# Patient Record
Sex: Male | Born: 1940 | Race: White | Hispanic: No | Marital: Married | Smoking: Former smoker
Health system: Southern US, Community
[De-identification: ages and names within clinical notes are randomized; demographics above are authoritative.]

## PROBLEM LIST (undated history)

## (undated) DIAGNOSIS — C3491 Malignant neoplasm of unspecified part of right bronchus or lung: Secondary | ICD-10-CM

## (undated) DIAGNOSIS — C801 Malignant (primary) neoplasm, unspecified: Secondary | ICD-10-CM

## (undated) DIAGNOSIS — I4819 Other persistent atrial fibrillation: Secondary | ICD-10-CM

## (undated) DIAGNOSIS — T85528A Displacement of other gastrointestinal prosthetic devices, implants and grafts, initial encounter: Secondary | ICD-10-CM

## (undated) DIAGNOSIS — M1990 Unspecified osteoarthritis, unspecified site: Secondary | ICD-10-CM

## (undated) DIAGNOSIS — I471 Supraventricular tachycardia, unspecified: Secondary | ICD-10-CM

## (undated) DIAGNOSIS — I10 Essential (primary) hypertension: Secondary | ICD-10-CM

## (undated) DIAGNOSIS — I509 Heart failure, unspecified: Secondary | ICD-10-CM

## (undated) DIAGNOSIS — R269 Unspecified abnormalities of gait and mobility: Secondary | ICD-10-CM

## (undated) DIAGNOSIS — F329 Major depressive disorder, single episode, unspecified: Secondary | ICD-10-CM

## (undated) DIAGNOSIS — F32A Depression, unspecified: Secondary | ICD-10-CM

## (undated) DIAGNOSIS — I6509 Occlusion and stenosis of unspecified vertebral artery: Secondary | ICD-10-CM

## (undated) DIAGNOSIS — I639 Cerebral infarction, unspecified: Secondary | ICD-10-CM

## (undated) DIAGNOSIS — I2510 Atherosclerotic heart disease of native coronary artery without angina pectoris: Secondary | ICD-10-CM

## (undated) DIAGNOSIS — K589 Irritable bowel syndrome without diarrhea: Secondary | ICD-10-CM

## (undated) DIAGNOSIS — I499 Cardiac arrhythmia, unspecified: Secondary | ICD-10-CM

## (undated) HISTORY — DX: Unspecified abnormalities of gait and mobility: R26.9

## (undated) HISTORY — PX: EYE SURGERY: SHX253

## (undated) HISTORY — DX: Supraventricular tachycardia: I47.1

## (undated) HISTORY — DX: Supraventricular tachycardia, unspecified: I47.10

## (undated) HISTORY — DX: Malignant neoplasm of unspecified part of right bronchus or lung: C34.91

## (undated) HISTORY — PX: CARDIAC CATHETERIZATION: SHX172

## (undated) HISTORY — DX: Occlusion and stenosis of unspecified vertebral artery: I65.09

## (undated) HISTORY — PX: UVULOPALATOPHARYNGOPLASTY: SHX827

## (undated) HISTORY — DX: Cerebral infarction, unspecified: I63.9

## (undated) HISTORY — PX: CATARACT EXTRACTION, BILATERAL: SHX1313

## (undated) HISTORY — PX: NASAL SINUS SURGERY: SHX719

---

## 2000-03-12 HISTORY — PX: FOOT NEUROMA SURGERY: SHX646

## 2003-08-20 ENCOUNTER — Emergency Department (HOSPITAL_COMMUNITY): Admission: EM | Admit: 2003-08-20 | Discharge: 2003-08-20 | Payer: Self-pay | Admitting: Emergency Medicine

## 2004-03-21 ENCOUNTER — Encounter: Admission: RE | Admit: 2004-03-21 | Discharge: 2004-03-21 | Payer: Self-pay | Admitting: Emergency Medicine

## 2004-03-24 ENCOUNTER — Encounter: Admission: RE | Admit: 2004-03-24 | Discharge: 2004-03-24 | Payer: Self-pay | Admitting: Emergency Medicine

## 2004-04-27 ENCOUNTER — Ambulatory Visit (HOSPITAL_COMMUNITY): Admission: RE | Admit: 2004-04-27 | Discharge: 2004-04-27 | Payer: Self-pay | Admitting: Orthopedic Surgery

## 2004-04-27 ENCOUNTER — Ambulatory Visit (HOSPITAL_BASED_OUTPATIENT_CLINIC_OR_DEPARTMENT_OTHER): Admission: RE | Admit: 2004-04-27 | Discharge: 2004-04-27 | Payer: Self-pay | Admitting: Orthopedic Surgery

## 2004-10-02 ENCOUNTER — Encounter: Admission: RE | Admit: 2004-10-02 | Discharge: 2004-10-02 | Payer: Self-pay | Admitting: Emergency Medicine

## 2005-03-23 ENCOUNTER — Inpatient Hospital Stay (HOSPITAL_COMMUNITY): Admission: RE | Admit: 2005-03-23 | Discharge: 2005-03-24 | Payer: Self-pay | Admitting: Orthopedic Surgery

## 2005-06-25 ENCOUNTER — Ambulatory Visit: Payer: Self-pay | Admitting: Gastroenterology

## 2005-07-05 ENCOUNTER — Encounter (INDEPENDENT_AMBULATORY_CARE_PROVIDER_SITE_OTHER): Payer: Self-pay | Admitting: *Deleted

## 2005-07-05 ENCOUNTER — Ambulatory Visit: Payer: Self-pay | Admitting: Gastroenterology

## 2007-10-02 ENCOUNTER — Ambulatory Visit (HOSPITAL_BASED_OUTPATIENT_CLINIC_OR_DEPARTMENT_OTHER): Admission: RE | Admit: 2007-10-02 | Discharge: 2007-10-02 | Payer: Self-pay | Admitting: Orthopedic Surgery

## 2007-11-27 ENCOUNTER — Encounter: Admission: RE | Admit: 2007-11-27 | Discharge: 2007-11-27 | Payer: Self-pay | Admitting: Emergency Medicine

## 2008-04-28 ENCOUNTER — Encounter (INDEPENDENT_AMBULATORY_CARE_PROVIDER_SITE_OTHER): Payer: Self-pay | Admitting: *Deleted

## 2008-06-02 ENCOUNTER — Ambulatory Visit: Payer: Self-pay | Admitting: Gastroenterology

## 2008-06-15 ENCOUNTER — Encounter: Payer: Self-pay | Admitting: Gastroenterology

## 2008-06-15 ENCOUNTER — Ambulatory Visit: Payer: Self-pay | Admitting: Gastroenterology

## 2008-06-16 ENCOUNTER — Encounter: Payer: Self-pay | Admitting: Gastroenterology

## 2008-09-23 ENCOUNTER — Encounter: Admission: RE | Admit: 2008-09-23 | Discharge: 2008-09-23 | Payer: Self-pay | Admitting: Emergency Medicine

## 2008-10-04 ENCOUNTER — Encounter: Admission: RE | Admit: 2008-10-04 | Discharge: 2008-10-04 | Payer: Self-pay | Admitting: Emergency Medicine

## 2008-10-12 ENCOUNTER — Ambulatory Visit: Payer: Self-pay

## 2008-10-12 ENCOUNTER — Encounter (INDEPENDENT_AMBULATORY_CARE_PROVIDER_SITE_OTHER): Payer: Self-pay | Admitting: Emergency Medicine

## 2008-10-13 ENCOUNTER — Ambulatory Visit: Payer: Self-pay | Admitting: Cardiovascular Disease

## 2008-10-13 ENCOUNTER — Encounter (INDEPENDENT_AMBULATORY_CARE_PROVIDER_SITE_OTHER): Payer: Self-pay | Admitting: *Deleted

## 2008-10-13 DIAGNOSIS — R9389 Abnormal findings on diagnostic imaging of other specified body structures: Secondary | ICD-10-CM

## 2008-10-13 DIAGNOSIS — R9431 Abnormal electrocardiogram [ECG] [EKG]: Secondary | ICD-10-CM | POA: Insufficient documentation

## 2008-10-13 DIAGNOSIS — F341 Dysthymic disorder: Secondary | ICD-10-CM | POA: Insufficient documentation

## 2008-10-13 LAB — CONVERTED CEMR LAB
Basophils Absolute: 0.1 10*3/uL (ref 0.0–0.1)
CO2: 31 meq/L (ref 19–32)
Calcium: 9.2 mg/dL (ref 8.4–10.5)
Creatinine, Ser: 0.8 mg/dL (ref 0.4–1.5)
Eosinophils Absolute: 1.1 10*3/uL — ABNORMAL HIGH (ref 0.0–0.7)
GFR calc non Af Amer: 102.24 mL/min (ref >60)
Glucose, Bld: 106 mg/dL — ABNORMAL HIGH (ref 70–99)
INR: 1 (ref 0.8–1.0)
Lymphocytes Relative: 23.6 % (ref 12.0–46.0)
MCHC: 33.6 g/dL (ref 30.0–36.0)
Monocytes Relative: 8.2 % (ref 3.0–12.0)
Neutrophils Relative %: 53.8 % (ref 43.0–77.0)
Platelets: 244 10*3/uL (ref 150.0–400.0)
Prothrombin Time: 10.5 s (ref 9.1–11.7)
RDW: 12.7 % (ref 11.5–14.6)
Sodium: 141 meq/L (ref 135–145)
aPTT: 27.5 s (ref 21.7–28.8)

## 2008-10-14 ENCOUNTER — Ambulatory Visit: Payer: Self-pay | Admitting: Cardiovascular Disease

## 2008-10-14 ENCOUNTER — Inpatient Hospital Stay (HOSPITAL_BASED_OUTPATIENT_CLINIC_OR_DEPARTMENT_OTHER): Admission: RE | Admit: 2008-10-14 | Discharge: 2008-10-14 | Payer: Self-pay | Admitting: Cardiovascular Disease

## 2008-10-26 ENCOUNTER — Telehealth (INDEPENDENT_AMBULATORY_CARE_PROVIDER_SITE_OTHER): Payer: Self-pay | Admitting: *Deleted

## 2008-11-03 DIAGNOSIS — F3289 Other specified depressive episodes: Secondary | ICD-10-CM | POA: Insufficient documentation

## 2008-11-03 DIAGNOSIS — F329 Major depressive disorder, single episode, unspecified: Secondary | ICD-10-CM | POA: Insufficient documentation

## 2008-11-04 ENCOUNTER — Ambulatory Visit: Payer: Self-pay | Admitting: Cardiovascular Disease

## 2008-11-04 DIAGNOSIS — I2510 Atherosclerotic heart disease of native coronary artery without angina pectoris: Secondary | ICD-10-CM | POA: Insufficient documentation

## 2009-03-17 ENCOUNTER — Encounter (INDEPENDENT_AMBULATORY_CARE_PROVIDER_SITE_OTHER): Payer: Self-pay | Admitting: *Deleted

## 2010-04-11 NOTE — Letter (Signed)
Summary: Appointment - Reminder 2  [employer], Main Office  [address]   [place], [place] [address]   Phone: [phone]  Fax: [phone]     March 17, 2009 MRN: [id number]   George Vega [address] [place], [place]  [address]   Dear George Vega,  Our records indicate that it is time to schedule a follow-up appointment with Dr. Eden Emms. It is very important that we reach you to schedule this appointment. We look forward to participating in your health care needs. Please contact us at the number listed above at your earliest convenience to schedule your appointment.  If you are unable to make an appointment at this time, give [place] a call so we can update our records.  Sincerely,   Migdalia Dk [facility] Scheduling Team

## 2010-07-25 NOTE — Op Note (Signed)
NAME:  George Vega, George Vega              ACCOUNT NO.:  [id number]   MEDICAL RECORD NO.:  [id number]          PATIENT TYPE:  AMB   LOCATION:  DSC                          FACILITY:  [facility]   PHYSICIAN:  Loreta Ave, M.D. 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   POSTOPERATIVE DIAGNOSES:  Left knee complex tearing medial greater than  lateral meniscus.  Grade 2 and 3 changes tricompartmental most marked  patellofemoral joint with chondral loose bodies.   PROCEDURE:  Left knee exam under anesthesia, arthroscopy, chondroplasty  primarily patellofemoral joint, removal of chondral loose bodies.  Partial medial and lateral meniscectomy.   SURGEON:  Loreta Ave, MD   ASSISTANT:  Genene Churn. Barry Dienes, [place]   ANESTHESIA:  Knee block with sedation.   SPECIMENS:  None.   CULTURES:  None.   COMPLICATIONS:  None.   DRESSINGS:  Soft compressive.   PROCEDURE:  The patient was brought to the operating room, placed on the  operating table in supine position.  After adequate anesthesia had been  obtained, knee examined.  Good motion and good stability, patellofemoral  crepitus, positive medial McMurray, stable ligaments.  Tourniquet leg  holder applied.  Leg prepped and draped in the usual sterile fashion.  Three portals were created, one superolateral, one each medial and  lateral parapatellar.  Inflow catheter induced.  The standard  arthroscope was induced and knee inspected.  Good patellofemoral  tracking, but diffuse grade 3 changes throughout the patella and grade 2  on the trochlea.  Chondroplasty to a stable surface.  Loose bodies  removed.  Cruciate ligaments intact.  The medial compartment grade 2  changes mostly on the condyle.  Debrided.  Complex tearing of posterior  horn of the medial meniscus.  Posterior horn  removed, tapered into  remaining meniscus.  On the lateral side, there was some degenerative  tearing of posterior horn lateral meniscus.  Saucerized out to a stable  rim, tapered in smoothly.  Grade 2 changes on the plateau debrided.  Entire knee  examined.  No other findings appreciated.  Instruments and fluid  removed.  Portals and knee injected with Marcaine.  Portals closed with  4-0 nylon.  Sterile compressive dressing applied.  Anesthesia reversed.  Brought to the recovery room.  He tolerated his surgery well.  No  complications.      Loreta Ave, M.D.  Electronically Signed     DFM/MEDQ  D:  10/02/2007  T:  10/03/2007  Job:  [id number]

## 2010-07-25 NOTE — Cardiovascular Report (Signed)
NAME:  George Vega, George Vega              ACCOUNT NO.:  [id number]   MEDICAL RECORD NO.:  [id number]          PATIENT TYPE:  OIB   LOCATION:  1961                         FACILITY:  [facility]   PHYSICIAN:  Peter C. Eden Emms, MD, FACCDATE OF BIRTH:  17-Jul-1940   DATE OF PROCEDURE:  DATE OF DISCHARGE:                            CARDIAC CATHETERIZATION   A 70 year old patient with positive stress echo suggesting LAD ischemia.   Cine catheterization done with 4-French catheters from right femoral  artery.   The distal left main coronary artery had a 30% discrete stenosis.   The left anterior descending artery was 100% occluded in the midvessel,  it was 100% chronically occluded just after the takeoff of the first  diagonal and first septal perforator.  First diagonal branch had 20%  multiple discrete lesions.   Circumflex coronary artery had a 50% ostial lesion, subsequently there  was a 30% to 40% discrete lesion in the proximal vessel.  The OM  consisted primarily of a large obtuse marginal branch, which was normal.   The right coronary artery was large.  There was 20% multiple lesions  proximally.  Mid distal vessel were normal.  Posterior lateral branch  and PDA were normal.  There was a very well formed collaterals to the  distal, mid, and proximal LAD from the PDA.   RAO ventriculography.  RAO ventriculography was normal.  EF was 55% to  60%.  There was no regional wall motion abnormality.  Blood pressure was  145/80.  LV pressure was 145/12.   IMPRESSION:  The patient has a chronically occluded LAD with normal LV  function and no evidence of angina.  His positive stress echo would  indicate a collateralized LAD, I think this is a stable situation.  I  did not think that the ostial circ was flow-limiting.  I believe medical  therapy is warranted including aspirin and beta-blocker therapy.      Noralyn Pick. Eden Emms, MD, [facility]  Electronically Signed     PCN/MEDQ  D:  10/14/2008  T:   10/14/2008  Job:  [id number]   cc:   Reuben Likes, M.D.

## 2010-07-28 NOTE — Op Note (Signed)
NAMEJIOVANI, George Vega              ACCOUNT NO.:  [id number]   MEDICAL RECORD NO.:  [id number]          PATIENT TYPE:  INP   LOCATION:  5015                         FACILITY:  [facility]   PHYSICIAN:  Loreta Ave, M.D. DATE OF BIRTH:  1940-06-18   DATE OF PROCEDURE:  03/23/2005  DATE OF DISCHARGE:                                 OPERATIVE REPORT   PREOPERATIVE DIAGNOSIS:  End stage degenerative arthritis, left shoulder.   POSTOPERATIVE DIAGNOSIS:  End stage degenerative arthritis, left shoulder.   OPERATIVE PROCEDURE:  Total shoulder replacement, left shoulder, utilizing  Stryker Osteonix prosthesis, cemented pegged number 7 glenoid component,  press fit number 16 HA coated stem with a 50 by 18 mm 4 mm offset humeral  head.   SURGEON:  Loreta Ave, M.D.   ASSISTANT:   ANESTHESIA:  General.   ESTIMATED BLOOD LOSS:  Minimal.   SPECIMENS:  None.   CULTURES:  None.   COMPLICATIONS:  None.   DRESSINGS:  Soft compressive with shoulder immobilizer.   PROCEDURE:  The patient was brought to the operating room and after adequate  anesthesia had been obtained, placed in a beach chair position on the  shoulder positioner, prepped and draped in the usual sterile fashion.  Very  good motion, good stability.  Incision along the deltopectoral interval from  the coracoid distally.  Skin and subcutaneous tissue divided.  Blunt  dissection used to develop the deltopectoral interval preserving the  cephalic vein.  The front of the shoulder exposed.  The subscap tendon was  taken down, tagged with Ethibond, exposing the shoulder.  Grade 4 changes  throughout.  Humeral head was cut at the anatomic neck at 30 degrees of  retroversion protecting the rotator cuff, biceps tendon, capsule and  ligamentous structures.  The shoulder exposed.  Some periarticular spurs  removed.  Glenoid inspected, also with some focal grade 4 changes.  Glenoid  prepared with sizing for a number 7 component.   The reamer was then used to  ream down to good, bleeding bone restoring normal position of the glenoid  which had eroded slightly posteriorly.  After sizing for the number 7  component, the jigs were put in place, the drill holes made for the PEG  component.  Copious irrigation.  Cement prepared and placed on the peg  component which was firmly cemented with excellent positioning and coverage  of the glenoid with a number 7 component.  After the cement hardened,  attention was turned to the humerus.  Hand held reamer was used to open up  the canal and sized for a number 16 mm component restoring 30 degrees of  retroversion.  After appropriate trials, a 50 mm by 18 mm head was chosen.  Utilizing the 4 mm offset, I could center the head perfectly over the top of  the humerus.  The trials were removed.  Definitive component was then seated  down in the humerus restoring good retroversion and seating.  The head was  then attached with the 4 mm offset placed so I had an excellent coverage of  the head.  This was inspected with good coverage of the head throughout,  good positioning of the humeral head, and good restoration of the anatomy.  The shoulder reduced.  Full passive motion and excellent stability.  Biceps  tendon and rotator cuff intact.  Copious irrigation.  Subscap was repaired  anatomically with Ethibond.  The deltopectoral interval closed with 0  Vicryl.  The skin and subcutaneous tissue with Vicryl and then staples.  Sterile compressive dressing applied.  Shoulder immobilizer applied.  Anesthesia reversed.  Brought to the recovery room.  Tolerated the surgery  well without complications.      Loreta Ave, M.D.  Electronically Signed     DFM/MEDQ  D:  03/23/2005  T:  03/24/2005  Job:  [id number]

## 2010-07-28 NOTE — Op Note (Signed)
NAME:  George Vega, George Vega              ACCOUNT NO.:  [id number]   MEDICAL RECORD NO.:  [id number]          PATIENT TYPE:  AMB   LOCATION:  DSC                          FACILITY:  [facility]   PHYSICIAN:  Loreta Ave, M.D. 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   POSTOPERATIVE DIAGNOSES:  Persistent impingement, left shoulder with marked  distal clavicle osteolysis and partial tearing of rotator cuff after  previous arthroscopy in 2004 with focal grade 4 chondral lesion central  portion of the humeral head with chondral loose bodies.   PROCEDURE:  1.  Left shoulder exam under anesthesia, arthroscopy with chondroplasty of      humeral head and removal of chondral loose bodies.  2.  Subacromial decompression with debridement of the cuff, resection of      bursa and adhesions.  3.  Revision acromioplasty acromion and release of coracoacromial ligament.  4.  Resection of periarticular spurs distal clavicle.   SURGEON:  Loreta Ave, M.D.   ASSISTANT:  Genene Churn. Denton Meek.   ANESTHESIA:  General.   BLOOD LOSS:  Minimal.   SPECIMENS:  None.   CULTURES:  None.   COMPLICATIONS:  None.   DRESSINGS:  Soft compression dressing.   DESCRIPTION OF PROCEDURE:  The patient brought to the operating room and  after adequate anesthesia had been obtained, the left shoulder was examined.  Full motion good stability, no real gradient or crepitus.  Placed in beach-  chair position on McConnell positioner and prepped and draped in usual  sterile fashion.  Three standard portals, anterior, posterior and lateral.  Shoulder was entered with blunt obturator __________ and inspected.  Noted  to have significant chondral loose bodies  throughout, all debrided.  These  were coming from a focal grade 4 lesion right in the center of the humeral  head.  That was debrided to a stable surface.  This was very focal, about  1.5 cm in diameter, but remaining articular cartilage looked excellent.  Biceps tendon, biceps anchor, labrum intact.  All recesses examined, all  loose fragments removed.  Undersurface of rotator cuff looked good.  Cannula  redirected subacromially.  Persistent impingement with fibrillated abrasive  tearing supraspinatus, especially anterior half.  Treated with debridement  to a stable surface.  Although a lot of attrition and abrasion, no full-  thickness tears.  Persistent spurring in the front of the acromion treated  with revision acromioplasty with release of CA ligament with cautery and use  of high-speed bur to get a nice upsloping on top of the acromion.  Distal  clavicle grade 4 changes with marked osteophytes and some periarticular  spurs.  All spurs removed.  Resection of the lateral 1 cm of the clavicle.  At completion, adequacy of decompression and clavicle excision confirmed  viewing from all portals.  Thorough assessment of the cuff  throughout.  Instruments and fluid removed.  Portals in shoulder injected with Marcaine.  Portals closed with 4-0 nylon.  Sterile compression dressing applied.  Anesthesia reversed.  Brought to the recovery room.  Tolerated surgery well,  no complications.      DFM/MEDQ  D:  04/27/2004  T:  04/27/2004  Job:  [id number]

## 2011-02-09 ENCOUNTER — Other Ambulatory Visit: Payer: Self-pay | Admitting: Orthopedic Surgery

## 2011-02-09 DIAGNOSIS — M25511 Pain in right shoulder: Secondary | ICD-10-CM

## 2011-02-13 ENCOUNTER — Ambulatory Visit
Admission: RE | Admit: 2011-02-13 | Discharge: 2011-02-13 | Disposition: A | Discharge status: Home / Self Care | Payer: Medicare Other | Source: Ambulatory Visit | Attending: Orthopedic Surgery | Admitting: Orthopedic Surgery

## 2011-02-13 DIAGNOSIS — M25511 Pain in right shoulder: Secondary | ICD-10-CM

## 2011-02-26 NOTE — H&P (Signed)
George Vega/[facility] [address]   [address] [place], [place] [address] [phone] A Division of [facility]  George Vega, M.D.     George Vega, M.D.     George Vega, M.D. George Vega, M.D.    George Vega, M.D. George Vega, M.D. George Vega, D.O.          George Vega. George Dienes, PA-C            George A. Shepperson, PA-C George Vega, OPA-C   RE: George Vega, George Vega   [id number]      DOB: 06/30/1940 PROGRESS NOTE: 02-09-11 George Vega comes in for his right shoulder. Old patient of mine. Right shoulder symptoms for 2 years. Getting steadily worse. Insidious onset. Progressed to a point that he can no longer do anything overhead. Rest pain and night pain marked functional impact. He's given this time, rest, alteration of activity and antiinflammatories without improvement. He comes in for evaluation and treatment recommendation. No instability. Past medical history: reviewed included in the chart. Significant for marked issues in his left shoulder initially impingement treated with arthroscopic decompression and attendant end stage degenerative arthritis treated with total shoulder replacement  by me 03/23/05. Excellent results with full function full motion resolution of symptoms and doing well. He's been seen by me for arthroscopic debridement meniscus tears left knee 10/02/07. Resolution of symptoms there doing well. He's followed medically by Dr. Leslee Vega. He's on aspirin and Plavix for cardiac issues. Cortisone injection to his left shoulder and left knee never gave him long-term relief. General exam is outlined included in the chart.   EXAMINATION: Healthy appearing 70 year old in no acute distress. Normal gait and stance. Right shoulder has just about full motion passively a little less actively. Positive impingement positive palms down abduction. AC soreness. Pain with crossed adduction. Biceps intact. No  apprehension or instability. No significant rotator cuff atrophy. Left shoulder has full motion no pain no tenderness no impingement.  X-RAYS: 3 views right shoulder shows type II to III acromion degenerative changes AC joint. The glenohumeral joint and subacromial space look good. He does not have near the arthritis in the shoulder that he had on the other side.  DISPOSITION: Progressive impingement right shoulder. Present for 2 years. Could have progression to an attritional rotator cuff tear. Talked about options. Given that he's had symptoms for 2 years we'll obtain an MRI to outline pathology. We talked about subacromial decompression versus combination of that with rotator cuff repair depending on what the scan shows. He understands and agrees.  I spent more than 25 minutes with him covering all these issues with him. All paperwork complete and questions answered. If we proceed with surgery we'll need clearance from Dr. Lorenz Vega and he'll need to be off Plavix.  George Vega, M.D.  Electronically verified by George Vega, M.D. DFM:kh cc:  George Home, MD fax [phone]  D 02-09-11 T 02-12-11  George Vega/[facility] [address]   [address] [place], [place] [address] [phone] A Division of [facility]  George Vega, M.D.     George Vega, M.D.     George Vega, M.D. George Vega, M.D.    George Vega, M.D. George Vega, M.D. George Vega, D.O.          George Vega. George Dienes, PA-C            George A. Shepperson,  PA-C George Vega, OPA-C   RE: George Vega, George Vega   [id number]      DOB: 02-12-1941 PROGRESS NOTE: 02-16-11 I spoke with George Vega about his right shoulder MRI performed 12/4. This showed intact rotator cuff hypertrophy AC joint which could predispose to impingement and moderate osteoarthritic changes in the glenoid with debris in the joint fluid. He's scheduled for right shoulder arthroscopy and  we'll proceed as scheduled.  George Vega, M.D.  Electronically verified by George Vega, M.D. DFM(JMO):kh D 02-19-11 T 02-19-11

## 2011-02-28 ENCOUNTER — Encounter (HOSPITAL_BASED_OUTPATIENT_CLINIC_OR_DEPARTMENT_OTHER): Payer: Self-pay | Admitting: *Deleted

## 2011-02-28 NOTE — Progress Notes (Signed)
Pt has chronic sinus problems-started on antibiotic preop to prevent any problems for surg No cardiac meds -denies any resp problems

## 2011-03-01 ENCOUNTER — Encounter (HOSPITAL_BASED_OUTPATIENT_CLINIC_OR_DEPARTMENT_OTHER): Payer: Self-pay | Admitting: *Deleted

## 2011-03-01 ENCOUNTER — Encounter (HOSPITAL_BASED_OUTPATIENT_CLINIC_OR_DEPARTMENT_OTHER)
Admission: RE | Disposition: A | Discharge status: Home / Self Care | Payer: Self-pay | Source: Ambulatory Visit | Attending: Orthopedic Surgery

## 2011-03-01 ENCOUNTER — Ambulatory Visit (HOSPITAL_BASED_OUTPATIENT_CLINIC_OR_DEPARTMENT_OTHER): Payer: Medicare Other | Admitting: Anesthesiology

## 2011-03-01 ENCOUNTER — Encounter (HOSPITAL_BASED_OUTPATIENT_CLINIC_OR_DEPARTMENT_OTHER): Payer: Self-pay | Admitting: Anesthesiology

## 2011-03-01 ENCOUNTER — Ambulatory Visit (HOSPITAL_BASED_OUTPATIENT_CLINIC_OR_DEPARTMENT_OTHER)
Admission: RE | Admit: 2011-03-01 | Discharge: 2011-03-01 | Disposition: A | Discharge status: Home / Self Care | Payer: Medicare Other | Source: Ambulatory Visit | Attending: Orthopedic Surgery | Admitting: Orthopedic Surgery

## 2011-03-01 DIAGNOSIS — M25819 Other specified joint disorders, unspecified shoulder: Secondary | ICD-10-CM | POA: Insufficient documentation

## 2011-03-01 DIAGNOSIS — M19019 Primary osteoarthritis, unspecified shoulder: Secondary | ICD-10-CM | POA: Insufficient documentation

## 2011-03-01 DIAGNOSIS — Z01812 Encounter for preprocedural laboratory examination: Secondary | ICD-10-CM | POA: Insufficient documentation

## 2011-03-01 DIAGNOSIS — I2510 Atherosclerotic heart disease of native coronary artery without angina pectoris: Secondary | ICD-10-CM | POA: Insufficient documentation

## 2011-03-01 DIAGNOSIS — Z4789 Encounter for other orthopedic aftercare: Secondary | ICD-10-CM

## 2011-03-01 HISTORY — DX: Irritable bowel syndrome, unspecified: K58.9

## 2011-03-01 HISTORY — PX: SHOULDER ARTHROSCOPY: SHX128

## 2011-03-01 HISTORY — DX: Unspecified osteoarthritis, unspecified site: M19.90

## 2011-03-01 SURGERY — ARTHROSCOPY, SHOULDER
Anesthesia: General | Site: Shoulder | Laterality: Right | Wound class: Clean

## 2011-03-01 MED ORDER — LACTATED RINGERS IV SOLN
INTRAVENOUS | Status: DC
  Administered 2011-03-01 (×3): via INTRAVENOUS

## 2011-03-01 MED ORDER — LIDOCAINE HCL (CARDIAC) 20 MG/ML IV SOLN
INTRAVENOUS | Status: DC | PRN
  Administered 2011-03-01: 100 mg via INTRAVENOUS

## 2011-03-01 MED ORDER — FENTANYL CITRATE 0.05 MG/ML IJ SOLN
50.0000 ug | INTRAMUSCULAR | Status: DC | PRN
  Administered 2011-03-01: 100 ug via INTRAVENOUS

## 2011-03-01 MED ORDER — SUCCINYLCHOLINE CHLORIDE 20 MG/ML IJ SOLN
INTRAMUSCULAR | Status: DC | PRN
  Administered 2011-03-01: 100 mg via INTRAVENOUS

## 2011-03-01 MED ORDER — HYDROMORPHONE HCL PF 1 MG/ML IJ SOLN
0.2500 mg | INTRAMUSCULAR | Status: DC | PRN
  Administered 2011-03-01: 0.5 mg via INTRAVENOUS

## 2011-03-01 MED ORDER — CEFAZOLIN SODIUM-DEXTROSE 2-3 GM-% IV SOLR
2.0000 g | INTRAVENOUS | Status: AC
  Administered 2011-03-01: 2 g via INTRAVENOUS

## 2011-03-01 MED ORDER — MEPERIDINE HCL 25 MG/ML IJ SOLN: 6.2500 mg | INTRAMUSCULAR | Status: DC | PRN

## 2011-03-01 MED ORDER — FENTANYL CITRATE 0.05 MG/ML IJ SOLN
INTRAMUSCULAR | Status: DC | PRN
  Administered 2011-03-01 (×2): 50 ug via INTRAVENOUS

## 2011-03-01 MED ORDER — SODIUM CHLORIDE 0.9 % IR SOLN
Status: DC | PRN
  Administered 2011-03-01: 3000 mL

## 2011-03-01 MED ORDER — DEXAMETHASONE SODIUM PHOSPHATE 4 MG/ML IJ SOLN
INTRAMUSCULAR | Status: DC | PRN
  Administered 2011-03-01: 10 mg via INTRAVENOUS

## 2011-03-01 MED ORDER — BUPIVACAINE-EPINEPHRINE PF 0.25-1:200000 % IJ SOLN
INTRAMUSCULAR | Status: DC | PRN
  Administered 2011-03-01: 25 mL

## 2011-03-01 MED ORDER — PROPOFOL 10 MG/ML IV EMUL
INTRAVENOUS | Status: DC | PRN
  Administered 2011-03-01: 200 mg via INTRAVENOUS

## 2011-03-01 MED ORDER — LABETALOL HCL 5 MG/ML IV SOLN
10.0000 mg | INTRAVENOUS | Status: DC | PRN
  Administered 2011-03-01: 10 mg via INTRAVENOUS

## 2011-03-01 MED ORDER — MIDAZOLAM HCL 2 MG/2ML IJ SOLN
1.0000 mg | INTRAMUSCULAR | Status: DC | PRN
  Administered 2011-03-01: 2 mg via INTRAVENOUS

## 2011-03-01 MED ORDER — ONDANSETRON HCL 4 MG/2ML IJ SOLN
INTRAMUSCULAR | Status: DC | PRN
  Administered 2011-03-01: 4 mg via INTRAVENOUS

## 2011-03-01 MED ORDER — PROMETHAZINE HCL 25 MG/ML IJ SOLN: 6.2500 mg | INTRAMUSCULAR | Status: DC | PRN

## 2011-03-01 MED ORDER — CEFAZOLIN SODIUM 1-5 GM-% IV SOLN: 1.0000 g | INTRAVENOUS | Status: DC

## 2011-03-01 MED ORDER — EPHEDRINE SULFATE 50 MG/ML IJ SOLN
INTRAMUSCULAR | Status: DC | PRN
  Administered 2011-03-01: 10 mg via INTRAVENOUS
  Administered 2011-03-01: 5 mg via INTRAVENOUS
  Administered 2011-03-01: 10 mg via INTRAVENOUS

## 2011-03-01 MED ORDER — CHLORHEXIDINE GLUCONATE 4 % EX LIQD: 60.0000 mL | Freq: Once | CUTANEOUS | Status: DC

## 2011-03-01 SURGICAL SUPPLY — 74 items
APL SKNCLS STERI-STRIP NONHPOA (GAUZE/BANDAGES/DRESSINGS)
BENZOIN TINCTURE PRP APPL 2/3 (GAUZE/BANDAGES/DRESSINGS) IMPLANT
BLADE CUTTER GATOR 3.5 (BLADE) ×3 IMPLANT
BLADE CUTTER MENIS 5.5 (BLADE) IMPLANT
BLADE GREAT WHITE 4.2 (BLADE) ×3 IMPLANT
BLADE SURG 15 STRL LF DISP TIS (BLADE) IMPLANT
BLADE SURG 15 STRL SS (BLADE)
BUR OVAL 6.0 (BURR) ×3 IMPLANT
CANISTER OMNI JUG 16 LITER (MISCELLANEOUS) ×3 IMPLANT
CANISTER SUCTION 2500CC (MISCELLANEOUS) IMPLANT
CANNULA TWIST IN 8.25X7CM (CANNULA) IMPLANT
CLOTH BEACON ORANGE TIMEOUT ST (SAFETY) ×3 IMPLANT
DECANTER SPIKE VIAL GLASS SM (MISCELLANEOUS) IMPLANT
DRAPE OEC MINIVIEW 54X84 (DRAPES) IMPLANT
DRAPE STERI 35X30 U-POUCH (DRAPES) ×3 IMPLANT
DRAPE U-SHAPE 47X51 STRL (DRAPES) ×3 IMPLANT
DRAPE U-SHAPE 76X120 STRL (DRAPES) ×6 IMPLANT
DRSG PAD ABDOMINAL 8X10 ST (GAUZE/BANDAGES/DRESSINGS) ×3 IMPLANT
DURAPREP 26ML APPLICATOR (WOUND CARE) ×3 IMPLANT
ELECT MENISCUS 165MM 90D (ELECTRODE) ×3 IMPLANT
ELECT NDL TIP 2.8 STRL (NEEDLE) IMPLANT
ELECT NEEDLE TIP 2.8 STRL (NEEDLE) IMPLANT
ELECT REM PT RETURN 9FT ADLT (ELECTROSURGICAL) ×3
ELECTRODE REM PT RTRN 9FT ADLT (ELECTROSURGICAL) ×2 IMPLANT
GAUZE XEROFORM 1X8 LF (GAUZE/BANDAGES/DRESSINGS) ×3 IMPLANT
GLOVE BIO SURGEON STRL SZ 6.5 (GLOVE) ×2 IMPLANT
GLOVE BIOGEL PI IND STRL 7.0 (GLOVE) ×1 IMPLANT
GLOVE BIOGEL PI IND STRL 8 (GLOVE) ×2 IMPLANT
GLOVE BIOGEL PI INDICATOR 7.0 (GLOVE) ×1
GLOVE BIOGEL PI INDICATOR 8 (GLOVE) ×1
GLOVE ORTHO TXT STRL SZ7.5 (GLOVE) ×6 IMPLANT
GOWN BRE IMP PREV XXLGXLNG (GOWN DISPOSABLE) ×3 IMPLANT
GOWN PREVENTION PLUS XLARGE (GOWN DISPOSABLE) ×6 IMPLANT
KIT BIO-TENODESIS 3X8 DISP (MISCELLANEOUS)
KIT INSRT BABSR STRL DISP BTN (MISCELLANEOUS) IMPLANT
NDL SCORPION MULTI FIRE (NEEDLE) IMPLANT
NDL SUT 6 .5 CRC .975X.05 MAYO (NEEDLE) IMPLANT
NEEDLE MAYO TAPER (NEEDLE)
NEEDLE SCORPION MULTI FIRE (NEEDLE) IMPLANT
NS IRRIG 1000ML POUR BTL (IV SOLUTION) IMPLANT
PACK ARTHROSCOPY DSU (CUSTOM PROCEDURE TRAY) ×3 IMPLANT
PACK BASIN DAY SURGERY FS (CUSTOM PROCEDURE TRAY) ×3 IMPLANT
PASSER SUT SWANSON 36MM LOOP (INSTRUMENTS) IMPLANT
PENCIL BUTTON HOLSTER BLD 10FT (ELECTRODE) ×3 IMPLANT
SET ARTHROSCOPY TUBING (MISCELLANEOUS) ×3
SET ARTHROSCOPY TUBING LN (MISCELLANEOUS) ×2 IMPLANT
SLEEVE SCD COMPRESS KNEE MED (MISCELLANEOUS) IMPLANT
SLING ARM FOAM STRAP LRG (SOFTGOODS) IMPLANT
SLING ARM FOAM STRAP MED (SOFTGOODS) IMPLANT
SLING ARM FOAM STRAP XLG (SOFTGOODS) ×2 IMPLANT
SLING ARM IMMOBILIZER LRG (SOFTGOODS) IMPLANT
SLING ARM IMMOBILIZER MED (SOFTGOODS) IMPLANT
SPONGE GAUZE 4X4 12PLY (GAUZE/BANDAGES/DRESSINGS) ×4 IMPLANT
SPONGE LAP 4X18 X RAY DECT (DISPOSABLE) IMPLANT
STRIP CLOSURE SKIN 1/2X4 (GAUZE/BANDAGES/DRESSINGS) IMPLANT
SUCTION FRAZIER TIP 10 FR DISP (SUCTIONS) IMPLANT
SUT ETHIBOND 2 OS 4 DA (SUTURE) IMPLANT
SUT ETHILON 2 0 FS 18 (SUTURE) IMPLANT
SUT ETHILON 3 0 PS 1 (SUTURE) ×2 IMPLANT
SUT FIBERWIRE #2 38 T-5 BLUE (SUTURE)
SUT RETRIEVER MED (INSTRUMENTS) IMPLANT
SUT STEEL 4 (SUTURE) IMPLANT
SUT STEEL 5 (SUTURE) IMPLANT
SUT TIGER TAPE 7 IN WHITE (SUTURE) IMPLANT
SUT VIC AB 0 CT1 27 (SUTURE)
SUT VIC AB 0 CT1 27XBRD ANBCTR (SUTURE) IMPLANT
SUT VIC AB 2-0 SH 27 (SUTURE)
SUT VIC AB 2-0 SH 27XBRD (SUTURE) IMPLANT
SUT VIC AB 3-0 FS2 27 (SUTURE) IMPLANT
SUTURE FIBERWR #2 38 T-5 BLUE (SUTURE) IMPLANT
TAPE FIBER 2MM 7IN #2 BLUE (SUTURE) IMPLANT
TOWEL OR 17X24 6PK STRL BLUE (TOWEL DISPOSABLE) ×3 IMPLANT
WATER STERILE IRR 1000ML POUR (IV SOLUTION) ×3 IMPLANT
YANKAUER SUCT BULB TIP NO VENT (SUCTIONS) IMPLANT

## 2011-03-01 NOTE — Transfer of Care (Signed)
Immediate Anesthesia Transfer of Care Note  Patient: George Vega  Procedure(s) Performed:  ARTHROSCOPY SHOULDER - arthroscopy shoulder decompression subacromial partial acromioplasty with coracoacromial release, distal claviculectomy, debridement of labrium  Patient Location: PACU  Anesthesia Type: General  Level of Consciousness: sedated  Airway & Oxygen Therapy: Patient Spontanous Breathing and Patient connected to face mask oxygen  Post-op Assessment: Report given to PACU RN and Post -op Vital signs reviewed and stable  Post vital signs: Reviewed and stable  Complications: No apparent anesthesia complications

## 2011-03-01 NOTE — Anesthesia Postprocedure Evaluation (Signed)
Anesthesia Post Note  Patient: George Vega  Procedure(s) Performed:  ARTHROSCOPY SHOULDER - arthroscopy shoulder decompression subacromial partial acromioplasty with coracoacromial release, distal claviculectomy, debridement of labrium  Anesthesia type: General  Patient location: PACU  Post pain: Pain level controlled and Adequate analgesia  Post assessment: Post-op Vital signs reviewed, Patient's Cardiovascular Status Stable, Respiratory Function Stable, Patent Airway and Pain level controlled  Last Vitals:  Filed Vitals:   03/01/11 1415  BP: 185/111  Pulse: 108  Temp:   Resp: 18    Post vital signs: Reviewed and stable  Level of consciousness: awake, alert  and oriented  Complications: No apparent anesthesia complications

## 2011-03-01 NOTE — Anesthesia Procedure Notes (Addendum)
Anesthesia Regional Block:  Interscalene brachial plexus block  Pre-Anesthetic Checklist: ,, timeout performed, Correct Patient, Correct Site, Correct Laterality, Correct Procedure, Correct Position, site marked, Risks and benefits discussed, at surgeon's request and post-op pain management  Laterality: Upper and Right  Prep: chloraprep and alcohol swabs       Needles:  Injection technique: Single-shot  Needle Type: Stimulator Needle - 40      Needle Gauge: 22 and 22 G  Needle insertion depth: 2 cm   Additional Needles:  Procedures: nerve stimulator Interscalene brachial plexus block  Nerve Stimulator or Paresthesia:  Response: Twitch elicited, 0.5 mA, 0.3 ms,   Additional Responses:   Narrative:  Start time: 03/01/2011 11:51 AM End time: 03/01/2011 12:01 PM Injection made incrementally with aspirations every 5 mL.  Performed by: Personally  Anesthesiologist: Alma Friendly, MD  Additional Notes: Block assessed prior to start of surgery. Tolerated well. VSS  Interscalene brachial plexus block Procedure Name: Intubation Date/Time: 03/01/2011 12:51 PM Performed by: Gladys Damme Pre-anesthesia Checklist: Patient identified, Timeout performed, Emergency Drugs available, Suction available and Patient being monitored Patient Re-evaluated:Patient Re-evaluated prior to inductionOxygen Delivery Method: Circle System Utilized Preoxygenation: Pre-oxygenation with 100% oxygen Intubation Type: IV induction Ventilation: Mask ventilation without difficulty Laryngoscope Size: Miller and 2 Grade View: Grade I Tube type: Oral Number of attempts: 2 Placement Confirmation: ETT inserted through vocal cords under direct vision,  breath sounds checked- equal and bilateral and positive ETCO2 Secured at: 23 cm Tube secured with: Tape Dental Injury: Teeth and Oropharynx as per pre-operative assessment  Difficulty Due To: Difficulty was anticipated Future Recommendations: Recommend-  induction with short-acting agent, and alternative techniques readily available Comments: Pt chart states difficult intubation. Pt states unknown to him. Glidescope in room on induction. Able to see vocal cords, but small opening and short TMD.

## 2011-03-01 NOTE — Anesthesia Preprocedure Evaluation (Signed)
Anesthesia Evaluation  Patient identified by MRN, date of birth, ID band  Reviewed: Allergy & Precautions, H&P , NPO status , Patient's Chart, lab work & pertinent test results  History of Anesthesia Complications (+) DIFFICULT AIRWAY  Airway Mallampati: II TM Distance: <3 FB Neck ROM: Full    Dental  (+) Teeth Intact, Caps and Dental Advisory Given   Pulmonary neg pulmonary ROS,  clear to auscultation        Cardiovascular + CAD Regular Normal    Neuro/Psych    GI/Hepatic negative GI ROS, Neg liver ROS,   Endo/Other  Negative Endocrine ROS  Renal/GU negative Renal ROS     Musculoskeletal  (+) Arthritis -,   Abdominal   Peds  Hematology negative hematology ROS (+)   Anesthesia Other Findings   Reproductive/Obstetrics                           Anesthesia Physical Anesthesia Plan  ASA: III  Anesthesia Plan: General   Post-op Pain Management:    Induction: Intravenous  Airway Management Planned: Oral ETT  Additional Equipment:   Intra-op Plan:   Post-operative Plan: Extubation in OR  Informed Consent: I have reviewed the patients History and Physical, chart, labs and discussed the procedure including the risks, benefits and alternatives for the proposed anesthesia with the patient or authorized representative who has indicated his/her understanding and acceptance.   Dental advisory given  Plan Discussed with: CRNA and Surgeon  Anesthesia Plan Comments:         Anesthesia Quick Evaluation

## 2011-03-01 NOTE — Interval H&P Note (Signed)
History and Physical Interval Note:  03/01/2011 7:34 AM  George Vega  has presented today for surgery, with the diagnosis of impingement, degenerative arthritis,rc rupture  The various methods of treatment have been discussed with the patient and family. After consideration of risks, benefits and other options for treatment, the patient has consented to  Procedure(s): ROTATOR CUFF REPAIR SHOULDER, ARTHROSCOPY SHOULDER as a surgical intervention .  The patients' history has been reviewed, patient examined, no change in status, stable for surgery.  I have reviewed the patients' chart and labs.  Questions were answered to the patient's satisfaction.     [name] F

## 2011-03-01 NOTE — Progress Notes (Signed)
Assisted Dr. Massagee with right, interscalene  block. Side rails up, monitors on throughout procedure. See vital signs in flow sheet. Tolerated Procedure well. 

## 2011-03-01 NOTE — Brief Op Note (Signed)
03/01/2011  1:42 PM  PATIENT:  Valentina Shaggy  70 y.o. male  PRE-OPERATIVE DIAGNOSIS:  Right shoulder impingement, degenerative arthritis,rotator cuff  rupture  POST-OPERATIVE DIAGNOSIS:  Right shoulder impingement, degenerative arthritis  PROCEDURE:  Procedure(s): Right ARTHROSCOPY SHOULDER, chondroplasty, sad, dce, debridement  SURGEON:  Surgeon(s): Loreta Ave, MD  PHYSICIAN ASSISTANT: Zonia Kief M   ANESTHESIA:   general  EBL:  Total I/O In: 1000 [I.V.:1000] Out: -     SPECIMEN:  No Specimen  DISPOSITION OF SPECIMEN:  N/A  COUNTS:  YES  TOURNIQUET:  * No tourniquets in log *    PATIENT DISPOSITION:  PACU - hemodynamically stable.

## 2011-03-02 ENCOUNTER — Encounter (HOSPITAL_BASED_OUTPATIENT_CLINIC_OR_DEPARTMENT_OTHER): Payer: Self-pay | Admitting: Orthopedic Surgery

## 2011-03-03 NOTE — Op Note (Signed)
NAME:  George Vega, George Vega                   ACCOUNT NO.:  MEDICAL RECORD NO.:  [id number]  LOCATION:                                 FACILITY:  PHYSICIAN:  Loreta Ave, M.D.      DATE OF BIRTH:  DATE OF PROCEDURE:  03/01/2011 DATE OF DISCHARGE:                              OPERATIVE REPORT   PREOPERATIVE DIAGNOSES:  Right shoulder glenohumeral degenerative arthritis, chondral loose bodies.  Subacromial impingement, reactive bursitis.  Marked degenerative joint disease,  acromioclavicular joint.  POSTOPERATIVE DIAGNOSES:  Right shoulder glenohumeral degenerative arthritis, chondral loose bodies. Subacromial impingement, reactive bursitis. Marked degenerative joint disease,  acromioclavicular joint with grade 4 changes over more than a third of the joint with a lot of chondral debris as well as circumferential degenerative tearing of the labrum.  Partial tearing of the cuff, but nothing full-thickness.  PROCEDURE:  Right shoulder exam under anesthesia, arthroscopy. Chondroplasty glenohumeral joint with debridement of the humeral head, glenoid and labrum.  Bursectomy, acromioplasty, coracoacromial ligament release.  Excision distal clavicle.  SURGEON:  Loreta Ave, M.D.  ASSISTANT:  Genene Churn. Denton Meek., present throughout the entire case necessary for timely completion of procedure.  ANESTHESIA:  General.  BLOOD LOSS:  Minimal.  SPECIMENS:  None.  CULTURES:  None.  COMPLICATION:  None.  DRESSINGS:  Soft compressive with sling.  PROCEDURE IN DETAIL:  The patient was brought to the operating room, placed on the operating table in supine position.  After adequate anesthesia had been obtained, shoulder examined.  Fairly good motion and stability.  Placed in beach-chair position on the shoulder positioner, prepped and draped in the usual sterile fashion.  Three portals; anterior, posterior, lateral.  Arthroscope introduced, shoulder exsanguinated and inspected.   Extensive grade 4 changes, exposed bone, chondral flaps, loose body.  Grade 4 over more than third of the joint mostly on the humerus.  Chondroplasty to a stable surface.  Marked circumferential tearing of the labrum debrided.  Biceps tendon, biceps anchor, capsule ligamentous structures, rotator cuff intact.  A little fraying on the undersurface, no significant tears.  Cannula redirected subacromially.  Type 3 acromion.  Marked reactive bursitis with abrasive changes on the top of the cuff.  No full-thickness tears.  Bursa resected, cuff debrided.  Acromioplasty to a type 1 acromion with shaver and bur.  CA ligament released with cautery.  Distal clavicle grade 4 changes.  Periarticular spurs and a lateral centimeter of clavicle resected.  Adequacy of decompression, clavicle excision confirmed viewing from all portals.  Instruments were fully removed.  Portals closed with nylon.  Sterile compressive dressing applied.  Sling applied.  Anesthesia reversed.  Brought to the recovery room.  Tolerated surgery well.  No complications.     Loreta Ave, M.D.     DFM/MEDQ  D:  03/02/2011  T:  03/03/2011  Job:  [id number]

## 2011-04-30 ENCOUNTER — Encounter: Payer: Self-pay | Admitting: Gastroenterology

## 2011-05-03 DIAGNOSIS — M1990 Unspecified osteoarthritis, unspecified site: Secondary | ICD-10-CM | POA: Insufficient documentation

## 2011-05-03 DIAGNOSIS — K589 Irritable bowel syndrome without diarrhea: Secondary | ICD-10-CM | POA: Insufficient documentation

## 2011-05-24 ENCOUNTER — Telehealth: Payer: Self-pay | Admitting: Cardiovascular Disease

## 2011-05-24 NOTE — Telephone Encounter (Signed)
All Cardiac,Hospital faxed to [facility]  @ [phone] 05/24/11/KM

## 2011-06-07 ENCOUNTER — Encounter (HOSPITAL_COMMUNITY): Payer: Self-pay | Admitting: Pharmacy Technician

## 2011-06-07 ENCOUNTER — Encounter: Payer: Medicare Other | Admitting: Cardiovascular Disease

## 2011-06-13 NOTE — H&P (Addendum)
  Please see paper chart. No change in HPI 

## 2011-06-14 ENCOUNTER — Encounter (HOSPITAL_COMMUNITY)
Admission: RE | Disposition: A | Discharge status: Home / Self Care | Payer: Self-pay | Source: Ambulatory Visit | Attending: Cardiology

## 2011-06-14 ENCOUNTER — Ambulatory Visit (HOSPITAL_COMMUNITY)
Admission: RE | Admit: 2011-06-14 | Discharge: 2011-06-14 | Disposition: A | Discharge status: Home / Self Care | Payer: Medicare Other | Source: Ambulatory Visit | Attending: Cardiology | Admitting: Cardiology

## 2011-06-14 DIAGNOSIS — I2510 Atherosclerotic heart disease of native coronary artery without angina pectoris: Secondary | ICD-10-CM | POA: Insufficient documentation

## 2011-06-14 DIAGNOSIS — I2582 Chronic total occlusion of coronary artery: Secondary | ICD-10-CM | POA: Insufficient documentation

## 2011-06-14 DIAGNOSIS — I10 Essential (primary) hypertension: Secondary | ICD-10-CM | POA: Insufficient documentation

## 2011-06-14 HISTORY — PX: LEFT HEART CATHETERIZATION WITH CORONARY ANGIOGRAM: SHX5451

## 2011-06-14 SURGERY — LEFT HEART CATHETERIZATION WITH CORONARY ANGIOGRAM
Anesthesia: LOCAL | Laterality: Right

## 2011-06-14 MED ORDER — SODIUM CHLORIDE 0.9 % IJ SOLN: 3.0000 mL | Freq: Two times a day (BID) | INTRAMUSCULAR | Status: DC

## 2011-06-14 MED ORDER — MIDAZOLAM HCL 2 MG/2ML IJ SOLN
INTRAMUSCULAR | Status: AC
  Filled 2011-06-14: qty 2

## 2011-06-14 MED ORDER — ONDANSETRON HCL 4 MG/2ML IJ SOLN: 4.0000 mg | Freq: Four times a day (QID) | INTRAMUSCULAR | Status: DC | PRN

## 2011-06-14 MED ORDER — SODIUM CHLORIDE 0.9 % IV SOLN: 250.0000 mL | INTRAVENOUS | Status: DC | PRN

## 2011-06-14 MED ORDER — NITROGLYCERIN 0.2 MG/ML ON CALL CATH LAB
INTRAVENOUS | Status: AC
  Filled 2011-06-14: qty 1

## 2011-06-14 MED ORDER — SODIUM CHLORIDE 0.9 % IV SOLN
INTRAVENOUS | Status: DC
  Administered 2011-06-14: 07:00:00 via INTRAVENOUS

## 2011-06-14 MED ORDER — HEPARIN (PORCINE) IN NACL 2-0.9 UNIT/ML-% IJ SOLN
INTRAMUSCULAR | Status: AC
  Filled 2011-06-14: qty 2000

## 2011-06-14 MED ORDER — HEPARIN SODIUM (PORCINE) 1000 UNIT/ML IJ SOLN
INTRAMUSCULAR | Status: AC
  Filled 2011-06-14: qty 1

## 2011-06-14 MED ORDER — LIDOCAINE HCL (PF) 1 % IJ SOLN
INTRAMUSCULAR | Status: AC
  Filled 2011-06-14: qty 30

## 2011-06-14 MED ORDER — ASPIRIN 81 MG PO CHEW
324.0000 mg | CHEWABLE_TABLET | ORAL | Status: AC
  Administered 2011-06-14: 324 mg via ORAL
  Filled 2011-06-14: qty 4

## 2011-06-14 MED ORDER — SODIUM CHLORIDE 0.9 % IV SOLN: 1.0000 mL/kg/h | INTRAVENOUS | Status: DC

## 2011-06-14 MED ORDER — HYDROMORPHONE HCL PF 2 MG/ML IJ SOLN
INTRAMUSCULAR | Status: AC
  Filled 2011-06-14: qty 1

## 2011-06-14 MED ORDER — SODIUM CHLORIDE 0.9 % IJ SOLN: 3.0000 mL | INTRAMUSCULAR | Status: DC | PRN

## 2011-06-14 MED ORDER — ACETAMINOPHEN 325 MG PO TABS: 650.0000 mg | ORAL_TABLET | ORAL | Status: DC | PRN

## 2011-06-14 NOTE — Discharge Instructions (Signed)
Radial Site Care Refer to this sheet in the next few weeks. These instructions provide you with information on caring for yourself after your procedure. Your caregiver may also give you more specific instructions. Your treatment has been planned according to current medical practices, but problems sometimes occur. Call your caregiver if you have any problems or questions after your procedure. HOME CARE INSTRUCTIONS  You may shower the day after the procedure.Remove the bandage (dressing) and gently wash the site with plain soap and water.Gently pat the site dry.   Do not apply powder or lotion to the site.   Do not submerge the affected site in water for 3 to 5 days.   Inspect the site at least twice daily.   Do not flex or bend the affected arm for 24 hours.   No lifting over 5 pounds (2.3 kg) for 5 days after your procedure.   Do not drive home if you are discharged the same day of the procedure. Have someone else drive you.   You may drive 24 hours after the procedure unless otherwise instructed by your caregiver.   Do not operate machinery or power tools for 24 hours.   A responsible adult should be with you for the first 24 hours after you arrive home.  What to expect:  Any bruising will usually fade within 1 to 2 weeks.   Blood that collects in the tissue (hematoma) may be painful to the touch. It should usually decrease in size and tenderness within 1 to 2 weeks.  SEEK IMMEDIATE MEDICAL CARE IF:  You have unusual pain at the radial site.   You have redness, warmth, swelling, or pain at the radial site.   You have drainage (other than a small amount of blood on the dressing).   You have chills.   You have a fever or persistent symptoms for more than 72 hours.   You have a fever and your symptoms suddenly get worse.   Your arm becomes pale, cool, tingly, or numb.   You have heavy bleeding from the site. Hold pressure on the site.  Document Released: 03/31/2010  Document Revised: 02/15/2011 Document Reviewed: 03/31/2010 Exit[employer] Patient Information 2012 [employer]. 

## 2011-06-14 NOTE — CV Procedure (Signed)
[facility]     CARDIAC [facility]HETERIZATION REPORT  NAME: George Vega   MRN: [id number] DOB: 11-Feb-1941   ADMIT DATE:  06/14/2011  Performing Cardiologist: Pamella Pert  Primary Physician: No primary provider on file. Primary Cardiologist:  Jacinto Halim  Procedures Performed:  Left Heart Catheterization via 6 Right radial access  Left Ventriculography, (RAO/LAO) 10 ml/sec for 10 ml total contrast  Native Coronary Angiography  Indication(s): CAD. Positive Stress EKG at low work load  History: 71 y.o. male   Consent: The procedure with Risks/Benefits/Alternatives and Indications was reviewed with the patient  and family.  All questions were answered.    Risks / Complications include, but not limited to: Death, MI, CVA/TIA, VF/VT (with defibrillation), Bradycardia (need for temporary pacer placement), contrast induced nephropathy, bleeding / bruising / hematoma / pseudoaneurysm, vascular or coronary injury (with possible emergent CT or Vascular Surgery), adverse medication reactions, infection. Consent for signed by MD and patient with RN witness -- placed on chart.  Procedure: The patient was brought to the 2nd Floor [facility] in the fasting state and prepped and draped in the usual sterile fashion for (Right radial) access.  Sterile technique was used. A 6 [place] TIG for catheter was advanced into the ascending aorta and selective left and right coronary arteriography was performed the catheter was then exchanged out to a 5 [place] PIG tail catheter over a versacore wire and for ductography was performed the RAO projection catheter was then poorer body over the same wire. The patient was transported to the holding area in stable condition. No immediate complications noted Medications:    Sedation:  3 mg IV Versed, 0.50 mg Dilaudid  Contrast:  110 Omnipaque    Hemodynamics:  Central Aortic Pressure / Mean Aortic Pressure:  93/59/74  LV Pressure / LV End diastolic Pressure:  95/0/9  Left Ventriculography:  EF:  60%  Wall Motion: Normal  Coronary Angiographic Data:  Left Main:  Distal 20%   Left Anterior Descending (LAD):  Chronic total occlusion at mid LAD. Type II collaterals for RCA. No change from 2010.  1st diagonal (D1):  Large and comes off before CTO   2nd diagonal (D2):  small  3rd diagonal (D3):  NA  Circumflex (LCx):  Ostial 50% no change from 2010.  1st obtuse marginal:  Normal                                                          Ramus Intermedius:  Large. Mid 20%. No change from 2010    Right Coronary Artery: Dominant. Gives collaterals to occluded mid LAD. 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 Circumflex coronary artery has a ostial 50% stenoses. Coronary anatomy is unchanged from 2010.   Plan:  Patient can have left shoulder replacement surgery with acceptable cardiovascular risk. I will discuss further the angiographic data with my other interventional colleagues and see if bringing him back on an elective fashion for revascularization of CTO he is to be contemplated.   The case and results was discussed with the patient's  family.  Pamella Pert, M.D. 06/14/2011 8:13 AM

## 2011-06-15 MED FILL — Nicardipine HCl IV Soln 2.5 MG/ML: INTRAVENOUS | Qty: 1 | Status: AC

## 2011-06-15 MED FILL — Nicardipine HCl IV Soln 2.5 MG/ML: INTRAVENOUS | Qty: 10 | Status: AC

## 2011-06-22 ENCOUNTER — Encounter (HOSPITAL_BASED_OUTPATIENT_CLINIC_OR_DEPARTMENT_OTHER)
Admission: RE | Admit: 2011-06-22 | Discharge: 2011-06-22 | Disposition: A | Discharge status: Home / Self Care | Payer: Medicare Other | Source: Ambulatory Visit | Attending: Orthopedic Surgery | Admitting: Orthopedic Surgery

## 2011-06-22 ENCOUNTER — Encounter (HOSPITAL_BASED_OUTPATIENT_CLINIC_OR_DEPARTMENT_OTHER): Payer: Self-pay | Admitting: *Deleted

## 2011-06-22 LAB — BASIC METABOLIC PANEL
Chloride: 99 mEq/L (ref 96–112)
GFR calc Af Amer: >90 mL/min (ref >90)
Potassium: 4.5 mEq/L (ref 3.5–5.1)
Sodium: 136 mEq/L (ref 135–145)

## 2011-06-22 NOTE — Progress Notes (Signed)
Pt had shoulder scope-rcr here 12/12-did well-has had several surg here-had total lt shoulder at hosp several yr ago- Having rt toal shoulder here-dr gangi did cardiac cath last week to clear him for surg-this was good. Dr Gelene Mink did want to know if dr Nadara Eaton needed him to have telemetry post op. Call in to dr Nadara Eaton. Pt here for labs

## 2011-06-22 NOTE — Progress Notes (Signed)
Dr Nadara Eaton called back-he does not need telemetry post op-may have surgery here.

## 2011-06-26 NOTE — H&P (Signed)
[facility] [address]   [address] [place], [place] [address] [phone] A Division of [facility]  Loreta Ave, M.D.     Robert A. Thurston Hole, M.D.     Lunette Stands, M.D. Eulas Post, M.D.    Buford Dresser, M.D. Estell Harpin, M.D. Ralene Cork, D.O.          Genene Churn. Barry Dienes, PA-C            Kirstin A. Shepperson, PA-C Sandy, OPA-C   RE: George Vega, George Vega                                [id number]      DOB: 1940-09-18 PROGRESS NOTE: 05-22-11 Linford returns for follow up.  He has reached a point where he wants to proceed with total shoulder replacement on the right.  Well documented Grade IV changes throughout his shoulder.  Most recent treatment with debriding arthroscopy by me in December of last year.  Documented Grade IV changes throughout.  He has already had a left total shoulder replacement.  Well aware of what is involved with the procedure and anticipated outcome.  This is something we have already discussed at length about when we proceed with shoulder replacement on the right.  He has progressed to rest pain, night pain and marked functional impact.  He is maintaining relatively good motion, strength and function.  The only other new entity he has had has been some fullness and swelling in the supraclavicular region that I think is just coming from abnormal use of his shoulder.  No distal neurovascular symptoms.  Coming in to discuss definitive timing and treatment of his right shoulder.  Previous history, workup and treatment to date reviewed with him.  I have looked at his x-rays, as well as recent findings at the time of his arthroscopy back in December.  He remains very pleased with the outcome of his left shoulder.         EXAMINATION: On his exam today there is definitely a little supraclavicular fullness on the right compared to the left.  This is not warm.  Non-tender.  Nothing to suggest  infectious etiology.  Certainly no masses palpable in that area.  I can get him through just about full motion of the right shoulder, but he is painful in all planes.  On the left he has absolutely full motion and stable shoulder after shoulder replacement there.      DISPOSITION:  Thorough review of workup and treatment to date.  We are going to go ahead and proceed with scheduling right total shoulder.  Paperwork complete.  All questions answered.  At this point in time I don't think further workup is indicated or necessary for the fullness in his supraclavicular area and I think this is more reactive than anything else.  If he gets symptoms there or if it gets worse he will let me know and I will get a scan, but I really don't think that is indicated now.  We will see him either prior to or at the time of intervention with right total shoulder replacement.  He understands and agrees.    Loreta Ave, M.D.   Electronically verified by Loreta Ave, M.D. DFM:jjh D 05-23-11

## 2011-06-28 ENCOUNTER — Encounter (HOSPITAL_BASED_OUTPATIENT_CLINIC_OR_DEPARTMENT_OTHER): Payer: Self-pay | Admitting: Anesthesiology

## 2011-06-28 ENCOUNTER — Ambulatory Visit (HOSPITAL_BASED_OUTPATIENT_CLINIC_OR_DEPARTMENT_OTHER): Payer: Medicare Other | Admitting: Anesthesiology

## 2011-06-28 ENCOUNTER — Encounter (HOSPITAL_BASED_OUTPATIENT_CLINIC_OR_DEPARTMENT_OTHER)
Admission: RE | Disposition: A | Discharge status: Home / Self Care | Payer: Self-pay | Source: Ambulatory Visit | Attending: Orthopedic Surgery

## 2011-06-28 ENCOUNTER — Ambulatory Visit (HOSPITAL_BASED_OUTPATIENT_CLINIC_OR_DEPARTMENT_OTHER)
Admission: RE | Admit: 2011-06-28 | Discharge: 2011-06-29 | Disposition: A | Discharge status: Home / Self Care | Payer: Medicare Other | Source: Ambulatory Visit | Attending: Orthopedic Surgery | Admitting: Orthopedic Surgery

## 2011-06-28 ENCOUNTER — Encounter (HOSPITAL_BASED_OUTPATIENT_CLINIC_OR_DEPARTMENT_OTHER): Payer: Self-pay | Admitting: *Deleted

## 2011-06-28 DIAGNOSIS — I10 Essential (primary) hypertension: Secondary | ICD-10-CM | POA: Insufficient documentation

## 2011-06-28 DIAGNOSIS — I2510 Atherosclerotic heart disease of native coronary artery without angina pectoris: Secondary | ICD-10-CM | POA: Insufficient documentation

## 2011-06-28 DIAGNOSIS — M19019 Primary osteoarthritis, unspecified shoulder: Secondary | ICD-10-CM | POA: Insufficient documentation

## 2011-06-28 DIAGNOSIS — Z471 Aftercare following joint replacement surgery: Secondary | ICD-10-CM

## 2011-06-28 DIAGNOSIS — Z01812 Encounter for preprocedural laboratory examination: Secondary | ICD-10-CM | POA: Insufficient documentation

## 2011-06-28 HISTORY — DX: Depression, unspecified: F32.A

## 2011-06-28 HISTORY — DX: Essential (primary) hypertension: I10

## 2011-06-28 HISTORY — DX: Major depressive disorder, single episode, unspecified: F32.9

## 2011-06-28 HISTORY — PX: TOTAL SHOULDER ARTHROPLASTY: SHX126

## 2011-06-28 HISTORY — DX: Atherosclerotic heart disease of native coronary artery without angina pectoris: I25.10

## 2011-06-28 LAB — POCT HEMOGLOBIN-HEMACUE: Hemoglobin: 16.1 g/dL (ref 13.0–17.0)

## 2011-06-28 SURGERY — ARTHROPLASTY, SHOULDER, TOTAL
Anesthesia: General | Site: Shoulder | Laterality: Right | Wound class: Clean

## 2011-06-28 MED ORDER — HYDROMORPHONE HCL PF 1 MG/ML IJ SOLN
0.5000 mg | INTRAMUSCULAR | Status: DC | PRN
  Administered 2011-06-28 – 2011-06-29 (×3): 1 mg via INTRAVENOUS

## 2011-06-28 MED ORDER — PHENYLEPHRINE HCL 10 MG/ML IJ SOLN
10.0000 mg | INTRAVENOUS | Status: DC | PRN
  Administered 2011-06-28: 40 ug via INTRAVENOUS

## 2011-06-28 MED ORDER — SUCCINYLCHOLINE CHLORIDE 20 MG/ML IJ SOLN
INTRAMUSCULAR | Status: DC | PRN
  Administered 2011-06-28: 100 mg via INTRAVENOUS

## 2011-06-28 MED ORDER — METHOCARBAMOL 500 MG PO TABS
500.0000 mg | ORAL_TABLET | Freq: Four times a day (QID) | ORAL | Status: DC | PRN
  Administered 2011-06-28 – 2011-06-29 (×2): 500 mg via ORAL

## 2011-06-28 MED ORDER — FENTANYL CITRATE 0.05 MG/ML IJ SOLN
100.0000 ug | INTRAMUSCULAR | Status: DC | PRN
  Administered 2011-06-28: 100 ug via INTRAVENOUS

## 2011-06-28 MED ORDER — CEFAZOLIN SODIUM-DEXTROSE 2-3 GM-% IV SOLR
2.0000 g | INTRAVENOUS | Status: AC
  Administered 2011-06-28: 2 g via INTRAVENOUS

## 2011-06-28 MED ORDER — CEFAZOLIN SODIUM 1-5 GM-% IV SOLN
1.0000 g | Freq: Three times a day (TID) | INTRAVENOUS | Status: AC
  Administered 2011-06-28 – 2011-06-29 (×2): 1 g via INTRAVENOUS

## 2011-06-28 MED ORDER — OXYCODONE-ACETAMINOPHEN 5-325 MG PO TABS
1.0000 | ORAL_TABLET | ORAL | Status: DC | PRN
  Administered 2011-06-28 (×2): 2 via ORAL
  Administered 2011-06-29: 1 via ORAL
  Administered 2011-06-29: 2 via ORAL

## 2011-06-28 MED ORDER — PROPOFOL 10 MG/ML IV EMUL
INTRAVENOUS | Status: DC | PRN
  Administered 2011-06-28: 150 mg via INTRAVENOUS

## 2011-06-28 MED ORDER — ACETAMINOPHEN 10 MG/ML IV SOLN
1000.0000 mg | Freq: Once | INTRAVENOUS | Status: AC
  Administered 2011-06-28: 1000 mg via INTRAVENOUS

## 2011-06-28 MED ORDER — BUPIVACAINE-EPINEPHRINE PF 0.5-1:200000 % IJ SOLN
INTRAMUSCULAR | Status: DC | PRN
  Administered 2011-06-28: 30 mL

## 2011-06-28 MED ORDER — MIDAZOLAM HCL 2 MG/2ML IJ SOLN
2.0000 mg | INTRAMUSCULAR | Status: DC | PRN
  Administered 2011-06-28: 2 mg via INTRAVENOUS

## 2011-06-28 MED ORDER — EPHEDRINE SULFATE 50 MG/ML IJ SOLN
INTRAMUSCULAR | Status: DC | PRN
  Administered 2011-06-28: 15 mg via INTRAVENOUS
  Administered 2011-06-28: 10 mg via INTRAVENOUS

## 2011-06-28 MED ORDER — METOCLOPRAMIDE HCL 5 MG/ML IJ SOLN: 5.0000 mg | Freq: Three times a day (TID) | INTRAMUSCULAR | Status: DC | PRN

## 2011-06-28 MED ORDER — DOCUSATE SODIUM 100 MG PO CAPS
100.0000 mg | ORAL_CAPSULE | Freq: Two times a day (BID) | ORAL | Status: DC
  Administered 2011-06-28: 100 mg via ORAL

## 2011-06-28 MED ORDER — HYDROMORPHONE HCL PF 1 MG/ML IJ SOLN
0.2500 mg | INTRAMUSCULAR | Status: DC | PRN
Start: 2011-06-28 — End: 2011-06-29
  Administered 2011-06-28: 0.25 mg via INTRAVENOUS

## 2011-06-28 MED ORDER — METHOCARBAMOL 100 MG/ML IJ SOLN: 500.0000 mg | Freq: Four times a day (QID) | INTRAVENOUS | Status: DC | PRN

## 2011-06-28 MED ORDER — METOCLOPRAMIDE HCL 5 MG PO TABS: 5.0000 mg | ORAL_TABLET | Freq: Three times a day (TID) | ORAL | Status: DC | PRN

## 2011-06-28 MED ORDER — LIDOCAINE HCL (CARDIAC) 20 MG/ML IV SOLN
INTRAVENOUS | Status: DC | PRN
  Administered 2011-06-28: 50 mg via INTRAVENOUS

## 2011-06-28 MED ORDER — LACTATED RINGERS IV SOLN
INTRAVENOUS | Status: DC
  Administered 2011-06-28: 08:00:00 via INTRAVENOUS

## 2011-06-28 MED ORDER — ONDANSETRON HCL 4 MG/2ML IJ SOLN: 4.0000 mg | Freq: Four times a day (QID) | INTRAMUSCULAR | Status: DC | PRN

## 2011-06-28 MED ORDER — ONDANSETRON HCL 4 MG PO TABS: 4.0000 mg | ORAL_TABLET | Freq: Four times a day (QID) | ORAL | Status: DC | PRN

## 2011-06-28 MED ORDER — DEXAMETHASONE SODIUM PHOSPHATE 4 MG/ML IJ SOLN
INTRAMUSCULAR | Status: DC | PRN
  Administered 2011-06-28: 10 mg via INTRAVENOUS

## 2011-06-28 SURGICAL SUPPLY — 59 items
APL SKNCLS STERI-STRIP NONHPOA (GAUZE/BANDAGES/DRESSINGS) ×1
BANDAGE GAUZE ELAST BULKY 4 IN (GAUZE/BANDAGES/DRESSINGS) ×2 IMPLANT
BENZOIN TINCTURE PRP APPL 2/3 (GAUZE/BANDAGES/DRESSINGS) ×2 IMPLANT
BLADE SAW SGTL 83.5X18.5 (BLADE) ×3 IMPLANT
BLADE SURG 15 STRL LF DISP TIS (BLADE) ×1 IMPLANT
BLADE SURG 15 STRL SS (BLADE) ×4
BOWL SMART MIX CTS (DISPOSABLE) ×2 IMPLANT
CANISTER OMNI JUG 16 LITER (MISCELLANEOUS) ×1 IMPLANT
CANISTER SUCTION 2500CC (MISCELLANEOUS) IMPLANT
CEMENT BONE SIMPLEX SPEEDSET (Cement) ×2 IMPLANT
CLOTH BEACON ORANGE TIMEOUT ST (SAFETY) ×2 IMPLANT
DECANTER SPIKE VIAL GLASS SM (MISCELLANEOUS) IMPLANT
DRAPE U-SHAPE 47X51 STRL (DRAPES) ×2 IMPLANT
DRAPE U-SHAPE 76X120 STRL (DRAPES) ×4 IMPLANT
DRAPE UTILITY W/TAPE 26X15 (DRAPES) ×2 IMPLANT
DURAPREP 26ML APPLICATOR (WOUND CARE) ×2 IMPLANT
ELECT REM PT RETURN 9FT ADLT (ELECTROSURGICAL) ×2
ELECTRODE REM PT RTRN 9FT ADLT (ELECTROSURGICAL) ×1 IMPLANT
GAUZE XEROFORM 1X8 LF (GAUZE/BANDAGES/DRESSINGS) IMPLANT
GLENOID COMP (Orthopedic Implant) ×1 IMPLANT
GLOVE BIO SURGEON STRL SZ 6.5 (GLOVE) ×2 IMPLANT
GLOVE BIOGEL PI IND STRL 7.0 (GLOVE) ×1 IMPLANT
GLOVE BIOGEL PI IND STRL 8 (GLOVE) ×1 IMPLANT
GLOVE BIOGEL PI INDICATOR 7.0 (GLOVE) ×1
GLOVE BIOGEL PI INDICATOR 8 (GLOVE) ×1
GLOVE ORTHO TXT STRL SZ7.5 (GLOVE) ×4 IMPLANT
GOWN PREVENTION PLUS XLARGE (GOWN DISPOSABLE) ×4 IMPLANT
GOWN STRL REIN 2XL XLG LVL4 (GOWN DISPOSABLE) ×1 IMPLANT
HUMERAL HEAD OSTEL (Orthopedic Implant) ×1 IMPLANT
NDL 1/2 CIR CATGUT .05X1.09 (NEEDLE) IMPLANT
NEEDLE 1/2 CIR CATGUT .05X1.09 (NEEDLE) IMPLANT
NS IRRIG 1000ML POUR BTL (IV SOLUTION) ×2 IMPLANT
PACK ARTHROSCOPY DSU (CUSTOM PROCEDURE TRAY) ×2 IMPLANT
PACK BASIN DAY SURGERY FS (CUSTOM PROCEDURE TRAY) ×2 IMPLANT
PENCIL BUTTON HOLSTER BLD 10FT (ELECTRODE) ×2 IMPLANT
SLEEVE SCD COMPRESS KNEE MED (MISCELLANEOUS) ×2 IMPLANT
SLING ARM IMMOBILIZER LRG (SOFTGOODS) ×1 IMPLANT
SLING ARM IMMOBILIZER MED (SOFTGOODS) IMPLANT
SPONGE GAUZE 4X4 12PLY (GAUZE/BANDAGES/DRESSINGS) ×2 IMPLANT
SPONGE LAP 18X18 X RAY DECT (DISPOSABLE) ×5 IMPLANT
STAPLER VISISTAT (STAPLE) IMPLANT
STEM HUMERAL SHLDR 16MMX140MM (Orthopedic Implant) ×1 IMPLANT
STRIP CLOSURE SKIN 1/2X4 (GAUZE/BANDAGES/DRESSINGS) ×2 IMPLANT
SUCTION FRAZIER TIP 10 FR DISP (SUCTIONS) ×2 IMPLANT
SUT ETHIBOND 2 OS 4 DA (SUTURE) IMPLANT
SUT FIBERWIRE #2 38 T-5 BLUE (SUTURE) ×6
SUT VIC AB 2-0 SH 27 (SUTURE) ×4
SUT VIC AB 2-0 SH 27XBRD (SUTURE) ×2 IMPLANT
SUT VIC AB 3-0 SH 27 (SUTURE) ×2
SUT VIC AB 3-0 SH 27X BRD (SUTURE) IMPLANT
SUTURE FIBERWR #2 38 T-5 BLUE (SUTURE) ×5 IMPLANT
SYR 50ML LL SCALE MARK (SYRINGE) IMPLANT
SYR BULB 3OZ (MISCELLANEOUS) ×1 IMPLANT
SYR BULB IRRIGATION 50ML (SYRINGE) ×2 IMPLANT
TAPE PAPER 3X10 WHT MICROPORE (GAUZE/BANDAGES/DRESSINGS) ×2 IMPLANT
TOWEL OR 17X24 6PK STRL BLUE (TOWEL DISPOSABLE) ×4 IMPLANT
TUBE CONNECTING 20X1/4 (TUBING) ×3 IMPLANT
WATER STERILE IRR 1000ML POUR (IV SOLUTION) ×2 IMPLANT
YANKAUER SUCT BULB TIP NO VENT (SUCTIONS) ×2 IMPLANT

## 2011-06-28 NOTE — Progress Notes (Signed)
Assisted Dr. Fitzgerald with right, ultrasound guided, interscalene  block. Side rails up, monitors on throughout procedure. See vital signs in flow sheet. Tolerated Procedure well. 

## 2011-06-28 NOTE — Transfer of Care (Signed)
Immediate Anesthesia Transfer of Care Note  Patient: George Vega  Procedure(s) Performed: Procedure(s) (LRB): TOTAL SHOULDER ARTHROPLASTY (Right)  Patient Location: PACU  Anesthesia Type: General  Level of Consciousness: awake  Airway & Oxygen Therapy: Patient Spontanous Breathing and Patient connected to face mask oxygen  Post-op Assessment: Report given to PACU RN and Post -op Vital signs reviewed and stable  Post vital signs: Reviewed and stable  Complications: No apparent anesthesia complications

## 2011-06-28 NOTE — Anesthesia Postprocedure Evaluation (Signed)
  Anesthesia Post-op Note  Patient: George Vega  Procedure(s) Performed: Procedure(s) (LRB): TOTAL SHOULDER ARTHROPLASTY (Right)  Patient Location: PACU  Anesthesia Type: GA combined with regional for post-op pain  Level of Consciousness: awake  Airway and Oxygen Therapy: Patient Spontanous Breathing  Post-op Pain: none  Post-op Assessment: Post-op Vital signs reviewed, Patient's Cardiovascular Status Stable, Respiratory Function Stable, Patent Airway and No signs of Nausea or vomiting  Post-op Vital Signs: Reviewed and stable  Complications: No apparent anesthesia complications

## 2011-06-28 NOTE — Anesthesia Procedure Notes (Addendum)
Anesthesia Regional Block:  Interscalene brachial plexus block  Pre-Anesthetic Checklist: ,, timeout performed, Correct Patient, Correct Site, Correct Laterality, Correct Procedure, Correct Position, site marked, Risks and benefits discussed, pre-op evaluation,  At surgeon's request and post-op pain management  Laterality: Right  Prep: Maximum Sterile Barrier Precautions used and chloraprep       Needles:  Injection technique: Single-shot  Needle Type: Echogenic Stimulator Needle      Needle Gauge: 22 and 22 G    Additional Needles:  Procedures: ultrasound guided and nerve stimulator Interscalene brachial plexus block  Nerve Stimulator or Paresthesia:  Response: Biceps response, 0.4 mA,   Additional Responses:   Narrative:  Start time: 06/28/2011 8:09 AM End time: 06/28/2011 8:21 AM Injection made incrementally with aspirations every 5 mL. Anesthesiologist: Sampson Goon, MD  Additional Notes: 2% Lidocaine skin wheel.   Interscalene brachial plexus block Procedure Name: Intubation Performed by: York Grice Pre-anesthesia Checklist: Patient identified, Timeout performed, Emergency Drugs available, Suction available and Patient being monitored Patient Re-evaluated:Patient Re-evaluated prior to inductionOxygen Delivery Method: Circle system utilized Preoxygenation: Pre-oxygenation with 100% oxygen Intubation Type: IV induction Ventilation: Mask ventilation without difficulty Grade View: Grade I Tube type: Oral Tube size: 7.0 mm Number of attempts: 1 Airway Equipment and Method: Video-laryngoscopy Placement Confirmation: ETT inserted through vocal cords under direct vision,  breath sounds checked- equal and bilateral and positive ETCO2 Secured at: 22 cm Tube secured with: Tape Dental Injury: Teeth and Oropharynx as per pre-operative assessment

## 2011-06-28 NOTE — Anesthesia Preprocedure Evaluation (Addendum)
Anesthesia Evaluation  Patient identified by MRN, date of birth, ID band Patient awake    Reviewed: Allergy & Precautions, H&P , NPO status , Patient's Chart, lab work & pertinent test results, reviewed documented beta blocker date and time   History of Anesthesia Complications (+) DIFFICULT AIRWAY  Airway Mallampati: II TM Distance: >3 FB Neck ROM: Full    Dental No notable dental hx. (+) Teeth Intact   Pulmonary neg pulmonary ROS,  breath sounds clear to auscultation  Pulmonary exam normal       Cardiovascular hypertension, On Medications and On Home Beta Blockers + CAD Rhythm:Regular Rate:Normal     Neuro/Psych negative neurological ROS  negative psych ROS   GI/Hepatic negative GI ROS, Neg liver ROS,   Endo/Other  negative endocrine ROS  Renal/GU negative Renal ROS  negative genitourinary   Musculoskeletal   Abdominal   Peds  Hematology negative hematology ROS (+)   Anesthesia Other Findings   Reproductive/Obstetrics negative OB ROS                          Anesthesia Physical Anesthesia Plan  ASA: III  Anesthesia Plan: General   Post-op Pain Management:    Induction: Intravenous  Airway Management Planned: Oral ETT and Video Laryngoscope Planned  Additional Equipment:   Intra-op Plan:   Post-operative Plan: Extubation in OR  Informed Consent: I have reviewed the patients History and Physical, chart, labs and discussed the procedure including the risks, benefits and alternatives for the proposed anesthesia with the patient or authorized representative who has indicated his/her understanding and acceptance.     Plan Discussed with: CRNA  Anesthesia Plan Comments:         Anesthesia Quick Evaluation

## 2011-06-28 NOTE — Interval H&P Note (Signed)
History and Physical Interval Note:  06/28/2011 7:38 AM  George Vega  has presented today for surgery, with the diagnosis of right shoulder degenerative arthritis  The various methods of treatment have been discussed with the patient and family. After consideration of risks, benefits and other options for treatment, the patient has consented to  Procedure(s) (LRB): TOTAL SHOULDER ARTHROPLASTY (Right) as a surgical intervention .  The patients' history has been reviewed, patient examined, no change in status, stable for surgery.  I have reviewed the patients' chart and labs.  Questions were answered to the patient's satisfaction.     [name]

## 2011-06-28 NOTE — Brief Op Note (Signed)
06/28/2011  12:36 PM  PATIENT:  George Vega  71 y.o. male  PRE-OPERATIVE DIAGNOSIS:  right shoulder degenerative arthritis  POST-OPERATIVE DIAGNOSIS:  right shoulder degenerative arthritis  PROCEDURE:  Procedure(s) (LRB): TOTAL SHOULDER ARTHROPLASTY (Right)  SURGEON:  Surgeon(s) and Role:    * Loreta Ave, MD - Primary  PHYSICIAN ASSISTANT: Zonia Kief M   ANESTHESIA:   regional and general  EBL:  Total I/O In: 1700 [I.V.:1700] Out: -   SPECIMEN:  No Specimen  DISPOSITION OF SPECIMEN:  N/A  COUNTS:  YES  TOURNIQUET:  * No tourniquets in log *   PATIENT DISPOSITION:  PACU - hemodynamically stable.

## 2011-06-29 NOTE — Op Note (Signed)
NAME:  George Vega, George Vega NO.:  MEDICAL RECORD NO.:  [id number]  LOCATION:                                 FACILITY:  PHYSICIAN:  Loreta Ave, M.D. DATE OF BIRTH:  12-03-1940  DATE OF PROCEDURE:  06/28/2011 DATE OF DISCHARGE:                              OPERATIVE REPORT   PREOPERATIVE DIAGNOSIS:  End-stage degenerative arthritis, right shoulder.  POSTOPERATIVE DIAGNOSIS:  End-stage degenerative arthritis, right shoulder.  PROCEDURE:  Right total shoulder replacement utilizing a Stryker prosthesis.  Cemented pegged #7 glenoid component.  A press-fit 16 mm humeral component with a 45 mm +18 mm humeral head.  SURGEON:  Loreta Ave, M.D.  ASSISTANT:  Genene Churn. Barry Dienes, [place], present throughout the entire case and necessary for timely completion of the procedure.  ANESTHESIA:  General.  BLOOD LOSS:  Less than 100 mL.  BLOOD GIVEN:  None.  SPECIMENS:  None.  CULTURES:  None.  COMPLICATION:  None.  DRESSINGS:  Soft compressive with shoulder immobilizer.  PROCEDURE:  The patient was brought to the operating room, placed on the operating table in supine position.  After adequate general anesthesia had been obtained, placed in beach-chair position on the shoulder positioner, prepped and draped in usual sterile fashion.  Incision along the deltopectoral interval from the coracoid approximately to distal. Skin and subcutaneous tissues divided.  Deltopectoral interval developed, open retractor put in place.  Conjoined tendon also placed under the retractor to expose front of the shoulder.  Rotator cuff intact.  Previous adequate decompression.  The subscap was taken down and tagged with FiberWire, retracted medially.  Shoulder exposed.  Grade 4 change throughout.  Humeral head was cut appropriate angle for the prosthesis at 30 degrees of retroversion.  Glenoid exposed.  Grade 4 change throughout.  I debrided throughout.  The entire shoulder  cleared out removing all spurs and all debris.  I then brought the glenoid up to good bleeding bone.  Size, drilled, and fitted for #7 component. Proximal humerus exposed.  Handheld reamers and broaches bring it up to good fitting and sizing for a 16-mm stem.  All trials removed.  Copious irrigation.  Cement prepared and the #7 pegged compartment was firmly cemented in the glenoid.  All debris cleared out.  Once that was hardened, attention turned back to the humerus.  The #16 mm stem was seated with appropriate 30 degrees of retroversion.  After appropriate trials, a 45 mm x 18 mm head was attached.  With this construct, I had nice congruent sizing and matching of the humerus.  Full motion, excellent stability.  Wound irrigated.  Subscap, which had been taken down, was repaired with FiberWire throughout.  Retractors removed. Wound closed with subcutaneous subcuticular Vicryl.  Sterile compressive dressing applied.  Anesthesia reversed.  Brought to the recovery room. Tolerated surgery well.  No complications.     Loreta Ave, M.D.     DFM/MEDQ  D:  06/28/2011  T:  06/28/2011  Job:  [id number]

## 2011-07-04 ENCOUNTER — Encounter (HOSPITAL_BASED_OUTPATIENT_CLINIC_OR_DEPARTMENT_OTHER): Payer: Self-pay | Admitting: Orthopedic Surgery

## 2011-12-12 ENCOUNTER — Ambulatory Visit
Admission: RE | Admit: 2011-12-12 | Discharge: 2011-12-12 | Disposition: A | Discharge status: Home / Self Care | Payer: Medicare Other | Source: Ambulatory Visit | Attending: Orthopedic Surgery | Admitting: Orthopedic Surgery

## 2011-12-12 ENCOUNTER — Other Ambulatory Visit: Payer: Self-pay | Admitting: Orthopedic Surgery

## 2011-12-12 DIAGNOSIS — R222 Localized swelling, mass and lump, trunk: Secondary | ICD-10-CM

## 2012-07-25 DIAGNOSIS — M1990 Unspecified osteoarthritis, unspecified site: Secondary | ICD-10-CM | POA: Insufficient documentation

## 2012-07-25 DIAGNOSIS — M659 Synovitis and tenosynovitis, unspecified: Secondary | ICD-10-CM | POA: Insufficient documentation

## 2012-09-02 DIAGNOSIS — M2550 Pain in unspecified joint: Secondary | ICD-10-CM | POA: Insufficient documentation

## 2012-11-03 ENCOUNTER — Other Ambulatory Visit (HOSPITAL_COMMUNITY): Payer: Self-pay | Admitting: Physician Assistant

## 2012-11-03 ENCOUNTER — Telehealth (HOSPITAL_COMMUNITY): Payer: Self-pay | Admitting: Interventional Radiology

## 2012-11-03 DIAGNOSIS — I771 Stricture of artery: Secondary | ICD-10-CM

## 2012-11-03 NOTE — Telephone Encounter (Signed)
Called pt to schedule consult - left VM for pt to call back and schedule appt JMichaux

## 2012-11-04 ENCOUNTER — Other Ambulatory Visit (HOSPITAL_COMMUNITY): Payer: Self-pay | Admitting: Physician Assistant

## 2012-11-04 ENCOUNTER — Telehealth (HOSPITAL_COMMUNITY): Payer: Self-pay | Admitting: Interventional Radiology

## 2012-11-04 ENCOUNTER — Ambulatory Visit (HOSPITAL_COMMUNITY): Admission: RE | Admit: 2012-11-04 | Payer: Medicare Other | Source: Ambulatory Visit

## 2012-11-04 DIAGNOSIS — I771 Stricture of artery: Secondary | ICD-10-CM

## 2012-11-04 NOTE — Telephone Encounter (Signed)
Called pt left VM for him to call back if he wants to reschedule his consult JMichaux

## 2012-11-06 DIAGNOSIS — R9089 Other abnormal findings on diagnostic imaging of central nervous system: Secondary | ICD-10-CM | POA: Insufficient documentation

## 2012-11-11 ENCOUNTER — Ambulatory Visit (HOSPITAL_COMMUNITY)
Admission: RE | Admit: 2012-11-11 | Discharge: 2012-11-11 | Disposition: A | Discharge status: Home / Self Care | Payer: Medicare Other | Source: Ambulatory Visit | Attending: Physician Assistant | Admitting: Physician Assistant

## 2012-11-11 DIAGNOSIS — I771 Stricture of artery: Secondary | ICD-10-CM

## 2012-11-13 ENCOUNTER — Other Ambulatory Visit (HOSPITAL_COMMUNITY): Payer: Self-pay | Admitting: Interventional Radiology

## 2012-11-13 ENCOUNTER — Other Ambulatory Visit: Payer: Self-pay | Admitting: Radiology

## 2012-11-14 ENCOUNTER — Encounter (HOSPITAL_COMMUNITY): Payer: Self-pay | Admitting: Pharmacy Technician

## 2012-11-18 ENCOUNTER — Ambulatory Visit (HOSPITAL_COMMUNITY)
Admission: RE | Admit: 2012-11-18 | Discharge: 2012-11-18 | Disposition: A | Discharge status: Home / Self Care | Payer: Medicare Other | Source: Ambulatory Visit | Attending: Interventional Radiology | Admitting: Interventional Radiology

## 2012-11-18 ENCOUNTER — Encounter (HOSPITAL_COMMUNITY): Payer: Self-pay

## 2012-11-18 ENCOUNTER — Other Ambulatory Visit (HOSPITAL_COMMUNITY): Payer: Self-pay | Admitting: Interventional Radiology

## 2012-11-18 DIAGNOSIS — Z7982 Long term (current) use of aspirin: Secondary | ICD-10-CM | POA: Insufficient documentation

## 2012-11-18 DIAGNOSIS — Z887 Allergy status to serum and vaccine status: Secondary | ICD-10-CM | POA: Insufficient documentation

## 2012-11-18 DIAGNOSIS — I2510 Atherosclerotic heart disease of native coronary artery without angina pectoris: Secondary | ICD-10-CM | POA: Insufficient documentation

## 2012-11-18 DIAGNOSIS — Z888 Allergy status to other drugs, medicaments and biological substances status: Secondary | ICD-10-CM | POA: Insufficient documentation

## 2012-11-18 DIAGNOSIS — J329 Chronic sinusitis, unspecified: Secondary | ICD-10-CM | POA: Insufficient documentation

## 2012-11-18 DIAGNOSIS — K589 Irritable bowel syndrome without diarrhea: Secondary | ICD-10-CM | POA: Insufficient documentation

## 2012-11-18 DIAGNOSIS — Z79899 Other long term (current) drug therapy: Secondary | ICD-10-CM | POA: Insufficient documentation

## 2012-11-18 DIAGNOSIS — Z87891 Personal history of nicotine dependence: Secondary | ICD-10-CM | POA: Insufficient documentation

## 2012-11-18 DIAGNOSIS — F3289 Other specified depressive episodes: Secondary | ICD-10-CM | POA: Insufficient documentation

## 2012-11-18 DIAGNOSIS — Z7902 Long term (current) use of antithrombotics/antiplatelets: Secondary | ICD-10-CM | POA: Insufficient documentation

## 2012-11-18 DIAGNOSIS — I6509 Occlusion and stenosis of unspecified vertebral artery: Secondary | ICD-10-CM | POA: Insufficient documentation

## 2012-11-18 DIAGNOSIS — I10 Essential (primary) hypertension: Secondary | ICD-10-CM | POA: Insufficient documentation

## 2012-11-18 DIAGNOSIS — F329 Major depressive disorder, single episode, unspecified: Secondary | ICD-10-CM | POA: Insufficient documentation

## 2012-11-18 DIAGNOSIS — M129 Arthropathy, unspecified: Secondary | ICD-10-CM | POA: Insufficient documentation

## 2012-11-18 LAB — CBC WITH DIFFERENTIAL/PLATELET
Eosinophils Absolute: 0.5 10*3/uL (ref 0.0–0.7)
Lymphocytes Relative: 32 % (ref 12–46)
Lymphs Abs: 2.3 10*3/uL (ref 0.7–4.0)
Neutro Abs: 3.8 10*3/uL (ref 1.7–7.7)
Neutrophils Relative %: 51 % (ref 43–77)
Platelets: 193 10*3/uL (ref 150–400)
RBC: 4.42 MIL/uL (ref 4.22–5.81)
WBC: 7.4 10*3/uL (ref 4.0–10.5)

## 2012-11-18 LAB — PROTIME-INR
INR: 1.07 (ref 0.00–1.49)
Prothrombin Time: 13.7 seconds (ref 11.6–15.2)

## 2012-11-18 LAB — BASIC METABOLIC PANEL
Calcium: 9.1 mg/dL (ref 8.4–10.5)
GFR calc Af Amer: >90 mL/min (ref >90)
GFR calc non Af Amer: 82 mL/min — ABNORMAL LOW (ref >90)
Potassium: 4.2 mEq/L (ref 3.5–5.1)
Sodium: 135 mEq/L (ref 135–145)

## 2012-11-18 LAB — APTT: aPTT: 32 seconds (ref 24–37)

## 2012-11-18 MED ORDER — SODIUM CHLORIDE 0.9 % IV SOLN: INTRAVENOUS | Status: DC

## 2012-11-18 MED ORDER — FENTANYL CITRATE 0.05 MG/ML IJ SOLN
INTRAMUSCULAR | Status: AC | PRN
  Administered 2012-11-18 (×3): 25 ug via INTRAVENOUS
  Administered 2012-11-18: 12.5 ug via INTRAVENOUS

## 2012-11-18 MED ORDER — MIDAZOLAM HCL 2 MG/2ML IJ SOLN
INTRAMUSCULAR | Status: AC
  Filled 2012-11-18: qty 4

## 2012-11-18 MED ORDER — HEPARIN SOD (PORK) LOCK FLUSH 100 UNIT/ML IV SOLN
INTRAVENOUS | Status: AC | PRN
  Administered 2012-11-18 (×2): 500 [IU] via INTRAVENOUS

## 2012-11-18 MED ORDER — IOHEXOL 300 MG/ML  SOLN
150.0000 mL | Freq: Once | INTRAMUSCULAR | Status: AC | PRN
  Administered 2012-11-18: 100 mL via INTRA_ARTERIAL

## 2012-11-18 MED ORDER — SODIUM CHLORIDE 0.9 % IV SOLN: INTRAVENOUS | Status: AC

## 2012-11-18 MED ORDER — FENTANYL CITRATE 0.05 MG/ML IJ SOLN
INTRAMUSCULAR | Status: AC
  Filled 2012-11-18: qty 4

## 2012-11-18 MED ORDER — MIDAZOLAM HCL 2 MG/2ML IJ SOLN
INTRAMUSCULAR | Status: AC | PRN
  Administered 2012-11-18: 0.5 mg via INTRAVENOUS
  Administered 2012-11-18: 1 mg via INTRAVENOUS
  Administered 2012-11-18 (×2): 0.5 mg via INTRAVENOUS

## 2012-11-18 MED ORDER — HYDRALAZINE HCL 20 MG/ML IJ SOLN
INTRAMUSCULAR | Status: AC | PRN
  Administered 2012-11-18: 5 mg via INTRAVENOUS

## 2012-11-18 MED ORDER — HYDRALAZINE HCL 20 MG/ML IJ SOLN
INTRAMUSCULAR | Status: AC
  Filled 2012-11-18: qty 1

## 2012-11-18 NOTE — Procedures (Signed)
S/P 4 vessel cerebral arteriogram . RT CFA approach. Findings. 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 2.Mild Rt VA origin stenosis

## 2012-11-18 NOTE — H&P (Signed)
Chief Complaint: "I'm here for an angiogram" Referring Physician:Ganji HPI: George Vega is an 72 y.o. male with findings of a left vertebral stenosis/occlusion after the pt suffered an event earlier this summer. He is on Plavix and ASA and was seen by Dr. Corliss Skains, see full PACS consult. He is now scheduled for a cerebral angiogram. He otherwise feels well, with no recent fevers, chills, illnesses. PMHx and meds reviewed.  Past Medical History:  Past Medical History  Diagnosis Date  . Arthritis   . Spastic colon   . Chronic sinusitis   . Coronary artery disease   . Hypertension   . Depression   . Difficult intubation     was told with shoulder done 2006-alittle narrow    Past Surgical History:  Past Surgical History  Procedure Laterality Date  . Uvulopalatopharyngoplasty    . 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 Nasal sinus surgery    . Shoulder arthroscopy  03/01/2011    Procedure: ARTHROSCOPY SHOULDER;  Surgeon: Loreta Ave, MD;  Location: [facility];  Service: Orthopedics;  Laterality: Right;  arthroscopy shoulder decompression subacromial partial acromioplasty with coracoacromial release, distal claviculectomy, debridement of labrium  . Foot neuroma surgery  2002  . Shoulder arthroscopy  03/01/2011    Procedure: ARTHROSCOPY SHOULDER;  Surgeon: Loreta Ave, MD;  Location: [facility];  Service: Orthopedics;  Laterality: Right;  arthroscopy shoulder decompression subacromial partial acromioplasty with coracoacromial release, distal claviculectomy, debridement of labrium  . Cardiac catheterization  10/2008    normal  . Cardiac catheterization  4/13    no chg  . Total shoulder arthroplasty  06/28/2011    Procedure: TOTAL SHOULDER ARTHROPLASTY;  Surgeon: Loreta Ave, MD;  Location: [facility];  Service: Orthopedics;  Laterality: Right;    Family History: No family history on file.  Social History:  reports  that he quit smoking about 14 years ago. He does not have any smokeless tobacco history on file. He reports that he does not use illicit drugs. His alcohol history is not on file.  Allergies:  Allergies  Allergen Reactions  . Tetanus Toxoid Anaphylaxis and Hives    REACTION: hives/throat swells shut  . Fish Oil     cramping  . Glucosamine     cramping    Medications:   Medication List    ASK your doctor about these medications       acetaminophen 325 MG tablet  Commonly known as:  TYLENOL  Take 650 mg by mouth every 6 (six) hours as needed for pain.     aspirin 81 MG tablet  Take 81 mg by mouth daily.     carvedilol 6.25 MG tablet  Commonly known as:  COREG  Take 6.25 mg by mouth 2 (two) times daily with a meal.     clopidogrel 75 MG tablet  Commonly known as:  PLAVIX  Take 75 mg by mouth daily.     fluticasone 50 MCG/ACT nasal spray  Commonly known as:  FLONASE  Place 2 sprays into the nose daily as needed for rhinitis or allergies.     lisinopril 10 MG tablet  Commonly known as:  PRINIVIL,ZESTRIL  Take 10 mg by mouth daily.     PARoxetine 20 MG tablet  Commonly known as:  PAXIL  Take 20 mg by mouth every evening.     polycarbophil 625 MG tablet  Commonly known as:  FIBERCON  Take 625  mg by mouth daily.     rosuvastatin 5 MG tablet  Commonly known as:  CRESTOR  Take 5 mg by mouth daily.        Please HPI for pertinent positives, otherwise complete 10 system ROS negative.  Physical Exam: BP 163/104  Pulse 72  Temp(Src) 97.2 F (36.2 C) (Oral)  Resp 18  Ht 5\' 9"  (1.753 m)  Wt 210 lb (95.255 kg)  BMI 31 kg/m2  SpO2 96% Body mass index is 31 kg/(m^2).   General Appearance:  Alert, cooperative, no distress, appears stated age  Head:  Normocephalic, without obvious abnormality, atraumatic  ENT: Unremarkable  Neck: Supple, symmetrical, trachea midline  Lungs:   Clear to auscultation bilaterally, no w/r/r.  Chest Wall:  No tenderness or deformity   Heart:  Regular rate and rhythm, S1, S2 normal, no murmur, rub or gallop.  Abdomen:   Soft, non-tender, non distended.  Extremities: Extremities normal, atraumatic, no cyanosis or edema  Pulses: 2+ and symmetric  Neurologic: Normal affect, no gross deficits.   Results for orders placed during the hospital encounter of 11/18/12 (from the past 48 hour(s))  CBC WITH DIFFERENTIAL     Status: Abnormal   Collection Time    11/18/12  7:41 AM      Result Value Range   WBC 7.4  4.0 - 10.5 K/uL   RBC 4.42  4.22 - 5.81 MIL/uL   Hemoglobin 14.5  13.0 - 17.0 g/dL   HCT 16.1  09.6 - 04.5 %   MCV 92.1  78.0 - 100.0 fL   MCH 32.8  26.0 - 34.0 pg   MCHC 35.6  30.0 - 36.0 g/dL   RDW 40.9  81.1 - 91.4 %   Platelets 193  150 - 400 K/uL   Neutrophils Relative % 51  43 - 77 %   Neutro Abs 3.8  1.7 - 7.7 K/uL   Lymphocytes Relative 32  12 - 46 %   Lymphs Abs 2.3  0.7 - 4.0 K/uL   Monocytes Relative 9  3 - 12 %   Monocytes Absolute 0.6  0.1 - 1.0 K/uL   Eosinophils Relative 7 (*) 0 - 5 %   Eosinophils Absolute 0.5  0.0 - 0.7 K/uL   Basophils Relative 2 (*) 0 - 1 %   Basophils Absolute 0.1  0.0 - 0.1 K/uL   No results found.  Assessment/Plan Vertebral stenosis/occlusion. For cerebral angiogram today. Discussed procedure, risks, complications, use of sedation. Labs pending. Consent signed in chart  Brayton El PA-C 11/18/2012, 8:01 AM

## 2012-11-18 NOTE — Progress Notes (Signed)
UP AND WALKED AND TOL WELL AND RIGHT GROIN STABLE; NO BLEEDING OR HEMATOMA 

## 2012-11-18 NOTE — ED Notes (Signed)
Family updated as to patient's status.

## 2012-11-18 NOTE — ED Notes (Signed)
C/o R shoulder pain, fentanyl per order

## 2013-04-02 ENCOUNTER — Encounter (HOSPITAL_COMMUNITY): Payer: Self-pay | Admitting: Radiology

## 2013-04-02 ENCOUNTER — Observation Stay (HOSPITAL_COMMUNITY)
Admission: EM | Admit: 2013-04-02 | Discharge: 2013-04-03 | Disposition: A | Discharge status: Home / Self Care | Payer: Medicare Other | Attending: Family Medicine | Admitting: Family Medicine

## 2013-04-02 ENCOUNTER — Emergency Department (HOSPITAL_COMMUNITY): Payer: Medicare Other

## 2013-04-02 DIAGNOSIS — IMO0002 Reserved for concepts with insufficient information to code with codable children: Secondary | ICD-10-CM | POA: Insufficient documentation

## 2013-04-02 DIAGNOSIS — M129 Arthropathy, unspecified: Secondary | ICD-10-CM | POA: Insufficient documentation

## 2013-04-02 DIAGNOSIS — R270 Ataxia, unspecified: Secondary | ICD-10-CM | POA: Diagnosis present

## 2013-04-02 DIAGNOSIS — F3289 Other specified depressive episodes: Secondary | ICD-10-CM

## 2013-04-02 DIAGNOSIS — R279 Unspecified lack of coordination: Principal | ICD-10-CM | POA: Insufficient documentation

## 2013-04-02 DIAGNOSIS — R21 Rash and other nonspecific skin eruption: Secondary | ICD-10-CM | POA: Insufficient documentation

## 2013-04-02 DIAGNOSIS — Z9889 Other specified postprocedural states: Secondary | ICD-10-CM | POA: Insufficient documentation

## 2013-04-02 DIAGNOSIS — R262 Difficulty in walking, not elsewhere classified: Secondary | ICD-10-CM | POA: Insufficient documentation

## 2013-04-02 DIAGNOSIS — F341 Dysthymic disorder: Secondary | ICD-10-CM

## 2013-04-02 DIAGNOSIS — Z79899 Other long term (current) drug therapy: Secondary | ICD-10-CM | POA: Insufficient documentation

## 2013-04-02 DIAGNOSIS — F329 Major depressive disorder, single episode, unspecified: Secondary | ICD-10-CM | POA: Insufficient documentation

## 2013-04-02 DIAGNOSIS — R9431 Abnormal electrocardiogram [ECG] [EKG]: Secondary | ICD-10-CM

## 2013-04-02 DIAGNOSIS — T887XXA Unspecified adverse effect of drug or medicament, initial encounter: Secondary | ICD-10-CM

## 2013-04-02 DIAGNOSIS — R9389 Abnormal findings on diagnostic imaging of other specified body structures: Secondary | ICD-10-CM

## 2013-04-02 DIAGNOSIS — Z87891 Personal history of nicotine dependence: Secondary | ICD-10-CM | POA: Insufficient documentation

## 2013-04-02 DIAGNOSIS — K589 Irritable bowel syndrome without diarrhea: Secondary | ICD-10-CM | POA: Insufficient documentation

## 2013-04-02 DIAGNOSIS — Z7982 Long term (current) use of aspirin: Secondary | ICD-10-CM | POA: Insufficient documentation

## 2013-04-02 DIAGNOSIS — R5383 Other fatigue: Secondary | ICD-10-CM

## 2013-04-02 DIAGNOSIS — I2510 Atherosclerotic heart disease of native coronary artery without angina pectoris: Secondary | ICD-10-CM

## 2013-04-02 DIAGNOSIS — R29810 Facial weakness: Secondary | ICD-10-CM

## 2013-04-02 DIAGNOSIS — R5381 Other malaise: Secondary | ICD-10-CM | POA: Insufficient documentation

## 2013-04-02 DIAGNOSIS — E785 Hyperlipidemia, unspecified: Secondary | ICD-10-CM

## 2013-04-02 DIAGNOSIS — Z7902 Long term (current) use of antithrombotics/antiplatelets: Secondary | ICD-10-CM | POA: Insufficient documentation

## 2013-04-02 DIAGNOSIS — I10 Essential (primary) hypertension: Secondary | ICD-10-CM

## 2013-04-02 DIAGNOSIS — T428X5A Adverse effect of antiparkinsonism drugs and other central muscle-tone depressants, initial encounter: Secondary | ICD-10-CM | POA: Insufficient documentation

## 2013-04-02 DIAGNOSIS — D72829 Elevated white blood cell count, unspecified: Secondary | ICD-10-CM | POA: Diagnosis present

## 2013-04-02 LAB — GLUCOSE, CAPILLARY: GLUCOSE-CAPILLARY: 104 mg/dL — AB (ref 70–99)

## 2013-04-02 LAB — URINALYSIS, ROUTINE W REFLEX MICROSCOPIC
Bilirubin Urine: NEGATIVE
Glucose, UA: NEGATIVE mg/dL
Ketones, ur: 15 mg/dL — AB
Leukocytes, UA: NEGATIVE
NITRITE: NEGATIVE
Protein, ur: NEGATIVE mg/dL
Specific Gravity, Urine: 1.019 (ref 1.005–1.030)
UROBILINOGEN UA: 1 mg/dL (ref 0.0–1.0)
pH: 6.5 (ref 5.0–8.0)

## 2013-04-02 LAB — COMPREHENSIVE METABOLIC PANEL
ALBUMIN: 3.6 g/dL (ref 3.5–5.2)
ALT: 33 U/L (ref 0–53)
AST: 38 U/L — ABNORMAL HIGH (ref 0–37)
Alkaline Phosphatase: 87 U/L (ref 39–117)
BUN: 12 mg/dL (ref 6–23)
CO2: 23 mEq/L (ref 19–32)
CREATININE: 0.9 mg/dL (ref 0.50–1.35)
Calcium: 8.9 mg/dL (ref 8.4–10.5)
Chloride: 97 mEq/L (ref 96–112)
GFR calc Af Amer: >90 mL/min (ref >90)
GFR calc non Af Amer: 83 mL/min — ABNORMAL LOW (ref >90)
Glucose, Bld: 113 mg/dL — ABNORMAL HIGH (ref 70–99)
POTASSIUM: 4.3 meq/L (ref 3.7–5.3)
Sodium: 135 mEq/L — ABNORMAL LOW (ref 137–147)
TOTAL PROTEIN: 6.8 g/dL (ref 6.0–8.3)
Total Bilirubin: 1.1 mg/dL (ref 0.3–1.2)

## 2013-04-02 LAB — TROPONIN I

## 2013-04-02 LAB — CBC
HCT: 42.2 % (ref 39.0–52.0)
HEMOGLOBIN: 14.7 g/dL (ref 13.0–17.0)
MCH: 33 pg (ref 26.0–34.0)
MCHC: 34.8 g/dL (ref 30.0–36.0)
MCV: 94.8 fL (ref 78.0–100.0)
Platelets: 195 10*3/uL (ref 150–400)
RBC: 4.45 MIL/uL (ref 4.22–5.81)
RDW: 12.7 % (ref 11.5–15.5)
WBC: 18.1 10*3/uL — ABNORMAL HIGH (ref 4.0–10.5)

## 2013-04-02 LAB — URINE MICROSCOPIC-ADD ON

## 2013-04-02 LAB — PROTIME-INR
INR: 1.03 (ref 0.00–1.49)
PROTHROMBIN TIME: 13.3 s (ref 11.6–15.2)

## 2013-04-02 LAB — DIFFERENTIAL
BASOS PCT: 0 % (ref 0–1)
Basophils Absolute: 0 10*3/uL (ref 0.0–0.1)
Eosinophils Absolute: 0.3 10*3/uL (ref 0.0–0.7)
Eosinophils Relative: 2 % (ref 0–5)
Lymphocytes Relative: 6 % — ABNORMAL LOW (ref 12–46)
Lymphs Abs: 1.1 10*3/uL (ref 0.7–4.0)
MONOS PCT: 3 % (ref 3–12)
Monocytes Absolute: 0.5 10*3/uL (ref 0.1–1.0)
NEUTROS PCT: 89 % — AB (ref 43–77)
Neutro Abs: 16.2 10*3/uL — ABNORMAL HIGH (ref 1.7–7.7)

## 2013-04-02 LAB — APTT: aPTT: 23 seconds — ABNORMAL LOW (ref 24–37)

## 2013-04-02 MED ORDER — CLOPIDOGREL BISULFATE 75 MG PO TABS
75.0000 mg | ORAL_TABLET | Freq: Once | ORAL | Status: AC
  Administered 2013-04-02: 75 mg via ORAL
  Filled 2013-04-02: qty 1

## 2013-04-02 MED ORDER — MORPHINE SULFATE 4 MG/ML IJ SOLN: 4.0000 mg | Freq: Once | INTRAMUSCULAR | Status: DC

## 2013-04-02 MED ORDER — OXYCODONE HCL 5 MG PO TABS
5.0000 mg | ORAL_TABLET | Freq: Once | ORAL | Status: AC
Start: 2013-04-02 — End: 2013-04-02
  Administered 2013-04-02: 5 mg via ORAL
  Filled 2013-04-02: qty 1

## 2013-04-02 NOTE — ED Notes (Signed)
Patient is back from CT at this time.

## 2013-04-02 NOTE — ED Notes (Signed)
Pt in MRI at this time 

## 2013-04-02 NOTE — ED Notes (Signed)
Patient blood sugar was 104 notified RN of blood sugar

## 2013-04-02 NOTE — Consult Note (Signed)
Neurology Consultation Reason for Consult: Left facial droop Referring Physician: Christy Gentles, D.  CC: Left facial droop  History is obtained from: Patient, EMS  HPI: George Vega is a 73 y.o. male with a history of previous stroke causing left-sided facial droop and went to bed in his normal state around 3 PM. He recently had surgery, and has been taking medications for pain and took Robaxin at 2:45 PM.   He subsequently was noted around 4 to have worse facial droop and normal as well as be confused. He had some difficulty walking and therefore his wife called 911 concerned that he might be having a stroke.   LKW: 3 PM tpa given?: no, mild deficits, recent surgery    ROS: A 14 point ROS was performed and is negative except as noted in the HPI.  Past Medical History  Diagnosis Date  . Arthritis   . Spastic colon   . Chronic sinusitis   . Coronary artery disease   . Hypertension   . Depression   . Difficult intubation     was told with shoulder done 2006-alittle narrow    Family History: No history of similar  Social History: Tob: Former smoker  Exam: Current vital signs: BP 144/80  Pulse 97  Temp(Src) 98.9 F (37.2 C) (Oral)  Resp 22  SpO2 94% Vital signs in last 24 hours: Temp:  [98.9 F (37.2 C)] 98.9 F (37.2 C) (01/22 1751) Pulse Rate:  [95-108] 97 (01/22 1843) Resp:  [19-23] 22 (01/22 1843) BP: (135-170)/(80-113) 144/80 mmHg (01/22 1843) SpO2:  [93 %-97 %] 94 % (01/22 1843)  General: In bed, NAD CV: Regular rate and rhythm Mental Status: Patient is awake, alert, oriented to person, place, month, year, and situation. Immediate and remote memory are intact. Patient is able to give a clear and coherent history. No signs of aphasia or neglect Mild latency of response with speech, but not clearly aphasic Cranial Nerves: II: Visual Fields are full. Pupils are equal, round, and reactive to light.  Discs are difficult to visualize. III,IV, VI: EOMI  without ptosis or diploplia.  V: Facial sensation is symmetric to temperature VII: Facial movement is very mildly diminished in the low VIII: hearing is intact to voice X: Uvula elevates symmetrically XI: Shoulder shrug is symmetric. XII: tongue is midline without atrophy or fasciculations.  Motor: Tone is normal. Bulk is normal. 5/5 strength was present in all four extremities.  Sensory: Sensation is symmetric to light touch and temperature in the arms and legs. Deep Tendon Reflexes: 2+ and symmetric in the biceps and patellae.  Plantars: Toes are downgoing bilaterally.  Cerebellar: FNF with mild difficulty on finger-nose-finger on the right. Intact on left Gait: Patient has a slightly wide-based unsteady gait  I have reviewed labs in epic and the results pertinent to this consultation are: Leukocytosis  I have reviewed the images obtained: CT head-negative acute  Impression: 73 year old male with vertebral artery stenosis who presents with some mild difficulty walking as well as possible worsening of an underlying facial droop. Possibilities include new stroke, unmasking of deficits in the setting of an physiological stressor (patient does have rash), medication effect from the roboxin.  Recommendations: 1) MRI brain 2) if positive, will need full stroke workup, if negative, I do not have enough based on history alone to consider this TIA. 3) I would favor restarting antiplatelets if okay from surgical prospective. 4) defer workup of leukocytosis and rash to ER  Roland Rack, MD [facility]  [phone]  If 7pm- 7am, please page neurology on call at [phone].

## 2013-04-02 NOTE — ED Notes (Signed)
Per EMS: pt wife said patient took a nap around 15:00 which is his LSN. Pt daughter and wife noticed at 16:15 that he was having a left facial droop, and pt was weak walking and not answer questions appropriately. Pt took his pain medication for surgery he had in his left arm around 11:45 and he took his robaxin around 14:45. Pt states that robaxin usually makes him tired. Pt hypertensive with EMS 179/107

## 2013-04-02 NOTE — H&P (Signed)
Patient Demographics  George Vega, is a 73 y.o. male  MRN: [id number]   DOB - 08-Sep-1940  Admit Date - 04/02/2013  Outpatient Primary MD for the patient is Pcp Not In System   With History of -  Past Medical History  Diagnosis Date  . Arthritis   . Spastic colon   . Chronic sinusitis   . Coronary artery disease   . Hypertension   . Depression   . Difficult intubation     was told with shoulder done 2006-alittle narrow      Past Surgical History  Procedure Laterality Date  . Uvulopalatopharyngoplasty    . 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 Nasal sinus surgery    . Shoulder arthroscopy  03/01/2011    Procedure: ARTHROSCOPY SHOULDER;  Surgeon: Ninetta Lights, MD;  Location: [place] [facility];  Service: Orthopedics;  Laterality: Right;  arthroscopy shoulder decompression subacromial partial acromioplasty with coracoacromial release, distal claviculectomy, debridement of labrium  . Foot neuroma surgery  2002  . Shoulder arthroscopy  03/01/2011    Procedure: ARTHROSCOPY SHOULDER;  Surgeon: Ninetta Lights, MD;  Location: [place];  Service: Orthopedics;  Laterality: Right;  arthroscopy shoulder decompression subacromial partial acromioplasty with coracoacromial release, distal claviculectomy, debridement of labrium  . Cardiac catheterization  10/2008    normal  . Cardiac catheterization  4/13    no chg  . Total shoulder arthroplasty  06/28/2011    Procedure: TOTAL SHOULDER ARTHROPLASTY;  Surgeon: Ninetta Lights, MD;  Location: [place];  Service: Orthopedics;  Laterality: Right;    in for   Chief Complaint  Patient presents with  . Code Stroke     HPI  George Vega  is a 73 y.o. male, with past medical history CVA with left-sided facial droop,  patient presents to ED today as he was found to have left-sided facial droop by his family when he woke up from a nap around 3 PM, wife at bedside and she gives a history, report she noticed some droop around his left eye, but denies any droop around the mouth on the cheek, as well denies any slurred speech, patient denies any dysphagia, patient was seen by in urology in ED, and had MRI done which did not show any acute findings, as well patient reports some ataxia, after he took his Robaxin refer having a nap, as well patient developed some rash in the lower extremities, he reports today he started to take vitamin B supplements and vitamin C and Robaxin, patient was found to have leukocytosis of 18,000, he denies any polyuria dysuria cough productive sputum fever or chills at home, patient reports he had recent surgery by [facility] orthopedics, 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   Review of Systems  In addition to the HPI above, No Fever-chills, No Headache, No changes with Vision or hearing, No problems swallowing food or Liquids, No Chest pain, Cough or Shortness of Breath, No Abdominal pain, No Nausea or Vommitting, Bowel movements are regular, No Blood in stool or Urine, No dysuria, No new skin rashes or bruises, No new joints pains-aches,  No new weakness, tingling, numbness in any extremity,has left facial droop. No recent weight gain or loss, No polyuria, polydypsia or polyphagia, No significant Mental Stressors.  A full 10 point Review of Systems was done, except as stated above, all other Review of Systems were negative.   Social History History  Substance Use Topics  . Smoking status: Former Smoker    Quit date: 02/27/1998  . Smokeless tobacco: Not on file  . Alcohol Use:      Family History History reviewed. No pertinent family  history.  Prior to Admission medications   Medication Sig Start Date End Date Taking? Authorizing Provider  aspirin 81 MG tablet Take 81 mg by mouth daily.     Yes Historical Provider, MD  carvedilol (COREG) 6.25 MG tablet Take 6.25 mg by mouth 2 (two) times daily with a meal.   Yes Historical Provider, MD  clopidogrel (PLAVIX) 75 MG tablet Take 75 mg by mouth at bedtime.    Yes Historical Provider, MD  fluticasone (FLONASE) 50 MCG/ACT nasal spray Place 2 sprays into the nose daily as needed for rhinitis or allergies.   Yes Historical Provider, MD  lisinopril (PRINIVIL,ZESTRIL) 10 MG tablet Take 10 mg by mouth daily.   Yes Historical Provider, MD  methocarbamol (ROBAXIN) 500 MG tablet Take 500 mg by mouth as needed. 03/31/13  Yes Historical Provider, MD  ondansetron (ZOFRAN-ODT) 8 MG disintegrating tablet Take 8 mg by mouth as needed. Nausea 04/01/13  Yes Historical Provider, MD  oxyCODONE (OXY IR/ROXICODONE) 5 MG immediate release tablet Take 5 mg by mouth once as needed. 03/31/13  Yes Historical Provider, MD  PARoxetine (PAXIL) 20 MG tablet Take 20 mg by mouth every evening.     Yes Historical Provider, MD  polycarbophil (FIBERCON) 625 MG tablet Take 625 mg by mouth daily.     Yes Historical Provider, MD  rosuvastatin (CRESTOR) 5 MG tablet Take 5 mg by mouth daily.   Yes Historical Provider, MD    Allergies  Allergen Reactions  . Tetanus Toxoid Anaphylaxis and Hives    REACTION: hives/throat swells shut  . Fish Oil     cramping  . Glucosamine     cramping    Physical Exam  Vitals  Blood pressure 141/78, pulse 92, temperature 98.6 F (37 C), temperature source Oral, resp. rate 12, SpO2 98.00%.   1. General well-nourished male is comfortable in bed ,lying in bed in NAD,   2. Normal affect and insight, Not Suicidal or Homicidal, Awake Alert, Oriented X 3.  3. No F.N deficits, ALL C.Nerves Intact, Strength 5/5 all 4 extremities, Sensation intact all 4 extremities, Plantars down  going.  4. Ears and Eyes appear Normal, Conjunctivae clear, PERRLA. Moist Oral Mucosa.  5. Supple Neck, No JVD, No cervical lymphadenopathy appriciated, No Carotid Bruits.  6. Symmetrical Chest wall movement, Good air movement bilaterally, CTAB.  7. RRR, No Gallops, Rubs or Murmurs, No Parasternal Heave.  8. Positive Bowel Sounds, Abdomen Soft, Non tender, No organomegaly appriciated,No rebound -guarding or rigidity.  9.  No Cyanosis, Normal Skin Turgor, No Skin Rash or Bruise. Has a cast and the left arm, move  his fingers without any deficits, has normal sensation, has no cyanosis, good capillary refill, no swelling  10. Good muscle tone,  joints appear normal , no effusions, Normal ROM.  11. No Palpable Lymph Nodes in Neck or Axillae    Data Review  CBC  Recent Labs Lab 04/02/13 1722  WBC 18.1*  HGB 14.7  HCT 42.2  PLT 195  MCV 94.8  MCH 33.0  MCHC 34.8  RDW 12.7  LYMPHSABS 1.1  MONOABS 0.5  EOSABS 0.3  BASOSABS 0.0   ------------------------------------------------------------------------------------------------------------------  Chemistries   Recent Labs Lab 04/02/13 1722  NA 135*  K 4.3  CL 97  CO2 23  GLUCOSE 113*  BUN 12  CREATININE 0.90  CALCIUM 8.9  AST 38*  ALT 33  ALKPHOS 87  BILITOT 1.1   ------------------------------------------------------------------------------------------------------------------ CrCl is unknown because both a height and weight (above a minimum accepted value) are required for this calculation. ------------------------------------------------------------------------------------------------------------------ No results found for this basename: TSH, T4TOTAL, FREET3, T3FREE, THYROIDAB,  in the last 72 hours   Coagulation profile  Recent Labs Lab 04/02/13 1722  INR 1.03   ------------------------------------------------------------------------------------------------------------------- No results found for this  basename: DDIMER,  in the last 72 hours -------------------------------------------------------------------------------------------------------------------  Cardiac Enzymes  Recent Labs Lab 04/02/13 1722  TROPONINI <0.30   ------------------------------------------------------------------------------------------------------------------ No components found with this basename: POCBNP,    ---------------------------------------------------------------------------------------------------------------  Urinalysis No results found for this basename: colorurine, appearanceur, labspec, phurine, glucoseu, hgbur, bilirubinur, ketonesur, proteinur, urobilinogen, nitrite, leukocytesur    ----------------------------------------------------------------------------------------------------------------  Imaging results:   Ct Head (brain) Wo Contrast  04/02/2013   CLINICAL DATA:  Left-sided facial droop.  EXAM: CT HEAD WITHOUT CONTRAST  TECHNIQUE: Contiguous axial images were obtained from the base of the skull through the vertex without intravenous contrast.  COMPARISON:  10/02/2004.  FINDINGS: No intracranial hemorrhage.  Remote infarct right caudate head.  Small vessel disease type changes.  Pontine infarct cannot be excluded. This appearance may be related to streak artifact.  Global atrophy. This has progressed since the prior exam. The ventricular prominence probably is related to atrophy rather than hydrocephalus.  No intracranial mass lesion noted on this unenhanced exam.  Vascular calcifications.  Mild mucosal thickening/ partial opacification ethmoid sinus air cells and right maxillary sinus.  IMPRESSION: No intracranial hemorrhage.  Remote infarct right caudate head.  Small vessel disease type changes.  Pontine infarct cannot be excluded. This appearance may be related to streak artifact.  Global atrophy  These results were called by telephone at the time of interpretation on 04/02/2013 at 5:51 PM  to Dr. Leonel Ramsay who verbally acknowledged these results.   Electronically Signed   By: Chauncey Cruel M.D.   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   Electronically Signed   By: Logan Bores   On: 04/02/2013 20:41   04/02/2013   CLINICAL DATA:  History of previous stroke with left-sided facial droop. Recently had hand surgery and was noted to have worsening of facial droop and difficulty walking.  EXAM: MRI HEAD WITHOUT CONTRAST  TECHNIQUE: Multiplanar, multiecho pulse sequences of the brain and surrounding structures were obtained without intravenous contrast.  COMPARISON:  Head CT 04/02/2013  FINDINGS: The patient experienced heating in his wrist during the examination and the examination was terminated at that point. Axial T1 and coronal T2 weighted images were not  obtained.  There is no evidence of acute infarct or intracranial hemorrhage. Periventricular T2 hyperintensities are nonspecific but compatible with mild chronic small vessel ischemic disease. There is moderate cerebral atrophy. 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 There is ex vacuo dilatation of the right frontal horn due to the right caudate infarct. There is no evidence of mass, midline shift, or extra-axial fluid collection. Orbits are unremarkable. Mild bilateral ethmoid air cell mucosal thickening is present. Mastoid air cells are clear. Visualized distal left vertebral artery flow void is abnormal.  IMPRESSION: 1. Incomplete examination as above. 2. No evidence of acute infarct or intracranial hemorrhage. 3. Mild chronic small vessel ischemic disease and moderate cerebral atrophy, including bilateral basal ganglia infarcts. 4. Abnormal distal left vertebral artery flow void may reflect high-grade stenosis or occlusion. Dense calcification is  present on CT.  Electronically Signed: By: Logan Bores On: 04/02/2013 20:21        Assessment & Plan  Active Problems:   Facial droop   Ataxia   Leukocytosis   CAD (coronary artery disease)   Hyperlipemia   HTN (hypertension)   Rash    1. left facial droop and ataxia, it was not significant upon presentation, and it involved mainly the high, no other symptoms, the patient in neurologic workup is negative, ataxia most likely related to Robaxin. 2. Leukocytosis. The patient is afebrile, etiology is unclear, will obtain blood cultures Will check x-ray and will send urine analysis, will defer on starting any antibiotic pending the workup, as well given the fact patient had recent hand surgery, and currently is covered with a cast, would have morning team consult in [facility] orthopedic to evaluate the patient in the a.m. to see if they can remote a cast and inspect the surgical site to rule out any source of infection.  3. Coronary artery disease. Patient denies chest pain or shortness of breath, continue with aspirin, Plavix, statin, beta blockers, 4. Hypertension. Blood pressure is acceptable, continue with home medication 5. Rash. Most likely related to the new medication he started including Robaxin, vitamin C, and vitamin B, will monitor DVT Prophylaxis Heparin AM Labs Ordered, also please review Full Orders  Family Communication: Admission, patients condition and plan of care including tests being ordered have been discussed with the patient and wife who indicate understanding and agree with the plan and Code Status.  Code Status full Likely DC to  home Condition GUARDED Time spent in minutes : 55 minutes    ELGERGAWY, DAWOOD M.D on 04/02/2013 at 10:13 PM  Between 7am to 7pm -  After 7pm go to [email or web address] - password [email or web address]  And look for the night coverage person covering me after hours  [facility] Office  [phone]

## 2013-04-02 NOTE — ED Provider Notes (Signed)
CSN: [id number]     Arrival date & time 04/02/13  1718 History   First MD Initiated Contact with Patient 04/02/13 1720     Chief Complaint  Patient presents with  . Code Stroke    Patient is a 73 y.o. male presenting with weakness. The history is provided by the patient.  Weakness This is a new problem. Episode onset: just prior to arrival. The problem occurs constantly. The problem has been gradually improving. Pertinent negatives include no chest pain, no abdominal pain and no shortness of breath. Nothing aggravates the symptoms. Nothing relieves the symptoms. He has tried rest for the symptoms.  pt presents for concern for acute onset of left facial droop and difficulty walking np cp/sob.  No HA   Last known well at 1500 Pt did take a robaxin tablet just prior to this episode He also reports rash to his abdomen as well   Past Medical History  Diagnosis Date  . Arthritis   . Spastic colon   . Chronic sinusitis   . Coronary artery disease   . Hypertension   . Depression   . Difficult intubation     was told with shoulder done 2006-alittle narrow   Past Surgical History  Procedure Laterality Date  . Uvulopalatopharyngoplasty    . 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 Nasal sinus surgery    . Shoulder arthroscopy  03/01/2011    Procedure: ARTHROSCOPY SHOULDER;  Surgeon: Ninetta Lights, MD;  Location: [place];  Service: Orthopedics;  Laterality: Right;  arthroscopy shoulder decompression subacromial partial acromioplasty with coracoacromial release, distal claviculectomy, debridement of labrium  . Foot neuroma surgery  2002  . Shoulder arthroscopy  03/01/2011    Procedure: ARTHROSCOPY SHOULDER;  Surgeon: Ninetta Lights, MD;  Location: [place];  Service: Orthopedics;  Laterality: Right;  arthroscopy shoulder decompression subacromial partial acromioplasty with coracoacromial release, distal claviculectomy, debridement of labrium  .  Cardiac catheterization  10/2008    normal  . Cardiac catheterization  4/13    no chg  . Total shoulder arthroplasty  06/28/2011    Procedure: TOTAL SHOULDER ARTHROPLASTY;  Surgeon: Ninetta Lights, MD;  Location: [place];  Service: Orthopedics;  Laterality: Right;   History reviewed. No pertinent family history. History  Substance Use Topics  . Smoking status: Former Smoker    Quit date: 02/27/1998  . Smokeless tobacco: Not on file  . Alcohol Use:     Review of Systems  Respiratory: Negative for shortness of breath.   Cardiovascular: Negative for chest pain.  Gastrointestinal: Negative for vomiting, abdominal pain and diarrhea.  Neurological: Positive for weakness.  All other systems reviewed and are negative.    Allergies  Tetanus toxoid; Fish oil; and Glucosamine  Home Medications   Current Outpatient Rx  Name  Route  Sig  Dispense  Refill  . acetaminophen (TYLENOL) 325 MG tablet   Oral   Take 650 mg by mouth every 6 (six) hours as needed for pain.         Marland Kitchen aspirin 81 MG tablet   Oral   Take 81 mg by mouth daily.           . carvedilol (COREG) 6.25 MG tablet   Oral   Take 6.25 mg by mouth 2 (two) times daily with a meal.         . clopidogrel (PLAVIX) 75 MG tablet   Oral   Take  75 mg by mouth daily.         . fluticasone (FLONASE) 50 MCG/ACT nasal spray   Nasal   Place 2 sprays into the nose daily as needed for rhinitis or allergies.         Marland Kitchen lisinopril (PRINIVIL,ZESTRIL) 10 MG tablet   Oral   Take 10 mg by mouth daily.         Marland Kitchen PARoxetine (PAXIL) 20 MG tablet   Oral   Take 20 mg by mouth every evening.           . polycarbophil (FIBERCON) 625 MG tablet   Oral   Take 625 mg by mouth daily.           . rosuvastatin (CRESTOR) 5 MG tablet   Oral   Take 5 mg by mouth daily.          BP 170/113  Pulse 108  Temp(Src) 98.9 F (37.2 C) (Oral)  Resp 19  SpO2 96% Physical Exam CONSTITUTIONAL: Well developed/well  nourished HEAD: Normocephalic/atraumatic EYES: EOMI/PERRL ENMT: Mucous membranes moist NECK: supple no meningeal signs CV: S1/S2 noted, no murmurs/rubs/gallops noted LUNGS: Lungs are clear to auscultation bilaterally, no apparent distress ABDOMEN: soft, nontender, no rebound or guarding GU:no cva tenderness NEURO: Pt is awake/alert, moves all extremitiesx4. ?left facial droop.  No significant arm/leg drift is noted EXTREMITIES: pulses normal, full ROM, left UE in splint SKIN: warm, color normal, erythematous rash to abdomen.   PSYCH: no abnormalities of mood noted  ED Course  Procedures (including critical care time)  6:24 PM Pt seen in conjunction with dr Leonel Ramsay with neuro Pt had left facial droop and difficulty walking onset at approximately 1500 It is also noted he had recent left UE surgery (has splint in place) and took robaxin and this may be cause of his symptoms His symptoms are very mild  Per dr Leonel Ramsay, pt is not a TPA candidate He has ordered MRI.  He feels if this is negative he can be discharged  10:11 PM Pt still ataxic with walking.  He has diffuse rash to body.  He has been on new meds (vitamin B6, vitamin C and robaxin) which could be contributing to his symptoms.  This is more likely medication reaction rather than acute CVA.  Will admit for observation as he is a fall risk  Labs Review Labs Reviewed  CBC - Abnormal; Notable for the following:    WBC 18.1 (*)    All other components within normal limits  DIFFERENTIAL - Abnormal; Notable for the following:    Neutrophils Relative % 89 (*)    Neutro Abs 16.2 (*)    Lymphocytes Relative 6 (*)    All other components within normal limits  PROTIME-INR  APTT  COMPREHENSIVE METABOLIC PANEL  TROPONIN I   Imaging Review Ct Head (brain) Wo Contrast  04/02/2013   CLINICAL DATA:  Left-sided facial droop.  EXAM: CT HEAD WITHOUT CONTRAST  TECHNIQUE: Contiguous axial images were obtained from the base of the  skull through the vertex without intravenous contrast.  COMPARISON:  10/02/2004.  FINDINGS: No intracranial hemorrhage.  Remote infarct right caudate head.  Small vessel disease type changes.  Pontine infarct cannot be excluded. This appearance may be related to streak artifact.  Global atrophy. This has progressed since the prior exam. The ventricular prominence probably is related to atrophy rather than hydrocephalus.  No intracranial mass lesion noted on this unenhanced exam.  Vascular calcifications.  Mild mucosal thickening/ partial  opacification ethmoid sinus air cells and right maxillary sinus.  IMPRESSION: No intracranial hemorrhage.  Remote infarct right caudate head.  Small vessel disease type changes.  Pontine infarct cannot be excluded. This appearance may be related to streak artifact.  Global atrophy  These results were called by telephone at the time of interpretation on 04/02/2013 at 5:51 PM to Dr. Leonel Ramsay who verbally acknowledged these results.   Electronically Signed   By: Chauncey Cruel M.D.   On: 04/02/2013 17:58    EKG Interpretation    Date/Time:  Thursday April 02 2013 17:56:08 EST Ventricular Rate:  92 PR Interval:  175 QRS Duration: 87 QT Interval:  348 QTC Calculation: 430 R Axis:   21 Text Interpretation:  Sinus rhythm Low voltage, precordial leads Baseline wander in lead(s) V1 V2 Confirmed by Christy Gentles  MD, Luisdaniel Kenton [phone]) on 04/02/2013 6:02:25 PM            MDM  No diagnosis found. Nursing notes including past medical history and social history reviewed and considered in documentation Labs/vital reviewed and considered     Sharyon Cable, MD 04/02/13 2212

## 2013-04-02 NOTE — ED Notes (Signed)
Pt and family updated on plan of care. Denies any request at this time.

## 2013-04-03 ENCOUNTER — Observation Stay (HOSPITAL_COMMUNITY): Payer: Medicare Other

## 2013-04-03 LAB — URINALYSIS, ROUTINE W REFLEX MICROSCOPIC
BILIRUBIN URINE: NEGATIVE
Glucose, UA: NEGATIVE mg/dL
HGB URINE DIPSTICK: NEGATIVE
KETONES UR: NEGATIVE mg/dL
Nitrite: NEGATIVE
PROTEIN: NEGATIVE mg/dL
Specific Gravity, Urine: 1.017 (ref 1.005–1.030)
UROBILINOGEN UA: 1 mg/dL (ref 0.0–1.0)
pH: 6 (ref 5.0–8.0)

## 2013-04-03 LAB — CBC
HCT: 39.5 % (ref 39.0–52.0)
Hemoglobin: 13.5 g/dL (ref 13.0–17.0)
MCH: 32.4 pg (ref 26.0–34.0)
MCHC: 34.2 g/dL (ref 30.0–36.0)
MCV: 94.7 fL (ref 78.0–100.0)
PLATELETS: 200 10*3/uL (ref 150–400)
RBC: 4.17 MIL/uL — ABNORMAL LOW (ref 4.22–5.81)
RDW: 12.6 % (ref 11.5–15.5)
WBC: 17.3 10*3/uL — ABNORMAL HIGH (ref 4.0–10.5)

## 2013-04-03 LAB — BASIC METABOLIC PANEL
BUN: 12 mg/dL (ref 6–23)
CALCIUM: 9.1 mg/dL (ref 8.4–10.5)
CO2: 25 mEq/L (ref 19–32)
CREATININE: 0.93 mg/dL (ref 0.50–1.35)
Chloride: 99 mEq/L (ref 96–112)
GFR calc Af Amer: >90 mL/min (ref >90)
GFR calc non Af Amer: 82 mL/min — ABNORMAL LOW (ref >90)
Glucose, Bld: 122 mg/dL — ABNORMAL HIGH (ref 70–99)
Potassium: 4.2 mEq/L (ref 3.7–5.3)
Sodium: 137 mEq/L (ref 137–147)

## 2013-04-03 LAB — URINE MICROSCOPIC-ADD ON

## 2013-04-03 MED ORDER — CALCIUM POLYCARBOPHIL 625 MG PO TABS
625.0000 mg | ORAL_TABLET | Freq: Every day | ORAL | Status: DC
  Administered 2013-04-03: 625 mg via ORAL
  Filled 2013-04-03: qty 1

## 2013-04-03 MED ORDER — LISINOPRIL 10 MG PO TABS
10.0000 mg | ORAL_TABLET | Freq: Every day | ORAL | Status: DC
  Administered 2013-04-03: 10 mg via ORAL
  Filled 2013-04-03: qty 1

## 2013-04-03 MED ORDER — DOCUSATE SODIUM 100 MG PO CAPS
100.0000 mg | ORAL_CAPSULE | Freq: Two times a day (BID) | ORAL | Status: DC
  Filled 2013-04-03 (×2): qty 1

## 2013-04-03 MED ORDER — SENNA 8.6 MG PO TABS
1.0000 | ORAL_TABLET | Freq: Two times a day (BID) | ORAL | Status: DC
  Filled 2013-04-03 (×2): qty 1

## 2013-04-03 MED ORDER — HEPARIN SODIUM (PORCINE) 5000 UNIT/ML IJ SOLN
5000.0000 [IU] | Freq: Three times a day (TID) | INTRAMUSCULAR | Status: DC
  Administered 2013-04-03: 5000 [IU] via SUBCUTANEOUS
  Filled 2013-04-03 (×4): qty 1

## 2013-04-03 MED ORDER — CEPHALEXIN 500 MG PO CAPS: 500.0000 mg | ORAL_CAPSULE | Freq: Four times a day (QID) | ORAL | Status: DC

## 2013-04-03 MED ORDER — OXYCODONE HCL 5 MG PO TABS
5.0000 mg | ORAL_TABLET | ORAL | Status: DC | PRN
  Administered 2013-04-03 (×2): 5 mg via ORAL
  Filled 2013-04-03 (×2): qty 1

## 2013-04-03 MED ORDER — CLOPIDOGREL BISULFATE 75 MG PO TABS
75.0000 mg | ORAL_TABLET | Freq: Every day | ORAL | Status: DC
  Administered 2013-04-03: 75 mg via ORAL
  Filled 2013-04-03: qty 1

## 2013-04-03 MED ORDER — CARVEDILOL 6.25 MG PO TABS
6.2500 mg | ORAL_TABLET | Freq: Two times a day (BID) | ORAL | Status: DC
  Administered 2013-04-03: 6.25 mg via ORAL
  Filled 2013-04-03 (×3): qty 1

## 2013-04-03 MED ORDER — FLUTICASONE PROPIONATE 50 MCG/ACT NA SUSP: 2.0000 | Freq: Every day | NASAL | Status: DC | PRN

## 2013-04-03 MED ORDER — BIOTENE DRY MOUTH MT LIQD
15.0000 mL | Freq: Two times a day (BID) | OROMUCOSAL | Status: DC
  Administered 2013-04-03: 15 mL via OROMUCOSAL

## 2013-04-03 MED ORDER — PANTOPRAZOLE SODIUM 40 MG IV SOLR
40.0000 mg | Freq: Once | INTRAVENOUS | Status: AC
  Administered 2013-04-03: 40 mg via INTRAVENOUS
  Filled 2013-04-03: qty 40

## 2013-04-03 MED ORDER — ASPIRIN EC 81 MG PO TBEC
81.0000 mg | DELAYED_RELEASE_TABLET | Freq: Every day | ORAL | Status: DC
  Administered 2013-04-03: 81 mg via ORAL
  Filled 2013-04-03: qty 1

## 2013-04-03 MED ORDER — ACETAMINOPHEN 325 MG PO TABS: 650.0000 mg | ORAL_TABLET | Freq: Four times a day (QID) | ORAL | Status: DC | PRN

## 2013-04-03 MED ORDER — PAROXETINE HCL 20 MG PO TABS
20.0000 mg | ORAL_TABLET | Freq: Every evening | ORAL | Status: DC
  Filled 2013-04-03: qty 1

## 2013-04-03 MED ORDER — ATORVASTATIN CALCIUM 10 MG PO TABS
10.0000 mg | ORAL_TABLET | Freq: Every day | ORAL | Status: DC
Start: 2013-04-03 — End: 2013-04-03
  Filled 2013-04-03: qty 1

## 2013-04-03 MED ORDER — SODIUM CHLORIDE 0.9 % IV SOLN
INTRAVENOUS | Status: DC
  Administered 2013-04-03: 01:00:00 via INTRAVENOUS

## 2013-04-03 MED ORDER — ACETAMINOPHEN 650 MG RE SUPP: 650.0000 mg | Freq: Four times a day (QID) | RECTAL | Status: DC | PRN

## 2013-04-03 MED ORDER — ALBUTEROL SULFATE (2.5 MG/3ML) 0.083% IN NEBU: 2.5000 mg | INHALATION_SOLUTION | RESPIRATORY_TRACT | Status: DC | PRN

## 2013-04-03 NOTE — Progress Notes (Signed)
Orthopedic Tech Progress Note Patient Details:  George Vega [birth year] [id number] Supplies delivered for use for Dr. Veronia Beets to apply custom casting. Pre-fab fiberglass and ace wraps.  Patient ID: George Vega, male   DOB: Apr 02, 1940, 73 y.o.   MRN: [id number]   Fenton Foy 04/03/2013, 12:04 PM

## 2013-04-03 NOTE — Progress Notes (Signed)
Pt admitted from the ED to 3w07, pt is alert x4, vital signs stable, pt has a cast on lt. Hand from a prior surgery, has a slight lt facial drop which pt states was from a previous stroke, admission hx and assessment has been completed on pt. Care plan has been initiated. Will continue to monitor pt.----Brylen Wagar, rn

## 2013-04-03 NOTE — Progress Notes (Signed)
[facility] PROGRESS NOTE  CAIDENCE HIGASHI KGM:[id number] DOB: Feb 17, 1941 DOA: 04/02/2013 PCP: Pcp Not In System  Assessment/Plan: 1. left facial droop and ataxia, - completely resolved now. it was not significant upon presentation, and it involved mainly the eye, no other symptoms, the patient in neurologic workup is negative, ataxia most likely related to Robaxin. Pt has restarted plavix daily.    2. Leukocytosis. The patient is afebrile, etiology is unclear, will obtain blood cultures.   Check chest x-ray and urine negative so far , will defer on starting any antibiotic pending the workup, as well given the fact patient had recent hand surgery, and currently is covered with a cast, will ask [facility] orthopedic to evaluate the wound to see if they can  inspect the surgical site to rule out any source of infection.   3. Coronary artery disease. Patient denies chest pain or shortness of breath, continue with aspirin, Plavix, statin, beta blockers,   4. Hypertension. Blood pressure is acceptable, continue with home medication   5. Rash. Most likely related to the new medication he started including Robaxin, vitamin C, and vitamin B, will monitor  DVT Prophylaxis Heparin   Family Communication: Admission, patients condition and plan of care including tests being ordered have been discussed with the patient and wife who indicate understanding and agree with the plan and Code Status.   Code Status full   Condition GUARDED  HPI/Subjective: Pt without complaints.  No fever or chills, No nausea, emesis, weakness or visual changes.   Objective: Filed Vitals:   04/03/13 0200  BP: 139/79  Pulse: 98  Temp:   Resp: 20    Intake/Output Summary (Last 24 hours) at 04/03/13 0803 Last data filed at 04/03/13 0600  Gross per 24 hour  Intake    480 ml  Output   1050 ml  Net   -570 ml   Filed Weights   04/03/13 0006  Weight: 214 lb 6.4 oz (97.251 kg)    Exam:   General:  Awake,  alert, no distress  Cardiovascular: normal s1, s2 sounds   Respiratory: BBS clear to auscultation   Abdomen: soft, nondistended, nontender  Neuro: nonfocal   Musculoskeletal: no CCE   Data Reviewed: Basic Metabolic Panel:  Recent Labs Lab 04/02/13 1722 04/03/13 0115  NA 135* 137  K 4.3 4.2  CL 97 99  CO2 23 25  GLUCOSE 113* 122*  BUN 12 12  CREATININE 0.90 0.93  CALCIUM 8.9 9.1   Liver Function Tests:  Recent Labs Lab 04/02/13 1722  AST 38*  ALT 33  ALKPHOS 87  BILITOT 1.1  PROT 6.8  ALBUMIN 3.6   No results found for this basename: LIPASE, AMYLASE,  in the last 168 hours No results found for this basename: AMMONIA,  in the last 168 hours CBC:  Recent Labs Lab 04/02/13 1722 04/03/13 0115  WBC 18.1* 17.3*  NEUTROABS 16.2*  --   HGB 14.7 13.5  HCT 42.2 39.5  MCV 94.8 94.7  PLT 195 200   Cardiac Enzymes:  Recent Labs Lab 04/02/13 1722  TROPONINI <0.30   BNP (last 3 results) No results found for this basename: PROBNP,  in the last 8760 hours CBG:  Recent Labs Lab 04/02/13 1839  GLUCAP 104*    No results found for this or any previous visit (from the past 240 hour(s)).   Studies: Ct Head (brain) Wo Contrast  04/02/2013   CLINICAL DATA:  Left-sided facial droop.  EXAM: CT HEAD WITHOUT  CONTRAST  TECHNIQUE: Contiguous axial images were obtained from the base of the skull through the vertex without intravenous contrast.  COMPARISON:  10/02/2004.  FINDINGS: No intracranial hemorrhage.  Remote infarct right caudate head.  Small vessel disease type changes.  Pontine infarct cannot be excluded. This appearance may be related to streak artifact.  Global atrophy. This has progressed since the prior exam. The ventricular prominence probably is related to atrophy rather than hydrocephalus.  No intracranial mass lesion noted on this unenhanced exam.  Vascular calcifications.  Mild mucosal thickening/ partial opacification ethmoid sinus air cells and right  maxillary sinus.  IMPRESSION: No intracranial hemorrhage.  Remote infarct right caudate head.  Small vessel disease type changes.  Pontine infarct cannot be excluded. This appearance may be related to streak artifact.  Global atrophy  These results were called by telephone at the time of interpretation on 04/02/2013 at 5:51 PM to Dr. Leonel Ramsay who verbally acknowledged these results.   Electronically Signed   By: Chauncey Cruel M.D.   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   Electronically Signed   By: Logan Bores   On: 04/02/2013 20:41   04/02/2013   CLINICAL DATA:  History of previous stroke with left-sided facial droop. Recently had hand surgery and was noted to have worsening of facial droop and difficulty walking.  EXAM: MRI HEAD WITHOUT CONTRAST  TECHNIQUE: Multiplanar, multiecho pulse sequences of the brain and surrounding structures were obtained without intravenous contrast.  COMPARISON:  Head CT 04/02/2013  FINDINGS: The patient experienced heating in his wrist during the examination and the examination was terminated at that point. Axial T1 and coronal T2 weighted images were not obtained.  There is no evidence of acute infarct or intracranial hemorrhage. Periventricular T2 hyperintensities are nonspecific but compatible with mild chronic small vessel ischemic disease. There is moderate cerebral atrophy. 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 There is ex vacuo dilatation of the right frontal horn due to the right caudate infarct. There is no evidence of mass, midline shift, or extra-axial fluid collection. Orbits are unremarkable. Mild bilateral ethmoid air cell mucosal thickening is present. Mastoid air cells are clear. Visualized distal left vertebral artery flow void is abnormal.  IMPRESSION: 1.  Incomplete examination as above. 2. No evidence of acute infarct or intracranial hemorrhage. 3. Mild chronic small vessel ischemic disease and moderate cerebral atrophy, including bilateral basal ganglia infarcts. 4. Abnormal distal left vertebral artery flow void may reflect high-grade stenosis or occlusion. Dense calcification is present on CT.  Electronically Signed: By: Logan Bores On: 04/02/2013 20:21    Scheduled Meds: . antiseptic oral rinse  15 mL Mouth Rinse BID  . aspirin EC  81 mg Oral Daily  . atorvastatin  10 mg Oral q1800  . carvedilol  6.25 mg Oral BID WC  . clopidogrel  75 mg Oral QHS  . docusate sodium  100 mg Oral BID  . heparin  5,000 Units Subcutaneous Q8H  . lisinopril  10 mg Oral Daily  . PARoxetine  20 mg Oral QPM  . polycarbophil  625 mg Oral Daily  . senna  1 tablet Oral BID   Continuous Infusions: . sodium chloride 50 mL/hr at 04/03/13 0112    Active Problems:   Facial droop   Ataxia   Leukocytosis   CAD (  coronary artery disease)   Hyperlipemia   HTN (hypertension)   Rash   Rini Moffit [employer] Pager [phone]. If 7PM-7AM, please contact night-coverage at [email or web address], password [facility] 04/03/2013, 8:03 AM  LOS: 1 day

## 2013-04-03 NOTE — Progress Notes (Signed)
Subjective: Completely resolved  Exam: Filed Vitals:   04/03/13 1023  BP: 127/58  Pulse:   Temp:   Resp:    Gen: In bed, NAD MS: awake, alert, intertactive and appropriate.  HW:KGSU mild lef facial weakness(old since previous stroke) Motor: 5/5 throughout Sensory:intact to lt  Impression: 73 yo M with transient difficulty walking and worsening of old deficits. I suspect that a lot of this was medication effect from his robaxin, but a "peeling the onion" effect from whatever reaction is causing his rash is also possible. I do not think TIA is likely.   Recommendations: 1)No further neurodiagnostic studies needed at this time, please call if there are any further questions.   Roland Rack, MD [facility] [phone]  If 7pm- 7am, please page neurology on call at [phone].

## 2013-04-03 NOTE — Progress Notes (Signed)
Patient ID: George Vega, male   DOB: 04-29-1940, 73 y.o.   MRN: [id number] Patient seen and evaluated. Patient is status post thumb joint replacement left hand 3 days ago.  He was admitted for the above noted reasons.  Given his white blood cell count there is a question of whether he could have some early infection.  Given these issues I performed a dressing change at bedside. His entire dressing was removed. His surgical incision is clean dry and without discharge your edema or signs of infection. He has excellent range of motion to the fingers. He has excellent sensation. Overall he looks quite well 3 days postop. I then very carefully placed Xeroform over the wound followed by sterile dressing and  a short arm splint.  I discussed his care with his physician. I would recommend Keflex 500 mg 1 by mouth 4 times a day. This is for prophylaxis only given his white count.  I will plan to see him Wednesday at 11 AM.  His wife has my cell phone number-[phone]  Overall I think he is stable from my standpoint but we will keep a very close eye on him due to the events.  Thank you for promptly alerting me to his inpatient admission  Aiman Sonn M.D.

## 2013-04-03 NOTE — Discharge Instructions (Signed)
STROKE/TIA DISCHARGE INSTRUCTIONS SMOKING Cigarette smoking nearly doubles your risk of having a stroke & is the single most alterable risk factor  If you smoke or have smoked in the last 12 months, you are advised to quit smoking for your health.  Most of the excess cardiovascular risk related to smoking disappears within a year of stopping.  Ask you doctor about anti-smoking medications  [place] Quit Line: 1-800-QUIT NOW  Free Smoking Cessation Classes [phone]  CHOLESTEROL Know your levels; limit fat & cholesterol in your diet  Lipid Panel  No results found for this basename: chol, trig, hdl, cholhdl, vldl, ldlcalc      Many patients benefit from treatment even if their cholesterol is at goal.  Goal: Total Cholesterol (CHOL) less than 160  Goal:  Triglycerides (TRIG) less than 150  Goal:  HDL greater than 40  Goal:  LDL (LDLCALC) less than 100   BLOOD PRESSURE American Stroke Association blood pressure target is less that 120/80 mm/Hg  Your discharge blood pressure is:  BP: 127/58 mmHg  Monitor your blood pressure  Limit your salt and alcohol intake  Many individuals will require more than one medication for high blood pressure  DIABETES (A1c is a blood sugar average for last 3 months) Goal HGBA1c is under 7% (HBGA1c is blood sugar average for last 3 months)  Diabetes: Diagnosis of diabetes:  Your A1c:  %    No results found for this basename: HGBA1C     Your HGBA1c can be lowered with medications, healthy diet, and exercise.  Check your blood sugar as directed by your physician  Call your physician if you experience unexplained or low blood sugars.  PHYSICAL ACTIVITY/REHABILITATION Goal is 30 minutes at least 4 days per week  Activity: Increase activity slowly, Therapies:  Return to work:   Activity decreases your risk of heart attack and stroke and makes your heart stronger.  It helps control your weight and blood pressure; helps you relax and can improve your  mood.  Participate in a regular exercise program.  Talk with your doctor about the best form of exercise for you (dancing, walking, swimming, cycling).  DIET/WEIGHT Goal is to maintain a healthy weight  Your discharge diet is: Cardiac thin liquids Your height is:  Height: 5\' 8"  (172.7 cm) Your current weight is: Weight: 97.251 kg (214 lb 6.4 oz) Your Body Mass Index (BMI) is:  BMI (Calculated): 32.7  Following the type of diet specifically designed for you will help prevent another stroke.  Your goal weight range is: 125 - 158  Your goal Body Mass Index (BMI) is 19-24.  Healthy food habits can help reduce 3 risk factors for stroke:  High cholesterol, hypertension, and excess weight.  RESOURCES Stroke/Support Group:  Call [phone]   STROKE EDUCATION PROVIDED/REVIEWED AND GIVEN TO PATIENT Stroke warning signs and symptoms How to activate emergency medical system (call 911). Medications prescribed at discharge. Need for follow-up after discharge. Personal risk factors for stroke. Pneumonia vaccine given: No Flu vaccine given: No My questions have been answered, the writing is legible, and I understand these instructions.  I will adhere to these goals & educational materials that have been provided to me after my discharge from the hospital.      Leukocytosis Leukocytosis means you have more white blood cells than normal. White blood cells are made in your bone marrow. The main job of white blood cells is to fight infection. Having too many white blood cells is a common  condition. It can develop as a result of many types of medical problems. CAUSES  In some cases, your bone marrow may be normal, but it is still making too many white blood cells. This could be the result of:  Infection.  Injury.  Physical stress.  Emotional stress.  Surgery.  Allergic reactions.  Tumors that do not start in the blood or bone marrow.  An inherited disease.  Certain  medicines.  Pregnancy and labor. In other cases, you may have a bone marrow disorder that is causing your body to make too many white blood cells. Bone marrow disorders include:  Leukemia. This is a type of blood cancer.  Myeloproliferative disorders. These disorders cause blood cells to grow abnormally. SYMPTOMS  Some people have no symptoms. Others have symptoms due to the medical problem that is causing their leukocytosis. These symptoms may include:  Bleeding.  Bruising.  Fever.  Night sweats.  Repeated infections.  Weakness.  Weight loss. DIAGNOSIS  Leukocytosis is often found during blood tests that are done as part of a normal physical exam. Your caregiver will probably order other tests to help determine why you have too many white blood cells. These tests may include:  A complete blood count (CBC). This test measures all the types of blood cells in your body.  Chest X-rays, urine tests (urinalysis), or other tests to look for signs of infection.  Bone marrow aspiration. For this test, a needle is put into your bone. Cells from the bone marrow are removed through the needle. The cells are then examined under a microscope. TREATMENT  Treatment is usually not needed for leukocytosis. However, if a disorder is causing your leukocytosis, it will need to be treated. Treatment may include:  Antibiotic medicines if you have a bacterial infection.  Bone marrow transplant. Your diseased bone marrow is replaced with healthy cells that will grow new bone marrow.  Chemotherapy. This is the use of drugs to kill cancer cells. HOME CARE INSTRUCTIONS  Only take over-the-counter or prescription medicines as directed by your caregiver.  Maintain a healthy weight. Ask your caregiver what weight is best for you.  Eat foods that are low in saturated fats and high in fiber. Eat plenty of fruits and vegetables.  Drink enough fluids to keep your urine clear or pale yellow.  Get 30  minutes of exercise at least 5 times a week. Check with your caregiver before starting a new exercise routine.  Limit caffeine and alcohol.  Do not smoke.  Keep all follow-up appointments as directed by your caregiver. SEEK MEDICAL CARE IF:  You feel weak or more tired than usual.  You develop chills, a cough, or nasal congestion.  You lose weight without trying.  You have night sweats.  You bruise easily. SEEK IMMEDIATE MEDICAL CARE IF:  You bleed more than normal.  You have chest pain.  You have trouble breathing.  You have a fever.  You have uncontrolled nausea or vomiting.  You feel dizzy or lightheaded. MAKE SURE YOU:  Understand these instructions.  Will watch your condition.  Will get help right away if you are not doing well or get worse. Document Released: 02/15/2011 Document Revised: 05/21/2011 Document Reviewed: 02/15/2011 [facility] Patient Information 2014 [place], [place].  STROKE/TIA DISCHARGE INSTRUCTIONS SMOKING Cigarette smoking nearly doubles your risk of having a stroke & is the single most alterable risk factor  If you smoke or have smoked in the last 12 months, you are advised to quit smoking for  your health.  Most of the excess cardiovascular risk related to smoking disappears within a year of stopping.  Ask you doctor about anti-smoking medications  [place] Quit Line: 1-800-QUIT NOW  Free Smoking Cessation Classes [phone]  CHOLESTEROL Know your levels; limit fat & cholesterol in your diet  Lipid Panel  No results found for this basename: chol, trig, hdl, cholhdl, vldl, ldlcalc      Many patients benefit from treatment even if their cholesterol is at goal.  Goal: Total Cholesterol (CHOL) less than 160  Goal:  Triglycerides (TRIG) less than 150  Goal:  HDL greater than 40  Goal:  LDL (LDLCALC) less than 100   BLOOD PRESSURE American Stroke Association blood pressure target is less that 120/80 mm/Hg  Your discharge blood pressure  is:  BP: 127/58 mmHg  Monitor your blood pressure  Limit your salt and alcohol intake  Many individuals will require more than one medication for high blood pressure  DIABETES (A1c is a blood sugar average for last 3 months) Goal HGBA1c is under 7% (HBGA1c is blood sugar average for last 3 months)  Diabetes: No known diagnosis of diabetes    No results found for this basename: HGBA1C     Your HGBA1c can be lowered with medications, healthy diet, and exercise.  Check your blood sugar as directed by your physician  Call your physician if you experience unexplained or low blood sugars.  PHYSICAL ACTIVITY/REHABILITATION Goal is 30 minutes at least 4 days per week  DIET/WEIGHT Goal is to maintain a healthy weight  Your discharge diet is: Cardiac thin  liquids Your height is:  Height: 5\' 8"  (172.7 cm) Your current weight is: Weight: 97.251 kg (214 lb 6.4 oz) Your Body Mass Index (BMI) is:  BMI (Calculated): 32.7  Following the type of diet specifically designed for you will help prevent another stroke.  Your goal weight range is:  122 - 153    Your goal Body Mass Index (BMI) is 19-24.  Healthy food habits can help reduce 3 risk factors for stroke:  High cholesterol, hypertension, and excess weight.  RESOURCES Stroke/Support Group:  Call [phone]   STROKE EDUCATION PROVIDED/REVIEWED AND GIVEN TO PATIENT Stroke warning signs and symptoms How to activate emergency medical system (call 911). Medications prescribed at discharge. Need for follow-up after discharge. Personal risk factors for stroke. Pneumonia vaccine given: No Flu vaccine given: No My questions have been answered, the writing is legible, and I understand these instructions.  I will adhere to these goals & educational materials that have been provided to me after my discharge from the hospital.

## 2013-04-03 NOTE — Discharge Summary (Signed)
Physician Discharge Summary  MITHRAN STRIKE RXV:[id number] DOB: [birth year] DOA: 04/02/2013  PCP: Pcp Not In System Orthopedist: Dr. Amedeo Plenty  Admit date: 04/02/2013 Discharge date: 04/03/2013  Recommendations for Outpatient Follow-up:  1. Please check CBC to be sure it is back to normal  Discharge Diagnoses:    Facial droop - resolved   Ataxia   Leukocytosis   CAD (coronary artery disease)   Hyperlipemia   HTN (hypertension)   Rash  Discharge Condition: stable   Diet recommendation: heart healthy, carb modified  Filed Weights   04/03/13 0006  Weight: 214 lb 6.4 oz (97.251 kg)    History of present illness:  George Vega is a 73 y.o. male, with past medical history CVA with left-sided facial droop, patient presents to ED today as he was found to have left-sided facial droop by his family when he woke up from a nap around 3 PM, wife at bedside and she gives a history, report she noticed some droop around his left eye, but denies any droop around the mouth on the cheek, as well denies any slurred speech, patient denies any dysphagia, patient was seen by in urology in ED, and had MRI done which did not show any acute findings, as well patient reports some ataxia, after he took his Robaxin refer having a nap, as well patient developed some rash in the lower extremities, he reports today he started to take vitamin B supplements and vitamin C and Robaxin, patient was found to have leukocytosis of 18,000, he denies any polyuria dysuria cough productive sputum fever or chills at home, patient reports he had recent surgery by [facility] orthopedics, 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  Hospital Course:  1. left facial droop and ataxia, - completely resolved now. it was not significant upon presentation, and it involved mainly the eye, no other symptoms, the patient in  neurologic workup is negative, ataxia most likely related to Robaxin. Pt has restarted plavix daily. MRI negative for acute CVA.  Per neuro, no stroke work up needed if MRI negative.    2. Leukocytosis. The patient is afebrile, etiology is unclear, blood cultures negative. Check chest x-ray and urine negative for infection, will defer on starting any antibiotic pending the workup, as well given the fact patient had recent hand surgery, and currently is covered with a cast, Dr. Sherren Kerns orthopedic to evaluate the wound and found no signs of infection.  He recommended pt take a course of keflex 500 mg po QID and will see patient in the office in a few short days.  Also pt has his phone number.    3. Coronary artery disease. Patient denies chest pain or shortness of breath, continue with aspirin, Plavix, statin, beta blockers,   4. Hypertension. Blood pressure is stable, continue with home medication   5. Rash. Most likely related to the new medication he started including Robaxin, vitamin C, and vitamin B, resolved after discontinued robaxin   DVT Prophylaxis Heparin   Family Communication: Admission, patients condition and plan of care including tests being ordered have been discussed with the patient and wife who indicate understanding and agree with the plan and Code Status.   Code Status full  Condition GUARDED  Procedures:  MRI  Consultations:  Orthopedics Amedeo Plenty)  Neurology Leonel Ramsay)  Discharge Exam: Filed Vitals:   04/03/13 1023  BP: 127/58  Pulse:   Temp:   Resp:    Discharge Instructions  Discharge Orders   Future Orders Complete By Expires   Call MD for:  persistant nausea and vomiting  As directed    Call MD for:  severe uncontrolled pain  As directed    Call MD for:  temperature >100.4  As directed    Diet - low sodium heart healthy  As directed    Discharge instructions  As directed    Comments:     Return if symptoms recur, worsen or new problems  develop.  See your orthopedist on Wednesday as scheduled   Discontinue IV  As directed    Increase activity slowly  As directed        Medication List    STOP taking these medications       methocarbamol 500 MG tablet  Commonly known as:  ROBAXIN      TAKE these medications       aspirin 81 MG tablet  Take 81 mg by mouth daily.     carvedilol 6.25 MG tablet  Commonly known as:  COREG  Take 6.25 mg by mouth 2 (two) times daily with a meal.     cephALEXin 500 MG capsule  Commonly known as:  KEFLEX  Take 1 capsule (500 mg total) by mouth 4 (four) times daily.     clopidogrel 75 MG tablet  Commonly known as:  PLAVIX  Take 75 mg by mouth at bedtime.     fluticasone 50 MCG/ACT nasal spray  Commonly known as:  FLONASE  Place 2 sprays into the nose daily as needed for rhinitis or allergies.     lisinopril 10 MG tablet  Commonly known as:  PRINIVIL,ZESTRIL  Take 10 mg by mouth daily.     ondansetron 8 MG disintegrating tablet  Commonly known as:  ZOFRAN-ODT  Take 8 mg by mouth as needed. Nausea     oxyCODONE 5 MG immediate release tablet  Commonly known as:  Oxy IR/ROXICODONE  Take 5 mg by mouth once as needed.     PARoxetine 20 MG tablet  Commonly known as:  PAXIL  Take 20 mg by mouth every evening.     polycarbophil 625 MG tablet  Commonly known as:  FIBERCON  Take 625 mg by mouth daily.     rosuvastatin 5 MG tablet  Commonly known as:  CRESTOR  Take 5 mg by mouth daily.       Allergies  Allergen Reactions  . Tetanus Toxoid Anaphylaxis and Hives    REACTION: hives/throat swells shut  . Fish Oil     cramping  . Glucosamine     cramping       Follow-up Information   Follow up with Paulene Floor, MD. Schedule an appointment as soon as possible for a visit in 5 days. (as scheduled for followup )    Specialty:  Orthopedic Surgery   Contact information:   [address] [place] [address] [place] [address] [phone]      The results of  significant diagnostics from this hospitalization (including imaging, microbiology, ancillary and laboratory) are listed below for reference.    Significant Diagnostic Studies: X-ray Chest Pa And Lateral   04/03/2013   CLINICAL DATA:  Elevated white blood cell count  EXAM: CHEST  2 VIEW  COMPARISON:  PA and lateral chest x-ray of October 13, 2008.  FINDINGS: The lungs are mildly hyperinflated. There is no focal infiltrate. The interstitial markings are  mildly prominent though stable. The cardiopericardial silhouette is normal in size. The pulmonary vascularity is not engorged. There is no pleural effusion or pneumothorax. The observed portions of the bony thorax appear normal. 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  IMPRESSION: There is no evidence of pneumonia nor pleural effusion nor other acute cardiopulmonary abnormality. There is mild hyperinflation and mild stable prominence of the interstitial markings bilaterally consistent with COPD.   Electronically Signed   By: [name]   On: 04/03/2013 08:58   Ct Head (brain) Wo Contrast  04/02/2013   CLINICAL DATA:  Left-sided facial droop.  EXAM: CT HEAD WITHOUT CONTRAST  TECHNIQUE: Contiguous axial images were obtained from the base of the skull through the vertex without intravenous contrast.  COMPARISON:  10/02/2004.  FINDINGS: No intracranial hemorrhage.  Remote infarct right caudate head.  Small vessel disease type changes.  Pontine infarct cannot be excluded. This appearance may be related to streak artifact.  Global atrophy. This has progressed since the prior exam. The ventricular prominence probably is related to atrophy rather than hydrocephalus.  No intracranial mass lesion noted on this unenhanced exam.  Vascular calcifications.  Mild mucosal thickening/ partial opacification ethmoid sinus air cells and right maxillary sinus.  IMPRESSION: No intracranial hemorrhage.  Remote infarct right caudate head.  Small vessel disease type changes.   Pontine infarct cannot be excluded. This appearance may be related to streak artifact.  Global atrophy  These results were called by telephone at the time of interpretation on 04/02/2013 at 5:51 PM to Dr. Leonel Ramsay who verbally acknowledged these results.   Electronically Signed   By: Chauncey Cruel M.D.   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   Electronically Signed   By: Logan Bores   On: 04/02/2013 20:41   04/02/2013   CLINICAL DATA:  History of previous stroke with left-sided facial droop. Recently had hand surgery and was noted to have worsening of facial droop and difficulty walking.  EXAM: MRI HEAD WITHOUT CONTRAST  TECHNIQUE: Multiplanar, multiecho pulse sequences of the brain and surrounding structures were obtained without intravenous contrast.  COMPARISON:  Head CT 04/02/2013  FINDINGS: The patient experienced heating in his wrist during the examination and the examination was terminated at that point. Axial T1 and coronal T2 weighted images were not obtained.  There is no evidence of acute infarct or intracranial hemorrhage. Periventricular T2 hyperintensities are nonspecific but compatible with mild chronic small vessel ischemic disease. There is moderate cerebral atrophy. 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 There is ex vacuo dilatation of the right frontal horn due to the right caudate infarct. There is no evidence of mass, midline shift, or extra-axial fluid collection. Orbits are unremarkable. Mild bilateral ethmoid air cell mucosal thickening is present. Mastoid air cells are clear. Visualized distal left vertebral artery flow void is abnormal.  IMPRESSION: 1. Incomplete examination as above. 2. No evidence of acute infarct or intracranial hemorrhage. 3. Mild chronic small vessel ischemic  disease and moderate cerebral atrophy, including bilateral basal ganglia infarcts. 4. Abnormal distal left vertebral artery flow void may reflect high-grade stenosis or occlusion. Dense calcification is present on CT.  Electronically Signed: By: Logan Bores On: 04/02/2013 20:21   Microbiology: No results found for this or any previous visit (from the past 240 hour(s)).   Labs:  Basic Metabolic Panel:  Recent Labs Lab 04/02/13 1722 04/03/13 0115  NA 135* 137  K 4.3 4.2  CL 97 99  CO2 23 25  GLUCOSE 113* 122*  BUN 12 12  CREATININE 0.90 0.93  CALCIUM 8.9 9.1   Liver Function Tests:  Recent Labs Lab 04/02/13 1722  AST 38*  ALT 33  ALKPHOS 87  BILITOT 1.1  PROT 6.8  ALBUMIN 3.6   No results found for this basename: LIPASE, AMYLASE,  in the last 168 hours No results found for this basename: AMMONIA,  in the last 168 hours CBC:  Recent Labs Lab 04/02/13 1722 04/03/13 0115  WBC 18.1* 17.3*  NEUTROABS 16.2*  --   HGB 14.7 13.5  HCT 42.2 39.5  MCV 94.8 94.7  PLT 195 200   Cardiac Enzymes:  Recent Labs Lab 04/02/13 1722  TROPONINI <0.30   BNP: BNP (last 3 results) No results found for this basename: PROBNP,  in the last 8760 hours CBG:  Recent Labs Lab 04/02/13 1839  GLUCAP 104*   Signed:  [facility] 04/03/2013, 1:49 PM

## 2013-04-03 NOTE — Progress Notes (Signed)
UR completed 

## 2013-04-09 LAB — CULTURE, BLOOD (ROUTINE X 2)
Culture: NO GROWTH
Culture: NO GROWTH

## 2013-04-23 ENCOUNTER — Encounter: Payer: Self-pay | Admitting: Gastroenterology

## 2013-08-26 ENCOUNTER — Emergency Department (HOSPITAL_COMMUNITY): Payer: Medicare Other

## 2013-08-26 ENCOUNTER — Encounter (HOSPITAL_COMMUNITY): Payer: Self-pay | Admitting: Radiology

## 2013-08-26 ENCOUNTER — Emergency Department (HOSPITAL_COMMUNITY)
Admission: EM | Admit: 2013-08-26 | Discharge: 2013-08-27 | Disposition: A | Discharge status: Home / Self Care | Payer: Medicare Other | Attending: Emergency Medicine | Admitting: Emergency Medicine

## 2013-08-26 DIAGNOSIS — Z8709 Personal history of other diseases of the respiratory system: Secondary | ICD-10-CM | POA: Insufficient documentation

## 2013-08-26 DIAGNOSIS — W1809XA Striking against other object with subsequent fall, initial encounter: Secondary | ICD-10-CM | POA: Insufficient documentation

## 2013-08-26 DIAGNOSIS — Y9389 Activity, other specified: Secondary | ICD-10-CM | POA: Insufficient documentation

## 2013-08-26 DIAGNOSIS — IMO0002 Reserved for concepts with insufficient information to code with codable children: Secondary | ICD-10-CM | POA: Insufficient documentation

## 2013-08-26 DIAGNOSIS — Z7982 Long term (current) use of aspirin: Secondary | ICD-10-CM | POA: Insufficient documentation

## 2013-08-26 DIAGNOSIS — S1093XA Contusion of unspecified part of neck, initial encounter: Secondary | ICD-10-CM

## 2013-08-26 DIAGNOSIS — Z79899 Other long term (current) drug therapy: Secondary | ICD-10-CM | POA: Insufficient documentation

## 2013-08-26 DIAGNOSIS — Z87891 Personal history of nicotine dependence: Secondary | ICD-10-CM | POA: Insufficient documentation

## 2013-08-26 DIAGNOSIS — Z7902 Long term (current) use of antithrombotics/antiplatelets: Secondary | ICD-10-CM | POA: Insufficient documentation

## 2013-08-26 DIAGNOSIS — M129 Arthropathy, unspecified: Secondary | ICD-10-CM | POA: Insufficient documentation

## 2013-08-26 DIAGNOSIS — S0101XA Laceration without foreign body of scalp, initial encounter: Secondary | ICD-10-CM

## 2013-08-26 DIAGNOSIS — R42 Dizziness and giddiness: Secondary | ICD-10-CM | POA: Insufficient documentation

## 2013-08-26 DIAGNOSIS — F329 Major depressive disorder, single episode, unspecified: Secondary | ICD-10-CM | POA: Insufficient documentation

## 2013-08-26 DIAGNOSIS — S0003XA Contusion of scalp, initial encounter: Secondary | ICD-10-CM | POA: Insufficient documentation

## 2013-08-26 DIAGNOSIS — Z9889 Other specified postprocedural states: Secondary | ICD-10-CM | POA: Insufficient documentation

## 2013-08-26 DIAGNOSIS — Z8719 Personal history of other diseases of the digestive system: Secondary | ICD-10-CM | POA: Insufficient documentation

## 2013-08-26 DIAGNOSIS — F3289 Other specified depressive episodes: Secondary | ICD-10-CM | POA: Insufficient documentation

## 2013-08-26 DIAGNOSIS — S0083XA Contusion of other part of head, initial encounter: Secondary | ICD-10-CM | POA: Insufficient documentation

## 2013-08-26 DIAGNOSIS — S0990XA Unspecified injury of head, initial encounter: Secondary | ICD-10-CM

## 2013-08-26 DIAGNOSIS — I2510 Atherosclerotic heart disease of native coronary artery without angina pectoris: Secondary | ICD-10-CM | POA: Insufficient documentation

## 2013-08-26 DIAGNOSIS — I10 Essential (primary) hypertension: Secondary | ICD-10-CM | POA: Insufficient documentation

## 2013-08-26 DIAGNOSIS — S0100XA Unspecified open wound of scalp, initial encounter: Secondary | ICD-10-CM | POA: Insufficient documentation

## 2013-08-26 DIAGNOSIS — Y929 Unspecified place or not applicable: Secondary | ICD-10-CM | POA: Insufficient documentation

## 2013-08-26 MED ORDER — OXYCODONE-ACETAMINOPHEN 5-325 MG PO TABS
2.0000 | ORAL_TABLET | Freq: Once | ORAL | Status: AC
  Administered 2013-08-26: 2 via ORAL
  Filled 2013-08-26: qty 2

## 2013-08-26 NOTE — Discharge Instructions (Signed)
Dizziness °Dizziness is a common problem. It is a feeling of unsteadiness or light-headedness. You may feel like you are about to faint. Dizziness can lead to injury if you stumble or fall. A person of any age group can suffer from dizziness, but dizziness is more common in older adults. °CAUSES  °Dizziness can be caused by many different things, including: °· Middle ear problems. °· Standing for too long. °· Infections. °· An allergic reaction. °· Aging. °· An emotional response to something, such as the sight of blood. °· Side effects of medicines. °· Tiredness. °· Problems with circulation or blood pressure. °· Excessive use of alcohol or medicines, or illegal drug use. °· Breathing too fast (hyperventilation). °· An irregular heart rhythm (arrhythmia). °· A low red blood cell count (anemia). °· Pregnancy. °· Vomiting, diarrhea, fever, or other illnesses that cause body fluid loss (dehydration). °· Diseases or conditions such as Parkinson's disease, high blood pressure (hypertension), diabetes, and thyroid problems. °· Exposure to extreme heat. °DIAGNOSIS  °Your health care provider will ask about your symptoms, perform a physical exam, and perform an electrocardiogram (ECG) to record the electrical activity of your heart. Your health care provider may also perform other heart or blood tests to determine the cause of your dizziness. These may include: °· Transthoracic echocardiogram (TTE). During echocardiography, sound waves are used to evaluate how blood flows through your heart. °· Transesophageal echocardiogram (TEE). °· Cardiac monitoring. This allows your health care provider to monitor your heart rate and rhythm in real time. °· Holter monitor. This is a portable device that records your heartbeat and can help diagnose heart arrhythmias. It allows your health care provider to track your heart activity for several days if needed. °· Stress tests by exercise or by giving medicine that makes the heart beat  faster. °TREATMENT  °Treatment of dizziness depends on the cause of your symptoms and can vary greatly. °HOME CARE INSTRUCTIONS  °· Drink enough fluids to keep your urine clear or pale yellow. This is especially important in very hot weather. In older adults, it is also important in cold weather. °· Take your medicine exactly as directed if your dizziness is caused by medicines. When taking blood pressure medicines, it is especially important to get up slowly. °¨ Rise slowly from chairs and steady yourself until you feel okay. °¨ In the morning, first sit up on the side of the bed. When you feel okay, stand slowly while holding onto something until you know your balance is fine. °· Move your legs often if you need to stand in one place for a long time. Tighten and relax your muscles in your legs while standing. °· Have someone stay with you for 1-2 days if dizziness continues to be a problem. Do this until you feel you are well enough to stay alone. Have the person call your health care provider if he or she notices changes in you that are concerning. °· Do not drive or use heavy machinery if you feel dizzy. °· Do not drink alcohol. °SEEK IMMEDIATE MEDICAL CARE IF:  °· Your dizziness or light-headedness gets worse. °· You feel nauseous or vomit. °· You have problems talking, walking, or using your arms, hands, or legs. °· You feel weak. °· You are not thinking clearly or you have trouble forming sentences. It may take a friend or family member to notice this. °· You have chest pain, abdominal pain, shortness of breath, or sweating. °· Your vision changes. °· You notice   any bleeding.  You have side effects from medicine that seems to be getting worse rather than better. MAKE SURE YOU:   Understand these instructions.  Will watch your condition.  Will get help right away if you are not doing well or get worse. Document Released: 08/22/2000 Document Revised: 03/03/2013 Document Reviewed: 09/15/2010 [facility]  Patient Information 2015 [place], [place]. This information is not intended to replace advice given to you by your health care provider. Make sure you discuss any questions you have with your health care provider.  Head Injury You have received a head injury. It does not appear serious at this time. Headaches and vomiting are common following head injury. It should be easy to awaken from sleeping. Sometimes it is necessary for you to stay in the emergency department for a while for observation. Sometimes admission to the hospital may be needed. After injuries such as yours, most problems occur within the first 24 hours, but side effects may occur up to 7-10 days after the injury. It is important for you to carefully monitor your condition and contact your health care provider or seek immediate medical care if there is a change in your condition. WHAT ARE THE TYPES OF HEAD INJURIES? Head injuries can be as minor as a bump. Some head injuries can be more severe. More severe head injuries include:  A jarring injury to the brain (concussion).  A bruise of the brain (contusion). This mean there is bleeding in the brain that can cause swelling.  A cracked skull (skull fracture).  Bleeding in the brain that collects, clots, and forms a bump (hematoma). WHAT CAUSES A HEAD INJURY? A serious head injury is most likely to happen to someone who is in a car wreck and is not wearing a seat belt. Other causes of major head injuries include bicycle or motorcycle accidents, sports injuries, and falls. HOW ARE HEAD INJURIES DIAGNOSED? A complete history of the event leading to the injury and your current symptoms will be helpful in diagnosing head injuries. Many times, pictures of the brain, such as CT or MRI are needed to see the extent of the injury. Often, an overnight hospital stay is necessary for observation.  WHEN SHOULD I SEEK IMMEDIATE MEDICAL CARE?  You should get help right away if:  You have confusion  or drowsiness.  You feel sick to your stomach (nauseous) or have continued, forceful vomiting.  You have dizziness or unsteadiness that is getting worse.  You have severe, continued headaches not relieved by medicine. Only take over-the-counter or prescription medicines for pain, fever, or discomfort as directed by your health care provider.  You do not have normal function of the arms or legs or are unable to walk.  You notice changes in the black spots in the center of the colored part of your eye (pupil).  You have a clear or bloody fluid coming from your nose or ears.  You have a loss of vision. During the next 24 hours after the injury, you must stay with someone who can watch you for the warning signs. This person should contact local emergency services (911 in the [place] if you have seizures, you become unconscious, or you are unable to wake up. HOW CAN I PREVENT A HEAD INJURY IN THE FUTURE? The most important factor for preventing major head injuries is avoiding motor vehicle accidents. To minimize the potential for damage to your head, it is crucial to wear seat belts while riding in motor vehicles. Wearing helmets  while bike riding and playing collision sports (like football) is also helpful. Also, avoiding dangerous activities around the house will further help reduce your risk of head injury.  WHEN CAN I RETURN TO NORMAL ACTIVITIES AND ATHLETICS? You should be reevaluated by your health care provider before returning to these activities. If you have any of the following symptoms, you should not return to activities or contact sports until 1 week after the symptoms have stopped:  Persistent headache.  Dizziness or vertigo.  Poor attention and concentration.  Confusion.  Memory problems.  Nausea or vomiting.  Fatigue or tire easily.  Irritability.  Intolerant of bright lights or loud noises.  Anxiety or depression.  Disturbed sleep. MAKE SURE YOU:   Understand  these instructions.  Will watch your condition.  Will get help right away if you are not doing well or get worse. Document Released: 02/26/2005 Document Revised: 03/03/2013 Document Reviewed: 11/03/2012 [facility] Patient Information 2015 [place], [place]. This information is not intended to replace advice given to you by your health care provider. Make sure you discuss any questions you have with your health care provider.  Laceration Care, Adult A laceration is a cut or lesion that goes through all layers of the skin and into the tissue just beneath the skin. TREATMENT  Some lacerations may not require closure. Some lacerations may not be able to be closed due to an increased risk of infection. It is important to see your caregiver as soon as possible after an injury to minimize the risk of infection and maximize the opportunity for successful closure. If closure is appropriate, pain medicines may be given, if needed. The wound will be cleaned to help prevent infection. Your caregiver will use stitches (sutures), staples, wound glue (adhesive), or skin adhesive strips to repair the laceration. These tools bring the skin edges together to allow for faster healing and a better cosmetic outcome. However, all wounds will heal with a scar. Once the wound has healed, scarring can be minimized by covering the wound with sunscreen during the day for 1 full year. HOME CARE INSTRUCTIONS  For sutures or staples:  Keep the wound clean and dry.  If you were given a bandage (dressing), you should change it at least once a day. Also, change the dressing if it becomes wet or dirty, or as directed by your caregiver.  Wash the wound with soap and water 2 times a day. Rinse the wound off with water to remove all soap. Pat the wound dry with a clean towel.  After cleaning, apply a thin layer of the antibiotic ointment as recommended by your caregiver. This will help prevent infection and keep the dressing from  sticking.  You may shower as usual after the first 24 hours. Do not soak the wound in water until the sutures are removed.  Only take over-the-counter or prescription medicines for pain, discomfort, or fever as directed by your caregiver.  Get your sutures or staples removed as directed by your caregiver. For skin adhesive strips:  Keep the wound clean and dry.  Do not get the skin adhesive strips wet. You may bathe carefully, using caution to keep the wound dry.  If the wound gets wet, pat it dry with a clean towel.  Skin adhesive strips will fall off on their own. You may trim the strips as the wound heals. Do not remove skin adhesive strips that are still stuck to the wound. They will fall off in time. For wound adhesive:  You may briefly wet your wound in the shower or bath. Do not soak or scrub the wound. Do not swim. Avoid periods of heavy perspiration until the skin adhesive has fallen off on its own. After showering or bathing, gently pat the wound dry with a clean towel.  Do not apply liquid medicine, cream medicine, or ointment medicine to your wound while the skin adhesive is in place. This may loosen the film before your wound is healed.  If a dressing is placed over the wound, be careful not to apply tape directly over the skin adhesive. This may cause the adhesive to be pulled off before the wound is healed.  Avoid prolonged exposure to sunlight or tanning lamps while the skin adhesive is in place. Exposure to ultraviolet light in the first year will darken the scar.  The skin adhesive will usually remain in place for 5 to 10 days, then naturally fall off the skin. Do not pick at the adhesive film. You may need a tetanus shot if:  You cannot remember when you had your last tetanus shot.  You have never had a tetanus shot. If you get a tetanus shot, your arm may swell, get red, and feel warm to the touch. This is common and not a problem. If you need a tetanus shot and  you choose not to have one, there is a rare chance of getting tetanus. Sickness from tetanus can be serious. SEEK MEDICAL CARE IF:   You have redness, swelling, or increasing pain in the wound.  You see a red line that goes away from the wound.  You have yellowish-white fluid (pus) coming from the wound.  You have a fever.  You notice a bad smell coming from the wound or dressing.  Your wound breaks open before or after sutures have been removed.  You notice something coming out of the wound such as wood or glass.  Your wound is on your hand or foot and you cannot move a finger or toe. SEEK IMMEDIATE MEDICAL CARE IF:   Your pain is not controlled with prescribed medicine.  You have severe swelling around the wound causing pain and numbness or a change in color in your arm, hand, leg, or foot.  Your wound splits open and starts bleeding.  You have worsening numbness, weakness, or loss of function of any joint around or beyond the wound.  You develop painful lumps near the wound or on the skin anywhere on your body. MAKE SURE YOU:   Understand these instructions.  Will watch your condition.  Will get help right away if you are not doing well or get worse. Document Released: 02/26/2005 Document Revised: 05/21/2011 Document Reviewed: 08/22/2010 [facility] Patient Information 2015 [place], [place]. This information is not intended to replace advice given to you by your health care provider. Make sure you discuss any questions you have with your health care provider.

## 2013-08-26 NOTE — ED Notes (Signed)
The patient is complaining of dizziness; while lyging in the bed. The tech has reported to the RN in charge.

## 2013-08-26 NOTE — ED Notes (Addendum)
Patient arrived via [facility] from home post fall. Patient became nauseated and went to stand up and became dizzy. Patient fell and struck his head on a dresser. He has a laceration and hematoma to his right side of his head and a small laceration to the frontal area of his head. Bleeding is controlled. Patient is very hypertensive. 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 No LOC. EMS administered Zofran 4mg  IV. EKG NSR. A/O.

## 2013-08-26 NOTE — ED Notes (Signed)
Dr. Eulis Foster made aware of pt BP and HA. Pt axo x4.

## 2013-08-26 NOTE — ED Provider Notes (Signed)
CSN: [id number]     Arrival date & time 08/26/13  1832 History   First MD Initiated Contact with Patient 08/26/13 1902     Chief Complaint  Patient presents with  . Fall     (Consider location/radiation/quality/duration/timing/severity/associated sxs/prior Treatment) Patient is a 73 y.o. male presenting with fall.  Fall   The patient, stood up from his bed, fell, dizzy and fell, striking his head on a cabinet. He did not lose consciousness. He has been dizzy all day, preceding the fall and after the fall. He has intermittent episodes of dizziness, which caused him to fall. He has had increased episodes of dizziness recently. His last fall, secondary to dizziness, was one week ago, when he did not injure himself. 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 He did not injure his neck or back, arms or legs ithe fall today. He has not taken any medications for this pain, and there are no other known modifying factors.  Past Medical History  Diagnosis Date  . Arthritis   . Spastic colon   . Chronic sinusitis   . Coronary artery disease   . Hypertension   . Depression   . Difficult intubation     was told with shoulder done 2006-alittle narrow   Past Surgical History  Procedure Laterality Date  . Uvulopalatopharyngoplasty    . 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 Nasal sinus surgery    . Shoulder arthroscopy  03/01/2011    Procedure: ARTHROSCOPY SHOULDER;  Surgeon: Ninetta Lights, MD;  Location: [place];  Service: Orthopedics;  Laterality: Right;  arthroscopy shoulder decompression subacromial partial acromioplasty with coracoacromial release, distal claviculectomy, debridement of labrium  . Foot neuroma surgery  2002  . Shoulder arthroscopy  03/01/2011    Procedure: ARTHROSCOPY SHOULDER;  Surgeon: Ninetta Lights, MD;  Location: [place];  Service: Orthopedics;  Laterality: Right;  arthroscopy shoulder decompression subacromial partial acromioplasty with coracoacromial release, distal claviculectomy, debridement of labrium  . Cardiac catheterization  10/2008    normal  . Cardiac catheterization  4/13    no chg  . Total shoulder arthroplasty  06/28/2011    Procedure: TOTAL SHOULDER ARTHROPLASTY;  Surgeon: Ninetta Lights, MD;  Location: [place];  Service: Orthopedics;  Laterality: Right;   No family history on file. History  Substance Use Topics  . Smoking status: Former Smoker    Quit date: 02/27/1998  . Smokeless tobacco: Not on file  . Alcohol Use:     Review of Systems  All other systems reviewed and are negative.     Allergies  Tetanus toxoid; Fish oil; Glucosamine; and Ibuprofen  Home Medications   Prior to Admission medications   Medication Sig Start Date End Date Taking? Authorizing Provider  acetaminophen (TYLENOL) 325 MG tablet Take 650 mg by mouth every 6 (six) hours as needed for moderate pain.   Yes Historical Provider, MD  aspirin 81 MG tablet Take 81 mg by mouth daily.     Yes Historical Provider, MD  carvedilol (COREG) 6.25 MG tablet Take 6.25 mg by mouth 2 (two) times daily with a meal.   Yes Historical Provider, MD  clopidogrel (PLAVIX) 75 MG tablet Take 75 mg by mouth at bedtime.    Yes Historical Provider, MD  lisinopril (PRINIVIL,ZESTRIL) 10 MG tablet Take 10 mg by mouth daily.   Yes Historical Provider, MD  PARoxetine (PAXIL) 20 MG tablet Take 20 mg by mouth every evening.     Yes Historical Provider, MD  polycarbophil (FIBERCON) 625 MG tablet Take 625 mg by mouth daily.     Yes Historical Provider, MD  rosuvastatin (CRESTOR) 5 MG tablet Take 5 mg by mouth every morning.    Yes Historical Provider, MD   BP 168/99  Pulse 73  Temp(Src) 97.5 F (36.4 C) (Oral)  Resp 18  Ht 5\' 10"  (1.778 m)  Wt 209 lb (94.802 kg)  BMI  29.99 kg/m2  SpO2 94% Physical Exam  Nursing note and vitals reviewed. Constitutional: He is oriented to person, place, and time. He appears well-developed and well-nourished.  HENT:  Head: Normocephalic.  Right Ear: External ear normal.  Left Ear: External ear normal.  5 contusion with ecchymosis, right forehead, associated with a small abrasion. Flap laceration, central frontal scalp, superficial, but gaping somewhat. There is no associated crepitation with either injury to the head.  Eyes: Conjunctivae and EOM are normal. Pupils are equal, round, and reactive to light.  Neck: Normal range of motion and phonation normal. Neck supple.  Cardiovascular: Normal rate, regular rhythm, normal heart sounds and intact distal pulses.   Pulmonary/Chest: Effort normal and breath sounds normal. He exhibits no bony tenderness.  Abdominal: Soft. There is no tenderness.  Musculoskeletal: Normal range of motion.  Neck is nontender to palpation.  Neurological: He is alert and oriented to person, place, and time. No cranial nerve deficit or sensory deficit. He exhibits normal muscle tone. Coordination normal.  No dysarthria, aphasia or nystagmus  Skin: Skin is warm, dry and intact.  Psychiatric: He has a normal mood and affect. His behavior is normal. Judgment and thought content normal.    ED Course  Procedures (including critical care time)  Medications  oxyCODONE-acetaminophen (PERCOCET/ROXICET) 5-325 MG per tablet 2 tablet (2 tablets Oral Given 08/26/13 2019)    Patient Vitals for the past 24 hrs:  BP Temp Temp src Pulse Resp SpO2 Height Weight  08/26/13 2345 168/99 mmHg - - 73 18 94 % - -  08/26/13 2317 149/101 mmHg - - 73 22 96 % - -  08/26/13 2300 147/98 mmHg - - 71 16 94 % - -  08/26/13 2115 172/108 mmHg - - 77 21 95 % - -  08/26/13 2100 158/106 mmHg - - 74 21 95 % - -  08/26/13 2045 159/111 mmHg - - 73 19 96 % - -  08/26/13 2030 181/106 mmHg - - 51 24 97 % - -  08/26/13 2015 173/111  mmHg - - 71 17 94 % - -  08/26/13 2000 143/105 mmHg - - 75 20 95 % - -  08/26/13 1953 155/111 mmHg - - - - 95 % - -  08/26/13 1945 155/111 mmHg - - 78 25 94 % - -  08/26/13 1939 164/100 mmHg - - - - - - -  08/26/13 1930 164/100 mmHg - - 74 20 96 % - -  08/26/13 1915 163/114 mmHg - - 70 19 96 % - -  08/26/13 1900 186/128 mmHg - - 71 15 97 % - -  08/26/13 1842 - 97.5 F (36.4 C) Oral - 18 95 % 5\' 10"  (1.778 m) 209 lb (94.802 kg)  08/26/13 1835 - - - - - 97 % - -   LACERATION REPAIR Performed by: Richarda Blade  Consent: Verbal consent obtained. Risks and benefits: risks, benefits and alternatives were discussed Patient identity confirmed: provided demographic data Time out performed prior to procedure Prepped and Draped in normal sterile fashion Wound explored Laceration Location: frontal scalp Laceration Length: 4.5 cm No Foreign Bodies seen or palpated Anesthesia: local infiltration Local anesthetic: lidocaine 2% with epinephrine Anesthetic total: 4 ml Irrigation method: syringe Amount of cleaning: standard Skin closure: 4-0 prolene Number of sutures or staples: 7 Technique: simple Patient tolerance: Patient tolerated the procedure well with no immediate complications.  11:55 PM Reevaluation with update and discussion. After initial assessment and treatment, an updated evaluation reveals he remains comfortable. Findings discussed all questions answered.. [place] Review Labs Reviewed - No data to display  Imaging Review Ct Head Wo Contrast  08/26/2013   CLINICAL DATA:  FALL  EXAM: CT HEAD WITHOUT CONTRAST  TECHNIQUE: Contiguous axial images were obtained from the base of the skull through the vertex without intravenous contrast.  COMPARISON:  Head CT dated 04/02/2013.  FINDINGS: Scalp hematoma right frontal region. Diffuse age-appropriate global atrophy. Diffuse areas of low attenuation within the subcortical, deep, and periventricular white matter regions  consistent with small vessel ischemia. No acute intracranial abnormality. Specifically, no hemorrhage, hydrocephalus, mass lesion, acute infarction, or significant intracranial injury. No acute calvarial abnormality. Stable areas of mucosal thickening and opacification in the ethmoid air cells. Paranasal sinuses otherwise patent.  IMPRESSION: Scalp hematoma right frontal region. No acute intracranial abnormality. Chronic and involutional changes.   Electronically Signed   By: Margaree Mackintosh M.D.   On: 08/26/2013 22:22     EKG Interpretation None      MDM   Final diagnoses:  Head injury  Laceration of scalp  Dizziness    Recurrent dizziness, and falling with mild head injury, today. Laceration requiring repair. Doubt serious head injury, new CNS abnormality, metabolic instability, or serious bacterial infection.  Nursing Notes Reviewed/ Care Coordinated Applicable Imaging Reviewed Interpretation of Laboratory Data incorporated into ED treatment  The patient appears reasonably screened and/or stabilized for discharge and I doubt any other medical condition or other [facility] requiring further screening, evaluation, or treatment in the ED at this time prior to discharge.  Plan: Home Medications- usual; Home Treatments- rest; return here if the recommended treatment, does not improve the symptoms; Recommended follow up- sutures out 5-6 days     Richarda Blade, MD 08/27/13 [phone]

## 2013-08-26 NOTE — ED Notes (Signed)
Dr Eulis Foster at bedside for suturing.

## 2013-09-08 ENCOUNTER — Other Ambulatory Visit: Payer: Self-pay | Admitting: Physician Assistant

## 2013-09-08 NOTE — H&P (Signed)
TOTAL KNEE ADMISSION H&P  Patient is being admitted for right total knee arthroplasty.  Subjective:  Chief Complaint:right knee pain.  HPI: George Vega, 73 y.o. male, has a history of pain and functional disability in the right knee due to arthritis and has failed non-surgical conservative treatments for greater than 12 weeks to includeNSAID's and/or analgesics, corticosteriod injections and activity modification.  Onset of symptoms was gradual, starting >10 years ago with gradually worsening course since that time. The patient noted prior procedures on the knee to include  arthroscopy and menisectomy on the right knee(s).  Patient currently rates pain in the right knee(s) at 5 out of 10 with activity. Patient has night pain, worsening of pain with activity and weight bearing, pain that interferes with activities of daily living, pain with passive range of motion and joint swelling.  Patient has evidence of subchondral cysts, subchondral sclerosis and joint space narrowing by imaging studies. There is no active infection.  Patient Active Problem List   Diagnosis Date Noted  . Facial droop 04/02/2013  . Ataxia 04/02/2013  . Leukocytosis 04/02/2013  . CAD (coronary artery disease) 04/02/2013  . Hyperlipemia 04/02/2013  . HTN (hypertension) 04/02/2013  . Rash 04/02/2013  . CAD 11/04/2008  . DEPRESSION 11/03/2008  . DYSTHYMIC DISORDER 10/13/2008  . ECHOCARDIOGRAM, ABNORMAL 10/13/2008  . STRESS ELECTROCARDIOGRAM, ABNORMAL 10/13/2008   Past Medical History  Diagnosis Date  . Arthritis   . Spastic colon   . Chronic sinusitis   . Coronary artery disease   . Hypertension   . Depression   . Difficult intubation     was told with shoulder done 2006-alittle narrow    Past Surgical History  Procedure Laterality Date  . Uvulopalatopharyngoplasty    . 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 Nasal sinus surgery    . Shoulder arthroscopy  03/01/2011    Procedure: ARTHROSCOPY  SHOULDER;  Surgeon: Ninetta Lights, MD;  Location: [place];  Service: Orthopedics;  Laterality: Right;  arthroscopy shoulder decompression subacromial partial acromioplasty with coracoacromial release, distal claviculectomy, debridement of labrium  . Foot neuroma surgery  2002  . Shoulder arthroscopy  03/01/2011    Procedure: ARTHROSCOPY SHOULDER;  Surgeon: Ninetta Lights, MD;  Location: [place];  Service: Orthopedics;  Laterality: Right;  arthroscopy shoulder decompression subacromial partial acromioplasty with coracoacromial release, distal claviculectomy, debridement of labrium  . Cardiac catheterization  10/2008    normal  . Cardiac catheterization  4/13    no chg  . Total shoulder arthroplasty  06/28/2011    Procedure: TOTAL SHOULDER ARTHROPLASTY;  Surgeon: Ninetta Lights, MD;  Location: [place];  Service: Orthopedics;  Laterality: Right;     (Not in a hospital admission) Allergies  Allergen Reactions  . Tetanus Toxoid Anaphylaxis and Hives    REACTION: hives/throat swells shut  . Fish Oil Other (See Comments)    cramping  . Glucosamine Other (See Comments)    cramping  . Ibuprofen Nausea Only and Other (See Comments)    Stomach pains    History  Substance Use Topics  . Smoking status: Former Smoker    Quit date: 02/27/1998  . Smokeless tobacco: Not on file  . Alcohol Use:     No family history on file.   Review of Systems  Constitutional: Negative.   HENT: Negative.   Eyes: Negative.   Respiratory: Negative.   Cardiovascular: Negative.   Gastrointestinal: Negative.   Genitourinary:  Negative.   Musculoskeletal: Positive for back pain and joint pain.  Skin: Negative.   Neurological: Negative.   Endo/Heme/Allergies: Bruises/bleeds easily.  Psychiatric/Behavioral: Negative.     Objective:  Physical Exam  Constitutional: He is oriented to person, place, and time. He appears well-developed and well-nourished.   HENT:  Head: Normocephalic and atraumatic.  Eyes: EOM are normal. Pupils are equal, round, and reactive to light.  Neck: Normal range of motion.  Cardiovascular: Normal rate and regular rhythm.  Exam reveals no gallop and no friction rub.   No murmur heard. Respiratory: Effort normal and breath sounds normal. No respiratory distress. He has no wheezes. He has no rales.  GI: Soft. Bowel sounds are normal. He exhibits no distension.  Musculoskeletal:  Range of motion 0-130 degrees.  Varus thrust on the right.  Tenderness to palpation medial joint line.  No patellofemoral crepitus noted.  Neurovascularly intact distally.  Neurological: He is alert and oriented to person, place, and time.  Skin: Skin is warm and dry.  Psychiatric: He has a normal mood and affect. His behavior is normal. Judgment and thought content normal.    Vital signs in last 24 hours: @VSRANGES @  Labs:   Estimated body mass index is 29.99 kg/(m^2) as calculated from the following:   Height as of 08/26/13: 5\' 10"  (1.778 m).   Weight as of 08/26/13: 94.802 kg (209 lb).   Imaging Review Plain radiographs demonstrate severe degenerative joint disease of the right knee(s). The overall alignment ismild varus. The bone quality appears to be fair for age and reported activity level.  Assessment/Plan:  End stage arthritis, right knee   The patient history, physical examination, clinical judgment of the provider and imaging studies are consistent with end stage degenerative joint disease of the right knee(s) and total knee arthroplasty is deemed medically necessary. The treatment options including medical management, injection therapy arthroscopy and arthroplasty were discussed at length. The risks and benefits of total knee arthroplasty were presented and reviewed. The risks due to aseptic loosening, infection, stiffness, patella tracking problems, thromboembolic complications and other imponderables were discussed. The  patient acknowledged the explanation, agreed to proceed with the plan and consent was signed. Patient is being admitted for inpatient treatment for surgery, pain control, PT, OT, prophylactic antibiotics, VTE prophylaxis, progressive ambulation and ADL's and discharge planning. The patient is planning to be discharged home with home health services

## 2013-09-10 ENCOUNTER — Encounter (HOSPITAL_COMMUNITY): Payer: Self-pay

## 2013-09-14 NOTE — Pre-Procedure Instructions (Signed)
George Vega  09/14/2013   Your procedure is scheduled on:  09/23/13  Report to [facility] Admitting at 945 AM.  Call this number if you have problems the morning of surgery: [phone]   Remember:   Do not eat food or drink liquids after midnight.   Take these medicines the morning of surgery with A SIP OF WATER: carvedilol,paxil   Do not wear jewelry, make-up or nail polish.  Do not wear lotions, powders, or perfumes. You may wear deodorant.  Do not shave 48 hours prior to surgery. Men may shave face and neck.  Do not bring valuables to the hospital.  [facility] is not responsible                  for any belongings or valuables.               Contacts, dentures or bridgework may not be worn into surgery.  Leave suitcase in the car. After surgery it may be brought to your room.  For patients admitted to the hospital, discharge time is determined by your                treatment team.               Patients discharged the day of surgery will not be allowed to drive  home.  Name and phone number of your driver: family  Special Instructions: Shower using CHG 2 nights before surgery and the night before surgery.  If you shower the day of surgery use CHG.  Use special wash - you have one bottle of CHG for all showers.  You should use approximately 1/3 of the bottle for each shower.   Please read over the following fact sheets that you were given: Pain Booklet, Coughing and Deep Breathing, Blood Transfusion Information, MRSA Information and Surgical Site Infection Prevention

## 2013-09-15 ENCOUNTER — Encounter (HOSPITAL_COMMUNITY)
Admission: RE | Admit: 2013-09-15 | Discharge: 2013-09-15 | Disposition: A | Discharge status: Home / Self Care | Payer: Medicare Other | Source: Ambulatory Visit | Attending: Orthopedic Surgery | Admitting: Orthopedic Surgery

## 2013-09-15 ENCOUNTER — Encounter (HOSPITAL_COMMUNITY): Payer: Self-pay

## 2013-09-15 DIAGNOSIS — Z01812 Encounter for preprocedural laboratory examination: Secondary | ICD-10-CM | POA: Insufficient documentation

## 2013-09-15 HISTORY — DX: Displacement of other gastrointestinal prosthetic devices, implants and grafts, initial encounter: T85.528A

## 2013-09-15 LAB — CBC WITH DIFFERENTIAL/PLATELET
Basophils Absolute: 0.1 10*3/uL (ref 0.0–0.1)
Basophils Relative: 1 % (ref 0–1)
Eosinophils Absolute: 0.5 10*3/uL (ref 0.0–0.7)
Eosinophils Relative: 6 % — ABNORMAL HIGH (ref 0–5)
HCT: 44.4 % (ref 39.0–52.0)
HEMOGLOBIN: 15.4 g/dL (ref 13.0–17.0)
LYMPHS PCT: 21 % (ref 12–46)
Lymphs Abs: 1.8 10*3/uL (ref 0.7–4.0)
MCH: 32.4 pg (ref 26.0–34.0)
MCHC: 34.7 g/dL (ref 30.0–36.0)
MCV: 93.3 fL (ref 78.0–100.0)
Monocytes Absolute: 0.7 10*3/uL (ref 0.1–1.0)
Monocytes Relative: 8 % (ref 3–12)
NEUTROS ABS: 5.5 10*3/uL (ref 1.7–7.7)
NEUTROS PCT: 64 % (ref 43–77)
Platelets: 240 10*3/uL (ref 150–400)
RBC: 4.76 MIL/uL (ref 4.22–5.81)
RDW: 13.1 % (ref 11.5–15.5)
WBC: 8.6 10*3/uL (ref 4.0–10.5)

## 2013-09-15 LAB — URINALYSIS, ROUTINE W REFLEX MICROSCOPIC
BILIRUBIN URINE: NEGATIVE
Glucose, UA: NEGATIVE mg/dL
Hgb urine dipstick: NEGATIVE
Ketones, ur: NEGATIVE mg/dL
NITRITE: NEGATIVE
Protein, ur: NEGATIVE mg/dL
SPECIFIC GRAVITY, URINE: 1.017 (ref 1.005–1.030)
Urobilinogen, UA: 1 mg/dL (ref 0.0–1.0)
pH: 5 (ref 5.0–8.0)

## 2013-09-15 LAB — COMPREHENSIVE METABOLIC PANEL
ALBUMIN: 3.7 g/dL (ref 3.5–5.2)
ALK PHOS: 93 U/L (ref 39–117)
ALT: 74 U/L — AB (ref 0–53)
AST: 53 U/L — ABNORMAL HIGH (ref 0–37)
Anion gap: 16 — ABNORMAL HIGH (ref 5–15)
BUN: 14 mg/dL (ref 6–23)
CO2: 23 meq/L (ref 19–32)
Calcium: 9.6 mg/dL (ref 8.4–10.5)
Chloride: 98 mEq/L (ref 96–112)
Creatinine, Ser: 0.9 mg/dL (ref 0.50–1.35)
GFR calc Af Amer: >90 mL/min (ref >90)
GFR calc non Af Amer: 83 mL/min — ABNORMAL LOW (ref >90)
Glucose, Bld: 106 mg/dL — ABNORMAL HIGH (ref 70–99)
POTASSIUM: 4.6 meq/L (ref 3.7–5.3)
Sodium: 137 mEq/L (ref 137–147)
Total Bilirubin: 0.9 mg/dL (ref 0.3–1.2)
Total Protein: 7 g/dL (ref 6.0–8.3)

## 2013-09-15 LAB — URINE MICROSCOPIC-ADD ON

## 2013-09-15 LAB — SURGICAL PCR SCREEN
MRSA, PCR: NEGATIVE
Staphylococcus aureus: NEGATIVE

## 2013-09-15 LAB — TYPE AND SCREEN
ABO/RH(D): O NEG
ANTIBODY SCREEN: NEGATIVE

## 2013-09-15 LAB — PROTIME-INR
INR: 0.96 (ref 0.00–1.49)
Prothrombin Time: 12.8 seconds (ref 11.6–15.2)

## 2013-09-15 LAB — ABO/RH: ABO/RH(D): O NEG

## 2013-09-15 LAB — APTT: APTT: 31 s (ref 24–37)

## 2013-09-16 ENCOUNTER — Encounter (HOSPITAL_COMMUNITY): Payer: Self-pay

## 2013-09-16 LAB — URINE CULTURE

## 2013-09-16 NOTE — Progress Notes (Addendum)
Anesthesia chart review: Patient is a 73 year old male scheduled for right TKR on 09/23/13 by Dr. Kathryne Hitch.  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 Estanislado Pandy), depression, arthritis, nasal sinus surgery and UPPP, right total shoulder '13. Daily ETOH use. He was evaluated in the ED on 08/26/13 after falling and hitting his head on a cabinet without LOC. CT showed scalp right frontal hematoma, no acute intracranial abnormality. Cardiologist is Dr. Einar Gip, records are still pending.  Cardiac cath on 06/14/11 showed: EF 60%, normal LV wall motion, 20% LM. Chronic total occlusion of mid LAD.  Type II collaterals for RCA, no change from 2010. Large D1, small D2. 50% ostial LCX, no change from 2010. NL OM1. Large RI with mid 20%. RCA gives collaterals to occluded mid LAD, mid 10% stenosis. NL RV branch, PDA, PLA. Anatomy felt unchanged since 2010.  EKG on 04/02/13 showed: NSR, low voltage in precordial leads.   Bilateral CCA and VA angiogram on 11/18/12 showed: 1. Minimal stenoses of the dominant right vertebral artery at its origin. 2. Retrograde opacification of the distal left vertebrobasilar junction from the right vertebral artery injection. 3. 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 4. 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  Dr. Estanislado Pandy reviewed the findings with the patient and the  patient's family. In view of the above neuroimaging findings and the patient's clinical stability, he has been instructed to continue taking his aspirin and Plavix as he has been in addition to his  antihypertensives and statins. A follow-up  cerebral catheter angiogram or an MRI/MRA of the brain will be undertaken in about a year's time.  CXR on 04/03/13 showed: There is no evidence of pneumonia nor pleural effusion nor other acute cardiopulmonary abnormality. There is mild hyperinflation and mild stable prominence of the interstitial markings bilaterally consistent with COPD.  Preoperative labs noted. AST/ALT are elevated, but < 2X above normal. Urine culture showed insignificant growth.  I'll follow-up once I have additional cardiology records.  George Hugh [facility] Short Stay Center/Anesthesiology Phone [phone] 09/16/2013 3:52 PM  Addendum: 09/21/2013 11:52 AM On 09/18/13 I did receive a signed clearance note from Dr. Einar Gip with permission to hold Plavix five days prior to surgery. Additional records received today stating: Echo on 06/05/11 showed: LV internal dimension is decreased. Moderate concentric LVH. Diastolic filling with impaired relaxation pattern and normal to low pressure. Normal global wall motion. Normal systolic global function. Calculated EF 59%. Doppler evidence of grade I/IV diastolic dysfunction. Trace MR, TR. EKG on 09/01/13 showed: SR, low voltage QRS, cannot exclude inferior infarct (age undetermined), occasional PAC, PVC. A 09/21/13 signed note of clearance from Dr. Irven Shelling partner, Dr. Woody Seller, was also faxed with records received.

## 2013-09-22 MED ORDER — CEFAZOLIN SODIUM-DEXTROSE 2-3 GM-% IV SOLR
2.0000 g | INTRAVENOUS | Status: AC
  Administered 2013-09-23: 2 g via INTRAVENOUS
  Filled 2013-09-22: qty 50

## 2013-09-22 MED ORDER — CHLORHEXIDINE GLUCONATE 4 % EX LIQD
60.0000 mL | Freq: Once | CUTANEOUS | Status: DC
  Filled 2013-09-22: qty 60

## 2013-09-22 MED ORDER — LACTATED RINGERS IV SOLN
INTRAVENOUS | Status: DC
  Administered 2013-09-23: 10:00:00 via INTRAVENOUS

## 2013-09-23 ENCOUNTER — Encounter (HOSPITAL_COMMUNITY): Payer: Self-pay | Admitting: *Deleted

## 2013-09-23 ENCOUNTER — Encounter (HOSPITAL_COMMUNITY)
Admission: RE | Disposition: A | Discharge status: Skilled Nursing Facility | Payer: Self-pay | Source: Ambulatory Visit | Attending: Orthopedic Surgery

## 2013-09-23 ENCOUNTER — Inpatient Hospital Stay (HOSPITAL_COMMUNITY): Payer: Medicare Other

## 2013-09-23 ENCOUNTER — Encounter (HOSPITAL_COMMUNITY): Payer: Medicare Other | Admitting: Vascular Surgery

## 2013-09-23 ENCOUNTER — Inpatient Hospital Stay (HOSPITAL_COMMUNITY)
Admission: RE | Admit: 2013-09-23 | Discharge: 2013-09-27 | DRG: 470 | Disposition: A | Discharge status: Skilled Nursing Facility | Payer: Medicare Other | Source: Ambulatory Visit | Attending: Orthopedic Surgery | Admitting: Orthopedic Surgery

## 2013-09-23 ENCOUNTER — Inpatient Hospital Stay (HOSPITAL_COMMUNITY): Payer: Medicare Other | Admitting: Anesthesiology

## 2013-09-23 DIAGNOSIS — M1710 Unilateral primary osteoarthritis, unspecified knee: Secondary | ICD-10-CM | POA: Diagnosis present

## 2013-09-23 DIAGNOSIS — D62 Acute posthemorrhagic anemia: Secondary | ICD-10-CM | POA: Diagnosis not present

## 2013-09-23 DIAGNOSIS — M1711 Unilateral primary osteoarthritis, right knee: Secondary | ICD-10-CM

## 2013-09-23 DIAGNOSIS — F10231 Alcohol dependence with withdrawal delirium: Secondary | ICD-10-CM | POA: Diagnosis present

## 2013-09-23 DIAGNOSIS — F1020 Alcohol dependence, uncomplicated: Secondary | ICD-10-CM | POA: Diagnosis present

## 2013-09-23 DIAGNOSIS — Z887 Allergy status to serum and vaccine status: Secondary | ICD-10-CM | POA: Diagnosis not present

## 2013-09-23 DIAGNOSIS — Z7902 Long term (current) use of antithrombotics/antiplatelets: Secondary | ICD-10-CM | POA: Diagnosis not present

## 2013-09-23 DIAGNOSIS — Z789 Other specified health status: Secondary | ICD-10-CM

## 2013-09-23 DIAGNOSIS — Z96619 Presence of unspecified artificial shoulder joint: Secondary | ICD-10-CM

## 2013-09-23 DIAGNOSIS — Z7982 Long term (current) use of aspirin: Secondary | ICD-10-CM | POA: Diagnosis not present

## 2013-09-23 DIAGNOSIS — F10931 Alcohol use, unspecified with withdrawal delirium: Secondary | ICD-10-CM | POA: Diagnosis present

## 2013-09-23 DIAGNOSIS — I10 Essential (primary) hypertension: Secondary | ICD-10-CM | POA: Diagnosis present

## 2013-09-23 DIAGNOSIS — M179 Osteoarthritis of knee, unspecified: Secondary | ICD-10-CM | POA: Diagnosis present

## 2013-09-23 DIAGNOSIS — Z87891 Personal history of nicotine dependence: Secondary | ICD-10-CM

## 2013-09-23 DIAGNOSIS — F3289 Other specified depressive episodes: Secondary | ICD-10-CM | POA: Diagnosis present

## 2013-09-23 DIAGNOSIS — D72829 Elevated white blood cell count, unspecified: Secondary | ICD-10-CM | POA: Diagnosis present

## 2013-09-23 DIAGNOSIS — Z8673 Personal history of transient ischemic attack (TIA), and cerebral infarction without residual deficits: Secondary | ICD-10-CM

## 2013-09-23 DIAGNOSIS — F329 Major depressive disorder, single episode, unspecified: Secondary | ICD-10-CM | POA: Diagnosis present

## 2013-09-23 DIAGNOSIS — I2510 Atherosclerotic heart disease of native coronary artery without angina pectoris: Secondary | ICD-10-CM | POA: Diagnosis present

## 2013-09-23 DIAGNOSIS — E785 Hyperlipidemia, unspecified: Secondary | ICD-10-CM | POA: Diagnosis present

## 2013-09-23 DIAGNOSIS — K589 Irritable bowel syndrome without diarrhea: Secondary | ICD-10-CM | POA: Diagnosis present

## 2013-09-23 DIAGNOSIS — Z7289 Other problems related to lifestyle: Secondary | ICD-10-CM

## 2013-09-23 DIAGNOSIS — IMO0002 Reserved for concepts with insufficient information to code with codable children: Secondary | ICD-10-CM | POA: Diagnosis present

## 2013-09-23 HISTORY — PX: TOTAL KNEE ARTHROPLASTY: SHX125

## 2013-09-23 SURGERY — ARTHROPLASTY, KNEE, TOTAL
Anesthesia: Spinal | Site: Knee | Laterality: Right

## 2013-09-23 MED ORDER — PAROXETINE HCL 20 MG PO TABS
20.0000 mg | ORAL_TABLET | Freq: Every evening | ORAL | Status: DC
  Administered 2013-09-23 – 2013-09-27 (×5): 20 mg via ORAL
  Filled 2013-09-23 (×5): qty 1

## 2013-09-23 MED ORDER — BUPIVACAINE LIPOSOME 1.3 % IJ SUSP
20.0000 mL | INTRAMUSCULAR | Status: DC
  Filled 2013-09-23: qty 20

## 2013-09-23 MED ORDER — OXYCODONE-ACETAMINOPHEN 5-325 MG PO TABS: 1.0000 | ORAL_TABLET | ORAL | Status: DC | PRN

## 2013-09-23 MED ORDER — CEFAZOLIN SODIUM-DEXTROSE 2-3 GM-% IV SOLR
2.0000 g | Freq: Four times a day (QID) | INTRAVENOUS | Status: AC
  Administered 2013-09-23 – 2013-09-24 (×2): 2 g via INTRAVENOUS
  Filled 2013-09-23 (×2): qty 50

## 2013-09-23 MED ORDER — BISACODYL 5 MG PO TBEC: 5.0000 mg | DELAYED_RELEASE_TABLET | Freq: Every day | ORAL | Status: DC | PRN

## 2013-09-23 MED ORDER — ONDANSETRON HCL 4 MG/2ML IJ SOLN
INTRAMUSCULAR | Status: AC
  Filled 2013-09-23: qty 2

## 2013-09-23 MED ORDER — ONDANSETRON HCL 4 MG PO TABS: 4.0000 mg | ORAL_TABLET | Freq: Three times a day (TID) | ORAL | Status: DC | PRN

## 2013-09-23 MED ORDER — ACETAMINOPHEN 650 MG RE SUPP: 650.0000 mg | Freq: Four times a day (QID) | RECTAL | Status: DC | PRN

## 2013-09-23 MED ORDER — ONDANSETRON HCL 4 MG/2ML IJ SOLN
INTRAMUSCULAR | Status: DC | PRN
  Administered 2013-09-23: 4 mg via INTRAVENOUS

## 2013-09-23 MED ORDER — ONDANSETRON HCL 4 MG/2ML IJ SOLN: 4.0000 mg | Freq: Once | INTRAMUSCULAR | Status: DC | PRN

## 2013-09-23 MED ORDER — PROPOFOL 10 MG/ML IV BOLUS
INTRAVENOUS | Status: AC
  Filled 2013-09-23: qty 20

## 2013-09-23 MED ORDER — ASPIRIN EC 325 MG PO TBEC: 325.0000 mg | DELAYED_RELEASE_TABLET | Freq: Every day | ORAL | Status: DC

## 2013-09-23 MED ORDER — ALUM & MAG HYDROXIDE-SIMETH 200-200-20 MG/5ML PO SUSP
30.0000 mL | ORAL | Status: DC | PRN
  Administered 2013-09-26: 30 mL via ORAL
  Filled 2013-09-23: qty 30

## 2013-09-23 MED ORDER — OXYCODONE HCL 5 MG PO TABS: 5.0000 mg | ORAL_TABLET | Freq: Once | ORAL | Status: DC | PRN

## 2013-09-23 MED ORDER — METHOCARBAMOL 500 MG PO TABS
500.0000 mg | ORAL_TABLET | Freq: Four times a day (QID) | ORAL | Status: DC | PRN
  Administered 2013-09-23 – 2013-09-25 (×6): 500 mg via ORAL
  Filled 2013-09-23 (×7): qty 1

## 2013-09-23 MED ORDER — METOCLOPRAMIDE HCL 5 MG/ML IJ SOLN
5.0000 mg | Freq: Three times a day (TID) | INTRAMUSCULAR | Status: DC | PRN
Start: 2013-09-23 — End: 2013-09-26

## 2013-09-23 MED ORDER — MENTHOL 3 MG MT LOZG: 1.0000 | LOZENGE | OROMUCOSAL | Status: DC | PRN

## 2013-09-23 MED ORDER — METHOCARBAMOL 1000 MG/10ML IJ SOLN
500.0000 mg | Freq: Four times a day (QID) | INTRAVENOUS | Status: DC | PRN
  Filled 2013-09-23: qty 5

## 2013-09-23 MED ORDER — BUPIVACAINE HCL (PF) 0.25 % IJ SOLN
INTRAMUSCULAR | Status: AC
  Filled 2013-09-23: qty 30

## 2013-09-23 MED ORDER — FENTANYL CITRATE 0.05 MG/ML IJ SOLN
INTRAMUSCULAR | Status: DC | PRN
  Administered 2013-09-23 (×2): 50 ug via INTRAVENOUS

## 2013-09-23 MED ORDER — ONDANSETRON HCL 4 MG/2ML IJ SOLN: 4.0000 mg | Freq: Four times a day (QID) | INTRAMUSCULAR | Status: DC | PRN

## 2013-09-23 MED ORDER — BUPIVACAINE HCL (PF) 0.25 % IJ SOLN
INTRAMUSCULAR | Status: DC | PRN
  Administered 2013-09-23: 10 mL

## 2013-09-23 MED ORDER — DIPHENHYDRAMINE HCL 12.5 MG/5ML PO ELIX: 12.5000 mg | ORAL_SOLUTION | ORAL | Status: DC | PRN

## 2013-09-23 MED ORDER — PHENYLEPHRINE HCL 10 MG/ML IJ SOLN
INTRAMUSCULAR | Status: DC | PRN
  Administered 2013-09-23 (×2): 80 ug via INTRAVENOUS

## 2013-09-23 MED ORDER — CARVEDILOL 6.25 MG PO TABS
6.2500 mg | ORAL_TABLET | Freq: Two times a day (BID) | ORAL | Status: DC
  Administered 2013-09-23 – 2013-09-27 (×9): 6.25 mg via ORAL
  Filled 2013-09-23 (×10): qty 1

## 2013-09-23 MED ORDER — ATORVASTATIN CALCIUM 10 MG PO TABS
10.0000 mg | ORAL_TABLET | Freq: Every day | ORAL | Status: DC
  Administered 2013-09-23 – 2013-09-27 (×5): 10 mg via ORAL
  Filled 2013-09-23 (×5): qty 1

## 2013-09-23 MED ORDER — ACETAMINOPHEN 325 MG PO TABS
650.0000 mg | ORAL_TABLET | Freq: Four times a day (QID) | ORAL | Status: DC | PRN
  Administered 2013-09-26 – 2013-09-27 (×4): 650 mg via ORAL
  Filled 2013-09-23 (×4): qty 2

## 2013-09-23 MED ORDER — METHOCARBAMOL 500 MG PO TABS: 500.0000 mg | ORAL_TABLET | Freq: Four times a day (QID) | ORAL | Status: DC

## 2013-09-23 MED ORDER — ASPIRIN EC 325 MG PO TBEC
325.0000 mg | DELAYED_RELEASE_TABLET | Freq: Every day | ORAL | Status: DC
  Administered 2013-09-24 – 2013-09-27 (×4): 325 mg via ORAL
  Filled 2013-09-23 (×5): qty 1

## 2013-09-23 MED ORDER — BISACODYL 5 MG PO TBEC
5.0000 mg | DELAYED_RELEASE_TABLET | Freq: Every day | ORAL | Status: DC | PRN
  Administered 2013-09-26: 5 mg via ORAL
  Filled 2013-09-23: qty 1

## 2013-09-23 MED ORDER — CALCIUM POLYCARBOPHIL 625 MG PO TABS
625.0000 mg | ORAL_TABLET | Freq: Every day | ORAL | Status: DC
  Administered 2013-09-23 – 2013-09-27 (×4): 625 mg via ORAL
  Filled 2013-09-23 (×5): qty 1

## 2013-09-23 MED ORDER — DEXAMETHASONE 6 MG PO TABS
10.0000 mg | ORAL_TABLET | Freq: Three times a day (TID) | ORAL | Status: AC
  Administered 2013-09-23 – 2013-09-24 (×3): 10 mg via ORAL
  Filled 2013-09-23 (×3): qty 1

## 2013-09-23 MED ORDER — PROPOFOL INFUSION 10 MG/ML OPTIME
INTRAVENOUS | Status: DC | PRN
  Administered 2013-09-23: 75 ug/kg/min via INTRAVENOUS

## 2013-09-23 MED ORDER — SODIUM CHLORIDE 0.9 % IR SOLN
Status: DC | PRN
  Administered 2013-09-23 (×2): 1000 mL

## 2013-09-23 MED ORDER — LISINOPRIL 10 MG PO TABS
10.0000 mg | ORAL_TABLET | Freq: Every day | ORAL | Status: DC
  Administered 2013-09-24 – 2013-09-25 (×2): 10 mg via ORAL
  Filled 2013-09-23 (×2): qty 1

## 2013-09-23 MED ORDER — LIDOCAINE HCL (CARDIAC) 20 MG/ML IV SOLN
INTRAVENOUS | Status: AC
Start: 2013-09-23 — End: 2013-09-23
  Filled 2013-09-23: qty 5

## 2013-09-23 MED ORDER — MIDAZOLAM HCL 2 MG/2ML IJ SOLN
INTRAMUSCULAR | Status: AC
  Filled 2013-09-23: qty 2

## 2013-09-23 MED ORDER — PHENOL 1.4 % MT LIQD
1.0000 | OROMUCOSAL | Status: DC | PRN
  Filled 2013-09-23: qty 177

## 2013-09-23 MED ORDER — FENTANYL CITRATE 0.05 MG/ML IJ SOLN
INTRAMUSCULAR | Status: AC
  Filled 2013-09-23: qty 5

## 2013-09-23 MED ORDER — DOCUSATE SODIUM 100 MG PO CAPS
100.0000 mg | ORAL_CAPSULE | Freq: Two times a day (BID) | ORAL | Status: DC
Start: 2013-09-23 — End: 2013-09-27
  Administered 2013-09-23 – 2013-09-27 (×9): 100 mg via ORAL
  Filled 2013-09-23 (×8): qty 1

## 2013-09-23 MED ORDER — METOCLOPRAMIDE HCL 10 MG PO TABS: 5.0000 mg | ORAL_TABLET | Freq: Three times a day (TID) | ORAL | Status: DC | PRN

## 2013-09-23 MED ORDER — SODIUM CHLORIDE 0.9 % IJ SOLN
INTRAMUSCULAR | Status: AC
  Filled 2013-09-23: qty 9

## 2013-09-23 MED ORDER — HYDROMORPHONE HCL PF 1 MG/ML IJ SOLN
0.5000 mg | INTRAMUSCULAR | Status: DC | PRN
  Administered 2013-09-23 – 2013-09-24 (×3): 1 mg via INTRAVENOUS
  Filled 2013-09-23 (×3): qty 1

## 2013-09-23 MED ORDER — OXYCODONE HCL 5 MG PO TABS
5.0000 mg | ORAL_TABLET | ORAL | Status: DC | PRN
  Administered 2013-09-23 – 2013-09-25 (×13): 10 mg via ORAL
  Administered 2013-09-26: 5 mg via ORAL
  Administered 2013-09-26: 10 mg via ORAL
  Filled 2013-09-23 (×4): qty 2
  Filled 2013-09-23: qty 1
  Filled 2013-09-23 (×7): qty 2
  Filled 2013-09-23: qty 1
  Filled 2013-09-23 (×4): qty 2

## 2013-09-23 MED ORDER — 0.9 % SODIUM CHLORIDE (POUR BTL) OPTIME
TOPICAL | Status: DC | PRN
  Administered 2013-09-23: 1000 mL

## 2013-09-23 MED ORDER — LIDOCAINE HCL (CARDIAC) 20 MG/ML IV SOLN
INTRAVENOUS | Status: DC | PRN
  Administered 2013-09-23: 20 mg via INTRAVENOUS

## 2013-09-23 MED ORDER — ONDANSETRON HCL 4 MG PO TABS: 4.0000 mg | ORAL_TABLET | Freq: Four times a day (QID) | ORAL | Status: DC | PRN

## 2013-09-23 MED ORDER — LACTATED RINGERS IV SOLN
INTRAVENOUS | Status: DC | PRN
  Administered 2013-09-23 (×2): via INTRAVENOUS

## 2013-09-23 MED ORDER — SODIUM CHLORIDE 0.9 % IJ SOLN
INTRAMUSCULAR | Status: DC | PRN
  Administered 2013-09-23: 40 mL

## 2013-09-23 MED ORDER — BUPIVACAINE LIPOSOME 1.3 % IJ SUSP
INTRAMUSCULAR | Status: DC | PRN
  Administered 2013-09-23: 20 mL

## 2013-09-23 MED ORDER — DEXAMETHASONE SODIUM PHOSPHATE 10 MG/ML IJ SOLN
10.0000 mg | Freq: Three times a day (TID) | INTRAMUSCULAR | Status: AC
  Filled 2013-09-23 (×3): qty 1

## 2013-09-23 MED ORDER — HYDROMORPHONE HCL PF 1 MG/ML IJ SOLN
INTRAMUSCULAR | Status: AC
  Filled 2013-09-23: qty 1

## 2013-09-23 MED ORDER — OXYCODONE HCL 5 MG/5ML PO SOLN: 5.0000 mg | Freq: Once | ORAL | Status: DC | PRN

## 2013-09-23 MED ORDER — CLOPIDOGREL BISULFATE 75 MG PO TABS
75.0000 mg | ORAL_TABLET | Freq: Every day | ORAL | Status: DC
  Administered 2013-09-23 – 2013-09-26 (×4): 75 mg via ORAL
  Filled 2013-09-23 (×5): qty 1

## 2013-09-23 MED ORDER — PROPOFOL 10 MG/ML IV BOLUS
INTRAVENOUS | Status: DC | PRN
  Administered 2013-09-23 (×2): 20 mg via INTRAVENOUS

## 2013-09-23 MED ORDER — POTASSIUM CHLORIDE IN NACL 20-0.9 MEQ/L-% IV SOLN
INTRAVENOUS | Status: DC
  Administered 2013-09-23: 100 mL/h via INTRAVENOUS
  Administered 2013-09-24: 04:00:00 via INTRAVENOUS
  Filled 2013-09-23 (×3): qty 1000

## 2013-09-23 MED ORDER — HYDROMORPHONE HCL PF 1 MG/ML IJ SOLN
0.2500 mg | INTRAMUSCULAR | Status: DC | PRN
  Administered 2013-09-23 (×2): 0.5 mg via INTRAVENOUS

## 2013-09-23 SURGICAL SUPPLY — 70 items
APL SKNCLS STERI-STRIP NONHPOA (GAUZE/BANDAGES/DRESSINGS) ×1
BANDAGE ELASTIC 4 VELCRO ST LF (GAUZE/BANDAGES/DRESSINGS) ×3 IMPLANT
BANDAGE ELASTIC 6 VELCRO ST LF (GAUZE/BANDAGES/DRESSINGS) ×3 IMPLANT
BANDAGE ESMARK 6X9 LF (GAUZE/BANDAGES/DRESSINGS) ×1 IMPLANT
BENZOIN TINCTURE PRP APPL 2/3 (GAUZE/BANDAGES/DRESSINGS) ×3 IMPLANT
BLADE SAG 18X100X1.27 (BLADE) ×6 IMPLANT
BLADE SURG 10 STRL SS (BLADE) ×2 IMPLANT
BNDG CMPR 9X6 STRL LF SNTH (GAUZE/BANDAGES/DRESSINGS) ×1
BNDG ESMARK 6X9 LF (GAUZE/BANDAGES/DRESSINGS) ×3
BOWL SMART MIX CTS (DISPOSABLE) ×3 IMPLANT
CEMENT BONE SIMPLEX SPEEDSET (Cement) ×6 IMPLANT
CLOSURE WOUND 1/2 X4 (GAUZE/BANDAGES/DRESSINGS) ×2
COVER SURGICAL LIGHT HANDLE (MISCELLANEOUS) ×3 IMPLANT
CUFF TOURNIQUET SINGLE 34IN LL (TOURNIQUET CUFF) ×3 IMPLANT
DRAPE EXTREMITY T 121X128X90 (DRAPE) ×3 IMPLANT
DRAPE PROXIMA HALF (DRAPES) ×3 IMPLANT
DRAPE U-SHAPE 47X51 STRL (DRAPES) ×3 IMPLANT
DRSG PAD ABDOMINAL 8X10 ST (GAUZE/BANDAGES/DRESSINGS) ×3 IMPLANT
DURAPREP 26ML APPLICATOR (WOUND CARE) ×6 IMPLANT
ELECT CAUTERY BLADE 6.4 (BLADE) ×3 IMPLANT
ELECT REM PT RETURN 9FT ADLT (ELECTROSURGICAL) ×3
ELECTRODE REM PT RTRN 9FT ADLT (ELECTROSURGICAL) ×1 IMPLANT
EVACUATOR 1/8 PVC DRAIN (DRAIN) ×3 IMPLANT
FACESHIELD WRAPAROUND (MASK) ×6 IMPLANT
FACESHIELD WRAPAROUND OR TEAM (MASK) ×2 IMPLANT
GLOVE BIOGEL PI IND STRL 7.0 (GLOVE) ×2 IMPLANT
GLOVE BIOGEL PI INDICATOR 7.0 (GLOVE) ×4
GLOVE ECLIPSE 6.5 STRL STRAW (GLOVE) ×6 IMPLANT
GLOVE ORTHO TXT STRL SZ7.5 (GLOVE) ×3 IMPLANT
GLOVE SURG SS PI 8.0 STRL IVOR (GLOVE) ×2 IMPLANT
GOWN STRL REUS W/ TWL LRG LVL3 (GOWN DISPOSABLE) ×1 IMPLANT
GOWN STRL REUS W/ TWL XL LVL3 (GOWN DISPOSABLE) ×1 IMPLANT
GOWN STRL REUS W/TWL LRG LVL3 (GOWN DISPOSABLE) ×6
GOWN STRL REUS W/TWL XL LVL3 (GOWN DISPOSABLE) ×6
HANDPIECE INTERPULSE COAX TIP (DISPOSABLE) ×3
IMMOBILIZER KNEE 22 UNIV (SOFTGOODS) ×3 IMPLANT
IMMOBILIZER KNEE 24 THIGH 36 (MISCELLANEOUS) IMPLANT
IMMOBILIZER KNEE 24 UNIV (MISCELLANEOUS)
KIT BASIN OR (CUSTOM PROCEDURE TRAY) ×3 IMPLANT
KIT ROOM TURNOVER OR (KITS) ×3 IMPLANT
KNEE/VIT E POLY LINER LEVEL 1B ×2 IMPLANT
MANIFOLD NEPTUNE II (INSTRUMENTS) ×3 IMPLANT
NDL 18GX1X1/2 (RX/OR ONLY) (NEEDLE) ×1 IMPLANT
NDL 25GX 5/8IN NON SAFETY (NEEDLE) ×1 IMPLANT
NEEDLE 18GX1X1/2 (RX/OR ONLY) (NEEDLE) ×3 IMPLANT
NEEDLE 25GX 5/8IN NON SAFETY (NEEDLE) ×3 IMPLANT
NS IRRIG 1000ML POUR BTL (IV SOLUTION) ×3 IMPLANT
PACK TOTAL JOINT (CUSTOM PROCEDURE TRAY) ×3 IMPLANT
PAD ARMBOARD 7.5X6 YLW CONV (MISCELLANEOUS) ×6 IMPLANT
PAD CAST 4YDX4 CTTN HI CHSV (CAST SUPPLIES) ×1 IMPLANT
PADDING CAST COTTON 4X4 STRL (CAST SUPPLIES) ×3
PADDING CAST COTTON 6X4 STRL (CAST SUPPLIES) ×3 IMPLANT
SET HNDPC FAN SPRY TIP SCT (DISPOSABLE) ×1 IMPLANT
SPONGE GAUZE 4X4 12PLY (GAUZE/BANDAGES/DRESSINGS) ×3 IMPLANT
STRIP CLOSURE SKIN 1/2X4 (GAUZE/BANDAGES/DRESSINGS) ×4 IMPLANT
SUCTION FRAZIER TIP 10 FR DISP (SUCTIONS) ×3 IMPLANT
SUT MNCRL AB 4-0 PS2 18 (SUTURE) ×3 IMPLANT
SUT VIC AB 0 CT1 27 (SUTURE) ×6
SUT VIC AB 0 CT1 27XBRD ANBCTR (SUTURE) IMPLANT
SUT VIC AB 1 CT1 27 (SUTURE) ×9
SUT VIC AB 1 CT1 27XBRD ANBCTR (SUTURE) ×2 IMPLANT
SUT VIC AB 2-0 CT1 27 (SUTURE) ×6
SUT VIC AB 2-0 CT1 TAPERPNT 27 (SUTURE) ×2 IMPLANT
SYR 50ML LL SCALE MARK (SYRINGE) ×3 IMPLANT
SYR CONTROL 10ML LL (SYRINGE) ×3 IMPLANT
TAPE HY-TAPE 1X5Y PINK NS LF (GAUZE/BANDAGES/DRESSINGS) ×2 IMPLANT
TOWEL OR 17X24 6PK STRL BLUE (TOWEL DISPOSABLE) ×3 IMPLANT
TOWEL OR 17X26 10 PK STRL BLUE (TOWEL DISPOSABLE) ×3 IMPLANT
TRAY CATH 16FR W/PLASTIC CATH (SET/KITS/TRAYS/PACK) ×2 IMPLANT
WATER STERILE IRR 1000ML POUR (IV SOLUTION) ×4 IMPLANT

## 2013-09-23 NOTE — Progress Notes (Signed)
Utilization review completed.  

## 2013-09-23 NOTE — Evaluation (Signed)
Physical Therapy Evaluation Patient Details Name: George Vega MRN: [id number] DOB: 09-15-1940 Today's Date: 09/23/2013   History of Present Illness  s/p Rt TKA   Clinical Impression  Pt is s/p Rt TKA POD#0 resulting in the deficits listed below (see PT Problem List).  Pt will benefit from skilled PT to increase their independence and safety with mobility to allow discharge to the venue listed below. Pt highly motivated to increase independence and return home with wife. Pt on 2L of O2 secondary to difficulty breathing post-op.     Follow Up Recommendations Home health PT;Supervision/Assistance - 24 hour    Equipment Recommendations  Rolling walker with 5" wheels;3in1 (PT)    Recommendations for Other Services OT consult     Precautions / Restrictions Precautions Precautions: Fall;Knee Precaution Comments: pt given TKA HEP handout; educated on no pillow under knee Required Braces or Orthoses: Other Brace/Splint Other Brace/Splint: footsie roll Restrictions Weight Bearing Restrictions: Yes RLE Weight Bearing: Weight bearing as tolerated      Mobility  Bed Mobility Overal bed mobility: Needs Assistance Bed Mobility: Supine to Sit     Supine to sit: Supervision;HOB elevated     General bed mobility comments: cues for hand placement and sequencing; use of handrails and HOB elevated   Transfers Overall transfer level: Needs assistance Equipment used: Rolling walker (2 wheeled) Transfers: Sit to/from [facility] Sit to Stand: Min assist Stand pivot transfers: Min assist       General transfer comment: (A) to achieve upright standing position; cues for hand placement and sequencing; pt c/o buckling with Rt LE; limited to SPT; min (A) to manage RW with SPT   Ambulation/Gait             General Gait Details: pivotal steps   Stairs            Wheelchair Mobility    Modified Rankin (Stroke Patients Only)       Balance Overall  balance assessment: Needs assistance Sitting-balance support: Feet supported;No upper extremity supported Sitting balance-Leahy Scale: Good     Standing balance support: During functional activity;Bilateral upper extremity supported Standing balance-Leahy Scale: Poor Standing balance comment: bil UE support from RW                             Pertinent Vitals/Pain 4/10; RN made aware. patient repositioned for comfort     Home Living Family/patient expects to be discharged to:: Private residence Living Arrangements: Spouse/significant other Available Help at Discharge: Family;Available 24 hours/day Type of Home: House Home Access: Stairs to enter Entrance Stairs-Rails: Right Entrance Stairs-Number of Steps: 2 Home Layout: Able to live on main level with bedroom/bathroom Home Equipment: Cane - single point Additional Comments: pt has walk-in shower     Prior Function Level of Independence: Independent with assistive device(s)         Comments: pt ambulates with cane for past month due to pain      Hand Dominance        Extremity/Trunk Assessment   Upper Extremity Assessment: Defer to OT evaluation           Lower Extremity Assessment: RLE deficits/detail RLE Deficits / Details: 2+/5 in quad; able to perform SLR in bed    Cervical / Trunk Assessment: Normal  Communication   Communication: No difficulties  Cognition Arousal/Alertness: Awake/alert Behavior During Therapy: WFL for tasks assessed/performed Overall Cognitive Status: Within Functional Limits for tasks assessed  General Comments      Exercises Total Joint Exercises Ankle Circles/Pumps: AROM;Both;10 reps;Supine;Strengthening Quad Sets: AROM;Strengthening;Right;10 reps      Assessment/Plan    PT Assessment Patient needs continued PT services  PT Diagnosis Difficulty walking;Generalized weakness;Acute pain   PT Problem List Decreased  strength;Decreased range of motion;Decreased activity tolerance;Decreased balance;Decreased mobility;Decreased knowledge of use of DME;Decreased safety awareness;Decreased knowledge of precautions;Decreased skin integrity  PT Treatment Interventions DME instruction;Gait training;Stair training;Functional mobility training;Therapeutic activities;Therapeutic exercise;Balance training;Neuromuscular re-education;Patient/family education   PT Goals (Current goals can be found in the Care Plan section) Acute Rehab PT Goals Patient Stated Goal: to go home when i can PT Goal Formulation: With patient Time For Goal Achievement: 09/27/13 Potential to Achieve Goals: Good    Frequency 7X/week   Barriers to discharge        Co-evaluation               End of Session Equipment Utilized During Treatment: Gait belt;Right knee immobilizer Activity Tolerance: Patient tolerated treatment well Patient left: in chair;with call bell/phone within reach;with family/visitor present Nurse Communication: Mobility status         Time: 3818-2993 PT Time Calculation (min): 19 min   Charges:   PT Evaluation $Initial PT Evaluation Tier I: 1 Procedure PT Treatments $Therapeutic Activity: 8-22 mins   PT G CodesGustavus Bryant, [place]  [phone] 09/23/2013, 4:34 PM

## 2013-09-23 NOTE — Interval H&P Note (Signed)
History and Physical Interval Note:  09/23/2013 8:34 AM  George Vega  has presented today for surgery, with the diagnosis of OA RIGHT KNEE  The various methods of treatment have been discussed with the patient and family. After consideration of risks, benefits and other options for treatment, the patient has consented to  Procedure(s): [place] RESURFACING (Right) as a surgical intervention .  The patient's history has been reviewed, patient examined, no change in status, stable for surgery.  I have reviewed the patient's chart and labs.  Questions were answered to the patient's satisfaction.     MURPHY,DANIEL F

## 2013-09-23 NOTE — Discharge Instructions (Signed)
Total Knee Replacement Care After Refer to this sheet in the next few weeks. These instructions provide you with information on caring for yourself after your procedure. Your caregiver also may give you specific instructions. Your treatment has been planned according to the most current medical practices, but problems sometimes occur. Call your caregiver if you have any problems or questions after your procedure. HOME CARE INSTRUCTIONS   Weight bearing as tolerated.  Take Aspirin 1 tab a day for the next 30 days to prevent blood clots.  Change dressing daily starting on Saturday.  May shower on Monday, but do not soak incision.  May apply ice for up to 20 minutes at a time for pain and swelling.  Follow up appointment in our office in two weeks.    See a physical therapist as directed by your caregiver.  Take over-the-counter or prescription medicines for pain, discomfort, or fever only as directed by your caregiver.  Avoid lifting or driving until you are instructed otherwise.  If you have been sent home with a continuous passive motion machine, use it as directed by your caregiver. SEEK MEDICAL CARE IF:  You have difficulty breathing.  Your wound is red, swollen, or has become increasingly painful.  You have pus draining from your wound.  You have a bad smell coming from your wound.  You have persistent bleeding from your wound.  Your wound breaks open after sutures (stitches) or staples have been removed. SEEK IMMEDIATE MEDICAL CARE IF:   You have a fever.  You have a rash.  You have pain or swelling in your calf or thigh.  You have shortness of breath or chest pain.  Your range of motion in your knee is decreasing rather than increasing. MAKE SURE YOU:   Understand these instructions.  Will watch your condition.  Will get help right away if you are not doing well or get worse. Document Released: 09/15/2004 Document Revised: 08/28/2011 Document Reviewed:  04/17/2011 [facility] Patient Information 2015 [place], [place]. This information is not intended to replace advice given to you by your health care provider. Make sure you discuss any questions you have with your health care provider.

## 2013-09-23 NOTE — Discharge Summary (Addendum)
Patient ID: JOHNTA COUTS MRN: [id number] DOB/AGE: Apr 06, 1940 73 y.o.  Admit date: 09/23/2013 Discharge date: 09/26/2013  Admission Diagnoses:  Active Problems:   DJD (degenerative joint disease) of knee   Discharge Diagnoses:  Same ABLA  Past Medical History  Diagnosis Date  . Arthritis   . Spastic colon   . Chronic sinusitis   . Coronary artery disease   . Hypertension   . Depression   . Difficult intubation     was told with shoulder done 2006-alittle narrow  . Jejunostomy tube fell out   . Fall against sharp object     stiches in head    Surgeries: Procedure(s): TOTAL KNEE ARTHROPLASTY on 09/23/2013   Consultants:  Hospitalist  Discharged Condition: Improved  Hospital Course: RUSHTON EARLY is an 73 y.o. male who was admitted 09/23/2013 for operative treatment of right knee osteoarthritis. Patient has severe unremitting pain that affects sleep, daily activities, and work/hobbies. After pre-op clearance the patient was taken to the operating room on 09/23/2013 and underwent  Procedure(s): TOTAL KNEE ARTHROPLASTY.  Pre-op hgb was 15.4.  Post-op day 1, [place] developed acute blood loss anemia and hgb dropped to 12.0.  Patient is currently asymptomatic but we will continue to follow. Patient became somewhat confused post-op day 1 and remains alert and oriented to person only.  Changing d/c plans to SNF.  Will continue to monitor patient.  Patient was given perioperative antibiotics:     Anti-infectives   Start     Dose/Rate Route Frequency Ordered Stop   09/23/13 1800  ceFAZolin (ANCEF) IVPB 2 g/50 mL premix     2 g 100 mL/hr over 30 Minutes Intravenous Every 6 hours 09/23/13 1535 09/24/13 0102   09/23/13 0600  ceFAZolin (ANCEF) IVPB 2 g/50 mL premix     2 g 100 mL/hr over 30 Minutes Intravenous On call to O.R. 09/22/13 1459 09/23/13 1152       Patient was given sequential compression devices, early ambulation, and chemoprophylaxis to prevent DVT.  Patient  benefited maximally from hospital stay and there were no complications.    Recent vital signs:  Patient Vitals for the past 24 hrs:  BP Temp Temp src Pulse Resp SpO2  09/25/13 1138 - - - - 18 -  09/25/13 0750 - - - - 18 -  09/25/13 0545 125/63 mmHg 97.4 F (36.3 C) Oral 96 18 94 %  09/25/13 0400 - - - - 17 -  09/25/13 0000 - - - - 16 -  09/24/13 2017 95/70 mmHg 97.4 F (36.3 C) Oral 87 18 94 %  09/24/13 2000 - - - - 16 94 %  09/24/13 1422 94/76 mmHg 97.8 F (36.6 C) Oral 83 14 93 %     Recent laboratory studies:   Recent Labs  09/24/13 0603 09/25/13 0713  WBC 12.9* 19.8*  HGB 12.0* 10.9*  HCT 36.3* 33.0*  PLT 191 191  NA 137 134*  K 5.5* 5.0  CL 100 99  CO2 21 24  BUN 16 35*  CREATININE 1.10 1.30  GLUCOSE 163* 152*  CALCIUM 8.7 8.9     Discharge Medications:     Medication List    STOP taking these medications       aspirin 81 MG tablet  Replaced by:  aspirin EC 325 MG tablet      TAKE these medications       acetaminophen 325 MG tablet  Commonly known as:  TYLENOL  Take 650 mg by mouth  every 6 (six) hours as needed for moderate pain.     aspirin EC 325 MG tablet  Take 1 tablet (325 mg total) by mouth daily.     bisacodyl 5 MG EC tablet  Commonly known as:  DULCOLAX  Take 1 tablet (5 mg total) by mouth daily as needed for moderate constipation.     carvedilol 6.25 MG tablet  Commonly known as:  COREG  Take 6.25 mg by mouth 2 (two) times daily with a meal.     clopidogrel 75 MG tablet  Commonly known as:  PLAVIX  Take 75 mg by mouth at bedtime.     lisinopril 10 MG tablet  Commonly known as:  PRINIVIL,ZESTRIL  Take 10 mg by mouth daily.     methocarbamol 500 MG tablet  Commonly known as:  ROBAXIN  Take 1 tablet (500 mg total) by mouth 4 (four) times daily.     ondansetron 4 MG tablet  Commonly known as:  ZOFRAN  Take 1 tablet (4 mg total) by mouth every 8 (eight) hours as needed for nausea or vomiting.     oxyCODONE-acetaminophen 5-325  MG per tablet  Commonly known as:  ROXICET  Take 1-2 tablets by mouth every 4 (four) hours as needed.     PARoxetine 20 MG tablet  Commonly known as:  PAXIL  Take 20 mg by mouth every evening.     polycarbophil 625 MG tablet  Commonly known as:  FIBERCON  Take 625 mg by mouth daily.     rosuvastatin 5 MG tablet  Commonly known as:  CRESTOR  Take 5 mg by mouth every morning.        Diagnostic Studies: Ct Head Wo Contrast  08/26/2013   CLINICAL DATA:  FALL  EXAM: CT HEAD WITHOUT CONTRAST  TECHNIQUE: Contiguous axial images were obtained from the base of the skull through the vertex without intravenous contrast.  COMPARISON:  Head CT dated 04/02/2013.  FINDINGS: Scalp hematoma right frontal region. Diffuse age-appropriate global atrophy. Diffuse areas of low attenuation within the subcortical, deep, and periventricular white matter regions consistent with small vessel ischemia. No acute intracranial abnormality. Specifically, no hemorrhage, hydrocephalus, mass lesion, acute infarction, or significant intracranial injury. No acute calvarial abnormality. Stable areas of mucosal thickening and opacification in the ethmoid air cells. Paranasal sinuses otherwise patent.  IMPRESSION: Scalp hematoma right frontal region. No acute intracranial abnormality. Chronic and involutional changes.   Electronically Signed   By: Margaree Mackintosh M.D.   On: 08/26/2013 22:22    CT chest pod# 1:  Hx of stroke.  Negative for PE or pneumonia  Disposition: 01-Home or Self Care  Discharge Instructions   CPM    Complete by:  As directed   Continuous passive motion machine (CPM):      Use the CPM from 0- to 60 for 6 hours per day.      You may increase by 10 per day.  You may break it up into 2 or 3 sessions per day.      Use CPM for 2-3 weeks or until you are told to stop.     Call MD / Call 911    Complete by:  As directed   If you experience chest pain or shortness of breath, CALL 911 and be transported to the  hospital emergency room.  If you develope a fever above 101 F, pus (white drainage) or increased drainage or redness at the wound, or calf pain, call your surgeon's office.  Change dressing    Complete by:  As directed   Change dressing on Saturday, then change the dressing daily with sterile 4 x 4 inch gauze dressing and apply TED hose.  You may clean the incision with alcohol prior to redressing.     Constipation Prevention    Complete by:  As directed   Drink plenty of fluids.  Prune juice may be helpful.  You may use a stool softener, such as Colace (over the counter) 100 mg twice a day.  Use MiraLax (over the counter) for constipation as needed.     Diet - low sodium heart healthy    Complete by:  As directed      Discharge instructions    Complete by:  As directed   Weight bearing as tolerated.  Take Aspirin 1 tab a day for the next 30 days to prevent blood clots.  Change dressing daily starting on Saturday.  May shower on Monday but do not soak incision.  May apply ice for up to 20 minutes at a time for pain and swelling.  Follow up appointment in two weeks.     Do not put a pillow under the knee. Place it under the heel.    Complete by:  As directed   Place gray foam under operative heel when in bed or in a chair to work on extension     Increase activity slowly as tolerated    Complete by:  As directed      TED hose    Complete by:  As directed   Use stockings (TED hose) for 2 weeks on both leg(s).  You may remove them at night for sleeping.           Follow-up Information   Follow up with [facility], MD. Schedule an appointment as soon as possible for a visit in 2 weeks.   Specialty:  Orthopedic Surgery   Contact information:   [place] [address] [phone]        Signed: Larae Grooms 09/25/2013, 1:00 PM

## 2013-09-23 NOTE — Progress Notes (Signed)
Orthopedic Tech Progress Note Patient Details:  George Vega [birth year] [id number]  CPM Right Knee Additional Comments: CPM and footsie left for patient use to morning per orders   Ashok Cordia 09/23/2013, 8:10 PM

## 2013-09-23 NOTE — H&P (View-Only) (Signed)
TOTAL KNEE ADMISSION H&P  Patient is being admitted for right total knee arthroplasty.  Subjective:  Chief Complaint:right knee pain.  HPI: George Vega, 73 y.o. male, has a history of pain and functional disability in the right knee due to arthritis and has failed non-surgical conservative treatments for greater than 12 weeks to includeNSAID's and/or analgesics, corticosteriod injections and activity modification.  Onset of symptoms was gradual, starting >10 years ago with gradually worsening course since that time. The patient noted prior procedures on the knee to include  arthroscopy and menisectomy on the right knee(s).  Patient currently rates pain in the right knee(s) at 5 out of 10 with activity. Patient has night pain, worsening of pain with activity and weight bearing, pain that interferes with activities of daily living, pain with passive range of motion and joint swelling.  Patient has evidence of subchondral cysts, subchondral sclerosis and joint space narrowing by imaging studies. There is no active infection.  Patient Active Problem List   Diagnosis Date Noted  . Facial droop 04/02/2013  . Ataxia 04/02/2013  . Leukocytosis 04/02/2013  . CAD (coronary artery disease) 04/02/2013  . Hyperlipemia 04/02/2013  . HTN (hypertension) 04/02/2013  . Rash 04/02/2013  . CAD 11/04/2008  . DEPRESSION 11/03/2008  . DYSTHYMIC DISORDER 10/13/2008  . ECHOCARDIOGRAM, ABNORMAL 10/13/2008  . STRESS ELECTROCARDIOGRAM, ABNORMAL 10/13/2008   Past Medical History  Diagnosis Date  . Arthritis   . Spastic colon   . Chronic sinusitis   . Coronary artery disease   . Hypertension   . Depression   . Difficult intubation     was told with shoulder done 2006-alittle narrow    Past Surgical History  Procedure Laterality Date  . Uvulopalatopharyngoplasty    . 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 Nasal sinus surgery    . Shoulder arthroscopy  03/01/2011    Procedure: ARTHROSCOPY  SHOULDER;  Surgeon: Ninetta Lights, MD;  Location: [place];  Service: Orthopedics;  Laterality: Right;  arthroscopy shoulder decompression subacromial partial acromioplasty with coracoacromial release, distal claviculectomy, debridement of labrium  . Foot neuroma surgery  2002  . Shoulder arthroscopy  03/01/2011    Procedure: ARTHROSCOPY SHOULDER;  Surgeon: Ninetta Lights, MD;  Location: [place];  Service: Orthopedics;  Laterality: Right;  arthroscopy shoulder decompression subacromial partial acromioplasty with coracoacromial release, distal claviculectomy, debridement of labrium  . Cardiac catheterization  10/2008    normal  . Cardiac catheterization  4/13    no chg  . Total shoulder arthroplasty  06/28/2011    Procedure: TOTAL SHOULDER ARTHROPLASTY;  Surgeon: Ninetta Lights, MD;  Location: [place];  Service: Orthopedics;  Laterality: Right;     (Not in a hospital admission) Allergies  Allergen Reactions  . Tetanus Toxoid Anaphylaxis and Hives    REACTION: hives/throat swells shut  . Fish Oil Other (See Comments)    cramping  . Glucosamine Other (See Comments)    cramping  . Ibuprofen Nausea Only and Other (See Comments)    Stomach pains    History  Substance Use Topics  . Smoking status: Former Smoker    Quit date: 02/27/1998  . Smokeless tobacco: Not on file  . Alcohol Use:     No family history on file.   Review of Systems  Constitutional: Negative.   HENT: Negative.   Eyes: Negative.   Respiratory: Negative.   Cardiovascular: Negative.   Gastrointestinal: Negative.   Genitourinary:  Negative.   Musculoskeletal: Positive for back pain and joint pain.  Skin: Negative.   Neurological: Negative.   Endo/Heme/Allergies: Bruises/bleeds easily.  Psychiatric/Behavioral: Negative.     Objective:  Physical Exam  Constitutional: He is oriented to person, place, and time. He appears well-developed and well-nourished.   HENT:  Head: Normocephalic and atraumatic.  Eyes: EOM are normal. Pupils are equal, round, and reactive to light.  Neck: Normal range of motion.  Cardiovascular: Normal rate and regular rhythm.  Exam reveals no gallop and no friction rub.   No murmur heard. Respiratory: Effort normal and breath sounds normal. No respiratory distress. He has no wheezes. He has no rales.  GI: Soft. Bowel sounds are normal. He exhibits no distension.  Musculoskeletal:  Range of motion 0-130 degrees.  Varus thrust on the right.  Tenderness to palpation medial joint line.  No patellofemoral crepitus noted.  Neurovascularly intact distally.  Neurological: He is alert and oriented to person, place, and time.  Skin: Skin is warm and dry.  Psychiatric: He has a normal mood and affect. His behavior is normal. Judgment and thought content normal.    Vital signs in last 24 hours: @VSRANGES @  Labs:   Estimated body mass index is 29.99 kg/(m^2) as calculated from the following:   Height as of 08/26/13: 5\' 10"  (1.778 m).   Weight as of 08/26/13: 94.802 kg (209 lb).   Imaging Review Plain radiographs demonstrate severe degenerative joint disease of the right knee(s). The overall alignment ismild varus. The bone quality appears to be fair for age and reported activity level.  Assessment/Plan:  End stage arthritis, right knee   The patient history, physical examination, clinical judgment of the provider and imaging studies are consistent with end stage degenerative joint disease of the right knee(s) and total knee arthroplasty is deemed medically necessary. The treatment options including medical management, injection therapy arthroscopy and arthroplasty were discussed at length. The risks and benefits of total knee arthroplasty were presented and reviewed. The risks due to aseptic loosening, infection, stiffness, patella tracking problems, thromboembolic complications and other imponderables were discussed. The  patient acknowledged the explanation, agreed to proceed with the plan and consent was signed. Patient is being admitted for inpatient treatment for surgery, pain control, PT, OT, prophylactic antibiotics, VTE prophylaxis, progressive ambulation and ADL's and discharge planning. The patient is planning to be discharged home with home health services

## 2013-09-23 NOTE — Anesthesia Procedure Notes (Addendum)
Procedure Name: MAC Performed by: Scheryl Darter Pre-anesthesia Checklist: Patient identified, Emergency Drugs available, Suction available, Patient being monitored and Timeout performed Ventilation: Nasal airway inserted- appropriate to patient size Placement Confirmation: positive ETCO2 and breath sounds checked- equal and bilateral Dental Injury: Teeth and Oropharynx as per pre-operative assessment    Spinal  Patient location during procedure: OR Start time: 09/23/2013 11:30 AM End time: 09/23/2013 11:35 AM Staffing Performed by: anesthesiologist  Preanesthetic Checklist Completed: patient identified, site marked, surgical consent, pre-op evaluation, timeout performed, IV checked, risks and benefits discussed and monitors and equipment checked Spinal Block Patient position: right lateral decubitus Prep: ChloraPrep Patient monitoring: heart rate, cardiac monitor, continuous pulse ox and blood pressure Approach: right paramedian Location: L3-4 Injection technique: single-shot Needle Needle type: Tuohy  Needle gauge: 22 G Needle length: 9 cm Additional Notes 8.0 mg 0.75% marcaine injected easily, clear CSF

## 2013-09-23 NOTE — Anesthesia Preprocedure Evaluation (Addendum)
Anesthesia Evaluation  Patient identified by MRN, date of birth, ID band Patient awake    Reviewed: Allergy & Precautions, H&P , NPO status , Patient's Chart, lab work & pertinent test results  History of Anesthesia Complications (+) DIFFICULT AIRWAY  Airway Mallampati: II TM Distance: >3 FB Neck ROM: Full    Dental  (+) Teeth Intact, Dental Advisory Given   Pulmonary former smoker,  breath sounds clear to auscultation        Cardiovascular hypertension, + CAD Rhythm:Regular Rate:Normal     Neuro/Psych    GI/Hepatic   Endo/Other    Renal/GU      Musculoskeletal   Abdominal   Peds  Hematology   Anesthesia Other Findings   Reproductive/Obstetrics                          Anesthesia Physical Anesthesia Plan  ASA: III  Anesthesia Plan: Spinal   Post-op Pain Management:    Induction: Intravenous  Airway Management Planned: Natural Airway and Simple Face Mask  Additional Equipment:   Intra-op Plan:   Post-operative Plan:   Informed Consent: I have reviewed the patients History and Physical, chart, labs and discussed the procedure including the risks, benefits and alternatives for the proposed anesthesia with the patient or authorized representative who has indicated his/her understanding and acceptance.     Plan Discussed with: CRNA and Anesthesiologist  Anesthesia Plan Comments: (Plan SAB off plavix > 3 weeks)       Anesthesia Quick Evaluation

## 2013-09-23 NOTE — Transfer of Care (Signed)
Immediate Anesthesia Transfer of Care Note  Patient: George Vega  Procedure(s) Performed: Procedure(s): TOTAL KNEE ARTHROPLASTY (Right)  Patient Location: PACU  Anesthesia Type:MAC and Spinal  Level of Consciousness: awake, alert , oriented and sedated  Airway & Oxygen Therapy: Patient Spontanous Breathing and Patient connected to nasal cannula oxygen  Post-op Assessment: Report given to PACU RN, Post -op Vital signs reviewed and stable and Patient moving all extremities  Post vital signs: Reviewed and stable  Complications: No apparent anesthesia complications

## 2013-09-23 NOTE — Anesthesia Postprocedure Evaluation (Signed)
  Anesthesia Post-op Note  Patient: George Vega  Procedure(s) Performed: Procedure(s): TOTAL KNEE ARTHROPLASTY (Right)  Patient Location: PACU  Anesthesia Type:Spinal  Level of Consciousness: awake, alert  and oriented  Airway and Oxygen Therapy: Patient Spontanous Breathing  Post-op Pain: mild  Post-op Assessment: Post-op Vital signs reviewed, Patient's Cardiovascular Status Stable, Respiratory Function Stable, Patent Airway and Pain level controlled  Post-op Vital Signs: stable  Last Vitals:  Filed Vitals:   09/23/13 1530  BP: 110/60  Pulse: 88  Temp: 36.7 C  Resp: 18    Complications: No apparent anesthesia complications

## 2013-09-24 LAB — CBC
HCT: 36.3 % — ABNORMAL LOW (ref 39.0–52.0)
Hemoglobin: 12 g/dL — ABNORMAL LOW (ref 13.0–17.0)
MCH: 31.7 pg (ref 26.0–34.0)
MCHC: 33.1 g/dL (ref 30.0–36.0)
MCV: 96 fL (ref 78.0–100.0)
PLATELETS: 191 10*3/uL (ref 150–400)
RBC: 3.78 MIL/uL — AB (ref 4.22–5.81)
RDW: 13.4 % (ref 11.5–15.5)
WBC: 12.9 10*3/uL — ABNORMAL HIGH (ref 4.0–10.5)

## 2013-09-24 LAB — BASIC METABOLIC PANEL
ANION GAP: 16 — AB (ref 5–15)
BUN: 16 mg/dL (ref 6–23)
CHLORIDE: 100 meq/L (ref 96–112)
CO2: 21 mEq/L (ref 19–32)
Calcium: 8.7 mg/dL (ref 8.4–10.5)
Creatinine, Ser: 1.1 mg/dL (ref 0.50–1.35)
GFR calc non Af Amer: 65 mL/min — ABNORMAL LOW (ref >90)
GFR, EST AFRICAN AMERICAN: 75 mL/min — AB (ref >90)
Glucose, Bld: 163 mg/dL — ABNORMAL HIGH (ref 70–99)
Potassium: 5.5 mEq/L — ABNORMAL HIGH (ref 3.7–5.3)
Sodium: 137 mEq/L (ref 137–147)

## 2013-09-24 NOTE — Progress Notes (Signed)
Subjective: 1 Day Post-Op Procedure(s) (LRB): TOTAL KNEE ARTHROPLASTY (Right) Patient reports pain as 2 on 0-10 scale.  No nausea/vomiting, lightheadedness/dizziness.  No flatus and no bm as of yet.    Objective: Vital signs in last 24 hours: Temp:  [97.5 F (36.4 C)-98.8 F (37.1 C)] 98.8 F (37.1 C) (07/16 0551) Pulse Rate:  [64-95] 91 (07/16 0551) Resp:  [10-19] 18 (07/16 0551) BP: (110-161)/(60-110) 151/92 mmHg (07/16 0551) SpO2:  [92 %-100 %] 97 % (07/16 0551) Weight:  [91.173 kg (201 lb)] 91.173 kg (201 lb) (07/15 1011)  Intake/Output from previous day: 07/15 0701 - 07/16 0700 In: 2403 [P.O.:1040; I.V.:1350] Out: 1210 [Urine:525; Drains:585; Blood:100] Intake/Output this shift:    No results found for this basename: HGB,  in the last 72 hours No results found for this basename: WBC, RBC, HCT, PLT,  in the last 72 hours No results found for this basename: NA, K, CL, CO2, BUN, CREATININE, GLUCOSE, CALCIUM,  in the last 72 hours No results found for this basename: LABPT, INR,  in the last 72 hours  Neurologically intact Neurovascular intact Sensation intact distally Intact pulses distally Dorsiflexion/Plantar flexion intact Compartment soft hemovac drain pulled by me today No drainage through dressing  Assessment/Plan: 1 Day Post-Op Procedure(s) (LRB): TOTAL KNEE ARTHROPLASTY (Right) Advance diet Up with therapy D/C IV fluids Discharge home with home health WBAT RLE Please apply CPM machine  ANTON, M. LINDSEY 09/24/2013, 7:01 AM

## 2013-09-24 NOTE — Evaluation (Signed)
Occupational Therapy Evaluation Patient Details Name: George Vega MRN: [id number] DOB: 26-Mar-1940 Today's Date: 09/24/2013    History of Present Illness s/p Rt TKA    Clinical Impression   This 73 yo male admitted and underwent above presents to acute OT with decreased memory, increased pain, decreased mobility, decreased safety all affecting pt's ability to care for himself and for his wife to help him. He will benefit from acute OT with follow up OT at SNF.    Follow Up Recommendations  SNF    Equipment Recommendations  3 in 1 bedside comode       Precautions / Restrictions Precautions Precautions: Fall;Knee Other Brace/Splint: footsie roll Restrictions Weight Bearing Restrictions: No RLE Weight Bearing: Weight bearing as tolerated      Mobility Bed Mobility Overal bed mobility: Needs Assistance Bed Mobility: Supine to Sit     Supine to sit: Min guard (HOB flat and no rail)        Transfers Overall transfer level: Needs assistance Equipment used: Rolling walker (2 wheeled) Transfers: Sit to/from Stand Sit to Stand: Min assist Stand pivot transfers: Min assist       General transfer comment: VCs for safe hand placement    Balance Overall balance assessment: Needs assistance Sitting-balance support: Feet supported;No upper extremity supported Sitting balance-Leahy Scale: Fair   Postural control: Right lateral lean (in sitting) Standing balance support: Single extremity supported Standing balance-Leahy Scale: Poor                              ADL Overall ADL's : Needs assistance/impaired Eating/Feeding: Independent;Sitting   Grooming: Set up;Supervision/safety;Sitting   Upper Body Bathing: Set up;Supervision/ safety;Sitting   Lower Body Bathing: Moderate assistance;Sit to/from stand   Upper Body Dressing : Set up;Supervision/safety;Sitting   Lower Body Dressing: Maximal assistance;Sit to/from stand   Toilet Transfer: Minimal  assistance;Ambulation;RW;Comfort height toilet;Grab bars   Toileting- Clothing Manipulation and Hygiene: Minimal assistance;Sit to/from stand                         Pertinent Vitals/Pain 8/10 RLE; repositioned, ice applied, and RN made aware     Hand Dominance Right   Extremity/Trunk Assessment Upper Extremity Assessment Upper Extremity Assessment: Overall WFL for tasks assessed           Communication Communication Communication: No difficulties   Cognition Arousal/Alertness: Awake/alert Behavior During Therapy: WFL for tasks assessed/performed Overall Cognitive Status: Impaired/Different from baseline Area of Impairment: Orientation Orientation Level: Time (July 25th, 2016 (it is September 24, 2013))                            Home Living Family/patient expects to be discharged to:: Private residence Living Arrangements: Spouse/significant other Available Help at Discharge: Family;Available 24 hours/day Type of Home: House Home Access: Stairs to enter [employer] of Steps: 2 Entrance Stairs-Rails: Right Home Layout: Able to live on main level with bedroom/bathroom     Bathroom Shower/Tub: Occupational psychologist: Standard     Home Equipment: Cane - single point   Additional Comments: pt has walk-in shower       Prior Functioning/Environment Level of Independence: Independent with assistive device(s)        Comments: pt ambulates with cane for past month due to pain  OT Goals(Current goals can be found in the care plan section) Acute Rehab OT Goals Patient Stated Goal: home tomorrow maybe OT Goal Formulation: With patient Time For Goal Achievement: 10/01/13 Potential to Achieve Goals: Good  OT Frequency:                End of Session Equipment Utilized During Treatment: Gait belt;Rolling walker CPM Right Knee Additional Comments: CPM and footsie left on patient  Activity Tolerance: Patient  limited by pain Patient left: in chair;with call bell/phone within reach;with chair alarm set   Time: 2703-5009 OT Time Calculation (min): 35 min Charges:  OT General Charges $OT Visit: 1 Procedure OT Evaluation $Initial OT Evaluation Tier I: 1 Procedure OT Treatments $Self Care/Home Management : 23-37 mins  Almon Register [phone] 09/24/2013, 11:33 AM

## 2013-09-24 NOTE — Op Note (Signed)
NAMEMarland Kitchen  George Vega, George Vega NO.:  [id number]  MEDICAL RECORD NO.:  [id number]  LOCATION:  5N01C                        FACILITY:  [facility]  PHYSICIAN:  Ninetta Lights, M.D. DATE OF BIRTH:  05-Feb-1941  DATE OF PROCEDURE: DATE OF DISCHARGE:                              OPERATIVE REPORT   PREOPERATIVE DIAGNOSIS:  End-stage tricompartmental degenerative arthritis, right knee.  POSTOPERATIVE DIAGNOSIS:  End-stage tricompartmental degenerative arthritis, right knee.  PROCEDURE:  Modified minimally invasive right total knee replacement Stryker triathlon prosthesis.  Cemented pegged posterior stabilized #6 femoral component.  Cemented #6 tibial component, 9 mm polyethylene insert.  Cemented resurfacing 38-mm patellar component.  Soft tissue balancing.  SURGEON:  Ninetta Lights, MD  ASSISTANT:  Doran Stabler, PA, present throughout the entire case and necessary for timely completion of procedure.  ANESTHESIA:  Spinal.  BLOOD LOSS:  Minimal.  SPECIMENS:  None.  CULTURES:  None.  COMPLICATIONS:  None.  DRESSINGS:  Soft compressive knee immobilizer.  DRAINS:  Hemovac x1.  TOURNIQUET TIME:  1 hour.  DESCRIPTION OF PROCEDURE:  Patient was brought to the operating room and after adequate anesthesia had been obtained, tourniquet applied, prepped and draped in usual sterile fashion.  Exsanguinated with elevation of Esmarch.  Tourniquet inflated to 350 mmHg.  Straight incision above the patella down the tibial tubercle.  Skin and subcutaneous tissue divided. Medial arthrotomy, vastus splitting, preserving quad tendon.  Medial capsular release.  Intramedullary guide, distal femur.  An 8 mm resection, 5 degrees of valgus.  Using epicondylar axis, the femur was sized, cut, and fitted for a pegged #6 femoral component.  Proximal tibial resection extramedullary guide.  Size #6 component.  Patella exposed, posterior 10 mm removed, drilled, sized, and fitted for  38-mm component.  Trial was put in place.  9 mm insert.  Good motion, good stability.  Good tracking.  Tibia was marked for rotation and hand reamed.  All trials removed.  Copious irrigation with a pulse irrigating device.  Cement prepared, and placement of all components, firmly seated.  Polyethylene attached to tibia, knee reduced.  Patella held with a clamp.  Once cement hardened, the knee was irrigated once again. Soft tissue was injected with Exparel.  Arthrotomy closed with #1 Vicryl, skin and subcutaneous tissue with a subcutaneous subcuticular closure.  Margins were then injected with Marcaine.  Sterile compressive dressing applied.  Prior to closure, Hemovac had been placed and brought through a separate stab wound.  After the dressing was applied. Tourniquet was deflated and removed.  Knee immobilizer applied. Anesthesia reversed, brought to the recovery room.  Tolerated the surgery well.  No complications.     Ninetta Lights, M.D.     DFM/MEDQ  D:  09/23/2013  T:  09/24/2013  Job:  [id number]

## 2013-09-24 NOTE — Progress Notes (Signed)
Seen and agreed 09/24/2013 Jacqualyn Posey PTA [phone] pager [phone] office

## 2013-09-24 NOTE — Progress Notes (Signed)
Agree with change in D/C disposition due to lack of progress. [place], [place], [place]

## 2013-09-24 NOTE — Progress Notes (Signed)
Physical Therapy Treatment Patient Details Name: George Vega MRN: [id number] DOB: 08/31/1940 Today's Date: 09/24/2013    History of Present Illness s/p Rt TKA     PT Comments    Pt still confused but much more alert this session. Was able to amb more distance increasing gait velocity and progressing to a step-through pattern. Pt was much more aware during exercises.  Continue to recommend SNF for ongoing Physical Therapy due to state of confusion and disorientation and wife not sure if she can assist him in the home.  Follow Up Recommendations  Home health PT;Supervision/Assistance - 24 hour     Equipment Recommendations  Rolling walker with 5" wheels;3in1 (PT)    Recommendations for Other Services       Precautions / Restrictions Precautions Precautions: Knee Other Brace/Splint: footsie roll Restrictions Weight Bearing Restrictions: No RLE Weight Bearing: Weight bearing as tolerated    Mobility  Bed Mobility Overal bed mobility: Needs Assistance Bed Mobility: Supine to Sit     Supine to sit: Supervision     General bed mobility comments: verbal cues on how use UE to sit up from supine. verbal cues for hand placement when coming to sitting.   Transfers Overall transfer level: Needs assistance Equipment used: Rolling walker (2 wheeled) Transfers: Sit to/from Stand Sit to Stand: Min guard Stand pivot transfers: Min assist       General transfer comment: min guard for safety. verbal cues for hand placement on RW coming from sit to stand. verbal cues for hand placement and body positioning when going into sitting.  Ambulation/Gait Ambulation/Gait assistance: Min guard Ambulation Distance (Feet): 50 Feet Assistive device: Rolling walker (2 wheeled) Gait Pattern/deviations: Step-to pattern Gait velocity: decreased Gait velocity interpretation: Below normal speed for age/gender General Gait Details: pts stance was much more narrow and pt is starting to amb  with a step-through pattern. Gait velocity is increasing but still slow. verbal cues for rolling walker vs picking up and moving each time.    Stairs            Wheelchair Mobility    Modified Rankin (Stroke Patients Only)       Balance Overall balance assessment: Needs assistance Sitting-balance support: Feet supported;No upper extremity supported Sitting balance-Leahy Scale: Fair   Postural control: Right lateral lean (in sitting) Standing balance support: Single extremity supported Standing balance-Leahy Scale: Poor                      Cognition Arousal/Alertness: Awake/alert Behavior During Therapy: WFL for tasks assessed/performed Overall Cognitive Status: Impaired/Different from baseline Area of Impairment: Orientation Orientation Level: Disoriented to;Time             General Comments: Pt was still confused this session,but much better than first session.    Exercises Total Joint Exercises Ankle Circles/Pumps: AROM;Both;20 reps;Seated Quad Sets: AROM;10 reps;Seated;Right Heel Slides: AAROM;Right;Seated;5 reps Hip ABduction/ADduction: AAROM;Seated;Right;10 reps Straight Leg Raises: AAROM;Seated;Right;5 reps Long Arc Quad: AAROM;Right;10 reps;Seated    General Comments        Pertinent Vitals/Pain no apparent distress. Pt repositioned in recliner for comfort with footsie roll.      Home Living Family/patient expects to be discharged to:: Private residence Living Arrangements: Spouse/significant other Available Help at Discharge: Family;Available 24 hours/day Type of Home: House Home Access: Stairs to enter Entrance Stairs-Rails: Right Home Layout: Able to live on main level with bedroom/bathroom Home Equipment: Cane - single point Additional Comments: pt has walk-in shower  Prior Function Level of Independence: Independent with assistive device(s)      Comments: pt ambulates with cane for past month due to pain    PT Goals  (current goals can now be found in the care plan section) Acute Rehab PT Goals Patient Stated Goal: home tomorrow maybe Progress towards PT goals: Progressing toward goals    Frequency  7X/week    PT Plan Discharge plan needs to be updated    Co-evaluation             End of Session Equipment Utilized During Treatment: Gait belt Activity Tolerance: Patient tolerated treatment well Patient left: in chair;with call bell/phone within reach     Time: 1421-1449 PT Time Calculation (min): 28 min  Charges:  $Gait Training: 8-22 mins $Therapeutic Exercise: 8-22 mins                    G Codes:      BRASFIELD,Carsen Machi, SPTA 09/24/2013, 3:12 PM

## 2013-09-24 NOTE — Care Management Note (Signed)
CARE MANAGEMENT NOTE 09/24/2013  Patient:  George Vega, George Vega   Account Number:  [id number]  Date Initiated:  09/24/2013  Documentation initiated by:  Ricki Miller  Subjective/Objective Assessment:   73 yr old male s/p right total knee arthroplasty.     Action/Plan:   Patient preoperatively setup with [facility]. Will reneed shortterm rehab at [facility]. Social worker notified.   Anticipated DC Date:  09/26/2013   Anticipated DC Plan:  SKILLED NURSING FACILITY  In-house referral  Clinical Social Worker      DC Planning Services  CM consult      [facility] Choice  NA   Choice offered to / List presented to:     DME arranged  NA        [place] arranged  NA      Status of service:  In process, will continue to follow Medicare Important Message given?   (If response is "NO", the following Medicare IM given date fields will be blank) Date Medicare IM given:   Medicare IM given by:   Date Additional Medicare IM given:   Additional Medicare IM given by:    Discharge Disposition:  [place]  Per UR Regulation:  Reviewed for med. necessity/level of care/duration of stay  If discussed at [place] of Stay Meetings, dates discussed:    Comments:

## 2013-09-24 NOTE — Progress Notes (Signed)
Physical Therapy Treatment Patient Details Name: George Vega MRN: [id number] DOB: 07-Jul-1940 Today's Date: 09/24/2013    History of Present Illness s/p Rt TKA     PT Comments    Pt was very confused and disoriented this session. Pt had difficulty understanding instructions and applying corrections and feedback. Pt was able to perform exercises well with limited pain. Confusion and disorientation known to RN and MD.   Follow Up Recommendations  Home health PT;Supervision/Assistance - 24 hour     Equipment Recommendations  Rolling walker with 5" wheels;3in1 (PT)    Recommendations for Other Services       Precautions / Restrictions Precautions Precautions: Knee;Fall Other Brace/Splint: footsie roll Restrictions Weight Bearing Restrictions: No RLE Weight Bearing: Weight bearing as tolerated    Mobility  Bed Mobility Overal bed mobility: Needs Assistance Bed Mobility: Supine to Sit     Supine to sit: Min guard (HOB flat and no rail)     General bed mobility comments: pt in recliner before and after session  Transfers Overall transfer level: Needs assistance Equipment used: Rolling walker (2 wheeled) Transfers: Sit to/from Stand Sit to Stand: Min guard Stand pivot transfers: Min assist       General transfer comment: verbal cues for hand placement and for scooting to edge of chair before coming to standing..   Ambulation/Gait Ambulation/Gait assistance: Min assist Ambulation Distance (Feet): 40 Feet Assistive device: Rolling walker (2 wheeled) Gait Pattern/deviations: Step-to pattern;Wide base of support;Steppage;Decreased stride length Gait velocity: decreased Gait velocity interpretation: Below normal speed for age/gender General Gait Details: Pts stance is very wide. Pt tends to lean forward shortly after given a cue to stand straight and look forward. Pt was not able to understand or apply instructions from gait sequencing.    Stairs             Wheelchair Mobility    Modified Rankin (Stroke Patients Only)       Balance Overall balance assessment: Needs assistance Sitting-balance support: Feet supported;No upper extremity supported Sitting balance-Leahy Scale: Fair   Postural control: Right lateral lean (in sitting) Standing balance support: Single extremity supported Standing balance-Leahy Scale: Poor                      Cognition Arousal/Alertness: Awake/alert Behavior During Therapy: WFL for tasks assessed/performed Overall Cognitive Status: Impaired/Different from baseline Area of Impairment: Orientation Orientation Level: Disoriented to;Time             General Comments: Pt was very disoriented and confused during session. Pt was not able to follow instructions well.     Exercises Total Joint Exercises Ankle Circles/Pumps: AROM;Both;20 reps;Seated Quad Sets: AROM;Right;Seated;10 reps Heel Slides: AAROM;Right;Seated;5 reps Hip ABduction/ADduction: AAROM;Right;Seated;10 reps Straight Leg Raises: AAROM;Seated;Right;5 reps Long Arc Quad: AAROM;Seated;Right;5 reps    General Comments        Pertinent Vitals/Pain no apparent distress. Pt repositioned in recliner for comfort with pulse ox and oxygen.      Home Living Family/patient expects to be discharged to:: Private residence Living Arrangements: Spouse/significant other Available Help at Discharge: Family;Available 24 hours/day Type of Home: House Home Access: Stairs to enter Entrance Stairs-Rails: Right Home Layout: Able to live on main level with bedroom/bathroom Home Equipment: Cane - single point Additional Comments: pt has walk-in shower     Prior Function Level of Independence: Independent with assistive device(s)      Comments: pt ambulates with cane for past month due to pain  PT Goals (current goals can now be found in the care plan section) Acute Rehab PT Goals Patient Stated Goal: home tomorrow maybe Progress  towards PT goals: Progressing toward goals    Frequency  7X/week    PT Plan Current plan remains appropriate    Co-evaluation             End of Session Equipment Utilized During Treatment: Gait belt Activity Tolerance: Patient tolerated treatment well Patient left: in chair;with family/visitor present;with call bell/phone within reach     Time: 1042-1120 PT Time Calculation (min): 38 min  Charges:                       G Codes:      BRASFIELD,Khaylee Mcevoy,SPTA 09/24/2013, 12:55 PM

## 2013-09-24 NOTE — Plan of Care (Signed)
Problem: Consults Goal: Diagnosis- Total Joint Replacement Primary Total Knee Right     

## 2013-09-25 ENCOUNTER — Encounter (HOSPITAL_COMMUNITY): Payer: Self-pay | Admitting: Orthopedic Surgery

## 2013-09-25 ENCOUNTER — Inpatient Hospital Stay (HOSPITAL_COMMUNITY): Payer: Medicare Other

## 2013-09-25 DIAGNOSIS — D72829 Elevated white blood cell count, unspecified: Secondary | ICD-10-CM

## 2013-09-25 DIAGNOSIS — Z789 Other specified health status: Secondary | ICD-10-CM

## 2013-09-25 LAB — BASIC METABOLIC PANEL
ANION GAP: 11 (ref 5–15)
BUN: 35 mg/dL — ABNORMAL HIGH (ref 6–23)
CHLORIDE: 99 meq/L (ref 96–112)
CO2: 24 mEq/L (ref 19–32)
CREATININE: 1.3 mg/dL (ref 0.50–1.35)
Calcium: 8.9 mg/dL (ref 8.4–10.5)
GFR calc Af Amer: 62 mL/min — ABNORMAL LOW (ref >90)
GFR calc non Af Amer: 53 mL/min — ABNORMAL LOW (ref >90)
Glucose, Bld: 152 mg/dL — ABNORMAL HIGH (ref 70–99)
Potassium: 5 mEq/L (ref 3.7–5.3)
Sodium: 134 mEq/L — ABNORMAL LOW (ref 137–147)

## 2013-09-25 LAB — CBC
HCT: 33 % — ABNORMAL LOW (ref 39.0–52.0)
Hemoglobin: 10.9 g/dL — ABNORMAL LOW (ref 13.0–17.0)
MCH: 31.9 pg (ref 26.0–34.0)
MCHC: 33 g/dL (ref 30.0–36.0)
MCV: 96.5 fL (ref 78.0–100.0)
Platelets: 191 10*3/uL (ref 150–400)
RBC: 3.42 MIL/uL — ABNORMAL LOW (ref 4.22–5.81)
RDW: 13.7 % (ref 11.5–15.5)
WBC: 19.8 10*3/uL — ABNORMAL HIGH (ref 4.0–10.5)

## 2013-09-25 LAB — URINALYSIS, ROUTINE W REFLEX MICROSCOPIC
Bilirubin Urine: NEGATIVE
Glucose, UA: NEGATIVE mg/dL
Hgb urine dipstick: NEGATIVE
Ketones, ur: NEGATIVE mg/dL
Leukocytes, UA: NEGATIVE
Nitrite: NEGATIVE
PH: 5 (ref 5.0–8.0)
Protein, ur: NEGATIVE mg/dL
SPECIFIC GRAVITY, URINE: 1.022 (ref 1.005–1.030)
Urobilinogen, UA: 0.2 mg/dL (ref 0.0–1.0)

## 2013-09-25 MED ORDER — LORAZEPAM 1 MG PO TABS
1.0000 mg | ORAL_TABLET | Freq: Four times a day (QID) | ORAL | Status: DC | PRN
  Administered 2013-09-25 – 2013-09-26 (×3): 1 mg via ORAL
  Filled 2013-09-25 (×4): qty 1

## 2013-09-25 MED ORDER — THIAMINE HCL 100 MG/ML IJ SOLN
100.0000 mg | Freq: Every day | INTRAMUSCULAR | Status: DC
  Filled 2013-09-25: qty 1

## 2013-09-25 MED ORDER — LORAZEPAM 2 MG/ML IJ SOLN: 1.0000 mg | Freq: Four times a day (QID) | INTRAMUSCULAR | Status: DC | PRN

## 2013-09-25 MED ORDER — VITAMIN B-1 100 MG PO TABS
100.0000 mg | ORAL_TABLET | Freq: Every day | ORAL | Status: DC
  Administered 2013-09-25 – 2013-09-27 (×3): 100 mg via ORAL
  Filled 2013-09-25 (×3): qty 1

## 2013-09-25 MED ORDER — SODIUM CHLORIDE 0.9 % IV SOLN
INTRAVENOUS | Status: AC
  Administered 2013-09-25: 12:00:00 via INTRAVENOUS

## 2013-09-25 MED ORDER — FOLIC ACID 1 MG PO TABS
1.0000 mg | ORAL_TABLET | Freq: Every day | ORAL | Status: DC
  Administered 2013-09-25 – 2013-09-27 (×3): 1 mg via ORAL
  Filled 2013-09-25 (×3): qty 1

## 2013-09-25 MED ORDER — ADULT MULTIVITAMIN W/MINERALS CH
1.0000 | ORAL_TABLET | Freq: Every day | ORAL | Status: DC
  Administered 2013-09-25 – 2013-09-27 (×3): 1 via ORAL
  Filled 2013-09-25 (×3): qty 1

## 2013-09-25 NOTE — Progress Notes (Addendum)
Clinical Social Work Department BRIEF PSYCHOSOCIAL ASSESSMENT 09/25/2013  Patient:  George Vega, LIKINS     Account Number:  [id number]     Admit date:  09/23/2013  Clinical Social Worker:  Adair Laundry  Date/Time:  09/25/2013 11:30 AM  Referred by:  Physician  Date Referred:  09/25/2013 Referred for  SNF Placement   Other Referral:   Interview type:  Patient Other interview type:   Spoke with pt and pt wife at bedside    PSYCHOSOCIAL DATA Living Status:  WIFE Admitted from facility:   Level of care:   Primary support name:  [place]ley [place] Primary support relationship to patient:  SPOUSE Degree of support available:   Pt has good support    CURRENT CONCERNS  Other Concerns:    SOCIAL WORK ASSESSMENT / PLAN CSW aware of PT recommendation. CSW visited pt room and spoke with pt and pt wife. Pt present and eating lunch during assessment however pt wife contributed to most of the assessment. CSW spoke with pt and pt wife about recommendation and they were already aware. Pt and pt wife are agreeable to ST rehab and report that they have no preference at this time. CSW explained SNF referral process and both pt and pt wife are agreeable to referral being sent to all of [facility]. Pt wife did inform CSW she has heard of a few facilities and knows people that have been to facilities and she would want to look around. CSW to provide bed offers as soon as possible.   Assessment/plan status:  Psychosocial Support/Ongoing Assessment of Needs Other assessment/ plan:   Information/referral to community resources:   SNF list to be given with bed offers    PATIENT'S/FAMILY'S RESPONSE TO PLAN OF CARE: Pt and pt wife pleasant and agreeable to ST rehab at dc.       [place], [place]

## 2013-09-25 NOTE — Care Management Note (Signed)
CARE MANAGEMENT NOTE 09/25/2013  Patient:  George Vega, George Vega   Account Number:  [id number]  Date Initiated:  09/24/2013  Documentation initiated by:  Ricki Miller  Subjective/Objective Assessment:   73 yr old male s/p right total knee arthroplasty.     Action/Plan:   Patient preoperatively setup with [facility]. Will reneed shortterm rehab at [facility]. Social worker notified.   Anticipated DC Date:  09/28/2013   Anticipated DC Plan:  SKILLED NURSING FACILITY  In-house referral  Clinical Social Worker      DC Planning Services  CM consult      [facility] Choice  NA   Choice offered to / List presented to:     DME arranged  NA        [place] arranged  NA      Status of service:  In process, will continue to follow Medicare Important Message given?   (If response is "NO", the following Medicare IM given date fields will be blank) Date Medicare IM given:   Medicare IM given by:   Date Additional Medicare IM given:  09/25/2013 Additional Medicare IM given by:  Ricki Miller  Discharge Disposition:  [place]  Per UR Regulation:  Reviewed for med. necessity/level of care/duration of stay

## 2013-09-25 NOTE — Progress Notes (Signed)
Agree with change in D/C disposition due to incr confusion and lack of progress with mobility. [place], [place], [place]

## 2013-09-25 NOTE — Progress Notes (Signed)
CSW (Clinical Education officer, museum) provided pt wife with bed offers. Pt wife wanting to review bed offers and potentially visit facilities tomorrow before making a decision.   [place], [place]

## 2013-09-25 NOTE — Consult Note (Signed)
[facility] Medical Consultation  George Vega WUJ:[id number] DOB: 12/03/1940 DOA: 09/23/2013 PCP: [name], MD   Requesting physician: Percell Miller Date of consultation: 09/25/13 Reason for consultation: AMS  Impression/Recommendations Active Problems:   DJD (degenerative joint disease) of knee    AMS- appears to be alcohol withdrawal- 2- 6 oz of bourbon/night; placed on CIWA and was A+Ox3 during my exam.  Patient is a febrile but has an elevated WBC count- ?reactive but will get CBC with diff in AM and U/A and chest x ray Would limit IV pain meds - encourage incentive spirometry  Renal insuff- IVF, Cr trending up  [facility] will followup again tomorrow. Please contact me if I can be of assistance in the meanwhile. Thank you for this consultation.  Chief Complaint: AMS  HPI:  Patient was admitted on 7/15 for right total knee replacement.  He had surgery on the 16th.  Was doing well until 7/16 evening.  PT was working with him, when he was confused and disoriented this was around 1PM.  He was evaluated by PT again 3PM and was more alert.  This AM when patient was seen by Ortho PA, he was orietned to person only.  PA ordered U/A and CIWA.  Patient received 1 dose of ativan before I arrived.   Patient drinks 2- 6oz of bourbon/day- last drink was night before admission.   Pain in knee No pain with urination, no CP, no SOB, no fevers, no chills -asked about having his PSA drawn as he is trending the numbers and had not had one in last 2 years Said " I know it's a controversial test"   Currently patient is oriented and not having memory issues.  Wife and reports occasional memory issues at home   Review of Systems:  All systems reviewed, negative unless stated above  Past Medical History  Diagnosis Date  . Arthritis   . Spastic colon   . Chronic sinusitis   . Coronary artery disease   . Hypertension   . Depression   . Difficult intubation     was told  with shoulder done 2006-alittle narrow  . Jejunostomy tube fell out   . Fall against sharp object     stiches in head   Past Surgical History  Procedure Laterality Date  . Uvulopalatopharyngoplasty    . 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 Nasal sinus surgery    . Shoulder arthroscopy  03/01/2011    Procedure: ARTHROSCOPY SHOULDER;  Surgeon: Ninetta Lights, MD;  Location: [facility];  Service: Orthopedics;  Laterality: Right;  arthroscopy shoulder decompression subacromial partial acromioplasty with coracoacromial release, distal claviculectomy, debridement of labrium  . Foot neuroma surgery  2002  . Shoulder arthroscopy  03/01/2011    Procedure: ARTHROSCOPY SHOULDER;  Surgeon: Ninetta Lights, MD;  Location: [facility];  Service: Orthopedics;  Laterality: Right;  arthroscopy shoulder decompression subacromial partial acromioplasty with coracoacromial release, distal claviculectomy, debridement of labrium  . Total shoulder arthroplasty  06/28/2011    Procedure: TOTAL SHOULDER ARTHROPLASTY;  Surgeon: Ninetta Lights, MD;  Location: [place];  Service: Orthopedics;  Laterality: Right;  . Cardiac catheterization  10/2008  . Cardiac catheterization  4/13    20% LM, chronic occluded mid LAD, 50% ostial LCX, 20% mid RI, RCA with collaterals to mid LAD, mid 10% stenosis, EF 60% 06/14/11  . Total knee arthroplasty Right 09/23/2013    Procedure: TOTAL KNEE ARTHROPLASTY;  Surgeon: Ninetta Lights, MD;  Location: [place];  Service: Orthopedics;  Laterality: Right;   Social History:  reports that he quit smoking about 15 years ago. He does not have any smokeless tobacco history on file. He reports that he drinks alcohol. He reports that he does not use illicit drugs.  Allergies  Allergen Reactions  . Tetanus Toxoid Anaphylaxis and Hives    REACTION: hives/throat swells shut  . Fish Oil Other (See Comments)    cramping  . Glucosamine Other (See  Comments)    cramping  . Ibuprofen Nausea Only and Other (See Comments)    Stomach pains   History reviewed. No pertinent family history.  Prior to Admission medications   Medication Sig Start Date End Date Taking? Authorizing Provider  acetaminophen (TYLENOL) 325 MG tablet Take 650 mg by mouth every 6 (six) hours as needed for moderate pain.   Yes Historical Provider, MD  aspirin 81 MG tablet Take 81 mg by mouth daily.     Yes Historical Provider, MD  carvedilol (COREG) 6.25 MG tablet Take 6.25 mg by mouth 2 (two) times daily with a meal.   Yes Historical Provider, MD  clopidogrel (PLAVIX) 75 MG tablet Take 75 mg by mouth at bedtime.    Yes Historical Provider, MD  lisinopril (PRINIVIL,ZESTRIL) 10 MG tablet Take 10 mg by mouth daily.   Yes Historical Provider, MD  PARoxetine (PAXIL) 20 MG tablet Take 20 mg by mouth every evening.     Yes Historical Provider, MD  polycarbophil (FIBERCON) 625 MG tablet Take 625 mg by mouth daily.     Yes Historical Provider, MD  rosuvastatin (CRESTOR) 5 MG tablet Take 5 mg by mouth every morning.    Yes Historical Provider, MD  aspirin EC 325 MG tablet Take 1 tablet (325 mg total) by mouth daily. 09/23/13   M. Doran Stabler, PA-C  bisacodyl (DULCOLAX) 5 MG EC tablet Take 1 tablet (5 mg total) by mouth daily as needed for moderate constipation. 09/23/13   M. Doran Stabler, PA-C  methocarbamol (ROBAXIN) 500 MG tablet Take 1 tablet (500 mg total) by mouth 4 (four) times daily. 09/23/13   M. Doran Stabler, PA-C  ondansetron (ZOFRAN) 4 MG tablet Take 1 tablet (4 mg total) by mouth every 8 (eight) hours as needed for nausea or vomiting. 09/23/13   M. Doran Stabler, PA-C  oxyCODONE-acetaminophen (ROXICET) 5-325 MG per tablet Take 1-2 tablets by mouth every 4 (four) hours as needed. 09/23/13   M. Doran Stabler, PA-C   Physical Exam: Blood pressure 125/63, pulse 96, temperature 97.4 F (36.3 C), temperature source Oral, resp. rate 18, height 5\' 9"  (1.753 m), weight 91.173  kg (201 lb), SpO2 94.00%. Filed Vitals:   09/25/13 0750  BP:   Pulse:   Temp:   Resp: 18     General:  A+OX,3, no tremors  Eyes: wnl  ENT: wnl  Neck: supple  Cardiovascular: rrr  Respiratory: clear, no wheezing  Abdomen: +BS, soft  Skin: no rashes or lesions  Musculoskeletal: moves all 4 ext, left knee wrapped  Psychiatric: slow to respond to some answers  Neurologic: no focal deficit  Labs on Admission:  Basic Metabolic Panel:  Recent Labs Lab 09/24/13 0603 09/25/13 0713  NA 137 134*  K 5.5* 5.0  CL 100 99  CO2 21 24  GLUCOSE 163* 152*  BUN 16 35*  CREATININE 1.10 1.30  CALCIUM 8.7 8.9   Liver Function Tests: No results found for this basename:  AST, ALT, ALKPHOS, BILITOT, PROT, ALBUMIN,  in the last 168 hours No results found for this basename: LIPASE, AMYLASE,  in the last 168 hours No results found for this basename: AMMONIA,  in the last 168 hours CBC:  Recent Labs Lab 09/24/13 0603 09/25/13 0713  WBC 12.9* 19.8*  HGB 12.0* 10.9*  HCT 36.3* 33.0*  MCV 96.0 96.5  PLT 191 191   Cardiac Enzymes: No results found for this basename: CKTOTAL, CKMB, CKMBINDEX, TROPONINI,  in the last 168 hours BNP: No components found with this basename: POCBNP,  CBG: No results found for this basename: GLUCAP,  in the last 168 hours  Radiological Exams on Admission: Dg Chest Port 1 View  09/23/2013   CLINICAL DATA:  Breathing difficulty is; postoperative from right knee replacement  EXAM: PORTABLE CHEST - 1 VIEW  COMPARISON:  PA and lateral chest of April 03, 2013  FINDINGS: The lungs are well-expanded. The interstitial markings are slightly more conspicuous than on the earlier study. The cardiac silhouette is top-normal in size. The pulmonary vascularity is not engorged. There is no pleural effusion or pneumothorax.  IMPRESSION: Mildly increased prominence of the pulmonary interstitium may reflect subsegmental atelectasis or mild edema. There is no focal  pneumonia nor pulmonary vascular congestion.   Electronically Signed   By: [name]   On: 09/23/2013 15:00   Dg Knee Right Port  09/23/2013   CLINICAL DATA:  Right knee replacement.  EXAM: PORTABLE RIGHT KNEE - 1-2 VIEW  COMPARISON:  MRI right knee 06/30/2013.  FINDINGS: The patient has a new right total knee arthroplasty. The device is located and no fracture is identified. Surgical drain is noted. There is some gas in the joint from surgery.  IMPRESSION: Right total knee replacement without complication.   Electronically Signed   By: [name] M.D.   On: 09/23/2013 15:01    EKG: Independently reviewed. Sinus with PVC  Time spent: 75 min  Evani Shrider [facility] Pager [phone]  If 7PM-7AM, please contact night-coverage [email or web address] Password [email or web address] 09/25/2013, 10:59 AM

## 2013-09-25 NOTE — Progress Notes (Addendum)
Subjective: 2 Days Post-Op Procedure(s) (LRB): TOTAL KNEE ARTHROPLASTY (Right) Patient reports pain as 1 on 0-10 scale.  Patient in minimal pain.  He is alert and oriented to person only.  No lightheadedness/dizziness, nausea/vomiting.    Objective: Vital signs in last 24 hours: Temp:  [97.4 F (36.3 C)-97.8 F (36.6 C)] 97.4 F (36.3 C) (07/17 0545) Pulse Rate:  [83-96] 96 (07/17 0545) Resp:  [14-18] 18 (07/17 0750) BP: (94-125)/(63-76) 125/63 mmHg (07/17 0545) SpO2:  [89 %-94 %] 94 % (07/17 0545)  Intake/Output from previous day: 07/16 0701 - 07/17 0700 In: 480 [P.O.:480] Out: -  Intake/Output this shift: Total I/O In: 120 [P.O.:120] Out: -    Recent Labs  09/24/13 0603  HGB 12.0*    Recent Labs  09/24/13 0603  WBC 12.9*  RBC 3.78*  HCT 36.3*  PLT 191    Recent Labs  09/24/13 0603  NA 137  K 5.5*  CL 100  CO2 21  BUN 16  CREATININE 1.10  GLUCOSE 163*  CALCIUM 8.7   No results found for this basename: LABPT, INR,  in the last 72 hours  Neurologically intact Neurovascular intact Sensation intact distally Intact pulses distally Dorsiflexion/Plantar flexion intact Incision: scant drainage No cellulitis present Compartment soft Dressing changed by me today  Assessment/Plan: 2 Days Post-Op Procedure(s) (LRB): TOTAL KNEE ARTHROPLASTY (Right) Advance diet Up with therapy Discharge to SNF most likely on Saturday but this is pending hospitalist evaluation/recommendation WBAT RLE Ordered CIWA and urinalysis Patient's disorientation is likely due to anesthesia complications but due to the fact that Rylie remains somewhat disoriented and has a hx of stroke, I am now going to consult hospitalist for further evaluation and recommendation  ANTON, M. LINDSEY 09/25/2013, 8:01 AM

## 2013-09-25 NOTE — Progress Notes (Addendum)
Clinical Social Work Department CLINICAL SOCIAL WORK PLACEMENT NOTE 09/25/2013  Patient:  George Vega, George Vega  Account Number:  [id number] Admit date:  09/23/2013  Clinical Social Worker:  Berton Mount, Latanya Presser  Date/time:  09/25/2013 12:00 N  Clinical Social Work is seeking post-discharge placement for this patient at the following level of care:   [place]   (*CSW will update this form in Epic as items are completed)   09/25/2013  Patient/family provided with [facility] Department of Clinical Social Work's list of facilities offering this level of care within the geographic area requested by the patient (or if unable, by the patient's family).  09/25/2013  Patient/family informed of their freedom to choose among providers that offer the needed level of care, that participate in Medicare, Medicaid or managed care program needed by the patient, have an available bed and are willing to accept the patient.  09/25/2013  Patient/family informed of [facility]' ownership interest in [facility], as well as of the fact that they are under no obligation to receive care at this facility.  PASARR submitted to EDS on 09/25/2013 PASARR number received on 09/25/2013  FL2 transmitted to all facilities in geographic area requested by pt/family on  09/25/2013 FL2 transmitted to all facilities within larger geographic area on   Patient informed that his/her managed care company has contracts with or will negotiate with  certain facilities, including the following:     Patient/family informed of bed offers received:  09/25/2013 Patient chooses bed at [facility] Physician recommends and patient chooses bed at    Patient to be transferred to [facility] on 09/27/13- Blima Rich, [place]   Patient to be transferred to facility by Alex, [place]  Patient and family notified of transfer on 09/27/13- Blima Rich, [place]  Name of family member notified: Wife Pat at bedside-  Blima Rich, [facility]    The following physician request were entered in Epic: Physician Request  Please sign FL2.    Additional CommentsBerton Mount, [place]

## 2013-09-25 NOTE — Progress Notes (Signed)
Physical Therapy Treatment Patient Details Name: George Vega MRN: [id number] DOB: 12/31/1940 Today's Date: 09/25/2013    History of Present Illness s/p Rt TKA     PT Comments    Pt was still very confused and disoriented today. Pt presented with increased coughing. RN made aware. Pt was having a difficult time listening and following instructions. Pt was having trouble amb safely so he was not able to amb as far today. Pt was monitored with pulse ox during exercises and placed by on O2 at end of session. Continue to recommend SNF for ongoing Physical Therapy.     Follow Up Recommendations  SNF     Equipment Recommendations  Rolling walker with 5" wheels;3in1 (PT)    Recommendations for Other Services       Precautions / Restrictions Precautions Precautions: Knee Other Brace/Splint: footsie roll Restrictions Weight Bearing Restrictions: Yes RLE Weight Bearing: Weight bearing as tolerated    Mobility  Bed Mobility Overal bed mobility: Needs Assistance Bed Mobility: Supine to Sit     Supine to sit: Supervision     General bed mobility comments: pt did much better getting to edge of bed on his own. needs supervision for safety and cues to take his time and not rush.   Transfers Overall transfer level: Needs assistance Equipment used: Rolling walker (2 wheeled) Transfers: Sit to/from Stand Sit to Stand: Min guard         General transfer comment: min guard for safety and to help pt with balance when first coming to standing. verbal cues for hand placements.   Ambulation/Gait Ambulation/Gait assistance: Min assist Ambulation Distance (Feet): 40 Feet Assistive device: Rolling walker (2 wheeled) Gait Pattern/deviations: Step-to pattern;Decreased stride length;Narrow base of support;Trunk flexed;Drifts right/left Gait velocity: decreased Gait velocity interpretation: Below normal speed for age/gender General Gait Details: Pt was drifting to left while amb and  leaning to the right. Pt was getting too ahead of himself with walker due to lack of attention to ambulating. pt was not remembering verbal cues for RW.    Stairs            Wheelchair Mobility    Modified Rankin (Stroke Patients Only)       Balance                                    Cognition Arousal/Alertness: Awake/alert Behavior During Therapy: WFL for tasks assessed/performed Overall Cognitive Status: Within Functional Limits for tasks assessed Area of Impairment: Orientation;Attention;Memory;Following commands;Safety/judgement Orientation Level: Disoriented to;Situation   Memory: Decreased short-term memory;Decreased recall of precautions         General Comments: Pt needs consistent cues when ambulating and when exercising. Pt kept forgetting activities he was performing.    Exercises Total Joint Exercises Ankle Circles/Pumps: AROM;20 reps;Seated Quad Sets: AROM;Right;Seated;10 reps Heel Slides: AAROM;Seated;Right;10 reps Hip ABduction/ADduction: AAROM;Seated;Right;10 reps Straight Leg Raises: AAROM;Right;Seated;10 reps Long Arc Quad: AAROM;Right;Seated;10 reps    General Comments        Pertinent Vitals/Pain no apparent distress. Pt repositioned in recliner for comfort with footsie roll.      Home Living                      Prior Function            PT Goals (current goals can now be found in the care plan section) Progress towards PT goals: Progressing toward  goals    Frequency  7X/week    PT Plan Discharge plan needs to be updated    Co-evaluation             End of Session Equipment Utilized During Treatment: Gait belt Activity Tolerance: Patient tolerated treatment well Patient left: in chair;with call bell/phone within reach     Time: 0748-0828 PT Time Calculation (min): 40 min  Charges:                       G Codes:      BRASFIELD,Tierre Gerard,SPTA 09/25/2013, 10:04 AM

## 2013-09-26 DIAGNOSIS — F10931 Alcohol use, unspecified with withdrawal delirium: Secondary | ICD-10-CM

## 2013-09-26 DIAGNOSIS — M1710 Unilateral primary osteoarthritis, unspecified knee: Secondary | ICD-10-CM

## 2013-09-26 DIAGNOSIS — F10231 Alcohol dependence with withdrawal delirium: Secondary | ICD-10-CM

## 2013-09-26 LAB — BASIC METABOLIC PANEL
ANION GAP: 11 (ref 5–15)
BUN: 28 mg/dL — ABNORMAL HIGH (ref 6–23)
CHLORIDE: 102 meq/L (ref 96–112)
CO2: 25 mEq/L (ref 19–32)
Calcium: 8.2 mg/dL — ABNORMAL LOW (ref 8.4–10.5)
Creatinine, Ser: 0.98 mg/dL (ref 0.50–1.35)
GFR calc Af Amer: >90 mL/min (ref >90)
GFR calc non Af Amer: 80 mL/min — ABNORMAL LOW (ref >90)
Glucose, Bld: 111 mg/dL — ABNORMAL HIGH (ref 70–99)
Potassium: 4.2 mEq/L (ref 3.7–5.3)
SODIUM: 138 meq/L (ref 137–147)

## 2013-09-26 LAB — CBC WITH DIFFERENTIAL/PLATELET
BASOS PCT: 0 % (ref 0–1)
Basophils Absolute: 0 10*3/uL (ref 0.0–0.1)
Eosinophils Absolute: 0.1 10*3/uL (ref 0.0–0.7)
Eosinophils Relative: 1 % (ref 0–5)
HCT: 29.5 % — ABNORMAL LOW (ref 39.0–52.0)
HEMOGLOBIN: 9.9 g/dL — AB (ref 13.0–17.0)
LYMPHS ABS: 1.6 10*3/uL (ref 0.7–4.0)
Lymphocytes Relative: 12 % (ref 12–46)
MCH: 32.1 pg (ref 26.0–34.0)
MCHC: 33.6 g/dL (ref 30.0–36.0)
MCV: 95.8 fL (ref 78.0–100.0)
MONOS PCT: 8 % (ref 3–12)
Monocytes Absolute: 1 10*3/uL (ref 0.1–1.0)
NEUTROS ABS: 10.5 10*3/uL — AB (ref 1.7–7.7)
Neutrophils Relative %: 79 % — ABNORMAL HIGH (ref 43–77)
PLATELETS: 170 10*3/uL (ref 150–400)
RBC: 3.08 MIL/uL — ABNORMAL LOW (ref 4.22–5.81)
RDW: 13.8 % (ref 11.5–15.5)
WBC: 13.2 10*3/uL — ABNORMAL HIGH (ref 4.0–10.5)

## 2013-09-26 MED ORDER — CHLORDIAZEPOXIDE HCL 5 MG PO CAPS
5.0000 mg | ORAL_CAPSULE | Freq: Three times a day (TID) | ORAL | Status: DC
  Administered 2013-09-26 (×3): 5 mg via ORAL
  Filled 2013-09-26 (×3): qty 1

## 2013-09-26 NOTE — Progress Notes (Signed)
Subjective: 3 Days Post-Op Procedure(s) (LRB): TOTAL KNEE ARTHROPLASTY (Right) Patient reports pain as 1 on 0-10 scale.  Patient in minimal pain.  He is alert and oriented to person only.  No lightheadedness/dizziness, nausea/vomiting.    Objective: Vital signs in last 24 hours: Temp:  [97.6 F (36.4 C)-98.1 F (36.7 C)] 98.1 F (36.7 C) (07/18 0508) Pulse Rate:  [82-98] 82 (07/18 0820) Resp:  [16-19] 16 (07/18 0820) BP: (122-152)/(69-98) 140/84 mmHg (07/18 0820) SpO2:  [94 %-97 %] 97 % (07/18 0820)  Intake/Output from previous day: 07/17 0701 - 07/18 0700 In: 2256.3 [P.O.:1300; I.V.:956.3] Out: 800 [Urine:800] Intake/Output this shift: Total I/O In: -  Out: 200 [Urine:200]   Recent Labs  09/24/13 0603 09/25/13 0713 09/26/13 0459  HGB 12.0* 10.9* 9.9*    Recent Labs  09/25/13 0713 09/26/13 0459  WBC 19.8* 13.2*  RBC 3.42* 3.08*  HCT 33.0* 29.5*  PLT 191 170    Recent Labs  09/25/13 0713 09/26/13 0459  NA 134* 138  K 5.0 4.2  CL 99 102  CO2 24 25  BUN 35* 28*  CREATININE 1.30 0.98  GLUCOSE 152* 111*  CALCIUM 8.9 8.2*   No results found for this basename: LABPT, INR,  in the last 72 hours  Neurologically intact Neurovascular intact Sensation intact distally Intact pulses distally Dorsiflexion/Plantar flexion intact Incision: scant drainage No cellulitis present Compartment soft Dressing changed by me today  Assessment/Plan: 3 Days Post-Op Procedure(s) (LRB): TOTAL KNEE ARTHROPLASTY (Right) D/c pending AMS work up will likely keep him for observation until Monday  WBAT RLE, cont PT On CIWA IM team involved, we appreciate the help of Dr. Ilda Foil, Nura Cahoon, D 09/26/2013, 10:55 AM

## 2013-09-26 NOTE — Progress Notes (Signed)
Physical Therapy Treatment Patient Details Name: George Vega MRN: [id number] DOB: 10-04-1940 Today's Date: 09/26/2013    History of Present Illness s/p Rt TKA     PT Comments    Patient continues to be limited by decreased safety awareness and decreased cognition. Continue to recommend SNF for ongoing Physical Therapy.     Follow Up Recommendations  SNF     Equipment Recommendations  Rolling walker with 5" wheels;3in1 (PT)    Recommendations for Other Services       Precautions / Restrictions Precautions Precautions: Knee Restrictions Weight Bearing Restrictions: Yes RLE Weight Bearing: Weight bearing as tolerated    Mobility  Bed Mobility Overal bed mobility: Needs Assistance Bed Mobility: Supine to Sit     Supine to sit: Supervision     General bed mobility comments: Supervision for safety  Transfers Overall transfer level: Needs assistance Equipment used: Rolling walker (2 wheeled)   Sit to Stand: Min guard         General transfer comment: min guard for safety and to help pt with balance when first coming to standing. verbal cues for hand placements.   Ambulation/Gait Ambulation/Gait assistance: Min assist Ambulation Distance (Feet): 60 Feet Assistive device: Rolling walker (2 wheeled) Gait Pattern/deviations: Step-through pattern;Decreased stride length;Trunk flexed Gait velocity: decreased   General Gait Details: Patient needed constant cues to increase step length and to stay within RW. Min A to position RW and to ensure safety with positioning. Max cues required for safety   Stairs            Wheelchair Mobility    Modified Rankin (Stroke Patients Only)       Balance                                    Cognition Arousal/Alertness: Awake/alert Behavior During Therapy: WFL for tasks assessed/performed Overall Cognitive Status: Impaired/Different from baseline Area of Impairment: Orientation;Memory;Following  commands;Safety/judgement;Awareness Orientation Level: Disoriented to;Time;Situation;Place   Memory: Decreased short-term memory;Decreased recall of precautions              Exercises Total Joint Exercises Quad Sets: AROM;Right;Seated;10 reps Heel Slides: AAROM;Seated;Right;10 reps Hip ABduction/ADduction: AAROM;Seated;Right;10 reps Straight Leg Raises: AAROM;Right;Seated;10 reps Long Arc Quad: AAROM;Right;Seated;10 reps    General Comments        Pertinent Vitals/Pain no apparent distress     Home Living                      Prior Function            PT Goals (current goals can now be found in the care plan section) Progress towards PT goals: Progressing toward goals    Frequency  7X/week    PT Plan Current plan remains appropriate    Co-evaluation             End of Session Equipment Utilized During Treatment: Gait belt Activity Tolerance: Patient tolerated treatment well Patient left: in chair;with call bell/phone within reach;with chair alarm set     Time: 0732-0758 PT Time Calculation (min): 26 min  Charges:  $Gait Training: 8-22 mins $Therapeutic Exercise: 8-22 mins                    G Codes:      Jacqualyn Posey 09/26/2013, 11:12 AM 09/26/2013 Jacqualyn Posey PTA [phone] pager [phone] office

## 2013-09-26 NOTE — Progress Notes (Addendum)
Consult note                                            Patient Demographics  George Vega, is a 73 y.o. male, DOB - 10-21-1940, NUU:[id number]  Admit date - 09/23/2013   Admitting Physician Ninetta Lights, MD  Outpatient Primary MD for the patient is [name], MD  LOS - 3   No chief complaint on file.          Subjective:   [name] today has, No headache, No chest pain, No abdominal pain - No Nausea, No new weakness tingling or numbness, No Cough - SOB.    Assessment & Plan    1. 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 Will place him on scheduled Librium, continue folic acid thymine along with IV Ativan per CIWA protocol. Fall and aspiration precautions. Last drink about 4-5 days ago.   2. CAD. On comminution of aspirin, Plavix, Coreg and statin for secondary prevention. No acute issues.   3. Dyslipidemia. Continue statin at home dose.   4. History of depression. Not suicidal homicidal on Paxil continue unchanged.   5.HTN - stable control on beta blocker.   6. Mild reactive leukocytosis (postop). Afebrile, UA chest x-ray stable. Monitor.   7. Right total knee replacement. We'll defer to primary team which is orthopedics.      Code Status: Full  Family Communication: Daughter bedside    Medications  Scheduled Meds: . aspirin EC  325 mg Oral Q breakfast  . atorvastatin  10 mg Oral q1800  . carvedilol  6.25 mg Oral BID WC  . chlordiazePOXIDE  5 mg Oral TID  . clopidogrel  75 mg Oral QHS  . docusate sodium  100 mg Oral BID  . folic acid  1 mg Oral Daily  . multivitamin with minerals  1 tablet Oral Daily  . PARoxetine  20 mg Oral QPM  . polycarbophil  625 mg Oral Daily  . thiamine  100 mg Oral Daily   Continuous Infusions:  PRN  Meds:.acetaminophen, alum & mag hydroxide-simeth, bisacodyl, LORazepam, LORazepam, ondansetron (ZOFRAN) IV, oxyCODONE  DVT Prophylaxis  aspirin per primary team which is orthopedics  Lab Results  Component Value Date   PLT 170 09/26/2013    Antibiotics    Anti-infectives   Start     Dose/Rate Route Frequency Ordered Stop   09/23/13 1800  ceFAZolin (ANCEF) IVPB 2 g/50 mL premix     2 g 100 mL/hr over 30 Minutes Intravenous Every 6 hours 09/23/13 1535 09/24/13 0102   09/23/13 0600  ceFAZolin (ANCEF) IVPB 2 g/50 mL premix     2 g 100 mL/hr over 30 Minutes Intravenous On call to O.R. 09/22/13 1459 09/23/13 1152          Objective:   Filed Vitals:   09/26/13 0000 09/26/13 0400 09/26/13 0508 09/26/13 0820  BP:   152/90 140/84  Pulse:   98 82  Temp:   98.1 F (36.7 C)   TempSrc:   Oral   Resp: 19 16 18 16   Height:      Weight:      SpO2:   94% 97%    Wt Readings from Last 3 Encounters:  09/23/13 91.173 kg (201 lb)  09/23/13 91.173 kg (201 lb)  09/15/13 91.4 kg (  201 lb 8 oz)     Intake/Output Summary (Last 24 hours) at 09/26/13 0947 Last data filed at 09/26/13 8099  Gross per 24 hour  Intake 2016.25 ml  Output   1000 ml  Net 1016.25 ml     Physical Exam  Awake,Oriented X 2, No new F.N deficits, Normal affect [place].AT,PERRAL Supple Neck,No JVD, No cervical lymphadenopathy appriciated.  Symmetrical Chest wall movement, Good air movement bilaterally, CTAB RRR,No Gallops,Rubs or new Murmurs, No Parasternal Heave +ve B.Sounds, Abd Soft, No tenderness, No organomegaly appriciated, No rebound - guarding or rigidity. No Cyanosis, Clubbing or edema, No new Rash or bruise     Data Review   Micro Results No results found for this or any previous visit (from the past 240 hour(s)).  Radiology Reports Dg Chest Port 1 View  09/25/2013   CLINICAL DATA:  73 year old male postoperative Cough and congestion. Initial encounter.  EXAM: PORTABLE CHEST - 1 VIEW  COMPARISON:   09/23/2013 and earlier.  FINDINGS: Portable AP upright view at 1110 hrs. Stable lung volumes. Stable cardiac size and mediastinal contours. Visualized tracheal air column is within normal limits. No pneumothorax or pleural effusion. Chronic increased interstitial markings which appear to be chronic in light of the 04/03/2013 comparison. Arthroplasty hardware about both shoulders.  IMPRESSION: Stable increased interstitial markings, favor chronic. No acute cardiopulmonary abnormality.   Electronically Signed   By: [name] M.D.   On: 09/25/2013 11:25   Dg Chest Port 1 View  09/23/2013   CLINICAL DATA:  Breathing difficulty is; postoperative from right knee replacement  EXAM: PORTABLE CHEST - 1 VIEW  COMPARISON:  PA and lateral chest of April 03, 2013  FINDINGS: The lungs are well-expanded. The interstitial markings are slightly more conspicuous than on the earlier study. The cardiac silhouette is top-normal in size. The pulmonary vascularity is not engorged. There is no pleural effusion or pneumothorax.  IMPRESSION: Mildly increased prominence of the pulmonary interstitium may reflect subsegmental atelectasis or mild edema. There is no focal pneumonia nor pulmonary vascular congestion.   Electronically Signed   By: [name]   On: 09/23/2013 15:00   Dg Knee Right Port  09/23/2013   CLINICAL DATA:  Right knee replacement.  EXAM: PORTABLE RIGHT KNEE - 1-2 VIEW  COMPARISON:  MRI right knee 06/30/2013.  FINDINGS: The patient has a new right total knee arthroplasty. The device is located and no fracture is identified. Surgical drain is noted. There is some gas in the joint from surgery.  IMPRESSION: Right total knee replacement without complication.   Electronically Signed   By: [name] M.D.   On: 09/23/2013 15:01    CBC  Recent Labs Lab 09/24/13 0603 09/25/13 0713 09/26/13 0459  WBC 12.9* 19.8* 13.2*  HGB 12.0* 10.9* 9.9*  HCT 36.3* 33.0* 29.5*  PLT 191 191 170  MCV 96.0 96.5 95.8    MCH 31.7 31.9 32.1  MCHC 33.1 33.0 33.6  RDW 13.4 13.7 13.8  LYMPHSABS  --   --  1.6  MONOABS  --   --  1.0  EOSABS  --   --  0.1  BASOSABS  --   --  0.0    Chemistries   Recent Labs Lab 09/24/13 0603 09/25/13 0713 09/26/13 0459  NA 137 134* 138  K 5.5* 5.0 4.2  CL 100 99 102  CO2 21 24 25   GLUCOSE 163* 152* 111*  BUN 16 35* 28*  CREATININE 1.10 1.30 0.98  CALCIUM 8.7 8.9  8.2*   ------------------------------------------------------------------------------------------------------------------ estimated creatinine clearance is 76 ml/min (by C-G formula based on Cr of 0.98). ------------------------------------------------------------------------------------------------------------------ No results found for this basename: HGBA1C,  in the last 72 hours ------------------------------------------------------------------------------------------------------------------ No results found for this basename: CHOL, HDL, LDLCALC, TRIG, CHOLHDL, LDLDIRECT,  in the last 72 hours ------------------------------------------------------------------------------------------------------------------ No results found for this basename: TSH, T4TOTAL, FREET3, T3FREE, THYROIDAB,  in the last 72 hours ------------------------------------------------------------------------------------------------------------------ No results found for this basename: VITAMINB12, FOLATE, FERRITIN, TIBC, IRON, RETICCTPCT,  in the last 72 hours  Coagulation profile No results found for this basename: INR, PROTIME,  in the last 168 hours  No results found for this basename: DDIMER,  in the last 72 hours  Cardiac Enzymes No results found for this basename: CK, CKMB, TROPONINI, MYOGLOBIN,  in the last 168 hours ------------------------------------------------------------------------------------------------------------------ No components found with this basename: POCBNP,      Time Spent in minutes   35   SINGH,PRASHANT K M.D on 09/26/2013 at 9:47 AM  Between 7am to 7pm - Pager - [phone]  After 7pm go to [email or web address] - password [email or web address]  And look for the night coverage person covering for me after hours  [facility] Office  [phone]   **Disclaimer: This note may have been dictated with voice recognition software. Similar sounding words can inadvertently be transcribed and this note may contain transcription errors which may not have been corrected upon publication of note.**

## 2013-09-26 NOTE — Progress Notes (Signed)
Occupational Therapy Treatment Patient Details Name: George Vega MRN: [id number] DOB: [birth year] Today's Date: 09/26/2013    History of present illness s/p Rt TKA    OT comments  Agreeable to participation in skilled OT.   Requiring max instructional and tactile cues for walker safety.  Wife present and states SNF is still the plan.    Follow Up Recommendations  SNF    Equipment Recommendations  3 in 1 bedside comode          Precautions / Restrictions Precautions Precautions: Knee Restrictions Weight Bearing Restrictions: Yes RLE Weight Bearing: Weight bearing as tolerated       Mobility Bed Mobility Overal bed mobility: Needs Assistance Bed Mobility: Supine to Sit     Supine to sit: Min guard     General bed mobility comments: cues for safety when bringing b les out of bed  Transfers Overall transfer level: Needs assistance Equipment used: Rolling walker (2 wheeled) Transfers: Sit to/from Stand Sit to Stand: Min assist;Mod assist Stand pivot transfers: Mod assist;Min assist       General transfer comment: min/mod a for safety to bring pt. into standing to maintain balance.  cues for walker management during ambulation, no evidence of carry over throughout session.                                                                  Toilet Transfer: Minimal assistance;Moderate assistance;Ambulation Toilet Transfer Details (indicate cue type and reason): simulated amb. from around bed to [place] and Hygiene: Minimal assistance;Sit to/from stand;Moderate assistance Toileting - Clothing Manipulation Details (indicate cue type and reason): simulated during session     Functional mobility during ADLs: Minimal assistance;Moderate assistance General ADL Comments: pt. still presenting with confusion and delayed response to questions, compensating with joking.  max tactie and instructional cues during  ambulation                                      Cognition   Behavior During Therapy: WFL for tasks assessed/performed Overall Cognitive Status: Impaired/Different from baseline Area of Impairment: Orientation;Memory;Following commands;Safety/judgement;Awareness Orientation Level: Disoriented to;Time;Situation;Place   Memory: Decreased short-term memory;Decreased recall of precautions                              Exercises Total Joint Exercises Quad Sets: AROM;Right;Seated;10 reps Heel Slides: AAROM;Seated;Right;10 reps Hip ABduction/ADduction: AAROM;Seated;Right;10 reps Straight Leg Raises: AAROM;Right;Seated;10 reps Long Arc Quad: AAROM;Right;Seated;10 reps                Pertinent Vitals/ Pain      No c/o pain                                                             Progress Toward Goals  OT Goals(current goals can now be found in the care plan section)  Progress towards OT goals: Progressing toward goals     Plan  Discharge plan remains appropriate                     End of Session Equipment Utilized During Treatment: Rolling walker   Activity Tolerance Patient tolerated treatment well   Patient Left in chair;with call bell/phone within reach;with chair alarm set             Time: 1030-1110 OT Time Calculation (min): 40 min  Charges: OT General Charges $OT Visit: 1 Procedure OT Treatments $Self Care/Home Management : 38-52 mins  Janice Coffin, COTA/L 09/26/2013, 12:01 PM

## 2013-09-27 MED ORDER — DEXTROSE-NACL 5-0.45 % IV SOLN
INTRAVENOUS | Status: DC
Start: 2013-09-27 — End: 2013-09-27

## 2013-09-27 MED ORDER — CHLORDIAZEPOXIDE HCL 5 MG PO CAPS
5.0000 mg | ORAL_CAPSULE | Freq: Two times a day (BID) | ORAL | Status: DC
  Administered 2013-09-27: 5 mg via ORAL
  Filled 2013-09-27: qty 1

## 2013-09-27 MED ORDER — DEXTROSE 5 % AND 0.45 % NACL IV BOLUS
1000.0000 mL | Freq: Once | INTRAVENOUS | Status: AC
  Administered 2013-09-27: 1000 mL via INTRAVENOUS

## 2013-09-27 NOTE — Progress Notes (Signed)
Called [employer] service at this for transport to [facility].

## 2013-09-27 NOTE — Progress Notes (Signed)
Dr. Percell Miller aware of pt's wife deciding for pt to be discharged today after the bolus of IV fluids.  No new orders at this time.

## 2013-09-27 NOTE — Progress Notes (Signed)
Dr. Percell Miller aware of pt's wife not feeling comfortable with pt being discharged to home today.  Pt's wife feels as if pt needs to progress with therapy a little more.  Pt to be discharged to home or [facility] tomorrow, Monday 7/20.  Also, pt's wife requesting pt to have IV and fluids started d/t feeling as if pt is dehydrated.  Dr. Percell Miller aware.  Orders received and carried out.

## 2013-09-27 NOTE — Progress Notes (Signed)
PT Cancellation Note  Patient Details Name: George Vega MRN: [id number] DOB: [birth year]   Cancelled Treatment:     Spoke with Mel Almond, social worker, who reports pt will be d/c'd to [facility] today.      Sarajane Marek, [place] [phone] 09/27/2013

## 2013-09-27 NOTE — Discharge Summary (Signed)
Physician Discharge Summary  Patient ID: George Vega MRN: [id number] DOB/AGE: Mar 20, 1940 73 y.o.  Admit date: 09/23/2013 Discharge date: 09/27/2013  Admission Diagnoses:  <principal problem not specified>  Discharge Diagnoses:  Active Problems:   DEPRESSION   CAD   HTN (hypertension)   DJD (degenerative joint disease) of knee   DTs (delirium tremens)   Past Medical History  Diagnosis Date  . Arthritis   . Spastic colon   . Chronic sinusitis   . Coronary artery disease   . Hypertension   . Depression   . Difficult intubation     was told with shoulder done 2006-alittle narrow  . Jejunostomy tube fell out   . Fall against sharp object     stiches in head    Surgeries: Procedure(s): TOTAL KNEE ARTHROPLASTY on 09/23/2013   Consultants (if any):    Discharged Condition: Improved  Hospital Course: George Vega is an 73 y.o. male who was admitted 09/23/2013 with a diagnosis of <principal problem not specified> and went to the operating room on 09/23/2013 and underwent the above named procedures.    He suffered some AMS postoperatively but this seems to have resolved, He has been A&o x3 on Saturday and Sunday morning, no reported AMS by overnight nurse.   He was given perioperative antibiotics:  Anti-infectives   Start     Dose/Rate Route Frequency Ordered Stop   09/23/13 1800  ceFAZolin (ANCEF) IVPB 2 g/50 mL premix     2 g 100 mL/hr over 30 Minutes Intravenous Every 6 hours 09/23/13 1535 09/24/13 0102   09/23/13 0600  ceFAZolin (ANCEF) IVPB 2 g/50 mL premix     2 g 100 mL/hr over 30 Minutes Intravenous On call to O.R. 09/22/13 1459 09/23/13 1152    .  He was given sequential compression devices, early ambulation, and ASA 325 for DVT prophylaxis.  He benefited maximally from the hospital stay and there were no complications.    Recent vital signs:  Filed Vitals:   09/27/13 0538  BP: 152/81  Pulse: 98  Temp: 97.8 F (36.6 C)  Resp: 16    Recent  laboratory studies:  Lab Results  Component Value Date   HGB 9.9* 09/26/2013   HGB 10.9* 09/25/2013   HGB 12.0* 09/24/2013   Lab Results  Component Value Date   WBC 13.2* 09/26/2013   PLT 170 09/26/2013   Lab Results  Component Value Date   INR 0.96 09/15/2013   Lab Results  Component Value Date   NA 138 09/26/2013   K 4.2 09/26/2013   CL 102 09/26/2013   CO2 25 09/26/2013   BUN 28* 09/26/2013   CREATININE 0.98 09/26/2013   GLUCOSE 111* 09/26/2013    Discharge Medications:     Medication List    STOP taking these medications       aspirin 81 MG tablet  Replaced by:  aspirin EC 325 MG tablet      TAKE these medications       acetaminophen 325 MG tablet  Commonly known as:  TYLENOL  Take 650 mg by mouth every 6 (six) hours as needed for moderate pain.     aspirin EC 325 MG tablet  Take 1 tablet (325 mg total) by mouth daily.     bisacodyl 5 MG EC tablet  Commonly known as:  DULCOLAX  Take 1 tablet (5 mg total) by mouth daily as needed for moderate constipation.     carvedilol 6.25 MG tablet  Commonly  known as:  COREG  Take 6.25 mg by mouth 2 (two) times daily with a meal.     clopidogrel 75 MG tablet  Commonly known as:  PLAVIX  Take 75 mg by mouth at bedtime.     lisinopril 10 MG tablet  Commonly known as:  PRINIVIL,ZESTRIL  Take 10 mg by mouth daily.     methocarbamol 500 MG tablet  Commonly known as:  ROBAXIN  Take 1 tablet (500 mg total) by mouth 4 (four) times daily.     ondansetron 4 MG tablet  Commonly known as:  ZOFRAN  Take 1 tablet (4 mg total) by mouth every 8 (eight) hours as needed for nausea or vomiting.     oxyCODONE-acetaminophen 5-325 MG per tablet  Commonly known as:  ROXICET  Take 1-2 tablets by mouth every 4 (four) hours as needed.     PARoxetine 20 MG tablet  Commonly known as:  PAXIL  Take 20 mg by mouth every evening.     polycarbophil 625 MG tablet  Commonly known as:  FIBERCON  Take 625 mg by mouth daily.     rosuvastatin 5 MG  tablet  Commonly known as:  CRESTOR  Take 5 mg by mouth every morning.        Diagnostic Studies: Dg Chest Port 1 View  09/25/2013   CLINICAL DATA:  73 year old male postoperative Cough and congestion. Initial encounter.  EXAM: PORTABLE CHEST - 1 VIEW  COMPARISON:  09/23/2013 and earlier.  FINDINGS: Portable AP upright view at 1110 hrs. Stable lung volumes. Stable cardiac size and mediastinal contours. Visualized tracheal air column is within normal limits. No pneumothorax or pleural effusion. Chronic increased interstitial markings which appear to be chronic in light of the 04/03/2013 comparison. Arthroplasty hardware about both shoulders.  IMPRESSION: Stable increased interstitial markings, favor chronic. No acute cardiopulmonary abnormality.   Electronically Signed   By: [name] M.D.   On: 09/25/2013 11:25   Dg Chest Port 1 View  09/23/2013   CLINICAL DATA:  Breathing difficulty is; postoperative from right knee replacement  EXAM: PORTABLE CHEST - 1 VIEW  COMPARISON:  PA and lateral chest of April 03, 2013  FINDINGS: The lungs are well-expanded. The interstitial markings are slightly more conspicuous than on the earlier study. The cardiac silhouette is top-normal in size. The pulmonary vascularity is not engorged. There is no pleural effusion or pneumothorax.  IMPRESSION: Mildly increased prominence of the pulmonary interstitium may reflect subsegmental atelectasis or mild edema. There is no focal pneumonia nor pulmonary vascular congestion.   Electronically Signed   By: [name]   On: 09/23/2013 15:00   Dg Knee Right Port  09/23/2013   CLINICAL DATA:  Right knee replacement.  EXAM: PORTABLE RIGHT KNEE - 1-2 VIEW  COMPARISON:  MRI right knee 06/30/2013.  FINDINGS: The patient has a new right total knee arthroplasty. The device is located and no fracture is identified. Surgical drain is noted. There is some gas in the joint from surgery.  IMPRESSION: Right total knee replacement without  complication.   Electronically Signed   By: [name] M.D.   On: 09/23/2013 15:01    Disposition: 01-Home or Self Care      Discharge Instructions   CPM    Complete by:  As directed   Continuous passive motion machine (CPM):      Use the CPM from 0- to 60 for 6 hours per day.      You may increase by  10 per day.  You may break it up into 2 or 3 sessions per day.      Use CPM for 2-3 weeks or until you are told to stop.     Call MD / Call 911    Complete by:  As directed   If you experience chest pain or shortness of breath, CALL 911 and be transported to the hospital emergency room.  If you develope a fever above 101 F, pus (white drainage) or increased drainage or redness at the wound, or calf pain, call your surgeon's office.     Change dressing    Complete by:  As directed   Change dressing on Saturday, then change the dressing daily with sterile 4 x 4 inch gauze dressing and apply TED hose.  You may clean the incision with alcohol prior to redressing.     Constipation Prevention    Complete by:  As directed   Drink plenty of fluids.  Prune juice may be helpful.  You may use a stool softener, such as Colace (over the counter) 100 mg twice a day.  Use MiraLax (over the counter) for constipation as needed.     Diet - low sodium heart healthy    Complete by:  As directed      Discharge instructions    Complete by:  As directed   Weight bearing as tolerated.  Take Aspirin 1 tab a day for the next 30 days to prevent blood clots.  Change dressing daily starting on Saturday.  May shower on Monday but do not soak incision.  May apply ice for up to 20 minutes at a time for pain and swelling.  Follow up appointment in two weeks.     Do not put a pillow under the knee. Place it under the heel.    Complete by:  As directed   Place gray foam under operative heel when in bed or in a chair to work on extension     Increase activity slowly as tolerated    Complete by:  As directed      TED  hose    Complete by:  As directed   Use stockings (TED hose) for 2 weeks on both leg(s).  You may remove them at night for sleeping.           Follow-up Information   Follow up with [facility], MD. Schedule an appointment as soon as possible for a visit in 2 weeks.   Specialty:  Orthopedic Surgery   Contact information:   [place] [id number] [phone]        Signed: Renette Butters 09/27/2013, 7:39 AM

## 2013-09-27 NOTE — Progress Notes (Signed)
Called report to [place] at [facility] at this time.

## 2013-09-27 NOTE — Progress Notes (Signed)
Consult note                                            Patient Demographics  George Vega, is a 73 y.o. male, DOB - Apr 01, 1940, ZOX:[id number]  Admit date - 09/23/2013   Admitting Physician Ninetta Lights, MD  Outpatient Primary MD for the patient is [name], MD  LOS - 4   No chief complaint on file.        Hospitalist team will sign off kindly call with any questions.     Subjective:   George Vega today has, No headache, No chest pain, No abdominal pain - No Nausea, No new weakness tingling or numbness, No Cough - SOB.    Assessment & Plan    1. 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 IS on scheduled Librium, and much improved, have written to give him 2 more doses of Librium and then stop tomorrow morning, continue folic acid thymine along with IV Ativan per CIWA protocol. Continue Fall and aspiration precautions. Last drink about 4-5 days ago.  If he's stable in the morning we can stop Librium, place him on folic acid 1 mg daily and thiamine 100 mg daily with SNF discharge.  Hospitalist team will sign off kindly call with any questions.    2. CAD. On comminution of aspirin, Plavix, Coreg and statin for secondary prevention. No acute issues.   3. Dyslipidemia. Continue statin at home dose.   4. History of depression. Not suicidal homicidal on Paxil continue unchanged.   5.HTN - stable control on beta blocker.   6. Mild reactive leukocytosis (postop). Afebrile, UA chest x-ray stable. Monitor.   7. Right total knee replacement. We'll defer to primary team which is orthopedics.      Code Status: Full  Family Communication: Daughter bedside    Medications  Scheduled Meds: . aspirin EC  325 mg Oral Q breakfast  . atorvastatin  10 mg  Oral q1800  . carvedilol  6.25 mg Oral BID WC  . chlordiazePOXIDE  5 mg Oral BID  . clopidogrel  75 mg Oral QHS  . docusate sodium  100 mg Oral BID  . folic acid  1 mg Oral Daily  . multivitamin with minerals  1 tablet Oral Daily  . PARoxetine  20 mg Oral QPM  . polycarbophil  625 mg Oral Daily  . thiamine  100 mg Oral Daily   Continuous Infusions:  PRN Meds:.acetaminophen, alum & mag hydroxide-simeth, bisacodyl, LORazepam, LORazepam, ondansetron (ZOFRAN) IV, oxyCODONE  DVT Prophylaxis  aspirin per primary team which is orthopedics  Lab Results  Component Value Date   PLT 170 09/26/2013    Antibiotics    Anti-infectives   Start     Dose/Rate Route Frequency Ordered Stop   09/23/13 1800  ceFAZolin (ANCEF) IVPB 2 g/50 mL premix     2 g 100 mL/hr over 30 Minutes Intravenous Every 6 hours 09/23/13 1535 09/24/13 0102   09/23/13 0600  ceFAZolin (ANCEF) IVPB 2 g/50 mL premix     2 g 100 mL/hr over 30 Minutes Intravenous On call to O.R. 09/22/13 1459 09/23/13 1152          Objective:   Filed Vitals:   09/26/13 1643 09/26/13 2215 09/27/13 0538 09/27/13 0818  BP:  141/81 152/81 151/76  Pulse:  96 98 89  Temp:  97.9 F (36.6 C) 97.8 F (36.6 C)   TempSrc:  Oral Oral   Resp: 16 16 16 16   Height:      Weight:      SpO2: 100% 100% 96% 96%    Wt Readings from Last 3 Encounters:  09/23/13 91.173 kg (201 lb)  09/23/13 91.173 kg (201 lb)  09/15/13 91.4 kg (201 lb 8 oz)     Intake/Output Summary (Last 24 hours) at 09/27/13 0938 Last data filed at 09/27/13 0541  Gross per 24 hour  Intake    385 ml  Output    730 ml  Net   -345 ml     Physical Exam  Awake,Oriented X 3, No new F.N deficits, Normal affect [place].AT,PERRAL Supple Neck,No JVD, No cervical lymphadenopathy appriciated.  Symmetrical Chest wall movement, Good air movement bilaterally, CTAB RRR,No Gallops,Rubs or new Murmurs, No Parasternal Heave +ve B.Sounds, Abd Soft, No tenderness, No organomegaly  appriciated, No rebound - guarding or rigidity. No Cyanosis, Clubbing or edema, No new Rash or bruise     Data Review   Micro Results No results found for this or any previous visit (from the past 240 hour(s)).  Radiology Reports Dg Chest Port 1 View  09/25/2013   CLINICAL DATA:  73 year old male postoperative Cough and congestion. Initial encounter.  EXAM: PORTABLE CHEST - 1 VIEW  COMPARISON:  09/23/2013 and earlier.  FINDINGS: Portable AP upright view at 1110 hrs. Stable lung volumes. Stable cardiac size and mediastinal contours. Visualized tracheal air column is within normal limits. No pneumothorax or pleural effusion. Chronic increased interstitial markings which appear to be chronic in light of the 04/03/2013 comparison. Arthroplasty hardware about both shoulders.  IMPRESSION: Stable increased interstitial markings, favor chronic. No acute cardiopulmonary abnormality.   Electronically Signed   By: [name] M.D.   On: 09/25/2013 11:25   Dg Chest Port 1 View  09/23/2013   CLINICAL DATA:  Breathing difficulty is; postoperative from right knee replacement  EXAM: PORTABLE CHEST - 1 VIEW  COMPARISON:  PA and lateral chest of April 03, 2013  FINDINGS: The lungs are well-expanded. The interstitial markings are slightly more conspicuous than on the earlier study. The cardiac silhouette is top-normal in size. The pulmonary vascularity is not engorged. There is no pleural effusion or pneumothorax.  IMPRESSION: Mildly increased prominence of the pulmonary interstitium may reflect subsegmental atelectasis or mild edema. There is no focal pneumonia nor pulmonary vascular congestion.   Electronically Signed   By: [name]   On: 09/23/2013 15:00   Dg Knee Right Port  09/23/2013   CLINICAL DATA:  Right knee replacement.  EXAM: PORTABLE RIGHT KNEE - 1-2 VIEW  COMPARISON:  MRI right knee 06/30/2013.  FINDINGS: The patient has a new right total knee arthroplasty. The device is located and no fracture is  identified. Surgical drain is noted. There is some gas in the joint from surgery.  IMPRESSION: Right total knee replacement without complication.   Electronically Signed   By: [name] M.D.   On: 09/23/2013 15:01    CBC  Recent Labs Lab 09/24/13 0603 09/25/13 0713 09/26/13 0459  WBC 12.9* 19.8* 13.2*  HGB 12.0* 10.9* 9.9*  HCT 36.3* 33.0* 29.5*  PLT 191 191 170  MCV 96.0 96.5 95.8  MCH 31.7 31.9 32.1  MCHC 33.1 33.0 33.6  RDW 13.4 13.7 13.8  LYMPHSABS  --   --  1.6  MONOABS  --   --  1.0  EOSABS  --   --  0.1  BASOSABS  --   --  0.0    Chemistries   Recent Labs Lab 09/24/13 0603 09/25/13 0713 09/26/13 0459  NA 137 134* 138  K 5.5* 5.0 4.2  CL 100 99 102  CO2 21 24 25   GLUCOSE 163* 152* 111*  BUN 16 35* 28*  CREATININE 1.10 1.30 0.98  CALCIUM 8.7 8.9 8.2*   ------------------------------------------------------------------------------------------------------------------ estimated creatinine clearance is 76 ml/min (by C-G formula based on Cr of 0.98). ------------------------------------------------------------------------------------------------------------------ No results found for this basename: HGBA1C,  in the last 72 hours ------------------------------------------------------------------------------------------------------------------ No results found for this basename: CHOL, HDL, LDLCALC, TRIG, CHOLHDL, LDLDIRECT,  in the last 72 hours ------------------------------------------------------------------------------------------------------------------ No results found for this basename: TSH, T4TOTAL, FREET3, T3FREE, THYROIDAB,  in the last 72 hours ------------------------------------------------------------------------------------------------------------------ No results found for this basename: VITAMINB12, FOLATE, FERRITIN, TIBC, IRON, RETICCTPCT,  in the last 72 hours  Coagulation profile No results found for this basename: INR, PROTIME,  in the last  168 hours  No results found for this basename: DDIMER,  in the last 72 hours  Cardiac Enzymes No results found for this basename: CK, CKMB, TROPONINI, MYOGLOBIN,  in the last 168 hours ------------------------------------------------------------------------------------------------------------------ No components found with this basename: POCBNP,      Time Spent in minutes  35   Lala Lund K M.D on 09/27/2013 at 9:38 AM  Between 7am to 7pm - Pager - [phone]  After 7pm go to [email or web address] - password [email or web address]  And look for the night coverage person covering for me after hours  [facility] Office  [phone]   **Disclaimer: This note may have been dictated with voice recognition software. Similar sounding words can inadvertently be transcribed and this note may contain transcription errors which may not have been corrected upon publication of note.**

## 2013-09-27 NOTE — Progress Notes (Signed)
Patient is medically stable for D/C to [facility] today. Per [facility] admissions coordinator at [facility] patient can come today and is going to room 1202 on [address]. Patient's wife Fraser Din met with [employer] employee and completed paper work today. Clinical Education officer, museum (CSW) prepared D/C packet and faxed D/C summary, FL2, and H&P to weekend supervisor at [facility]. RN will call PTAR when patient's IV is completed. Patient's wife Fraser Din was at bedside and aware of above. Please reconsult if future social work needs arise. CSW signing off.   Blima Rich, [place] Weekend CSW [phone]

## 2013-09-27 NOTE — Progress Notes (Signed)
Physical Therapy Treatment Patient Details Name: George Vega MRN: [id number] DOB: 09-08-1940 Today's Date: 09/27/2013    History of Present Illness s/p Rt TKA     PT Comments    Pt currently at min guard level for mobility but cont's to require mod/max cueing throughout session for safety.  Pt's daughter present entire session.  At end of session when asked pt if he felt comfortable/safe to d/c home with wife he reports "no" but is refusing SNF.   Discussed with daughter & pt Re: d/c & how this clinician is hesitant about him d/cing home due to safety concerns & cont to recommend SNF but will f/u this afternoon  when wife is present to ensure she is able to assist pt as needed.     Follow Up Recommendations  SNF (family now interested in Home with [facility])     Equipment Recommendations  Rolling walker with 5" wheels;3in1 (PT)    Recommendations for Other Services OT consult     Precautions / Restrictions Precautions Precautions: Knee Restrictions Weight Bearing Restrictions: Yes RLE Weight Bearing: Weight bearing as tolerated    Mobility  Bed Mobility Overal bed mobility: Needs Assistance Bed Mobility: Supine to Sit     Supine to sit: Min guard     General bed mobility comments: Cues for technique & use of UE's to push himself to sitting upright.    Transfers Overall transfer level: Needs assistance Equipment used: Rolling walker (2 wheeled) Transfers: Sit to/from Stand Sit to Stand: Min guard         General transfer comment: Cues for hand placement & to extend RLE before sitting  Ambulation/Gait Ambulation/Gait assistance: Min guard Ambulation Distance (Feet): 80 Feet Assistive device: Rolling walker (2 wheeled) Gait Pattern/deviations: Decreased step length - right;Decreased step length - left;Step-through pattern;Trunk flexed Gait velocity: decreased   General Gait Details: Constant cues for sequencing- pt consistently stepping with LLE first.  Cues for  increased step length, stay closer to RW, & safety with RW.     Stairs Stairs: Yes Stairs assistance: Min guard Stair Management: One rail Right;Step to pattern;Sideways Number of Stairs: 3 (2x's) General stair comments: cues for sequencing & technique.  Pt's daughter present for stair training.    Wheelchair Mobility    Modified Rankin (Stroke Patients Only)       Balance                                    Cognition Arousal/Alertness: Awake/alert Behavior During Therapy: WFL for tasks assessed/performed Overall Cognitive Status: Impaired/Different from baseline Area of Impairment: Memory;Following commands;Safety/judgement     Memory: Decreased short-term memory;Decreased recall of precautions Following Commands: Follows multi-step commands inconsistently Safety/Judgement: Decreased awareness of safety;Decreased awareness of deficits          Exercises      General Comments             Home Living                      Prior Function            PT Goals (current goals can now be found in the care plan section) Acute Rehab PT Goals PT Goal Formulation: With patient Time For Goal Achievement: 09/27/13 Potential to Achieve Goals: Good Progress towards PT goals: Progressing toward goals    Frequency  7X/week    PT Plan  Current plan remains appropriate    Co-evaluation             End of Session Equipment Utilized During Treatment: Right knee immobilizer Activity Tolerance: Patient tolerated treatment well Patient left: in chair;with call bell/phone within reach;with chair alarm set;with family/visitor present     Time: 2518-9842 PT Time Calculation (min): 26 min  Charges:  $Gait Training: 8-22 mins $Therapeutic Activity: 8-22 mins                      Sarajane Marek, [place] [phone] 09/27/2013

## 2013-09-27 NOTE — Progress Notes (Signed)
Clinical Social Worker (CSW) met with patient and his wife Fraser Din at bedside. Wife reported depending on patient's progression she will take him home or [facility]. CSW sent [facility] a message via carefinder making them aware of accepted bed offer. CSW will continue to follow and assist as needed.   Blima Rich, [place] Weekend CSW [phone]

## 2013-09-28 ENCOUNTER — Other Ambulatory Visit: Payer: Self-pay | Admitting: *Deleted

## 2013-09-28 MED ORDER — OXYCODONE-ACETAMINOPHEN 5-325 MG PO TABS: ORAL_TABLET | ORAL | Status: DC

## 2013-09-28 NOTE — Telephone Encounter (Signed)
[facility] 

## 2013-09-29 ENCOUNTER — Non-Acute Institutional Stay (SKILLED_NURSING_FACILITY): Payer: Medicare Other | Admitting: Internal Medicine

## 2013-09-29 DIAGNOSIS — M1711 Unilateral primary osteoarthritis, right knee: Secondary | ICD-10-CM

## 2013-09-29 DIAGNOSIS — I10 Essential (primary) hypertension: Secondary | ICD-10-CM

## 2013-09-29 DIAGNOSIS — M1710 Unilateral primary osteoarthritis, unspecified knee: Secondary | ICD-10-CM

## 2013-09-29 DIAGNOSIS — D62 Acute posthemorrhagic anemia: Secondary | ICD-10-CM

## 2013-09-29 DIAGNOSIS — I2510 Atherosclerotic heart disease of native coronary artery without angina pectoris: Secondary | ICD-10-CM

## 2013-09-30 ENCOUNTER — Non-Acute Institutional Stay (SKILLED_NURSING_FACILITY): Payer: Medicare Other | Admitting: Adult Health

## 2013-09-30 ENCOUNTER — Encounter: Payer: Self-pay | Admitting: Adult Health

## 2013-09-30 DIAGNOSIS — M1710 Unilateral primary osteoarthritis, unspecified knee: Secondary | ICD-10-CM

## 2013-09-30 DIAGNOSIS — D62 Acute posthemorrhagic anemia: Secondary | ICD-10-CM | POA: Insufficient documentation

## 2013-09-30 DIAGNOSIS — I10 Essential (primary) hypertension: Secondary | ICD-10-CM

## 2013-09-30 DIAGNOSIS — F329 Major depressive disorder, single episode, unspecified: Secondary | ICD-10-CM

## 2013-09-30 DIAGNOSIS — M1711 Unilateral primary osteoarthritis, right knee: Secondary | ICD-10-CM

## 2013-09-30 DIAGNOSIS — E785 Hyperlipidemia, unspecified: Secondary | ICD-10-CM

## 2013-09-30 DIAGNOSIS — I2510 Atherosclerotic heart disease of native coronary artery without angina pectoris: Secondary | ICD-10-CM

## 2013-09-30 DIAGNOSIS — F3289 Other specified depressive episodes: Secondary | ICD-10-CM

## 2013-09-30 NOTE — Progress Notes (Signed)
HISTORY & PHYSICAL  DATE: 09/29/2013   FACILITY: [facility]  LEVEL OF CARE: SNF (31)  ALLERGIES:  Allergies  Allergen Reactions  . Tetanus Toxoid Anaphylaxis and Hives    REACTION: hives/throat swells shut  . Fish Oil Other (See Comments)    cramping  . Glucosamine Other (See Comments)    cramping  . Ibuprofen Nausea Only and Other (See Comments)    Stomach pains    CHIEF COMPLAINT:  Manage right knee osteoarthritis, acute blood loss anemia and CAD  HISTORY OF PRESENT ILLNESS: Patient is a 73 year old Caucasian male.  KNEE OSTEOARTHRITIS: Patient had a history of pain and functional disability in the knee due to end-stage osteoarthritis and has failed nonsurgical conservative treatments. Patient had worsening of pain with activity and weight bearing, pain that interfered with activities of daily living & pain with passive range of motion. Therefore patient underwent total knee arthroplasty and tolerated the procedure well. Patient is admitted to this facility for sort short-term rehabilitation. Patient denies knee pain.  ANEMIA: The anemia has been stable. The patient denies fatigue, melena or hematochezia. No complications from the medications currently being used. Postoperative the patient suffered an acute blood loss. Last hemoglobin 9.9, 10.9 and 12.  CAD: The angina has been stable. The patient denies dyspnea on exertion, orthopnea, pedal edema, palpitations and paroxysmal nocturnal dyspnea. No complications noted from the medication presently being used.  PAST MEDICAL HISTORY :  Past Medical History  Diagnosis Date  . Arthritis   . Spastic colon   . Chronic sinusitis   . Coronary artery disease   . Hypertension   . Depression   . Difficult intubation     was told with shoulder done 2006-alittle narrow  . Jejunostomy tube fell out   . Fall against sharp object     stiches in head    PAST SURGICAL HISTORY: Past Surgical History    Procedure Laterality Date  . Uvulopalatopharyngoplasty    . 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 Nasal sinus surgery    . Shoulder arthroscopy  03/01/2011    Procedure: ARTHROSCOPY SHOULDER;  Surgeon: Ninetta Lights, MD;  Location: [facility];  Service: Orthopedics;  Laterality: Right;  arthroscopy shoulder decompression subacromial partial acromioplasty with coracoacromial release, distal claviculectomy, debridement of labrium  . Foot neuroma surgery  2002  . Shoulder arthroscopy  03/01/2011    Procedure: ARTHROSCOPY SHOULDER;  Surgeon: Ninetta Lights, MD;  Location: [facility];  Service: Orthopedics;  Laterality: Right;  arthroscopy shoulder decompression subacromial partial acromioplasty with coracoacromial release, distal claviculectomy, debridement of labrium  . Total shoulder arthroplasty  06/28/2011    Procedure: TOTAL SHOULDER ARTHROPLASTY;  Surgeon: Ninetta Lights, MD;  Location: [facility];  Service: Orthopedics;  Laterality: Right;  . Cardiac catheterization  10/2008  . Cardiac catheterization  4/13    20% LM, chronic occluded mid LAD, 50% ostial LCX, 20% mid RI, RCA with collaterals to mid LAD, mid 10% stenosis, EF 60% 06/14/11  . Total knee arthroplasty Right 09/23/2013    Procedure: TOTAL KNEE ARTHROPLASTY;  Surgeon: Ninetta Lights, MD;  Location: [facility];  Service: Orthopedics;  Laterality: Right;    SOCIAL HISTORY:  reports that he quit smoking about 15 years ago. He does not have any smokeless tobacco history on file. He reports that he drinks alcohol. He reports that he does not use illicit  drugs.  FAMILY HISTORY: None  CURRENT MEDICATIONS: Reviewed per MAR/see medication list  REVIEW OF SYSTEMS:  See HPI otherwise 14 point ROS is negative.  PHYSICAL EXAMINATION  VS:  See VS section  GENERAL: no acute distress, normal body habitus EYES: conjunctivae normal, sclerae normal, normal eye lids MOUTH/THROAT: lips  without lesions,no lesions in the mouth,tongue is without lesions,uvula elevates in midline NECK: supple, trachea midline, no neck masses, no thyroid tenderness, no thyromegaly LYMPHATICS: no LAN in the neck, no supraclavicular LAN RESPIRATORY: breathing is even & unlabored, BS CTAB CARDIAC: RRR, no murmur,no extra heart sounds,  +1 edema of right lower extremity GI:  ABDOMEN: abdomen soft, normal BS, no masses, no tenderness  LIVER/SPLEEN: no hepatomegaly, no splenomegaly MUSCULOSKELETAL: HEAD: normal to inspection  EXTREMITIES: LEFT UPPER EXTREMITY: full range of motion, normal strength & tone RIGHT UPPER EXTREMITY:  full range of motion, normal strength & tone LEFT LOWER EXTREMITY:  full range of motion, normal strength & tone RIGHT LOWER EXTREMITY:   range of motion not tested due to surgery, normal strength & tone PSYCHIATRIC: the patient is alert & oriented to person, affect & behavior appropriate  LABS/RADIOLOGY:  Labs reviewed: Basic Metabolic Panel:  Recent Labs  09/24/13 0603 09/25/13 0713 09/26/13 0459  NA 137 134* 138  K 5.5* 5.0 4.2  CL 100 99 102  CO2 21 24 25   GLUCOSE 163* 152* 111*  BUN 16 35* 28*  CREATININE 1.10 1.30 0.98  CALCIUM 8.7 8.9 8.2*   Liver Function Tests:  Recent Labs  04/02/13 1722 09/15/13 1149  AST 38* 53*  ALT 33 74*  ALKPHOS 87 93  BILITOT 1.1 0.9  PROT 6.8 7.0  ALBUMIN 3.6 3.7   CBC:  Recent Labs  04/02/13 1722  09/15/13 1149 09/24/13 0603 09/25/13 0713 09/26/13 0459  WBC 18.1*  < > 8.6 12.9* 19.8* 13.2*  NEUTROABS 16.2*  --  5.5  --   --  10.5*  HGB 14.7  < > 15.4 12.0* 10.9* 9.9*  HCT 42.2  < > 44.4 36.3* 33.0* 29.5*  MCV 94.8  < > 93.3 96.0 96.5 95.8  PLT 195  < > 240 191 191 170  < > = values in this interval not displayed.  Cardiac Enzymes:  Recent Labs  04/02/13 1722  TROPONINI <0.30   CBG:  Recent Labs  04/02/13 1839  GLUCAP 104*    PORTABLE RIGHT KNEE - 1-2 VIEW   COMPARISON:  MRI right knee  06/30/2013.   FINDINGS: The patient has a new right total knee arthroplasty. The device is located and no fracture is identified. Surgical drain is noted. There is some gas in the joint from surgery.   IMPRESSION: Right total knee replacement without complication.     PORTABLE CHEST - 1 VIEW   COMPARISON:  09/23/2013 and earlier.   FINDINGS: Portable AP upright view at 1110 hrs. Stable lung volumes. Stable cardiac size and mediastinal contours. Visualized tracheal air column is within normal limits. No pneumothorax or pleural effusion. Chronic increased interstitial markings which appear to be chronic in light of the 04/03/2013 comparison. Arthroplasty hardware about both shoulders.   IMPRESSION: Stable increased interstitial markings, favor chronic. No acute cardiopulmonary abnormality.   ASSESSMENT/PLAN:  Right knee osteoarthritis-status post total knee arthroplasty. Continue rehabilitation. Acute blood loss anemia-check hemoglobin CAD-stable Hypertension-blood pressure borderline. We'll monitor. depression- continue Paxil Check CBC  I have reviewed patient's medical records received at admission/from hospitalization.  CPT CODE: 76546  Merlene Laughter, MD  [place] [phone]

## 2013-09-30 NOTE — Progress Notes (Signed)
Patient ID: George Vega, male   DOB: [birth year], 73 y.o.   MRN: [id number]              PROGRESS NOTE  DATE: 09/30/2013   FACILITY: [facility]  LEVEL OF CARE: SNF (31)  Acute Visit  CHIEF COMPLAINT:  Discharge Notes  HISTORY OF PRESENT ILLNESS: This is a 73 year old male who is for discharge home to outpatient rehabilitation. DME: Rolling walker and bedside commode. 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 Patient was admitted to this facility for short-term rehabilitation after the patient's recent hospitalization.  Patient has completed SNF rehabilitation and therapy has cleared the patient for discharge.  Reassessment of ongoing problem(s):  HTN: Pt 's HTN remains stable.  Denies CP, sob, DOE, pedal edema, headaches, dizziness or visual disturbances.  No complications from the medications currently being used.  Last BP : 122/80  CAD: The angina has been stable. The patient denies dyspnea on exertion, orthopnea, pedal edema, palpitations and paroxysmal nocturnal dyspnea. No complications noted from the medication presently being used.  DEPRESSION: The depression remains stable. Patient denies ongoing feelings of sadness, insomnia, anedhonia or lack of appetite. No complications reported from the medications currently being used. Staff do not report behavioral problems.    PAST MEDICAL HISTORY : Reviewed.  No changes/see problem list  CURRENT MEDICATIONS: Reviewed per MAR/see medication list  REVIEW OF SYSTEMS:  GENERAL: no change in appetite, no fatigue, no weight changes, no fever, chills or weakness RESPIRATORY: no cough, SOB, DOE, wheezing, hemoptysis CARDIAC: no chest pain, edema or palpitations GI: no abdominal pain, diarrhea, constipation, heart burn, nausea or vomiting  PHYSICAL EXAMINATION  GENERAL: no acute distress, normal body habitus EYES: conjunctivae  normal, sclerae normal, normal eye lids NECK: supple, trachea midline, no neck masses, no thyroid tenderness, no thyromegaly LYMPHATICS: no LAN in the neck, no supraclavicular LAN RESPIRATORY: breathing is even & unlabored, BS CTAB CARDIAC: RRR, no murmur,no extra heart sounds, no edema GI: abdomen soft, normal BS, no masses, no tenderness, no hepatomegaly, no splenomegaly EXTREMITIES: able to move all 4 extremities PSYCHIATRIC: the patient is alert & oriented to person, affect & behavior appropriate  LABS/RADIOLOGY: Labs reviewed: Basic Metabolic Panel:  Recent Labs  09/24/13 0603 09/25/13 0713 09/26/13 0459  NA 137 134* 138  K 5.5* 5.0 4.2  CL 100 99 102  CO2 21 24 25   GLUCOSE 163* 152* 111*  BUN 16 35* 28*  CREATININE 1.10 1.30 0.98  CALCIUM 8.7 8.9 8.2*   Liver Function Tests:  Recent Labs  04/02/13 1722 09/15/13 1149  AST 38* 53*  ALT 33 74*  ALKPHOS 87 93  BILITOT 1.1 0.9  PROT 6.8 7.0  ALBUMIN 3.6 3.7   CBC:  Recent Labs  04/02/13 1722  09/15/13 1149 09/24/13 0603 09/25/13 0713 09/26/13 0459  WBC 18.1*  < > 8.6 12.9* 19.8* 13.2*  NEUTROABS 16.2*  --  5.5  --   --  10.5*  HGB 14.7  < > 15.4 12.0* 10.9* 9.9*  HCT 42.2  < > 44.4 36.3* 33.0* 29.5*  MCV 94.8  < > 93.3 96.0 96.5 95.8  PLT 195  < > 240 191 191 170  < > = values in this interval not displayed.  Cardiac Enzymes:  Recent Labs  04/02/13 1722  TROPONINI <0.30   CBG:  Recent Labs  04/02/13 1839  GLUCAP 104*  EXAM: PORTABLE RIGHT KNEE - 1-2 VIEW   COMPARISON:  MRI right knee 06/30/2013.   FINDINGS: The patient has a new right total knee arthroplasty. The device is located and no fracture is identified. Surgical drain is noted. There is some gas in the joint from surgery.   IMPRESSION: Right total knee replacement without complication.   EXAM: PORTABLE CHEST - 1 VIEW   COMPARISON:  09/23/2013 and earlier.   FINDINGS: Portable AP upright view at 1110 hrs. Stable lung  volumes. Stable cardiac size and mediastinal contours. Visualized tracheal air column is within normal limits. No pneumothorax or pleural effusion. Chronic increased interstitial markings which appear to be chronic in light of the 04/03/2013 comparison. Arthroplasty hardware about both shoulders.   IMPRESSION: Stable increased interstitial markings, favor chronic. No acute cardiopulmonary abnormality.     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  Patient will have outpatient rehabilitation.  DME provided: Rolling walker and bedside commode  Total discharge time: Greater than 30 minutes  Discharge time involved coordination of the discharge process with Education officer, museum, nursing staff and therapy department. Medical justification for DME verified.  CPT CODE: 43735  Seth Bake - NP [facility] [phone]

## 2013-10-28 ENCOUNTER — Other Ambulatory Visit (HOSPITAL_COMMUNITY): Payer: Self-pay | Admitting: Interventional Radiology

## 2013-10-28 DIAGNOSIS — R42 Dizziness and giddiness: Secondary | ICD-10-CM

## 2013-10-28 DIAGNOSIS — I6529 Occlusion and stenosis of unspecified carotid artery: Secondary | ICD-10-CM

## 2013-11-02 ENCOUNTER — Ambulatory Visit (HOSPITAL_COMMUNITY)
Admission: RE | Admit: 2013-11-02 | Discharge: 2013-11-02 | Disposition: A | Discharge status: Home / Self Care | Payer: Medicare Other | Source: Ambulatory Visit | Attending: Interventional Radiology | Admitting: Interventional Radiology

## 2013-11-02 DIAGNOSIS — I6529 Occlusion and stenosis of unspecified carotid artery: Secondary | ICD-10-CM

## 2013-11-02 DIAGNOSIS — I6509 Occlusion and stenosis of unspecified vertebral artery: Secondary | ICD-10-CM | POA: Insufficient documentation

## 2013-11-02 DIAGNOSIS — R42 Dizziness and giddiness: Secondary | ICD-10-CM | POA: Diagnosis present

## 2013-11-02 LAB — CREATININE, SERUM
Creatinine, Ser: 0.89 mg/dL (ref 0.50–1.35)
GFR calc Af Amer: >90 mL/min (ref >90)
GFR, EST NON AFRICAN AMERICAN: 83 mL/min — AB (ref >90)

## 2013-11-02 MED ORDER — GADOBENATE DIMEGLUMINE 529 MG/ML IV SOLN
20.0000 mL | Freq: Once | INTRAVENOUS | Status: AC
  Administered 2013-11-02: 20 mL via INTRAVENOUS

## 2013-11-03 ENCOUNTER — Telehealth (HOSPITAL_COMMUNITY): Payer: Self-pay | Admitting: Interventional Radiology

## 2013-11-03 NOTE — Telephone Encounter (Signed)
Called pt, told him that his MRI/MRA was good and he would need another in 1 year per Deveshwar. Also told him that per Estanislado Pandy he should see a neurologist or his PCP for his current sx. He states understanding and is in agreement with this plan of care. JM

## 2013-12-01 ENCOUNTER — Encounter: Payer: Self-pay | Admitting: Gastroenterology

## 2013-12-29 ENCOUNTER — Telehealth (HOSPITAL_COMMUNITY): Payer: Self-pay | Admitting: Interventional Radiology

## 2013-12-29 NOTE — Telephone Encounter (Signed)
Called pt, spoke to him concerning his continued headaches and dizziness. We are referring him to Dr. Floyde Parkins, MD for a neurology consultation. I have faxed a referral to Dr. Jannifer Franklin today 12/29/13 JM

## 2014-01-05 ENCOUNTER — Telehealth (HOSPITAL_COMMUNITY): Payer: Self-pay | Admitting: Interventional Radiology

## 2014-01-05 NOTE — Telephone Encounter (Signed)
Called and spoke to pt on 12/29/13 concerning a referral to a neurologist. I told him that I would fax over the referral and notes to Dr. Floyde Parkins, MD. I did in fact fax over that referral that day (12/29/13). I received a call today 01/05/14 from the pt's PCP stating that the pt's wife had called Dr. Jannifer Franklin' office and they told her they had not received a referral on this patient. I re-faxed the referral again this morning 01/05/14. JM

## 2014-01-12 ENCOUNTER — Encounter: Payer: Self-pay | Admitting: Neurology

## 2014-01-12 ENCOUNTER — Ambulatory Visit (INDEPENDENT_AMBULATORY_CARE_PROVIDER_SITE_OTHER): Payer: Medicare Other | Admitting: Neurology

## 2014-01-12 VITALS — BP 156/101 | HR 75 | Ht 70.0 in | Wt 209.6 lb

## 2014-01-12 DIAGNOSIS — R519 Headache, unspecified: Secondary | ICD-10-CM | POA: Insufficient documentation

## 2014-01-12 DIAGNOSIS — E538 Deficiency of other specified B group vitamins: Secondary | ICD-10-CM

## 2014-01-12 DIAGNOSIS — I2510 Atherosclerotic heart disease of native coronary artery without angina pectoris: Secondary | ICD-10-CM

## 2014-01-12 DIAGNOSIS — R51 Headache: Secondary | ICD-10-CM

## 2014-01-12 DIAGNOSIS — G441 Vascular headache, not elsewhere classified: Secondary | ICD-10-CM

## 2014-01-12 DIAGNOSIS — H8110 Benign paroxysmal vertigo, unspecified ear: Secondary | ICD-10-CM

## 2014-01-12 MED ORDER — TOPIRAMATE 25 MG PO TABS: ORAL_TABLET | ORAL | Status: DC

## 2014-01-12 NOTE — Progress Notes (Signed)
Reason for visit: episodic dizziness  George Vega is a 73 y.o. 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 The patient has been seen by Dr. Estanislado Pandy for follow-up, and a MRA of the head was done in August 2015 which confirmed the left vertebral artery occlusion, but the right vertebral artery remains widely patent, and the basilar artery appears to be unremarkable. The patient indicates that since August 2014, he has had episodic vertigo that may last only a few seconds, and only occurs while he is up and walking. The episodes may occur 3 or 4 times every 3 months. The patient has sustained a fall in April 2015. The patient also reports intermittent headaches that began in August 2014, and the headaches are very mild, and are almost daily in nature. Occasionally, he may get a more severe headache associated with some nausea, but no vomiting. He denies that the vertigo is associated with the headache. He also has almost daily events of word finding problems, not true confusion. The patient denies any palpitations of the heart or chest pain. He has been taking meclizine off and on. He denies any episodes of slurred speech, problems with swallowing, double vision, numbness or weakness of the extremities associated with the episodes of headache or dizziness. He returns to this office for an evaluation.   Past Medical History  Diagnosis Date  . Arthritis   . Spastic colon   . Chronic sinusitis   . Coronary artery disease   . Hypertension   . Depression   . Difficult intubation     was told with shoulder done 2006-alittle narrow  . Jejunostomy tube fell out   . Fall against sharp object     stiches in head  . Headache 01/12/2014    Past Surgical History  Procedure Laterality  Date  . Uvulopalatopharyngoplasty    . 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 Nasal sinus surgery    . Shoulder arthroscopy  03/01/2011    Procedure: ARTHROSCOPY SHOULDER;  Surgeon: Ninetta Lights, MD;  Location: [place];  Service: Orthopedics;  Laterality: Right;  arthroscopy shoulder decompression subacromial partial acromioplasty with coracoacromial release, distal claviculectomy, debridement of labrium  . Foot neuroma surgery  2002  . Shoulder arthroscopy  03/01/2011    Procedure: ARTHROSCOPY SHOULDER;  Surgeon: Ninetta Lights, MD;  Location: [place];  Service: Orthopedics;  Laterality: Right;  arthroscopy shoulder decompression subacromial partial acromioplasty with coracoacromial release, distal claviculectomy, debridement of labrium  . Total shoulder arthroplasty  06/28/2011    Procedure: TOTAL SHOULDER ARTHROPLASTY;  Surgeon: Ninetta Lights, MD;  Location: [place];  Service: Orthopedics;  Laterality: Right;  . Cardiac catheterization  10/2008  . Cardiac catheterization  4/13    20% LM, chronic occluded mid LAD, 50% ostial LCX, 20% mid RI, RCA with collaterals to mid LAD, mid 10% stenosis, EF 60% 06/14/11  . Total knee arthroplasty Right 09/23/2013    Procedure: TOTAL KNEE ARTHROPLASTY;  Surgeon: Ninetta Lights, MD;  Location: [place];  Service: Orthopedics;  Laterality: Right;    Family History  Problem Relation Age of Onset  . Cancer Mother   . Heart attack Father   . Migraines Brother  Social history:  reports that he quit smoking about 15 years ago. He has never used smokeless tobacco. He reports that he drinks alcohol. He reports that he does not use illicit drugs.  Medications:  Current Outpatient Prescriptions on File Prior to Visit  Medication Sig Dispense Refill  . acetaminophen (TYLENOL) 325 MG tablet Take 650 mg by mouth every 6 (six) hours as needed for moderate pain.    Marland Kitchen oxyCODONE-acetaminophen (ROXICET)  5-325 MG per tablet Take one tablet by mouth every 4 hours as needed for moderate pain; Take two tablets by mouth every 4 hours as needed for severe pain 360 tablet 0  . PARoxetine (PAXIL) 20 MG tablet Take 20 mg by mouth every evening.      . polycarbophil (FIBERCON) 625 MG tablet Take 625 mg by mouth daily.      . rosuvastatin (CRESTOR) 5 MG tablet Take 5 mg by mouth every morning.      No current facility-administered medications on file prior to visit.      Allergies  Allergen Reactions  . Tetanus Toxoid Anaphylaxis and Hives    REACTION: hives/throat swells shut  . Fish Oil Other (See Comments)    cramping  . Glucosamine Other (See Comments)    cramping  . Ibuprofen Nausea Only and Other (See Comments)    Stomach pains  . Naproxen Diarrhea    ROS:  Out of a complete 14 system review of symptoms, the patient complains only of the following symptoms, and all other reviewed systems are negative.  Hearing loss, ringing in the ears, dizziness Snoring Joint pain Memory loss, confusion, headache, numbness, dizziness Decreased energy   Blood pressure 156/101, pulse 75, height 5\' 10"  (1.778 m), weight 209 lb 9.6 oz (95.074 kg).  Physical Exam  General: The patient is alert and cooperative at the time of the examination.  Eyes: Pupils are equal, round, and reactive to light. Discs are flat bilaterally.  Ears: Tympanic membranes are clear bilaterally.  Neck: The neck is supple, no carotid bruits are noted.  Respiratory: The respiratory examination is clear.  Cardiovascular: The cardiovascular examination reveals a regular rate and rhythm, no obvious murmurs or rubs are noted.  Skin: Extremities are without significant edema.  Neurologic Exam  Mental status: The patient is alert and oriented x 3 at the time of the examination. The patient has apparent normal recent and remote memory, with an apparently normal attention span and concentration ability.  Cranial nerves:  Facial symmetry is not present. Mild ptosis of the left eye is seen. There is good sensation of the face to pinprick and soft touch bilaterally. The strength of the facial muscles and the muscles to head turning and shoulder shrug are normal bilaterally. Speech is well enunciated, no aphasia or dysarthria is noted. Extraocular movements are full. Visual fields are full. The tongue is midline, and the patient has symmetric elevation of the soft palate. No obvious hearing deficits are noted.  Motor: The motor testing reveals 5 over 5 strength of all 4 extremities. Good symmetric motor tone is noted throughout.  Sensory: Sensory testing is intact to pinprick, soft touch, vibration sensation, and position sense on all 4 extremities. No evidence of extinction is noted.  Coordination: Cerebellar testing reveals good finger-nose-finger and heel-to-shin bilaterally.  Gait and station: Gait is normal. Tandem gait is unsteady. Romberg is negative. No drift is seen.  Reflexes: Deep tendon reflexes are symmetric and normal bilaterally. Toes are downgoing bilaterally.   MRI brain/MRA head  11/02/13:  IMPRESSION: No acute infarction. 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 Chronic small vessel changes throughout the cerebral hemispheric white matter.  Left vertebral artery occlusion. No other major vessel occlusion. Distal vessel atherosclerotic narrowing and irregularity diffusely.    Assessment/Plan:  1. Episodic vertigo  2. Headache  3. 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 The patient likely is not suffering from TIA-type events. He is reporting headaches, intermittent brief episodes of vertigo, and some word finding problems that began following the stroke event in August 2014. The patient will be set up for some blood work today, and he will be  placed on Topamax for the headaches, hopefully to help the vertigo as well. The patient will follow-up in about 3 months. The patient is on aspirin therapy currently.  Jill Alexanders MD 01/12/2014 6:57 PM  [facility] [address] [place],  [address]  Phone [phone] Fax [phone]

## 2014-01-12 NOTE — Patient Instructions (Signed)

## 2014-01-13 ENCOUNTER — Other Ambulatory Visit: Payer: Self-pay | Admitting: Neurology

## 2014-01-13 ENCOUNTER — Telehealth: Payer: Self-pay | Admitting: Neurology

## 2014-01-13 DIAGNOSIS — E538 Deficiency of other specified B group vitamins: Secondary | ICD-10-CM

## 2014-01-13 MED ORDER — TOPIRAMATE 25 MG PO TABS: ORAL_TABLET | ORAL | Status: DC

## 2014-01-13 NOTE — Addendum Note (Signed)
Addended by: Lolita Cram T on: 01/13/2014 08:24 AM   Modules accepted: Orders

## 2014-01-13 NOTE — Telephone Encounter (Signed)
Rx has been resent.  I called the patient back at home, got no answer, no voicemail.  I called cell, got no answer.  Left message.

## 2014-01-13 NOTE — Telephone Encounter (Signed)
Patient calling to state that he went to go pick up his Topamax script from his [facility] on [address] but it was not there, please return call and advise.

## 2014-01-14 ENCOUNTER — Other Ambulatory Visit: Payer: Self-pay | Admitting: Neurology

## 2014-01-14 ENCOUNTER — Other Ambulatory Visit: Payer: Self-pay | Admitting: *Deleted

## 2014-01-14 ENCOUNTER — Other Ambulatory Visit (INDEPENDENT_AMBULATORY_CARE_PROVIDER_SITE_OTHER): Payer: Self-pay

## 2014-01-14 DIAGNOSIS — E538 Deficiency of other specified B group vitamins: Secondary | ICD-10-CM

## 2014-01-14 DIAGNOSIS — Z0289 Encounter for other administrative examinations: Secondary | ICD-10-CM

## 2014-01-15 LAB — SEDIMENTATION RATE: Sed Rate: 2 mm/hr (ref 0–30)

## 2014-01-15 LAB — VITAMIN B12: VITAMIN B 12: 349 pg/mL (ref 211–946)

## 2014-01-15 NOTE — Progress Notes (Signed)
Quick Note:  Spoke to patient and relayed unremarkable blood work results, per Dr. Jannifer Franklin. Patient verbalized understanding. ______

## 2014-01-27 ENCOUNTER — Encounter: Payer: Self-pay | Admitting: Neurology

## 2014-02-01 DIAGNOSIS — I639 Cerebral infarction, unspecified: Secondary | ICD-10-CM | POA: Insufficient documentation

## 2014-02-02 ENCOUNTER — Encounter: Payer: Self-pay | Admitting: Neurology

## 2014-02-05 ENCOUNTER — Emergency Department (HOSPITAL_COMMUNITY)
Admission: EM | Admit: 2014-02-05 | Discharge: 2014-02-05 | Disposition: A | Discharge status: Home / Self Care | Payer: Medicare Other | Attending: Emergency Medicine | Admitting: Emergency Medicine

## 2014-02-05 ENCOUNTER — Emergency Department (HOSPITAL_COMMUNITY): Payer: Medicare Other

## 2014-02-05 ENCOUNTER — Encounter (HOSPITAL_COMMUNITY): Payer: Self-pay | Admitting: Emergency Medicine

## 2014-02-05 DIAGNOSIS — Z79899 Other long term (current) drug therapy: Secondary | ICD-10-CM | POA: Insufficient documentation

## 2014-02-05 DIAGNOSIS — Y9289 Other specified places as the place of occurrence of the external cause: Secondary | ICD-10-CM | POA: Insufficient documentation

## 2014-02-05 DIAGNOSIS — I2510 Atherosclerotic heart disease of native coronary artery without angina pectoris: Secondary | ICD-10-CM | POA: Diagnosis not present

## 2014-02-05 DIAGNOSIS — Y9389 Activity, other specified: Secondary | ICD-10-CM | POA: Diagnosis not present

## 2014-02-05 DIAGNOSIS — Y998 Other external cause status: Secondary | ICD-10-CM | POA: Insufficient documentation

## 2014-02-05 DIAGNOSIS — S301XXA Contusion of abdominal wall, initial encounter: Secondary | ICD-10-CM | POA: Diagnosis not present

## 2014-02-05 DIAGNOSIS — S2231XA Fracture of one rib, right side, initial encounter for closed fracture: Secondary | ICD-10-CM | POA: Insufficient documentation

## 2014-02-05 DIAGNOSIS — W19XXXA Unspecified fall, initial encounter: Secondary | ICD-10-CM

## 2014-02-05 DIAGNOSIS — W132XXA Fall from, out of or through roof, initial encounter: Secondary | ICD-10-CM | POA: Diagnosis not present

## 2014-02-05 DIAGNOSIS — Z8709 Personal history of other diseases of the respiratory system: Secondary | ICD-10-CM | POA: Insufficient documentation

## 2014-02-05 DIAGNOSIS — Z8719 Personal history of other diseases of the digestive system: Secondary | ICD-10-CM | POA: Diagnosis not present

## 2014-02-05 DIAGNOSIS — S299XXA Unspecified injury of thorax, initial encounter: Secondary | ICD-10-CM | POA: Diagnosis present

## 2014-02-05 DIAGNOSIS — I10 Essential (primary) hypertension: Secondary | ICD-10-CM | POA: Diagnosis not present

## 2014-02-05 DIAGNOSIS — M1990 Unspecified osteoarthritis, unspecified site: Secondary | ICD-10-CM | POA: Diagnosis not present

## 2014-02-05 DIAGNOSIS — Z7982 Long term (current) use of aspirin: Secondary | ICD-10-CM | POA: Insufficient documentation

## 2014-02-05 DIAGNOSIS — T148XXA Other injury of unspecified body region, initial encounter: Secondary | ICD-10-CM

## 2014-02-05 LAB — COMPREHENSIVE METABOLIC PANEL
ALK PHOS: 101 U/L (ref 39–117)
ALT: 42 U/L (ref 0–53)
ANION GAP: 14 (ref 5–15)
AST: 36 U/L (ref 0–37)
Albumin: 3.5 g/dL (ref 3.5–5.2)
BILIRUBIN TOTAL: 0.7 mg/dL (ref 0.3–1.2)
BUN: 12 mg/dL (ref 6–23)
CHLORIDE: 99 meq/L (ref 96–112)
CO2: 25 mEq/L (ref 19–32)
Calcium: 9.2 mg/dL (ref 8.4–10.5)
Creatinine, Ser: 0.96 mg/dL (ref 0.50–1.35)
GFR calc Af Amer: >90 mL/min (ref >90)
GFR calc non Af Amer: 80 mL/min — ABNORMAL LOW (ref >90)
GLUCOSE: 108 mg/dL — AB (ref 70–99)
POTASSIUM: 4.3 meq/L (ref 3.7–5.3)
Sodium: 138 mEq/L (ref 137–147)
Total Protein: 7 g/dL (ref 6.0–8.3)

## 2014-02-05 LAB — CBC WITH DIFFERENTIAL/PLATELET
Basophils Absolute: 0.1 10*3/uL (ref 0.0–0.1)
Basophils Relative: 1 % (ref 0–1)
Eosinophils Absolute: 0.4 10*3/uL (ref 0.0–0.7)
Eosinophils Relative: 5 % (ref 0–5)
HCT: 45.2 % (ref 39.0–52.0)
HEMOGLOBIN: 15.3 g/dL (ref 13.0–17.0)
LYMPHS ABS: 1.7 10*3/uL (ref 0.7–4.0)
LYMPHS PCT: 20 % (ref 12–46)
MCH: 31.3 pg (ref 26.0–34.0)
MCHC: 33.8 g/dL (ref 30.0–36.0)
MCV: 92.4 fL (ref 78.0–100.0)
MONOS PCT: 9 % (ref 3–12)
Monocytes Absolute: 0.8 10*3/uL (ref 0.1–1.0)
NEUTROS ABS: 5.5 10*3/uL (ref 1.7–7.7)
NEUTROS PCT: 65 % (ref 43–77)
Platelets: 222 10*3/uL (ref 150–400)
RBC: 4.89 MIL/uL (ref 4.22–5.81)
RDW: 14.4 % (ref 11.5–15.5)
WBC: 8.5 10*3/uL (ref 4.0–10.5)

## 2014-02-05 LAB — URINALYSIS, ROUTINE W REFLEX MICROSCOPIC
BILIRUBIN URINE: NEGATIVE
Glucose, UA: NEGATIVE mg/dL
HGB URINE DIPSTICK: NEGATIVE
Ketones, ur: NEGATIVE mg/dL
Leukocytes, UA: NEGATIVE
Nitrite: NEGATIVE
Protein, ur: NEGATIVE mg/dL
SPECIFIC GRAVITY, URINE: 1.021 (ref 1.005–1.030)
Urobilinogen, UA: 0.2 mg/dL (ref 0.0–1.0)
pH: 6 (ref 5.0–8.0)

## 2014-02-05 LAB — LIPASE, BLOOD: Lipase: 37 U/L (ref 11–59)

## 2014-02-05 MED ORDER — HYDROCODONE-ACETAMINOPHEN 5-325 MG PO TABS
1.0000 | ORAL_TABLET | Freq: Once | ORAL | Status: AC
  Administered 2014-02-05: 1 via ORAL
  Filled 2014-02-05: qty 1

## 2014-02-05 MED ORDER — HYDROCODONE-ACETAMINOPHEN 5-325 MG PO TABS: 1.0000 | ORAL_TABLET | Freq: Four times a day (QID) | ORAL | Status: DC | PRN

## 2014-02-05 MED ORDER — METHOCARBAMOL 500 MG PO TABS: 500.0000 mg | ORAL_TABLET | Freq: Two times a day (BID) | ORAL | Status: DC

## 2014-02-05 NOTE — ED Notes (Signed)
The patient said he fell through the roof last night while he was hanging Christmas decorations.  He advised me that he thought he had just gotten the wind knocked out of him.  He says he thought he would be okay but today he presents with right, rib and abdominal pain.  The patient looks pale and is diaphoretic.  He rates his pain 3/10 if he is sitting but if he is moving he is 10/10.

## 2014-02-05 NOTE — Discharge Instructions (Signed)
Please take the pain meds and muscle relaxants as requested. Use the Incentive spirometer as advised.   Rib Fracture A rib fracture is a break or crack in one of the bones of the ribs. The ribs are a group of long, curved bones that wrap around your chest and attach to your spine. They protect your lungs and other organs in the chest cavity. A broken or cracked rib is often painful, but most do not cause other problems. Most rib fractures heal on their own over time. However, rib fractures can be more serious if multiple ribs are broken or if broken ribs move out of place and push against other structures. CAUSES   A direct blow to the chest. For example, this could happen during contact sports, a car accident, or a fall against a hard object.  Repetitive movements with high force, such as pitching a baseball or having severe coughing spells. SYMPTOMS   Pain when you breathe in or cough.  Pain when someone presses on the injured area. DIAGNOSIS  Your caregiver will perform a physical exam. Various imaging tests may be ordered to confirm the diagnosis and to look for related injuries. These tests may include a chest X-ray, computed tomography (CT), magnetic resonance imaging (MRI), or a bone scan. TREATMENT  Rib fractures usually heal on their own in 1-3 months. The longer healing period is often associated with a continued cough or other aggravating activities. During the healing period, pain control is very important. Medication is usually given to control pain. Hospitalization or surgery may be needed for more severe injuries, such as those in which multiple ribs are broken or the ribs have moved out of place.  HOME CARE INSTRUCTIONS   Avoid strenuous activity and any activities or movements that cause pain. Be careful during activities and avoid bumping the injured rib.  Gradually increase activity as directed by your caregiver.  Only take over-the-counter or prescription medications as  directed by your caregiver. Do not take other medications without asking your caregiver first.  Apply ice to the injured area for the first 1-2 days after you have been treated or as directed by your caregiver. Applying ice helps to reduce inflammation and pain.  Put ice in a plastic bag.  Place a towel between your skin and the bag.   Leave the ice on for 15-20 minutes at a time, every 2 hours while you are awake.  Perform deep breathing as directed by your caregiver. This will help prevent pneumonia, which is a common complication of a broken rib. Your caregiver may instruct you to:  Take deep breaths several times a day.  Try to cough several times a day, holding a pillow against the injured area.  Use a device called an incentive spirometer to practice deep breathing several times a day.  Drink enough fluids to keep your urine clear or pale yellow. This will help you avoid constipation.   Do not wear a rib belt or binder. These restrict breathing, which can lead to pneumonia.  SEEK IMMEDIATE MEDICAL CARE IF:   You have a fever.   You have difficulty breathing or shortness of breath.   You develop a continual cough, or you cough up thick or bloody sputum.  You feel sick to your stomach (nausea), throw up (vomit), or have abdominal pain.   You have worsening pain not controlled with medications.  MAKE SURE YOU:  Understand these instructions.  Will watch your condition.  Will get help right  away if you are not doing well or get worse. Document Released: 02/26/2005 Document Revised: 10/29/2012 Document Reviewed: 04/30/2012 [facility] Patient Information 2015 [place], [place]. This information is not intended to replace advice given to you by your health care provider. Make sure you discuss any questions you have with your health care provider. Hematoma A hematoma is a collection of blood under the skin, in an organ, in a body space, in a joint space, or in other tissue.  The blood can clot to form a lump that you can see and feel. The lump is often firm and may sometimes become sore and tender. Most hematomas get better in a few days to weeks. However, some hematomas may be serious and require medical care. Hematomas can range in size from very small to very large. CAUSES  A hematoma can be caused by a blunt or penetrating injury. It can also be caused by spontaneous leakage from a blood vessel under the skin. Spontaneous leakage from a blood vessel is more likely to occur in older people, especially those taking blood thinners. Sometimes, a hematoma can develop after certain medical procedures. SIGNS AND SYMPTOMS   A firm lump on the body.  Possible pain and tenderness in the area.  Bruising.Blue, dark blue, purple-red, or yellowish skin may appear at the site of the hematoma if the hematoma is close to the surface of the skin. For hematomas in deeper tissues or body spaces, the signs and symptoms may be subtle. For example, an intra-abdominal hematoma may cause abdominal pain, weakness, fainting, and shortness of breath. An intracranial hematoma may cause a headache or symptoms such as weakness, trouble speaking, or a change in consciousness. DIAGNOSIS  A hematoma can usually be diagnosed based on your medical history and a physical exam. Imaging tests may be needed if your health care provider suspects a hematoma in deeper tissues or body spaces, such as the abdomen, head, or chest. These tests may include ultrasonography or a CT scan.  TREATMENT  Hematomas usually go away on their own over time. Rarely does the blood need to be drained out of the body. Large hematomas or those that may affect vital organs will sometimes need surgical drainage or monitoring. HOME CARE INSTRUCTIONS   Apply ice to the injured area:   Put ice in a plastic bag.   Place a towel between your skin and the bag.   Leave the ice on for 20 minutes, 2-3 times a day for the first 1  to 2 days.   After the first 2 days, switch to using warm compresses on the hematoma.   Elevate the injured area to help decrease pain and swelling. Wrapping the area with an elastic bandage may also be helpful. Compression helps to reduce swelling and promotes shrinking of the hematoma. Make sure the bandage is not wrapped too tight.   If your hematoma is on a lower extremity and is painful, crutches may be helpful for a couple days.   Only take over-the-counter or prescription medicines as directed by your health care provider. SEEK IMMEDIATE MEDICAL CARE IF:   You have increasing pain, or your pain is not controlled with medicine.   You have a fever.   You have worsening swelling or discoloration.   Your skin over the hematoma breaks or starts bleeding.   Your hematoma is in your chest or abdomen and you have weakness, shortness of breath, or a change in consciousness.  Your hematoma is on your scalp (caused  by a fall or injury) and you have a worsening headache or a change in alertness or consciousness. MAKE SURE YOU:   Understand these instructions.  Will watch your condition.  Will get help right away if you are not doing well or get worse. Document Released: 10/11/2003 Document Revised: 10/29/2012 Document Reviewed: 08/06/2012 [facility] Patient Information 2015 [place], [place]. This information is not intended to replace advice given to you by your health care provider. Make sure you discuss any questions you have with your health care provider.

## 2014-02-05 NOTE — ED Notes (Signed)
Pt states that he is ready to go home.

## 2014-02-05 NOTE — ED Notes (Signed)
Pt reports falling through attic flooring on Wednesday. Pt reports he did not fall through the entire flooring, but got stopped.  Pt c/o right abdominal pain radiating into right back with significant bruising noted.  Pt also reports hitting a 2x8 board and had swelling to the right upper side of head. Pt currently does not have any swelling, but does have a small abrasion with no bleeding.  Pt started antibiotics on Tuesday for respiratory infection .

## 2014-02-14 NOTE — ED Provider Notes (Signed)
CSN: [id number]     Arrival date & time 02/05/14  1450 History   First MD Initiated Contact with Patient 02/05/14 1513     Chief Complaint  Patient presents with  . Fall    The patient said he fell through the roof last night while he was hanging Christmas decorations.  He advised me that he thought he had just gotten the wind knocked out of him.     (Consider location/radiation/quality/duration/timing/severity/associated sxs/prior Treatment) HPI Comments: Pt comes in post fall. Pt has hx of CAD, not on any anticoagulants. Pt had a fall on Wednesday, 2 days ago. Pt was walking on his ATTIC, when the dry wall gave out, and pt ended up essentially hanging from the attic. Pt started having right sided pain post fall, pain is worse with inspiration. Pt also has noted some bruising around the R flank region. There is no dizziness, shortness of breath, near syncope. Pt has no headache and denies nausea, vomiting, visual complains, seizures, altered mental status, loss of consciousness, new weakness, or numbness, no gait instability.   Patient is a 73 y.o. male presenting with fall. The history is provided by the patient.  Fall Associated symptoms include chest pain and abdominal pain. Pertinent negatives include no shortness of breath.    Past Medical History  Diagnosis Date  . Arthritis   . Spastic colon   . Chronic sinusitis   . Coronary artery disease   . Hypertension   . Depression   . Difficult intubation     was told with shoulder done 2006-alittle narrow  . Jejunostomy tube fell out   . Fall against sharp object     stiches in head  . Headache 01/12/2014   Past Surgical History  Procedure Laterality Date  . Uvulopalatopharyngoplasty    . 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 Nasal sinus surgery    . Shoulder arthroscopy  03/01/2011    Procedure: ARTHROSCOPY SHOULDER;  Surgeon: Ninetta Lights, MD;  Location: [place];  Service: Orthopedics;   Laterality: Right;  arthroscopy shoulder decompression subacromial partial acromioplasty with coracoacromial release, distal claviculectomy, debridement of labrium  . Foot neuroma surgery  2002  . Shoulder arthroscopy  03/01/2011    Procedure: ARTHROSCOPY SHOULDER;  Surgeon: Ninetta Lights, MD;  Location: [place];  Service: Orthopedics;  Laterality: Right;  arthroscopy shoulder decompression subacromial partial acromioplasty with coracoacromial release, distal claviculectomy, debridement of labrium  . Total shoulder arthroplasty  06/28/2011    Procedure: TOTAL SHOULDER ARTHROPLASTY;  Surgeon: Ninetta Lights, MD;  Location: [place];  Service: Orthopedics;  Laterality: Right;  . Cardiac catheterization  10/2008  . Cardiac catheterization  4/13    20% LM, chronic occluded mid LAD, 50% ostial LCX, 20% mid RI, RCA with collaterals to mid LAD, mid 10% stenosis, EF 60% 06/14/11  . Total knee arthroplasty Right 09/23/2013    Procedure: TOTAL KNEE ARTHROPLASTY;  Surgeon: Ninetta Lights, MD;  Location: [place];  Service: Orthopedics;  Laterality: Right;   Family History  Problem Relation Age of Onset  . Cancer Mother   . Heart attack Father   . Migraines Brother    History  Substance Use Topics  . Smoking status: Former Smoker    Quit date: 02/27/1998  . Smokeless tobacco: Never Used  . Alcohol Use: Yes     Comment: daily    Review of Systems  Constitutional: Negative for activity change  and appetite change.  Respiratory: Negative for cough and shortness of breath.   Cardiovascular: Positive for chest pain.  Gastrointestinal: Positive for abdominal pain.  Genitourinary: Negative for dysuria.  Musculoskeletal: Negative for neck pain and neck stiffness.  Skin: Positive for rash.  Allergic/Immunologic: Negative for immunocompromised state.  Neurological: Negative for dizziness.  Hematological: Does not bruise/bleed easily.      Allergies  Tetanus toxoid;  Fish oil; Glucosamine; Ibuprofen; and Naproxen  Home Medications   Prior to Admission medications   Medication Sig Start Date End Date Taking? Authorizing Provider  acetaminophen (TYLENOL) 325 MG tablet Take 650 mg by mouth every 6 (six) hours as needed for moderate pain.   Yes Historical Provider, MD  aspirin EC 81 MG tablet Take 81 mg by mouth daily.   Yes Historical Provider, MD  carvedilol (COREG) 3.125 MG tablet Take 3.125 mg by mouth 2 (two) times daily with a meal.   Yes Historical Provider, MD  chlorpheniramine-HYDROcodone (TUSSIONEX PENNKINETIC ER) 10-8 MG/5ML LQCR Take 5 mLs by mouth at bedtime as needed. 02/01/14 03/03/14 Yes Historical Provider, MD  meclizine (ANTIVERT) 25 MG tablet Take 25 mg by mouth as needed. 12/28/13 12/28/14 Yes Historical Provider, MD  oxyCODONE-acetaminophen (ROXICET) 5-325 MG per tablet Take one tablet by mouth every 4 hours as needed for moderate pain; Take two tablets by mouth every 4 hours as needed for severe pain 09/28/13  Yes Tiffany L Reed, DO  PARoxetine (PAXIL) 20 MG tablet Take 20 mg by mouth every evening.     Yes Historical Provider, MD  polycarbophil (FIBERCON) 625 MG tablet Take 625 mg by mouth daily.     Yes Historical Provider, MD  rosuvastatin (CRESTOR) 5 MG tablet Take 5 mg by mouth every morning.    Yes Historical Provider, MD  topiramate (TOPAMAX) 25 MG tablet One tablet at night for 2 weeks, then take 2 tablets at night 01/13/14  Yes Kathrynn Ducking, MD  HYDROcodone-acetaminophen (NORCO/VICODIN) 5-325 MG per tablet Take 1 tablet by mouth every 6 (six) hours as needed. 02/05/14   Varney Biles, MD  methocarbamol (ROBAXIN) 500 MG tablet Take 1 tablet (500 mg total) by mouth 2 (two) times daily. 02/05/14   Arvle Grabe, MD   BP 148/87 mmHg  Pulse 76  Temp(Src) 97.9 F (36.6 C) (Oral)  Resp 20  Ht 5\' 10"  (1.778 m)  Wt 202 lb (91.627 kg)  BMI 28.98 kg/m2  SpO2 96% Physical Exam  Constitutional: He is oriented to person, place, and  time. He appears well-developed.  HENT:  Head: Normocephalic and atraumatic.  Eyes: Conjunctivae and EOM are normal. Pupils are equal, round, and reactive to light.  Neck: Normal range of motion. Neck supple.  Cardiovascular: Normal rate and regular rhythm.   Pulmonary/Chest: Effort normal and breath sounds normal. He exhibits tenderness.  Reproducible lower right chest wall tenderness with palpation  Abdominal: Soft. Bowel sounds are normal. He exhibits no distension. There is tenderness. There is no rebound and no guarding.  Right flank ecchymoses. No rebound or guarding.  Neurological: He is alert and oriented to person, place, and time.  Skin: Skin is warm.  Nursing note and vitals reviewed.   ED Course  Procedures (including critical care time) Labs Review Labs Reviewed  COMPREHENSIVE METABOLIC PANEL - Abnormal; Notable for the following:    Glucose, Bld 108 (*)    GFR calc non Af Amer 80 (*)    All other components within normal limits  URINALYSIS, ROUTINE W REFLEX  MICROSCOPIC - Abnormal; Notable for the following:    Color, Urine AMBER (*)    All other components within normal limits  CBC WITH DIFFERENTIAL  LIPASE, BLOOD    Imaging Review No results found.   EKG Interpretation   Date/Time:  Friday February 05 2014 15:15:43 EST Ventricular Rate:  82 PR Interval:  175 QRS Duration: 79 QT Interval:  368 QTC Calculation: 430 R Axis:   3 Text Interpretation:  Sinus rhythm Probable left atrial enlargement Low  voltage, precordial leads since last tracing no significant change  Confirmed by BELFI  MD, MELANIE ([id number]) on 02/05/2014 3:20:51 PM      MDM   Final diagnoses:  Fall  Right rib fracture, closed, initial encounter  Hematoma  Contusion    Pt comes in with cc of fall. PT has chest pain and CXR confirms a right sided rib fx. Pain control started, IS  Provided.  Pt also has ecchymoses to the Rt flank region. There are no peritoneal findings and no  clinical sx of internal bleeding. Pt's Hb is stable, UA is clear - no indication for CT abd - as pt is now post 2 days from the initial insult, and the findings are highly unlikely to be clinically significant. Return precautions discussed, and pt will comeback if he has increased pain, bloody urine, near syncope, dizziness.  Varney Biles, MD 02/14/14 2006

## 2014-02-18 ENCOUNTER — Encounter (HOSPITAL_COMMUNITY): Payer: Self-pay | Admitting: Cardiology

## 2014-05-18 ENCOUNTER — Ambulatory Visit: Payer: Medicare Other | Admitting: Neurology

## 2014-05-19 DIAGNOSIS — N400 Enlarged prostate without lower urinary tract symptoms: Secondary | ICD-10-CM | POA: Insufficient documentation

## 2014-05-28 ENCOUNTER — Ambulatory Visit (INDEPENDENT_AMBULATORY_CARE_PROVIDER_SITE_OTHER): Payer: Medicare Other | Admitting: Neurology

## 2014-05-28 ENCOUNTER — Encounter: Payer: Self-pay | Admitting: Neurology

## 2014-05-28 VITALS — BP 153/96 | HR 96 | Ht 70.0 in | Wt 216.6 lb

## 2014-05-28 DIAGNOSIS — I6502 Occlusion and stenosis of left vertebral artery: Secondary | ICD-10-CM

## 2014-05-28 DIAGNOSIS — G441 Vascular headache, not elsewhere classified: Secondary | ICD-10-CM

## 2014-05-28 DIAGNOSIS — H8110 Benign paroxysmal vertigo, unspecified ear: Secondary | ICD-10-CM | POA: Diagnosis not present

## 2014-05-28 DIAGNOSIS — I6509 Occlusion and stenosis of unspecified vertebral artery: Secondary | ICD-10-CM | POA: Insufficient documentation

## 2014-05-28 DIAGNOSIS — R269 Unspecified abnormalities of gait and mobility: Secondary | ICD-10-CM

## 2014-05-28 HISTORY — DX: Occlusion and stenosis of unspecified vertebral artery: I65.09

## 2014-05-28 HISTORY — DX: Unspecified abnormalities of gait and mobility: R26.9

## 2014-05-28 NOTE — Progress Notes (Signed)
Reason for visit: Dizziness  George Vega is an 74 y.o. 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 The patient has had some issues with intermittent dizziness/vertigo that may occur with associated headache. The patient will have intermittent episodes of headache and dizziness that may occur once every 3 months or so. The patient has done quite well since last seen, he has not had any recurrence of the vertigo. He was placed on Topamax, but he only took the medication for 2 days. He has not required any medical therapy whatsoever for the vertigo. The patient denies any new symptoms of numbness, weakness, slurred speech, double vision, headache, or gait instability. The patient has some discomfort and stiffness of the right knee following a total knee replacement. He does have some mild gait instability at baseline.  Past Medical History  Diagnosis Date  . Arthritis   . Spastic colon   . Chronic sinusitis   . Coronary artery disease   . Hypertension   . Depression   . Difficult intubation     was told with shoulder done 2006-alittle narrow  . Jejunostomy tube fell out   . Fall against sharp object     stiches in head  . Headache 01/12/2014  . 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 Gait disorder 05/28/2014    Past Surgical History  Procedure Laterality Date  . Uvulopalatopharyngoplasty    . 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 Nasal sinus surgery    . Shoulder arthroscopy  03/01/2011    Procedure: ARTHROSCOPY SHOULDER;  Surgeon: Ninetta Lights, MD;  Location: [place];  Service: Orthopedics;  Laterality: Right;  arthroscopy shoulder decompression subacromial partial acromioplasty with coracoacromial release, distal claviculectomy, debridement of labrium  . Foot neuroma surgery  2002  . Shoulder  arthroscopy  03/01/2011    Procedure: ARTHROSCOPY SHOULDER;  Surgeon: Ninetta Lights, MD;  Location: [place];  Service: Orthopedics;  Laterality: Right;  arthroscopy shoulder decompression subacromial partial acromioplasty with coracoacromial release, distal claviculectomy, debridement of labrium  . Total shoulder arthroplasty  06/28/2011    Procedure: TOTAL SHOULDER ARTHROPLASTY;  Surgeon: Ninetta Lights, MD;  Location: [place];  Service: Orthopedics;  Laterality: Right;  . Cardiac catheterization  10/2008  . Cardiac catheterization  4/13    20% LM, chronic occluded mid LAD, 50% ostial LCX, 20% mid RI, RCA with collaterals to mid LAD, mid 10% stenosis, EF 60% 06/14/11  . Total knee arthroplasty Right 09/23/2013    Procedure: TOTAL KNEE ARTHROPLASTY;  Surgeon: Ninetta Lights, MD;  Location: [place];  Service: Orthopedics;  Laterality: Right;  . Left heart catheterization with coronary angiogram Right 06/14/2011    Procedure: LEFT HEART CATHETERIZATION WITH CORONARY ANGIOGRAM;  Surgeon: Laverda Page, MD;  Location: [facility];  Service: Cardiovascular;  Laterality: Right;    Family History  Problem Relation Age of Onset  . Cancer Mother   . Heart attack Father   . Migraines Brother     Social history:  reports that he quit smoking about 16 years ago. He has never used smokeless tobacco. He reports that he drinks alcohol. He reports that he does not use illicit drugs.    Allergies  Allergen Reactions  . Tetanus Toxoid Anaphylaxis and Hives  REACTION: hives/throat swells shut  . Fish Oil Other (See Comments)    cramping  . Glucosamine Other (See Comments)    cramping  . Ibuprofen Nausea Only and Other (See Comments)    Stomach pains  . Naproxen Diarrhea    Medications:  Prior to Admission medications   Medication Sig Start Date End Date Taking? Authorizing Provider  acetaminophen (TYLENOL) 325 MG tablet Take 650 mg by mouth every 6 (six)  hours as needed for moderate pain.   Yes Historical Provider, MD  aspirin EC 81 MG tablet Take 81 mg by mouth daily.   Yes Historical Provider, MD  carvedilol (COREG) 3.125 MG tablet Take 3.125 mg by mouth 2 (two) times daily with a meal.   Yes Historical Provider, MD  meclizine (ANTIVERT) 25 MG tablet Take 25 mg by mouth as needed. 12/28/13 12/28/14 Yes Historical Provider, MD  PARoxetine (PAXIL) 20 MG tablet Take 20 mg by mouth every evening.     Yes Historical Provider, MD  polycarbophil (FIBERCON) 625 MG tablet Take 625 mg by mouth daily.     Yes Historical Provider, MD  rosuvastatin (CRESTOR) 5 MG tablet Take 5 mg by mouth every morning.    Yes Historical Provider, MD  tamsulosin (FLOMAX) 0.4 MG CAPS capsule Take 0.4 mg by mouth daily. 05/18/14  Yes Historical Provider, MD    ROS:  Out of a complete 14 system review of symptoms, the patient complains only of the following symptoms, and all other reviewed systems are negative.  Hearing loss Frequency of urination Snoring  Blood pressure 153/96, pulse 96, height 5\' 10"  (1.778 m), weight 216 lb 9.6 oz (98.249 kg).  Physical Exam  General: The patient is alert and cooperative at the time of the examination.  Skin: No significant peripheral edema is noted.   Neurologic Exam  Mental status: The patient is alert and oriented x 3 at the time of the examination. The patient has apparent normal recent and remote memory, with an apparently normal attention span and concentration ability.   Cranial nerves: Facial symmetry is present. Speech is normal, no aphasia or dysarthria is noted. Extraocular movements are full. Visual fields are full.  Motor: The patient has good strength in all 4 extremities.  Sensory examination: Soft touch sensation is symmetric on the face, arms, and legs.  Coordination: The patient has good finger-nose-finger and heel-to-shin bilaterally.  Gait and station: The patient has a slightly wide-based gait. Tandem  gait is unsteady. Romberg is negative. No drift is seen.  Reflexes: Deep tendon reflexes are symmetric, but are depressed.   Assessment/Plan:  1. Episodic vertigo, headache  2. Gait instability  3. Cerebrovascular disease, left vertebral artery occlusion  The patient has had good improvement in the vertigo and headache. He has a mild chronic gait disorder that appears to be relatively stable at this point. This may be related to the cerebrovascular disease. The patient does not require medical therapy at this point, he will follow-up through this office if needed. If the vertigo and headache return, he may be given another trial on Topamax.  Jill Alexanders MD 05/28/2014 3:47 PM  [facility] [address] [place], [place] [address]  Phone [phone] Fax [phone]

## 2014-05-28 NOTE — Patient Instructions (Signed)

## 2014-10-07 ENCOUNTER — Other Ambulatory Visit (HOSPITAL_COMMUNITY): Payer: Self-pay | Admitting: Interventional Radiology

## 2014-10-07 DIAGNOSIS — I6529 Occlusion and stenosis of unspecified carotid artery: Secondary | ICD-10-CM

## 2014-10-07 DIAGNOSIS — I771 Stricture of artery: Secondary | ICD-10-CM

## 2014-10-20 ENCOUNTER — Ambulatory Visit (HOSPITAL_COMMUNITY): Admission: RE | Admit: 2014-10-20 | Payer: Medicare Other | Source: Ambulatory Visit

## 2014-10-20 ENCOUNTER — Ambulatory Visit (HOSPITAL_COMMUNITY)
Admission: RE | Admit: 2014-10-20 | Discharge: 2014-10-20 | Disposition: A | Discharge status: Home / Self Care | Payer: Medicare Other | Source: Ambulatory Visit | Attending: Interventional Radiology | Admitting: Interventional Radiology

## 2014-10-20 DIAGNOSIS — I6502 Occlusion and stenosis of left vertebral artery: Secondary | ICD-10-CM | POA: Diagnosis present

## 2014-10-20 DIAGNOSIS — Z8673 Personal history of transient ischemic attack (TIA), and cerebral infarction without residual deficits: Secondary | ICD-10-CM | POA: Insufficient documentation

## 2014-10-20 DIAGNOSIS — I771 Stricture of artery: Secondary | ICD-10-CM

## 2014-10-20 DIAGNOSIS — I6529 Occlusion and stenosis of unspecified carotid artery: Secondary | ICD-10-CM | POA: Insufficient documentation

## 2014-10-20 LAB — CREATININE, SERUM
Creatinine, Ser: 1.09 mg/dL (ref 0.61–1.24)
GFR calc Af Amer: >60 mL/min (ref >60)

## 2014-10-20 MED ORDER — GADOBENATE DIMEGLUMINE 529 MG/ML IV SOLN
20.0000 mL | Freq: Once | INTRAVENOUS | Status: AC | PRN
  Administered 2014-10-20: 20 mL via INTRAVENOUS

## 2014-10-26 ENCOUNTER — Telehealth (HOSPITAL_COMMUNITY): Payer: Self-pay

## 2014-10-26 NOTE — Telephone Encounter (Signed)
Called pt to let him know that Dr. Estanislado Pandy wants him to f/u with a MRI/MRA in one year. Pt was in agreement with this plan. AW

## 2015-03-13 HISTORY — PX: ESOPHAGEAL DILATION: SHX303

## 2015-03-25 ENCOUNTER — Encounter: Payer: Self-pay | Admitting: Gastroenterology

## 2015-09-08 ENCOUNTER — Observation Stay (HOSPITAL_COMMUNITY)
Admission: EM | Admit: 2015-09-08 | Discharge: 2015-09-09 | Disposition: A | Discharge status: Home / Self Care | Payer: Medicare Other | Attending: Cardiology | Admitting: Cardiology

## 2015-09-08 ENCOUNTER — Encounter (HOSPITAL_COMMUNITY): Payer: Self-pay

## 2015-09-08 DIAGNOSIS — I483 Typical atrial flutter: Secondary | ICD-10-CM

## 2015-09-08 DIAGNOSIS — I484 Atypical atrial flutter: Secondary | ICD-10-CM | POA: Diagnosis not present

## 2015-09-08 DIAGNOSIS — R000 Tachycardia, unspecified: Secondary | ICD-10-CM | POA: Diagnosis present

## 2015-09-08 DIAGNOSIS — Z96651 Presence of right artificial knee joint: Secondary | ICD-10-CM | POA: Diagnosis not present

## 2015-09-08 DIAGNOSIS — Z87891 Personal history of nicotine dependence: Secondary | ICD-10-CM | POA: Insufficient documentation

## 2015-09-08 DIAGNOSIS — Z96612 Presence of left artificial shoulder joint: Secondary | ICD-10-CM | POA: Diagnosis not present

## 2015-09-08 DIAGNOSIS — I10 Essential (primary) hypertension: Secondary | ICD-10-CM | POA: Insufficient documentation

## 2015-09-08 DIAGNOSIS — Z7982 Long term (current) use of aspirin: Secondary | ICD-10-CM | POA: Insufficient documentation

## 2015-09-08 DIAGNOSIS — I4719 Other supraventricular tachycardia: Secondary | ICD-10-CM | POA: Diagnosis present

## 2015-09-08 DIAGNOSIS — I2510 Atherosclerotic heart disease of native coronary artery without angina pectoris: Secondary | ICD-10-CM | POA: Insufficient documentation

## 2015-09-08 DIAGNOSIS — Z96611 Presence of right artificial shoulder joint: Secondary | ICD-10-CM | POA: Insufficient documentation

## 2015-09-08 DIAGNOSIS — I471 Supraventricular tachycardia: Secondary | ICD-10-CM | POA: Diagnosis present

## 2015-09-08 LAB — COMPREHENSIVE METABOLIC PANEL
ALK PHOS: 90 U/L (ref 38–126)
ALT: 48 U/L (ref 17–63)
AST: 61 U/L — ABNORMAL HIGH (ref 15–41)
Albumin: 3.4 g/dL — ABNORMAL LOW (ref 3.5–5.0)
Anion gap: 5 (ref 5–15)
BILIRUBIN TOTAL: 1.3 mg/dL — AB (ref 0.3–1.2)
BUN: 11 mg/dL (ref 6–20)
CALCIUM: 8.8 mg/dL — AB (ref 8.9–10.3)
CO2: 26 mmol/L (ref 22–32)
CREATININE: 0.92 mg/dL (ref 0.61–1.24)
Chloride: 104 mmol/L (ref 101–111)
Glucose, Bld: 105 mg/dL — ABNORMAL HIGH (ref 65–99)
Potassium: 4.6 mmol/L (ref 3.5–5.1)
Sodium: 135 mmol/L (ref 135–145)
TOTAL PROTEIN: 6.1 g/dL — AB (ref 6.5–8.1)

## 2015-09-08 LAB — CBC WITH DIFFERENTIAL/PLATELET
Basophils Absolute: 0.1 10*3/uL (ref 0.0–0.1)
Basophils Relative: 1 %
EOS ABS: 0.4 10*3/uL (ref 0.0–0.7)
Eosinophils Relative: 6 %
HEMATOCRIT: 47 % (ref 39.0–52.0)
HEMOGLOBIN: 15.6 g/dL (ref 13.0–17.0)
LYMPHS ABS: 1.9 10*3/uL (ref 0.7–4.0)
Lymphocytes Relative: 25 %
MCH: 31.8 pg (ref 26.0–34.0)
MCHC: 33.2 g/dL (ref 30.0–36.0)
MCV: 95.7 fL (ref 78.0–100.0)
MONOS PCT: 10 %
Monocytes Absolute: 0.7 10*3/uL (ref 0.1–1.0)
Neutro Abs: 4.4 10*3/uL (ref 1.7–7.7)
Neutrophils Relative %: 58 %
Platelets: 219 10*3/uL (ref 150–400)
RBC: 4.91 MIL/uL (ref 4.22–5.81)
RDW: 13 % (ref 11.5–15.5)
WBC: 7.6 10*3/uL (ref 4.0–10.5)

## 2015-09-08 LAB — TSH: TSH: 1.638 u[IU]/mL (ref 0.350–4.500)

## 2015-09-08 MED ORDER — ADENOSINE 6 MG/2ML IV SOLN
6.0000 mg | Freq: Once | INTRAVENOUS | Status: AC
  Administered 2015-09-08: 6 mg via INTRAVENOUS
  Filled 2015-09-08: qty 2

## 2015-09-08 MED ORDER — ADENOSINE 6 MG/2ML IV SOLN
12.0000 mg | Freq: Once | INTRAVENOUS | Status: AC
  Administered 2015-09-08: 12 mg via INTRAVENOUS
  Filled 2015-09-08: qty 4

## 2015-09-08 MED ORDER — SOTALOL HCL 120 MG PO TABS
120.0000 mg | ORAL_TABLET | Freq: Two times a day (BID) | ORAL | Status: DC
  Administered 2015-09-08 – 2015-09-09 (×2): 120 mg via ORAL
  Filled 2015-09-08 (×3): qty 1

## 2015-09-08 MED ORDER — DILTIAZEM LOAD VIA INFUSION
20.0000 mg | Freq: Once | INTRAVENOUS | Status: AC
  Administered 2015-09-08: 20 mg via INTRAVENOUS
  Filled 2015-09-08: qty 20

## 2015-09-08 MED ORDER — ROSUVASTATIN CALCIUM 5 MG PO TABS
5.0000 mg | ORAL_TABLET | Freq: Every day | ORAL | Status: DC
  Administered 2015-09-08: 5 mg via ORAL
  Filled 2015-09-08 (×2): qty 1

## 2015-09-08 MED ORDER — DIAZEPAM 5 MG PO TABS
5.0000 mg | ORAL_TABLET | Freq: Once | ORAL | Status: AC
  Administered 2015-09-08: 5 mg via ORAL
  Filled 2015-09-08: qty 1

## 2015-09-08 MED ORDER — DILTIAZEM HCL 25 MG/5ML IV SOLN
25.0000 mg | Freq: Once | INTRAVENOUS | Status: AC
  Administered 2015-09-08: 25 mg via INTRAVENOUS

## 2015-09-08 MED ORDER — RIVAROXABAN 20 MG PO TABS
20.0000 mg | ORAL_TABLET | Freq: Every day | ORAL | Status: DC
  Administered 2015-09-08: 20 mg via ORAL
  Filled 2015-09-08 (×2): qty 1

## 2015-09-08 MED ORDER — METOPROLOL SUCCINATE ER 25 MG PO TB24: 50.0000 mg | ORAL_TABLET | Freq: Every day | ORAL | Status: DC

## 2015-09-08 MED ORDER — PAROXETINE HCL 20 MG PO TABS
20.0000 mg | ORAL_TABLET | Freq: Every day | ORAL | Status: DC
  Administered 2015-09-08: 20 mg via ORAL
  Filled 2015-09-08 (×2): qty 1

## 2015-09-08 MED ORDER — ADENOSINE 6 MG/2ML IV SOLN
INTRAVENOUS | Status: AC | PRN
  Administered 2015-09-08: 6 mg via INTRAVENOUS

## 2015-09-08 MED ORDER — DILTIAZEM HCL 100 MG IV SOLR
5.0000 mg/h | INTRAVENOUS | Status: DC
  Administered 2015-09-08: 5 mg/h via INTRAVENOUS
  Administered 2015-09-08: 10 mg/h via INTRAVENOUS
  Administered 2015-09-08: 15 mg/h via INTRAVENOUS
  Administered 2015-09-09: 10 mg/h via INTRAVENOUS
  Filled 2015-09-08 (×3): qty 100

## 2015-09-08 MED ORDER — SODIUM CHLORIDE 0.9% FLUSH
3.0000 mL | Freq: Two times a day (BID) | INTRAVENOUS | Status: DC
  Administered 2015-09-08: 3 mL via INTRAVENOUS

## 2015-09-08 NOTE — ED Notes (Signed)
Per Pt, Pt is coming from [facility]. Pt was to have an endoscopy today when they noted SVT on the EKG. Pt denies any chest pain, nausea, or vomiting. Reports slight dizziness. Pt Alert and Oriented x4 upon arrival.

## 2015-09-08 NOTE — Progress Notes (Signed)
ANTICOAGULATION CONSULT NOTE - Initial Consult  Pharmacy Consult for Xarelto Indication: atrial fibrillation  Allergies  Allergen Reactions  . Tetanus Toxoid Anaphylaxis and Hives    REACTION: hives/throat swells shut  . Fish Oil Other (See Comments)    cramping  . Glucosamine Other (See Comments)    cramping  . Ibuprofen Nausea Only and Other (See Comments)    Stomach pains  . Naproxen Diarrhea    Patient Measurements: Height: '5\' 10"'$  (177.8 cm) Weight: 213 lb 11.2 oz (96.934 kg) IBW/kg (Calculated) : 73  Vital Signs: Temp: 97.7 F (36.5 C) (06/29 1245) Temp Source: Oral (06/29 1245) BP: 122/93 mmHg (06/29 1430) Pulse Rate: 152 (06/29 1430)  Labs:  Recent Labs  09/08/15 1325  HGB 15.6  HCT 47.0  PLT 219  CREATININE 0.92    Estimated Creatinine Clearance: 82.3 mL/min (by C-G formula based on Cr of 0.92).   Medical History: Past Medical History  Diagnosis Date  . Arthritis   . Spastic colon   . Chronic sinusitis   . Coronary artery disease   . Hypertension   . Depression   . Difficult intubation     was told with shoulder done 2006-alittle narrow  . Jejunostomy tube fell out ([place])   . Fall against sharp object     stiches in head  . Headache 01/12/2014  . 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 metoprolol succinate  50 mg Oral Daily  . PARoxetine  20 mg Oral Daily  . rosuvastatin  5 mg Oral q1800  . sodium chloride flush  3 mL Intravenous Q12H   Infusions:  . diltiazem (CARDIZEM) infusion 10 mg/hr (09/08/15 1514)    Assessment: 75yo male with history of CAD, HTN and depression presents with tachycardia. Pharmacy is consulted to dose xarelto for atrial fibrillation. CBC wnl, sCr 0.92.  Goal of Therapy:  Monitor platelets by anticoagulation protocol: Yes   Plan:  Xarelto '20mg'$  PO daily Monitor s/sx of bleeding Educate pt on  xarelto  Teneka Malmberg M. Diona Foley, PharmD, [place] Clinical Pharmacist Pager [phone] 09/08/2015,5:10 PM

## 2015-09-08 NOTE — ED Provider Notes (Addendum)
CSN: [id number]     Arrival date & time 09/08/15  1241 History   First MD Initiated Contact with Patient 09/08/15 1253     Chief Complaint  Patient presents with  . Tachycardia     (Consider location/radiation/quality/duration/timing/severity/associated sxs/prior Treatment) Patient is a 75 y.o. male presenting with palpitations.  Palpitations Palpitations quality:  Regular Onset quality:  Gradual Timing:  Constant Chronicity:  New Context: not anxiety, not caffeine and not dehydration   Relieved by:  None tried Worsened by:  Nothing Ineffective treatments:  None tried Associated symptoms: no back pain, no leg pain, no lower extremity edema, no malaise/fatigue and no nausea   Risk factors: no diabetes mellitus     Past Medical History  Diagnosis Date  . Arthritis   . Spastic colon   . Chronic sinusitis   . Coronary artery disease   . Hypertension   . Depression   . Difficult intubation     was told with shoulder done 2006-alittle narrow  . Jejunostomy tube fell out ([place])   . Fall against sharp object     stiches in head  . Headache 01/12/2014  . 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 Gait disorder 05/28/2014   Past Surgical History  Procedure Laterality Date  . Uvulopalatopharyngoplasty    . 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 Nasal sinus surgery    . Shoulder arthroscopy  03/01/2011    Procedure: ARTHROSCOPY SHOULDER;  Surgeon: Ninetta Lights, MD;  Location: [place];  Service: Orthopedics;  Laterality: Right;  arthroscopy shoulder decompression subacromial partial acromioplasty with coracoacromial release, distal claviculectomy, debridement of labrium  . Foot neuroma surgery  2002  . Shoulder arthroscopy  03/01/2011    Procedure: ARTHROSCOPY SHOULDER;  Surgeon: Ninetta Lights, MD;  Location: [place];  Service: Orthopedics;  Laterality: Right;  arthroscopy shoulder decompression subacromial partial  acromioplasty with coracoacromial release, distal claviculectomy, debridement of labrium  . Total shoulder arthroplasty  06/28/2011    Procedure: TOTAL SHOULDER ARTHROPLASTY;  Surgeon: Ninetta Lights, MD;  Location: [place];  Service: Orthopedics;  Laterality: Right;  . Cardiac catheterization  10/2008  . Cardiac catheterization  4/13    20% LM, chronic occluded mid LAD, 50% ostial LCX, 20% mid RI, RCA with collaterals to mid LAD, mid 10% stenosis, EF 60% 06/14/11  . Total knee arthroplasty Right 09/23/2013    Procedure: TOTAL KNEE ARTHROPLASTY;  Surgeon: Ninetta Lights, MD;  Location: [place];  Service: Orthopedics;  Laterality: Right;  . Left heart catheterization with coronary angiogram Right 06/14/2011    Procedure: LEFT HEART CATHETERIZATION WITH CORONARY ANGIOGRAM;  Surgeon: Laverda Page, MD;  Location: [facility] CATH LAB;  Service: Cardiovascular;  Laterality: Right;   Family History  Problem Relation Age of Onset  . Cancer Mother   . Heart attack Father   . Migraines Brother    Social History  Substance Use Topics  . Smoking status: Former Smoker    Quit date: 02/27/1998  . Smokeless tobacco: Never Used  . Alcohol Use: 8.4 oz/week    14 Standard drinks or equivalent per week     Comment: daily    Review of Systems  Constitutional: Positive for fatigue. Negative for malaise/fatigue.  Cardiovascular: Positive for palpitations.  Gastrointestinal: Negative for nausea.  Musculoskeletal: Negative for back pain.  All other systems reviewed and are negative.     Allergies  Tetanus toxoid; Fish oil; Glucosamine; Ibuprofen; and Naproxen  Home Medications   Prior to Admission medications   Medication Sig Start Date End Date Taking? Authorizing Provider  acetaminophen (TYLENOL) 325 MG tablet Take 650 mg by mouth every 6 (six) hours as needed for moderate pain.   Yes Historical Provider, MD  aspirin EC 81 MG tablet Take 81 mg by mouth daily.   Yes Historical  Provider, MD  carvedilol (COREG) 3.125 MG tablet Take 3.125 mg by mouth 2 (two) times daily with a meal.   Yes Historical Provider, MD  Cholecalciferol (VITAMIN D3) 5000 units TABS Take 5,000 Units by mouth daily.   Yes Historical Provider, MD  Cyanocobalamin (VITAMIN B-12) 500 MCG SUBL Place 1 tablet under the tongue 3 (three) times a week.   Yes Historical Provider, MD  Melatonin 10 MG TABS Take 10 mg by mouth daily.   Yes Historical Provider, MD  PARoxetine (PAXIL) 20 MG tablet Take 20 mg by mouth every evening.     Yes Historical Provider, MD  polycarbophil (FIBERCON) 625 MG tablet Take 625 mg by mouth daily.     Yes Historical Provider, MD  rosuvastatin (CRESTOR) 5 MG tablet Take 5 mg by mouth every morning.    Yes Historical Provider, MD   BP 122/93 mmHg  Pulse 152  Temp(Src) 97.7 F (36.5 C) (Oral)  Resp 20  Ht '5\' 10"'$  (1.778 m)  Wt 213 lb 11.2 oz (96.934 kg)  BMI 30.66 kg/m2  SpO2 96% Physical Exam  Constitutional: He is oriented to person, place, and time. He appears well-developed and well-nourished.  HENT:  Head: Normocephalic and atraumatic.  Neck: Normal range of motion.  Cardiovascular: Tachycardia present.   Pulmonary/Chest: Effort normal. No respiratory distress. He has no wheezes.  Abdominal: Soft. He exhibits no distension. There is no tenderness.  Musculoskeletal: Normal range of motion. He exhibits no edema.  Neurological: He is alert and oriented to person, place, and time.  Skin: Skin is warm and dry.  Nursing note and vitals reviewed.   ED Course  Procedures (including critical care time)  CRITICAL CARE Performed by: Merrily Pew Total critical care time: 45 minutes Critical care time was exclusive of separately billable procedures and treating other patients. Critical care was necessary to treat or prevent imminent or life-threatening deterioration. Critical care was time spent personally by me on the following activities: development of treatment plan  with patient and/or surrogate as well as nursing, discussions with consultants, evaluation of patient's response to treatment, examination of patient, obtaining history from patient or surrogate, ordering and performing treatments and interventions, ordering and review of laboratory studies, ordering and review of radiographic studies, pulse oximetry and re-evaluation of patient's condition.   Labs Review Labs Reviewed  COMPREHENSIVE METABOLIC PANEL - Abnormal; Notable for the following:    Glucose, Bld 105 (*)    Calcium 8.8 (*)    Total Protein 6.1 (*)    Albumin 3.4 (*)    AST 61 (*)    Total Bilirubin 1.3 (*)    All other components within normal limits  CBC WITH DIFFERENTIAL/PLATELET    Imaging Review No results found. I have personally reviewed and evaluated these images and lab results as part of my medical decision-making.   EKG Interpretation   Date/Time:  Thursday September 08 2015 12:44:08 EDT Ventricular Rate:  150 PR Interval:    QRS Duration: 84 QT Interval:  320 QTC Calculation: 505 R Axis:   -20 Text Interpretation:  Supraventricular  tachycardia Cannot rule out  Anterior infarct , age undetermined Abnormal ECG Confirmed by [facility] MD,  Corene Cornea [phone]) on 09/08/2015 12:54:25 PM Also confirmed by [facility] MD, Corene Cornea  [phone]), editor Rolla Plate, Joelene Millin [phone])  on 09/08/2015 2:00:36 PM               MDM   Final diagnoses:  Tachycardia  Typical atrial flutter ([place])    Initially appeared to beSupraventricular tachycardia however after multiple doses of adenosine the rhythm looked to be more consistent with atrial flutter. Patient started on a diltiazem drip after a bolus and had to be titrated up to get heart rates rate controlled close to 100. Some soft blood pressures without otherwise no other evidence of instability. Patient saw Dr. Einar Gip in the office and was sent here for further evaluation. Unknown how longThe patient has been in the rhythm so we'll not attempt  electro cardioversion this time. His rate is now controlled but irregular with multiple nonconducted p-waves. I will discuss with cardiology for admission.  Spoke with cardiology who agrees with admission, requests repeat ECG.     Merrily Pew, MD 09/08/15 1641  Merrily Pew, MD 09/08/15 [phone]

## 2015-09-08 NOTE — H&P (Signed)
George Vega is an 75 y.o. male.   Chief Complaint: Tachycardia HPI: George Vega  is a 75 y.o. male with a known coronary artery disease and by cardiac catheterization in 2010 he had occluded LAD.  He has preserved left ventricular systolic function.  He underwent repeat cardiac catheterization in April 2013 and at that time was found to have similar anatomy and hence the lesion was left alone as he does not have any angina or congestive heart failure. He has had dizziness and has had postresuscitation stroke, occluded left vertebral artery bi-cerebral angiography in September 2014.  He initially presented for an EGD this morning and was noted to have tachycardia with rates in the 160s.  He was sent to our office for further evaluation and management. He was asymptomatic. He appeared to be in SVT and was given 2.5 mg IV metoprolol followed by 6 mg adenosine.  He had a brief pause and then immediately went back into SVT.  He was given another 2.5 mg IV metoprolol and 500 mL NS.  After rate did not improve below 160, he was sent to the emergency department.  He was again given multiple doses of adenosine without conversion to sinus rhythm.  He was given a Cardizem bolus and placed on a drip and rate has now improved and he is and what appears to be atrial tachycardia.  Past Medical History  Diagnosis Date  . Arthritis   . Spastic colon   . Chronic sinusitis   . Coronary artery disease   . Hypertension   . Depression   . Difficult intubation     was told with shoulder done 2006-alittle narrow  . Jejunostomy tube fell out ([place])   . Fall against sharp object     stiches in head  . Headache 01/12/2014  . 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 Gait disorder 05/28/2014    Past Surgical History  Procedure Laterality Date  . Uvulopalatopharyngoplasty    . 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 Nasal sinus surgery    . Shoulder arthroscopy  03/01/2011   Procedure: ARTHROSCOPY SHOULDER;  Surgeon: Ninetta Lights, MD;  Location: [place];  Service: Orthopedics;  Laterality: Right;  arthroscopy shoulder decompression subacromial partial acromioplasty with coracoacromial release, distal claviculectomy, debridement of labrium  . Foot neuroma surgery  2002  . Shoulder arthroscopy  03/01/2011    Procedure: ARTHROSCOPY SHOULDER;  Surgeon: Ninetta Lights, MD;  Location: [place];  Service: Orthopedics;  Laterality: Right;  arthroscopy shoulder decompression subacromial partial acromioplasty with coracoacromial release, distal claviculectomy, debridement of labrium  . Total shoulder arthroplasty  06/28/2011    Procedure: TOTAL SHOULDER ARTHROPLASTY;  Surgeon: Ninetta Lights, MD;  Location: [place];  Service: Orthopedics;  Laterality: Right;  . Cardiac catheterization  10/2008  . Cardiac catheterization  4/13    20% LM, chronic occluded mid LAD, 50% ostial LCX, 20% mid RI, RCA with collaterals to mid LAD, mid 10% stenosis, EF 60% 06/14/11  . Total knee arthroplasty Right 09/23/2013    Procedure: TOTAL KNEE ARTHROPLASTY;  Surgeon: Ninetta Lights, MD;  Location: [place];  Service: Orthopedics;  Laterality: Right;  . Left heart catheterization with coronary angiogram Right 06/14/2011    Procedure: LEFT HEART CATHETERIZATION WITH CORONARY ANGIOGRAM;  Surgeon: Laverda Page, MD;  Location: [facility] CATH LAB;  Service: Cardiovascular;  Laterality: Right;  Family History  Problem Relation Age of Onset  . Cancer Mother   . Heart attack Father   . Migraines Brother    Social History:  reports that he quit smoking about 17 years ago. He has never used smokeless tobacco. He reports that he drinks about 8.4 oz of alcohol per week. He reports that he does not use illicit drugs.  Allergies:  Allergies  Allergen Reactions  . Tetanus Toxoid Anaphylaxis and Hives    REACTION: hives/throat swells shut  . Fish Oil Other  (See Comments)    cramping  . Glucosamine Other (See Comments)    cramping  . Ibuprofen Nausea Only and Other (See Comments)    Stomach pains  . Naproxen Diarrhea    Review of Systems - History obtained from the patient General ROS: negative for - chills or fatigue Respiratory ROS: no cough, shortness of breath, or wheezing Cardiovascular ROS: no chest pain or dyspnea on exertion Neurological ROS: no TIA or stroke symptoms  Extremities: Degenerative joint disease and pain  Blood pressure 122/93, pulse 152, temperature 97.7 F (36.5 C), temperature source Oral, resp. rate 20, height _0  (1.778 m), weight 96.934 kg (213 lb 11.2 oz), SpO2 96 %. General appearance: alert, cooperative, appears stated age, no distress and mildly obese Eyes: negative Neck: no adenopathy, no carotid bruit, no JVD, supple, symmetrical, trachea midline and thyroid not enlarged, symmetric, no tenderness/mass/nodules Resp: clear to auscultation bilaterally Chest wall: no tenderness Cardio: Tachycardia present,S1 and S2 is normal.  No gallop or murmur appreciated. GI: soft, non-tender; bowel sounds normal; no masses,  no organomegaly Extremities: extremities normal, atraumatic, no cyanosis or edema Pulses: 2+ and symmetric Skin: Skin color, texture, turgor normal. No rashes or lesions Neurologic: Grossly normal  Results for orders placed or performed during the hospital encounter of 09/08/15 (from the past 48 hour(s))  CBC with Differential     Status: None   Collection Time: 09/08/15  1:25 PM  Result Value Ref Range   WBC 7.6 4.0 - 10.5 K/uL   RBC 4.91 4.22 - 5.81 MIL/uL   Hemoglobin 15.6 13.0 - 17.0 g/dL   HCT 47.0 39.0 - 52.0 %   MCV 95.7 78.0 - 100.0 fL   MCH 31.8 26.0 - 34.0 pg   MCHC 33.2 30.0 - 36.0 g/dL   RDW 13.0 11.5 - 15.5 %   Platelets 219 150 - 400 K/uL   Neutrophils Relative % 58 %   Neutro Abs 4.4 1.7 - 7.7 K/uL   Lymphocytes Relative 25 %   Lymphs Abs 1.9 0.7 - 4.0 K/uL    Monocytes Relative 10 %   Monocytes Absolute 0.7 0.1 - 1.0 K/uL   Eosinophils Relative 6 %   Eosinophils Absolute 0.4 0.0 - 0.7 K/uL   Basophils Relative 1 %   Basophils Absolute 0.1 0.0 - 0.1 K/uL  Comprehensive metabolic panel     Status: Abnormal   Collection Time: 09/08/15  1:25 PM  Result Value Ref Range   Sodium 135 135 - 145 mmol/L   Potassium 4.6 3.5 - 5.1 mmol/L    Comment: HEMOLYSIS AT THIS LEVEL MAY AFFECT RESULT   Chloride 104 101 - 111 mmol/L   CO2 26 22 - 32 mmol/L   Glucose, Bld 105 (H) 65 - 99 mg/dL   BUN 11 6 - 20 mg/dL   Creatinine, Ser 0.92 0.61 - 1.24 mg/dL   Calcium 8.8 (L) 8.9 - 10.3 mg/dL   Total Protein 6.1 (L) 6.5 -  8.1 g/dL   Albumin 3.4 (L) 3.5 - 5.0 g/dL   AST 61 (H) 15 - 41 U/L   ALT 48 17 - 63 U/L   Alkaline Phosphatase 90 38 - 126 U/L   Total Bilirubin 1.3 (H) 0.3 - 1.2 mg/dL   GFR calc non Af Amer >60 >60 mL/min   GFR calc Af Amer >60 >60 mL/min    Comment: (NOTE) The eGFR has been calculated using the CKD EPI equation. This calculation has not been validated in all clinical situations. eGFR's persistently <60 mL/min signify possible Chronic Kidney Disease.    Anion gap 5 5 - 15   No results found.  Labs:   Lab Results  Component Value Date   WBC 7.6 09/08/2015   HGB 15.6 09/08/2015   HCT 47.0 09/08/2015   MCV 95.7 09/08/2015   PLT 219 09/08/2015    Recent Labs Lab 09/08/15 1325  NA 135  K 4.6  CL 104  CO2 26  BUN 11  CREATININE 0.92  CALCIUM 8.8*  PROT 6.1*  BILITOT 1.3*  ALKPHOS 90  ALT 48  AST 61*  GLUCOSE 105*   Cardiac studies:  EKG: 12:15 PM SVT at the rate of 150 bpm the at no evidence of ischemia. 1600 EKG atrial tachycardia atrial rate 150 bpm with variable ventricular response. Normal QT interval. No evidence of ischemia.  Stress EKG 06/07/11: Positive for ischemia at low workload, exercise duration 5 min 7.3 METs. Patient had significant 2.5-3 mm ST segment depression at 4 min. into exercise and persisted  for greater than 4 min. into recovery.  Heart cath 06/14/11 : Mid LAD occluded collaterals from RCA, LM 20%, Circ 50%. RI normal. EF 60% H/O known occluded LAD by cardiac cath 2010.  Echo- 01/19/14: Left ventricle cavity is normal in size. Moderate concentric hypertrophy of the left ventricle. Normal global wall motion. Visual EF is 55-60%. Doppler evidence of grade I (impaired) diastolic dysfunction. Calculated EF 55%. Trace mitral regurgitation. Trace tricuspid regurgitation. No diagnostic change c.f. echo. of 06/05/2011    Current facility-administered medications:  .  [COMPLETED] diltiazem (CARDIZEM) 1 mg/mL load via infusion 20 mg, 20 mg, Intravenous, Once, 20 mg at 09/08/15 1442 **AND** diltiazem (CARDIZEM) 100 mg in dextrose 5 % 100 mL (1 mg/mL) infusion, 5-15 mg/hr, Intravenous, Continuous, Merrily Pew, MD, Last Rate: 10 mL/hr at 09/08/15 1514, 10 mg/hr at 09/08/15 1514 .  metoprolol succinate (TOPROL-XL) 24 hr tablet 50 mg, 50 mg, Oral, Daily, Neldon Labella, NP .  PARoxetine (PAXIL) tablet 20 mg, 20 mg, Oral, Daily, Neldon Labella, NP .  rosuvastatin (CRESTOR) tablet 5 mg, 5 mg, Oral, q1800, Neldon Labella, NP .  sodium chloride flush (NS) 0.9 % injection 3 mL, 3 mL, Intravenous, Q12H, Neldon Labella, NP  Current outpatient prescriptions:  .  acetaminophen (TYLENOL) 325 MG tablet, Take 650 mg by mouth every 6 (six) hours as needed for moderate pain., Disp: , Rfl:  .  aspirin EC 81 MG tablet, Take 81 mg by mouth daily., Disp: , Rfl:  .  carvedilol (COREG) 3.125 MG tablet, Take 3.125 mg by mouth 2 (two) times daily with a meal., Disp: , Rfl:  .  Cholecalciferol (VITAMIN D3) 5000 units TABS, Take 5,000 Units by mouth daily., Disp: , Rfl:  .  Cyanocobalamin (VITAMIN B-12) 500 MCG SUBL, Place 1 tablet under the tongue 3 (three) times a week., Disp: , Rfl:  .  Melatonin 10 MG TABS, Take 10 mg by mouth daily., Disp: ,  Rfl:  .  PARoxetine (PAXIL) 20 MG tablet, Take 20 mg by mouth  every evening.  , Disp: , Rfl:  .  polycarbophil (FIBERCON) 625 MG tablet, Take 625 mg by mouth daily.  , Disp: , Rfl:  .  rosuvastatin (CRESTOR) 5 MG tablet, Take 5 mg by mouth every morning. , Disp: , Rfl:    Assessment/Plan 1.  Atrial tachycardia with variable ventricular  Response,unable to convert to sinus rhythm with intravenous adenosine. 2. 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  Has known occluded LAD which is very well collateralized with normal LV systolic function by coronary angiography in April 2013. 3. 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  Noticed dual defect. 4. Hyperlipidemia   Recommendation: Will continue IV diltiazem, due to underlying coronary artery disease and structural heart disease will start the patient on sotalol at 120 mg by mouth twice a day.  Continue to monitor him overnight, will have Dr. Crissie Sickles who I discussed the case with evaluate the patient in the morning.  Patient was given Xarelto today, but probably will not need anticoagulation due to organized atrial tachycardia with atrial rate less than 150 bpm.  I do not think myocardial ischemia is an issue, even at  Very high heart rate, went to the rate of 150 bpm, there was no evidence of ischemia by EKG. Patient also asymptomatic. I'll obtain TSH. He will need echocardiogram in the morning if he continues to persistent atrial tachycardia otherwise can be discharged home with outpatient follow-up and management   Adrian Prows, MD 09/08/2015, 5:13 PM [facility] Pager: [phone] Office: [phone]

## 2015-09-09 DIAGNOSIS — I2510 Atherosclerotic heart disease of native coronary artery without angina pectoris: Secondary | ICD-10-CM | POA: Diagnosis not present

## 2015-09-09 DIAGNOSIS — I10 Essential (primary) hypertension: Secondary | ICD-10-CM | POA: Diagnosis not present

## 2015-09-09 DIAGNOSIS — I484 Atypical atrial flutter: Secondary | ICD-10-CM | POA: Diagnosis not present

## 2015-09-09 DIAGNOSIS — Z7982 Long term (current) use of aspirin: Secondary | ICD-10-CM | POA: Diagnosis not present

## 2015-09-09 MED ORDER — SOTALOL HCL 120 MG PO TABS: 120.0000 mg | ORAL_TABLET | Freq: Two times a day (BID) | ORAL | Status: DC

## 2015-09-09 NOTE — Discharge Summary (Signed)
Physician Discharge Summary  Patient ID: George Vega MRN: [id number] DOB/AGE: 07/27/1940 75 y.o.  Admit date: 09/08/2015 Discharge date: 09/09/2015  Discharge Diagnosis 1. Atrial tachycardia with rapid ventricular response atrial rate 150 bpm. 2. 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 Has known occluded LAD which is very well collateralized with normal LV systolic function by coronary angiography in April 2013. 3. 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 Noticed dual defect. 4. Hyperlipidemia   Hospital Course: [name]  is a 75 y.o. male with a known coronary artery disease and by cardiac catheterization in 2010 he had occluded LAD.  He has preserved left ventricular systolic function.  He underwent repeat cardiac catheterization in April 2013 and at that time was found to have similar anatomy and hence the lesion was left alone as he does not have any angina or congestive heart failure. He has had dizziness and has had postresuscitation stroke, occluded left vertebral artery bi-cerebral angiography in September 2014.  He initially presented for an EGD this morning and was noted to have tachycardia with rates in the 160s.  He was sent to our office for further evaluation and management. He was asymptomatic. He appeared to be in SVT and was given 2.5 mg IV metoprolol followed by 6 mg adenosine.  He had a brief pause and then immediately went back into SVT.  He was given another 2.5 mg IV metoprolol and 500 mL NS.  After rate did not improve below 160, he was sent to the emergency department.  He was again given multiple doses of adenosine without conversion to sinus rhythm.  He was given a Cardizem bolus and placed on a drip and admitted to the hospital for further management.  After discussions with Dr. Crissie Sickles, it was felt anticoagulation was not necessary as it was clearly atrial  tachycardia and we started the patient on sotalol.  Patient converted to sinus rhythm on the day of discharge at 6 AM.  No change in the EKG, no QT prolongation, patient asymptomatic and hence felt stable for discharge with outpatient follow-up.  I will see him back in the office in couple weeks with repeat EKG and have made appointment with Dr. Crissie Sickles in the outpatient basis.  Recommendations on discharge: he'll need outpatient echo to reevaluate left ventricular systolic function.   There was no evidence of ischemia in spite of rapid ventricular response by EKG, this in itself acting like a stress test. He will continue sotalol 120 mg by mouth twice a day, carvedilol discontinued.  Discharge Exam: Blood pressure 125/84, pulse 130, temperature 98.6 F (37 C), temperature source Oral, resp. rate 19, height 5.1" (0.13 m), weight 105.597 kg (232 lb 12.8 oz), SpO2 95 %.   Eyes: negative Neck: no adenopathy, no carotid bruit, no JVD, supple, symmetrical, trachea midline and thyroid not enlarged, symmetric, no tenderness/mass/nodules Resp: clear to auscultation bilaterally Chest wall: no tenderness Cardio: S1 and S2 is normal. No gallop or murmur appreciated. GI: soft, non-tender; bowel sounds normal; no masses, no organomegaly Extremities: extremities normal, atraumatic, no cyanosis or edema Pulses: 2+ and symmetric Skin: Skin color, texture, turgor normal. No rashes or lesions Neurologic: Grossly normal  Labs:   Lab Results  Component Value Date   WBC 7.6 09/08/2015   HGB 15.6 09/08/2015   HCT 47.0 09/08/2015   MCV 95.7 09/08/2015   PLT 219 09/08/2015    Recent Labs  Lab 09/08/15 1325  NA 135  K 4.6  CL 104  CO2 26  BUN 11  CREATININE 0.92  CALCIUM 8.8*  PROT 6.1*  BILITOT 1.3*  ALKPHOS 90  ALT 48  AST 61*  GLUCOSE 105*    Recent Labs  09/08/15 1923  TSH 1.638    EKG 09/09/2015: Normal sinus rhythm, occasional PACs.  Normal QT interval.  EKG: 12:15 PM SVT at  the rate of 150 bpm the at no evidence of ischemia. 1600 EKG atrial tachycardia atrial rate 150 bpm with variable ventricular response. Normal QT interval. No evidence of ischemia.  Stress EKG 06/07/11: Positive for ischemia at low workload, exercise duration 5 min 7.3 METs. Patient had significant 2.5-3 mm ST segment depression at 4 min. into exercise and persisted for greater than 4 min. into recovery.  Heart cath 06/14/11 : Mid LAD occluded collaterals from RCA, LM 20%, Circ 50%. RI normal. EF 60% H/O known occluded LAD by cardiac cath 2010.  Echo- 01/19/14: Left ventricle cavity is normal in size. Moderate concentric hypertrophy of the left ventricle. Normal global wall motion. Visual EF is 55-60%. Doppler evidence of grade I (impaired) diastolic dysfunction. Calculated EF 55%. Trace mitral regurgitation. Trace tricuspid regurgitation. No diagnostic change c.f. echo. of 06/05/2011  FOLLOW UP PLANS AND APPOINTMENTS    Medication List    STOP taking these medications        carvedilol 3.125 MG tablet  Commonly known as:  COREG      TAKE these medications        acetaminophen 325 MG tablet  Commonly known as:  TYLENOL  Take 650 mg by mouth every 6 (six) hours as needed for moderate pain.     aspirin EC 81 MG tablet  Take 81 mg by mouth daily.     Melatonin 10 MG Tabs  Take 10 mg by mouth daily.     PARoxetine 20 MG tablet  Commonly known as:  PAXIL  Take 20 mg by mouth every evening.     polycarbophil 625 MG tablet  Commonly known as:  FIBERCON  Take 625 mg by mouth daily.     rosuvastatin 5 MG tablet  Commonly known as:  CRESTOR  Take 5 mg by mouth every morning.     sotalol 120 MG tablet  Commonly known as:  BETAPACE  Take 1 tablet (120 mg total) by mouth 2 (two) times daily.     Vitamin B-12 500 MCG Subl  Place 1 tablet under the tongue 3 (three) times a week.     Vitamin D3 5000 units Tabs  Take 5,000 Units by mouth daily.           Follow-up Information     Follow up with Cristopher Peru, MD On 10/25/2015.   Specialty:  Cardiology   Why:  2:30PM   Contact information:   [address] [place] [address] [phone]       Call Adrian Prows, MD.   Specialty:  Cardiology   Why:  Call to be seen in 2 weeks   Contact information:   [place] [place] [address] [phone]      Adrian Prows, MD 09/09/2015, 12:53 PM  Pager: [phone] Office: [phone] If no answer: [phone]

## 2015-09-09 NOTE — Discharge Instructions (Signed)
Information on my medicine - XARELTO (Rivaroxaban)  This medication education was reviewed with me or my healthcare representative as part of my discharge preparation. Why was Xarelto prescribed for you? Xarelto was prescribed for you to reduce the risk of a blood clot forming that can cause a stroke if you have a medical condition called atrial fibrillation (a type of irregular heartbeat).  What do you need to know about xarelto ? Take your Xarelto ONCE DAILY at the same time every day with your evening meal. If you have difficulty swallowing the tablet whole, you may crush it and mix in applesauce just prior to taking your dose.  Take Xarelto exactly as prescribed by your doctor and DO NOT stop taking Xarelto without talking to the doctor who prescribed the medication.  Stopping without other stroke prevention medication to take the place of Xarelto may increase your risk of developing a clot that causes a stroke.  Refill your prescription before you run out.  After discharge, you should have regular check-up appointments with your healthcare provider that is prescribing your Xarelto.  In the future your dose may need to be changed if your kidney function or weight changes by a significant amount.  What do you do if you miss a dose? If you are taking Xarelto ONCE DAILY and you miss a dose, take it as soon as you remember on the same day then continue your regularly scheduled once daily regimen the next day. Do not take two doses of Xarelto at the same time or on the same day.   Important Safety Information A possible side effect of Xarelto is bleeding. You should call your healthcare provider right away if you experience any of the following: ? Bleeding from an injury or your nose that does not stop. ? Unusual colored urine (red or dark brown) or unusual colored stools (red or black). ? Unusual bruising for unknown reasons. ? A serious fall or if you hit your head (even if there  is no bleeding).  Some medicines may interact with Xarelto and might increase your risk of bleeding while on Xarelto. To help avoid this, consult your healthcare provider or pharmacist prior to using any new prescription or non-prescription medications, including herbals, vitamins, non-steroidal anti-inflammatory drugs (NSAIDs) and supplements.  This website has more information on Xarelto: [email or web address]  Electrical Cardioversion Electrical cardioversion is the delivery of a jolt of electricity to change the rhythm of the heart. Sticky patches or metal paddles are placed on the chest to deliver the electricity from a device. This is done to restore a normal rhythm. A rhythm that is too fast or not regular keeps the heart from pumping well. Electrical cardioversion is done in an emergency if:   There is low or no blood pressure as a result of the heart rhythm.   Normal rhythm must be restored as fast as possible to protect the brain and heart from further damage.   It may save a life. Cardioversion may be done for heart rhythms that are not immediately life threatening, such as atrial fibrillation or flutter, in which:   The heart is beating too fast or is not regular.   Medicine to change the rhythm has not worked.   It is safe to wait in order to allow time for preparation.  Symptoms of the abnormal rhythm are bothersome.  The risk of stroke and other serious problems can be reduced. LET [facility] CARE PROVIDER KNOW ABOUT:   Any allergies you  have.  All medicines you are taking, including vitamins, herbs, eye drops, creams, and over-the-counter medicines.  Previous problems you or members of your family have had with the use of anesthetics.   Any blood disorders you have.   Previous surgeries you have had.   Medical conditions you have. RISKS AND COMPLICATIONS  Generally, this is a safe procedure. However, problems can occur and include:   Breathing problems  related to the anesthetic used.  A blood clot that breaks free and travels to other parts of your body. This could cause a stroke or other problems. The risk of this is lowered by use of blood-thinning medicine (anticoagulant) prior to the procedure.  Cardiac arrest (rare). BEFORE THE PROCEDURE   You may have tests to detect blood clots in your heart and to evaluate heart function.  You may start taking anticoagulants so your blood does not clot as easily.   Medicines may be given to help stabilize your heart rate and rhythm. PROCEDURE  You will be given medicine through an IV tube to reduce discomfort and make you sleepy (sedative).   An electrical shock will be delivered. AFTER THE PROCEDURE Your heart rhythm will be watched to make sure it does not change.    This information is not intended to replace advice given to you by your health care provider. Make sure you discuss any questions you have with your health care provider.   Document Released: 02/16/2002 Document Revised: 03/19/2014 Document Reviewed: 09/10/2012 Elsevier Interactive Patient Education [employer].

## 2015-10-25 ENCOUNTER — Institutional Professional Consult (permissible substitution): Payer: Medicare Other | Admitting: Internal Medicine

## 2015-10-28 ENCOUNTER — Encounter (INDEPENDENT_AMBULATORY_CARE_PROVIDER_SITE_OTHER): Payer: Self-pay

## 2015-10-28 ENCOUNTER — Ambulatory Visit (INDEPENDENT_AMBULATORY_CARE_PROVIDER_SITE_OTHER): Payer: Medicare Other | Admitting: Internal Medicine

## 2015-10-28 VITALS — BP 138/86 | HR 76 | Ht 69.0 in | Wt 219.6 lb

## 2015-10-28 DIAGNOSIS — I471 Supraventricular tachycardia: Secondary | ICD-10-CM

## 2015-10-28 NOTE — Patient Instructions (Signed)
Medication Instructions:  Your physician recommends that you continue on your current medications as directed. Please refer to the Current Medication list given to you today.   Labwork: None ordered   Testing/Procedures: None ordered   Follow-Up: Your physician recommends that you schedule a follow-up appointment as needed with Dr Lovena Le  Reduce alcohol consumption   Any Other Special Instructions Will Be Listed Below (If Applicable).     If you need a refill on your cardiac medications before your next appointment, please call your pharmacy.

## 2015-10-31 NOTE — Progress Notes (Signed)
HPI [name] is referred for evaluation of SVT. He is a pleasant 75 yo man with a h/o CAD, HTN and prior stroke. He presented to the hospital just over 6 weeks ago with SVT after presenting for an EGD and found to be in SVT. He was given IV adenosine and metoprolol without relief. He was ultimately given sotalol.. He had variable levels of AV block confirming a diagnosis of atrial tachycardia. Review of his ECG's demonstrated probably a right atrial tachycardia. He has tolerated sotalol very nicely. He remains asymptomatic. No syncope. Allergies  Allergen Reactions  . Tetanus Toxoid Anaphylaxis and Hives    REACTION: hives/throat swells shut  . Fish Oil Other (See Comments)    Stomach cramps, can't eat   . Glucosamine Other (See Comments)    cramping  . Ibuprofen Nausea Only and Other (See Comments)    Stomach pains  . Naproxen Diarrhea  . Glucosamine-Chondroitin     Stomach cramps, can't eat     Current Outpatient Prescriptions  Medication Sig Dispense Refill  . acetaminophen (TYLENOL) 325 MG tablet Take 650 mg by mouth every 6 (six) hours as needed for moderate pain.    Marland Kitchen aspirin EC 81 MG tablet Take 81 mg by mouth daily.    . Cholecalciferol (VITAMIN D3) 5000 units TABS Take 5,000 Units by mouth daily.    . Cyanocobalamin (VITAMIN B-12) 500 MCG SUBL Place 1 tablet under the tongue 3 (three) times a week.    . Melatonin 10 MG TABS Take 10 mg by mouth at bedtime.     Marland Kitchen omeprazole (PRILOSEC) 40 MG capsule Take 1 capsule by mouth every morning.    Marland Kitchen PARoxetine (PAXIL) 20 MG tablet Take 20 mg by mouth every evening.      . polycarbophil (FIBERCON) 625 MG tablet Take 625 mg by mouth daily.      . rosuvastatin (CRESTOR) 5 MG tablet Take 5 mg by mouth every morning.     . sotalol (BETAPACE) 120 MG tablet Take 1 tablet (120 mg total) by mouth 2 (two) times daily. 60 tablet 1   No current facility-administered medications for this visit.      Past Medical History:  Diagnosis  Date  . Arthritis   . Chronic sinusitis   . Coronary artery disease   . Depression   . Difficult intubation    was told with shoulder done 2006-alittle narrow  . Fall against sharp object    stiches in head  . 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 Headache 01/12/2014  . Hypertension   . Jejunostomy tube fell out ([place])   . 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 Spastic colon     ROS:   All systems reviewed and negative except as noted in the HPI.   Past Surgical History:  Procedure Laterality Date  . CARDIAC CATHETERIZATION  10/2008  . CARDIAC CATHETERIZATION  4/13   20% LM, chronic occluded mid LAD, 50% ostial LCX, 20% mid RI, RCA with collaterals to mid LAD, mid 10% stenosis, EF 60% 06/14/11  . FOOT NEUROMA SURGERY  2002  . LEFT HEART CATHETERIZATION WITH CORONARY ANGIOGRAM Right 06/14/2011   Procedure: LEFT HEART CATHETERIZATION WITH CORONARY ANGIOGRAM;  Surgeon: Laverda Page, MD;  [facility] C[facility] LAB;  Service: Cardiovascular;  Laterality: Right;  . NASAL SINUS SURGERY    . SHOULDER ARTHROSCOPY  03/01/2011   Procedure: ARTHROSCOPY SHOULDER;  Surgeon: Ninetta Lights, MD;  Location: [place];  Service: Orthopedics;  Laterality: Right;  arthroscopy shoulder decompression subacromial partial acromioplasty with coracoacromial release, distal claviculectomy, debridement of labrium  . SHOULDER ARTHROSCOPY  03/01/2011   Procedure: ARTHROSCOPY SHOULDER;  Surgeon: Ninetta Lights, MD;  Location: [place];  Service: Orthopedics;  Laterality: Right;  arthroscopy shoulder decompression subacromial partial acromioplasty with coracoacromial release, distal claviculectomy, debridement of labrium  . TOTAL KNEE ARTHROPLASTY Right 09/23/2013   Procedure: TOTAL KNEE ARTHROPLASTY;  Surgeon: Ninetta Lights, MD;  Location: [place];  Service: Orthopedics;  Laterality: Right;  . TOTAL SHOULDER ARTHROPLASTY  06/28/2011   Procedure: TOTAL SHOULDER  ARTHROPLASTY;  Surgeon: Ninetta Lights, MD;  Location: [place];  Service: Orthopedics;  Laterality: Right;  . TOTAL SHOULDER REPLACEMENT  2007   left  . UVULOPALATOPHARYNGOPLASTY       Family History  Problem Relation Age of Onset  . Cancer Mother     colon  . Heart attack Father   . Migraines Brother      Social History   Social History  . Marital status: Married    Spouse name: N/A  . Number of children: 3  . Years of education: N/A   Occupational History  . retired    Social History Main Topics  . Smoking status: Former Smoker    Quit date: 02/27/1998  . Smokeless tobacco: Never Used  . Alcohol use 8.4 oz/week    14 Standard drinks or equivalent per week     Comment: daily  . Drug use: No  . Sexual activity: Not on file   Other Topics Concern  . Not on file   Social History Narrative   Patient is left handed.   Patient drinks one cup caffeine daily.     BP 138/86   Pulse 76   Ht '5\' 9"'$  (1.753 m)   Wt 219 lb 9.6 oz (99.6 kg)   BMI 32.43 kg/m   Physical Exam:  Well appearing NAD HEENT: Unremarkable Neck:  6 cm JVD, no thyromegally Lymphatics:  No adenopathy Back:  No CVA tenderness Lungs:  Clear with no wheezes HEART:  Regular rate rhythm, no murmurs, no rubs, no clicks Abd:  soft, positive bowel sounds, no organomegally, no rebound, no guarding Ext:  2 plus pulses, no edema, no cyanosis, no clubbing Skin:  No rashes no nodules Neuro:  CN II through XII intact, motor grossly intact  EKG - NSR   Assess/Plan: 1. Atrial tachycardia - he is maintaining NSR. I discussed the treatment options with the patient. As he is tolerating his sotalol well, and because he is asymptomatic when he goes into SVT, would recommend he continue watchful waiting. If sotalol become ineffective then would consider ablation. 2. CAD - he denies anginal symptoms. 3. Stroke - he is asymptomatic. No prior evaluation.

## 2015-11-01 DIAGNOSIS — I471 Supraventricular tachycardia: Secondary | ICD-10-CM | POA: Insufficient documentation

## 2015-11-08 ENCOUNTER — Institutional Professional Consult (permissible substitution): Payer: Medicare Other | Admitting: Internal Medicine

## 2015-11-09 ENCOUNTER — Encounter: Payer: Self-pay | Admitting: Internal Medicine

## 2015-11-17 ENCOUNTER — Encounter: Payer: Self-pay | Admitting: Internal Medicine

## 2015-11-17 ENCOUNTER — Ambulatory Visit (INDEPENDENT_AMBULATORY_CARE_PROVIDER_SITE_OTHER): Payer: Medicare Other | Admitting: Internal Medicine

## 2015-11-17 VITALS — BP 140/78 | HR 96 | Ht 69.0 in | Wt 211.3 lb

## 2015-11-17 DIAGNOSIS — I471 Supraventricular tachycardia: Secondary | ICD-10-CM

## 2015-11-17 MED ORDER — METOPROLOL TARTRATE 25 MG PO TABS: 25.0000 mg | ORAL_TABLET | Freq: Two times a day (BID) | ORAL | 3 refills | Status: DC

## 2015-11-17 NOTE — Progress Notes (Signed)
HPI Mr. Goines returns for ongoing evaluation of SVT and now PAF. He is a pleasant 75 yo man with a h/o CAD, HTN and prior cryptogenic stroke. He presented to the hospital just over 6 weeks ago with SVT after presenting for an EGD and found to be in SVT. He was given IV adenosine and metoprolol without relief. He was ultimately given sotalol.. He had variable levels of AV block confirming a diagnosis of atrial tachycardia. He was in Dr. Irven Shelling office a couple of days ago and was in atrial tachy and was minimally symptomatic (sob). He was given a heart monitor which demonstrated atrial tachy but also atrial fibrillation. His only symptoms are episodic sob. He monitors his HR and notes that it is variable and irregular. Pressures have been normal and elevated.  Allergies  Allergen Reactions  . Tetanus Toxoid Anaphylaxis and Hives    REACTION: hives/throat swells shut  . Fish Oil Other (See Comments)    Stomach cramps, can't eat   . Glucosamine Other (See Comments)    cramping  . Ibuprofen Nausea Only and Other (See Comments)    Stomach pains  . Naproxen Diarrhea  . Glucosamine-Chondroitin     Stomach cramps, can't eat     Current Outpatient Prescriptions  Medication Sig Dispense Refill  . acetaminophen (TYLENOL) 325 MG tablet Take 650 mg by mouth every 6 (six) hours as needed for moderate pain.    Marland Kitchen amLODipine (NORVASC) 5 MG tablet Take 1 tablet by mouth daily.    . Cholecalciferol (VITAMIN D3) 5000 units TABS Take 5,000 Units by mouth daily.    . Cyanocobalamin (VITAMIN B-12) 500 MCG SUBL Place 1 tablet under the tongue 3 (three) times a week.    . Melatonin 10 MG TABS Take 10 mg by mouth at bedtime.     Marland Kitchen omeprazole (PRILOSEC) 40 MG capsule Take 1 capsule by mouth every morning.    Marland Kitchen PARoxetine (PAXIL) 20 MG tablet Take 20 mg by mouth every evening.      . polycarbophil (FIBERCON) 625 MG tablet Take 625 mg by mouth daily.      . rosuvastatin (CRESTOR) 5 MG tablet Take 5 mg by  mouth every morning.     . sotalol (BETAPACE) 160 MG tablet Take 1 tablet by mouth 2 (two) times daily.    . valsartan (DIOVAN) 160 MG tablet Take 1 tablet by mouth daily.    Alveda Reasons 20 MG TABS tablet Take 1 tablet by mouth daily after supper.     No current facility-administered medications for this visit.      Past Medical History:  Diagnosis Date  . Arthritis   . Chronic sinusitis   . Coronary artery disease   . Depression   . Difficult intubation    was told with shoulder done 2006-alittle narrow  . Fall against sharp object    stiches in head  . 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 Headache 01/12/2014  . Hypertension   . Jejunostomy tube fell out ([place])   . 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 Spastic colon     ROS:   All systems reviewed and negative except as noted in the HPI.   Past Surgical History:  Procedure Laterality Date  . CARDIAC CATHETERIZATION  10/2008  . CARDIAC CATHETERIZATION  4/13   20% LM, chronic occluded mid LAD, 50% ostial LCX, 20% mid RI, RCA with collaterals to mid LAD, mid 10% stenosis,  EF 60% 06/14/11  . FOOT NEUROMA SURGERY  2002  . LEFT HEART CATHETERIZATION WITH CORONARY ANGIOGRAM Right 06/14/2011   Procedure: LEFT HEART CATHETERIZATION WITH CORONARY ANGIOGRAM;  Surgeon: Laverda Page, MD;  Location: [facility] CATH LAB;  Service: Cardiovascular;  Laterality: Right;  . NASAL SINUS SURGERY    . SHOULDER ARTHROSCOPY  03/01/2011   Procedure: ARTHROSCOPY SHOULDER;  Surgeon: Ninetta Lights, MD;  Location: [place];  Service: Orthopedics;  Laterality: Right;  arthroscopy shoulder decompression subacromial partial acromioplasty with coracoacromial release, distal claviculectomy, debridement of labrium  . SHOULDER ARTHROSCOPY  03/01/2011   Procedure: ARTHROSCOPY SHOULDER;  Surgeon: Ninetta Lights, MD;  Location: [place];  Service: Orthopedics;  Laterality: Right;  arthroscopy shoulder decompression  subacromial partial acromioplasty with coracoacromial release, distal claviculectomy, debridement of labrium  . TOTAL KNEE ARTHROPLASTY Right 09/23/2013   Procedure: TOTAL KNEE ARTHROPLASTY;  Surgeon: Ninetta Lights, MD;  Location: [place];  Service: Orthopedics;  Laterality: Right;  . TOTAL SHOULDER ARTHROPLASTY  06/28/2011   Procedure: TOTAL SHOULDER ARTHROPLASTY;  Surgeon: Ninetta Lights, MD;  Location: [place];  Service: Orthopedics;  Laterality: Right;  . TOTAL SHOULDER REPLACEMENT  2007   left  . UVULOPALATOPHARYNGOPLASTY       Family History  Problem Relation Age of Onset  . Cancer Mother     colon  . Heart attack Father   . Migraines Brother      Social History   Social History  . Marital status: Married    Spouse name: N/A  . Number of children: 3  . Years of education: N/A   Occupational History  . retired    Social History Main Topics  . Smoking status: Former Smoker    Quit date: 02/27/1998  . Smokeless tobacco: Never Used  . Alcohol use 8.4 oz/week    14 Standard drinks or equivalent per week     Comment: daily  . Drug use: No  . Sexual activity: Not on file   Other Topics Concern  . Not on file   Social History Narrative   Patient is left handed.   Patient drinks one cup caffeine daily.     BP 140/78   Pulse 96   Ht '5\' 9"'$  (1.753 m)   Wt 211 lb 4.8 oz (95.8 kg)   BMI 31.20 kg/m   Physical Exam:  Well appearing 75 yo man, NAD HEENT: Unremarkable Neck:  6 cm JVD, no thyromegally Lymphatics:  No adenopathy Back:  No CVA tenderness Lungs:  Clear with no wheezes HEART:  Regular rate rhythm, no murmurs, no rubs, no clicks Abd:  soft, positive bowel sounds, no organomegally, no rebound, no guarding Ext:  2 plus pulses, no edema, no cyanosis, no clubbing Skin:  No rashes no nodules Neuro:  CN II through XII intact, motor grossly intact  EKG - reviewed - atrial tachy  Assess/Plan: 1. Atrial tachycardia - he is no longer in  rhythm and also shows evidence of atrial fib. We discussed Tikosyn and amiodarone as well as rate control. After an exhaustive discussion, he would like to try rate control for now but will consider rhythm control if rate control does not work. 2. CAD - he denies anginal symptoms. 3. Stroke - he is asymptomatic. At this point it is highly likely that he is having atrial fib as the cause and he will be on lifelong anti-coagulation. 4. Atrial fib - as above. He is asymptomatic or  minimally symptomatic.   Mikle Bosworth.D.

## 2015-11-17 NOTE — Patient Instructions (Addendum)
Medication Instructions:  Your physician has recommended you make the following change in your medication:   1) STOP Sotalol 2) START Metoprolol 25 mg twice a day  Labwork: None Ordered   Testing/Procedures: None Ordered   Follow-Up: Your physician recommends that you schedule a follow-up appointment in: 3-4 weeks with Dr. Lovena Le.    Any Other Special Instructions Will Be Listed Below (If Applicable).     If you need a refill on your cardiac medications before your next appointment, please call your pharmacy.

## 2015-11-18 ENCOUNTER — Encounter (INDEPENDENT_AMBULATORY_CARE_PROVIDER_SITE_OTHER): Payer: Medicare Other | Admitting: Ophthalmology

## 2015-11-18 DIAGNOSIS — D3131 Benign neoplasm of right choroid: Secondary | ICD-10-CM

## 2015-11-18 DIAGNOSIS — H353132 Nonexudative age-related macular degeneration, bilateral, intermediate dry stage: Secondary | ICD-10-CM

## 2015-11-18 DIAGNOSIS — I10 Essential (primary) hypertension: Secondary | ICD-10-CM

## 2015-11-18 DIAGNOSIS — H43813 Vitreous degeneration, bilateral: Secondary | ICD-10-CM | POA: Diagnosis not present

## 2015-11-18 DIAGNOSIS — H35033 Hypertensive retinopathy, bilateral: Secondary | ICD-10-CM

## 2015-11-18 DIAGNOSIS — H34831 Tributary (branch) retinal vein occlusion, right eye, with macular edema: Secondary | ICD-10-CM | POA: Diagnosis not present

## 2015-11-18 DIAGNOSIS — H2513 Age-related nuclear cataract, bilateral: Secondary | ICD-10-CM

## 2015-11-30 ENCOUNTER — Encounter: Payer: Self-pay | Admitting: Internal Medicine

## 2015-12-01 ENCOUNTER — Other Ambulatory Visit (HOSPITAL_COMMUNITY): Payer: Self-pay | Admitting: Interventional Radiology

## 2015-12-01 DIAGNOSIS — I771 Stricture of artery: Secondary | ICD-10-CM

## 2015-12-12 ENCOUNTER — Ambulatory Visit (HOSPITAL_COMMUNITY)
Admission: RE | Admit: 2015-12-12 | Discharge: 2015-12-12 | Disposition: A | Discharge status: Home / Self Care | Payer: Medicare Other | Source: Ambulatory Visit | Attending: Interventional Radiology | Admitting: Interventional Radiology

## 2015-12-12 DIAGNOSIS — I6502 Occlusion and stenosis of left vertebral artery: Secondary | ICD-10-CM | POA: Diagnosis not present

## 2015-12-12 DIAGNOSIS — J328 Other chronic sinusitis: Secondary | ICD-10-CM | POA: Diagnosis not present

## 2015-12-12 DIAGNOSIS — I771 Stricture of artery: Secondary | ICD-10-CM

## 2015-12-12 DIAGNOSIS — G319 Degenerative disease of nervous system, unspecified: Secondary | ICD-10-CM | POA: Diagnosis not present

## 2015-12-12 DIAGNOSIS — I638 Other cerebral infarction: Secondary | ICD-10-CM | POA: Insufficient documentation

## 2015-12-12 DIAGNOSIS — I672 Cerebral atherosclerosis: Secondary | ICD-10-CM | POA: Insufficient documentation

## 2015-12-12 LAB — CREATININE, SERUM: Creatinine, Ser: 0.81 mg/dL (ref 0.61–1.24)

## 2015-12-12 MED ORDER — GADOBENATE DIMEGLUMINE 529 MG/ML IV SOLN
20.0000 mL | Freq: Once | INTRAVENOUS | Status: AC
  Administered 2015-12-12: 20 mL via INTRAVENOUS

## 2015-12-13 ENCOUNTER — Telehealth (HOSPITAL_COMMUNITY): Payer: Self-pay | Admitting: Radiology

## 2015-12-13 NOTE — Telephone Encounter (Signed)
Called pt, no answer and no VM JM

## 2015-12-14 ENCOUNTER — Encounter: Payer: Self-pay | Admitting: Internal Medicine

## 2015-12-14 ENCOUNTER — Ambulatory Visit (INDEPENDENT_AMBULATORY_CARE_PROVIDER_SITE_OTHER): Payer: Medicare Other | Admitting: Internal Medicine

## 2015-12-14 VITALS — BP 142/96 | HR 64 | Ht 70.0 in | Wt 212.0 lb

## 2015-12-14 DIAGNOSIS — R0602 Shortness of breath: Secondary | ICD-10-CM | POA: Diagnosis not present

## 2015-12-14 DIAGNOSIS — I471 Supraventricular tachycardia: Secondary | ICD-10-CM | POA: Diagnosis not present

## 2015-12-14 NOTE — Progress Notes (Signed)
HPI George Vega returns for ongoing evaluation of SVT and now PAF. He is a pleasant 75 yo man with a h/o CAD, HTN and prior cryptogenic stroke. He presented to the hospital just over 6 weeks ago with SVT after presenting for an EGD and found to be in SVT. He was given IV adenosine and metoprolol without relief. He was ultimately given sotalol.. He had variable levels of AV block confirming a diagnosis of atrial tachycardia. He was in Dr. Irven Shelling office a couple of days ago and was in atrial tachy and was minimally symptomatic (sob). He was given a heart monitor which demonstrated atrial tachy but also atrial fibrillation. His only symptoms are episodic sob. He monitors his HR and notes that it is variable and irregular. Pressures have been normal and elevated.  Allergies  Allergen Reactions  . Tetanus Toxoid Anaphylaxis and Hives    REACTION: hives/throat swells shut  . Fish Oil Other (See Comments)    Stomach cramps, can't eat   . Glucosamine Other (See Comments)    cramping  . Ibuprofen Nausea Only and Other (See Comments)    Stomach pains  . Naproxen Diarrhea  . Glucosamine-Chondroitin     Stomach cramps, can't eat     Current Outpatient Prescriptions  Medication Sig Dispense Refill  . acetaminophen (TYLENOL) 325 MG tablet Take 650 mg by mouth every 6 (six) hours as needed for moderate pain.    Marland Kitchen amLODipine (NORVASC) 5 MG tablet Take 1 tablet by mouth daily.    . Cholecalciferol (VITAMIN D3) 5000 units TABS Take 5,000 Units by mouth daily.    . Cyanocobalamin (VITAMIN B-12) 500 MCG SUBL Place 1 tablet under the tongue 3 (three) times a week.    . Melatonin 10 MG TABS Take 10 mg by mouth at bedtime.     . metoprolol tartrate (LOPRESSOR) 25 MG tablet Take 1 tablet (25 mg total) by mouth 2 (two) times daily. 180 tablet 3  . omeprazole (PRILOSEC) 40 MG capsule Take 1 capsule by mouth every morning.    Marland Kitchen PARoxetine (PAXIL) 20 MG tablet Take 20 mg by mouth every evening.      .  polycarbophil (FIBERCON) 625 MG tablet Take 625 mg by mouth daily.      . rosuvastatin (CRESTOR) 5 MG tablet Take 5 mg by mouth every morning.     . valsartan (DIOVAN) 160 MG tablet Take 1 tablet by mouth daily.    George Vega 20 MG TABS tablet Take 1 tablet by mouth daily after supper.     No current facility-administered medications for this visit.      Past Medical History:  Diagnosis Date  . Arthritis   . Chronic sinusitis   . Coronary artery disease   . Depression   . Difficult intubation    was told with shoulder done 2006-alittle narrow  . Fall against sharp object    stiches in head  . 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 Headache 01/12/2014  . Hypertension   . Jejunostomy tube fell out   . 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 Spastic colon     ROS:   All systems reviewed and negative except as noted in the HPI.   Past Surgical History:  Procedure Laterality Date  . CARDIAC CATHETERIZATION  10/2008  . CARDIAC CATHETERIZATION  4/13   20% LM, chronic occluded mid LAD, 50% ostial LCX, 20% mid RI, RCA with collaterals to mid  LAD, mid 10% stenosis, EF 60% 06/14/11  . FOOT NEUROMA SURGERY  2002  . LEFT HEART CATHETERIZATION WITH CORONARY ANGIOGRAM Right 06/14/2011   Procedure: LEFT HEART CATHETERIZATION WITH CORONARY ANGIOGRAM;  Surgeon: Laverda Page, MD;  Location: [facility] CATH LAB;  Service: Cardiovascular;  Laterality: Right;  . NASAL SINUS SURGERY    . SHOULDER ARTHROSCOPY  03/01/2011   Procedure: ARTHROSCOPY SHOULDER;  Surgeon: Ninetta Lights, MD;  Location: [place];  Service: Orthopedics;  Laterality: Right;  arthroscopy shoulder decompression subacromial partial acromioplasty with coracoacromial release, distal claviculectomy, debridement of labrium  . SHOULDER ARTHROSCOPY  03/01/2011   Procedure: ARTHROSCOPY SHOULDER;  Surgeon: Ninetta Lights, MD;  Location: [place];  Service: Orthopedics;  Laterality: Right;   arthroscopy shoulder decompression subacromial partial acromioplasty with coracoacromial release, distal claviculectomy, debridement of labrium  . TOTAL KNEE ARTHROPLASTY Right 09/23/2013   Procedure: TOTAL KNEE ARTHROPLASTY;  Surgeon: Ninetta Lights, MD;  Location: [place];  Service: Orthopedics;  Laterality: Right;  . TOTAL SHOULDER ARTHROPLASTY  06/28/2011   Procedure: TOTAL SHOULDER ARTHROPLASTY;  Surgeon: Ninetta Lights, MD;  Location: [place];  Service: Orthopedics;  Laterality: Right;  . TOTAL SHOULDER REPLACEMENT  2007   left  . UVULOPALATOPHARYNGOPLASTY       Family History  Problem Relation Age of Onset  . Cancer Mother     colon  . Heart attack Father   . Migraines Brother      Social History   Social History  . Marital status: Married    Spouse name: N/A  . Number of children: 3  . Years of education: N/A   Occupational History  . retired    Social History Main Topics  . Smoking status: Former Smoker    Quit date: 02/27/1998  . Smokeless tobacco: Never Used  . Alcohol use 8.4 oz/week    14 Standard drinks or equivalent per week     Comment: daily  . Drug use: No  . Sexual activity: Not on file   Other Topics Concern  . Not on file   Social History Narrative   Patient is left handed.   Patient drinks one cup caffeine daily.     BP (!) 142/96   Pulse 64   Ht '5\' 10"'$  (1.778 m)   Wt 212 lb (96.2 kg)   BMI 30.42 kg/m   Physical Exam:  Well appearing 75 yo man, NAD HEENT: Unremarkable Neck:  6 cm JVD, no thyromegally Lymphatics:  No adenopathy Back:  No CVA tenderness Lungs:  Clear with no wheezes HEART:  Regular rate rhythm, no murmurs, no rubs, no clicks Abd:  soft, positive bowel sounds, no organomegally, no rebound, no guarding Ext:  2 plus pulses, no edema, no cyanosis, no clubbing Skin:  No rashes no nodules Neuro:  CN II through XII intact, motor grossly intact   Assess/Plan: 1. Atrial tachycardia - he Has paroxysmal  atrial tachycardia as well as other atrial arrhythmias. He will continue a strategy of rate control for now, as his rhythm control options are limited, and he is not interested in considering catheter ablation. 2. CAD - he denies anginal symptoms. He has a known occluded LAD with collaterals. 3. Stroke - he is asymptomatic. At this point it is highly likely that he is having atrial fib as the cause and he will be on lifelong anti-coagulation. 4. Atrial fib - as above. He is asymptomatic or minimally symptomatic.   Carleene Overlie  Tobey Schmelzle,M.D.

## 2015-12-14 NOTE — Patient Instructions (Addendum)
Medication Instructions:  Your physician recommends that you continue on your current medications as directed. Please refer to the Current Medication list given to you today.   Labwork: None Ordered   Testing/Procedures: Pulmonary Function Test -- schedule in the next 2 weeks   Follow-Up: Follow-up to be determined after results of pulmonary function test   Any Other Special Instructions Will Be Listed Below (If Applicable).     If you need a refill on your cardiac medications before your next appointment, please call your pharmacy.

## 2015-12-15 ENCOUNTER — Telehealth (HOSPITAL_COMMUNITY): Payer: Self-pay

## 2015-12-15 ENCOUNTER — Encounter (INDEPENDENT_AMBULATORY_CARE_PROVIDER_SITE_OTHER): Payer: Medicare Other | Admitting: Ophthalmology

## 2015-12-15 DIAGNOSIS — H34831 Tributary (branch) retinal vein occlusion, right eye, with macular edema: Secondary | ICD-10-CM

## 2015-12-15 DIAGNOSIS — H35033 Hypertensive retinopathy, bilateral: Secondary | ICD-10-CM | POA: Diagnosis not present

## 2015-12-15 DIAGNOSIS — D3131 Benign neoplasm of right choroid: Secondary | ICD-10-CM

## 2015-12-15 DIAGNOSIS — H353121 Nonexudative age-related macular degeneration, left eye, early dry stage: Secondary | ICD-10-CM | POA: Diagnosis not present

## 2015-12-15 DIAGNOSIS — H43813 Vitreous degeneration, bilateral: Secondary | ICD-10-CM | POA: Diagnosis not present

## 2015-12-15 DIAGNOSIS — R0602 Shortness of breath: Secondary | ICD-10-CM

## 2015-12-15 DIAGNOSIS — I10 Essential (primary) hypertension: Secondary | ICD-10-CM

## 2015-12-15 NOTE — Telephone Encounter (Signed)
Pt's wife agreed to have pt f/u in 6 months with MRI. AW

## 2015-12-16 ENCOUNTER — Encounter (INDEPENDENT_AMBULATORY_CARE_PROVIDER_SITE_OTHER): Payer: Medicare Other | Admitting: Internal Medicine

## 2015-12-16 DIAGNOSIS — R0602 Shortness of breath: Secondary | ICD-10-CM | POA: Diagnosis not present

## 2015-12-16 LAB — PULMONARY FUNCTION TEST
DL/VA % PRED: 77 %
DL/VA: 3.5 ml/min/mmHg/L
DLCO COR % PRED: 58 %
DLCO cor: 18.26 ml/min/mmHg
DLCO unc % pred: 59 %
DLCO unc: 18.52 ml/min/mmHg
FEF 25-75 POST: 2.44 L/s
FEF 25-75 Pre: 2 L/sec
FEF2575-%Change-Post: 21 %
FEF2575-%PRED-POST: 114 %
FEF2575-%Pred-Pre: 94 %
FEV1-%CHANGE-POST: 5 %
FEV1-%Pred-Post: 91 %
FEV1-%Pred-Pre: 86 %
FEV1-Post: 2.7 L
FEV1-Pre: 2.54 L
FEV1FVC-%Change-Post: 1 %
FEV1FVC-%PRED-PRE: 104 %
FEV6-%Change-Post: 4 %
FEV6-%PRED-POST: 90 %
FEV6-%PRED-PRE: 87 %
FEV6-POST: 3.46 L
FEV6-Pre: 3.32 L
FEV6FVC-%Pred-Post: 106 %
FEV6FVC-%Pred-Pre: 106 %
FVC-%CHANGE-POST: 4 %
FVC-%PRED-POST: 85 %
FVC-%PRED-PRE: 81 %
FVC-POST: 3.46 L
FVC-PRE: 3.32 L
PRE FEV1/FVC RATIO: 77 %
Post FEV1/FVC ratio: 78 %
Post FEV6/FVC ratio: 100 %
Pre FEV6/FVC Ratio: 100 %
RV % pred: 93 %
RV: 2.35 L
TLC % pred: 82 %
TLC: 5.67 L

## 2015-12-20 ENCOUNTER — Other Ambulatory Visit: Payer: Self-pay | Admitting: Internal Medicine

## 2015-12-20 MED ORDER — XARELTO 20 MG PO TABS: 20.0000 mg | ORAL_TABLET | Freq: Every day | ORAL | 3 refills | Status: DC

## 2015-12-20 MED ORDER — XARELTO 20 MG PO TABS: 20.0000 mg | ORAL_TABLET | Freq: Every day | ORAL | 11 refills | Status: DC

## 2016-01-12 ENCOUNTER — Encounter (INDEPENDENT_AMBULATORY_CARE_PROVIDER_SITE_OTHER): Payer: Medicare Other | Admitting: Ophthalmology

## 2016-01-12 DIAGNOSIS — H2513 Age-related nuclear cataract, bilateral: Secondary | ICD-10-CM

## 2016-01-12 DIAGNOSIS — H353132 Nonexudative age-related macular degeneration, bilateral, intermediate dry stage: Secondary | ICD-10-CM | POA: Diagnosis not present

## 2016-01-12 DIAGNOSIS — H34831 Tributary (branch) retinal vein occlusion, right eye, with macular edema: Secondary | ICD-10-CM

## 2016-01-12 DIAGNOSIS — H35033 Hypertensive retinopathy, bilateral: Secondary | ICD-10-CM

## 2016-01-12 DIAGNOSIS — H43813 Vitreous degeneration, bilateral: Secondary | ICD-10-CM

## 2016-01-12 DIAGNOSIS — D3131 Benign neoplasm of right choroid: Secondary | ICD-10-CM | POA: Diagnosis not present

## 2016-01-12 DIAGNOSIS — I10 Essential (primary) hypertension: Secondary | ICD-10-CM

## 2016-01-20 ENCOUNTER — Encounter: Payer: Self-pay | Admitting: Internal Medicine

## 2016-01-20 ENCOUNTER — Ambulatory Visit (INDEPENDENT_AMBULATORY_CARE_PROVIDER_SITE_OTHER)
Admission: RE | Admit: 2016-01-20 | Discharge: 2016-01-20 | Disposition: A | Discharge status: Home / Self Care | Payer: Medicare Other | Source: Ambulatory Visit | Attending: Internal Medicine | Admitting: Internal Medicine

## 2016-01-20 ENCOUNTER — Other Ambulatory Visit (INDEPENDENT_AMBULATORY_CARE_PROVIDER_SITE_OTHER): Payer: Medicare Other

## 2016-01-20 ENCOUNTER — Ambulatory Visit (INDEPENDENT_AMBULATORY_CARE_PROVIDER_SITE_OTHER): Payer: Medicare Other | Admitting: Internal Medicine

## 2016-01-20 VITALS — BP 136/86 | HR 130 | Ht 70.0 in | Wt 218.6 lb

## 2016-01-20 DIAGNOSIS — R058 Other specified cough: Secondary | ICD-10-CM

## 2016-01-20 DIAGNOSIS — I471 Supraventricular tachycardia: Secondary | ICD-10-CM | POA: Diagnosis not present

## 2016-01-20 DIAGNOSIS — R05 Cough: Secondary | ICD-10-CM | POA: Diagnosis not present

## 2016-01-20 DIAGNOSIS — R0609 Other forms of dyspnea: Secondary | ICD-10-CM

## 2016-01-20 DIAGNOSIS — R0600 Dyspnea, unspecified: Secondary | ICD-10-CM | POA: Insufficient documentation

## 2016-01-20 LAB — CBC WITH DIFFERENTIAL/PLATELET
BASOS ABS: 0.1 10*3/uL (ref 0.0–0.1)
Basophils Relative: 0.8 % (ref 0.0–3.0)
EOS ABS: 0.6 10*3/uL (ref 0.0–0.7)
Eosinophils Relative: 6.6 % — ABNORMAL HIGH (ref 0.0–5.0)
HEMATOCRIT: 42 % (ref 39.0–52.0)
HEMOGLOBIN: 14 g/dL (ref 13.0–17.0)
LYMPHS PCT: 20.8 % (ref 12.0–46.0)
Lymphs Abs: 1.9 10*3/uL (ref 0.7–4.0)
MCHC: 33.3 g/dL (ref 30.0–36.0)
MCV: 94.6 fl (ref 78.0–100.0)
MONO ABS: 0.8 10*3/uL (ref 0.1–1.0)
Monocytes Relative: 8.9 % (ref 3.0–12.0)
Neutro Abs: 5.8 10*3/uL (ref 1.4–7.7)
Neutrophils Relative %: 62.9 % (ref 43.0–77.0)
Platelets: 219 10*3/uL (ref 150.0–400.0)
RBC: 4.44 Mil/uL (ref 4.22–5.81)
RDW: 14.9 % (ref 11.5–15.5)
WBC: 9.2 10*3/uL (ref 4.0–10.5)

## 2016-01-20 LAB — SEDIMENTATION RATE: SED RATE: 10 mm/h (ref 0–20)

## 2016-01-20 NOTE — Patient Instructions (Addendum)
Omeprazole 40   Take  30-60 min before first meal of the day and Pepcid (famotidine)  20 mg one @  bedtime until return to office - this is the best way to tell whether stomach acid is contributing to your problem.    GERD (REFLUX)  is an extremely common cause of respiratory symptoms just like yours , many times with no obvious heartburn at all.    It can be treated with medication, but also with lifestyle changes including elevation of the head of your bed (ideally with 6 inch  bed blocks),  Smoking cessation, avoidance of late meals, excessive alcohol, and avoid fatty foods, chocolate, peppermint, colas, red wine, and acidic juices such as orange juice.  NO MINT OR MENTHOL PRODUCTS SO NO COUGH DROPS   USE SUGARLESS CANDY INSTEAD (Jolley ranchers or Stover's or Life Savers) or even ice chips will also do - the key is to swallow to prevent all throat clearing. NO OIL BASED VITAMINS - use powdered substitutes.   Please remember to go to the lab and x-ray department downstairs for your tests - we will call you with the results when they are available.  Pulmonary follow up is as needed - keep up the walking as much as you can in the meantime

## 2016-01-20 NOTE — Progress Notes (Addendum)
Subjective:    Patient ID: George Vega, male    DOB: 06/20/1940,    MRN: [id number]  HPI  16 yowm quit smoking 1999 slowed down by arthritis but new waking from sleeping with sob x around 11/2015 referred to pulmonary clinic 01/20/2016 by Dr  Lovena Le with abn pfts 12/16/15    01/20/2016 1st [facility] office visit/ George Vega   Chief Complaint  Patient presents with  . Pulmonary Consult    Referred by Dr. Lovena Le. Pt c/o SOB off and on for the past 2 months. He states it mainly bothers him when he lies down. He occ gets SOB after he exerts himself.    wakes up sob x 2 months once a week x 20 sec, couple dep breaths and feels better.  Also wife hears noisy breathing at rest during same time period/ pt has sense of pnds and freq clearing his throat daytime only   He's more concerned with fatigue limiting activity tol than sob  Note has h/o acei cough remotely, resolved on arb  No obvious day to day or daytime variability or assoc excess/ purulent sputum or mucus plugs or hemoptysis or cp or chest tightness, subjective wheeze or other overt sinus or hb symptoms. No unusual exp hx or h/o childhood pna/ asthma or knowledge of premature birth.  Sleeping ok most nights flat without early am exacerbation  of respiratory  c/o's or need for noct saba. Also denies any obvious fluctuation of symptoms with weather or environmental changes or other aggravating or alleviating factors except as outlined above   Current Medications, Allergies, Complete Past Medical History, Past Surgical History, Family History, and Social History were reviewed in [employer] record.        Review of Systems  Constitutional: Negative for activity change, appetite change, chills, fever and unexpected weight change.  HENT: Positive for postnasal drip. Negative for congestion, dental problem, rhinorrhea, sneezing, sore throat, trouble swallowing and voice change.   Eyes: Negative for visual  disturbance.  Respiratory: Positive for shortness of breath. Negative for cough and choking.   Cardiovascular: Negative for chest pain and leg swelling.  Gastrointestinal: Negative for abdominal pain, nausea and vomiting.  Genitourinary: Negative for difficulty urinating.  Musculoskeletal: Positive for arthralgias.  Skin: Negative for rash.  Psychiatric/Behavioral: Negative for behavioral problems and confusion.       Objective:   Physical Exam  amb stoic wm nad    Wt Readings from Last 3 Encounters:  01/20/16 218 lb 9.6 oz (99.2 kg)  12/14/15 212 lb (96.2 kg)  11/17/15 211 lb 4.8 oz (95.8 kg)    Vital signs reviewed - - Note on arrival 02 sats  97% on RA - note pulse 130 irreg     HEENT: nl dentition, turbinates, and oropharynx. Nl external ear canals without cough reflex   NECK :  without JVD/Nodes/TM/ nl carotid upstrokes bilaterally   LUNGS: no acc muscle use,  Nl contour chest which is clear to A and P bilaterally without cough on insp or exp maneuvers   CV:  IRIR  no s3 or murmur or increase in P2, no edema   ABD:  soft and nontender with nl inspiratory excursion in the supine position. No bruits or organomegaly, bowel sounds nl  MS:  Nl gait/ ext warm without deformities, calf tenderness, cyanosis or clubbing No obvious joint restrictions   SKIN: warm and dry without lesions    NEURO:  alert, approp, nl sensorium with  no motor deficits       CXR PA and Lateral:   01/20/2016 :    I personally reviewed images and agree with radiology impression as follows:   The heart size and mediastinal contours are within normal limits. Both lungs are clear. The visualized skeletal structures are Unremarkable.   Labs ordered/ reviewed:      Chemistry      Component Value Date/Time   NA 135 09/08/2015 1325   K 4.6 09/08/2015 1325   CL 104 09/08/2015 1325   CO2 26 09/08/2015 1325   BUN 11 09/08/2015 1325   CREATININE 0.81 12/12/2015 0930      Component Value  Date/Time   CALCIUM 8.8 (L) 09/08/2015 1325   ALKPHOS 90 09/08/2015 1325   AST 61 (H) 09/08/2015 1325   ALT 48 09/08/2015 1325   BILITOT 1.3 (H) 09/08/2015 1325        Lab Results  Component Value Date   WBC 9.2 01/20/2016   HGB 14.0 01/20/2016   HCT 42.0 01/20/2016   MCV 94.6 01/20/2016   PLT 219.0 01/20/2016       EOS                       0.6                 01/20/2016      Lab Results  Component Value Date   TSH 1.638 09/08/2015        Lab Results  Component Value Date   ESRSEDRATE 10 01/20/2016   ESRSEDRATE 2 01/14/2014              Assessment & Plan:

## 2016-01-21 DIAGNOSIS — R058 Other specified cough: Secondary | ICD-10-CM | POA: Insufficient documentation

## 2016-01-21 DIAGNOSIS — R05 Cough: Secondary | ICD-10-CM | POA: Insufficient documentation

## 2016-01-21 NOTE — Assessment & Plan Note (Signed)
Does not appear to know when he's in or out of this rhythm and note rate did not change with ex test today or really appear to limit his ex tol > f/u with Dr Lovena Le planned

## 2016-01-21 NOTE — Assessment & Plan Note (Addendum)
PFTs  12/16/15  FVC  3.32 (81%) no obst and DLCO 59/58c and 77% with correction for alv vol - Spirometry 01/20/2016  FVC  3.12 (74%) and exp truncation  - 01/20/2016  Walked RA x 3 laps @ 185 ft each stopped due to  End of study, nl pace, mild sob with cough at end   The most impressive part of the hx and exam is not his doe but his upper airway cough (see separate a/p) and his resting heart rate of 130 which are obviously not related.  The issue per Dr Tanna Furry notes is the low dlco (which is completely non-specific and of no clinical significance and I suspect he needs a trial of amiodarone with ? Risk worsening his lung function.  No evidence of amiodarone toxicity at this point.  Patients typically have been on amiodarone for 6-12 months before this complication manifests.  Of note, serial clinical evaluation for symptoms such as cough dyspnea or fevers is  the preferred method of monitoring for pulmonary toxicity because a decrease in DLCO or lung volumes is a nonspecific for toxicity. Pathologically amiodarone pulmonary toxicity may appear as interstitial pneumonitis, eosinophilic pneumonia, organizing pneumonia, pulmonary fibrosis or less commonly as diffuse alveolar hemorrhage, pulmonary nodules or pleural effusions.  Risk factors for pulmonary toxicity include age greater than 69, daily dose greater than equal to 400 mg, a high cumulative dose, or pre-existing lung disease  - so he only has one risk factor at this point with poorly controlled afib and I would proceed with trial per Dr Lovena Le if indicated here.

## 2016-01-21 NOTE — Assessment & Plan Note (Addendum)
Allergy profile 01/20/2016 >  Eos 0.6 /  IgE     The throat clearing, cough with ex and "noisy breathing" per wife along with the noct symptoms are all typical of Upper airway cough syndrome (previously labeled PNDS) , is  so named because it's frequently impossible to sort out how much is  CR/sinusitis with freq throat clearing (which can be related to primary GERD)   vs  causing  secondary (" extra esophageal")  GERD from wide swings in gastric pressure that occur with throat clearing, often  promoting self use of mint and menthol lozenges that reduce the lower esophageal sphincter tone and exacerbate the problem further in a cyclical fashion.   These are the same pts (now being labeled as having "irritable larynx syndrome" by some cough centers) who not infrequently have a history of having failed to tolerate ace inhibitors(as is the case here) ,  dry powder inhalers or biphosphonates or report having atypical/extraesophageal reflux symptoms that don't respond to standard doses of PPI  and are easily confused as having aecopd or asthma flares by even experienced allergists/ pulmonologists (myself included).   rec max rx for gerd then return here if not improved  Total time devoted to counseling  = 35/37mreview case with pt/wife  discussion of options/alternatives/ personally creating written instructions  in presence of pt  then going over those specific  Instructions directly with the pt including how to use all of the meds but in particular covering each new medication in detail and the difference between the maintenance/automatic meds and the prns using an action plan format for the latter.

## 2016-01-23 LAB — RESPIRATORY ALLERGY PROFILE REGION II ~~LOC~~
Allergen, A. alternata, m6: <0.1 kU/L
Allergen, C. Herbarum, M2: <0.1 kU/L
Allergen, D pternoyssinus,d7: <0.1 kU/L
Allergen, Mulberry, t76: <0.1 kU/L
Allergen, P. notatum, m1: 0.47 kU/L — ABNORMAL HIGH
Aspergillus fumigatus, m3: 8.18 kU/L — ABNORMAL HIGH
Cat Dander: <0.1 kU/L
Cockroach: <0.1 kU/L
Common Ragweed: <0.1 kU/L
D. farinae: <0.1 kU/L
DOG DANDER: 0.23 kU/L — AB
IGE (IMMUNOGLOBULIN E), SERUM: 319 kU/L — AB (ref <115)
Johnson Grass: <0.1 kU/L
Pecan/Hickory Tree IgE: <0.1 kU/L
Rough Pigweed  IgE: <0.1 kU/L
Timothy Grass: <0.1 kU/L

## 2016-01-23 NOTE — Progress Notes (Signed)
Spoke with pt and notified of results per Dr. Wert. Pt verbalized understanding and denied any questions. 

## 2016-01-23 NOTE — Progress Notes (Signed)
Spoke with the pt's spouse and notified of results

## 2016-02-08 ENCOUNTER — Encounter (INDEPENDENT_AMBULATORY_CARE_PROVIDER_SITE_OTHER): Payer: Medicare Other | Admitting: Ophthalmology

## 2016-02-08 DIAGNOSIS — H34831 Tributary (branch) retinal vein occlusion, right eye, with macular edema: Secondary | ICD-10-CM | POA: Diagnosis not present

## 2016-02-08 DIAGNOSIS — H353132 Nonexudative age-related macular degeneration, bilateral, intermediate dry stage: Secondary | ICD-10-CM | POA: Diagnosis not present

## 2016-02-08 DIAGNOSIS — H43813 Vitreous degeneration, bilateral: Secondary | ICD-10-CM | POA: Diagnosis not present

## 2016-02-08 DIAGNOSIS — D3131 Benign neoplasm of right choroid: Secondary | ICD-10-CM | POA: Diagnosis not present

## 2016-02-08 DIAGNOSIS — H35033 Hypertensive retinopathy, bilateral: Secondary | ICD-10-CM

## 2016-02-08 DIAGNOSIS — I10 Essential (primary) hypertension: Secondary | ICD-10-CM

## 2016-02-29 ENCOUNTER — Encounter (INDEPENDENT_AMBULATORY_CARE_PROVIDER_SITE_OTHER): Payer: Medicare Other | Admitting: Ophthalmology

## 2016-02-29 DIAGNOSIS — H35033 Hypertensive retinopathy, bilateral: Secondary | ICD-10-CM

## 2016-02-29 DIAGNOSIS — D3131 Benign neoplasm of right choroid: Secondary | ICD-10-CM | POA: Diagnosis not present

## 2016-02-29 DIAGNOSIS — H34831 Tributary (branch) retinal vein occlusion, right eye, with macular edema: Secondary | ICD-10-CM | POA: Diagnosis not present

## 2016-02-29 DIAGNOSIS — H43813 Vitreous degeneration, bilateral: Secondary | ICD-10-CM

## 2016-02-29 DIAGNOSIS — H353132 Nonexudative age-related macular degeneration, bilateral, intermediate dry stage: Secondary | ICD-10-CM

## 2016-02-29 DIAGNOSIS — I10 Essential (primary) hypertension: Secondary | ICD-10-CM

## 2016-03-14 NOTE — Progress Notes (Signed)
Electrophysiology Office Note Date: 03/16/2016  ID:  George Vega, DOB 1940-04-06, MRN [id number]  PCP: [name], MD Primary Cardiologist: Einar Gip Electrophysiologist: Lovena Le  CC: SVT follow up  George Vega is a 76 y.o. male seen today for Dr Lovena Le.  He presents today for routine electrophysiology followup.  Since last being seen in our clinic, the patient reports increased shortness of breath and exercise intolerance. He is largely unaware of palpitations.  He denies chest pain, palpitations, PND, orthopnea, nausea, vomiting, dizziness, syncope, edema, weight gain, or early satiety.  Past Medical History:  Diagnosis Date  . Arthritis   . Chronic sinusitis   . Coronary artery disease   . Depression   . Difficult intubation    was told with shoulder done 2006-alittle narrow  . 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 Hypertension   . Jejunostomy tube fell out   . 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 Spastic colon   . Stroke (cerebrum) ([place])   . SVT (supraventricular tachycardia) ([facility])    Past Surgical History:  Procedure Laterality Date  . FOOT NEUROMA SURGERY  2002  . LEFT HEART CATHETERIZATION WITH CORONARY ANGIOGRAM Right 06/14/2011   20% LM, chronic occluded mid LAD, 50% ostial LCX, 20% mid RI, RCA with collaterals to mid LAD, mid 10% stenosis, EF 60% 06/14/11  . NASAL SINUS SURGERY    . SHOULDER ARTHROSCOPY  03/01/2011   Procedure: ARTHROSCOPY SHOULDER;  Surgeon: Ninetta Lights, MD;  Location: [place];  Service: Orthopedics;  Laterality: Right;  arthroscopy shoulder decompression subacromial partial acromioplasty with coracoacromial release, distal claviculectomy, debridement of labrium  . SHOULDER ARTHROSCOPY  03/01/2011   Procedure: ARTHROSCOPY SHOULDER;  Surgeon: Ninetta Lights, MD;  Location: [place];  Service: Orthopedics;  Laterality: Right;  arthroscopy shoulder decompression subacromial partial  acromioplasty with coracoacromial release, distal claviculectomy, debridement of labrium  . TOTAL KNEE ARTHROPLASTY Right 09/23/2013   Procedure: TOTAL KNEE ARTHROPLASTY;  Surgeon: Ninetta Lights, MD;  Location: [facility];  Service: Orthopedics;  Laterality: Right;  . TOTAL SHOULDER ARTHROPLASTY  06/28/2011   Procedure: TOTAL SHOULDER ARTHROPLASTY;  Surgeon: Ninetta Lights, MD;  Location: [place] [facility];  Service: Orthopedics;  Laterality: Right;  . UVULOPALATOPHARYNGOPLASTY      Current Outpatient Prescriptions  Medication Sig Dispense Refill  . acetaminophen (TYLENOL) 325 MG tablet Take 650 mg by mouth every 6 (six) hours as needed for moderate pain.    Marland Kitchen amLODipine (NORVASC) 5 MG tablet Take 1 tablet by mouth daily.    . Cholecalciferol (VITAMIN D3) 5000 units TABS Take 5,000 Units by mouth daily.    . Cyanocobalamin (VITAMIN B-12) 500 MCG SUBL Place 1 tablet under the tongue 3 (three) times a week.    . Melatonin 10 MG TABS Take 10 mg by mouth at bedtime.     . metoprolol tartrate (LOPRESSOR) 25 MG tablet Take 25 mg by mouth 2 (two) times daily.    Marland Kitchen omeprazole (PRILOSEC) 40 MG capsule Take 1 capsule by mouth every morning.    Marland Kitchen PARoxetine (PAXIL) 20 MG tablet Take 20 mg by mouth every evening.      . polycarbophil (FIBERCON) 625 MG tablet Take 625 mg by mouth daily.      . rosuvastatin (CRESTOR) 5 MG tablet Take 5 mg by mouth every morning.     . valsartan (DIOVAN) 160 MG tablet Take 1 tablet by mouth daily.    Marland Kitchen  XARELTO 20 MG TABS tablet Take 1 tablet (20 mg total) by mouth daily after supper. 90 tablet 3   No current facility-administered medications for this visit.     Allergies:   Tetanus toxoid; Fish oil; Ibuprofen; Naproxen; and Glucosamine-chondroitin   Social History: Social History   Social History  . Marital status: Married    Spouse name: N/A  . Number of children: 3  . Years of education: N/A   Occupational History  . retired    Social History Main  Topics  . Smoking status: Former Smoker    Packs/day: 2.00    Years: 30.00    Types: Cigarettes    Quit date: 02/27/1998  . Smokeless tobacco: Never Used  . Alcohol use 8.4 oz/week    14 Standard drinks or equivalent per week     Comment: daily  . Drug use: No  . Sexual activity: Not on file   Other Topics Concern  . Not on file   Social History Narrative   Patient is left handed.   Patient drinks one cup caffeine daily.    Family History: Family History  Problem Relation Age of Onset  . Cancer Mother     colon  . Heart attack Father   . Migraines Brother     Review of Systems: All other systems reviewed and are otherwise negative except as noted above.   Physical Exam: VS:  BP 118/86   Pulse (!) 127   Ht '5\' 10"'$  (1.778 m)   Wt 214 lb 12.8 oz (97.4 kg)   BMI 30.82 kg/m  , BMI Body mass index is 30.82 kg/m. Wt Readings from Last 3 Encounters:  03/15/16 214 lb 12.8 oz (97.4 kg)  01/20/16 218 lb 9.6 oz (99.2 kg)  12/14/15 212 lb (96.2 kg)    GEN- The patient is well appearing, alert and oriented x 3 today.   HEENT: normocephalic, atraumatic; sclera clear, conjunctiva pink; hearing intact; oropharynx clear; neck supple Lungs- Clear to ausculation bilaterally, normal work of breathing.  No wheezes, rales, rhonchi Heart- Tachycardic regular rate and rhythm GI- soft, non-tender, non-distended, bowel sounds present Extremities- no clubbing, cyanosis, or edema; DP/PT/radial pulses 2+ bilaterally MS- no significant deformity or atrophy Skin- warm and dry, no rash or lesion  Psych- euthymic mood, full affect Neuro- strength and sensation are intact   EKG:  EKG is ordered today. EKG today shows 2:1 atrial flutter, QTc 475mec (440-4637mc in SR)  Recent Labs: 09/08/2015: ALT 48; TSH 1.638 01/20/2016: Hemoglobin 14.0 03/15/2016: BUN 10; Creatinine, Ser 0.90; Magnesium 2.0; NT-Pro BNP 195; Platelets 215; Potassium 4.2; Sodium 138    Other studies  Reviewed: Additional studies/ records that were reviewed today include: Dr TaTanna Furryffice notes  Assessment and Plan: 1.  Persistent atrial fibrillation/atrial tachycardia/atrial flutter He has developed shortness of breath and exercise intolerance with persistent atrial arrhythmias. He has previously failed Sotalol and there are some comments in notes about concerns for bradycardia. I think he would best be served with a rhythm control strategy at this point. We have discussed AAD options. I think that Tikosyn is reasonable at this point. Unfortunately, he has missed some doses of Xarelto in the last 3 weeks and will require TEE prior to attempts at restoring SR. Will plan TEE on Monday followed by admission for Tikosyn.  Will obtain labs today and ask pharmacy to review medication list/pharmacy coverage. Risks, benefits to TEE and Tikosyn reviewed with patient and wife today.  With several different atrial  arrhythmias, I think success rates with ablation are reduced.  Continue Xarelto for CHADS2VASC of 5  2.  CAD No recent ischemic symptoms Continue medical therapy  3.  HTN Stable No change required today    Current medicines are reviewed at length with the patient today.   The patient does not have concerns regarding his medicines.  The following changes were made today:  none  Labs/ tests ordered today include: none Orders Placed This Encounter  Procedures  . Magnesium  . Basic metabolic panel  . CBC w/Diff  . Pro b natriuretic peptide  . EKG 12-Lead     Disposition:   Follow up with EP following Tikosyn admission    Signed, Chanetta Marshall, NP 03/16/2016 6:55 AM   [place] [address] [phone] (office) [phone] (fax)

## 2016-03-15 ENCOUNTER — Encounter: Payer: Self-pay | Admitting: Nurse Practitioner

## 2016-03-15 ENCOUNTER — Ambulatory Visit (INDEPENDENT_AMBULATORY_CARE_PROVIDER_SITE_OTHER): Payer: Medicare Other | Admitting: Nurse Practitioner

## 2016-03-15 VITALS — BP 118/86 | HR 127 | Ht 70.0 in | Wt 214.8 lb

## 2016-03-15 DIAGNOSIS — I481 Persistent atrial fibrillation: Secondary | ICD-10-CM

## 2016-03-15 DIAGNOSIS — I10 Essential (primary) hypertension: Secondary | ICD-10-CM

## 2016-03-15 DIAGNOSIS — I483 Typical atrial flutter: Secondary | ICD-10-CM

## 2016-03-15 DIAGNOSIS — I4819 Other persistent atrial fibrillation: Secondary | ICD-10-CM

## 2016-03-15 NOTE — Patient Instructions (Addendum)
Medication Instructions:   Your physician recommends that you continue on your current medications as directed. Please refer to the Current Medication list given to you today.   Labwork: Your physician recommends that you return for lab work today: Mag/BNP/CBC/BMP   Testing/Procedures: Your physician has requested that you have a TEE. During a TEE, sound waves are used to create images of your heart. It provides your doctor with information about the size and shape of your heart and how well your heart's chambers and valves are working. In this test, a transducer is attached to the end of a flexible tube that's guided down your throat and into your esophagus (the tube leading from you mouth to your stomach) to get a more detailed image of your heart. You are not awake for the procedure. Please see the instruction sheet given to you today. For further information please visit [email or web address]  Please arrive at [facility] at 7:00 am Do not eat or drink after midnight the night prior to procedure Okay to take your medications with a small sip of water   Admit after TEE for 3 day Tikosyn Load     Follow-Up: Your physician recommends that you schedule a follow-up appointment will be scheduled at discharge   Any Other Special Instructions Will Be Listed Below (If Applicable).     If you need a refill on your cardiac medications before your next appointment, please call your pharmacy.

## 2016-03-16 LAB — CBC WITH DIFFERENTIAL/PLATELET
BASOS: 1 %
Basophils Absolute: 0.1 10*3/uL (ref 0.0–0.2)
EOS (ABSOLUTE): 0.8 10*3/uL — ABNORMAL HIGH (ref 0.0–0.4)
EOS: 8 %
HEMATOCRIT: 41.3 % (ref 37.5–51.0)
HEMOGLOBIN: 14.1 g/dL (ref 13.0–17.7)
IMMATURE GRANULOCYTES: 0 %
Immature Grans (Abs): 0 10*3/uL (ref 0.0–0.1)
Lymphocytes Absolute: 1.7 10*3/uL (ref 0.7–3.1)
Lymphs: 17 %
MCH: 31.8 pg (ref 26.6–33.0)
MCHC: 34.1 g/dL (ref 31.5–35.7)
MCV: 93 fL (ref 79–97)
MONOCYTES: 13 %
MONOS ABS: 1.3 10*3/uL — AB (ref 0.1–0.9)
NEUTROS PCT: 61 %
Neutrophils Absolute: 6 10*3/uL (ref 1.4–7.0)
Platelets: 215 10*3/uL (ref 150–379)
RBC: 4.43 x10E6/uL (ref 4.14–5.80)
RDW: 13.7 % (ref 12.3–15.4)
WBC: 9.9 10*3/uL (ref 3.4–10.8)

## 2016-03-16 LAB — BASIC METABOLIC PANEL
BUN / CREAT RATIO: 11 (ref 10–24)
BUN: 10 mg/dL (ref 8–27)
CO2: 23 mmol/L (ref 18–29)
Calcium: 9.2 mg/dL (ref 8.6–10.2)
Chloride: 97 mmol/L (ref 96–106)
Creatinine, Ser: 0.9 mg/dL (ref 0.76–1.27)
GFR calc Af Amer: 96 mL/min/{1.73_m2} (ref >59)
GFR, EST NON AFRICAN AMERICAN: 83 mL/min/{1.73_m2} (ref >59)
Glucose: 107 mg/dL — ABNORMAL HIGH (ref 65–99)
Potassium: 4.2 mmol/L (ref 3.5–5.2)
SODIUM: 138 mmol/L (ref 134–144)

## 2016-03-16 LAB — PRO B NATRIURETIC PEPTIDE: NT-Pro BNP: 195 pg/mL (ref 0–486)

## 2016-03-16 LAB — MAGNESIUM: Magnesium: 2 mg/dL (ref 1.6–2.3)

## 2016-03-17 ENCOUNTER — Other Ambulatory Visit: Payer: Self-pay | Admitting: Nurse Practitioner

## 2016-03-19 ENCOUNTER — Other Ambulatory Visit: Payer: Self-pay | Admitting: Nurse Practitioner

## 2016-03-19 ENCOUNTER — Inpatient Hospital Stay (HOSPITAL_COMMUNITY): Payer: Medicare Other

## 2016-03-19 ENCOUNTER — Inpatient Hospital Stay (HOSPITAL_COMMUNITY)
Admission: RE | Admit: 2016-03-19 | Discharge: 2016-03-22 | DRG: 310 | Disposition: A | Discharge status: Home / Self Care | Payer: Medicare Other | Source: Ambulatory Visit | Attending: Internal Medicine | Admitting: Internal Medicine

## 2016-03-19 ENCOUNTER — Encounter (HOSPITAL_COMMUNITY)
Admission: RE | Disposition: A | Discharge status: Home / Self Care | Payer: Self-pay | Source: Ambulatory Visit | Attending: Internal Medicine

## 2016-03-19 ENCOUNTER — Encounter (HOSPITAL_COMMUNITY): Payer: Self-pay | Admitting: *Deleted

## 2016-03-19 DIAGNOSIS — Z96651 Presence of right artificial knee joint: Secondary | ICD-10-CM | POA: Diagnosis present

## 2016-03-19 DIAGNOSIS — I340 Nonrheumatic mitral (valve) insufficiency: Secondary | ICD-10-CM | POA: Diagnosis not present

## 2016-03-19 DIAGNOSIS — Z87891 Personal history of nicotine dependence: Secondary | ICD-10-CM

## 2016-03-19 DIAGNOSIS — I481 Persistent atrial fibrillation: Principal | ICD-10-CM | POA: Diagnosis present

## 2016-03-19 DIAGNOSIS — I081 Rheumatic disorders of both mitral and tricuspid valves: Secondary | ICD-10-CM | POA: Diagnosis present

## 2016-03-19 DIAGNOSIS — Z79899 Other long term (current) drug therapy: Secondary | ICD-10-CM

## 2016-03-19 DIAGNOSIS — Z888 Allergy status to other drugs, medicaments and biological substances status: Secondary | ICD-10-CM

## 2016-03-19 DIAGNOSIS — I10 Essential (primary) hypertension: Secondary | ICD-10-CM | POA: Diagnosis present

## 2016-03-19 DIAGNOSIS — I2510 Atherosclerotic heart disease of native coronary artery without angina pectoris: Secondary | ICD-10-CM | POA: Diagnosis present

## 2016-03-19 DIAGNOSIS — Z886 Allergy status to analgesic agent status: Secondary | ICD-10-CM | POA: Diagnosis not present

## 2016-03-19 DIAGNOSIS — I4891 Unspecified atrial fibrillation: Secondary | ICD-10-CM

## 2016-03-19 DIAGNOSIS — Z8673 Personal history of transient ischemic attack (TIA), and cerebral infarction without residual deficits: Secondary | ICD-10-CM | POA: Diagnosis not present

## 2016-03-19 DIAGNOSIS — F329 Major depressive disorder, single episode, unspecified: Secondary | ICD-10-CM | POA: Diagnosis present

## 2016-03-19 DIAGNOSIS — J329 Chronic sinusitis, unspecified: Secondary | ICD-10-CM | POA: Diagnosis present

## 2016-03-19 DIAGNOSIS — Z96611 Presence of right artificial shoulder joint: Secondary | ICD-10-CM | POA: Diagnosis present

## 2016-03-19 DIAGNOSIS — Z7901 Long term (current) use of anticoagulants: Secondary | ICD-10-CM | POA: Diagnosis not present

## 2016-03-19 DIAGNOSIS — I4819 Other persistent atrial fibrillation: Secondary | ICD-10-CM | POA: Diagnosis present

## 2016-03-19 DIAGNOSIS — Z8249 Family history of ischemic heart disease and other diseases of the circulatory system: Secondary | ICD-10-CM

## 2016-03-19 HISTORY — DX: Other persistent atrial fibrillation: I48.19

## 2016-03-19 HISTORY — PX: TEE WITHOUT CARDIOVERSION: SHX5443

## 2016-03-19 LAB — BASIC METABOLIC PANEL
ANION GAP: 7 (ref 5–15)
Anion gap: 8 (ref 5–15)
Anion gap: 8 (ref 5–15)
Anion gap: 6 (ref 5–15)
BUN: 10 mg/dL (ref 6–20)
BUN: 10 mg/dL (ref 6–20)
BUN: 12 mg/dL (ref 6–20)
BUN: 13 mg/dL (ref 6–20)
CHLORIDE: 106 mmol/L (ref 101–111)
CHLORIDE: 105 mmol/L (ref 101–111)
CO2: 27 mmol/L (ref 22–32)
CO2: 24 mmol/L (ref 22–32)
CO2: 25 mmol/L (ref 22–32)
CO2: 28 mmol/L (ref 22–32)
CREATININE: 0.76 mg/dL (ref 0.61–1.24)
Calcium: 9.1 mg/dL (ref 8.9–10.3)
Calcium: 9 mg/dL (ref 8.9–10.3)
Calcium: 8.8 mg/dL — ABNORMAL LOW (ref 8.9–10.3)
Calcium: 9.1 mg/dL (ref 8.9–10.3)
Chloride: 106 mmol/L (ref 101–111)
Chloride: 107 mmol/L (ref 101–111)
Creatinine, Ser: 0.78 mg/dL (ref 0.61–1.24)
Creatinine, Ser: 0.77 mg/dL (ref 0.61–1.24)
Creatinine, Ser: 0.87 mg/dL (ref 0.61–1.24)
GFR calc Af Amer: >60 mL/min (ref >60)
GFR calc Af Amer: >60 mL/min (ref >60)
GFR calc non Af Amer: >60 mL/min (ref >60)
GFR calc non Af Amer: >60 mL/min (ref >60)
GFR calc non Af Amer: >60 mL/min (ref >60)
GFR calc non Af Amer: >60 mL/min (ref >60)
Glucose, Bld: 116 mg/dL — ABNORMAL HIGH (ref 65–99)
Glucose, Bld: 178 mg/dL — ABNORMAL HIGH (ref 65–99)
Glucose, Bld: 135 mg/dL — ABNORMAL HIGH (ref 65–99)
Glucose, Bld: 114 mg/dL — ABNORMAL HIGH (ref 65–99)
POTASSIUM: 4.3 mmol/L (ref 3.5–5.1)
Potassium: 3.9 mmol/L (ref 3.5–5.1)
Potassium: 3.7 mmol/L (ref 3.5–5.1)
Potassium: 3.8 mmol/L (ref 3.5–5.1)
SODIUM: 138 mmol/L (ref 135–145)
SODIUM: 139 mmol/L (ref 135–145)
SODIUM: 139 mmol/L (ref 135–145)
Sodium: 141 mmol/L (ref 135–145)

## 2016-03-19 LAB — MAGNESIUM: Magnesium: 1.8 mg/dL (ref 1.7–2.4)

## 2016-03-19 SURGERY — ECHOCARDIOGRAM, TRANSESOPHAGEAL
Anesthesia: Moderate Sedation

## 2016-03-19 MED ORDER — FENTANYL CITRATE (PF) 100 MCG/2ML IJ SOLN
INTRAMUSCULAR | Status: AC
  Filled 2016-03-19: qty 2

## 2016-03-19 MED ORDER — AMLODIPINE BESYLATE 5 MG PO TABS
5.0000 mg | ORAL_TABLET | Freq: Every day | ORAL | Status: DC
  Administered 2016-03-20 – 2016-03-22 (×3): 5 mg via ORAL
  Filled 2016-03-19 (×3): qty 1

## 2016-03-19 MED ORDER — SODIUM CHLORIDE 0.9 % IV SOLN
INTRAVENOUS | Status: DC
  Administered 2016-03-19: 08:00:00 via INTRAVENOUS

## 2016-03-19 MED ORDER — DOFETILIDE 500 MCG PO CAPS
500.0000 ug | ORAL_CAPSULE | Freq: Two times a day (BID) | ORAL | Status: DC
  Administered 2016-03-19: 500 ug via ORAL
  Filled 2016-03-19 (×2): qty 1

## 2016-03-19 MED ORDER — FENTANYL CITRATE (PF) 100 MCG/2ML IJ SOLN
INTRAMUSCULAR | Status: DC | PRN
  Administered 2016-03-19 (×2): 25 ug via INTRAVENOUS

## 2016-03-19 MED ORDER — SODIUM CHLORIDE 0.9 % IV SOLN: 250.0000 mL | INTRAVENOUS | Status: DC | PRN

## 2016-03-19 MED ORDER — PAROXETINE HCL 20 MG PO TABS
20.0000 mg | ORAL_TABLET | Freq: Every evening | ORAL | Status: DC
  Administered 2016-03-19 – 2016-03-21 (×3): 20 mg via ORAL
  Filled 2016-03-19 (×3): qty 1

## 2016-03-19 MED ORDER — DOFETILIDE 500 MCG PO CAPS
500.0000 ug | ORAL_CAPSULE | Freq: Two times a day (BID) | ORAL | Status: DC
  Administered 2016-03-19 – 2016-03-20 (×2): 500 ug via ORAL
  Filled 2016-03-19 (×2): qty 1

## 2016-03-19 MED ORDER — MIDAZOLAM HCL 5 MG/ML IJ SOLN
INTRAMUSCULAR | Status: AC
  Filled 2016-03-19: qty 2

## 2016-03-19 MED ORDER — MELATONIN 3 MG PO TABS
10.0000 mg | ORAL_TABLET | Freq: Every day | ORAL | Status: DC
  Administered 2016-03-19 – 2016-03-21 (×3): 10.5 mg via ORAL
  Filled 2016-03-19 (×3): qty 3.5

## 2016-03-19 MED ORDER — PANTOPRAZOLE SODIUM 40 MG PO TBEC
40.0000 mg | DELAYED_RELEASE_TABLET | Freq: Every day | ORAL | Status: DC
Start: 2016-03-20 — End: 2016-03-22
  Administered 2016-03-20 – 2016-03-22 (×3): 40 mg via ORAL
  Filled 2016-03-19 (×3): qty 1

## 2016-03-19 MED ORDER — CALCIUM POLYCARBOPHIL 625 MG PO TABS
625.0000 mg | ORAL_TABLET | Freq: Every day | ORAL | Status: DC
  Administered 2016-03-19 – 2016-03-22 (×4): 625 mg via ORAL
  Filled 2016-03-19 (×4): qty 1

## 2016-03-19 MED ORDER — SODIUM CHLORIDE 0.9 % IV SOLN: INTRAVENOUS | Status: DC

## 2016-03-19 MED ORDER — IRBESARTAN 150 MG PO TABS
150.0000 mg | ORAL_TABLET | Freq: Every day | ORAL | Status: DC
  Administered 2016-03-20 – 2016-03-22 (×3): 150 mg via ORAL
  Filled 2016-03-19 (×3): qty 1

## 2016-03-19 MED ORDER — POTASSIUM CHLORIDE CRYS ER 20 MEQ PO TBCR
40.0000 meq | EXTENDED_RELEASE_TABLET | Freq: Once | ORAL | Status: AC
  Administered 2016-03-19: 40 meq via ORAL
  Filled 2016-03-19: qty 2

## 2016-03-19 MED ORDER — RIVAROXABAN 20 MG PO TABS
20.0000 mg | ORAL_TABLET | Freq: Every day | ORAL | Status: DC
  Administered 2016-03-19 – 2016-03-21 (×3): 20 mg via ORAL
  Filled 2016-03-19 (×3): qty 1

## 2016-03-19 MED ORDER — MIDAZOLAM HCL 10 MG/2ML IJ SOLN
INTRAMUSCULAR | Status: DC | PRN
Start: 2016-03-19 — End: 2016-03-19
  Administered 2016-03-19 (×2): 2 mg via INTRAVENOUS

## 2016-03-19 MED ORDER — METOPROLOL TARTRATE 25 MG PO TABS
25.0000 mg | ORAL_TABLET | Freq: Two times a day (BID) | ORAL | Status: DC
  Administered 2016-03-19 – 2016-03-22 (×6): 25 mg via ORAL
  Filled 2016-03-19 (×6): qty 1

## 2016-03-19 MED ORDER — MAGNESIUM SULFATE 2 GM/50ML IV SOLN
2.0000 g | Freq: Once | INTRAVENOUS | Status: AC
  Administered 2016-03-19: 2 g via INTRAVENOUS
  Filled 2016-03-19: qty 50

## 2016-03-19 MED ORDER — DIPHENHYDRAMINE HCL 50 MG/ML IJ SOLN
INTRAMUSCULAR | Status: AC
  Filled 2016-03-19: qty 1

## 2016-03-19 MED ORDER — ROSUVASTATIN CALCIUM 10 MG PO TABS
5.0000 mg | ORAL_TABLET | Freq: Every morning | ORAL | Status: DC
  Administered 2016-03-20 – 2016-03-22 (×3): 5 mg via ORAL
  Filled 2016-03-19 (×3): qty 1

## 2016-03-19 MED ORDER — BUTAMBEN-TETRACAINE-BENZOCAINE 2-2-14 % EX AERO
INHALATION_SPRAY | CUTANEOUS | Status: DC | PRN
  Administered 2016-03-19: 2 via TOPICAL

## 2016-03-19 NOTE — Progress Notes (Signed)
Pharmacy Review for Dofetilide (Tikosyn) Initiation  Admit Complaint: 76 y.o. male admitted 03/19/2016 with atrial fibrillation to be initiated on dofetilide.   Patient Exclusion Criteria: If any screening criteria checked as "Yes", then  patient  should NOT receive dofetilide until criteria item is corrected. If "Yes" please indicate correction plan.  YES  NO Patient  Exclusion Criteria Correction Plan  '[x]'$  '[]'$  Baseline QTc interval is greater than or equal to 440 msec. IF above YES box checked dofetilide contraindicated unless patient has ICD; then may proceed if QTc 500-550 msec or with known ventricular conduction abnormalities may proceed with QTc 550-600 msec. QTc =  450 MD aware  '[]'$  '[x]'$  Magnesium level is less than 1.8 mEq/l : Last magnesium:  Lab Results  Component Value Date   MG 1.8 03/19/2016       2gm ordered  '[x]'$  '[]'$  Potassium level is less than 4 mEq/l : Last potassium:  Lab Results  Component Value Date   K 3.9 03/19/2016       30mq ordered  '[]'$  '[x]'$  Patient is known or suspected to have a digoxin level greater than 2 ng/ml: No results found for: DIGOXIN    '[]'$  '[x]'$  Creatinine clearance less than 20 ml/min (calculated using Cockcroft-Gault, actual body weight and serum creatinine): Estimated Creatinine Clearance: 93.2 mL/min (by C-G formula based on SCr of 0.76 mg/dL).    '[]'$  '[x]'$  Patient has received drugs known to prolong the QT intervals within the last 48 hours (phenothiazines, tricyclics or tetracyclic antidepressants, erythromycin, H-1 antihistamines, cisapride, fluoroquinolones, azithromycin). Drugs not listed above may have an, as yet, undetected potential to prolong the QT interval, updated information on QT prolonging agents is available at this website:QT prolonging agents   '[]'$  '[x]'$  Patient received a dose of hydrochlorothiazide (Oretic) alone or in any combination including triamterene (Dyazide, Maxzide) in the last 48 hours.   '[]'$  '[x]'$  Patient received a medication  known to increase dofetilide plasma concentrations prior to initial dofetilide dose:  . Trimethoprim (Primsol, Proloprim) in the last 36 hours . Verapamil (Calan, Verelan) in the last 36 hours or a sustained release dose in the last 72 hours . Megestrol (Megace) in the last 5 days  . Cimetidine (Tagamet) in the last 6 hours . Ketoconazole (Nizoral) in the last 24 hours . Itraconazole (Sporanox) in the last 48 hours  . Prochlorperazine (Compazine) in the last 36 hours    '[]'$  '[x]'$  Patient is known to have a history of torsades de pointes; congenital or acquired long QT syndromes.   '[]'$  '[x]'$  Patient has received a Class 1 antiarrhythmic with less than 2 half-lives since last dose. (Disopyramide, Quinidine, Procainamide, Lidocaine, Mexiletine, Flecainide, Propafenone)   '[]'$  '[x]'$  Patient has received amiodarone therapy in the past 3 months or amiodarone level is greater than 0.3 ng/ml.    Patient has been appropriately anticoagulated with Xarelto. Has missed doses in last 3 weeks. TEE done prior to tikosyn initiation. TEE negative for thrombus.  Ordering provider was confirmed at [email or web address] they are not listed on the [email or web address] list.  Goal of Therapy: Follow renal function, electrolytes, potential drug interactions, and dose adjustment. Provide education and 1 week supply at discharge.  Plan:  '[x]'$   Physician selected initial dose within range recommended for patients level of renal function - will monitor for response.  '[]'$   Physician selected initial dose outside of range recommended for patients level of renal function - will discuss if the dose should be altered at this  time.   Select One Calculated CrCl  Dose q12h  '[x]'$  > 60 ml/min 500 mcg  '[]'$  40-60 ml/min 250 mcg  '[]'$  20-40 ml/min 125 mcg   2. Follow up QTc after the first 5 doses, renal function, electrolytes (K & Mg) daily x 3     days, dose adjustment, success of initiation and facilitate 1 week discharge supply  as     clinically indicated.  3. Initiate Tikosyn education video (Call [phone] and ask for Tikosyn Video # 116).  4. Place Enrollment Form on the chart for discharge supply of dofetilide.   Deboraha Sprang 10:41 AM 03/19/2016

## 2016-03-19 NOTE — Progress Notes (Signed)
  Echocardiogram Echocardiogram Transesophageal has been performed.  George Vega 03/19/2016, 8:45 AM

## 2016-03-19 NOTE — H&P (View-Only) (Signed)
Electrophysiology Office Note Date: 03/16/2016  ID:  George Vega, DOB 25-Sep-1940, MRN [id number]  PCP: [name], MD Primary Cardiologist: Einar Gip Electrophysiologist: Lovena Le  CC: SVT follow up  George Vega is a 76 y.o. male seen today for Dr Lovena Le.  He presents today for routine electrophysiology followup.  Since last being seen in our clinic, the patient reports increased shortness of breath and exercise intolerance. He is largely unaware of palpitations.  He denies chest pain, palpitations, PND, orthopnea, nausea, vomiting, dizziness, syncope, edema, weight gain, or early satiety.  Past Medical History:  Diagnosis Date  . Arthritis   . Chronic sinusitis   . Coronary artery disease   . Depression   . Difficult intubation    was told with shoulder done 2006-alittle narrow  . 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 Hypertension   . Jejunostomy tube fell out   . 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 Spastic colon   . Stroke (cerebrum) ([place])   . SVT (supraventricular tachycardia) ([facility])    Past Surgical History:  Procedure Laterality Date  . FOOT NEUROMA SURGERY  2002  . LEFT HEART CATHETERIZATION WITH CORONARY ANGIOGRAM Right 06/14/2011   20% LM, chronic occluded mid LAD, 50% ostial LCX, 20% mid RI, RCA with collaterals to mid LAD, mid 10% stenosis, EF 60% 06/14/11  . NASAL SINUS SURGERY    . SHOULDER ARTHROSCOPY  03/01/2011   Procedure: ARTHROSCOPY SHOULDER;  Surgeon: Ninetta Lights, MD;  Location: [facility];  Service: Orthopedics;  Laterality: Right;  arthroscopy shoulder decompression subacromial partial acromioplasty with coracoacromial release, distal claviculectomy, debridement of labrium  . SHOULDER ARTHROSCOPY  03/01/2011   Procedure: ARTHROSCOPY SHOULDER;  Surgeon: Ninetta Lights, MD;  Location: [facility];  Service: Orthopedics;  Laterality: Right;  arthroscopy shoulder decompression subacromial partial  acromioplasty with coracoacromial release, distal claviculectomy, debridement of labrium  . TOTAL KNEE ARTHROPLASTY Right 09/23/2013   Procedure: TOTAL KNEE ARTHROPLASTY;  Surgeon: Ninetta Lights, MD;  Location: [facility];  Service: Orthopedics;  Laterality: Right;  . TOTAL SHOULDER ARTHROPLASTY  06/28/2011   Procedure: TOTAL SHOULDER ARTHROPLASTY;  Surgeon: Ninetta Lights, MD;  Location: [facility];  Service: Orthopedics;  Laterality: Right;  . UVULOPALATOPHARYNGOPLASTY      Current Outpatient Prescriptions  Medication Sig Dispense Refill  . acetaminophen (TYLENOL) 325 MG tablet Take 650 mg by mouth every 6 (six) hours as needed for moderate pain.    Marland Kitchen amLODipine (NORVASC) 5 MG tablet Take 1 tablet by mouth daily.    . Cholecalciferol (VITAMIN D3) 5000 units TABS Take 5,000 Units by mouth daily.    . Cyanocobalamin (VITAMIN B-12) 500 MCG SUBL Place 1 tablet under the tongue 3 (three) times a week.    . Melatonin 10 MG TABS Take 10 mg by mouth at bedtime.     . metoprolol tartrate (LOPRESSOR) 25 MG tablet Take 25 mg by mouth 2 (two) times daily.    Marland Kitchen omeprazole (PRILOSEC) 40 MG capsule Take 1 capsule by mouth every morning.    Marland Kitchen PARoxetine (PAXIL) 20 MG tablet Take 20 mg by mouth every evening.      . polycarbophil (FIBERCON) 625 MG tablet Take 625 mg by mouth daily.      . rosuvastatin (CRESTOR) 5 MG tablet Take 5 mg by mouth every morning.     . valsartan (DIOVAN) 160 MG tablet Take 1 tablet by mouth daily.    Marland Kitchen  XARELTO 20 MG TABS tablet Take 1 tablet (20 mg total) by mouth daily after supper. 90 tablet 3   No current facility-administered medications for this visit.     Allergies:   Tetanus toxoid; Fish oil; Ibuprofen; Naproxen; and Glucosamine-chondroitin   Social History: Social History   Social History  . Marital status: Married    Spouse name: N/A  . Number of children: 3  . Years of education: N/A   Occupational History  . retired    Social History Main  Topics  . Smoking status: Former Smoker    Packs/day: 2.00    Years: 30.00    Types: Cigarettes    Quit date: 02/27/1998  . Smokeless tobacco: Never Used  . Alcohol use 8.4 oz/week    14 Standard drinks or equivalent per week     Comment: daily  . Drug use: No  . Sexual activity: Not on file   Other Topics Concern  . Not on file   Social History Narrative   Patient is left handed.   Patient drinks one cup caffeine daily.    Family History: Family History  Problem Relation Age of Onset  . Cancer Mother     colon  . Heart attack Father   . Migraines Brother     Review of Systems: All other systems reviewed and are otherwise negative except as noted above.   Physical Exam: VS:  BP 118/86   Pulse (!) 127   Ht '5\' 10"'$  (1.778 m)   Wt 214 lb 12.8 oz (97.4 kg)   BMI 30.82 kg/m  , BMI Body mass index is 30.82 kg/m. Wt Readings from Last 3 Encounters:  03/15/16 214 lb 12.8 oz (97.4 kg)  01/20/16 218 lb 9.6 oz (99.2 kg)  12/14/15 212 lb (96.2 kg)    GEN- The patient is well appearing, alert and oriented x 3 today.   HEENT: normocephalic, atraumatic; sclera clear, conjunctiva pink; hearing intact; oropharynx clear; neck supple Lungs- Clear to ausculation bilaterally, normal work of breathing.  No wheezes, rales, rhonchi Heart- Tachycardic regular rate and rhythm GI- soft, non-tender, non-distended, bowel sounds present Extremities- no clubbing, cyanosis, or edema; DP/PT/radial pulses 2+ bilaterally MS- no significant deformity or atrophy Skin- warm and dry, no rash or lesion  Psych- euthymic mood, full affect Neuro- strength and sensation are intact   EKG:  EKG is ordered today. EKG today shows 2:1 atrial flutter, QTc 470mec (440-4682mc in SR)  Recent Labs: 09/08/2015: ALT 48; TSH 1.638 01/20/2016: Hemoglobin 14.0 03/15/2016: BUN 10; Creatinine, Ser 0.90; Magnesium 2.0; NT-Pro BNP 195; Platelets 215; Potassium 4.2; Sodium 138    Other studies  Reviewed: Additional studies/ records that were reviewed today include: Dr TaTanna Furryffice notes  Assessment and Plan: 1.  Persistent atrial fibrillation/atrial tachycardia/atrial flutter He has developed shortness of breath and exercise intolerance with persistent atrial arrhythmias. He has previously failed Sotalol and there are some comments in notes about concerns for bradycardia. I think he would best be served with a rhythm control strategy at this point. We have discussed AAD options. I think that Tikosyn is reasonable at this point. Unfortunately, he has missed some doses of Xarelto in the last 3 weeks and will require TEE prior to attempts at restoring SR. Will plan TEE on Monday followed by admission for Tikosyn.  Will obtain labs today and ask pharmacy to review medication list/pharmacy coverage. Risks, benefits to TEE and Tikosyn reviewed with patient and wife today.  With several different atrial  arrhythmias, I think success rates with ablation are reduced.  Continue Xarelto for CHADS2VASC of 5  2.  CAD No recent ischemic symptoms Continue medical therapy  3.  HTN Stable No change required today    Current medicines are reviewed at length with the patient today.   The patient does not have concerns regarding his medicines.  The following changes were made today:  none  Labs/ tests ordered today include: none Orders Placed This Encounter  Procedures  . Magnesium  . Basic metabolic panel  . CBC w/Diff  . Pro b natriuretic peptide  . EKG 12-Lead     Disposition:   Follow up with EP following Tikosyn admission    Signed, Chanetta Marshall, NP 03/16/2016 6:55 AM   [place] [address] [phone] (office) [phone] (fax)

## 2016-03-19 NOTE — CV Procedure (Signed)
Procedure: TEE  Indication: Atrial fibrillation, pre-Tikosyn admission  Sedation: Versed 4 mg IV, Fentanyl 50 mcg IV  Findings:  Please see echo section for full report.  Normal LV size with mild LV hypertrophy.  EF 55%, no regional wall motion abnormalities.  Normal RV size and systolic function.  Moderate tricuspid regurgitation, peak RV-RA gradient 27 mmHg.  Mild mitral regurgitation.  Trileaflet aortic valve with no stenosis or regurgitation.  Mild to moderate biatrial enlargement.  No LA appendage thrombus.  Normal caliber aorta with grade III plaque descending thoracic aorta.    May be admitted now to start Tikosyn.   Loralie Champagne 03/19/2016 8:40 AM

## 2016-03-19 NOTE — Interval H&P Note (Signed)
History and Physical Interval Note:  03/19/2016 8:22 AM  George Vega  has presented today for surgery, with the diagnosis of AFIB  The various methods of treatment have been discussed with the patient and family. After consideration of risks, benefits and other options for treatment, the patient has consented to  Procedure(s): TRANSESOPHAGEAL ECHOCARDIOGRAM (TEE) (N/A) as a surgical intervention .  The patient's history has been reviewed, patient examined, no change in status, stable for surgery.  I have reviewed the patient's chart and labs.  Questions were answered to the patient's satisfaction.     Milburn Freeney [employer]

## 2016-03-19 NOTE — H&P (Signed)
Electrophysiology Office Note Date: 03/16/2016  ID:  George Vega, DOB [birth year], MRN [id number]  PCP: [name], MD Primary Cardiologist: Einar Gip Electrophysiologist: Lovena Le  CC: here for TEE and Tikosyn loading   George Vega is a 76 y.o. male seen with a past medical history as below. He was seen in the office last week with worsening shortness of breath and exercise intolerance.  Plans were made for TEE/Tikosyn loading as he has missed some doses of Xarelto in last few weeks. He is largely unaware of palpitations.  He denies chest pain, palpitations, PND, orthopnea, nausea, vomiting, dizziness, syncope, edema, weight gain, or early satiety.      Past Medical History:  Diagnosis Date  . Arthritis   . Chronic sinusitis   . Coronary artery disease   . Depression   . Difficult intubation    was told with shoulder done 2006-alittle narrow  . 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 Hypertension   . Jejunostomy tube fell out   . 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 Spastic colon   . Stroke (cerebrum) ([place])   . SVT (supraventricular tachycardia) ([facility])         Past Surgical History:  Procedure Laterality Date  . FOOT NEUROMA SURGERY  2002  . LEFT HEART CATHETERIZATION WITH CORONARY ANGIOGRAM Right 06/14/2011   20% LM, chronic occluded mid LAD, 50% ostial LCX, 20% mid RI, RCA with collaterals to mid LAD, mid 10% stenosis, EF 60% 06/14/11  . NASAL SINUS SURGERY    . SHOULDER ARTHROSCOPY  03/01/2011   Procedure: ARTHROSCOPY SHOULDER;  Surgeon: Ninetta Lights, MD;  Location: [place];  Service: Orthopedics;  Laterality: Right;  arthroscopy shoulder decompression subacromial partial acromioplasty with coracoacromial release, distal claviculectomy, debridement of labrium  . SHOULDER ARTHROSCOPY  03/01/2011   Procedure: ARTHROSCOPY SHOULDER;  Surgeon: Ninetta Lights, MD;  Location: [facility] [place];   Service: Orthopedics;  Laterality: Right;  arthroscopy shoulder decompression subacromial partial acromioplasty with coracoacromial release, distal claviculectomy, debridement of labrium  . TOTAL KNEE ARTHROPLASTY Right 09/23/2013   Procedure: TOTAL KNEE ARTHROPLASTY;  Surgeon: Ninetta Lights, MD;  Location: [place];  Service: Orthopedics;  Laterality: Right;  . TOTAL SHOULDER ARTHROPLASTY  06/28/2011   Procedure: TOTAL SHOULDER ARTHROPLASTY;  Surgeon: Ninetta Lights, MD;  Location: [facility];  Service: Orthopedics;  Laterality: Right;  . UVULOPALATOPHARYNGOPLASTY      Current Outpatient Prescriptions  Medication Sig Dispense Refill  . acetaminophen (TYLENOL) 325 MG tablet Take 650 mg by mouth every 6 (six) hours as needed for moderate pain.    Marland Kitchen amLODipine (NORVASC) 5 MG tablet Take 1 tablet by mouth daily.    . Cholecalciferol (VITAMIN D3) 5000 units TABS Take 5,000 Units by mouth daily.    . Cyanocobalamin (VITAMIN B-12) 500 MCG SUBL Place 1 tablet under the tongue 3 (three) times a week.    . Melatonin 10 MG TABS Take 10 mg by mouth at bedtime.     . metoprolol tartrate (LOPRESSOR) 25 MG tablet Take 25 mg by mouth 2 (two) times daily.    Marland Kitchen omeprazole (PRILOSEC) 40 MG capsule Take 1 capsule by mouth every morning.    Marland Kitchen PARoxetine (PAXIL) 20 MG tablet Take 20 mg by mouth every evening.      . polycarbophil (FIBERCON) 625 MG tablet Take 625 mg by mouth daily.      . rosuvastatin (CRESTOR) 5 MG tablet Take  5 mg by mouth every morning.     . valsartan (DIOVAN) 160 MG tablet Take 1 tablet by mouth daily.    Alveda Reasons 20 MG TABS tablet Take 1 tablet (20 mg total) by mouth daily after supper. 90 tablet 3   No current facility-administered medications for this visit.     Allergies:   Tetanus toxoid; Fish oil; Ibuprofen; Naproxen; and Glucosamine-chondroitin   Social History: Social History        Social History  . Marital status: Married     Spouse name: N/A  . Number of children: 3  . Years of education: N/A       Occupational History  . retired          Social History Main Topics  . Smoking status: Former Smoker    Packs/day: 2.00    Years: 30.00    Types: Cigarettes    Quit date: 02/27/1998  . Smokeless tobacco: Never Used  . Alcohol use 8.4 oz/week    14 Standard drinks or equivalent per week     Comment: daily  . Drug use: No  . Sexual activity: Not on file       Other Topics Concern  . Not on file      Social History Narrative   Patient is left handed.   Patient drinks one cup caffeine daily.    Family History:       Family History  Problem Relation Age of Onset  . Cancer Mother     colon  . Heart attack Father   . Migraines Brother     Review of Systems: All other systems reviewed and are otherwise negative except as noted above.   Physical Exam: BP (!) 139/103   Pulse (!) 127   Temp 98.4 F (36.9 C) (Oral)   Resp 20   SpO2 96%   GEN- The patient is well appearing, alert and oriented x 3 today.   HEENT: normocephalic, atraumatic; sclera clear, conjunctiva pink; hearing intact; oropharynx clear; neck supple Lungs- Clear to ausculation bilaterally, normal work of breathing.  No wheezes, rales, rhonchi Heart- Tachycardic regular rate and rhythm GI- soft, non-tender, non-distended, bowel sounds present Extremities- no clubbing, cyanosis, or edema; DP/PT/radial pulses 2+ bilaterally MS- no significant deformity or atrophy Skin- warm and dry, no rash or lesion  Psych- euthymic mood, full affect Neuro- strength and sensation are intact   EKG:  EKG from 1/4/18shows 2:1 atrial flutter, QTc 462mec (440-4673mc in SR)  Recent Labs: 09/08/2015: ALT 48; TSH 1.638 01/20/2016: Hemoglobin 14.0 03/15/2016: BUN 10; Creatinine, Ser 0.90; Magnesium 2.0; NT-Pro BNP 195; Platelets 215; Potassium 4.2; Sodium 138    Other studies Reviewed: Additional studies/  records that were reviewed today include: Dr TaTanna Furryffice notes  Assessment and Plan: 1.  Persistent atrial fibrillation/atrial tachycardia/atrial flutter He has developed shortness of breath and exercise intolerance with persistent atrial arrhythmias. He has previously failed Sotalol and there are some comments in notes about concerns for bradycardia. I think he would best be served with a rhythm control strategy at this point. We have discussed AAD options. I think that Tikosyn is reasonable at this point. Unfortunately, he has missed some doses of Xarelto in the last 3 weeks and will require TEE prior to attempts at restoring SR. Will plan TEE today followed by admission for Tikosyn. Risks, benefits to TEE and Tikosyn reviewed with patient and wife today.  With several different atrial arrhythmias, I think success rates with ablation are  reduced.  Continue Xarelto for CHADS2VASC of 5 BMET/Mg pending  2.  CAD No recent ischemic symptoms Continue medical therapy  3.  HTN Stable No change required today    Signed, Chanetta Marshall, NP 03/19/2016 7:37 AM  EP attending  Patient seen and examined. I've reviewed the findings as documented above, and concur with the findings documented on the history, exam, assessment, and plan. The patient has developed symptomatic atrial fibrillation and worsening heart failure symptoms. He has undergone transesophageal echo which demonstrated no left atrial appendage thrombus. He will be admitted to the hospital and dofetilide therapy will be initiated. We will follow his electrolytes and QT interval very carefully. If he does not revert back to sinus rhythm, we will plan cardioversion in 2 days.  Cristopher Peru, M.D.

## 2016-03-19 NOTE — Progress Notes (Signed)
Insurance check for [employer] completed S/W CHRIS  @ [place] # [phone]   1. TIKOSYN 500 MCG BID  **  NOT COVER **   2 . DOFETILIDE 500 MCG BID  COVER- YES  CO-PAY - 40 % OF COAST -TOTAL COAST $360.46  PATIENT WILL PAY $144.18  TIER- 4 DRUG  PRIOR APPROVAL- NO   * FOR TIER EXCEPTION # [phone] PATIENT COST WILL BE $37.00 FOR 30 DAY SUPPLY YOU MUST CALL FOR EXCEPTION *   PHARMACY : [facility] - [facility]

## 2016-03-20 LAB — BASIC METABOLIC PANEL
ANION GAP: 6 (ref 5–15)
BUN: 13 mg/dL (ref 6–20)
CALCIUM: 9.1 mg/dL (ref 8.9–10.3)
CO2: 26 mmol/L (ref 22–32)
CREATININE: 0.72 mg/dL (ref 0.61–1.24)
Chloride: 107 mmol/L (ref 101–111)
GFR calc Af Amer: >60 mL/min (ref >60)
GFR calc non Af Amer: >60 mL/min (ref >60)
GLUCOSE: 106 mg/dL — AB (ref 65–99)
Potassium: 4.3 mmol/L (ref 3.5–5.1)
Sodium: 139 mmol/L (ref 135–145)

## 2016-03-20 LAB — MAGNESIUM: Magnesium: 1.9 mg/dL (ref 1.7–2.4)

## 2016-03-20 MED ORDER — DOFETILIDE 250 MCG PO CAPS
250.0000 ug | ORAL_CAPSULE | Freq: Two times a day (BID) | ORAL | Status: DC
  Administered 2016-03-20 – 2016-03-22 (×4): 250 ug via ORAL
  Filled 2016-03-20 (×4): qty 1

## 2016-03-20 NOTE — Progress Notes (Signed)
I was checking the monitor and notice patient had converted to [employer] [facility]. R.N. Aware. called [facility] monitoring they check and he converted around 03:40 a.m. EKG done to confirm . I was not inform of this .

## 2016-03-20 NOTE — Progress Notes (Signed)
QT prolonged on EKG post Tikosyn this morning. Will reduce dose to 266mg twice daily and follow.  AChanetta Marshall NP 03/20/2016 12:25 PM   EP Attending  Agree with above.   GMikle BosworthD.

## 2016-03-20 NOTE — Care Management Note (Signed)
Case Management Note Marvetta Gibbons RN, BSN Unit 2W-Case Manager [phone] Patient Details  Name: George Vega MRN: [id number] Date of Birth: [birth year]  Subjective/Objective:    Pt admitted with afib for Tikosyn load                Action/Plan: PTA pt lived at home with wife- anticipate return home- referral for Tikosyn needs received- per insurance check- S/W CHRIS  @ [place] # [phone]   1. TIKOSYN 500 MCG BID  **  NOT COVER **   2 . DOFETILIDE 500 MCG BID  COVER- YES  CO-PAY - 40 % OF COAST -TOTAL COAST $360.46  PATIENT WILL PAY $144.18  TIER- 4 DRUG  PRIOR APPROVAL- NO   * FOR TIER EXCEPTION # [id number] PATIENT COST WILL BE $37.00 FOR 30 DAY SUPPLY YOU MUST CALL FOR EXCEPTION *    Expected Discharge Date:                  Expected Discharge Plan:  Home/Self Care  In-House Referral:     Discharge planning Services  CM Consult, Medication Assistance  Post Acute Care Choice:    Choice offered to:     DME Arranged:    DME Agency:     HH Arranged:    HH Agency:     Status of Service:  Completed, signed off  If discussed at [employer], dates discussed:    Additional Comments:  03/20/16- 1445- Marvetta Gibbons RN, CM- spoke with pt at bedside- tikosyn coverage shared- pt aware of Tier Exception- that he states PA spoke to him about- per pt he uses [facility] (corner of [place] and [address]) for local fills- and he prefers [facility] order for long term medications- pt will need 7 day supply on discharge from [facility] and script to take to [facility] to have them order- pt would also like script for 3 mo supply to send to [facility] to get his mail order started for long term.   Dawayne Patricia, RN 03/20/2016, 2:45 PM

## 2016-03-20 NOTE — Progress Notes (Signed)
SUBJECTIVE: The patient is doing well today.  At this time, he denies chest pain, shortness of breath, or any new concerns. He is symptomatically improved in SR  CURRENT MEDICATIONS: . amLODipine  5 mg Oral Daily  . dofetilide  500 mcg Oral BID  . irbesartan  150 mg Oral Daily  . Melatonin  10.5 mg Oral QHS  . metoprolol tartrate  25 mg Oral BID  . pantoprazole  40 mg Oral Daily  . PARoxetine  20 mg Oral QPM  . polycarbophil  625 mg Oral Daily  . rivaroxaban  20 mg Oral QPC supper  . rosuvastatin  5 mg Oral q morning - 10a     OBJECTIVE: Physical Exam: Vitals:   03/19/16 0900 03/19/16 0928 03/19/16 2100 03/20/16 0445  BP: (!) 150/102 118/89 (!) 131/99 107/73  Pulse: (!) 125 (!) 124 (!) 110 75  Resp: (!) '28  18 20  '$ Temp:  98.1 F (36.7 C) 97.5 F (36.4 C) 97.7 F (36.5 C)  TempSrc:  Oral Oral Oral  SpO2: 92% 94% 97% 95%  Weight:  214 lb (97.1 kg)    Height:  '5\' 10"'$  (1.778 m)      Intake/Output Summary (Last 24 hours) at 03/20/16 0902 Last data filed at 03/20/16 0500  Gross per 24 hour  Intake              540 ml  Output                0 ml  Net              540 ml    Telemetry reveals sinus rhythm  GEN- The patient is well appearing, alert and oriented x 3 today.   Head- normocephalic, atraumatic Eyes-  Sclera clear, conjunctiva pink Ears- hearing intact Oropharynx- clear Neck- supple  Lungs- Clear to ausculation bilaterally, normal work of breathing Heart- Regular rate and rhythm, no murmurs, rubs or gallops GI- soft, NT, ND, + BS Extremities- no clubbing, cyanosis, or edema Skin- no rash or lesion Psych- euthymic mood, full affect Neuro- strength and sensation are intact  LABS: Basic Metabolic Panel:  Recent Labs  03/19/16 0737  03/19/16 1839 03/20/16 0252  NA 141  < > 139 139  K 3.9  < > 4.3 4.3  CL 106  < > 105 107  CO2 27  < > 28 26  GLUCOSE 116*  < > 114* 106*  BUN 10  < > 13 13  CREATININE 0.76  < > 0.87 0.72  CALCIUM 9.1  < > 9.1  9.1  MG 1.8  --   --  1.9  < > = values in this interval not displayed.   ASSESSMENT AND PLAN:  Active Problems:   Persistent atrial fibrillation ([place])  1. Persistent atrial fibrillation/atrial tachycardia/atrial flutter Converted to SR after 1st dose of Tikosyn QTc 435mec by manual calculation this morning Continue Tikosyn 5050m twice daily - if QTc prolongs, will reduce to 25085mtwice daily  With several different atrial arrhythmias, I think success rates with ablation are reduced.  Continue Xarelto for CHADS2VASC of 5 BMET/Mg stable  2. CAD No recent ischemic symptoms Continue medical therapy  3. HTN Stable No change required today  AmbChanetta MarshallP 03/20/2016 9:04 AM  EP Attending  Patient seen and examined. Agree with the findings as above. He has reverted to NSR. Exam reveals a RRR, lungs are clear and extremities with no edema. ECG shows a borderline  prolongation of QTC. If next ECG still prolonged then will reduce dofetilide to 250 bid.  Mikle Bosworth.D.

## 2016-03-21 ENCOUNTER — Encounter (HOSPITAL_COMMUNITY)
Admission: RE | Disposition: A | Discharge status: Home / Self Care | Payer: Self-pay | Source: Ambulatory Visit | Attending: Internal Medicine

## 2016-03-21 LAB — BASIC METABOLIC PANEL
ANION GAP: 6 (ref 5–15)
BUN: 14 mg/dL (ref 6–20)
CO2: 28 mmol/L (ref 22–32)
Calcium: 9.1 mg/dL (ref 8.9–10.3)
Chloride: 104 mmol/L (ref 101–111)
Creatinine, Ser: 0.77 mg/dL (ref 0.61–1.24)
GFR calc Af Amer: >60 mL/min (ref >60)
Glucose, Bld: 112 mg/dL — ABNORMAL HIGH (ref 65–99)
POTASSIUM: 4.1 mmol/L (ref 3.5–5.1)
SODIUM: 138 mmol/L (ref 135–145)

## 2016-03-21 LAB — MAGNESIUM: MAGNESIUM: 2 mg/dL (ref 1.7–2.4)

## 2016-03-21 SURGERY — CARDIOVERSION
Anesthesia: Monitor Anesthesia Care

## 2016-03-21 NOTE — Progress Notes (Signed)
    SUBJECTIVE: The patient is doing well today.  At this time, he denies chest pain, shortness of breath, or any new concerns. He is symptomatically improved in SR  CURRENT MEDICATIONS: . amLODipine  5 mg Oral Daily  . dofetilide  250 mcg Oral BID  . irbesartan  150 mg Oral Daily  . Melatonin  10.5 mg Oral QHS  . metoprolol tartrate  25 mg Oral BID  . pantoprazole  40 mg Oral Daily  . PARoxetine  20 mg Oral QPM  . polycarbophil  625 mg Oral Daily  . rivaroxaban  20 mg Oral QPC supper  . rosuvastatin  5 mg Oral q morning - 10a     OBJECTIVE: Physical Exam: Vitals:   03/20/16 1014 03/20/16 1300 03/20/16 1952 03/21/16 0352  BP: 96/67 106/72 104/79 113/74  Pulse: 74 73 79 73  Resp:  '20 20 20  '$ Temp:  98 F (36.7 C) 97.8 F (36.6 C) 97.9 F (36.6 C)  TempSrc:  Oral Oral Oral  SpO2:  94% 97% 93%  Weight:      Height:       No intake or output data in the 24 hours ending 03/21/16 0641  Telemetry reveals sinus rhythm  GEN- The patient is well appearing, alert and oriented x 3 today.   Head- normocephalic, atraumatic Eyes-  Sclera clear, conjunctiva pink Ears- hearing intact Oropharynx- clear Neck- supple  Lungs- Clear to ausculation bilaterally, normal work of breathing Heart- Regular rate and rhythm, no murmurs, rubs or gallops GI- soft, NT, ND, + BS Extremities- no clubbing, cyanosis, or edema Skin- no rash or lesion Psych- euthymic mood, full affect Neuro- strength and sensation are intact  LABS: Basic Metabolic Panel:  Recent Labs  03/20/16 0252 03/21/16 0343  NA 139 138  K 4.3 4.1  CL 107 104  CO2 26 28  GLUCOSE 106* 112*  BUN 13 14  CREATININE 0.72 0.77  CALCIUM 9.1 9.1  MG 1.9 2.0     ASSESSMENT AND PLAN:  Active Problems:   Persistent atrial fibrillation ([facility])  1. Persistent atrial fibrillation/atrial tachycardia/atrial flutter Converted to SR after 1st dose of Tikosyn QTc improved on lower dose of Tikosyn  With several different atrial  arrhythmias, I think success rates with ablation are reduced.  Continue Xarelto for CHADS2VASC of 5 BMET/Mg stable  2. CAD No recent ischemic symptoms Continue medical therapy  3. HTN Stable No change required today  Chanetta Marshall, NP 03/21/2016 6:41 AM   EP Attending  Patient seen and examined. A RRR with clear lungs on exam. No edema. Tele reveals NSR and ECG shows QTC of 480 after reduction of the Tikosyn dose. Will observe today and plan to DC home tomorrow after a.m. Dose of dofetilide.  Mikle Bosworth.D.

## 2016-03-22 LAB — BASIC METABOLIC PANEL
ANION GAP: 8 (ref 5–15)
BUN: 12 mg/dL (ref 6–20)
CALCIUM: 8.8 mg/dL — AB (ref 8.9–10.3)
CO2: 25 mmol/L (ref 22–32)
Chloride: 104 mmol/L (ref 101–111)
Creatinine, Ser: 0.8 mg/dL (ref 0.61–1.24)
Glucose, Bld: 101 mg/dL — ABNORMAL HIGH (ref 65–99)
POTASSIUM: 4.4 mmol/L (ref 3.5–5.1)
SODIUM: 137 mmol/L (ref 135–145)

## 2016-03-22 LAB — MAGNESIUM: Magnesium: 1.8 mg/dL (ref 1.7–2.4)

## 2016-03-22 MED ORDER — VALSARTAN 160 MG PO TABS: 160.0000 mg | ORAL_TABLET | Freq: Every day | ORAL | 6 refills | Status: DC

## 2016-03-22 MED ORDER — MAGNESIUM OXIDE 400 (241.3 MG) MG PO TABS
400.0000 mg | ORAL_TABLET | Freq: Every day | ORAL | Status: DC
  Administered 2016-03-22: 400 mg via ORAL
  Filled 2016-03-22: qty 1

## 2016-03-22 MED ORDER — DOFETILIDE 250 MCG PO CAPS: 250.0000 ug | ORAL_CAPSULE | Freq: Two times a day (BID) | ORAL | 3 refills | Status: DC

## 2016-03-22 MED ORDER — MAGNESIUM OXIDE 400 (241.3 MG) MG PO TABS: 400.0000 mg | ORAL_TABLET | Freq: Every day | ORAL | 3 refills | Status: DC

## 2016-03-22 NOTE — Discharge Instructions (Signed)

## 2016-03-22 NOTE — Discharge Summary (Signed)
ELECTROPHYSIOLOGY PROCEDURE DISCHARGE SUMMARY    Patient ID: George Vega,  MRN: [id number], DOB/AGE: [birth year] 76 y.o.  Admit date: 03/19/2016 Discharge date: 03/22/2016  Primary Care Physician: [name], MD Primary Cardiologist: Dr. Einar Gip Electrophysiologist: Dr. Lovena Le  Primary Discharge Diagnosis:  1.  Persistent atrial fibrillation status post Tikosyn loading this admission      CHA2DS2Vasc is 5, on Xarelto  Secondary Discharge Diagnosis:  1. CAD 2. HTN  Allergies  Allergen Reactions  . Tetanus Toxoid Anaphylaxis, Hives and Other (See Comments)    REACTION: hives/throat swells shut  . Fish Oil Other (See Comments)    Stomach cramps, can't eat   . Glucosamine-Chondroitin Other (See Comments)    Stomach cramps, can't eat  . Ibuprofen Nausea Only and Other (See Comments)    Stomach pains  . Naproxen Diarrhea     Procedures This Admission:  1.  Tikosyn loading 2.  03/19/16: TEE.        Findings:  Please see echo section for full report.  Normal LV size with mild LV hypertrophy.  EF 55%, no regional wall motion abnormalities.  Normal RV size and systolic function.  Moderate tricuspid regurgitation, peak RV-RA gradient 27 mmHg.  Mild mitral regurgitation.  Trileaflet aortic valve with no stenosis or regurgitation.  Mild to moderate biatrial enlargement.  No LA appendage thrombus.  Normal caliber aorta with grade III plaque descending thoracic aorta.     Brief HPI: George Vega is a 76 y.o. male with a past medical history as noted above.  They were referred to [facility] by Dr. Einar Gip for TEE r/o and Tikosyn load for treatment options of atrial fibrillation.  Risks, benefits, and alternatives to Tikosyn were reviewed with the patient who wished to proceed.    Hospital Course:  The patient was admitted and underwent TEE that r/o thrombus andTikosyn was initiated.  Renal function and electrolytes were followed during the hospitalization.  Their QTc was prolonged  and his dose reduced to 248mg BID and remained stable at that dose.  He converted to SR after his 1st dose of Tiksoyn  They were monitored until discharge on telemetry which demonstrated SR.  On the day of discharge, they were examined by Dr TLovena Lewho considered thim stable for discharge to home.  Follow-up has been arranged with the AFib clinic in 1 week for visit, EKG and labs, and with Dr TLovena Lein 4 weeks.  Physical Exam: Vitals:   03/21/16 2053 03/21/16 2121 03/22/16 0657 03/22/16 0800  BP:  111/73 121/70 120/89  Pulse: 84 77 75 81  Resp:  19 15   Temp:  97.5 F (36.4 C) 98 F (36.7 C)   TempSrc:  Oral Oral   SpO2:  97% 96%   Weight:      Height:        GEN- The patient is well appearing, alert and oriented x 3 today.   HEENT: normocephalic, atraumatic; sclera clear, conjunctiva pink; hearing intact; oropharynx clear; neck supple, no JVP Lymph- no cervical lymphadenopathy Lungs- Clear to ausculation bilaterally, normal work of breathing.  No wheezes, rales, rhonchi Heart- Regular rate and rhythm, no murmurs, rubs or gallops, PMI not laterally displaced GI- soft, non-tender, non-distended Extremities- no clubbing, cyanosis, or edema MS- no significant deformity or atrophy Skin- warm and dry, no rash or lesion Psych- euthymic mood, full affect Neuro- strength and sensation are intact   Labs:   Lab Results  Component Value Date   WBC 9.9  03/15/2016   HGB 14.0 01/20/2016   HCT 41.3 03/15/2016   MCV 93 03/15/2016   PLT 215 03/15/2016     Recent Labs Lab 03/22/16 0354  NA 137  K 4.4  CL 104  CO2 25  BUN 12  CREATININE 0.80  CALCIUM 8.8*  GLUCOSE 101*     Discharge Medications:  Allergies as of 03/22/2016      Reactions   Tetanus Toxoid Anaphylaxis, Hives, Other (See Comments)   REACTION: hives/throat swells shut   Fish Oil Other (See Comments)   Stomach cramps, can't eat   Glucosamine-chondroitin Other (See Comments)   Stomach cramps, can't eat    Ibuprofen Nausea Only, Other (See Comments)   Stomach pains   Naproxen Diarrhea      Medication List    TAKE these medications   acetaminophen 325 MG tablet Commonly known as:  TYLENOL Take 650 mg by mouth every 6 (six) hours as needed for moderate pain.   amLODipine 5 MG tablet Commonly known as:  NORVASC Take 5 mg by mouth daily.   dofetilide 250 MCG capsule Commonly known as:  TIKOSYN Take 1 capsule (250 mcg total) by mouth 2 (two) times daily.   magnesium oxide 400 (241.3 Mg) MG tablet Commonly known as:  MAG-OX Take 1 tablet (400 mg total) by mouth daily.   Melatonin 10 MG Tabs Take 10 mg by mouth at bedtime.   metoprolol tartrate 25 MG tablet Commonly known as:  LOPRESSOR Take 25 mg by mouth 2 (two) times daily.   omeprazole 40 MG capsule Commonly known as:  PRILOSEC Take 40 mg by mouth every morning.   PARoxetine 20 MG tablet Commonly known as:  PAXIL Take 20 mg by mouth every evening.   polycarbophil 625 MG tablet Commonly known as:  FIBERCON Take 625 mg by mouth daily.   PRESERVISION AREDS 2 PO Take 1 capsule by mouth 2 (two) times daily.   rosuvastatin 5 MG tablet Commonly known as:  CRESTOR Take 5 mg by mouth every morning.   SYSTANE OP Place 2 drops into both eyes 2 (two) times daily.   valsartan 160 MG tablet Commonly known as:  DIOVAN Take 1 tablet (160 mg total) by mouth daily.   Vitamin B-12 500 MCG Subl Place 500 mcg under the tongue 3 (three) times a week.   Vitamin D3 5000 units Tabs Take 5,000 Units by mouth daily.   XARELTO 20 MG Tabs tablet Generic drug:  rivaroxaban Take 1 tablet (20 mg total) by mouth daily after supper.       Disposition:  home  Follow-up Information    [facility] Follow up on 03/29/2016.   Specialty:  Cardiology Why:  11:30AM Contact information: [address] [id number] mc [place]       Cristopher Peru, MD Follow up on  04/25/2016.   Specialty:  Cardiology Why:  9:15AM  Contact information: [address] [place] [address] [phone]           Duration of Discharge Encounter: Greater than 30 minutes including physician time.  Venetia Night, PA-C 03/22/2016 11:37 AM  EP Attending  Patient seen and examined. Agree with above. He is stable for DC. Usual followup.  Mikle Bosworth.D.

## 2016-03-28 ENCOUNTER — Encounter (INDEPENDENT_AMBULATORY_CARE_PROVIDER_SITE_OTHER): Payer: Medicare Other | Admitting: Ophthalmology

## 2016-03-29 ENCOUNTER — Ambulatory Visit (HOSPITAL_COMMUNITY): Payer: Medicare Other | Admitting: Nurse Practitioner

## 2016-04-04 ENCOUNTER — Encounter (INDEPENDENT_AMBULATORY_CARE_PROVIDER_SITE_OTHER): Payer: Medicare Other | Admitting: Ophthalmology

## 2016-04-04 DIAGNOSIS — I10 Essential (primary) hypertension: Secondary | ICD-10-CM

## 2016-04-04 DIAGNOSIS — H34831 Tributary (branch) retinal vein occlusion, right eye, with macular edema: Secondary | ICD-10-CM

## 2016-04-04 DIAGNOSIS — H43813 Vitreous degeneration, bilateral: Secondary | ICD-10-CM

## 2016-04-04 DIAGNOSIS — H353122 Nonexudative age-related macular degeneration, left eye, intermediate dry stage: Secondary | ICD-10-CM | POA: Diagnosis not present

## 2016-04-04 DIAGNOSIS — D3131 Benign neoplasm of right choroid: Secondary | ICD-10-CM

## 2016-04-04 DIAGNOSIS — H35033 Hypertensive retinopathy, bilateral: Secondary | ICD-10-CM | POA: Diagnosis not present

## 2016-04-04 DIAGNOSIS — H2513 Age-related nuclear cataract, bilateral: Secondary | ICD-10-CM | POA: Diagnosis not present

## 2016-04-05 ENCOUNTER — Encounter (HOSPITAL_COMMUNITY): Payer: Self-pay | Admitting: Nurse Practitioner

## 2016-04-05 ENCOUNTER — Ambulatory Visit (HOSPITAL_COMMUNITY)
Admission: RE | Admit: 2016-04-05 | Discharge: 2016-04-05 | Disposition: A | Discharge status: Home / Self Care | Payer: Medicare Other | Source: Ambulatory Visit | Attending: Nurse Practitioner | Admitting: Nurse Practitioner

## 2016-04-05 VITALS — BP 130/64 | HR 83 | Ht 70.0 in | Wt 216.4 lb

## 2016-04-05 DIAGNOSIS — Z91018 Allergy to other foods: Secondary | ICD-10-CM | POA: Insufficient documentation

## 2016-04-05 DIAGNOSIS — Z96619 Presence of unspecified artificial shoulder joint: Secondary | ICD-10-CM | POA: Diagnosis not present

## 2016-04-05 DIAGNOSIS — I480 Paroxysmal atrial fibrillation: Secondary | ICD-10-CM

## 2016-04-05 DIAGNOSIS — I481 Persistent atrial fibrillation: Secondary | ICD-10-CM | POA: Insufficient documentation

## 2016-04-05 DIAGNOSIS — Z87891 Personal history of nicotine dependence: Secondary | ICD-10-CM | POA: Insufficient documentation

## 2016-04-05 DIAGNOSIS — I10 Essential (primary) hypertension: Secondary | ICD-10-CM | POA: Diagnosis not present

## 2016-04-05 DIAGNOSIS — I2510 Atherosclerotic heart disease of native coronary artery without angina pectoris: Secondary | ICD-10-CM | POA: Insufficient documentation

## 2016-04-05 DIAGNOSIS — M1990 Unspecified osteoarthritis, unspecified site: Secondary | ICD-10-CM | POA: Diagnosis not present

## 2016-04-05 DIAGNOSIS — I4891 Unspecified atrial fibrillation: Secondary | ICD-10-CM

## 2016-04-05 DIAGNOSIS — Z800 Family history of malignant neoplasm of digestive organs: Secondary | ICD-10-CM | POA: Diagnosis not present

## 2016-04-05 DIAGNOSIS — I471 Supraventricular tachycardia: Secondary | ICD-10-CM | POA: Insufficient documentation

## 2016-04-05 DIAGNOSIS — Z8673 Personal history of transient ischemic attack (TIA), and cerebral infarction without residual deficits: Secondary | ICD-10-CM | POA: Insufficient documentation

## 2016-04-05 DIAGNOSIS — Z888 Allergy status to other drugs, medicaments and biological substances status: Secondary | ICD-10-CM | POA: Diagnosis not present

## 2016-04-05 DIAGNOSIS — Z8249 Family history of ischemic heart disease and other diseases of the circulatory system: Secondary | ICD-10-CM | POA: Insufficient documentation

## 2016-04-05 DIAGNOSIS — K589 Irritable bowel syndrome without diarrhea: Secondary | ICD-10-CM | POA: Insufficient documentation

## 2016-04-05 DIAGNOSIS — Z955 Presence of coronary angioplasty implant and graft: Secondary | ICD-10-CM | POA: Diagnosis not present

## 2016-04-05 DIAGNOSIS — Z886 Allergy status to analgesic agent status: Secondary | ICD-10-CM | POA: Diagnosis not present

## 2016-04-05 DIAGNOSIS — Z887 Allergy status to serum and vaccine status: Secondary | ICD-10-CM | POA: Insufficient documentation

## 2016-04-05 DIAGNOSIS — Z96651 Presence of right artificial knee joint: Secondary | ICD-10-CM | POA: Insufficient documentation

## 2016-04-05 DIAGNOSIS — Z7901 Long term (current) use of anticoagulants: Secondary | ICD-10-CM | POA: Diagnosis not present

## 2016-04-05 LAB — BASIC METABOLIC PANEL
Anion gap: 8 (ref 5–15)
BUN: 9 mg/dL (ref 6–20)
CHLORIDE: 105 mmol/L (ref 101–111)
CO2: 28 mmol/L (ref 22–32)
CREATININE: 0.71 mg/dL (ref 0.61–1.24)
Calcium: 9 mg/dL (ref 8.9–10.3)
GFR calc non Af Amer: >60 mL/min (ref >60)
Glucose, Bld: 155 mg/dL — ABNORMAL HIGH (ref 65–99)
POTASSIUM: 3.9 mmol/L (ref 3.5–5.1)
SODIUM: 141 mmol/L (ref 135–145)

## 2016-04-05 LAB — MAGNESIUM: Magnesium: 1.8 mg/dL (ref 1.7–2.4)

## 2016-04-06 ENCOUNTER — Other Ambulatory Visit (HOSPITAL_COMMUNITY): Payer: Self-pay | Admitting: *Deleted

## 2016-04-06 DIAGNOSIS — I4819 Other persistent atrial fibrillation: Secondary | ICD-10-CM

## 2016-04-06 MED ORDER — MAGNESIUM OXIDE 400 (241.3 MG) MG PO TABS: 400.0000 mg | ORAL_TABLET | Freq: Two times a day (BID) | ORAL | 3 refills | Status: DC

## 2016-04-06 NOTE — Progress Notes (Signed)
Primary Care Physician: [name], MD Referring Physician:   KOWEN KLUTH is a 76 y.o. male with a h/o afib that was recently hospitalized for Tikosyn initiation at [facility], 1/8-1/11.He is maintaining SR. He understands importance of regular use of tikosyn and precautions in its use. CHA2DS2VASc is 5  and continues on xarelto without missed doses.   Today, he denies symptoms of palpitations, chest pain, shortness of breath, orthopnea, PND, lower extremity edema, dizziness, presyncope, syncope, or neurologic sequela. The patient is tolerating medications without difficulties and is otherwise without complaint today.   Past Medical History:  Diagnosis Date  . Arthritis   . Atrial fibrillation, persistent ([place])   . Chronic sinusitis   . Coronary artery disease   . Depression   . Difficult intubation    was told with shoulder done 2006-alittle narrow  . 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 Hypertension   . Jejunostomy tube fell out   . 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 Spastic colon   . Stroke (cerebrum) ([place])   . SVT (supraventricular tachycardia) ([facility])    Past Surgical History:  Procedure Laterality Date  . ESOPHAGEAL DILATION  2017  . FOOT NEUROMA SURGERY  2002  . LEFT HEART CATHETERIZATION WITH CORONARY ANGIOGRAM Right 06/14/2011   20% LM, chronic occluded mid LAD, 50% ostial LCX, 20% mid RI, RCA with collaterals to mid LAD, mid 10% stenosis, EF 60% 06/14/11  . NASAL SINUS SURGERY    . SHOULDER ARTHROSCOPY  03/01/2011   Procedure: ARTHROSCOPY SHOULDER;  Surgeon: Ninetta Lights, MD;  Location: [place];  Service: Orthopedics;  Laterality: Right;  arthroscopy shoulder decompression subacromial partial acromioplasty with coracoacromial release, distal claviculectomy, debridement of labrium  . SHOULDER ARTHROSCOPY  03/01/2011   Procedure: ARTHROSCOPY SHOULDER;  Surgeon: Ninetta Lights, MD;  Location: [facility];  Service:  Orthopedics;  Laterality: Right;  arthroscopy shoulder decompression subacromial partial acromioplasty with coracoacromial release, distal claviculectomy, debridement of labrium  . TEE WITHOUT CARDIOVERSION N/A 03/19/2016   Procedure: TRANSESOPHAGEAL ECHOCARDIOGRAM (TEE);  Surgeon: Larey Dresser, MD;  Location: [facility];  Service: Cardiovascular;  Laterality: N/A;  . TOTAL KNEE ARTHROPLASTY Right 09/23/2013   Procedure: TOTAL KNEE ARTHROPLASTY;  Surgeon: Ninetta Lights, MD;  Location: [place];  Service: Orthopedics;  Laterality: Right;  . TOTAL SHOULDER ARTHROPLASTY  06/28/2011   Procedure: TOTAL SHOULDER ARTHROPLASTY;  Surgeon: Ninetta Lights, MD;  Location: [facility];  Service: Orthopedics;  Laterality: Right;  . UVULOPALATOPHARYNGOPLASTY      Current Outpatient Prescriptions  Medication Sig Dispense Refill  . acetaminophen (TYLENOL) 325 MG tablet Take 650 mg by mouth every 6 (six) hours as needed for moderate pain.    Marland Kitchen amLODipine (NORVASC) 5 MG tablet Take 5 mg by mouth daily.     . Cholecalciferol (VITAMIN D3) 5000 units TABS Take 5,000 Units by mouth daily.    . Cyanocobalamin (VITAMIN B-12) 500 MCG SUBL Place 500 mcg under the tongue 3 (three) times a week.     . dofetilide (TIKOSYN) 250 MCG capsule Take 1 capsule (250 mcg total) by mouth 2 (two) times daily. 60 capsule 3  . magnesium oxide (MAG-OX) 400 (241.3 Mg) MG tablet Take 1 tablet (400 mg total) by mouth daily. 30 tablet 3  . Melatonin 10 MG TABS Take 10 mg by mouth at bedtime.     . metoprolol tartrate (LOPRESSOR) 25 MG tablet Take 25 mg by mouth  2 (two) times daily.    . Multiple Vitamins-Minerals (PRESERVISION AREDS 2 PO) Take 1 capsule by mouth 2 (two) times daily.    Marland Kitchen omeprazole (PRILOSEC) 40 MG capsule Take 40 mg by mouth every morning.     Marland Kitchen PARoxetine (PAXIL) 20 MG tablet Take 20 mg by mouth every evening.      . polycarbophil (FIBERCON) 625 MG tablet Take 625 mg by mouth daily.      [name]  Glycol-Propyl Glycol (SYSTANE OP) Place 2 drops into both eyes 2 (two) times daily.    . rosuvastatin (CRESTOR) 5 MG tablet Take 5 mg by mouth every morning.     . valsartan (DIOVAN) 160 MG tablet Take 1 tablet (160 mg total) by mouth daily. 30 tablet 6  . XARELTO 20 MG TABS tablet Take 1 tablet (20 mg total) by mouth daily after supper. 90 tablet 3   No current facility-administered medications for this encounter.     Allergies  Allergen Reactions  . Tetanus Toxoid Anaphylaxis, Hives and Other (See Comments)    REACTION: hives/throat swells shut  . Fish Oil Other (See Comments)    Stomach cramps, can't eat   . Glucosamine-Chondroitin Other (See Comments)    Stomach cramps, can't eat  . Ibuprofen Nausea Only and Other (See Comments)    Stomach pains  . Naproxen Diarrhea    Social History   Social History  . Marital status: Married    Spouse name: N/A  . Number of children: 3  . Years of education: N/A   Occupational History  . retired    Social History Main Topics  . Smoking status: Former Smoker    Packs/day: 2.00    Years: 30.00    Types: Cigarettes    Quit date: 02/27/1998  . Smokeless tobacco: Never Used  . Alcohol use 8.4 oz/week    14 Standard drinks or equivalent per week     Comment: daily  . Drug use: No  . Sexual activity: Not on file   Other Topics Concern  . Not on file   Social History Narrative   Patient is left handed.   Patient drinks one cup caffeine daily.    Family History  Problem Relation Age of Onset  . Cancer Mother     colon  . Heart attack Father   . Migraines Brother     ROS- All systems are reviewed and negative except as per the HPI above  Physical Exam: Vitals:   04/05/16 1503  BP: 130/64  Pulse: 83  Weight: 216 lb 6.4 oz (98.2 kg)  Height: '5\' 10"'$  (1.778 m)   Wt Readings from Last 3 Encounters:  04/05/16 216 lb 6.4 oz (98.2 kg)  03/19/16 214 lb (97.1 kg)  03/15/16 214 lb 12.8 oz (97.4 kg)    Labs: Lab Results    Component Value Date   NA 141 04/05/2016   K 3.9 04/05/2016   CL 105 04/05/2016   CO2 28 04/05/2016   GLUCOSE 155 (H) 04/05/2016   BUN 9 04/05/2016   CREATININE 0.71 04/05/2016   CALCIUM 9.0 04/05/2016   MG 1.8 04/05/2016   Lab Results  Component Value Date   INR 0.96 09/15/2013   No results found for: CHOL, HDL, LDLCALC, TRIG   GEN- The patient is well appearing, alert and oriented x 3 today.   Head- normocephalic, atraumatic Eyes-  Sclera clear, conjunctiva pink Ears- hearing intact Oropharynx- clear Neck- supple, no JVP Lymph- no cervical lymphadenopathy  Lungs- Clear to ausculation bilaterally, normal work of breathing Heart- Regular rate and rhythm, no murmurs, rubs or gallops, PMI not laterally displaced GI- soft, NT, ND, + BS Extremities- no clubbing, cyanosis, or edema MS- no significant deformity or atrophy Skin- no rash or lesion Psych- euthymic mood, full affect Neuro- strength and sensation are intact  EKG-NSR at 83 bpm, pr int 138 ms, qrs int 82 ms, qtc 477 ms Epic records reviewed    Assessment and Plan: 1. Afib  Maintaining SR with recent tikosyn loading Continue Tikosyn at 500 mg bid Precautions of use discussed again with pt Continue xarleto without missed doses Bmet/mag  Today  F/u with Dr. Lovena Le as scheduled 04/25/16 afib clinic as needed  Butch Penny C. Lache Dagher, [facility] [address] [place], [place] [address] [phone]

## 2016-04-20 ENCOUNTER — Encounter: Payer: Self-pay | Admitting: Internal Medicine

## 2016-04-25 ENCOUNTER — Encounter: Payer: Self-pay | Admitting: Internal Medicine

## 2016-04-25 ENCOUNTER — Ambulatory Visit: Payer: Medicare Other | Admitting: Internal Medicine

## 2016-04-25 ENCOUNTER — Ambulatory Visit (INDEPENDENT_AMBULATORY_CARE_PROVIDER_SITE_OTHER): Payer: Medicare Other | Admitting: Internal Medicine

## 2016-04-25 VITALS — BP 140/84 | HR 80 | Ht 70.0 in | Wt 213.6 lb

## 2016-04-25 DIAGNOSIS — I481 Persistent atrial fibrillation: Secondary | ICD-10-CM | POA: Diagnosis not present

## 2016-04-25 DIAGNOSIS — I471 Supraventricular tachycardia: Secondary | ICD-10-CM | POA: Diagnosis not present

## 2016-04-25 DIAGNOSIS — I4819 Other persistent atrial fibrillation: Secondary | ICD-10-CM

## 2016-04-25 MED ORDER — DOFETILIDE 250 MCG PO CAPS: 250.0000 ug | ORAL_CAPSULE | Freq: Two times a day (BID) | ORAL | 3 refills | Status: DC

## 2016-04-25 MED ORDER — MAGNESIUM OXIDE 400 (241.3 MG) MG PO TABS: 400.0000 mg | ORAL_TABLET | Freq: Two times a day (BID) | ORAL | 3 refills | Status: DC

## 2016-04-25 NOTE — Progress Notes (Signed)
HPI [name] returns for ongoing evaluation of SVT and now PAF. He is a pleasant 76 yo man with a h/o CAD, HTN and prior cryptogenic stroke. He presented to the hospital just over 6 weeks ago with SVT after presenting for an EGD and found to be in SVT. He was given IV adenosine and metoprolol without relief. He was ultimately given sotalol.. He had variable levels of AV block confirming a diagnosis of atrial tachycardia. He was in Dr. Irven Shelling office a couple of days ago and was in atrial tachy and was minimally symptomatic (sob). He was given a heart monitor which demonstrated atrial tachy but also atrial fibrillation. He was admitted and placed on Dofetilide. He returns today for followup. He notes rare episodes where he will feel sob for a few seconds.  Allergies  Allergen Reactions  . Tetanus Toxoid Anaphylaxis, Hives and Other (See Comments)    REACTION: hives/throat swells shut  . Fish Oil Other (See Comments)    Stomach cramps, can't eat   . Glucosamine-Chondroitin Other (See Comments)    Stomach cramps, can't eat  . Ibuprofen Nausea Only and Other (See Comments)    Stomach pains  . Naproxen Diarrhea     Current Outpatient Prescriptions  Medication Sig Dispense Refill  . acetaminophen (TYLENOL) 325 MG tablet Take 650 mg by mouth every 6 (six) hours as needed for moderate pain.    Marland Kitchen amLODipine (NORVASC) 5 MG tablet Take 5 mg by mouth daily.     . Cholecalciferol (VITAMIN D3) 5000 units TABS Take 5,000 Units by mouth daily.    . Cyanocobalamin (VITAMIN B-12) 500 MCG SUBL Place 500 mcg under the tongue 3 (three) times a week.     . dofetilide (TIKOSYN) 250 MCG capsule Take 1 capsule (250 mcg total) by mouth 2 (two) times daily. 60 capsule 3  . magnesium oxide (MAG-OX) 400 (241.3 Mg) MG tablet Take 1 tablet (400 mg total) by mouth 2 (two) times daily. 30 tablet 3  . Melatonin 10 MG TABS Take 10 mg by mouth at bedtime.     . metoprolol tartrate (LOPRESSOR) 25 MG tablet Take 25  mg by mouth 2 (two) times daily.    . Multiple Vitamins-Minerals (PRESERVISION AREDS 2 PO) Take 1 capsule by mouth 2 (two) times daily.    Marland Kitchen omeprazole (PRILOSEC) 40 MG capsule Take 40 mg by mouth every morning.     Marland Kitchen PARoxetine (PAXIL) 20 MG tablet Take 20 mg by mouth every evening.      . polycarbophil (FIBERCON) 625 MG tablet Take 625 mg by mouth daily.      [name] Glycol-Propyl Glycol (SYSTANE OP) Place 2 drops into both eyes 2 (two) times daily.    . rosuvastatin (CRESTOR) 5 MG tablet Take 5 mg by mouth every morning.     . valsartan (DIOVAN) 160 MG tablet Take 1 tablet (160 mg total) by mouth daily. 30 tablet 6  . XARELTO 20 MG TABS tablet Take 1 tablet (20 mg total) by mouth daily after supper. 90 tablet 3   No current facility-administered medications for this visit.      Past Medical History:  Diagnosis Date  . Arthritis   . Atrial fibrillation, persistent ([place])   . Chronic sinusitis   . Coronary artery disease   . Depression   . Difficult intubation    was told with shoulder done 2006-alittle narrow  . 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 Hypertension   .  Jejunostomy tube fell out   . 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 Spastic colon   . Stroke (cerebrum) ([place])   . SVT (supraventricular tachycardia) ([facility])     ROS:   All systems reviewed and negative except as noted in the HPI.   Past Surgical History:  Procedure Laterality Date  . ESOPHAGEAL DILATION  2017  . FOOT NEUROMA SURGERY  2002  . LEFT HEART CATHETERIZATION WITH CORONARY ANGIOGRAM Right 06/14/2011   20% LM, chronic occluded mid LAD, 50% ostial LCX, 20% mid RI, RCA with collaterals to mid LAD, mid 10% stenosis, EF 60% 06/14/11  . NASAL SINUS SURGERY    . SHOULDER ARTHROSCOPY  03/01/2011   Procedure: ARTHROSCOPY SHOULDER;  Surgeon: Ninetta Lights, MD;  Location: [facility];  Service: Orthopedics;  Laterality: Right;  arthroscopy shoulder decompression subacromial partial  acromioplasty with coracoacromial release, distal claviculectomy, debridement of labrium  . SHOULDER ARTHROSCOPY  03/01/2011   Procedure: ARTHROSCOPY SHOULDER;  Surgeon: Ninetta Lights, MD;  Location: [place] [facility];  Service: Orthopedics;  Laterality: Right;  arthroscopy shoulder decompression subacromial partial acromioplasty with coracoacromial release, distal claviculectomy, debridement of labrium  . TEE WITHOUT CARDIOVERSION N/A 03/19/2016   Procedure: TRANSESOPHAGEAL ECHOCARDIOGRAM (TEE);  Surgeon: Larey Dresser, MD;  Location: [facility];  Service: Cardiovascular;  Laterality: N/A;  . TOTAL KNEE ARTHROPLASTY Right 09/23/2013   Procedure: TOTAL KNEE ARTHROPLASTY;  Surgeon: Ninetta Lights, MD;  Location: [facility];  Service: Orthopedics;  Laterality: Right;  . TOTAL SHOULDER ARTHROPLASTY  06/28/2011   Procedure: TOTAL SHOULDER ARTHROPLASTY;  Surgeon: Ninetta Lights, MD;  Location: [facility];  Service: Orthopedics;  Laterality: Right;  . UVULOPALATOPHARYNGOPLASTY       Family History  Problem Relation Age of Onset  . Cancer Mother     colon  . Heart attack Father   . Migraines Brother      Social History   Social History  . Marital status: Married    Spouse name: N/A  . Number of children: 3  . Years of education: N/A   Occupational History  . retired    Social History Main Topics  . Smoking status: Former Smoker    Packs/day: 2.00    Years: 30.00    Types: Cigarettes    Quit date: 02/27/1998  . Smokeless tobacco: Never Used  . Alcohol use 8.4 oz/week    14 Standard drinks or equivalent per week     Comment: daily  . Drug use: No  . Sexual activity: Not on file   Other Topics Concern  . Not on file   Social History Narrative   Patient is left handed.   Patient drinks one cup caffeine daily.     BP 140/84   Pulse 80   Ht '5\' 10"'$  (1.778 m)   Wt 213 lb 9.6 oz (96.9 kg)   SpO2 99%   BMI 30.65 kg/m   Physical Exam:  Well  appearing 76 yo man, NAD HEENT: Unremarkable Neck:  6 cm JVD, no thyromegally Lymphatics:  No adenopathy Back:  No CVA tenderness Lungs:  Clear with no wheezes HEART:  Regular rate rhythm, no murmurs, no rubs, no clicks Abd:  soft, positive bowel sounds, no organomegally, no rebound, no guarding Ext:  2 plus pulses, no edema, no cyanosis, no clubbing Skin:  No rashes no nodules Neuro:  CN II through XII intact, motor grossly intact  ECG - NSR  with minimally prolonged QTC.  Assess/Plan: 1. Atrial tachycardia - he Has paroxysmal atrial tachycardia as well as other atrial arrhythmias. He will continue a strategy of rate control for now, as his rhythm control options are limited. He has done well so far with dofetilide. 2. CAD - he denies anginal symptoms. He has a known occluded LAD with collaterals. 3. Stroke - he is asymptomatic. At this point it is highly likely that he is having atrial fib as the cause and he will be on lifelong anti-coagulation. He will continue xarelto. 4. Atrial fib - as above. He is asymptomatic or minimally symptomatic. Continue dofetilide.  Mikle Bosworth.D.

## 2016-04-25 NOTE — Patient Instructions (Addendum)
Medication Instructions:  Your physician recommends that you continue on your current medications as directed. Please refer to the Current Medication list given to you today.   Labwork: None Ordered   Testing/Procedures: None Ordered   Follow-Up: Your physician wants you to follow-up in: 6 months with Dr. Taylor. You will receive a reminder letter in the mail two months in advance. If you don't receive a letter, please call our office to schedule the follow-up appointment.    Any Other Special Instructions Will Be Listed Below (If Applicable).     If you need a refill on your cardiac medications before your next appointment, please call your pharmacy.   

## 2016-04-26 ENCOUNTER — Telehealth: Payer: Self-pay

## 2016-04-26 NOTE — Telephone Encounter (Signed)
Sent clearance documents to medical records to be faxed to [facility] at [phone]. Dr. Lovena Le stated "Hold Xarelto 2 days before surgery. Low risk for colonoscopy".

## 2016-04-27 ENCOUNTER — Telehealth: Payer: Self-pay | Admitting: Internal Medicine

## 2016-04-27 NOTE — Telephone Encounter (Signed)
Called, spoke with pt. Pt stated [facility] received clearance yesterday.

## 2016-04-27 NOTE — Telephone Encounter (Signed)
Pt called in regards to his coming off  [place].  DR Medoff office faxed request  2/6   Fax [phone]  pho [phone]

## 2016-04-30 ENCOUNTER — Telehealth: Payer: Self-pay

## 2016-04-30 NOTE — Telephone Encounter (Signed)
Left voicemail that clearance documents were faxed on 04/26/16. Informed to please call our office if not received, we will refax them.

## 2016-05-02 ENCOUNTER — Encounter (INDEPENDENT_AMBULATORY_CARE_PROVIDER_SITE_OTHER): Payer: Medicare Other | Admitting: Ophthalmology

## 2016-05-16 ENCOUNTER — Telehealth: Payer: Self-pay | Admitting: Internal Medicine

## 2016-05-16 ENCOUNTER — Encounter (INDEPENDENT_AMBULATORY_CARE_PROVIDER_SITE_OTHER): Payer: Medicare Other | Admitting: Ophthalmology

## 2016-05-16 DIAGNOSIS — I10 Essential (primary) hypertension: Secondary | ICD-10-CM | POA: Diagnosis not present

## 2016-05-16 DIAGNOSIS — H35033 Hypertensive retinopathy, bilateral: Secondary | ICD-10-CM

## 2016-05-16 DIAGNOSIS — H353211 Exudative age-related macular degeneration, right eye, with active choroidal neovascularization: Secondary | ICD-10-CM | POA: Diagnosis not present

## 2016-05-16 DIAGNOSIS — H353122 Nonexudative age-related macular degeneration, left eye, intermediate dry stage: Secondary | ICD-10-CM | POA: Diagnosis not present

## 2016-05-16 DIAGNOSIS — H43813 Vitreous degeneration, bilateral: Secondary | ICD-10-CM | POA: Diagnosis not present

## 2016-05-16 DIAGNOSIS — D3131 Benign neoplasm of right choroid: Secondary | ICD-10-CM | POA: Diagnosis not present

## 2016-05-16 DIAGNOSIS — H2511 Age-related nuclear cataract, right eye: Secondary | ICD-10-CM | POA: Diagnosis not present

## 2016-05-16 NOTE — Telephone Encounter (Signed)
°*  STAT* If patient is at the pharmacy, call can be transferred to refill team.   1. Which medications need to be refilled? (please list name of each medication and dose if known) Amlodipine '5mg'$   2. Which pharmacy/location (including street and city if local pharmacy) is medication to be sent to?[facility] at [address]    3. Do they need a 30 day or 90 day supply? 90  Please call once it has been sent for refill . Thanks

## 2016-05-16 NOTE — Telephone Encounter (Signed)
I do not see that Dr Lovena Le has ever filled this for the patient. Please advise. Thanks, MI

## 2016-05-17 ENCOUNTER — Other Ambulatory Visit: Payer: Self-pay | Admitting: *Deleted

## 2016-05-17 MED ORDER — AMLODIPINE BESYLATE 5 MG PO TABS: 5.0000 mg | ORAL_TABLET | Freq: Every day | ORAL | 1 refills | Status: DC

## 2016-06-20 ENCOUNTER — Encounter (INDEPENDENT_AMBULATORY_CARE_PROVIDER_SITE_OTHER): Payer: Medicare Other | Admitting: Ophthalmology

## 2016-06-20 DIAGNOSIS — D3131 Benign neoplasm of right choroid: Secondary | ICD-10-CM | POA: Diagnosis not present

## 2016-06-20 DIAGNOSIS — H318 Other specified disorders of choroid: Secondary | ICD-10-CM

## 2016-06-20 DIAGNOSIS — I10 Essential (primary) hypertension: Secondary | ICD-10-CM

## 2016-06-20 DIAGNOSIS — H35033 Hypertensive retinopathy, bilateral: Secondary | ICD-10-CM

## 2016-06-20 DIAGNOSIS — H43813 Vitreous degeneration, bilateral: Secondary | ICD-10-CM

## 2016-06-20 DIAGNOSIS — H353122 Nonexudative age-related macular degeneration, left eye, intermediate dry stage: Secondary | ICD-10-CM | POA: Diagnosis not present

## 2016-06-21 ENCOUNTER — Other Ambulatory Visit (HOSPITAL_COMMUNITY): Payer: Self-pay | Admitting: Interventional Radiology

## 2016-06-21 DIAGNOSIS — I771 Stricture of artery: Secondary | ICD-10-CM

## 2016-07-17 ENCOUNTER — Ambulatory Visit (HOSPITAL_COMMUNITY)
Admission: RE | Admit: 2016-07-17 | Discharge: 2016-07-17 | Disposition: A | Discharge status: Home / Self Care | Payer: Medicare Other | Source: Ambulatory Visit | Attending: Interventional Radiology | Admitting: Interventional Radiology

## 2016-07-17 DIAGNOSIS — I771 Stricture of artery: Secondary | ICD-10-CM | POA: Diagnosis present

## 2016-07-17 DIAGNOSIS — I6521 Occlusion and stenosis of right carotid artery: Secondary | ICD-10-CM | POA: Insufficient documentation

## 2016-07-17 NOTE — Progress Notes (Signed)
*  PRELIMINARY RESULTS* Vascular Ultrasound Carotid Duplex (Doppler) has been completed.  Preliminary findings: Bilateral 1-39% ICA stenosis, mild/moderate echogenic plaque. antegrade vertebral flow.   Everrett Coombe 07/17/2016, 4:19 PM

## 2016-07-18 ENCOUNTER — Other Ambulatory Visit: Payer: Self-pay | Admitting: Physician Assistant

## 2016-07-18 LAB — VAS US CAROTID
LCCADDIAS: 14 cm/s
LEFT ECA DIAS: -18 cm/s
LEFT VERTEBRAL DIAS: 12 cm/s
LICADDIAS: -17 cm/s
LICADSYS: -49 cm/s
LICAPDIAS: 18 cm/s
LICAPSYS: 55 cm/s
Left CCA dist sys: 47 cm/s
Left CCA prox dias: 11 cm/s
Left CCA prox sys: 74 cm/s
RIGHT ECA DIAS: -17 cm/s
RIGHT VERTEBRAL DIAS: 19 cm/s
Right CCA prox dias: -14 cm/s
Right CCA prox sys: -86 cm/s
Right cca dist sys: -69 cm/s

## 2016-07-18 NOTE — Telephone Encounter (Signed)
magnesium oxide (MAG-OX) 400 (241.3 Mg) MG tablet  Medication  Date: 04/25/2016 Department: [address] Office Ordering/Authorizing: Evans Lance, MD  Order Providers   Prescribing Provider Encounter Provider  Evans Lance, MD Evans Lance, MD  Medication Detail    Disp Refills Start End   magnesium oxide (MAG-OX) 400 (241.3 Mg) MG tablet 180 tablet 3 04/25/2016    Sig - Route: Take 1 tablet (400 mg total) by mouth 2 (two) times daily. - Oral   E-Prescribing Status: Receipt confirmed by pharmacy (04/25/2016 9:52 AM EST)   Pharmacy   [place], [place]

## 2016-07-23 ENCOUNTER — Telehealth (HOSPITAL_COMMUNITY): Payer: Self-pay

## 2016-07-23 NOTE — Telephone Encounter (Signed)
Pt agreed to f/u in 6 months with us carotid. AW 

## 2016-07-25 ENCOUNTER — Encounter (INDEPENDENT_AMBULATORY_CARE_PROVIDER_SITE_OTHER): Payer: Medicare Other | Admitting: Ophthalmology

## 2016-07-26 ENCOUNTER — Other Ambulatory Visit: Payer: Self-pay | Admitting: Internal Medicine

## 2016-07-26 MED ORDER — METOPROLOL TARTRATE 25 MG PO TABS
25.0000 mg | ORAL_TABLET | Freq: Two times a day (BID) | ORAL | 2 refills | Status: DC
Start: 2016-07-26 — End: 2017-02-14

## 2016-07-26 MED ORDER — VALSARTAN 160 MG PO TABS: 160.0000 mg | ORAL_TABLET | Freq: Every day | ORAL | 2 refills | Status: DC

## 2016-07-26 MED ORDER — AMLODIPINE BESYLATE 5 MG PO TABS: 5.0000 mg | ORAL_TABLET | Freq: Every day | ORAL | 2 refills | Status: DC

## 2016-07-30 ENCOUNTER — Encounter (INDEPENDENT_AMBULATORY_CARE_PROVIDER_SITE_OTHER): Payer: Medicare Other | Admitting: Ophthalmology

## 2016-07-30 DIAGNOSIS — D3131 Benign neoplasm of right choroid: Secondary | ICD-10-CM

## 2016-07-30 DIAGNOSIS — I10 Essential (primary) hypertension: Secondary | ICD-10-CM | POA: Diagnosis not present

## 2016-07-30 DIAGNOSIS — H43813 Vitreous degeneration, bilateral: Secondary | ICD-10-CM

## 2016-07-30 DIAGNOSIS — H35033 Hypertensive retinopathy, bilateral: Secondary | ICD-10-CM

## 2016-07-30 DIAGNOSIS — H353122 Nonexudative age-related macular degeneration, left eye, intermediate dry stage: Secondary | ICD-10-CM | POA: Diagnosis not present

## 2016-07-30 DIAGNOSIS — H318 Other specified disorders of choroid: Secondary | ICD-10-CM

## 2016-08-27 ENCOUNTER — Encounter (INDEPENDENT_AMBULATORY_CARE_PROVIDER_SITE_OTHER): Payer: Medicare Other | Admitting: Ophthalmology

## 2016-08-27 DIAGNOSIS — H43813 Vitreous degeneration, bilateral: Secondary | ICD-10-CM

## 2016-08-27 DIAGNOSIS — D3131 Benign neoplasm of right choroid: Secondary | ICD-10-CM | POA: Diagnosis not present

## 2016-08-27 DIAGNOSIS — I10 Essential (primary) hypertension: Secondary | ICD-10-CM

## 2016-08-27 DIAGNOSIS — H318 Other specified disorders of choroid: Secondary | ICD-10-CM

## 2016-08-27 DIAGNOSIS — H35033 Hypertensive retinopathy, bilateral: Secondary | ICD-10-CM

## 2016-08-27 DIAGNOSIS — H353122 Nonexudative age-related macular degeneration, left eye, intermediate dry stage: Secondary | ICD-10-CM | POA: Diagnosis not present

## 2016-09-26 ENCOUNTER — Other Ambulatory Visit: Payer: Self-pay | Admitting: Physician Assistant

## 2016-09-26 DIAGNOSIS — F17210 Nicotine dependence, cigarettes, uncomplicated: Secondary | ICD-10-CM

## 2016-09-28 ENCOUNTER — Telehealth: Payer: Self-pay | Admitting: Internal Medicine

## 2016-09-28 MED ORDER — IRBESARTAN 150 MG PO TABS: 150.0000 mg | ORAL_TABLET | Freq: Every day | ORAL | 0 refills | Status: DC

## 2016-09-28 NOTE — Telephone Encounter (Signed)
Pt calling concerning his medication Valsartan. Pt would like for medication to be change because of the recall. Pt would like someone to call him back. Please address

## 2016-09-28 NOTE — Telephone Encounter (Signed)
Spoke with pt about recall. Questions answered. Pt will change to irbesartan 150mg  daily. He states understanding and appreciation and will call with changes in pressures or issues.

## 2016-10-03 ENCOUNTER — Ambulatory Visit
Admission: RE | Admit: 2016-10-03 | Discharge: 2016-10-03 | Disposition: A | Discharge status: Home / Self Care | Payer: Medicare Other | Source: Ambulatory Visit | Attending: Physician Assistant | Admitting: Physician Assistant

## 2016-10-03 DIAGNOSIS — F17210 Nicotine dependence, cigarettes, uncomplicated: Secondary | ICD-10-CM

## 2016-10-05 ENCOUNTER — Telehealth: Payer: Self-pay | Admitting: Internal Medicine

## 2016-10-05 ENCOUNTER — Encounter (INDEPENDENT_AMBULATORY_CARE_PROVIDER_SITE_OTHER): Payer: Medicare Other | Admitting: Ophthalmology

## 2016-10-05 DIAGNOSIS — H353122 Nonexudative age-related macular degeneration, left eye, intermediate dry stage: Secondary | ICD-10-CM | POA: Diagnosis not present

## 2016-10-05 DIAGNOSIS — I10 Essential (primary) hypertension: Secondary | ICD-10-CM

## 2016-10-05 DIAGNOSIS — H43813 Vitreous degeneration, bilateral: Secondary | ICD-10-CM

## 2016-10-05 DIAGNOSIS — H35033 Hypertensive retinopathy, bilateral: Secondary | ICD-10-CM | POA: Diagnosis not present

## 2016-10-05 DIAGNOSIS — D3131 Benign neoplasm of right choroid: Secondary | ICD-10-CM

## 2016-10-05 DIAGNOSIS — H318 Other specified disorders of choroid: Secondary | ICD-10-CM

## 2016-10-05 NOTE — Telephone Encounter (Signed)
Lm with pt's grandson for Mardene Celeste to return our call.

## 2016-10-09 ENCOUNTER — Ambulatory Visit (INDEPENDENT_AMBULATORY_CARE_PROVIDER_SITE_OTHER): Payer: Medicare Other | Admitting: Internal Medicine

## 2016-10-09 ENCOUNTER — Encounter: Payer: Self-pay | Admitting: Internal Medicine

## 2016-10-09 VITALS — BP 130/84 | HR 130 | Ht 70.0 in | Wt 221.6 lb

## 2016-10-09 DIAGNOSIS — I471 Supraventricular tachycardia: Secondary | ICD-10-CM

## 2016-10-09 DIAGNOSIS — R918 Other nonspecific abnormal finding of lung field: Secondary | ICD-10-CM | POA: Diagnosis not present

## 2016-10-09 DIAGNOSIS — R05 Cough: Secondary | ICD-10-CM | POA: Diagnosis not present

## 2016-10-09 DIAGNOSIS — R058 Other specified cough: Secondary | ICD-10-CM

## 2016-10-09 NOTE — Assessment & Plan Note (Signed)
Poor control, advised should always take his BB before leaving home for the day > f/u cards planned

## 2016-10-09 NOTE — Assessment & Plan Note (Signed)
Allergy profile 01/20/2016 >  Eos 0.6 /  IgE  319 with RAST Mold > dog ? Low grade ABPA ? MR Brain 10/217 Mild moderate mucosal thickening right maxillary sinus with air-fluid level suggesting acute sinusitis.   Clearly multiple sources for uacs but not interested in rx options nor referral to ent or allergy at this point > Follow up per Primary Care planned  > no pulmonary f/u needed

## 2016-10-09 NOTE — Assessment & Plan Note (Signed)
He is low risk but not no risk and does not want to have another study for a year but reviewed with him the [employer] guidlelines    Discussed in detail all the  indications, usual  risks and alternatives  relative to the benefits with patient who agrees to proceed with CT at a maximum of 6 months > placed in reminder file  Notified that any of these multiple nodules could grow in meantime and that any symptoms esp pleuritic cp or unexplained cough should be addressed here   I had an extended discussion with the patient/wife  reviewing all relevant studies completed to date and  lasting 84minutes of a 12minute visit  Re consultation for a new problem and review of previous issues  Each maintenance medication was reviewed in detail including most importantly the difference between maintenance and prns and under what circumstances the prns are to be triggered using an action plan format that is not reflected in the computer generated alphabetically organized AVS.    Please see AVS for specific instructions unique to this visit that I personally wrote and verbalized to the the pt in detail and then reviewed with pt  by my nurse highlighting any  changes in therapy recommended at today's visit to their plan of care.

## 2016-10-09 NOTE — Patient Instructions (Addendum)
We will contact you in 6 months for follow up CT chest - call sooner if new chest pain or unexplained cough

## 2016-10-09 NOTE — Progress Notes (Signed)
Subjective:    Patient ID: George Vega, male    DOB: [birth year],    MRN: [id number]  Brief patient profile:   75 yowm quit smoking 1999 slowed down by arthritis but new waking from sleeping with sob x around 11/2015 referred to pulmonary clinic 01/20/2016 by Dr  Lovena Le with abn pfts 12/16/15      History of Present Illness  01/20/2016 1st [facility] office visit/ [name]   Chief Complaint  Patient presents with  . Pulmonary Consult    Referred by Dr. Lovena Le. Pt c/o SOB off and on for the past 2 months. He states it mainly bothers him when he lies down. He occ gets SOB after he exerts himself.    wakes up sob x 2 months once a week x 20 sec, couple dep breaths and feels better.  Also wife hears noisy breathing at rest during same time period/ pt has sense of pnds and freq clearing his throat daytime only  He's more concerned with fatigue limiting activity tol than sob  Note has h/o acei cough remotely, resolved on arb rec Omeprazole 40   Take  30-60 min before first meal of the day and Pepcid (famotidine)  20 mg one @  bedtime until return to office -did not do GERD  Diet ? followed Please remember to go to the lab Allergy profile 01/20/2016 >  Eos 0.6 /  IgE  319 with RAST Mold > dog ? Low grade ABPA ? > rec f/u to discuss options but did not return and not interested in allergy eval or rec to keep dogs out of bedroom   10/09/2016  New problem = ov/[name] re: MPNS Chief Complaint  Patient presents with  . Pulmonary Consult    Referred by Fulton Mole, PA for eval of abnormal low dose CT Chest- done 10/03/16. Pt denies any respiratory co's today.   pt did not actually qualify for low dose screening Ct as > 15 years since quit but the low dose scan showed multiple bilateral nodules so referred to pulmonary clinic 10/09/2016 by Roe Coombs  Not limited by breathing from desired activities   Still having nasal congestion and urge to clear throat and never took gerd trial  "it's just not that bad"   No obvious day to day or daytime variability or assoc excess/ purulent sputum or mucus plugs or hemoptysis or cp or chest tightness, subjective wheeze or overt sinus or hb symptoms. No unusual exp hx or h/o childhood pna/ asthma or knowledge of premature birth.  Sleeping ok without nocturnal  or early am exacerbation  of respiratory  c/o's or need for noct saba. Also denies any obvious fluctuation of symptoms with weather or environmental changes or other aggravating or alleviating factors except as outlined above   Current Medications, Allergies, Complete Past Medical History, Past Surgical History, Family History, and Social History were reviewed in [employer] record.  ROS  The following are not active complaints unless bolded sore throat, dysphagia, dental problems, itching, sneezing,  nasal congestion or excess/ purulent secretions, ear ache,   fever, chills, sweats, unintended wt loss, classically pleuritic or exertional cp,  orthopnea pnd or leg swelling, presyncope, palpitations, abdominal pain, anorexia, nausea, vomiting, diarrhea  or change in bowel or bladder habits, change in stools or urine, dysuria,hematuria,  rash, arthralgias, visual complaints, headache, numbness, weakness or ataxia or problems with walking or coordination,  change in mood/affect or memory.  Objective:   Physical Exam  amb stoic wm nad nasal tone to voice and freq throat clearing      10/09/2016      222   01/20/16 218 lb 9.6 oz (99.2 kg)  12/14/15 212 lb (96.2 kg)  11/17/15 211 lb 4.8 oz (95.8 kg)    Vital signs reviewed - - Note on arrival 02 sats  95% on RA - note pulse 130 irreg on arrival prior to am BB    HEENT: nl dentition, turbinates, and oropharynx. Nl external ear canals without cough reflex   NECK :  without JVD/Nodes/TM/ nl carotid upstrokes bilaterally   LUNGS: no acc muscle use,  Nl contour chest which is  clear to A and P bilaterally without cough on insp or exp maneuvers   CV:  IRIR  no s3 or murmur or increase in P2, no edema   ABD:  soft and nontender with nl inspiratory excursion in the supine position. No bruits or organomegaly, bowel sounds nl  MS:  Nl gait/ ext warm without deformities, calf tenderness, cyanosis or clubbing No obvious joint restrictions   SKIN: warm and dry without lesions    NEURO:  alert, approp, nl sensorium with  no motor deficits       CXR PA and Lateral:   01/20/2016 :    I personally reviewed images and agree with radiology impression as follows:   The heart size and mediastinal contours are within normal limits. Both lungs are clear. The visualized skeletal structures are Unremarkable.     MR Brain 10/217 Mild moderate mucosal thickening right maxillary sinus with air-fluid level suggesting acute sinusitis.     I personally reviewed images and agree with radiology impression as follows:   Chest LDCT   10/03/16 Scattered calcified granulomas. A pleural-based right lower lobe density dependently measures volume derived equivalent diameter 13.4 mm, including on image 157/series 3. Other noncalcified pulmonary nodules, the largest of which is positioned within the lingula and measures volume derived equivalent diameter 5.5 mm on image 183/series 3.              Assessment & Plan:

## 2016-10-09 NOTE — Telephone Encounter (Signed)
Pt was seen at 8:45 this morning.  Will close encounter.

## 2016-10-25 ENCOUNTER — Other Ambulatory Visit: Payer: Self-pay | Admitting: Internal Medicine

## 2016-10-25 NOTE — Telephone Encounter (Signed)
Age 75years Wt 100.5kg  10/09/2016 Saw Dr Lovena Le 04/25/2016 04/05/2016 SrCr 0.71 03/15/2016 Hgb 14.1 HCT 41.3  CrCl 127.78 Refill done for Xarelto 20 mg daily

## 2016-11-02 ENCOUNTER — Encounter (INDEPENDENT_AMBULATORY_CARE_PROVIDER_SITE_OTHER): Payer: Medicare Other | Admitting: Ophthalmology

## 2016-11-02 DIAGNOSIS — I10 Essential (primary) hypertension: Secondary | ICD-10-CM | POA: Diagnosis not present

## 2016-11-02 DIAGNOSIS — H35033 Hypertensive retinopathy, bilateral: Secondary | ICD-10-CM

## 2016-11-02 DIAGNOSIS — H353122 Nonexudative age-related macular degeneration, left eye, intermediate dry stage: Secondary | ICD-10-CM | POA: Diagnosis not present

## 2016-11-02 DIAGNOSIS — H43813 Vitreous degeneration, bilateral: Secondary | ICD-10-CM | POA: Diagnosis not present

## 2016-11-02 DIAGNOSIS — H318 Other specified disorders of choroid: Secondary | ICD-10-CM

## 2016-11-20 ENCOUNTER — Ambulatory Visit (INDEPENDENT_AMBULATORY_CARE_PROVIDER_SITE_OTHER): Payer: Medicare Other | Admitting: Internal Medicine

## 2016-11-20 ENCOUNTER — Encounter: Payer: Self-pay | Admitting: Internal Medicine

## 2016-11-20 VITALS — BP 120/78 | HR 77 | Ht 70.0 in | Wt 220.4 lb

## 2016-11-20 DIAGNOSIS — I471 Supraventricular tachycardia: Secondary | ICD-10-CM

## 2016-11-20 DIAGNOSIS — I481 Persistent atrial fibrillation: Secondary | ICD-10-CM | POA: Diagnosis not present

## 2016-11-20 DIAGNOSIS — I4819 Other persistent atrial fibrillation: Secondary | ICD-10-CM

## 2016-11-20 MED ORDER — ROSUVASTATIN CALCIUM 5 MG PO TABS: 5.0000 mg | ORAL_TABLET | Freq: Every morning | ORAL | 3 refills | Status: DC

## 2016-11-20 NOTE — Patient Instructions (Signed)

## 2016-11-20 NOTE — Progress Notes (Signed)
HPI [name] returns today for ongoing evaluation and management of atrial fibrillation and atrial tachycardia, status post initiation of dofetilide therapy. He is a pleasant 76 year old man who also is a history of pulmonary nodules. He has had known atrial fibrillation and flutter in the past. He was begun on dofetilide back in February. Since then he has done well with no symptomatic atrial fibrillation or tachycardia. He has rare palpitations and feels like his heart skips a beat. He has not had syncope. No peripheral edema. No chest pain or shortness of breath. He states that he drinks one coffee drink and 2 alcoholic drinks daily. Allergies  Allergen Reactions  . Tetanus Toxoid Anaphylaxis, Hives and Other (See Comments)    REACTION: hives/throat swells shut  . Fish Oil Other (See Comments)    Stomach cramps, can't eat   . Glucosamine-Chondroitin Other (See Comments)    Stomach cramps, can't eat  . Ibuprofen Nausea Only and Other (See Comments)    Stomach pains  . Naproxen Diarrhea     Current Outpatient Prescriptions  Medication Sig Dispense Refill  . acetaminophen (TYLENOL) 325 MG tablet Take 650 mg by mouth every 6 (six) hours as needed for moderate pain.    Marland Kitchen amLODipine (NORVASC) 5 MG tablet Take 1 tablet (5 mg total) by mouth daily. 90 tablet 2  . Cholecalciferol (VITAMIN D3) 5000 units TABS Take 5,000 Units by mouth daily.    . Cyanocobalamin (VITAMIN B-12) 500 MCG SUBL Place 500 mcg under the tongue 3 (three) times a week.     . dofetilide (TIKOSYN) 250 MCG capsule Take 1 capsule (250 mcg total) by mouth 2 (two) times daily. 180 capsule 3  . irbesartan (AVAPRO) 150 MG tablet Take 1 tablet (150 mg total) by mouth daily. 90 tablet 0  . magnesium oxide (MAG-OX) 400 MG tablet Take 400 mg by mouth daily.    . Melatonin 10 MG TABS Take 10 mg by mouth at bedtime.     . metoprolol tartrate (LOPRESSOR) 25 MG tablet Take 1 tablet (25 mg total) by mouth 2 (two) times daily.  180 tablet 2  . Multiple Vitamins-Minerals (PRESERVISION AREDS 2 PO) Take 1 capsule by mouth 2 (two) times daily.    Marland Kitchen omeprazole (PRILOSEC) 40 MG capsule Take 40 mg by mouth every morning.     Marland Kitchen PARoxetine (PAXIL) 20 MG tablet Take 20 mg by mouth every evening.      . polycarbophil (FIBERCON) 625 MG tablet Take 625 mg by mouth daily.      [name] Glycol-Propyl Glycol (SYSTANE OP) Place 2 drops into both eyes 2 (two) times daily.    . rosuvastatin (CRESTOR) 5 MG tablet Take 5 mg by mouth every morning.     Alveda Reasons 20 MG TABS tablet TAKE 1 TABLET BY MOUTH  DAILY AFTER SUPPER 90 tablet 1   No current facility-administered medications for this visit.      Past Medical History:  Diagnosis Date  . Arthritis   . Atrial fibrillation, persistent ([place])   . Chronic sinusitis   . Coronary artery disease   . Depression   . Difficult intubation    was told with shoulder done 2006-alittle narrow  . 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 Hypertension   . Jejunostomy tube fell out   . 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 Spastic colon   . Stroke (cerebrum) ([place])   . SVT (supraventricular tachycardia) ([place])  ROS:   All systems reviewed and negative except as noted in the HPI.   Past Surgical History:  Procedure Laterality Date  . ESOPHAGEAL DILATION  2017  . FOOT NEUROMA SURGERY  2002  . LEFT HEART CATHETERIZATION WITH CORONARY ANGIOGRAM Right 06/14/2011   20% LM, chronic occluded mid LAD, 50% ostial LCX, 20% mid RI, RCA with collaterals to mid LAD, mid 10% stenosis, EF 60% 06/14/11  . NASAL SINUS SURGERY    . SHOULDER ARTHROSCOPY  03/01/2011   Procedure: ARTHROSCOPY SHOULDER;  Surgeon: Ninetta Lights, MD;  Location: [facility];  Service: Orthopedics;  Laterality: Right;  arthroscopy shoulder decompression subacromial partial acromioplasty with coracoacromial release, distal claviculectomy, debridement of labrium  . SHOULDER ARTHROSCOPY  03/01/2011    Procedure: ARTHROSCOPY SHOULDER;  Surgeon: Ninetta Lights, MD;  Location: [facility];  Service: Orthopedics;  Laterality: Right;  arthroscopy shoulder decompression subacromial partial acromioplasty with coracoacromial release, distal claviculectomy, debridement of labrium  . TEE WITHOUT CARDIOVERSION N/A 03/19/2016   Procedure: TRANSESOPHAGEAL ECHOCARDIOGRAM (TEE);  Surgeon: Larey Dresser, MD;  Location: [facility];  Service: Cardiovascular;  Laterality: N/A;  . TOTAL KNEE ARTHROPLASTY Right 09/23/2013   Procedure: TOTAL KNEE ARTHROPLASTY;  Surgeon: Ninetta Lights, MD;  Location: [facility];  Service: Orthopedics;  Laterality: Right;  . TOTAL SHOULDER ARTHROPLASTY  06/28/2011   Procedure: TOTAL SHOULDER ARTHROPLASTY;  Surgeon: Ninetta Lights, MD;  Location: [facility];  Service: Orthopedics;  Laterality: Right;  . UVULOPALATOPHARYNGOPLASTY       Family History  Problem Relation Age of Onset  . Cancer Mother        colon  . Heart attack Father   . Migraines Brother      Social History   Social History  . Marital status: Married    Spouse name: N/A  . Number of children: 3  . Years of education: N/A   Occupational History  . retired    Social History Main Topics  . Smoking status: Former Smoker    Packs/day: 2.00    Years: 30.00    Types: Cigarettes    Quit date: 02/27/1998  . Smokeless tobacco: Never Used  . Alcohol use 8.4 oz/week    14 Standard drinks or equivalent per week     Comment: daily  . Drug use: No  . Sexual activity: Not on file   Other Topics Concern  . Not on file   Social History Narrative   Patient is left handed.   Patient drinks one cup caffeine daily.     BP 120/78   Pulse 77   Ht 5\' 10"  (1.778 m)   Wt 220 lb 6.4 oz (100 kg)   SpO2 98%   BMI 31.62 kg/m   Physical Exam:  Well appearing 76 year old man, NAD HEENT: Unremarkable Neck:  6 cm JVD, no thyromegally Lymphatics:  No adenopathy Back:  No CVA  tenderness Lungs:  Clear, with no wheezes, rales, or rhonchi. HEART:  Regular rate rhythm, no murmurs, no rubs, no clicks Abd:  soft, positive bowel sounds, no organomegally, no rebound, no guarding Ext:  2 plus pulses, no edema, no cyanosis, no clubbing Skin:  No rashes no nodules Neuro:  CN II through XII intact, motor grossly intact  EKG - reviewed by me. normal sinus rhythm with QTC of 475  Assess/Plan: 1. Atrial fibrillation - he is maintaining sinus rhythm very nicely on dofetilide. He will continue his systemic anticoagulation. 2. Atrial tachycardia -  he is maintaining sinus rhythm very nicely. He will continue his current medications. 3. Coronary artery disease - he has a known occluded LAD with collateral circulation. He denies anginal symptoms. 4. Carotid vascular disease status post stroke - he has no deficit today. He will continue lifelong systemic anticoagulation.  Cristopher Peru, M.D.

## 2016-11-30 ENCOUNTER — Encounter (INDEPENDENT_AMBULATORY_CARE_PROVIDER_SITE_OTHER): Payer: Medicare Other | Admitting: Ophthalmology

## 2016-11-30 DIAGNOSIS — H35033 Hypertensive retinopathy, bilateral: Secondary | ICD-10-CM

## 2016-11-30 DIAGNOSIS — H318 Other specified disorders of choroid: Secondary | ICD-10-CM | POA: Diagnosis not present

## 2016-11-30 DIAGNOSIS — H353122 Nonexudative age-related macular degeneration, left eye, intermediate dry stage: Secondary | ICD-10-CM | POA: Diagnosis not present

## 2016-11-30 DIAGNOSIS — H43813 Vitreous degeneration, bilateral: Secondary | ICD-10-CM

## 2016-11-30 DIAGNOSIS — I10 Essential (primary) hypertension: Secondary | ICD-10-CM

## 2016-11-30 DIAGNOSIS — D3131 Benign neoplasm of right choroid: Secondary | ICD-10-CM

## 2016-12-17 ENCOUNTER — Other Ambulatory Visit (HOSPITAL_BASED_OUTPATIENT_CLINIC_OR_DEPARTMENT_OTHER): Payer: Self-pay

## 2016-12-17 DIAGNOSIS — G4730 Sleep apnea, unspecified: Secondary | ICD-10-CM

## 2016-12-26 ENCOUNTER — Other Ambulatory Visit: Payer: Self-pay | Admitting: Internal Medicine

## 2016-12-27 ENCOUNTER — Encounter (INDEPENDENT_AMBULATORY_CARE_PROVIDER_SITE_OTHER): Payer: Medicare Other | Admitting: Ophthalmology

## 2016-12-27 ENCOUNTER — Other Ambulatory Visit: Payer: Self-pay | Admitting: Internal Medicine

## 2016-12-27 DIAGNOSIS — H43813 Vitreous degeneration, bilateral: Secondary | ICD-10-CM

## 2016-12-27 DIAGNOSIS — D3131 Benign neoplasm of right choroid: Secondary | ICD-10-CM | POA: Diagnosis not present

## 2016-12-27 DIAGNOSIS — H35033 Hypertensive retinopathy, bilateral: Secondary | ICD-10-CM

## 2016-12-27 DIAGNOSIS — I10 Essential (primary) hypertension: Secondary | ICD-10-CM

## 2016-12-27 DIAGNOSIS — H318 Other specified disorders of choroid: Secondary | ICD-10-CM | POA: Diagnosis not present

## 2016-12-27 DIAGNOSIS — H353122 Nonexudative age-related macular degeneration, left eye, intermediate dry stage: Secondary | ICD-10-CM | POA: Diagnosis not present

## 2016-12-27 NOTE — Telephone Encounter (Signed)
Medication Detail    Disp Refills Start End   irbesartan (AVAPRO) 150 MG tablet 90 tablet 3 12/27/2016    Sig: TAKE 1 TABLET(150 MG) BY MOUTH DAILY   Sent to pharmacy as: irbesartan (AVAPRO) 150 MG tablet   E-Prescribing Status: Receipt confirmed by pharmacy (12/27/2016 3:10 PM EDT)   [facility] [id number] - [place], [place] - [address] AT [place]

## 2016-12-31 ENCOUNTER — Other Ambulatory Visit: Payer: Self-pay

## 2016-12-31 MED ORDER — IRBESARTAN 150 MG PO TABS: ORAL_TABLET | ORAL | 3 refills | Status: DC

## 2017-01-04 ENCOUNTER — Telehealth: Payer: Self-pay | Admitting: Internal Medicine

## 2017-01-04 NOTE — Telephone Encounter (Signed)
New message     Pt c/o Shortness Of Breath: STAT if SOB developed within the last 24 hours or pt is noticeably SOB on the phone  1. Are you currently SOB (can you hear that pt is SOB on the phone)? On phone with daughter   2. How long have you been experiencing SOB?a couple weeks, getting worse the last couple of days  3. Are you SOB when sitting or when up moving around?all the time , worse when he walks , having trouble sleeping    4. Are you currently experiencing any other symptoms? Daughter did not know , patient is out of town and he is on his way back, because he started feeling worse

## 2017-01-04 NOTE — Telephone Encounter (Signed)
Per review of Pt chart, Pt to see PA 01/07/2017.  No further action needed at this time.

## 2017-01-05 NOTE — Progress Notes (Signed)
Cardiology Office Note    Date:  01/07/2017   ID:  Catlin, Aycock 22-Mar-1940, MRN [id number]  PCP:  [name], MD  Cardiologist/Electrophysiologist: Dr.Taylor (Previously Dr. Einar Gip - Hasn't seen since 08/2015 admission).   Chief Complaint: SOB  History of Present Illness:   George Vega is a 76 y.o. male pulmonary nodules, paroxysmal atrial fibrillation, atrial tachycardia, CVA, HTN and CAD presented for SOB.   Hx of CAD with known occluded LAD which is very well collateralized with normal LV systolic function by coronary angiography in April 2013. Last echo 03/2016 showed LVEF of 55%.   His tachycardia improved after initiation of dofetilide therapy. He was maintaining sinus rhythm and doing well on cardiac stand point when last seen by Dr. Lovena Le 11/20/16.   Patient is here for evaluation of shortness of breath x few weeks.  Any change in position and activity exacerbated his symptoms.  He also feels intermittent chest fluttering with shortness of breath. Occurs multiples times/day and last for approximately 10 minutes at a time.  He gets shortness of breath while walking to mailbox.  He cannot lay flat and has orthopnea.  No PND, dizziness, lower extremity edema, melena or blood in his stool or urine.   Past Medical History:  Diagnosis Date  . Arthritis   . Atrial fibrillation, persistent ([place])   . Chronic sinusitis   . Coronary artery disease   . Depression   . Difficult intubation    was told with shoulder done 2006-alittle narrow  . 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 Hypertension   . Jejunostomy tube fell out   . 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 Spastic colon   . Stroke (cerebrum) ([place])   . SVT (supraventricular tachycardia) ([facility])     Past Surgical History:  Procedure Laterality Date  . ESOPHAGEAL DILATION  2017  . FOOT NEUROMA SURGERY  2002  . LEFT HEART CATHETERIZATION WITH CORONARY ANGIOGRAM Right 06/14/2011   20% LM,  chronic occluded mid LAD, 50% ostial LCX, 20% mid RI, RCA with collaterals to mid LAD, mid 10% stenosis, EF 60% 06/14/11  . NASAL SINUS SURGERY    . SHOULDER ARTHROSCOPY  03/01/2011   Procedure: ARTHROSCOPY SHOULDER;  Surgeon: Ninetta Lights, MD;  Location: [facility];  Service: Orthopedics;  Laterality: Right;  arthroscopy shoulder decompression subacromial partial acromioplasty with coracoacromial release, distal claviculectomy, debridement of labrium  . SHOULDER ARTHROSCOPY  03/01/2011   Procedure: ARTHROSCOPY SHOULDER;  Surgeon: Ninetta Lights, MD;  Location: [facility];  Service: Orthopedics;  Laterality: Right;  arthroscopy shoulder decompression subacromial partial acromioplasty with coracoacromial release, distal claviculectomy, debridement of labrium  . TEE WITHOUT CARDIOVERSION N/A 03/19/2016   Procedure: TRANSESOPHAGEAL ECHOCARDIOGRAM (TEE);  Surgeon: Larey Dresser, MD;  Location: [facility];  Service: Cardiovascular;  Laterality: N/A;  . TOTAL KNEE ARTHROPLASTY Right 09/23/2013   Procedure: TOTAL KNEE ARTHROPLASTY;  Surgeon: Ninetta Lights, MD;  Location: [facility];  Service: Orthopedics;  Laterality: Right;  . TOTAL SHOULDER ARTHROPLASTY  06/28/2011   Procedure: TOTAL SHOULDER ARTHROPLASTY;  Surgeon: Ninetta Lights, MD;  Location: [facility];  Service: Orthopedics;  Laterality: Right;  . UVULOPALATOPHARYNGOPLASTY      Current Medications: Prior to Admission medications   Medication Sig Start Date End Date Taking? Authorizing Provider  acetaminophen (TYLENOL) 325 MG tablet Take 650 mg by mouth every 6 (six) hours as needed for moderate pain.  [provider]  amLODipine (NORVASC) 5 MG tablet Take 1 tablet (5 mg total) by mouth daily. 07/26/16   Evans Lance, MD  Cholecalciferol (VITAMIN D3) 5000 units TABS Take 5,000 Units by mouth daily.    [provider]  Cyanocobalamin (VITAMIN B-12) 500 MCG SUBL Place 500 mcg  under the tongue 3 (three) times a week.     [provider]  dofetilide (TIKOSYN) 250 MCG capsule Take 1 capsule (250 mcg total) by mouth 2 (two) times daily. 04/25/16   Evans Lance, MD  irbesartan (AVAPRO) 150 MG tablet TAKE 1 TABLET(150 MG) BY MOUTH DAILY 12/31/16   Evans Lance, MD  magnesium oxide (MAG-OX) 400 MG tablet Take 400 mg by mouth daily.    [provider]  Melatonin 10 MG TABS Take 10 mg by mouth at bedtime.     [provider]  metoprolol tartrate (LOPRESSOR) 25 MG tablet Take 1 tablet (25 mg total) by mouth 2 (two) times daily. 07/26/16   Evans Lance, MD  Multiple Vitamins-Minerals (PRESERVISION AREDS 2 PO) Take 1 capsule by mouth 2 (two) times daily.    [provider]  omeprazole (PRILOSEC) 40 MG capsule Take 40 mg by mouth every morning.  10/17/15   [provider]  PARoxetine (PAXIL) 20 MG tablet Take 20 mg by mouth every evening.      [provider]  polycarbophil (FIBERCON) 625 MG tablet Take 625 mg by mouth daily.      [provider]  Polyethyl Glycol-Propyl Glycol (SYSTANE OP) Place 2 drops into both eyes 2 (two) times daily.    [provider]  rosuvastatin (CRESTOR) 5 MG tablet Take 1 tablet (5 mg total) by mouth every morning. 11/20/16   Evans Lance, MD  XARELTO 20 MG TABS tablet TAKE 1 TABLET BY MOUTH  DAILY AFTER SUPPER 10/25/16   Evans Lance, MD    Allergies:   Tetanus toxoid; Fish oil; Glucosamine-chondroitin; Ibuprofen; and Naproxen   Social History   Social History  . Marital status: Married    Spouse name: N/A  . Number of children: 3  . Years of education: N/A   Occupational History  . retired    Social History Main Topics  . Smoking status: Former Smoker    Packs/day: 2.00    Years: 30.00    Types: Cigarettes    Quit date: 02/27/1998  . Smokeless tobacco: Never Used  . Alcohol use 8.4 oz/week    14 Standard drinks or equivalent per week     Comment: daily    . Drug use: No  . Sexual activity: Not Asked   Other Topics Concern  . None   Social History Narrative   Patient is left handed.   Patient drinks one cup caffeine daily.     Family History:  The patient's family history includes Cancer in his mother; Heart attack in his father; Migraines in his brother.   ROS:   Please see the history of present illness.    ROS All other systems reviewed and are negative.   PHYSICAL EXAM:   VS:  BP 130/78   Pulse 68   Ht 5\' 10"  (1.778 m)   Wt 228 lb 12.8 oz (103.8 kg)   SpO2 97%   BMI 32.83 kg/m    GEN: Well nourished, well developed, in no acute distress  HEENT: normal  Neck: no JVD, carotid bruits, or masses Cardiac:RRR; no murmurs, rubs, or gallops,no edema  Respiratory: Bibasilar Rales R > L GI: soft, nontender, nondistended, + BS MS: no deformity or atrophy  Skin: warm and dry, no rash Neuro:  Alert and Oriented x 3, Strength and sensation are intact Psych: euthymic mood, full affect  Wt Readings from Last 3 Encounters:  01/07/17 228 lb 12.8 oz (103.8 kg)  11/20/16 220 lb 6.4 oz (100 kg)  10/09/16 221 lb 9.6 oz (100.5 kg)      Studies/Labs Reviewed:   EKG:  EKG is ordered today.  The ekg ordered today demonstrates sinus rhythm  Recent Labs: 03/15/2016: Hemoglobin 14.1; NT-Pro BNP 195; Platelets 215 04/05/2016: BUN 9; Creatinine, Ser 0.71; Magnesium 1.8; Potassium 3.9; Sodium 141   Lipid Panel No results found for: CHOL, TRIG, HDL, CHOLHDL, VLDL, LDLCALC, LDLDIRECT  Additional studies/ records that were reviewed today include:   Echocardiogram:03/19/16 Study Conclusions  - Left ventricle: The cavity size was normal. Wall thickness was   increased in a pattern of mild LVH. The estimated ejection   fraction was 55%. Wall motion was normal; there were no regional   wall motion abnormalities. - Aortic valve: There was no stenosis. - Aorta: Normal caliber aorta with grade III plaque descending   thoracic aorta. - Mitral  valve: There was mild regurgitation. - Left atrium: The atrium was mildly to moderately dilated. No   evidence of thrombus in the atrial cavity or appendage. - Right ventricle: The cavity size was normal. Systolic function   was normal. - Right atrium: The atrium was mildly to moderately dilated. - Tricuspid valve: There was moderate regurgitation. Peak RV-RA   gradient (S): 27 mm Hg.   Cardiac Catheterization:  06/14/11 Left Ventriculography:             EF:  60%             Wall Motion: Normal  Coronary Angiographic Data:  Left Main:  Distal 20%             Left Anterior Descending (LAD):  Chronic total occlusion at mid LAD. Type II collaterals for RCA. No change from 2010.  1st diagonal (D1):  Large and comes off before CTO         2nd diagonal (D2):  small  3rd diagonal (D3):  NA  Circumflex (LCx):  Ostial 50% no change from 2010.  1st obtuse marginal:  Normal                                                          Ramus Intermedius:  Large. Mid 20%. No change from 2010    Right Coronary Artery: Dominant. Gives collaterals to occluded mid LAD. 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 Circumflex coronary artery has a ostial 50% stenoses. Coronary anatomy is unchanged from 2010.   Plan:  Patient can have left shoulder replacement surgery with acceptable cardiovascular risk. I will discuss further the angiographic data with my other interventional colleagues and see if bringing him back on an elective fashion for revascularization of CTO he is to be contemplated.    ASSESSMENT & PLAN:    1. PAF -Maintaining  sinus rhythm on EKG.  Chest fluttering with exacerbation of shortness of breath at times.  Will get holter monitor for further  evaluation.  Continue Tikosyn and metoprolol at current dose.  Continue Xarelto for anticoagulation.  No bleeding issue.Will Check electrolytes as well.   2. CAD - Known occluded LAD with good with collaterals from the right coronary.  He has a dyspnea on exertion.  He cannot tell if his symptoms is similar to prior angina or not.  Get [employer].  3. Orthopena/SOB - +fluids in lungs. ? Due to afib. Echo 03/2016 showed normal LVEF. Check BNP. Has gained 9 lb in past 3 months. Start short term lasix.   4. HTN - STable on current medications.   Medication Adjustments/Labs and Tests Ordered: Current medicines are reviewed at length with the patient today.  Concerns regarding medicines are outlined above.  Medication changes, Labs and Tests ordered today are listed in the Patient Instructions below. Patient Instructions  Medication Instructions:  1.START LASIX 40 MG DAILY FOR 3 DAYS THEN ONLY TAKE AS NEEDED FOR WEIGHT GAIN 3 LB'S OR MORE IN 1 DAY OR 5 LB'S IN 1 WEEK; IF YOU TAKE THE LASIX AFTER THE 3 DAYS PLEASE CALL THE OFFICE FOR FURTHER ADVICE.  2. START POTASSIUM 10 MEQ DAILY FOR 3 DAYS; TAKE WITH THE LASIX. ONLY TAKE IF YOU TAKE THE LASIX AFTER THE 3 DAYS  Labwork: 1. TODAY BMET, MAGNESIUM LEVEL, PRO BNP  Testing/Procedures: 1. Your physician has recommended that you wear a 24 HOUR holter monitor. Holter monitors are medical devices that record the heart's electrical activity. Doctors most often use these monitors to diagnose arrhythmias. Arrhythmias are problems with the speed or rhythm of the heartbeat. The monitor is a small, portable device. You can wear one while you do your normal daily activities. This is usually used to diagnose what is causing palpitations/syncope (passing out).  2. Your physician has requested that you have a lexiscan myoview. For further information please visit [email or web address]. Please follow instruction sheet, as given.    Follow-Up: 2-3 WEEKS WITH  DR. Lovena Le  Any Other Special Instructions Will Be Listed Below (If Applicable).     If you need a refill on your cardiac medications before your next appointment, please call your pharmacy.      Jarrett Soho, [place]  01/07/2017 3:20 PM    [facility] [address], [place], [place]  [address] Phone: [phone]; Fax: [phone]

## 2017-01-07 ENCOUNTER — Encounter: Payer: Self-pay | Admitting: Physician Assistant

## 2017-01-07 ENCOUNTER — Ambulatory Visit (INDEPENDENT_AMBULATORY_CARE_PROVIDER_SITE_OTHER): Payer: Medicare Other | Admitting: Physician Assistant

## 2017-01-07 VITALS — BP 130/78 | HR 68 | Ht 70.0 in | Wt 228.8 lb

## 2017-01-07 DIAGNOSIS — R0602 Shortness of breath: Secondary | ICD-10-CM

## 2017-01-07 DIAGNOSIS — I2510 Atherosclerotic heart disease of native coronary artery without angina pectoris: Secondary | ICD-10-CM

## 2017-01-07 DIAGNOSIS — I4819 Other persistent atrial fibrillation: Secondary | ICD-10-CM

## 2017-01-07 DIAGNOSIS — Z79899 Other long term (current) drug therapy: Secondary | ICD-10-CM | POA: Diagnosis not present

## 2017-01-07 DIAGNOSIS — I481 Persistent atrial fibrillation: Secondary | ICD-10-CM

## 2017-01-07 DIAGNOSIS — I10 Essential (primary) hypertension: Secondary | ICD-10-CM | POA: Diagnosis not present

## 2017-01-07 DIAGNOSIS — Z5181 Encounter for therapeutic drug level monitoring: Secondary | ICD-10-CM | POA: Diagnosis not present

## 2017-01-07 MED ORDER — FUROSEMIDE 40 MG PO TABS: 40.0000 mg | ORAL_TABLET | ORAL | 6 refills | Status: DC

## 2017-01-07 MED ORDER — POTASSIUM CHLORIDE ER 10 MEQ PO TBCR: 10.0000 meq | EXTENDED_RELEASE_TABLET | ORAL | 6 refills | Status: DC

## 2017-01-07 NOTE — Patient Instructions (Signed)
Medication Instructions:  1.START LASIX 40 MG DAILY FOR 3 DAYS THEN ONLY TAKE AS NEEDED FOR WEIGHT GAIN 3 LB'S OR MORE IN 1 DAY OR 5 LB'S IN 1 WEEK; IF YOU TAKE THE LASIX AFTER THE 3 DAYS PLEASE CALL THE OFFICE FOR FURTHER ADVICE.  2. START POTASSIUM 10 MEQ DAILY FOR 3 DAYS; TAKE WITH THE LASIX. ONLY TAKE IF YOU TAKE THE LASIX AFTER THE 3 DAYS  Labwork: 1. TODAY BMET, MAGNESIUM LEVEL, PRO BNP  Testing/Procedures: 1. Your physician has recommended that you wear a 24 HOUR holter monitor. Holter monitors are medical devices that record the heart's electrical activity. Doctors most often use these monitors to diagnose arrhythmias. Arrhythmias are problems with the speed or rhythm of the heartbeat. The monitor is a small, portable device. You can wear one while you do your normal daily activities. This is usually used to diagnose what is causing palpitations/syncope (passing out).  2. Your physician has requested that you have a lexiscan myoview. For further information please visit [email or web address]. Please follow instruction sheet, as given.    Follow-Up: 2-3 WEEKS WITH DR. Lovena Le  Any Other Special Instructions Will Be Listed Below (If Applicable).     If you need a refill on your cardiac medications before your next appointment, please call your pharmacy.

## 2017-01-08 ENCOUNTER — Telehealth (HOSPITAL_COMMUNITY): Payer: Self-pay | Admitting: *Deleted

## 2017-01-08 LAB — BASIC METABOLIC PANEL
BUN/Creatinine Ratio: 18 (ref 10–24)
BUN: 15 mg/dL (ref 8–27)
CALCIUM: 8.8 mg/dL (ref 8.6–10.2)
CO2: 23 mmol/L (ref 20–29)
CREATININE: 0.82 mg/dL (ref 0.76–1.27)
Chloride: 103 mmol/L (ref 96–106)
GFR, EST AFRICAN AMERICAN: 99 mL/min/{1.73_m2} (ref >59)
GFR, EST NON AFRICAN AMERICAN: 86 mL/min/{1.73_m2} (ref >59)
Glucose: 120 mg/dL — ABNORMAL HIGH (ref 65–99)
POTASSIUM: 4.7 mmol/L (ref 3.5–5.2)
Sodium: 142 mmol/L (ref 134–144)

## 2017-01-08 LAB — PRO B NATRIURETIC PEPTIDE: NT-Pro BNP: 309 pg/mL (ref 0–486)

## 2017-01-08 LAB — MAGNESIUM: Magnesium: 2.1 mg/dL (ref 1.6–2.3)

## 2017-01-08 NOTE — Telephone Encounter (Signed)
Patient given detailed instructions per Myocardial Perfusion Study Information Sheet for the test on 1103/03/20 at 1000. Patient notified to arrive 15 minutes early and that it is imperative to arrive on time for appointment to keep from having the test rescheduled.  If you need to cancel or reschedule your appointment, please call the office within 24 hours of your appointment. . Patient verbalized understanding.Sherrie Marsan, [place]

## 2017-01-09 ENCOUNTER — Encounter (HOSPITAL_BASED_OUTPATIENT_CLINIC_OR_DEPARTMENT_OTHER): Payer: Medicare Other

## 2017-01-09 ENCOUNTER — Ambulatory Visit (INDEPENDENT_AMBULATORY_CARE_PROVIDER_SITE_OTHER): Payer: Medicare Other

## 2017-01-09 DIAGNOSIS — I481 Persistent atrial fibrillation: Secondary | ICD-10-CM

## 2017-01-09 DIAGNOSIS — I4819 Other persistent atrial fibrillation: Secondary | ICD-10-CM

## 2017-01-10 ENCOUNTER — Telehealth: Payer: Self-pay | Admitting: Internal Medicine

## 2017-01-10 NOTE — Telephone Encounter (Signed)
Walk in pt Form-Pt needs medication for Anxiety. Placed in [place] [facility] Box/Km

## 2017-01-11 ENCOUNTER — Telehealth: Payer: Self-pay | Admitting: Internal Medicine

## 2017-01-11 NOTE — Telephone Encounter (Signed)
Call returned to Pt.  Notified Pt that irbesartan recall at this time was very small, advised Pt to call pharmacy and see if his current medication is part of the recall. Pt recently saw APP d/t increasing sob.  Pt unsure if it is anxiety or heart related.  He does seem to think the lasix has helped.  Pt set up for stress test 11/5 and then follows with Dr. Lovena Le after that.  Notified Pt that after this work up we should be able to tell if the sob is related to his heart vs anxiety.   Pt to call pharmacy, will call this nurse back if his medication is part of recall.  Pt thanked nurse for this call, will continue to monitor.

## 2017-01-11 NOTE — Telephone Encounter (Signed)
New message     Patient wife calling regarding recall report on irbesartan (AVAPRO) 150 MG tablet. Wants to know what is the replacement medication. Also requesting medication for anxiety due to breathing issues. Please call    Pt c/o medication issue:  1. Name of Medication: irbesartan (AVAPRO) 150 MG tablet  2. How are you currently taking this medication (dosage and times per day)? As prescribed  3. Are you having a reaction (difficulty breathing--STAT)? no  4. What is your medication issue? Recall on irbesartan (AVAPRO) 150 MG tablet

## 2017-01-11 NOTE — Telephone Encounter (Signed)
The irbesartan recall has only affected ~1% of irbesartan at this time - pt will need to contact his pharmacy to see if his supply was affected since we do not know which manufacturer he received. Otherwise, he would switch to losartan since valsartan was also previously recalled - an equivalent dose would be losartan 50mg  daily.

## 2017-01-14 ENCOUNTER — Telehealth (HOSPITAL_COMMUNITY): Payer: Self-pay

## 2017-01-14 ENCOUNTER — Other Ambulatory Visit (HOSPITAL_COMMUNITY): Payer: Self-pay | Admitting: Interventional Radiology

## 2017-01-14 ENCOUNTER — Ambulatory Visit (HOSPITAL_COMMUNITY): Payer: Medicare Other | Attending: Cardiology

## 2017-01-14 DIAGNOSIS — R42 Dizziness and giddiness: Secondary | ICD-10-CM | POA: Diagnosis not present

## 2017-01-14 DIAGNOSIS — Z8673 Personal history of transient ischemic attack (TIA), and cerebral infarction without residual deficits: Secondary | ICD-10-CM | POA: Insufficient documentation

## 2017-01-14 DIAGNOSIS — I2510 Atherosclerotic heart disease of native coronary artery without angina pectoris: Secondary | ICD-10-CM | POA: Insufficient documentation

## 2017-01-14 DIAGNOSIS — I481 Persistent atrial fibrillation: Secondary | ICD-10-CM | POA: Diagnosis not present

## 2017-01-14 DIAGNOSIS — I4819 Other persistent atrial fibrillation: Secondary | ICD-10-CM

## 2017-01-14 DIAGNOSIS — I10 Essential (primary) hypertension: Secondary | ICD-10-CM | POA: Diagnosis not present

## 2017-01-14 DIAGNOSIS — R002 Palpitations: Secondary | ICD-10-CM | POA: Diagnosis not present

## 2017-01-14 DIAGNOSIS — I4891 Unspecified atrial fibrillation: Secondary | ICD-10-CM | POA: Diagnosis not present

## 2017-01-14 DIAGNOSIS — R0602 Shortness of breath: Secondary | ICD-10-CM | POA: Insufficient documentation

## 2017-01-14 DIAGNOSIS — R9439 Abnormal result of other cardiovascular function study: Secondary | ICD-10-CM | POA: Diagnosis not present

## 2017-01-14 DIAGNOSIS — I771 Stricture of artery: Secondary | ICD-10-CM

## 2017-01-14 LAB — MYOCARDIAL PERFUSION IMAGING
CSEPPHR: 85 {beats}/min
LVDIAVOL: 126 mL (ref 62–150)
LVSYSVOL: 56 mL
RATE: 0.39
Rest HR: 69 {beats}/min
SDS: 0
SRS: 3
SSS: 3
TID: 1.13

## 2017-01-14 MED ORDER — REGADENOSON 0.4 MG/5ML IV SOLN
0.4000 mg | Freq: Once | INTRAVENOUS | Status: AC
  Administered 2017-01-14: 0.4 mg via INTRAVENOUS

## 2017-01-14 MED ORDER — TECHNETIUM TC 99M TETROFOSMIN IV KIT
8.5000 | PACK | Freq: Once | INTRAVENOUS | Status: AC | PRN
  Administered 2017-01-14: 8.5 via INTRAVENOUS
  Filled 2017-01-14: qty 9

## 2017-01-14 MED ORDER — TECHNETIUM TC 99M TETROFOSMIN IV KIT
30.5000 | PACK | Freq: Once | INTRAVENOUS | Status: AC | PRN
  Administered 2017-01-14: 30.5 via INTRAVENOUS
  Filled 2017-01-14: qty 31

## 2017-01-14 NOTE — Telephone Encounter (Signed)
Called to schedule 6 month f/u US carotid. Left message for pt to return call. AW

## 2017-01-14 NOTE — Telephone Encounter (Signed)
No call back received.  No further action needed at this time.

## 2017-01-15 ENCOUNTER — Telehealth: Payer: Self-pay

## 2017-01-15 NOTE — Telephone Encounter (Signed)
Received walk-in form from 01/09/17 at 3:33 stating "patient needs something for anxiety- is not sleeping. Please call [facility] [address].   January 11, 2017  Damian Leavell, RN    12:12 PM  Note    Call returned to Pt.  Notified Pt that irbesartan recall at this time was very small, advised Pt to call pharmacy and see if his current medication is part of the recall. Pt recently saw APP d/t increasing sob.  Pt unsure if it is anxiety or heart related.  He does seem to think the lasix has helped.  Pt set up for stress test 11/5 and then follows with Dr. Lovena Le after that.  Notified Pt that after this work up we should be able to tell if the sob is related to his heart vs anxiety.   Pt to call pharmacy, will call this nurse back if his medication is part of recall.  Pt thanked nurse for this call, will continue to monitor.

## 2017-01-22 ENCOUNTER — Ambulatory Visit (HOSPITAL_COMMUNITY)
Admission: RE | Admit: 2017-01-22 | Discharge: 2017-01-22 | Disposition: A | Discharge status: Home / Self Care | Payer: Medicare Other | Source: Ambulatory Visit | Attending: Interventional Radiology | Admitting: Interventional Radiology

## 2017-01-22 DIAGNOSIS — I771 Stricture of artery: Secondary | ICD-10-CM

## 2017-01-22 DIAGNOSIS — I6523 Occlusion and stenosis of bilateral carotid arteries: Secondary | ICD-10-CM | POA: Diagnosis not present

## 2017-01-24 ENCOUNTER — Encounter (INDEPENDENT_AMBULATORY_CARE_PROVIDER_SITE_OTHER): Payer: Medicare Other | Admitting: Ophthalmology

## 2017-01-24 DIAGNOSIS — H353122 Nonexudative age-related macular degeneration, left eye, intermediate dry stage: Secondary | ICD-10-CM

## 2017-01-24 DIAGNOSIS — H35033 Hypertensive retinopathy, bilateral: Secondary | ICD-10-CM

## 2017-01-24 DIAGNOSIS — H43813 Vitreous degeneration, bilateral: Secondary | ICD-10-CM

## 2017-01-24 DIAGNOSIS — I10 Essential (primary) hypertension: Secondary | ICD-10-CM

## 2017-01-24 DIAGNOSIS — D3131 Benign neoplasm of right choroid: Secondary | ICD-10-CM

## 2017-01-24 DIAGNOSIS — H318 Other specified disorders of choroid: Secondary | ICD-10-CM

## 2017-01-30 ENCOUNTER — Ambulatory Visit (INDEPENDENT_AMBULATORY_CARE_PROVIDER_SITE_OTHER): Payer: Medicare Other | Admitting: Internal Medicine

## 2017-01-30 ENCOUNTER — Encounter: Payer: Self-pay | Admitting: Internal Medicine

## 2017-01-30 VITALS — BP 124/64 | HR 97 | Ht 70.0 in | Wt 226.0 lb

## 2017-01-30 DIAGNOSIS — H903 Sensorineural hearing loss, bilateral: Secondary | ICD-10-CM | POA: Insufficient documentation

## 2017-01-30 DIAGNOSIS — I2510 Atherosclerotic heart disease of native coronary artery without angina pectoris: Secondary | ICD-10-CM

## 2017-01-30 DIAGNOSIS — H905 Unspecified sensorineural hearing loss: Secondary | ICD-10-CM | POA: Insufficient documentation

## 2017-01-30 DIAGNOSIS — R0602 Shortness of breath: Secondary | ICD-10-CM | POA: Diagnosis not present

## 2017-01-30 DIAGNOSIS — Z5181 Encounter for therapeutic drug level monitoring: Secondary | ICD-10-CM | POA: Diagnosis not present

## 2017-01-30 DIAGNOSIS — M19049 Primary osteoarthritis, unspecified hand: Secondary | ICD-10-CM | POA: Insufficient documentation

## 2017-01-30 DIAGNOSIS — R7303 Prediabetes: Secondary | ICD-10-CM | POA: Insufficient documentation

## 2017-01-30 DIAGNOSIS — Z79899 Other long term (current) drug therapy: Secondary | ICD-10-CM | POA: Diagnosis not present

## 2017-01-30 DIAGNOSIS — E559 Vitamin D deficiency, unspecified: Secondary | ICD-10-CM | POA: Insufficient documentation

## 2017-01-30 DIAGNOSIS — I481 Persistent atrial fibrillation: Secondary | ICD-10-CM | POA: Diagnosis not present

## 2017-01-30 DIAGNOSIS — I4819 Other persistent atrial fibrillation: Secondary | ICD-10-CM

## 2017-01-30 NOTE — Progress Notes (Signed)
HPI [name] returns today for ongoing evaluation and management of paroxysmal atrial arrhythmias, and coronary artery disease. He has been stable after initiation of dofetilide therapy. He denies palpitations. He has had no syncope. He denies anginal symptoms. A recent stress test was low risk. No chest pain or shortness of breath. Allergies  Allergen Reactions  . Tetanus Toxoid Anaphylaxis, Hives and Other (See Comments)    REACTION: hives/throat swells shut  . Fish Oil Other (See Comments)    Stomach cramps, can't eat   . Glucosamine-Chondroitin Other (See Comments)    Stomach cramps, can't eat  . Ibuprofen Nausea Only and Other (See Comments)    Stomach pains  . Naproxen Diarrhea     Current Outpatient Medications  Medication Sig Dispense Refill  . acetaminophen (TYLENOL) 325 MG tablet Take 650 mg by mouth every 6 (six) hours as needed for moderate pain.    Marland Kitchen amLODipine (NORVASC) 5 MG tablet Take 1 tablet (5 mg total) by mouth daily. 90 tablet 2  . Cholecalciferol (VITAMIN D3) 5000 units TABS Take 5,000 Units by mouth daily.    . Cyanocobalamin (VITAMIN B-12) 500 MCG SUBL Place 500 mcg under the tongue 3 (three) times a week.     . dofetilide (TIKOSYN) 250 MCG capsule Take 1 capsule (250 mcg total) by mouth 2 (two) times daily. 180 capsule 3  . furosemide (LASIX) 40 MG tablet Take 1 tablet (40 mg total) by mouth as directed. Take 1 tablet daily for 3 days then take only as needed 30 tablet 6  . irbesartan (AVAPRO) 150 MG tablet TAKE 1 TABLET(150 MG) BY MOUTH DAILY 90 tablet 3  . magnesium oxide (MAG-OX) 400 MG tablet Take 400 mg by mouth daily.    . Melatonin 10 MG TABS Take 10 mg by mouth at bedtime.     . metoprolol tartrate (LOPRESSOR) 25 MG tablet Take 1 tablet (25 mg total) by mouth 2 (two) times daily. 180 tablet 2  . Multiple Vitamins-Minerals (PRESERVISION AREDS 2 PO) Take 1 capsule by mouth 2 (two) times daily.    Marland Kitchen omeprazole (PRILOSEC) 40 MG capsule Take 40 mg  by mouth every morning.     Marland Kitchen PARoxetine (PAXIL) 30 MG tablet Take 30 mg by mouth every morning.    . polycarbophil (FIBERCON) 625 MG tablet Take 625 mg by mouth daily.      [name] Glycol-Propyl Glycol (SYSTANE OP) Place 2 drops into both eyes 2 (two) times daily.    . potassium chloride (K-DUR) 10 MEQ tablet Take 1 tablet (10 mEq total) by mouth as directed. Take 1 tablet daily for 3 days; then only take when you take the lasix as needed 30 tablet 6  . rosuvastatin (CRESTOR) 5 MG tablet Take 1 tablet (5 mg total) by mouth every morning. 90 tablet 3  . tamsulosin (FLOMAX) 0.4 MG CAPS capsule Take 0.4 mg by mouth daily.  11  . XARELTO 20 MG TABS tablet TAKE 1 TABLET BY MOUTH  DAILY AFTER SUPPER 90 tablet 1   No current facility-administered medications for this visit.      Past Medical History:  Diagnosis Date  . Arthritis   . Atrial fibrillation, persistent ([place])   . Chronic sinusitis   . Coronary artery disease   . Depression   . Difficult intubation    was told with shoulder done 2006-alittle narrow  . 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 Hypertension   . Jejunostomy tube fell out   .  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 Spastic colon   . Stroke (cerebrum) ([place])   . SVT (supraventricular tachycardia) ([facility])     ROS:   All systems reviewed and negative except as noted in the HPI.   Past Surgical History:  Procedure Laterality Date  . ESOPHAGEAL DILATION  2017  . FOOT NEUROMA SURGERY  2002  . LEFT HEART CATHETERIZATION WITH CORONARY ANGIOGRAM Right 06/14/2011   20% LM, chronic occluded mid LAD, 50% ostial LCX, 20% mid RI, RCA with collaterals to mid LAD, mid 10% stenosis, EF 60% 06/14/11  . NASAL SINUS SURGERY    . SHOULDER ARTHROSCOPY  03/01/2011   Procedure: ARTHROSCOPY SHOULDER;  Surgeon: Ninetta Lights, MD;  Location: [facility];  Service: Orthopedics;  Laterality: Right;  arthroscopy shoulder decompression subacromial partial  acromioplasty with coracoacromial release, distal claviculectomy, debridement of labrium  . SHOULDER ARTHROSCOPY  03/01/2011   Procedure: ARTHROSCOPY SHOULDER;  Surgeon: Ninetta Lights, MD;  Location: [facility];  Service: Orthopedics;  Laterality: Right;  arthroscopy shoulder decompression subacromial partial acromioplasty with coracoacromial release, distal claviculectomy, debridement of labrium  . TEE WITHOUT CARDIOVERSION N/A 03/19/2016   Procedure: TRANSESOPHAGEAL ECHOCARDIOGRAM (TEE);  Surgeon: Larey Dresser, MD;  Location: [facility];  Service: Cardiovascular;  Laterality: N/A;  . TOTAL KNEE ARTHROPLASTY Right 09/23/2013   Procedure: TOTAL KNEE ARTHROPLASTY;  Surgeon: Ninetta Lights, MD;  Location: [facility];  Service: Orthopedics;  Laterality: Right;  . TOTAL SHOULDER ARTHROPLASTY  06/28/2011   Procedure: TOTAL SHOULDER ARTHROPLASTY;  Surgeon: Ninetta Lights, MD;  Location: [facility];  Service: Orthopedics;  Laterality: Right;  . UVULOPALATOPHARYNGOPLASTY       Family History  Problem Relation Age of Onset  . Cancer Mother        colon  . Heart attack Father   . Migraines Brother      Social History   Socioeconomic History  . Marital status: Married    Spouse name: Not on file  . Number of children: 3  . Years of education: Not on file  . Highest education level: Not on file  Social Needs  . Financial resource strain: Not on file  . Food insecurity - worry: Not on file  . Food insecurity - inability: Not on file  . Transportation needs - medical: Not on file  . Transportation needs - non-medical: Not on file  Occupational History  . Occupation: retired  Tobacco Use  . Smoking status: Former Smoker    Packs/day: 2.00    Years: 30.00    Pack years: 60.00    Types: Cigarettes    Last attempt to quit: 02/27/1998    Years since quitting: 18.9  . Smokeless tobacco: Never Used  Substance and Sexual Activity  . Alcohol use: Yes     Alcohol/week: 8.4 oz    Types: 14 Standard drinks or equivalent per week    Comment: daily  . Drug use: No  . Sexual activity: Not on file  Other Topics Concern  . Not on file  Social History Narrative   Patient is left handed.   Patient drinks one cup caffeine daily.     BP 124/64   Pulse 97   Ht 5\' 10"  (1.778 m)   Wt 226 lb (102.5 kg)   BMI 32.43 kg/m   Physical Exam:  Well appearing 76 year old man, NAD HEENT: Unremarkable Neck:  6 cm JVD, no thyromegally Lymphatics:  No adenopathy Back:  No CVA tenderness Lungs:  Clear, with no wheezes, rales, or rhonchi HEART:  Regular rate rhythm, no murmurs, no rubs, no clicks Abd:  soft, positive bowel sounds, no organomegally, no rebound, no guarding Ext:  2 plus pulses, no edema, no cyanosis, no clubbing Skin:  No rashes no nodules Neuro:  CN II through XII intact, motor grossly intact  EKG - normal sinus rhythm, corrected QT interval of 475  Assess/Plan: 1. Paroxysmal atrial fibrillation - the patient appears to be maintaining sinus rhythm very nicely on dofetilide therapy. He will continue his current medications.  2. Coronary artery disease - he has no anginal symptoms. He will continue his medical therapy. 3. Hypertension - his blood pressure appears to be well-controlled. No change in medications.  Cristopher Peru, M.D.

## 2017-01-30 NOTE — Patient Instructions (Addendum)
Medication Instructions:  Your physician has recommended you make the following change in your medication:  1.  Take an extra furosemide 40 mg tablet by mouth for 3 days as needed for swelling.  Then return to your previous schedule.    Labwork: None ordered.  Testing/Procedures: None ordered.  Follow-Up: Your physician wants you to follow-up in: 6 months with Dr. Lovena Le.   You will receive a reminder letter in the mail two months in advance. If you don't receive a letter, please call our office to schedule the follow-up appointment.   Any Other Special Instructions Will Be Listed Below (If Applicable).   If you need a refill on your cardiac medications before your next appointment, please call your pharmacy.

## 2017-02-14 ENCOUNTER — Other Ambulatory Visit: Payer: Self-pay | Admitting: Internal Medicine

## 2017-02-14 DIAGNOSIS — R911 Solitary pulmonary nodule: Secondary | ICD-10-CM

## 2017-02-14 DIAGNOSIS — C3490 Malignant neoplasm of unspecified part of unspecified bronchus or lung: Secondary | ICD-10-CM

## 2017-02-14 DIAGNOSIS — R918 Other nonspecific abnormal finding of lung field: Secondary | ICD-10-CM

## 2017-02-20 ENCOUNTER — Encounter (INDEPENDENT_AMBULATORY_CARE_PROVIDER_SITE_OTHER): Payer: Medicare Other | Admitting: Ophthalmology

## 2017-02-20 DIAGNOSIS — H35033 Hypertensive retinopathy, bilateral: Secondary | ICD-10-CM | POA: Diagnosis not present

## 2017-02-20 DIAGNOSIS — I10 Essential (primary) hypertension: Secondary | ICD-10-CM

## 2017-02-20 DIAGNOSIS — H353122 Nonexudative age-related macular degeneration, left eye, intermediate dry stage: Secondary | ICD-10-CM

## 2017-02-20 DIAGNOSIS — D3131 Benign neoplasm of right choroid: Secondary | ICD-10-CM | POA: Diagnosis not present

## 2017-02-20 DIAGNOSIS — H318 Other specified disorders of choroid: Secondary | ICD-10-CM

## 2017-02-20 DIAGNOSIS — H43813 Vitreous degeneration, bilateral: Secondary | ICD-10-CM

## 2017-03-20 ENCOUNTER — Encounter (INDEPENDENT_AMBULATORY_CARE_PROVIDER_SITE_OTHER): Payer: Medicare Other | Admitting: Ophthalmology

## 2017-03-20 DIAGNOSIS — H35033 Hypertensive retinopathy, bilateral: Secondary | ICD-10-CM

## 2017-03-20 DIAGNOSIS — I10 Essential (primary) hypertension: Secondary | ICD-10-CM

## 2017-03-20 DIAGNOSIS — H43813 Vitreous degeneration, bilateral: Secondary | ICD-10-CM | POA: Diagnosis not present

## 2017-03-20 DIAGNOSIS — H318 Other specified disorders of choroid: Secondary | ICD-10-CM | POA: Diagnosis not present

## 2017-03-20 DIAGNOSIS — D3131 Benign neoplasm of right choroid: Secondary | ICD-10-CM

## 2017-03-20 DIAGNOSIS — H353122 Nonexudative age-related macular degeneration, left eye, intermediate dry stage: Secondary | ICD-10-CM

## 2017-04-09 ENCOUNTER — Ambulatory Visit (INDEPENDENT_AMBULATORY_CARE_PROVIDER_SITE_OTHER)
Admission: RE | Admit: 2017-04-09 | Discharge: 2017-04-09 | Disposition: A | Discharge status: Home / Self Care | Payer: Medicare Other | Source: Ambulatory Visit | Attending: Internal Medicine | Admitting: Internal Medicine

## 2017-04-09 ENCOUNTER — Ambulatory Visit (HOSPITAL_BASED_OUTPATIENT_CLINIC_OR_DEPARTMENT_OTHER): Payer: Medicare Other | Attending: Urology | Admitting: Internal Medicine

## 2017-04-09 VITALS — Ht 69.0 in | Wt 216.0 lb

## 2017-04-09 DIAGNOSIS — R0902 Hypoxemia: Secondary | ICD-10-CM | POA: Insufficient documentation

## 2017-04-09 DIAGNOSIS — R918 Other nonspecific abnormal finding of lung field: Secondary | ICD-10-CM | POA: Diagnosis not present

## 2017-04-09 DIAGNOSIS — R911 Solitary pulmonary nodule: Secondary | ICD-10-CM

## 2017-04-09 DIAGNOSIS — G4733 Obstructive sleep apnea (adult) (pediatric): Secondary | ICD-10-CM | POA: Insufficient documentation

## 2017-04-09 DIAGNOSIS — C3490 Malignant neoplasm of unspecified part of unspecified bronchus or lung: Secondary | ICD-10-CM

## 2017-04-09 DIAGNOSIS — G4761 Periodic limb movement disorder: Secondary | ICD-10-CM | POA: Insufficient documentation

## 2017-04-10 ENCOUNTER — Other Ambulatory Visit: Payer: Self-pay | Admitting: Internal Medicine

## 2017-04-10 DIAGNOSIS — R911 Solitary pulmonary nodule: Secondary | ICD-10-CM

## 2017-04-14 DIAGNOSIS — G4733 Obstructive sleep apnea (adult) (pediatric): Secondary | ICD-10-CM

## 2017-04-14 NOTE — Procedures (Signed)
Patient Name: George Vega, George Vega Date: 04/09/2017 Gender: Male D.O.B: November 26, 1940 Age (years): 76 Referring Provider: Louis Meckel Height (inches): 77 Interpreting Physician: Baird Lyons MD, ABSM Weight (lbs): 216 RPSGT: Laren Everts BMI: 32 MRN: [id number] Neck Size: 17.50 <br> <br> CLINICAL INFORMATION Sleep Study Type: NPSG Indication for sleep study: Excessive Daytime Sleepiness, Fatigue, Obesity, OSA, Re-Evaluation, Snoring  Epworth Sleepiness Score: 12  SLEEP STUDY TECHNIQUE As per the AASM Manual for the Scoring of Sleep and Associated Events v2.3 (April 2016) with a hypopnea requiring 4% desaturations.  The channels recorded and monitored were frontal, central and occipital EEG, electrooculogram (EOG), submentalis EMG (chin), nasal and oral airflow, thoracic and abdominal wall motion, anterior tibialis EMG, snore microphone, electrocardiogram, and pulse oximetry.  MEDICATIONS Medications self-administered by patient taken the night of the study : TRAZODONE, ACETAMINOPHEN  SLEEP ARCHITECTURE The study was initiated at 9:40:50 PM and ended at 3:53:56 AM.  Sleep onset time was 33.4 minutes and the sleep efficiency was 28.1%. The total sleep time was 105.0 minutes.  Stage REM latency was 318.0 minutes.  The patient spent 52.86% of the night in stage N1 sleep, 46.67% in stage N2 sleep, 0.00% in stage N3 and 0.48% in REM.  Alpha intrusion was absent.  Supine sleep was 1.35%.  RESPIRATORY PARAMETERS The overall apnea/hypopnea index (AHI) was 25.7 per hour. There were 35 total apneas, including 35 obstructive, 0 central and 0 mixed apneas. There were 10 hypopneas and 7 RERAs.  The AHI during Stage REM sleep was 0.0 per hour.  AHI while supine was 0.0 per hour.  The mean oxygen saturation was 90.73%. The minimum SpO2 during sleep was 80.00%.  moderate snoring was noted during this study.  CARDIAC DATA The 2 lead EKG demonstrated sinus rhythm. The mean  heart rate was 76.11 beats per minute. Other EKG findings include: PVCs.  LEG MOVEMENT DATA The total PLMS were 218 with a resulting PLMS index of 124.57. Associated arousal with leg movement index was 20.0 .  IMPRESSIONS - Moderate obstructive sleep apnea occurred during this study (AHI = 25.7/h). - Insufficient sleep to meet protocol requirements for split CPAP titration on this study night. - Patient had significant difficulty initiating and maintaining sleep, very restless, complaining of itiching, pulling off leads and monitors. - No significant central sleep apnea occurred during this study (CAI = 0.0/h). - Moderate oxygen desaturation was noted during this study (Min O2 = 80.00%, Mean 90.7%). - The patient snored with moderate snoring volume. - EKG findings include PVCs. - Severe periodic limb movements of sleep occurred during the study. Associated arousals were significant.  DIAGNOSIS - Obstructive Sleep Apnea (327.23 [G47.33 ICD-10]) - Nocturnal Hypoxemia (327.26 [G47.36 ICD-10]) - Periodic Limb Movement Sleep Disorder  RECOMMENDATIONS - CPAP titration or AutoPAP - Specific therapy for limb movement sleep disorder might include a trial of Requip or Mirapex. Clonazepam might be considered to address Insomnia as well as  limb movement, if appropriate. - Sleep hygiene should be reviewed to assess factors that may improve sleep quality. - Weight management and regular exercise should be initiated or continued if appropriate.  [Electronically signed] 04/14/2017 02:28 PM  Baird Lyons MD, [place], American Board of Sleep Medicine   NPI: [id number]                          [place], [place] [facility]  ELECTRONICALLY SIGNED ON:  04/14/2017, 2:21 PM [place]  PH: [phone]   FX: [phone] [place]

## 2017-04-15 ENCOUNTER — Other Ambulatory Visit (INDEPENDENT_AMBULATORY_CARE_PROVIDER_SITE_OTHER): Payer: Medicare Other

## 2017-04-15 ENCOUNTER — Other Ambulatory Visit: Payer: Self-pay

## 2017-04-15 ENCOUNTER — Encounter: Payer: Self-pay | Admitting: Internal Medicine

## 2017-04-15 ENCOUNTER — Encounter (HOSPITAL_COMMUNITY): Payer: Self-pay

## 2017-04-15 ENCOUNTER — Ambulatory Visit (INDEPENDENT_AMBULATORY_CARE_PROVIDER_SITE_OTHER): Payer: Medicare Other | Admitting: Internal Medicine

## 2017-04-15 ENCOUNTER — Inpatient Hospital Stay (HOSPITAL_COMMUNITY)
Admission: EM | Admit: 2017-04-15 | Discharge: 2017-04-18 | DRG: 378 | Disposition: A | Discharge status: Home / Self Care | Payer: Medicare Other | Attending: Internal Medicine | Admitting: Internal Medicine

## 2017-04-15 VITALS — BP 102/68 | HR 74 | Ht 69.0 in | Wt 222.6 lb

## 2017-04-15 DIAGNOSIS — I4819 Other persistent atrial fibrillation: Secondary | ICD-10-CM | POA: Diagnosis present

## 2017-04-15 DIAGNOSIS — R0609 Other forms of dyspnea: Secondary | ICD-10-CM

## 2017-04-15 DIAGNOSIS — I5032 Chronic diastolic (congestive) heart failure: Secondary | ICD-10-CM | POA: Diagnosis present

## 2017-04-15 DIAGNOSIS — Z7901 Long term (current) use of anticoagulants: Secondary | ICD-10-CM

## 2017-04-15 DIAGNOSIS — K5730 Diverticulosis of large intestine without perforation or abscess without bleeding: Secondary | ICD-10-CM | POA: Diagnosis present

## 2017-04-15 DIAGNOSIS — Z96611 Presence of right artificial shoulder joint: Secondary | ICD-10-CM | POA: Diagnosis present

## 2017-04-15 DIAGNOSIS — I110 Hypertensive heart disease with heart failure: Secondary | ICD-10-CM | POA: Diagnosis present

## 2017-04-15 DIAGNOSIS — Z79899 Other long term (current) drug therapy: Secondary | ICD-10-CM

## 2017-04-15 DIAGNOSIS — Z8673 Personal history of transient ischemic attack (TIA), and cerebral infarction without residual deficits: Secondary | ICD-10-CM

## 2017-04-15 DIAGNOSIS — R0602 Shortness of breath: Secondary | ICD-10-CM | POA: Diagnosis not present

## 2017-04-15 DIAGNOSIS — R918 Other nonspecific abnormal finding of lung field: Secondary | ICD-10-CM | POA: Diagnosis present

## 2017-04-15 DIAGNOSIS — Q2733 Arteriovenous malformation of digestive system vessel: Secondary | ICD-10-CM

## 2017-04-15 DIAGNOSIS — K31819 Angiodysplasia of stomach and duodenum without bleeding: Secondary | ICD-10-CM

## 2017-04-15 DIAGNOSIS — R0600 Dyspnea, unspecified: Secondary | ICD-10-CM | POA: Diagnosis present

## 2017-04-15 DIAGNOSIS — R911 Solitary pulmonary nodule: Secondary | ICD-10-CM | POA: Diagnosis present

## 2017-04-15 DIAGNOSIS — I481 Persistent atrial fibrillation: Secondary | ICD-10-CM | POA: Diagnosis present

## 2017-04-15 DIAGNOSIS — G4733 Obstructive sleep apnea (adult) (pediatric): Secondary | ICD-10-CM | POA: Diagnosis present

## 2017-04-15 DIAGNOSIS — K5520 Angiodysplasia of colon without hemorrhage: Secondary | ICD-10-CM

## 2017-04-15 DIAGNOSIS — K648 Other hemorrhoids: Secondary | ICD-10-CM | POA: Diagnosis present

## 2017-04-15 DIAGNOSIS — I2510 Atherosclerotic heart disease of native coronary artery without angina pectoris: Secondary | ICD-10-CM | POA: Diagnosis present

## 2017-04-15 DIAGNOSIS — D649 Anemia, unspecified: Secondary | ICD-10-CM | POA: Diagnosis present

## 2017-04-15 DIAGNOSIS — K5521 Angiodysplasia of colon with hemorrhage: Secondary | ICD-10-CM | POA: Diagnosis not present

## 2017-04-15 DIAGNOSIS — D500 Iron deficiency anemia secondary to blood loss (chronic): Secondary | ICD-10-CM

## 2017-04-15 DIAGNOSIS — K449 Diaphragmatic hernia without obstruction or gangrene: Secondary | ICD-10-CM | POA: Diagnosis present

## 2017-04-15 DIAGNOSIS — Z85038 Personal history of other malignant neoplasm of large intestine: Secondary | ICD-10-CM

## 2017-04-15 DIAGNOSIS — K31811 Angiodysplasia of stomach and duodenum with bleeding: Secondary | ICD-10-CM | POA: Diagnosis present

## 2017-04-15 DIAGNOSIS — Z87891 Personal history of nicotine dependence: Secondary | ICD-10-CM

## 2017-04-15 DIAGNOSIS — Z96651 Presence of right artificial knee joint: Secondary | ICD-10-CM | POA: Diagnosis present

## 2017-04-15 DIAGNOSIS — F329 Major depressive disorder, single episode, unspecified: Secondary | ICD-10-CM | POA: Diagnosis present

## 2017-04-15 LAB — CBC
HEMATOCRIT: 25.5 % — AB (ref 39.0–52.0)
Hemoglobin: 7 g/dL — ABNORMAL LOW (ref 13.0–17.0)
MCH: 20.3 pg — ABNORMAL LOW (ref 26.0–34.0)
MCHC: 27.5 g/dL — ABNORMAL LOW (ref 30.0–36.0)
MCV: 73.9 fL — ABNORMAL LOW (ref 78.0–100.0)
Platelets: 250 10*3/uL (ref 150–400)
RBC: 3.45 MIL/uL — ABNORMAL LOW (ref 4.22–5.81)
RDW: 18.1 % — AB (ref 11.5–15.5)
WBC: 8.3 10*3/uL (ref 4.0–10.5)

## 2017-04-15 LAB — CBC WITH DIFFERENTIAL/PLATELET
BASOS PCT: 1.3 % (ref 0.0–3.0)
Basophils Absolute: 0.1 10*3/uL (ref 0.0–0.1)
EOS ABS: 0.4 10*3/uL (ref 0.0–0.7)
EOS PCT: 4.4 % (ref 0.0–5.0)
HCT: 23.4 % — CL (ref 39.0–52.0)
Hemoglobin: 6.9 g/dL — CL (ref 13.0–17.0)
Lymphocytes Relative: 20.4 % (ref 12.0–46.0)
Lymphs Abs: 1.7 10*3/uL (ref 0.7–4.0)
MCV: 68.6 fl — ABNORMAL LOW (ref 78.0–100.0)
MONO ABS: 0.9 10*3/uL (ref 0.1–1.0)
Monocytes Relative: 10.6 % (ref 3.0–12.0)
NEUTROS ABS: 5.4 10*3/uL (ref 1.4–7.7)
Neutrophils Relative %: 63.3 % (ref 43.0–77.0)
PLATELETS: 250 10*3/uL (ref 150.0–400.0)
RBC: 3.54 Mil/uL — ABNORMAL LOW (ref 4.22–5.81)
RDW: 17.8 % — AB (ref 11.5–15.5)
WBC: 8.6 10*3/uL (ref 4.0–10.5)

## 2017-04-15 LAB — BASIC METABOLIC PANEL
ANION GAP: 11 (ref 5–15)
BUN: 18 mg/dL (ref 6–23)
BUN: 15 mg/dL (ref 6–20)
CALCIUM: 8.7 mg/dL (ref 8.4–10.5)
CO2: 29 mEq/L (ref 19–32)
CO2: 23 mmol/L (ref 22–32)
CREATININE: 0.89 mg/dL (ref 0.40–1.50)
Calcium: 8.8 mg/dL — ABNORMAL LOW (ref 8.9–10.3)
Chloride: 104 mEq/L (ref 96–112)
Chloride: 105 mmol/L (ref 101–111)
Creatinine, Ser: 0.85 mg/dL (ref 0.61–1.24)
GFR calc Af Amer: >60 mL/min (ref >60)
GFR: 88.26 mL/min (ref >60.00)
GLUCOSE: 140 mg/dL — AB (ref 70–99)
Glucose, Bld: 119 mg/dL — ABNORMAL HIGH (ref 65–99)
POTASSIUM: 3.8 mmol/L (ref 3.5–5.1)
Potassium: 4.1 mEq/L (ref 3.5–5.1)
SODIUM: 139 meq/L (ref 135–145)
Sodium: 139 mmol/L (ref 135–145)

## 2017-04-15 LAB — HEPATIC FUNCTION PANEL
ALK PHOS: 97 U/L (ref 39–117)
ALT: 9 U/L (ref 0–53)
AST: 12 U/L (ref 0–37)
Albumin: 3.8 g/dL (ref 3.5–5.2)
BILIRUBIN DIRECT: 0.1 mg/dL (ref 0.0–0.3)
BILIRUBIN TOTAL: 0.5 mg/dL (ref 0.2–1.2)
Total Protein: 6.7 g/dL (ref 6.0–8.3)

## 2017-04-15 LAB — TSH: TSH: 1.43 u[IU]/mL (ref 0.35–4.50)

## 2017-04-15 LAB — BRAIN NATRIURETIC PEPTIDE: PRO B NATRI PEPTIDE: 117 pg/mL — AB (ref 0.0–100.0)

## 2017-04-15 LAB — POC OCCULT BLOOD, ED: Fecal Occult Bld: NEGATIVE

## 2017-04-15 NOTE — Assessment & Plan Note (Signed)
Although there are clearly abnormalities on CT scan, they should probably be considered "microscopic" since not obvious on plain cxr .     In the setting of obvious "macroscopic" health issues,  I am very reluctatnt to embark on an invasive w/u at this point but will arrange consevative  follow up  In one years and in the meantime see what we can do to address the patient's subjective concerns (see doe)  Discussed in detail all the  indications, usual  risks and alternatives  relative to the benefits with patient who agrees to proceed with conservative f/u as outlined

## 2017-04-15 NOTE — Patient Instructions (Signed)
Pantoprazole should be Take 30-60 min before first meal of the day   Please remember to go to the lab department downstairs in the basement  for your tests - we will call you with the results when they are available.      Please schedule a follow up office visit in 4 weeks, sooner if needed  with all medications /inhalers/ solutions in hand so we can verify exactly what you are taking. This includes all medications from all doctors and over the counters - Repeat pfts

## 2017-04-15 NOTE — ED Notes (Signed)
Patient reported SOB onset after going to use the restroom. Patient vitals re-checked and family updated on current wait time situation. VSS.

## 2017-04-15 NOTE — Assessment & Plan Note (Addendum)
PFTs  12/16/15  FVC  3.32 (81%) no obst and DLCO 59/58c and 77% with correction for alv vol - Spirometry 01/20/2016  FVC  3.12 (74%) and exp truncation  - 01/20/2016  Walked RA x 3 laps @ 185 ft each stopped due to  End of study, nl pace, mild sob with cough at end  - HRCT CT 04/09/2017 1. Previously noted nodules are stable compared to the prior study. These are strongly favored to be benign. The nodular area of architectural distortion in the posterior aspect of the right lower lobe is most likely an area of post infectious or inflammatory scarring. Repeat noncontrast chest CT could be considered in 12 months to ensure continued stability. 2. Mild diffuse bronchial wall thickening with mild to moderate centrilobular and paraseptal emphysema. 3. Aortic atherosclerosis, in addition to left main and 3 vessel coronary artery disease   - 04/15/2017   Walked RA x one lap @ 185 stopped due sob with ok sats but Hgb 6.9 > sent to ER as on xarelto with evidence of possible fluid overload on exam   Discussed in detail all the  indications, usual  risks and alternatives  relative to the benefits with patient who agrees to proceed with w/u as outlined but needs to done as inpt given above concerns in urgent fashion.

## 2017-04-15 NOTE — Progress Notes (Signed)
Subjective:    Patient ID: George Vega, male    DOB: May 21, 1940,    MRN: [id number]  Brief patient profile:   75 yowm quit smoking 1999 slowed down by arthritis but new waking from sleeping with sob x around 11/2015 referred to pulmonary clinic 01/20/2016 by Dr  Lovena Le with abn pfts 12/16/15      History of Present Illness  01/20/2016 1st [facility] office visit/ George Vega   Chief Complaint  Patient presents with  . Pulmonary Consult    Referred by Dr. Lovena Le. Pt c/o SOB off and on for the past 2 months. He states it mainly bothers him when he lies down. He occ gets SOB after he exerts himself.    wakes up sob x 2 months once a week x 20 sec, couple dep breaths and feels better.  Also wife hears noisy breathing at rest during same time period/ pt has sense of pnds and freq clearing his throat daytime only  He's more concerned with fatigue limiting activity tol than sob  Note has h/o acei cough remotely, resolved on arb rec Omeprazole 40   Take  30-60 min before first meal of the day and Pepcid (famotidine)  20 mg one @  bedtime until return to office -did not do GERD  Diet ? followed Please remember to go to the lab Allergy profile 01/20/2016 >  Eos 0.6 /  IgE  319 with RAST Mold > dog ? Low grade ABPA ? 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 Pulmonary Consult    Referred by Fulton Mole, PA for eval of abnormal low dose CT Chest- done 10/03/16. Pt denies any respiratory co's today.   pt did not actually qualify for low dose screening Ct as > 15 years since quit but the low dose scan showed multiple bilateral nodules so referred to pulmonary clinic 10/09/2016 by Roe Coombs Not limited by breathing from desired activities   Still having nasal congestion and urge to clear throat and never took gerd trial  "it's just not that bad"  Rec We will contact you in 6 months for follow up CT chest - call sooner if new chest pain or unexplained cough      04/15/2017  f/u ov/George Vega re:  MPNs / worse doe x one month Chief Complaint  Patient presents with  . Follow-up    Pt states he is having worsening SOB mainly with exertion. Denies any cough or CP.  Dyspnea:  MMRC3 = can't walk 100 yards even at a slow pace at a flat grade s stopping due to sob   Cough: no Sleep: ok L side down / 6 in under hob > once settles down does ok  Has had increased swelling L leg and pallor over last month also / no obvious bleeding on xarelto    No obvious day to day or daytime variability or assoc excess/ purulent sputum or mucus plugs or hemoptysis or cp or chest tightness, subjective wheeze or overt sinus or hb symptoms. No unusual exposure hx or h/o childhood pna/ asthma or knowledge of premature birth.  Sleeping ok as above without nocturnal  or early am exacerbation  of respiratory  c/o's or need for noct saba. Also denies any obvious fluctuation of symptoms with weather  or environmental changes or other aggravating or alleviating factors except as outlined above   Current Allergies, Complete Past Medical History, Past Surgical History, Family History, and Social History were reviewed in [employer] record.  ROS  The following are not active complaints unless bolded Hoarseness, sore throat, dysphagia, dental problems, itching, sneezing,  nasal congestion or discharge of excess mucus or purulent secretions, ear ache,   fever, chills, sweats, unintended wt loss or wt gain, classically pleuritic or exertional cp,  orthopnea pnd or leg swelling, presyncope, palpitations, abdominal pain, anorexia, nausea, vomiting, diarrhea  or change in bowel habits or change in bladder habits, change in stools or change in urine, dysuria, hematuria,  rash, arthralgias, visual complaints, headache, numbness, weakness or  ataxia or problems with walking or coordination,  change in mood/affect or memory.        Current Meds  Medication Sig  . acetaminophen (TYLENOL) 325 MG tablet Take 650 mg by mouth every 6 (six) hours as needed for moderate pain.  Marland Kitchen amLODipine (NORVASC) 5 MG tablet TAKE 1 TABLET BY MOUTH  DAILY  . Cholecalciferol (VITAMIN D3) 5000 units TABS Take 5,000 Units by mouth daily.  . Cyanocobalamin (VITAMIN B-12) 500 MCG SUBL Place 500 mcg under the tongue 3 (three) times a week.   . dofetilide (TIKOSYN) 250 MCG capsule TAKE 1 CAPSULE BY MOUTH TWO TIMES DAILY  . furosemide (LASIX) 40 MG tablet Take 40 mg by mouth 3 (three) times daily as needed for fluid.  Marland Kitchen irbesartan (AVAPRO) 150 MG tablet TAKE 1 TABLET(150 MG) BY MOUTH DAILY  . LORazepam (ATIVAN) 0.5 MG tablet Take 1/2 to 1 tablet daily as needed for acute anxiety. Limit use. Do no mix w/ trazodone.  . magnesium oxide (MAG-OX) 400 MG tablet Take 400 mg by mouth daily.  . Melatonin 10 MG TABS Take 10 mg by mouth at bedtime.   . metoprolol tartrate (LOPRESSOR) 25 MG tablet TAKE 1 TABLET BY MOUTH TWO  TIMES DAILY  . Multiple Vitamins-Minerals (PRESERVISION AREDS 2 PO) Take 1 capsule by mouth 2 (two) times daily.  . pantoprazole (PROTONIX) 40 MG tablet TAKE 1 TABLET(40 MG) BY MOUTH DAILY  . PARoxetine (PAXIL) 30 MG tablet Take 30 mg by mouth every morning.  . polycarbophil (FIBERCON) 625 MG tablet Take 625 mg by mouth daily.    [name] Glycol-Propyl Glycol (SYSTANE OP) Place 2 drops into both eyes 2 (two) times daily.  . potassium chloride (K-DUR) 10 MEQ tablet Take 10 mEq by mouth 3 (three) times daily as needed.  . rosuvastatin (CRESTOR) 5 MG tablet Take 1 tablet (5 mg total) by mouth every morning.  . tamsulosin (FLOMAX) 0.4 MG CAPS capsule Take 0.4 mg by mouth daily.  . traZODone (DESYREL) 50 MG tablet TAKE 1/2 TO 1 TABLET BY MOUTH AS NEEDED FOR SLEEP  . XARELTO 20 MG TABS tablet TAKE 1 TABLET BY MOUTH  DAILY AFTER SUPPER                    Objective:   Physical Exam  amb wm nad/ quite pale and nasal tone to voice     04/15/2017       222  10/09/2016      222   01/20/16 218 lb 9.6 oz (99.2 kg)  12/14/15 212 lb (96.2 kg)  11/17/15 211 lb 4.8 oz (95.8 kg)    Vital signs reviewed - Note on arrival 02 sats  98% on RA  few crackles in R base/ L leg 1+ / nasal tone to voice    HEENT: nl dentition, turbinates bilaterally, and oropharynx. Nl external ear canals without cough reflex   NECK :  without JVD/Nodes/TM/ nl carotid upstrokes bilaterally   LUNGS: no acc muscle use,  Nl contour chest  With a few crackles on inspiration R > L base/ no cough   CV:  RRR  no s3 -  II/VI sSEM - no increase in P2,   -  1+ ptting on L leg/ trace only on R   ABD:  soft and nontender with nl inspiratory excursion in the supine position. No bruits or organomegaly appreciated, bowel sounds nl  MS:  Nl gait/ ext warm without deformities, calf tenderness, cyanosis or clubbing No obvious joint restrictions   SKIN: warm and dry without lesions    NEURO:  alert, approp, nl sensorium with  no motor or cerebellar deficits apparent.         I personally reviewed images and agree with radiology impression as follows:   Chest CT w/o contrast 04/09/17 1. Previously noted nodules are stable compared to the prior study. These are strongly favored to be benign. The nodular area of architectural distortion in the posterior aspect of the right lower lobe is most likely an area of post infectious or inflammatory scarring. Repeat noncontrast chest CT could be considered in 12 months to ensure continued stability. 2. Mild diffuse bronchial wall thickening with mild to moderate centrilobular and paraseptal emphysema. 3. Aortic atherosclerosis, in addition to left main and 3 vessel coronary artery disease. Assessment for potential risk factor modification, dietary therapy or pharmacologic therapy may be warranted, if clinically  indicated. 4. There are calcifications of the aortic valve and mitral annulus. Echocardiographic correlation for evaluation of potential valvular dysfunction may be warranted if clinically indicated.    Labs ordered 04/15/2017   Cbc/ bmet/ lfts/bnp    Critical call back from lab  hgb 6.9      Assessment & Plan:

## 2017-04-15 NOTE — ED Provider Notes (Signed)
Patient placed in Quick Look pathway, seen and evaluated   Chief Complaint: sob, anemia  HPI:   Patient resents to the ED from pulmonologist for concern of shortness of breath for the past month.  Concern for possible anemia causing shortness of breath.  Patient is on Xarelto due to A. fib.  Denies any associated GI bleed symptoms.  Patient also reports increased weakness and fatigue.  ROS: Positive for shortness of breath, palpitations, pallor, weakness, fatigue.  Negative for hematochezia, melena, hematuria, fevers, abdominal pain, chest pain. (one)  Physical Exam:   Gen: No distress  Neuro: Awake and Alert  Skin: Warm    Focused Exam: Pallor noted.  No focal abdominal tenderness to palpation.  Bowel sounds are normal.  No distention noted.  Chaperone present for exam. Pt tolerated without difficulty. No external hemorrhoids or fissures noted. No pain with palpation of the rectal vault. No internal hemorrhoids noted. Soft brown stool noted in the rectal vault. No gross hematochezia or melena.  Heart regular rate and rhythm.  Lungs clear to auscultation bilaterally.   Initiation of care has begun. The patient has been counseled on the process, plan, and necessity for staying for the completion/evaluation, and the remainder of the medical screening examination    Aaron Edelman 04/15/17 1912    Pattricia Boss, MD 04/16/17 [phone]

## 2017-04-15 NOTE — ED Triage Notes (Signed)
Per Pt, Pt is coming from Pulmonalogist with complaints of SOB that has been progressive for the last month. Denies chest pain or bleeding in bowels or urine. Alert and oriented x4. Increased weakness and fatigue.

## 2017-04-16 ENCOUNTER — Telehealth: Payer: Self-pay | Admitting: Internal Medicine

## 2017-04-16 ENCOUNTER — Other Ambulatory Visit: Payer: Self-pay

## 2017-04-16 ENCOUNTER — Emergency Department (HOSPITAL_COMMUNITY): Payer: Medicare Other

## 2017-04-16 ENCOUNTER — Encounter (HOSPITAL_COMMUNITY): Payer: Self-pay | Admitting: Internal Medicine

## 2017-04-16 DIAGNOSIS — K31811 Angiodysplasia of stomach and duodenum with bleeding: Secondary | ICD-10-CM | POA: Diagnosis present

## 2017-04-16 DIAGNOSIS — K5730 Diverticulosis of large intestine without perforation or abscess without bleeding: Secondary | ICD-10-CM | POA: Diagnosis present

## 2017-04-16 DIAGNOSIS — Z87891 Personal history of nicotine dependence: Secondary | ICD-10-CM | POA: Diagnosis not present

## 2017-04-16 DIAGNOSIS — G4733 Obstructive sleep apnea (adult) (pediatric): Secondary | ICD-10-CM | POA: Diagnosis present

## 2017-04-16 DIAGNOSIS — Q2733 Arteriovenous malformation of digestive system vessel: Secondary | ICD-10-CM | POA: Diagnosis not present

## 2017-04-16 DIAGNOSIS — I5032 Chronic diastolic (congestive) heart failure: Secondary | ICD-10-CM | POA: Diagnosis present

## 2017-04-16 DIAGNOSIS — K259 Gastric ulcer, unspecified as acute or chronic, without hemorrhage or perforation: Secondary | ICD-10-CM | POA: Diagnosis not present

## 2017-04-16 DIAGNOSIS — K449 Diaphragmatic hernia without obstruction or gangrene: Secondary | ICD-10-CM | POA: Diagnosis present

## 2017-04-16 DIAGNOSIS — Z85038 Personal history of other malignant neoplasm of large intestine: Secondary | ICD-10-CM | POA: Diagnosis not present

## 2017-04-16 DIAGNOSIS — D500 Iron deficiency anemia secondary to blood loss (chronic): Secondary | ICD-10-CM | POA: Diagnosis present

## 2017-04-16 DIAGNOSIS — D649 Anemia, unspecified: Secondary | ICD-10-CM | POA: Diagnosis not present

## 2017-04-16 DIAGNOSIS — F329 Major depressive disorder, single episode, unspecified: Secondary | ICD-10-CM | POA: Diagnosis present

## 2017-04-16 DIAGNOSIS — Z96651 Presence of right artificial knee joint: Secondary | ICD-10-CM | POA: Diagnosis present

## 2017-04-16 DIAGNOSIS — I10 Essential (primary) hypertension: Secondary | ICD-10-CM | POA: Diagnosis not present

## 2017-04-16 DIAGNOSIS — K648 Other hemorrhoids: Secondary | ICD-10-CM | POA: Diagnosis present

## 2017-04-16 DIAGNOSIS — Z7901 Long term (current) use of anticoagulants: Secondary | ICD-10-CM

## 2017-04-16 DIAGNOSIS — I2510 Atherosclerotic heart disease of native coronary artery without angina pectoris: Secondary | ICD-10-CM | POA: Diagnosis present

## 2017-04-16 DIAGNOSIS — G43909 Migraine, unspecified, not intractable, without status migrainosus: Secondary | ICD-10-CM | POA: Diagnosis not present

## 2017-04-16 DIAGNOSIS — R0602 Shortness of breath: Secondary | ICD-10-CM | POA: Diagnosis present

## 2017-04-16 DIAGNOSIS — K5520 Angiodysplasia of colon without hemorrhage: Secondary | ICD-10-CM | POA: Diagnosis not present

## 2017-04-16 DIAGNOSIS — Z8673 Personal history of transient ischemic attack (TIA), and cerebral infarction without residual deficits: Secondary | ICD-10-CM | POA: Diagnosis not present

## 2017-04-16 DIAGNOSIS — D509 Iron deficiency anemia, unspecified: Secondary | ICD-10-CM | POA: Diagnosis not present

## 2017-04-16 DIAGNOSIS — K31819 Angiodysplasia of stomach and duodenum without bleeding: Secondary | ICD-10-CM | POA: Diagnosis not present

## 2017-04-16 DIAGNOSIS — I481 Persistent atrial fibrillation: Secondary | ICD-10-CM | POA: Diagnosis present

## 2017-04-16 DIAGNOSIS — Z8711 Personal history of peptic ulcer disease: Secondary | ICD-10-CM | POA: Diagnosis not present

## 2017-04-16 DIAGNOSIS — R0609 Other forms of dyspnea: Secondary | ICD-10-CM | POA: Diagnosis not present

## 2017-04-16 DIAGNOSIS — R911 Solitary pulmonary nodule: Secondary | ICD-10-CM | POA: Diagnosis present

## 2017-04-16 DIAGNOSIS — I110 Hypertensive heart disease with heart failure: Secondary | ICD-10-CM | POA: Diagnosis present

## 2017-04-16 DIAGNOSIS — I340 Nonrheumatic mitral (valve) insufficiency: Secondary | ICD-10-CM | POA: Diagnosis not present

## 2017-04-16 DIAGNOSIS — Z9889 Other specified postprocedural states: Secondary | ICD-10-CM | POA: Diagnosis not present

## 2017-04-16 DIAGNOSIS — I482 Chronic atrial fibrillation: Secondary | ICD-10-CM | POA: Diagnosis not present

## 2017-04-16 DIAGNOSIS — Z96611 Presence of right artificial shoulder joint: Secondary | ICD-10-CM | POA: Diagnosis present

## 2017-04-16 DIAGNOSIS — Z79899 Other long term (current) drug therapy: Secondary | ICD-10-CM | POA: Diagnosis not present

## 2017-04-16 DIAGNOSIS — Z862 Personal history of diseases of the blood and blood-forming organs and certain disorders involving the immune mechanism: Secondary | ICD-10-CM | POA: Diagnosis not present

## 2017-04-16 DIAGNOSIS — R918 Other nonspecific abnormal finding of lung field: Secondary | ICD-10-CM | POA: Diagnosis not present

## 2017-04-16 DIAGNOSIS — K5521 Angiodysplasia of colon with hemorrhage: Secondary | ICD-10-CM | POA: Diagnosis present

## 2017-04-16 LAB — PREPARE RBC (CROSSMATCH)

## 2017-04-16 LAB — CBC
HCT: 31.6 % — ABNORMAL LOW (ref 39.0–52.0)
HEMOGLOBIN: 9 g/dL — AB (ref 13.0–17.0)
MCH: 21.7 pg — ABNORMAL LOW (ref 26.0–34.0)
MCHC: 28.5 g/dL — AB (ref 30.0–36.0)
MCV: 76.1 fL — ABNORMAL LOW (ref 78.0–100.0)
PLATELETS: 232 10*3/uL (ref 150–400)
RBC: 4.15 MIL/uL — ABNORMAL LOW (ref 4.22–5.81)
RDW: 19 % — AB (ref 11.5–15.5)
WBC: 9.4 10*3/uL (ref 4.0–10.5)

## 2017-04-16 LAB — URINALYSIS, ROUTINE W REFLEX MICROSCOPIC
Bilirubin Urine: NEGATIVE
Glucose, UA: NEGATIVE mg/dL
HGB URINE DIPSTICK: NEGATIVE
Ketones, ur: NEGATIVE mg/dL
LEUKOCYTES UA: NEGATIVE
NITRITE: NEGATIVE
PROTEIN: NEGATIVE mg/dL
Specific Gravity, Urine: 1.023 (ref 1.005–1.030)
pH: 5 (ref 5.0–8.0)

## 2017-04-16 LAB — PROTIME-INR
INR: 1.35
PROTHROMBIN TIME: 16.5 s — AB (ref 11.4–15.2)

## 2017-04-16 LAB — APTT
APTT: 37 s — AB (ref 24–36)
APTT: 65 s — AB (ref 24–36)

## 2017-04-16 LAB — HEPARIN LEVEL (UNFRACTIONATED): HEPARIN UNFRACTIONATED: 1.46 [IU]/mL — AB (ref 0.30–0.70)

## 2017-04-16 MED ORDER — TRAZODONE HCL 50 MG PO TABS
50.0000 mg | ORAL_TABLET | Freq: Once | ORAL | Status: DC
  Filled 2017-04-16: qty 1

## 2017-04-16 MED ORDER — METOPROLOL TARTRATE 25 MG PO TABS
25.0000 mg | ORAL_TABLET | Freq: Two times a day (BID) | ORAL | Status: DC
  Administered 2017-04-16 – 2017-04-18 (×5): 25 mg via ORAL
  Filled 2017-04-16 (×5): qty 1

## 2017-04-16 MED ORDER — PEG-KCL-NACL-NASULF-NA ASC-C 100 G PO SOLR
0.5000 | Freq: Once | ORAL | Status: AC
  Administered 2017-04-16: 100 g via ORAL
  Filled 2017-04-16: qty 1

## 2017-04-16 MED ORDER — HEPARIN (PORCINE) IN NACL 100-0.45 UNIT/ML-% IJ SOLN
1350.0000 [IU]/h | INTRAMUSCULAR | Status: DC
  Administered 2017-04-16: 1350 [IU]/h via INTRAVENOUS
  Filled 2017-04-16: qty 250

## 2017-04-16 MED ORDER — PEG-KCL-NACL-NASULF-NA ASC-C 100 G PO SOLR
0.5000 | Freq: Once | ORAL | Status: AC
  Administered 2017-04-17: 0.5 via ORAL

## 2017-04-16 MED ORDER — PEG-KCL-NACL-NASULF-NA ASC-C 100 G PO SOLR: 1.0000 | Freq: Once | ORAL | Status: DC

## 2017-04-16 MED ORDER — SODIUM CHLORIDE 0.9 % IV SOLN
Freq: Once | INTRAVENOUS | Status: AC
  Administered 2017-04-16: 07:00:00 via INTRAVENOUS

## 2017-04-16 MED ORDER — VITAMIN D 1000 UNITS PO TABS
5000.0000 [IU] | ORAL_TABLET | Freq: Every day | ORAL | Status: DC
  Administered 2017-04-16 – 2017-04-17 (×2): 5000 [IU] via ORAL
  Filled 2017-04-16 (×2): qty 5

## 2017-04-16 MED ORDER — MELATONIN 3 MG PO TABS
3.0000 mg | ORAL_TABLET | Freq: Every evening | ORAL | Status: DC | PRN
  Filled 2017-04-16: qty 1

## 2017-04-16 MED ORDER — MAGNESIUM OXIDE 400 (241.3 MG) MG PO TABS
400.0000 mg | ORAL_TABLET | Freq: Every day | ORAL | Status: DC
  Administered 2017-04-16 – 2017-04-18 (×3): 400 mg via ORAL
  Filled 2017-04-16 (×3): qty 1

## 2017-04-16 MED ORDER — IOPAMIDOL (ISOVUE-300) INJECTION 61%
INTRAVENOUS | Status: AC
  Administered 2017-04-16: 100 mL
  Filled 2017-04-16: qty 100

## 2017-04-16 MED ORDER — IRBESARTAN 75 MG PO TABS
150.0000 mg | ORAL_TABLET | Freq: Every day | ORAL | Status: DC
  Administered 2017-04-16 – 2017-04-18 (×3): 150 mg via ORAL
  Filled 2017-04-16: qty 2
  Filled 2017-04-16: qty 0.5
  Filled 2017-04-16: qty 2
  Filled 2017-04-16: qty 1

## 2017-04-16 MED ORDER — PAROXETINE HCL 20 MG PO TABS
40.0000 mg | ORAL_TABLET | Freq: Every day | ORAL | Status: DC
  Administered 2017-04-16 – 2017-04-17 (×2): 40 mg via ORAL
  Filled 2017-04-16 (×3): qty 2

## 2017-04-16 MED ORDER — ACETAMINOPHEN 650 MG RE SUPP: 650.0000 mg | Freq: Four times a day (QID) | RECTAL | Status: DC | PRN

## 2017-04-16 MED ORDER — ONDANSETRON HCL 4 MG/2ML IJ SOLN: 4.0000 mg | Freq: Four times a day (QID) | INTRAMUSCULAR | Status: DC | PRN

## 2017-04-16 MED ORDER — DOFETILIDE 250 MCG PO CAPS
250.0000 ug | ORAL_CAPSULE | Freq: Two times a day (BID) | ORAL | Status: DC
  Administered 2017-04-16 – 2017-04-18 (×5): 250 ug via ORAL
  Filled 2017-04-16 (×6): qty 1

## 2017-04-16 MED ORDER — TAMSULOSIN HCL 0.4 MG PO CAPS
0.4000 mg | ORAL_CAPSULE | Freq: Every day | ORAL | Status: DC
  Administered 2017-04-16 – 2017-04-18 (×3): 0.4 mg via ORAL
  Filled 2017-04-16 (×3): qty 1

## 2017-04-16 MED ORDER — TRAZODONE HCL 50 MG PO TABS
50.0000 mg | ORAL_TABLET | Freq: Every evening | ORAL | Status: DC | PRN
  Administered 2017-04-16 – 2017-04-17 (×3): 50 mg via ORAL
  Filled 2017-04-16 (×2): qty 1

## 2017-04-16 MED ORDER — ROSUVASTATIN CALCIUM 10 MG PO TABS
5.0000 mg | ORAL_TABLET | Freq: Every morning | ORAL | Status: DC
  Administered 2017-04-16 – 2017-04-18 (×3): 5 mg via ORAL
  Filled 2017-04-16 (×3): qty 1

## 2017-04-16 MED ORDER — PANTOPRAZOLE SODIUM 40 MG PO TBEC
40.0000 mg | DELAYED_RELEASE_TABLET | Freq: Every day | ORAL | Status: DC
  Administered 2017-04-16 – 2017-04-18 (×3): 40 mg via ORAL
  Filled 2017-04-16 (×3): qty 1

## 2017-04-16 MED ORDER — HEPARIN (PORCINE) IN NACL 100-0.45 UNIT/ML-% IJ SOLN
1300.0000 [IU]/h | INTRAMUSCULAR | Status: DC
  Administered 2017-04-16: 1550 [IU]/h via INTRAVENOUS
  Filled 2017-04-16: qty 250

## 2017-04-16 MED ORDER — AMLODIPINE BESYLATE 5 MG PO TABS
5.0000 mg | ORAL_TABLET | Freq: Every day | ORAL | Status: DC
  Administered 2017-04-16 – 2017-04-18 (×3): 5 mg via ORAL
  Filled 2017-04-16 (×3): qty 1

## 2017-04-16 MED ORDER — CALCIUM POLYCARBOPHIL 625 MG PO TABS
1250.0000 mg | ORAL_TABLET | Freq: Every day | ORAL | Status: DC
Start: 2017-04-16 — End: 2017-04-18
  Administered 2017-04-17: 1250 mg via ORAL
  Filled 2017-04-16 (×3): qty 2

## 2017-04-16 MED ORDER — ONDANSETRON HCL 4 MG PO TABS: 4.0000 mg | ORAL_TABLET | Freq: Four times a day (QID) | ORAL | Status: DC | PRN

## 2017-04-16 MED ORDER — ACETAMINOPHEN 325 MG PO TABS: 650.0000 mg | ORAL_TABLET | Freq: Four times a day (QID) | ORAL | Status: DC | PRN

## 2017-04-16 MED ORDER — LORAZEPAM 0.5 MG PO TABS: 0.2500 mg | ORAL_TABLET | Freq: Every day | ORAL | Status: DC | PRN

## 2017-04-16 MED ORDER — FUROSEMIDE 10 MG/ML IJ SOLN
20.0000 mg | Freq: Once | INTRAMUSCULAR | Status: AC
  Administered 2017-04-16: 20 mg via INTRAVENOUS
  Filled 2017-04-16: qty 2

## 2017-04-16 MED ORDER — POTASSIUM CHLORIDE CRYS ER 20 MEQ PO TBCR
10.0000 meq | EXTENDED_RELEASE_TABLET | Freq: Once | ORAL | Status: AC
  Administered 2017-04-16: 10 meq via ORAL
  Filled 2017-04-16: qty 1

## 2017-04-16 NOTE — ED Notes (Signed)
White stickers used for blood work.

## 2017-04-16 NOTE — ED Notes (Signed)
ED Provider at bedside. 

## 2017-04-16 NOTE — Progress Notes (Signed)
PROGRESS NOTE    Patient: George Vega     PCP: [name], MD                    DOB: 1940-12-07            DOA: 04/15/2017 [id number]:[id number]             DOS: 04/16/2017, 11:33 AM   Date of Service: the patient was seen and examined on 04/16/2017 Subjective:   Patient was seen and examined this morning, stable no acute distress.  In a pleasant mood. Denies any active rectal bleed.  No bowel movement yet. Status post transfusion with 2 units of packed red blood cell.  He tolerated well.  ----------------------------------------------------------------------------------------------------------------------  Brief Narrative:   George Vega is a 77 y.o. male with history of atrial fibrillation, CAD, hypertension, stroke was referred to the ER after patient's hemoglobin was found to be low at around 7.  Patient has been having chronic exertional shortness of breath and has been following with cardiology and pulmonary.  Blood work done showed low hemoglobin and patient had hemoglobin around 14 last year January 2018.  Patient denies noticing any GI bleed or black stools.  Denies using any NSAIDs.  Patient is on Xarelto  hemoglobin was found to be around 7 and 2 units of PRBC transfusion was ordered since patient was symptomatic.  Chest x-ray did not show anything acute.  CT of the abdomen and pelvis was done to rule out any retroperitoneal bleed which did not show anything acute.  Stool for occult blood was negative.  Patient states he gets colonoscopy every 3 years.   Assessment & Plan:     Symptomatic anemia microcytic hypochromic -currently stable, status post transfusion with 2 units of packed red blood cell, pending repeat H&H Gastroenterology was consulted, patient was seen and evaluated thoroughly,  Did refer the patient back to Dr. Earlean Shawl for any necessary GI workup including a repeat EGD and colonoscopy and capsule endoscopy as an outpatient.  If H&H stable,  we will resume  Xarelto discharge the patient home.   May eventually need to get an anemia panel later as outpatient.  Patient may F/up with  Dr. Beryle Beams, hematologist   Dyspnea on exertion anemia likely contributing.  Patient also has shortness of breath on lying flat.  I have ordered 1 dose of Lasix.  Persistent atrial fibrillation on Tikosyn and Xarelto.  Since patient has low hemoglobin and may need procedures, holding Xarelto and On heparin foe now  Hypertension on Cozaar Norvasc and metoprolol.  History of CAD on metoprolol and statins and anticoagulations.  Pulmonary nodule being followed by pulmonologist.   DVT prophylaxis: Heparin. Code Status: Full code. Family Communication: Patient's wife and daughter. Disposition Plan: Home. In AM   Consults called: GI   Procedures:   Per GI  Antimicrobials:  Anti-infectives (From admission, onward)   None       Objective: Vitals:   04/16/17 1030 04/16/17 1045 04/16/17 1100 04/16/17 1115  BP: (!) 123/108 129/74 (!) 144/67 123/71  Pulse: 83 80 84 77  Resp: (!) 24 (!) 22 (!) 25 (!) 23  Temp:      TempSrc:      SpO2: 96% 95% 94% 91%  Weight:      Height:        Intake/Output Summary (Last 24 hours) at 04/16/2017 1133 Last data filed at 04/16/2017 1023 Gross per 24 hour  Intake 670 ml  Output 1050 ml  Net -380 ml   Filed Weights   04/15/17 1853  Weight: 100.7 kg (222 lb)    Examination:  General exam: Appears calm and comfortable  Respiratory system: Clear to auscultation. Respiratory effort normal. Cardiovascular system: S1 & S2 heard, RRR. No JVD, murmurs, rubs, gallops or clicks. No pedal edema. Gastrointestinal system: Abdomen is nondistended, soft and nontender. No organomegaly or masses felt. Normal bowel sounds heard. Central nervous system: Alert and oriented. No focal neurological deficits. Extremities: Symmetric 5 x 5 power. Skin: No rashes, lesions or ulcers Psychiatry: Judgement and insight appear normal.  Mood & affect appropriate.     Data Reviewed: I have personally reviewed following labs and imaging studies  CBC: Recent Labs  Lab 04/15/17 1636 04/15/17 1920  WBC 8.6 8.3  NEUTROABS 5.4  --   HGB 6.9 Repeated and verified X2.* 7.0*  HCT 23.4 Repeated and verified X2.* 25.5*  MCV 68.6 aL* 73.9*  PLT 250.0 588   Basic Metabolic Panel: Recent Labs  Lab 04/15/17 1636 04/15/17 1920  NA 139 139  K 4.1 3.8  CL 104 105  CO2 29 23  GLUCOSE 140* 119*  BUN 18 15  CREATININE 0.89 0.85  CALCIUM 8.7 8.8*   GFR: Estimated Creatinine Clearance: 86.5 mL/min (by C-G formula based on SCr of 0.85 mg/dL). Liver Function Tests: Recent Labs  Lab 04/15/17 1636  AST 12  ALT 9  ALKPHOS 97  BILITOT 0.5  PROT 6.7  ALBUMIN 3.8   No results for input(s): LIPASE, AMYLASE in the last 168 hours. No results for input(s): AMMONIA in the last 168 hours. Coagulation Profile: Recent Labs  Lab 04/16/17 0605  INR 1.35   Cardiac Enzymes: No results for input(s): CKTOTAL, CKMB, CKMBINDEX, TROPONINI in the last 168 hours. BNP (last 3 results) Recent Labs    01/07/17 1517 04/15/17 1636  PROBNP 309 117.0*   HbA1C: No results for input(s): HGBA1C in the last 72 hours. CBG: No results for input(s): GLUCAP in the last 168 hours. Lipid Profile: No results for input(s): CHOL, HDL, LDLCALC, TRIG, CHOLHDL, LDLDIRECT in the last 72 hours. Thyroid Function Tests: Recent Labs    04/15/17 1636  TSH 1.43   Anemia Panel: No results for input(s): VITAMINB12, FOLATE, FERRITIN, TIBC, IRON, RETICCTPCT in the last 72 hours. Sepsis Labs: No results for input(s): PROCALCITON, LATICACIDVEN in the last 168 hours.  No results found for this or any previous visit (from the past 240 hour(s)).     Radiology Studies: Dg Chest 2 View  Result Date: 04/16/2017 CLINICAL DATA:  Chronic progressive shortness of breath. Generalized weakness and fatigue. EXAM: CHEST  2 VIEW COMPARISON:  CT of the chest  performed 04/09/2017 FINDINGS: The lungs are well-aerated. Mild peribronchial thickening is noted. Chronic lung changes are again seen. There is no evidence of focal opacification, pleural effusion or pneumothorax. The heart is borderline enlarged. No acute osseous abnormalities are seen. Bilateral hip arthroplasties are incompletely imaged, but appear grossly unremarkable. IMPRESSION: Mild peribronchial thickening noted. Chronic lung changes again seen. No superimposed focal airspace consolidation identified. Electronically Signed   By: Garald Balding M.D.   On: 04/16/2017 01:47   Ct Abdomen Pelvis W Contrast  Result Date: 04/16/2017 CLINICAL DATA:  Acute onset of shortness of breath. Generalized weakness and fatigue. EXAM: CT ABDOMEN AND PELVIS WITH CONTRAST TECHNIQUE: Multidetector CT imaging of the abdomen and pelvis was performed using the standard protocol following bolus administration of intravenous contrast. CONTRAST:  167mL ISOVUE-300  IOPAMIDOL (ISOVUE-300) INJECTION 61% COMPARISON:  Retroperitoneal ultrasound performed 09/23/2008 FINDINGS: Lower chest: Scattered coronary artery calcifications are seen. Scattered peripheral blebs are noted at the lung bases, with associated scarring. Hepatobiliary: The liver is unremarkable in appearance. A stone is seen within the gallbladder. The gallbladder is otherwise unremarkable. The common bile duct remains normal in caliber. Pancreas: The pancreas is within normal limits. Spleen: The spleen is unremarkable in appearance. Adrenals/Urinary Tract: The adrenal glands are unremarkable in appearance. Mild nonspecific perinephric stranding is noted bilaterally. A nonobstructing 5 mm stone is noted at the upper pole of the left kidney. There no evidence of hydronephrosis. No obstructing ureteral stones are identified. Stomach/Bowel: The stomach is unremarkable in appearance. The small bowel is within normal limits. The appendix is not visualized; there is no evidence  for appendicitis. Scattered diverticulosis is noted along the descending and sigmoid colon, without evidence of diverticulitis. Vascular/Lymphatic: Diffuse calcification is seen along the abdominal aorta and its branches. The abdominal aorta is otherwise grossly unremarkable. The inferior vena cava is grossly unremarkable. No retroperitoneal lymphadenopathy is seen. No pelvic sidewall lymphadenopathy is identified. Reproductive: The bladder is mildly distended and grossly unremarkable. The prostate remains normal in size. Other: No additional soft tissue abnormalities are seen. Musculoskeletal: No acute osseous abnormalities are identified. There is grade 1 anterolisthesis of L4 on L5, reflecting underlying facet disease. Associated vacuum phenomenon is noted. The visualized musculature is unremarkable in appearance. IMPRESSION: 1. No acute abnormality seen to explain the patient's symptoms. 2. Cholelithiasis.  Gallbladder otherwise unremarkable. 3. Nonobstructing 5 mm stone at the upper pole of the left kidney. 4. Scattered diverticulosis along the descending and sigmoid colon, without evidence of diverticulitis. 5. Scattered peripheral blebs at the lung bases, with associated scarring. 6. Scattered coronary artery calcifications seen. Aortic Atherosclerosis (ICD10-I70.0). Electronically Signed   By: Garald Balding M.D.   On: 04/16/2017 02:09    Scheduled Meds: . amLODipine  5 mg Oral Daily  . cholecalciferol  5,000 Units Oral Daily  . dofetilide  250 mcg Oral BID  . irbesartan  150 mg Oral Daily  . magnesium oxide  400 mg Oral Daily  . metoprolol tartrate  25 mg Oral BID  . pantoprazole  40 mg Oral Daily  . PARoxetine  40 mg Oral QHS  . polycarbophil  1,250 mg Oral QHS  . rosuvastatin  5 mg Oral q morning - 10a  . tamsulosin  0.4 mg Oral Daily   Continuous Infusions: . heparin 1,350 Units/hr (04/16/17 1012)     LOS: 0 days    Time spent: >25 minutes   Deatra James, MD [facility] Pager [phone]  If 7PM-7AM, please contact night-coverage [email or web address] Password [facility] 04/16/2017, 11:33 AM

## 2017-04-16 NOTE — Progress Notes (Signed)
ANTICOAGULATION CONSULT NOTE - Initial Consult  Pharmacy Consult for Heparin Indication: atrial fibrillation  Allergies  Allergen Reactions  . Tetanus Toxoid Anaphylaxis, Hives and Other (See Comments)    REACTION: hives/throat swells shut  . Fish Oil Other (See Comments)    Stomach cramps, can't eat   . Glucosamine-Chondroitin Other (See Comments)    Stomach cramps, can't eat  . Ibuprofen Nausea Only and Other (See Comments)    Stomach pains  . Naproxen Diarrhea    Patient Measurements: Height: 5\' 9"  (175.3 cm) Weight: 222 lb (100.7 kg) IBW/kg (Calculated) : 70.7 Heparin Dosing Weight: 90 kg  Vital Signs: Temp: 97.7 F (36.5 C) (02/05 0917) Temp Source: Oral (02/05 0917) BP: 135/82 (02/05 1500) Pulse Rate: 74 (02/05 1500)  Labs: Recent Labs    04/15/17 1636 04/15/17 1920 04/16/17 0605 04/16/17 1636  HGB 6.9 Repeated and verified X2.* 7.0*  --  9.0*  HCT 23.4 Repeated and verified X2.* 25.5*  --  31.6*  PLT 250.0 250  --  232  APTT  --   --  37* 65*  LABPROT  --   --  16.5*  --   INR  --   --  1.35  --   HEPARINUNFRC  --   --  1.46*  --   CREATININE 0.89 0.85  --   --     Estimated Creatinine Clearance: 86.5 mL/min (by C-G formula based on SCr of 0.85 mg/dL).     Medications:  . heparin        Assessment: 77 y.o. male admitted with SOB, anemia, h/o Afib and Xarelto on hold, for heparin.  Last dose of Xarelto PM 2/3.   Initial PTT just slightly below goal at 65.  No overt bleeding per GI notes.  Getting PRBCs.  Goal of Therapy:  APTT 66-102 Heparin level 0.3-0.7 Monitor platelets by anticoagulation protocol: Yes   Plan:  Increase IV heparin to 1550 units/hr. Check aPTT in 8 hours.   Uvaldo Rising, BCPS  Clinical Pharmacist Pager [phone]  04/16/2017 5:24 PM

## 2017-04-16 NOTE — ED Notes (Signed)
2 units ready

## 2017-04-16 NOTE — ED Provider Notes (Signed)
[facility]ERG[facility] DEPARTMENT Provider Note   CSN: [id number] Arrival date & time: 04/15/17  1819     History   Chief Complaint Chief Complaint  Patient presents with  . Shortness of Breath    HPI George Vega is a 77 y.o. male w PMHx afib, CAD, CVA, HTN, HLD, presenting to ED with 1 month of worsening dyspnea, worse on exertion. Pt seen by his Pulmonologist 04/15/17. Pt sent to ED for evaluation of anemia with hgb in clinic of 6.9, as well as concern for possible fluid overload given SOB. Pt reports some increased LE edema, fatigue, lightheadedness upon standing. No hx of anemia in the past. Denies cough, abdominal pain, melena, hematochezia, hematuria, HA, CP, recent falls. Does take xarelto for afib with hx of RVR. Per chart review, cardiac ECHO done 03/2016, without evidence of heart failure.  The history is provided by the patient.    Past Medical History:  Diagnosis Date  . Arthritis   . Atrial fibrillation, persistent ([place])   . Chronic sinusitis   . Coronary artery disease   . Depression   . Difficult intubation    was told with shoulder done 2006-alittle narrow  . 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 Hypertension   . Jejunostomy tube fell out   . 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 Spastic colon   . Stroke (cerebrum) ([place])   . SVT (supraventricular tachycardia) [facility])     Patient Active Problem List   Diagnosis Date Noted  . 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 Prediabetes 01/30/2017  . Vitamin D deficiency 01/30/2017  . Multiple pulmonary nodules determined by computed tomography of lung 10/09/2016  . Pulmonary nodules 10/09/2016  . Persistent atrial fibrillation ([place]) 03/19/2016  . Upper airway cough syndrome 01/21/2016  . Dyspnea on exertion 01/20/2016  . SVT (supraventricular tachycardia) ([place]) 11/01/2015  . Atrial tachycardia ([place]) 09/08/2015  . Occlusion and  stenosis of vertebral artery 05/28/2014  . 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 BPH (benign prostatic hyperplasia) 05/19/2014  . CVA (cerebral vascular accident) ([place]) 02/01/2014  . Benign paroxysmal positional vertigo 01/12/2014  . Headache 01/12/2014  . DTs (delirium tremens) ([place]) 09/26/2013  . 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 Ataxia 04/02/2013  . Leukocytosis 04/02/2013  . CAD (coronary artery disease) 04/02/2013  . Hyperlipemia 04/02/2013  . HTN (hypertension) 04/02/2013  . Abnormal MRA, brain 11/06/2012  . Arthralgia 09/02/2012  . Synovitis of wrist 07/25/2012  . OA (osteoarthritis) 07/25/2012  . Spastic colon 05/03/2011  . Arthritis 05/03/2011  . Coronary atherosclerosis 11/04/2008  . DEPRESSION 11/03/2008  . DYSTHYMIC DISORDER 10/13/2008  . STRESS ELECTROCARDIOGRAM, ABNORMAL 10/13/2008    Past Surgical History:  Procedure Laterality Date  . ESOPHAGEAL DILATION  2017  . FOOT NEUROMA SURGERY  2002  . LEFT HEART CATHETERIZATION WITH CORONARY ANGIOGRAM Right 06/14/2011   20% LM, chronic occluded mid LAD, 50% ostial LCX, 20% mid RI, RCA with collaterals to mid LAD, mid 10% stenosis, EF 60% 06/14/11  . NASAL SINUS SURGERY    . SHOULDER ARTHROSCOPY  03/01/2011   Procedure: ARTHROSCOPY SHOULDER;  Surgeon: Ninetta Lights, MD;  Location: [facility];  Service: Orthopedics;  Laterality: Right;  arthroscopy shoulder decompression subacromial partial acromioplasty with coracoacromial release, distal claviculectomy, debridement of labrium  . SHOULDER ARTHROSCOPY  03/01/2011   Procedure: ARTHROSCOPY SHOULDER;  Surgeon: Ninetta Lights, MD;  Location: [facility];  Service: Orthopedics;  Laterality: Right;  arthroscopy shoulder decompression subacromial partial acromioplasty with coracoacromial release, distal claviculectomy, debridement of labrium  . TEE WITHOUT CARDIOVERSION N/A 03/19/2016   Procedure: TRANSESOPHAGEAL ECHOCARDIOGRAM (TEE);  Surgeon:  Larey Dresser, MD;  Location: [facility];  Service: Cardiovascular;  Laterality: N/A;  . TOTAL KNEE ARTHROPLASTY Right 09/23/2013   Procedure: TOTAL KNEE ARTHROPLASTY;  Surgeon: Ninetta Lights, MD;  Location: [facility];  Service: Orthopedics;  Laterality: Right;  . TOTAL SHOULDER ARTHROPLASTY  06/28/2011   Procedure: TOTAL SHOULDER ARTHROPLASTY;  Surgeon: Ninetta Lights, MD;  Location: [facility];  Service: Orthopedics;  Laterality: Right;  . UVULOPALATOPHARYNGOPLASTY         Home Medications    Prior to Admission medications   Medication Sig Start Date End Date Taking? Authorizing Provider  acetaminophen (TYLENOL) 325 MG tablet Take 650 mg by mouth every 6 (six) hours as needed for moderate pain.   Yes [provider]  amLODipine (NORVASC) 5 MG tablet TAKE 1 TABLET BY MOUTH  DAILY 02/14/17  Yes Evans Lance, MD  Cholecalciferol (VITAMIN D3) 5000 units TABS Take 5,000 Units by mouth daily.   Yes [provider]  Cyanocobalamin (VITAMIN B-12) 500 MCG SUBL Place 500 mcg under the tongue 3 (three) times a week.    Yes [provider]  dofetilide (TIKOSYN) 250 MCG capsule TAKE 1 CAPSULE BY MOUTH TWO TIMES DAILY 02/14/17  Yes Evans Lance, MD  furosemide (LASIX) 40 MG tablet Take 1 tablet (40 mg total) by mouth as directed. Take 1 tablet daily for 3 days then take only as needed Patient taking differently: Take 40 mg by mouth daily as needed for fluid.  01/07/17 04/16/17 Yes Bhagat, Bhavinkumar, PA  irbesartan (AVAPRO) 150 MG tablet TAKE 1 TABLET(150 MG) BY MOUTH DAILY 12/31/16  Yes Evans Lance, MD  LORazepam (ATIVAN) 0.5 MG tablet Take 1/2 to 1 tablet daily as needed for acute anxiety. Limit use. Do no mix w/ trazodone. 02/26/17 02/27/20 Yes [provider]  magnesium oxide (MAG-OX) 400 MG tablet Take 400 mg by mouth daily.   Yes [provider]  Melatonin 10 MG TABS Take 10 mg by mouth at bedtime as needed (sleep).    Yes  [provider]  metoprolol tartrate (LOPRESSOR) 25 MG tablet TAKE 1 TABLET BY MOUTH TWO  TIMES DAILY 02/14/17  Yes Evans Lance, MD  Multiple Vitamins-Minerals (PRESERVISION AREDS 2 PO) Take 1 capsule by mouth 2 (two) times daily.   Yes [provider]  pantoprazole (PROTONIX) 40 MG tablet TAKE 1 TABLET(40 MG) BY MOUTH DAILY 02/22/17  Yes [provider]  PARoxetine (PAXIL) 30 MG tablet Take 40 mg by mouth at bedtime.  01/25/17  Yes [provider]  polycarbophil (FIBERCON) 625 MG tablet Take 1,250 mg by mouth at bedtime.    Yes [provider]  Polyethyl Glycol-Propyl Glycol (SYSTANE OP) Place 2 drops into both eyes 2 (two) times daily as needed (dry eyes).    Yes [provider]  potassium chloride (K-DUR) 10 MEQ tablet Take 1 tablet (10 mEq total) by mouth as directed. Take 1 tablet daily for 3 days; then only take when you take the lasix as needed Patient taking differently: Take 10 mEq by mouth daily as needed (when taking lasix).  01/07/17 04/16/17 Yes Bhagat, Bhavinkumar, PA  rosuvastatin (CRESTOR) 5 MG tablet Take 1 tablet (5 mg total) by mouth every morning. 11/20/16  Yes Lovena Le,  Champ Mungo, MD  tamsulosin (FLOMAX) 0.4 MG CAPS capsule Take 0.4 mg by mouth daily. 12/04/16  Yes [provider]  traZODone (DESYREL) 50 MG tablet TAKE 1/2 TO 1 TABLET BY MOUTH AS NEEDED FOR SLEEP 03/08/17  Yes [provider]  XARELTO 20 MG TABS tablet TAKE 1 TABLET BY MOUTH  DAILY AFTER SUPPER 10/25/16  Yes Evans Lance, MD    Family History Family History  Problem Relation Age of Onset  . Cancer Mother        colon  . Heart attack Father   . Migraines Brother     Social History Social History   Tobacco Use  . Smoking status: Former Smoker    Packs/day: 2.00    Years: 30.00    Pack years: 60.00    Types: Cigarettes    Last attempt to quit: 02/27/1998    Years since quitting: 19.1  . Smokeless tobacco: Never Used  Substance  Use Topics  . Alcohol use: Yes    Alcohol/week: 8.4 oz    Types: 14 Standard drinks or equivalent per week    Comment: daily  . Drug use: No     Allergies   Tetanus toxoid; Fish oil; Glucosamine-chondroitin; Ibuprofen; and Naproxen   Review of Systems Review of Systems  Constitutional: Positive for fatigue.  Respiratory: Positive for shortness of breath. Negative for cough.   Cardiovascular: Positive for leg swelling. Negative for chest pain.  Gastrointestinal: Negative for abdominal pain and blood in stool.  Genitourinary: Negative for hematuria.  Neurological: Positive for light-headedness.  All other systems reviewed and are negative.    Physical Exam Updated Vital Signs BP (!) 121/97   Pulse 82   Temp 98.1 F (36.7 C) (Oral)   Resp 19   Ht 5\' 9"  (1.753 m)   Wt 100.7 kg (222 lb)   SpO2 100%   BMI 32.78 kg/m   Physical Exam  Constitutional: He appears well-developed and well-nourished. No distress.  Pale-appearing, tachypneic.  HENT:  Head: Normocephalic and atraumatic.  Mouth/Throat: Oropharynx is clear and moist.  Eyes: Conjunctivae are normal.  Neck: Normal range of motion. Neck supple.  Cardiovascular: Normal rate, normal heart sounds and intact distal pulses.  Pulmonary/Chest: Effort normal. No stridor. No respiratory distress. He has no wheezes. He has rales (bibasilar).  Abdominal: Soft. Bowel sounds are normal. He exhibits no distension and no mass. There is no tenderness. There is no rebound.  Musculoskeletal:  2+ pitting pretibial edema bilaterally  Neurological: He is alert.  Skin: Skin is warm.  Psychiatric: He has a normal mood and affect. His behavior is normal.  Nursing note and vitals reviewed.    ED Treatments / Results  Labs (all labs ordered are listed, but only abnormal results are displayed) Labs Reviewed  BASIC METABOLIC PANEL - Abnormal; Notable for the following components:      Result Value   Glucose, Bld 119 (*)    Calcium  8.8 (*)    All other components within normal limits  CBC - Abnormal; Notable for the following components:   RBC 3.45 (*)    Hemoglobin 7.0 (*)    HCT 25.5 (*)    MCV 73.9 (*)    MCH 20.3 (*)    MCHC 27.5 (*)    RDW 18.1 (*)    All other components within normal limits  URINALYSIS, ROUTINE W REFLEX MICROSCOPIC  POC OCCULT BLOOD, ED  TYPE AND SCREEN  PREPARE RBC (CROSSMATCH)    EKG  EKG Interpretation  Date/Time:  Monday April 15 2017 18:32:40 EST Ventricular Rate:  71 PR Interval:  144 QRS Duration: 90 QT Interval:  440 QTC Calculation: 478 R Axis:   9 Text Interpretation:  Normal sinus rhythm Low voltage QRS Cannot rule out Anterior infarct , age undetermined Abnormal ECG When compared with ECG of 04/05/2016, No significant change was found Confirmed by Delora Fuel ([id number]) on 04/15/2017 11:26:29 PM       Radiology Dg Chest 2 View  Result Date: 04/16/2017 CLINICAL DATA:  Chronic progressive shortness of breath. Generalized weakness and fatigue. EXAM: CHEST  2 VIEW COMPARISON:  CT of the chest performed 04/09/2017 FINDINGS: The lungs are well-aerated. Mild peribronchial thickening is noted. Chronic lung changes are again seen. There is no evidence of focal opacification, pleural effusion or pneumothorax. The heart is borderline enlarged. No acute osseous abnormalities are seen. Bilateral hip arthroplasties are incompletely imaged, but appear grossly unremarkable. IMPRESSION: Mild peribronchial thickening noted. Chronic lung changes again seen. No superimposed focal airspace consolidation identified. Electronically Signed   By: Garald Balding M.D.   On: 04/16/2017 01:47   Ct Abdomen Pelvis W Contrast  Result Date: 04/16/2017 CLINICAL DATA:  Acute onset of shortness of breath. Generalized weakness and fatigue. EXAM: CT ABDOMEN AND PELVIS WITH CONTRAST TECHNIQUE: Multidetector CT imaging of the abdomen and pelvis was performed using the standard protocol following bolus  administration of intravenous contrast. CONTRAST:  155mL ISOVUE-300 IOPAMIDOL (ISOVUE-300) INJECTION 61% COMPARISON:  Retroperitoneal ultrasound performed 09/23/2008 FINDINGS: Lower chest: Scattered coronary artery calcifications are seen. Scattered peripheral blebs are noted at the lung bases, with associated scarring. Hepatobiliary: The liver is unremarkable in appearance. A stone is seen within the gallbladder. The gallbladder is otherwise unremarkable. The common bile duct remains normal in caliber. Pancreas: The pancreas is within normal limits. Spleen: The spleen is unremarkable in appearance. Adrenals/Urinary Tract: The adrenal glands are unremarkable in appearance. Mild nonspecific perinephric stranding is noted bilaterally. A nonobstructing 5 mm stone is noted at the upper pole of the left kidney. There no evidence of hydronephrosis. No obstructing ureteral stones are identified. Stomach/Bowel: The stomach is unremarkable in appearance. The small bowel is within normal limits. The appendix is not visualized; there is no evidence for appendicitis. Scattered diverticulosis is noted along the descending and sigmoid colon, without evidence of diverticulitis. Vascular/Lymphatic: Diffuse calcification is seen along the abdominal aorta and its branches. The abdominal aorta is otherwise grossly unremarkable. The inferior vena cava is grossly unremarkable. No retroperitoneal lymphadenopathy is seen. No pelvic sidewall lymphadenopathy is identified. Reproductive: The bladder is mildly distended and grossly unremarkable. The prostate remains normal in size. Other: No additional soft tissue abnormalities are seen. Musculoskeletal: No acute osseous abnormalities are identified. There is grade 1 anterolisthesis of L4 on L5, reflecting underlying facet disease. Associated vacuum phenomenon is noted. The visualized musculature is unremarkable in appearance. IMPRESSION: 1. No acute abnormality seen to explain the patient's  symptoms. 2. Cholelithiasis.  Gallbladder otherwise unremarkable. 3. Nonobstructing 5 mm stone at the upper pole of the left kidney. 4. Scattered diverticulosis along the descending and sigmoid colon, without evidence of diverticulitis. 5. Scattered peripheral blebs at the lung bases, with associated scarring. 6. Scattered coronary artery calcifications seen. Aortic Atherosclerosis (ICD10-I70.0). Electronically Signed   By: Garald Balding M.D.   On: 04/16/2017 02:09    Procedures Procedures (including critical care time) CRITICAL CARE Performed by: [place] N Robinson   Total critical care time: 30 minutes  Critical care time was  exclusive of separately billable procedures and treating other patients.  Critical care was necessary to treat or prevent imminent or life-threatening deterioration.  Critical care was time spent personally by me on the following activities: development of treatment plan with patient and/or surrogate as well as nursing, discussions with consultants, evaluation of patient's response to treatment, examination of patient, obtaining history from patient or surrogate, ordering and performing treatments and interventions, ordering and review of laboratory studies, ordering and review of radiographic studies, pulse oximetry and re-evaluation of patient's condition.  Medications Ordered in ED Medications  0.9 %  sodium chloride infusion (not administered)  traZODone (DESYREL) tablet 50 mg (not administered)  iopamidol (ISOVUE-300) 61 % injection (100 mLs  Contrast Given 04/16/17 0150)     Initial Impression / Assessment and Plan / ED Course  I have reviewed the triage vital signs and the nursing notes.  Pertinent labs & imaging results that were available during my care of the patient were reviewed by me and considered in my medical decision making (see chart for details).     Pt presenting to ED with worsening dyspnea. Pt on xarelto for afib. Pt pale on exam,  tachypneic, mild LE edema, not in distress. Hbg 7.0, HCT 25.5; normal 1 year ago. Stool hemoccult done in triage is negative. CT abdomen neg for retroperitoneal hemorrhage. Unknown source of bleeding at this time. Transfusion ordered for symptomatic anemia. Pt requiring admission. Dr. Hal Hope consulted, accepting admission; requesting anemia panel, however it appears blood transfusion had already begun.  Patient discussed with and seen by Dr. Roxanne Mins.  The patient appears reasonably stabilized for admission considering the current resources, flow, and capabilities available in the ED at this time, and I doubt any other [facility] requiring further screening and/or treatment in the ED prior to admission.   Final Clinical Impressions(s) / ED Diagnoses   Final diagnoses:  Symptomatic anemia    ED Discharge Orders    None       Robinson, [place] N, PA-C 84/66/59 [id number]    Delora Fuel, MD 01/77/93 [phone]

## 2017-04-16 NOTE — Telephone Encounter (Signed)
No action needed

## 2017-04-16 NOTE — Progress Notes (Addendum)
ANTICOAGULATION CONSULT NOTE - Initial Consult  Pharmacy Consult for Heparin Indication: atrial fibrillation  Allergies  Allergen Reactions  . Tetanus Toxoid Anaphylaxis, Hives and Other (See Comments)    REACTION: hives/throat swells shut  . Fish Oil Other (See Comments)    Stomach cramps, can't eat   . Glucosamine-Chondroitin Other (See Comments)    Stomach cramps, can't eat  . Ibuprofen Nausea Only and Other (See Comments)    Stomach pains  . Naproxen Diarrhea    Patient Measurements: Height: 5\' 9"  (175.3 cm) Weight: 222 lb (100.7 kg) IBW/kg (Calculated) : 70.7 Heparin Dosing Weight: 90 kg  Vital Signs: Temp: 98.1 F (36.7 C) (02/05 0535) Temp Source: Oral (02/05 0535) BP: 138/71 (02/05 0535) Pulse Rate: 84 (02/05 0535)  Labs: Recent Labs    04/15/17 1636 04/15/17 1920  HGB 6.9 Repeated and verified X2.* 7.0*  HCT 23.4 Repeated and verified X2.* 25.5*  PLT 250.0 250  CREATININE 0.89 0.85    Estimated Creatinine Clearance: 86.5 mL/min (by C-G formula based on SCr of 0.85 mg/dL).   Medical History: Past Medical History:  Diagnosis Date  . Arthritis   . Atrial fibrillation, persistent ([place])   . Chronic sinusitis   . Coronary artery disease   . Depression   . Difficult intubation    was told with shoulder done 2006-alittle narrow  . 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 Hypertension   . Jejunostomy tube fell out   . 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 Spastic colon   . Stroke (cerebrum) ([place])   . SVT (supraventricular tachycardia) ([facility])     Medications:  No current facility-administered medications on file prior to encounter.    Current Outpatient Medications on File Prior to Encounter  Medication Sig Dispense Refill  . acetaminophen (TYLENOL) 325 MG tablet Take 650 mg by mouth every 6 (six) hours as needed for moderate pain.    Marland Kitchen amLODipine (NORVASC) 5 MG tablet TAKE 1 TABLET BY MOUTH  DAILY 90 tablet 3  . Cholecalciferol  (VITAMIN D3) 5000 units TABS Take 5,000 Units by mouth daily.    . Cyanocobalamin (VITAMIN B-12) 500 MCG SUBL Place 500 mcg under the tongue 3 (three) times a week.     . dofetilide (TIKOSYN) 250 MCG capsule TAKE 1 CAPSULE BY MOUTH TWO TIMES DAILY 180 capsule 3  . furosemide (LASIX) 40 MG tablet Take 1 tablet (40 mg total) by mouth as directed. Take 1 tablet daily for 3 days then take only as needed (Patient taking differently: Take 40 mg by mouth daily as needed for fluid. ) 30 tablet 6  . irbesartan (AVAPRO) 150 MG tablet TAKE 1 TABLET(150 MG) BY MOUTH DAILY 90 tablet 3  . LORazepam (ATIVAN) 0.5 MG tablet Take 1/2 to 1 tablet daily as needed for acute anxiety. Limit use. Do no mix w/ trazodone.    . magnesium oxide (MAG-OX) 400 MG tablet Take 400 mg by mouth daily.    . Melatonin 10 MG TABS Take 10 mg by mouth at bedtime as needed (sleep).     . metoprolol tartrate (LOPRESSOR) 25 MG tablet TAKE 1 TABLET BY MOUTH TWO  TIMES DAILY 180 tablet 3  . Multiple Vitamins-Minerals (PRESERVISION AREDS 2 PO) Take 1 capsule by mouth 2 (two) times daily.    . pantoprazole (PROTONIX) 40 MG tablet TAKE 1 TABLET(40 MG) BY MOUTH DAILY    . PARoxetine (PAXIL) 30 MG tablet Take 40 mg by mouth at bedtime.     Marland Kitchen  polycarbophil (FIBERCON) 625 MG tablet Take 1,250 mg by mouth at bedtime.     [name] Glycol-Propyl Glycol (SYSTANE OP) Place 2 drops into both eyes 2 (two) times daily as needed (dry eyes).     . potassium chloride (K-DUR) 10 MEQ tablet Take 1 tablet (10 mEq total) by mouth as directed. Take 1 tablet daily for 3 days; then only take when you take the lasix as needed (Patient taking differently: Take 10 mEq by mouth daily as needed (when taking lasix). ) 30 tablet 6  . rosuvastatin (CRESTOR) 5 MG tablet Take 1 tablet (5 mg total) by mouth every morning. 90 tablet 3  . tamsulosin (FLOMAX) 0.4 MG CAPS capsule Take 0.4 mg by mouth daily.  11  . traZODone (DESYREL) 50 MG tablet TAKE 1/2 TO 1 TABLET BY MOUTH AS  NEEDED FOR SLEEP    . XARELTO 20 MG TABS tablet TAKE 1 TABLET BY MOUTH  DAILY AFTER SUPPER 90 tablet 1  . [DISCONTINUED] furosemide (LASIX) 40 MG tablet Take 40 mg by mouth 3 (three) times daily as needed for fluid.    . [DISCONTINUED] potassium chloride (K-DUR) 10 MEQ tablet Take 10 mEq by mouth 3 (three) times daily as needed.       Assessment: 77 y.o. male admitted with SOB, anemia, h/o Afib and Xarelto on hold, for heparin.  Last dose of Xarelto PM 2/3.   Goal of Therapy:  APTT 66-102 Heparin level 0.3-0.7 Monitor platelets by anticoagulation protocol: Yes   Plan:  Start heparin 1350 units/hr Check aPTT in 8 hours.   Caryl Pina 04/16/2017,6:01 AM

## 2017-04-16 NOTE — H&P (Signed)
History and Physical    George Vega OFB:[id number] DOB: 1941/02/13 DOA: 04/15/2017  PCP: [name], MD  Patient coming from: Home.  Chief Complaint: Low hemoglobin.  HPI: George Vega is a 77 y.o. male with history of atrial fibrillation, CAD, hypertension, stroke was referred to the ER after patient's hemoglobin was found to be low at around 7.  Patient has been having chronic exertional shortness of breath and has been following with cardiology and pulmonary.  Blood work done showed low hemoglobin and patient had hemoglobin around 14 last year January 2018.  Patient denies noticing any GI bleed or black stools.  Denies using any NSAIDs.  Patient is on Xarelto.  ED Course: In the ER repeat hemoglobin was found to be around 7 and 2 units of PRBC transfusion was ordered since patient was symptomatic.  Chest x-ray did not show anything acute.  CT of the abdomen and pelvis was done to rule out any retroperitoneal bleed which did not show anything acute.  Stool for occult blood was negative.  Patient states he gets colonoscopy every 3 years.  Review of Systems: As per HPI, rest all negative.   Past Medical History:  Diagnosis Date  . Arthritis   . Atrial fibrillation, persistent ([place])   . Chronic sinusitis   . Coronary artery disease   . Depression   . Difficult intubation    was told with shoulder done 2006-alittle narrow  . 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 Hypertension   . Jejunostomy tube fell out   . 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 Spastic colon   . Stroke (cerebrum) ([place])   . SVT (supraventricular tachycardia) (HCC)     Past Surgical History:  Procedure Laterality Date  . ESOPHAGEAL DILATION  2017  . FOOT NEUROMA SURGERY  2002  . LEFT HEART CATHETERIZATION WITH CORONARY ANGIOGRAM Right 06/14/2011   20% LM, chronic occluded mid LAD, 50% ostial LCX, 20% mid RI, RCA with collaterals to mid LAD, mid 10% stenosis, EF 60% 06/14/11  . NASAL  SINUS SURGERY    . SHOULDER ARTHROSCOPY  03/01/2011   Procedure: ARTHROSCOPY SHOULDER;  Surgeon: Ninetta Lights, MD;  Location: [place];  Service: Orthopedics;  Laterality: Right;  arthroscopy shoulder decompression subacromial partial acromioplasty with coracoacromial release, distal claviculectomy, debridement of labrium  . SHOULDER ARTHROSCOPY  03/01/2011   Procedure: ARTHROSCOPY SHOULDER;  Surgeon: Ninetta Lights, MD;  Location: [place];  Service: Orthopedics;  Laterality: Right;  arthroscopy shoulder decompression subacromial partial acromioplasty with coracoacromial release, distal claviculectomy, debridement of labrium  . TEE WITHOUT CARDIOVERSION N/A 03/19/2016   Procedure: TRANSESOPHAGEAL ECHOCARDIOGRAM (TEE);  Surgeon: Larey Dresser, MD;  Location: [place];  Service: Cardiovascular;  Laterality: N/A;  . TOTAL KNEE ARTHROPLASTY Right 09/23/2013   Procedure: TOTAL KNEE ARTHROPLASTY;  Surgeon: Ninetta Lights, MD;  Location: [place];  Service: Orthopedics;  Laterality: Right;  . TOTAL SHOULDER ARTHROPLASTY  06/28/2011   Procedure: TOTAL SHOULDER ARTHROPLASTY;  Surgeon: Ninetta Lights, MD;  Location: [place];  Service: Orthopedics;  Laterality: Right;  . UVULOPALATOPHARYNGOPLASTY       reports that he quit smoking about 19 years ago. His smoking use included cigarettes. He has a 60.00 pack-year smoking history. he has never used smokeless tobacco. He reports that he drinks about 8.4 oz of alcohol per week. He reports that he does not use drugs.  Allergies  Allergen Reactions  .  Tetanus Toxoid Anaphylaxis, Hives and Other (See Comments)    REACTION: hives/throat swells shut  . Fish Oil Other (See Comments)    Stomach cramps, can't eat   . Glucosamine-Chondroitin Other (See Comments)    Stomach cramps, can't eat  . Ibuprofen Nausea Only and Other (See Comments)    Stomach pains  . Naproxen Diarrhea    Family History  Problem  Relation Age of Onset  . Cancer Mother        colon  . Heart attack Father   . Migraines Brother     Prior to Admission medications   Medication Sig Start Date End Date Taking? Authorizing Provider  acetaminophen (TYLENOL) 325 MG tablet Take 650 mg by mouth every 6 (six) hours as needed for moderate pain.   Yes [provider]  amLODipine (NORVASC) 5 MG tablet TAKE 1 TABLET BY MOUTH  DAILY 02/14/17  Yes Evans Lance, MD  Cholecalciferol (VITAMIN D3) 5000 units TABS Take 5,000 Units by mouth daily.   Yes [provider]  Cyanocobalamin (VITAMIN B-12) 500 MCG SUBL Place 500 mcg under the tongue 3 (three) times a week.    Yes [provider]  dofetilide (TIKOSYN) 250 MCG capsule TAKE 1 CAPSULE BY MOUTH TWO TIMES DAILY 02/14/17  Yes Evans Lance, MD  furosemide (LASIX) 40 MG tablet Take 1 tablet (40 mg total) by mouth as directed. Take 1 tablet daily for 3 days then take only as needed Patient taking differently: Take 40 mg by mouth daily as needed for fluid.  01/07/17 04/16/17 Yes Bhagat, Bhavinkumar, PA  irbesartan (AVAPRO) 150 MG tablet TAKE 1 TABLET(150 MG) BY MOUTH DAILY 12/31/16  Yes Evans Lance, MD  LORazepam (ATIVAN) 0.5 MG tablet Take 1/2 to 1 tablet daily as needed for acute anxiety. Limit use. Do no mix w/ trazodone. 02/26/17 02/27/20 Yes [provider]  magnesium oxide (MAG-OX) 400 MG tablet Take 400 mg by mouth daily.   Yes [provider]  Melatonin 10 MG TABS Take 10 mg by mouth at bedtime as needed (sleep).    Yes [provider]  metoprolol tartrate (LOPRESSOR) 25 MG tablet TAKE 1 TABLET BY MOUTH TWO  TIMES DAILY 02/14/17  Yes Evans Lance, MD  Multiple Vitamins-Minerals (PRESERVISION AREDS 2 PO) Take 1 capsule by mouth 2 (two) times daily.   Yes [provider]  pantoprazole (PROTONIX) 40 MG tablet TAKE 1 TABLET(40 MG) BY MOUTH DAILY 02/22/17  Yes [provider]  PARoxetine (PAXIL) 30 MG tablet Take  40 mg by mouth at bedtime.  01/25/17  Yes [provider]  polycarbophil (FIBERCON) 625 MG tablet Take 1,250 mg by mouth at bedtime.    Yes [provider]  Polyethyl Glycol-Propyl Glycol (SYSTANE OP) Place 2 drops into both eyes 2 (two) times daily as needed (dry eyes).    Yes [provider]  potassium chloride (K-DUR) 10 MEQ tablet Take 1 tablet (10 mEq total) by mouth as directed. Take 1 tablet daily for 3 days; then only take when you take the lasix as needed Patient taking differently: Take 10 mEq by mouth daily as needed (when taking lasix).  01/07/17 04/16/17 Yes Bhagat, Bhavinkumar, PA  rosuvastatin (CRESTOR) 5 MG tablet Take 1 tablet (5 mg total) by mouth every morning. 11/20/16  Yes Evans Lance, MD  tamsulosin (FLOMAX) 0.4 MG CAPS capsule Take 0.4 mg by mouth daily. 12/04/16  Yes [provider]  traZODone (DESYREL) 50 MG tablet TAKE  1/2 TO 1 TABLET BY MOUTH AS NEEDED FOR SLEEP 03/08/17  Yes [provider]  XARELTO 20 MG TABS tablet TAKE 1 TABLET BY MOUTH  DAILY AFTER SUPPER 10/25/16  Yes Evans Lance, MD    Physical Exam: Vitals:   04/16/17 0430 04/16/17 0445 04/16/17 0500 04/16/17 0535  BP: 140/86 (!) 121/97  138/71  Pulse: 81  82 84  Resp: 16 19 19  (!) 23  Temp:    98.1 F (36.7 C)  TempSrc:    Oral  SpO2: 100%  100% 97%  Weight:      Height:          Constitutional: Moderately built and nourished. Vitals:   04/16/17 0430 04/16/17 0445 04/16/17 0500 04/16/17 0535  BP: 140/86 (!) 121/97  138/71  Pulse: 81  82 84  Resp: 16 19 19  (!) 23  Temp:    98.1 F (36.7 C)  TempSrc:    Oral  SpO2: 100%  100% 97%  Weight:      Height:       Eyes: Anicteric mild pallor. ENMT: No discharge from the ears eyes nose or mouth. Neck: No mass palpated no neck rigidity no JVD appreciated. Respiratory: No rhonchi or crepitations. Cardiovascular: S1-S2 heard no murmurs appreciated. Abdomen: Soft nontender bowel sounds  present. Musculoskeletal: No edema.  No joint effusion. Skin: No rash. Neurologic: Alert awake oriented to time place and person.  Moves all extremities. Psychiatric: Appears normal.  Normal affect.   Labs on Admission: I have personally reviewed following labs and imaging studies  CBC: Recent Labs  Lab 04/15/17 1636 04/15/17 1920  WBC 8.6 8.3  NEUTROABS 5.4  --   HGB 6.9 Repeated and verified X2.* 7.0*  HCT 23.4 Repeated and verified X2.* 25.5*  MCV 68.6 aL* 73.9*  PLT 250.0 332   Basic Metabolic Panel: Recent Labs  Lab 04/15/17 1636 04/15/17 1920  NA 139 139  K 4.1 3.8  CL 104 105  CO2 29 23  GLUCOSE 140* 119*  BUN 18 15  CREATININE 0.89 0.85  CALCIUM 8.7 8.8*   GFR: Estimated Creatinine Clearance: 86.5 mL/min (by C-G formula based on SCr of 0.85 mg/dL). Liver Function Tests: Recent Labs  Lab 04/15/17 1636  AST 12  ALT 9  ALKPHOS 97  BILITOT 0.5  PROT 6.7  ALBUMIN 3.8   No results for input(s): LIPASE, AMYLASE in the last 168 hours. No results for input(s): AMMONIA in the last 168 hours. Coagulation Profile: No results for input(s): INR, PROTIME in the last 168 hours. Cardiac Enzymes: No results for input(s): CKTOTAL, CKMB, CKMBINDEX, TROPONINI in the last 168 hours. BNP (last 3 results) Recent Labs    01/07/17 1517 04/15/17 1636  PROBNP 309 117.0*   HbA1C: No results for input(s): HGBA1C in the last 72 hours. CBG: No results for input(s): GLUCAP in the last 168 hours. Lipid Profile: No results for input(s): CHOL, HDL, LDLCALC, TRIG, CHOLHDL, LDLDIRECT in the last 72 hours. Thyroid Function Tests: Recent Labs    04/15/17 1636  TSH 1.43   Anemia Panel: No results for input(s): VITAMINB12, FOLATE, FERRITIN, TIBC, IRON, RETICCTPCT in the last 72 hours. Urine analysis:    Component Value Date/Time   COLORURINE YELLOW 04/16/2017 [place] 04/16/2017 0238   LABSPEC 1.023 04/16/2017 0238   PHURINE 5.0 04/16/2017 0238    GLUCOSEU NEGATIVE 04/16/2017 0238   HGBUR NEGATIVE 04/16/2017 [place] NEGATIVE 04/16/2017 0238   KETONESUR NEGATIVE 04/16/2017 0238  PROTEINUR NEGATIVE 04/16/2017 0238   UROBILINOGEN 0.2 02/05/2014 1628   NITRITE NEGATIVE 04/16/2017 0238   LEUKOCYTESUR NEGATIVE 04/16/2017 0238   Sepsis Labs: @LABRCNTIP (procalcitonin:4,lacticidven:4) )No results found for this or any previous visit (from the past 240 hour(s)).   Radiological Exams on Admission: Dg Chest 2 View  Result Date: 04/16/2017 CLINICAL DATA:  Chronic progressive shortness of breath. Generalized weakness and fatigue. EXAM: CHEST  2 VIEW COMPARISON:  CT of the chest performed 04/09/2017 FINDINGS: The lungs are well-aerated. Mild peribronchial thickening is noted. Chronic lung changes are again seen. There is no evidence of focal opacification, pleural effusion or pneumothorax. The heart is borderline enlarged. No acute osseous abnormalities are seen. Bilateral hip arthroplasties are incompletely imaged, but appear grossly unremarkable. IMPRESSION: Mild peribronchial thickening noted. Chronic lung changes again seen. No superimposed focal airspace consolidation identified. Electronically Signed   By: Garald Balding M.D.   On: 04/16/2017 01:47   Ct Abdomen Pelvis W Contrast  Result Date: 04/16/2017 CLINICAL DATA:  Acute onset of shortness of breath. Generalized weakness and fatigue. EXAM: CT ABDOMEN AND PELVIS WITH CONTRAST TECHNIQUE: Multidetector CT imaging of the abdomen and pelvis was performed using the standard protocol following bolus administration of intravenous contrast. CONTRAST:  11mL ISOVUE-300 IOPAMIDOL (ISOVUE-300) INJECTION 61% COMPARISON:  Retroperitoneal ultrasound performed 09/23/2008 FINDINGS: Lower chest: Scattered coronary artery calcifications are seen. Scattered peripheral blebs are noted at the lung bases, with associated scarring. Hepatobiliary: The liver is unremarkable in appearance. A stone is seen  within the gallbladder. The gallbladder is otherwise unremarkable. The common bile duct remains normal in caliber. Pancreas: The pancreas is within normal limits. Spleen: The spleen is unremarkable in appearance. Adrenals/Urinary Tract: The adrenal glands are unremarkable in appearance. Mild nonspecific perinephric stranding is noted bilaterally. A nonobstructing 5 mm stone is noted at the upper pole of the left kidney. There no evidence of hydronephrosis. No obstructing ureteral stones are identified. Stomach/Bowel: The stomach is unremarkable in appearance. The small bowel is within normal limits. The appendix is not visualized; there is no evidence for appendicitis. Scattered diverticulosis is noted along the descending and sigmoid colon, without evidence of diverticulitis. Vascular/Lymphatic: Diffuse calcification is seen along the abdominal aorta and its branches. The abdominal aorta is otherwise grossly unremarkable. The inferior vena cava is grossly unremarkable. No retroperitoneal lymphadenopathy is seen. No pelvic sidewall lymphadenopathy is identified. Reproductive: The bladder is mildly distended and grossly unremarkable. The prostate remains normal in size. Other: No additional soft tissue abnormalities are seen. Musculoskeletal: No acute osseous abnormalities are identified. There is grade 1 anterolisthesis of L4 on L5, reflecting underlying facet disease. Associated vacuum phenomenon is noted. The visualized musculature is unremarkable in appearance. IMPRESSION: 1. No acute abnormality seen to explain the patient's symptoms. 2. Cholelithiasis.  Gallbladder otherwise unremarkable. 3. Nonobstructing 5 mm stone at the upper pole of the left kidney. 4. Scattered diverticulosis along the descending and sigmoid colon, without evidence of diverticulitis. 5. Scattered peripheral blebs at the lung bases, with associated scarring. 6. Scattered coronary artery calcifications seen. Aortic Atherosclerosis  (ICD10-I70.0). Electronically Signed   By: Garald Balding M.D.   On: 04/16/2017 02:09    EKG: Independently reviewed.  Normal sinus rhythm with low voltage.  Assessment/Plan Principal Problem:   Symptomatic anemia Active Problems:   CAD (coronary artery disease)   Dyspnea on exertion   Persistent atrial fibrillation (HCC)   Pulmonary nodules    1. Symptomatic anemia microcytic hypochromic -2 units of packed red blood cell transfusion has been  already ordered.  May eventually need to get an anemia panel later as outpatient.  May discuss with gastroenterologist or probably hematologist in a.m.  Patient likes to get an opinion from Dr. Beryle Beams, hematologist in a.m. if possible.  Repeat hemoglobin after transfusion. 2. Dyspnea on exertion anemia likely contributing.  Patient also has shortness of breath on lying flat.  I have ordered 1 dose of Lasix. 3. Persistent atrial fibrillation on Tikosyn and Xarelto.  Since patient has low hemoglobin and may need procedures for now I am holding Xarelto and placing patient on heparin. 4. Hypertension on Cozaar Norvasc and metoprolol. 5. History of CAD on metoprolol and statins and anticoagulations. 6. Pulmonary nodule being followed by pulmonologist.   DVT prophylaxis: Heparin. Code Status: Full code. Family Communication: Patient's wife and daughter. Disposition Plan: Home. Consults called: None. Admission status: Observation.   Rise Patience MD [facility] Pager [phone].  If 7PM-7AM, please contact night-coverage [email or web address] Password [facility]  04/16/2017, 5:53 AM

## 2017-04-16 NOTE — ED Notes (Signed)
Pt placed on regular bed.

## 2017-04-16 NOTE — ED Notes (Signed)
Patient transported to X-ray 

## 2017-04-16 NOTE — ED Notes (Signed)
Pt denies any SOB at this time. Heparin gtt infusing. Denies pain at the site of infusion. Family at bedside. Pt reports that he just wants to rest.

## 2017-04-16 NOTE — ED Notes (Signed)
Patient transported to CT 

## 2017-04-16 NOTE — Telephone Encounter (Signed)
F/U Call:  Patient daughter calling [name])  [name] states that patient is in room [phone] and that patient's hemoglobin was 6.9 so he had two blood transfusions.  Patient has internal bleeding and they are not sure where the bleeding is coming from, patient will be coming off blood thinner in preparation for surgery.

## 2017-04-16 NOTE — ED Notes (Signed)
Meal tray ordered. Clear Liquids.

## 2017-04-16 NOTE — Telephone Encounter (Signed)
[name] is calling to let you know that [name] is being admitted to have surgery and they are talking about switching him from Xarelto to Heparin in order to do the surgery because he has some internal bleeding . Please call if you have any questions . Thanks

## 2017-04-16 NOTE — Consult Note (Signed)
[facility] Consult: 9:04 AM 04/16/2017  LOS: 0 days    Referring Provider: Dr Roger Shelter  Primary Care Physician:  [name], MD Primary Gastroenterologist:  Dr Earlean Shawl.        Reason for Consultation:  Anemia, FOBT negative.     HPI: George Vega is a 77 y.o. male.  Hx Afib, on Xarelto, sinus rhythm since starting dofetilide.   CAD.  Htn.  CVA 6 yrs ago.  PVD.  Hx vertebral artery stenosis.  S/p orthopedic surgery n shoulders, knees.  Stable lung nodules, Dr Melvyn Novas favors benign.  Emphysema.     Sent to ED for eval of hgb 7.  Was ~ 14 one year ago.  S/p PRBC x 3:  2007 Colonoscopy for hx polyps 1994, + fm hx colon cancer.  3 polyps (adenomatous and hyperplastic).  Left sided tics.   06/2008 Colonoscopy, for personal hx colon polyps 1994, mother's hx colon cancer age 61.  3 and 5 mm polyps (adenomatous and hyperplastic) at cecum, sigmoid.  Left sided tics, internal rrhoids.  Spring 2017 EGD with esophageal dilatation.   Results not in Epic Spring 2017 Colonoscopy.  Results not in Epic.    Sent by Dr Melvyn Novas to ED after finding Hgb of 6.9, MCV 68 on 2/4.   No renal insufficiency.   7 to 10 days of orthostatic dizziness.  Baseline activity limited by arthritis and DOE. For several months having oral bleeding of small amounts, rinses blood from mouth in AM before brushing teeth and also with teeth brushing.  His dentist says he has ulcers in his gums.  No blood PR or dark stools. Good appetite, wt up 6-7 # in 12 months.  Pt takes no ASA or NSAIDs.  Drinks 2 to 3 bourbon/waters per night.  Has never been RXd iron, B12, folate.  Never transfused before never been told he was anemic.  Dr Earlean Shawl never did colonoscopy or EGD for Anemia, it was for hx polyps and dysphagia.  His dysphagia was cured after dilation.     FOBT negative in ED.  S/p 2 U PRBC.   CT abd/pelvis with contrast:  Cholelithiasis.  Kidney stone left.  Diverticulosis descending sigmoid.  Lung blebs and scarring.  Coronary artery calcifications.     Past Medical History:  Diagnosis Date  . Arthritis   . Atrial fibrillation, persistent ([place])   . Chronic sinusitis   . Coronary artery disease   . Depression   . Difficult intubation    was told with shoulder done 2006-alittle narrow  . 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 Hypertension   . Jejunostomy tube fell out   . 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 Spastic colon   . Stroke (cerebrum) ([place])   . SVT (supraventricular tachycardia) ([facility])     Past Surgical History:  Procedure Laterality Date  . ESOPHAGEAL DILATION  2017  . FOOT NEUROMA SURGERY  2002  . LEFT HEART CATHETERIZATION WITH CORONARY ANGIOGRAM Right 06/14/2011   20% LM, chronic occluded mid LAD, 50%  ostial LCX, 20% mid RI, RCA with collaterals to mid LAD, mid 10% stenosis, EF 60% 06/14/11  . NASAL SINUS SURGERY    . SHOULDER ARTHROSCOPY  03/01/2011   Procedure: ARTHROSCOPY SHOULDER;  Surgeon: Ninetta Lights, MD;  Location: [facility];  Service: Orthopedics;  Laterality: Right;  arthroscopy shoulder decompression subacromial partial acromioplasty with coracoacromial release, distal claviculectomy, debridement of labrium  . SHOULDER ARTHROSCOPY  03/01/2011   Procedure: ARTHROSCOPY SHOULDER;  Surgeon: Ninetta Lights, MD;  Location: [place];  Service: Orthopedics;  Laterality: Right;  arthroscopy shoulder decompression subacromial partial acromioplasty with coracoacromial release, distal claviculectomy, debridement of labrium  . TEE WITHOUT CARDIOVERSION N/A 03/19/2016   Procedure: TRANSESOPHAGEAL ECHOCARDIOGRAM (TEE);  Surgeon: Larey Dresser, MD;  Location: [facility];  Service: Cardiovascular;  Laterality: N/A;  . TOTAL KNEE ARTHROPLASTY Right 09/23/2013   Procedure:  TOTAL KNEE ARTHROPLASTY;  Surgeon: Ninetta Lights, MD;  Location: [place];  Service: Orthopedics;  Laterality: Right;  . TOTAL SHOULDER ARTHROPLASTY  06/28/2011   Procedure: TOTAL SHOULDER ARTHROPLASTY;  Surgeon: Ninetta Lights, MD;  Location: [place] [facility];  Service: Orthopedics;  Laterality: Right;  . UVULOPALATOPHARYNGOPLASTY      Prior to Admission medications   Medication Sig Start Date End Date Taking? Authorizing Provider  acetaminophen (TYLENOL) 325 MG tablet Take 650 mg by mouth every 6 (six) hours as needed for moderate pain.   Yes [provider]  amLODipine (NORVASC) 5 MG tablet TAKE 1 TABLET BY MOUTH  DAILY 02/14/17  Yes Evans Lance, MD  Cholecalciferol (VITAMIN D3) 5000 units TABS Take 5,000 Units by mouth daily.   Yes [provider]  Cyanocobalamin (VITAMIN B-12) 500 MCG SUBL Place 500 mcg under the tongue 3 (three) times a week.    Yes [provider]  dofetilide (TIKOSYN) 250 MCG capsule TAKE 1 CAPSULE BY MOUTH TWO TIMES DAILY 02/14/17  Yes Evans Lance, MD  furosemide (LASIX) 40 MG tablet Take 1 tablet (40 mg total) by mouth as directed. Take 1 tablet daily for 3 days then take only as needed Patient taking differently: Take 40 mg by mouth daily as needed for fluid.  01/07/17 04/16/17 Yes Bhagat, Bhavinkumar, PA  irbesartan (AVAPRO) 150 MG tablet TAKE 1 TABLET(150 MG) BY MOUTH DAILY 12/31/16  Yes Evans Lance, MD  LORazepam (ATIVAN) 0.5 MG tablet Take 1/2 to 1 tablet daily as needed for acute anxiety. Limit use. Do no mix w/ trazodone. 02/26/17 02/27/20 Yes [provider]  magnesium oxide (MAG-OX) 400 MG tablet Take 400 mg by mouth daily.   Yes [provider]  Melatonin 10 MG TABS Take 10 mg by mouth at bedtime as needed (sleep).    Yes [provider]  metoprolol tartrate (LOPRESSOR) 25 MG tablet TAKE 1 TABLET BY MOUTH TWO  TIMES DAILY 02/14/17  Yes Evans Lance, MD  Multiple Vitamins-Minerals  (PRESERVISION AREDS 2 PO) Take 1 capsule by mouth 2 (two) times daily.   Yes [provider]  pantoprazole (PROTONIX) 40 MG tablet TAKE 1 TABLET(40 MG) BY MOUTH DAILY 02/22/17  Yes [provider]  PARoxetine (PAXIL) 30 MG tablet Take 40 mg by mouth at bedtime.  01/25/17  Yes [provider]  polycarbophil (FIBERCON) 625 MG tablet Take 1,250 mg by mouth at bedtime.    Yes [provider]  Polyethyl Glycol-Propyl Glycol (SYSTANE OP) Place 2 drops into both eyes 2 (two) times daily as needed (dry eyes).  Yes [provider]  potassium chloride (K-DUR) 10 MEQ tablet Take 1 tablet (10 mEq total) by mouth as directed. Take 1 tablet daily for 3 days; then only take when you take the lasix as needed Patient taking differently: Take 10 mEq by mouth daily as needed (when taking lasix).  01/07/17 04/16/17 Yes Bhagat, Bhavinkumar, PA  rosuvastatin (CRESTOR) 5 MG tablet Take 1 tablet (5 mg total) by mouth every morning. 11/20/16  Yes Evans Lance, MD  tamsulosin (FLOMAX) 0.4 MG CAPS capsule Take 0.4 mg by mouth daily. 12/04/16  Yes [provider]  traZODone (DESYREL) 50 MG tablet TAKE 1/2 TO 1 TABLET BY MOUTH AS NEEDED FOR SLEEP 03/08/17  Yes [provider]  XARELTO 20 MG TABS tablet TAKE 1 TABLET BY MOUTH  DAILY AFTER SUPPER 10/25/16  Yes Evans Lance, MD    Scheduled Meds: . amLODipine  5 mg Oral Daily  . cholecalciferol  5,000 Units Oral Daily  . dofetilide  250 mcg Oral BID  . furosemide  20 mg Intravenous Once  . irbesartan  150 mg Oral Daily  . magnesium oxide  400 mg Oral Daily  . metoprolol tartrate  25 mg Oral BID  . pantoprazole  40 mg Oral Daily  . PARoxetine  40 mg Oral QHS  . polycarbophil  1,250 mg Oral QHS  . rosuvastatin  5 mg Oral q morning - 10a  . tamsulosin  0.4 mg Oral Daily   Infusions: . heparin     PRN Meds: acetaminophen **OR** acetaminophen, LORazepam, Melatonin, ondansetron **OR** ondansetron (ZOFRAN)  IV, traZODone   Allergies as of 04/15/2017 - Review Complete 04/15/2017  Allergen Reaction Noted  . Tetanus toxoid Anaphylaxis, Hives, and Other (See Comments) 06/02/2008  . Fish oil Other (See Comments) 05/03/2011  . Glucosamine-chondroitin Other (See Comments) 05/03/2011  . Ibuprofen Nausea Only and Other (See Comments) 08/26/2013  . Naproxen Diarrhea 01/12/2014    Family History  Problem Relation Age of Onset  . Cancer Mother        colon  . Heart attack Father   . Migraines Brother     Social History   Socioeconomic History  . Marital status: Married    Spouse name: Not on file  . Number of children: 3  . Years of education: Not on file  . Highest education level: Not on file  Social Needs  . Financial resource strain: Not on file  . Food insecurity - worry: Not on file  . Food insecurity - inability: Not on file  . Transportation needs - medical: Not on file  . Transportation needs - non-medical: Not on file  Occupational History  . Occupation: retired  Tobacco Use  . Smoking status: Former Smoker    Packs/day: 2.00    Years: 30.00    Pack years: 60.00    Types: Cigarettes    Last attempt to quit: 02/27/1998    Years since quitting: 19.1  . Smokeless tobacco: Never Used  Substance and Sexual Activity  . Alcohol use: Yes    Alcohol/week: 8.4 oz    Types: 14 Standard drinks or equivalent per week    Comment: daily  . Drug use: No  . Sexual activity: Not on file  Other Topics Concern  . Not on file  Social History Narrative   Patient is left handed.   Patient drinks one cup caffeine daily.    REVIEW OF SYSTEMS: Constitutional:  Per HPI ENT:  No nose bleeds Pulm:  Sleep study 1 week ago, results not yet revealed to pt CV:  No chest pain, pressure.  + bil ankle/pedal edema GU:  No hematuria, no frequency GI:  Per HPI Heme:  Per HPI   Transfusions:  Per HPI Neuro:  No headaches, no peripheral tingling or numbness Derm:  No itching, no rash or sores.   Endocrine:  No sweats or chills.  No polyuria or dysuria Immunization:  Not queried.   Travel:  None beyond local counties in last few months.    PHYSICAL EXAM: Vital signs in last 24 hours: Vitals:   04/16/17 0715 04/16/17 0800  BP: (!) 152/76 (!) 157/78  Pulse: 80   Resp: (!) 23 (!) 28  Temp:    SpO2: 93%    Wt Readings from Last 3 Encounters:  04/15/17 100.7 kg (222 lb)  04/15/17 101 kg (222 lb 9.6 oz)  04/09/17 98 kg (216 lb)    General: Pleasant, somewhat chronically ill looking WM Head: No facial asymmetry or swelling Eyes: Pink.  No icterus.  EOMI. Ears: Slightly hard of hearing. Nose: No discharge or congestion. Mouth: Good dentition.  Do not see oral ulcers.  Tongue midline.  Mucosa moist, clear with slight nonspecific, Ivory-colored coating on tongue Neck: No JVD.  No bruits.  No thyromegaly. Lungs: Dry rales in the bases bilaterally.  These extend halfway up right long.  Dyspnea with minor effort.  No cough. Heart: RRR.  No MRG.  S1, S2 present.  Sinus rhythm on telemetry monitor. Abdomen: Soft.  Nontender, nondistended.  Active bowel sounds.  No masses, no organomegaly, no bruits, no hernias..   Rectal: Deferred rectal exam. Musc/Skeltl: No joint swelling, redness.  Postsurgical changes in the shoulders knees. Extremities: 2+ pitting edema feet/ankles. Neurologic: Alert.  Slightly hard of hearing.  Oriented x3.  Good historian CT scan of the abdomen and pelvis speech.  Moves all 4 limbs, strength not tested.  No tremor. Skin: No rashes, no sores grew bruising or purpura. Nodes: No cervical or inguinal adenopathy. Psych: Cooperative, calm, pleasant.  Intake/Output from previous day: 02/04 0701 - 02/05 0700 In: 315 [Blood:315] Out: -  Intake/Output this shift: No intake/output data recorded.  LAB RESULTS: Recent Labs    04/15/17 1636 04/15/17 1920  WBC 8.6 8.3  HGB 6.9 Repeated and verified X2.* 7.0*  HCT 23.4 Repeated and verified X2.* 25.5*  PLT  250.0 250   BMET Lab Results  Component Value Date   NA 139 04/15/2017   NA 139 04/15/2017   NA 142 01/07/2017   K 3.8 04/15/2017   K 4.1 04/15/2017   K 4.7 01/07/2017   CL 105 04/15/2017   CL 104 04/15/2017   CL 103 01/07/2017   CO2 23 04/15/2017   CO2 29 04/15/2017   CO2 23 01/07/2017   GLUCOSE 119 (H) 04/15/2017   GLUCOSE 140 (H) 04/15/2017   GLUCOSE 120 (H) 01/07/2017   BUN 15 04/15/2017   BUN 18 04/15/2017   BUN 15 01/07/2017   CREATININE 0.85 04/15/2017   CREATININE 0.89 04/15/2017   CREATININE 0.82 01/07/2017   CALCIUM 8.8 (L) 04/15/2017   CALCIUM 8.7 04/15/2017   CALCIUM 8.8 01/07/2017   LFT Recent Labs    04/15/17 1636  PROT 6.7  ALBUMIN 3.8  AST 12  ALT 9  ALKPHOS 97  BILITOT 0.5  BILIDIR 0.1   PT/INR Lab Results  Component Value Date   INR 1.35 04/16/2017   INR 0.96 09/15/2013   INR 1.03 04/02/2013  Hepatitis Panel No results for input(s): HEPBSAG, HCVAB, HEPAIGM, HEPBIGM in the last 72 hours. C-Diff No components found for: CDIFF Lipase     Component Value Date/Time   LIPASE 37 02/05/2014 1512    Drugs of Abuse  No results found for: LABOPIA, COCAINSCRNUR, LABBENZ, AMPHETMU, THCU, LABBARB   RADIOLOGY STUDIES: Dg Chest 2 View  Result Date: 04/16/2017 CLINICAL DATA:  Chronic progressive shortness of breath. Generalized weakness and fatigue. EXAM: CHEST  2 VIEW COMPARISON:  CT of the chest performed 04/09/2017 FINDINGS: The lungs are well-aerated. Mild peribronchial thickening is noted. Chronic lung changes are again seen. There is no evidence of focal opacification, pleural effusion or pneumothorax. The heart is borderline enlarged. No acute osseous abnormalities are seen. Bilateral hip arthroplasties are incompletely imaged, but appear grossly unremarkable. IMPRESSION: Mild peribronchial thickening noted. Chronic lung changes again seen. No superimposed focal airspace consolidation identified. Electronically Signed   By: Garald Balding M.D.    On: 04/16/2017 01:47   Ct Abdomen Pelvis W Contrast  Result Date: 04/16/2017 CLINICAL DATA:  Acute onset of shortness of breath. Generalized weakness and fatigue. EXAM: CT ABDOMEN AND PELVIS WITH CONTRAST TECHNIQUE: Multidetector CT imaging of the abdomen and pelvis was performed using the standard protocol following bolus administration of intravenous contrast. CONTRAST:  139mL ISOVUE-300 IOPAMIDOL (ISOVUE-300) INJECTION 61% COMPARISON:  Retroperitoneal ultrasound performed 09/23/2008 FINDINGS: Lower chest: Scattered coronary artery calcifications are seen. Scattered peripheral blebs are noted at the lung bases, with associated scarring. Hepatobiliary: The liver is unremarkable in appearance. A stone is seen within the gallbladder. The gallbladder is otherwise unremarkable. The common bile duct remains normal in caliber. Pancreas: The pancreas is within normal limits. Spleen: The spleen is unremarkable in appearance. Adrenals/Urinary Tract: The adrenal glands are unremarkable in appearance. Mild nonspecific perinephric stranding is noted bilaterally. A nonobstructing 5 mm stone is noted at the upper pole of the left kidney. There no evidence of hydronephrosis. No obstructing ureteral stones are identified. Stomach/Bowel: The stomach is unremarkable in appearance. The small bowel is within normal limits. The appendix is not visualized; there is no evidence for appendicitis. Scattered diverticulosis is noted along the descending and sigmoid colon, without evidence of diverticulitis. Vascular/Lymphatic: Diffuse calcification is seen along the abdominal aorta and its branches. The abdominal aorta is otherwise grossly unremarkable. The inferior vena cava is grossly unremarkable. No retroperitoneal lymphadenopathy is seen. No pelvic sidewall lymphadenopathy is identified. Reproductive: The bladder is mildly distended and grossly unremarkable. The prostate remains normal in size. Other: No additional soft tissue  abnormalities are seen. Musculoskeletal: No acute osseous abnormalities are identified. There is grade 1 anterolisthesis of L4 on L5, reflecting underlying facet disease. Associated vacuum phenomenon is noted. The visualized musculature is unremarkable in appearance. IMPRESSION: 1. No acute abnormality seen to explain the patient's symptoms. 2. Cholelithiasis.  Gallbladder otherwise unremarkable. 3. Nonobstructing 5 mm stone at the upper pole of the left kidney. 4. Scattered diverticulosis along the descending and sigmoid colon, without evidence of diverticulitis. 5. Scattered peripheral blebs at the lung bases, with associated scarring. 6. Scattered coronary artery calcifications seen. Aortic Atherosclerosis (ICD10-I70.0). Electronically Signed   By: Garald Balding M.D.   On: 04/16/2017 02:09     IMPRESSION:   *    Microcytic anemia.   FOBT negative.  Patient having no overt bleeding in terms of melena or BPR. Prior surveillance colonoscopy was for history of colon polyps, not anemia. Diverticulosis and hemorrhoids also noted on prior colonoscopies. Prior EGD with dilation of  esophagus a little less than 2 years ago.  No recurrent dysphagia.  Protonix daily.  *   History of CVA.  History of A. fib, currently well controlled with meds.  Chronic long-term Xarelto.  On hold.    *   Emphysema.  Pulmonary nodules, felt to be benign.  Followed by Dr. Melvyn Novas.  *  OSA.    Sleep study completed 04/09/17: - Moderate obstructive sleep apnea - Insufficient sleep to meet protocol requirements for split CPAP titration on this study night. - Significant difficulty initiating and maintaining  sleep, very restless, complaining of itiching, pulling off leads and monitors. - No significant central sleep apnea occurred during this study  - Moderate oxygen desaturation was noted during this study - The patient snored with moderate snoring volume. - EKG findings include PVCs. - Severe periodic limb movements of sleep  occurred during the  study. Associated arousals were significant.   PLAN:     *  Refer pt back to Dr Earlean Shawl for any necessary GI work up which might include repeat EGD, colonoscopy, capsule endoscopy?  *  Leave off Xarelto for now.     *  Referral to a hematologist?      Azucena Freed  04/16/2017, 9:04 AM Pager: [phone]

## 2017-04-17 ENCOUNTER — Inpatient Hospital Stay (HOSPITAL_COMMUNITY): Payer: Medicare Other | Admitting: Anesthesiology

## 2017-04-17 ENCOUNTER — Inpatient Hospital Stay (HOSPITAL_COMMUNITY): Payer: Medicare Other

## 2017-04-17 ENCOUNTER — Encounter (HOSPITAL_COMMUNITY)
Admission: EM | Disposition: A | Discharge status: Home / Self Care | Payer: Self-pay | Source: Home / Self Care | Attending: Internal Medicine

## 2017-04-17 ENCOUNTER — Encounter (INDEPENDENT_AMBULATORY_CARE_PROVIDER_SITE_OTHER): Payer: Medicare Other | Admitting: Ophthalmology

## 2017-04-17 ENCOUNTER — Encounter (HOSPITAL_COMMUNITY): Payer: Self-pay | Admitting: *Deleted

## 2017-04-17 DIAGNOSIS — D500 Iron deficiency anemia secondary to blood loss (chronic): Secondary | ICD-10-CM

## 2017-04-17 DIAGNOSIS — Z87891 Personal history of nicotine dependence: Secondary | ICD-10-CM

## 2017-04-17 DIAGNOSIS — R0609 Other forms of dyspnea: Secondary | ICD-10-CM

## 2017-04-17 DIAGNOSIS — I10 Essential (primary) hypertension: Secondary | ICD-10-CM

## 2017-04-17 DIAGNOSIS — Z888 Allergy status to other drugs, medicaments and biological substances status: Secondary | ICD-10-CM

## 2017-04-17 DIAGNOSIS — D509 Iron deficiency anemia, unspecified: Secondary | ICD-10-CM

## 2017-04-17 DIAGNOSIS — K648 Other hemorrhoids: Secondary | ICD-10-CM

## 2017-04-17 DIAGNOSIS — K259 Gastric ulcer, unspecified as acute or chronic, without hemorrhage or perforation: Secondary | ICD-10-CM

## 2017-04-17 DIAGNOSIS — I482 Chronic atrial fibrillation: Secondary | ICD-10-CM

## 2017-04-17 DIAGNOSIS — I481 Persistent atrial fibrillation: Secondary | ICD-10-CM

## 2017-04-17 DIAGNOSIS — Z8711 Personal history of peptic ulcer disease: Secondary | ICD-10-CM

## 2017-04-17 DIAGNOSIS — G43909 Migraine, unspecified, not intractable, without status migrainosus: Secondary | ICD-10-CM

## 2017-04-17 DIAGNOSIS — Z800 Family history of malignant neoplasm of digestive organs: Secondary | ICD-10-CM

## 2017-04-17 DIAGNOSIS — K5520 Angiodysplasia of colon without hemorrhage: Secondary | ICD-10-CM

## 2017-04-17 DIAGNOSIS — R918 Other nonspecific abnormal finding of lung field: Secondary | ICD-10-CM

## 2017-04-17 DIAGNOSIS — Z9889 Other specified postprocedural states: Secondary | ICD-10-CM

## 2017-04-17 DIAGNOSIS — Z96651 Presence of right artificial knee joint: Secondary | ICD-10-CM

## 2017-04-17 DIAGNOSIS — K5730 Diverticulosis of large intestine without perforation or abscess without bleeding: Secondary | ICD-10-CM

## 2017-04-17 DIAGNOSIS — I2510 Atherosclerotic heart disease of native coronary artery without angina pectoris: Secondary | ICD-10-CM

## 2017-04-17 DIAGNOSIS — Z862 Personal history of diseases of the blood and blood-forming organs and certain disorders involving the immune mechanism: Secondary | ICD-10-CM

## 2017-04-17 DIAGNOSIS — K31819 Angiodysplasia of stomach and duodenum without bleeding: Secondary | ICD-10-CM

## 2017-04-17 DIAGNOSIS — Z886 Allergy status to analgesic agent status: Secondary | ICD-10-CM

## 2017-04-17 DIAGNOSIS — Z8673 Personal history of transient ischemic attack (TIA), and cerebral infarction without residual deficits: Secondary | ICD-10-CM

## 2017-04-17 DIAGNOSIS — I340 Nonrheumatic mitral (valve) insufficiency: Secondary | ICD-10-CM

## 2017-04-17 DIAGNOSIS — Q2733 Arteriovenous malformation of digestive system vessel: Secondary | ICD-10-CM

## 2017-04-17 DIAGNOSIS — Z7901 Long term (current) use of anticoagulants: Secondary | ICD-10-CM

## 2017-04-17 DIAGNOSIS — Z887 Allergy status to serum and vaccine status: Secondary | ICD-10-CM

## 2017-04-17 HISTORY — PX: ESOPHAGOGASTRODUODENOSCOPY (EGD) WITH PROPOFOL: SHX5813

## 2017-04-17 HISTORY — PX: COLONOSCOPY WITH PROPOFOL: SHX5780

## 2017-04-17 LAB — HEPARIN LEVEL (UNFRACTIONATED): HEPARIN UNFRACTIONATED: 0.51 [IU]/mL (ref 0.30–0.70)

## 2017-04-17 LAB — CBC
HEMATOCRIT: 30.9 % — AB (ref 39.0–52.0)
HEMOGLOBIN: 9 g/dL — AB (ref 13.0–17.0)
MCH: 22.2 pg — ABNORMAL LOW (ref 26.0–34.0)
MCHC: 29.1 g/dL — AB (ref 30.0–36.0)
MCV: 76.3 fL — ABNORMAL LOW (ref 78.0–100.0)
Platelets: 248 10*3/uL (ref 150–400)
RBC: 4.05 MIL/uL — ABNORMAL LOW (ref 4.22–5.81)
RDW: 19.1 % — ABNORMAL HIGH (ref 11.5–15.5)
WBC: 8.9 10*3/uL (ref 4.0–10.5)

## 2017-04-17 LAB — TYPE AND SCREEN
ABO/RH(D): O NEG
ANTIBODY SCREEN: NEGATIVE
UNIT DIVISION: 0
Unit division: 0

## 2017-04-17 LAB — BPAM RBC
BLOOD PRODUCT EXPIRATION DATE: 201903072359
Blood Product Expiration Date: 201903072359
ISSUE DATE / TIME: 201902050227
ISSUE DATE / TIME: 201902050611
Unit Type and Rh: 9500
Unit Type and Rh: 9500

## 2017-04-17 LAB — IRON AND TIBC
IRON: 19 ug/dL — AB (ref 45–182)
SATURATION RATIOS: 4 % — AB (ref 17.9–39.5)
TIBC: 475 ug/dL — AB (ref 250–450)
UIBC: 456 ug/dL

## 2017-04-17 LAB — APTT
aPTT: 125 seconds — ABNORMAL HIGH (ref 24–36)
aPTT: 66 seconds — ABNORMAL HIGH (ref 24–36)

## 2017-04-17 LAB — ECHOCARDIOGRAM COMPLETE
Height: 69 in
WEIGHTICAEL: 3488 [oz_av]

## 2017-04-17 LAB — FERRITIN: Ferritin: 10 ng/mL — ABNORMAL LOW (ref 24–336)

## 2017-04-17 SURGERY — ESOPHAGOGASTRODUODENOSCOPY (EGD) WITH PROPOFOL
Anesthesia: Monitor Anesthesia Care

## 2017-04-17 MED ORDER — RIVAROXABAN 20 MG PO TABS
20.0000 mg | ORAL_TABLET | Freq: Every day | ORAL | Status: DC
  Filled 2017-04-17: qty 1

## 2017-04-17 MED ORDER — PROPOFOL 500 MG/50ML IV EMUL
INTRAVENOUS | Status: DC | PRN
  Administered 2017-04-17: 100 ug/kg/min via INTRAVENOUS

## 2017-04-17 MED ORDER — PHENYLEPHRINE HCL 10 MG/ML IJ SOLN
INTRAMUSCULAR | Status: DC | PRN
  Administered 2017-04-17: 160 ug via INTRAVENOUS
  Administered 2017-04-17: 80 ug via INTRAVENOUS
  Administered 2017-04-17 (×3): 120 ug via INTRAVENOUS

## 2017-04-17 MED ORDER — LACTATED RINGERS IV SOLN
INTRAVENOUS | Status: DC
  Administered 2017-04-17 (×2): via INTRAVENOUS

## 2017-04-17 MED ORDER — PROPOFOL 10 MG/ML IV BOLUS
INTRAVENOUS | Status: DC | PRN
  Administered 2017-04-17: 40 mg via INTRAVENOUS

## 2017-04-17 MED ORDER — SODIUM CHLORIDE 0.9 % IV SOLN
510.0000 mg | Freq: Once | INTRAVENOUS | Status: AC
  Administered 2017-04-17: 510 mg via INTRAVENOUS
  Filled 2017-04-17 (×2): qty 17

## 2017-04-17 MED ORDER — RIVAROXABAN 20 MG PO TABS
20.0000 mg | ORAL_TABLET | Freq: Every day | ORAL | Status: DC
Start: 2017-04-17 — End: 2017-04-18
  Administered 2017-04-17: 20 mg via ORAL
  Filled 2017-04-17: qty 1

## 2017-04-17 MED ORDER — LIDOCAINE HCL (CARDIAC) 20 MG/ML IV SOLN
INTRAVENOUS | Status: DC | PRN
  Administered 2017-04-17: 80 mg via INTRAVENOUS

## 2017-04-17 SURGICAL SUPPLY — 25 items

## 2017-04-17 NOTE — Progress Notes (Signed)
[facility] for Heparin Indication: atrial fibrillation  Allergies  Allergen Reactions  . Tetanus Toxoid Anaphylaxis, Hives and Other (See Comments)    REACTION: hives/throat swells shut  . Fish Oil Other (See Comments)    Stomach cramps, can't eat   . Glucosamine-Chondroitin Other (See Comments)    Stomach cramps, can't eat  . Ibuprofen Nausea Only and Other (See Comments)    Stomach pains  . Naproxen Diarrhea    Patient Measurements: Height: 5\' 9"  (175.3 cm) Weight: 218 lb (98.9 kg) IBW/kg (Calculated) : 70.7 Heparin Dosing Weight: 90 kg  Vital Signs: Temp: 97.8 F (36.6 C) (02/05 2035) Temp Source: Oral (02/05 1750) BP: 136/72 (02/05 2035) Pulse Rate: 81 (02/05 2035)  Labs: Recent Labs    04/15/17 1636 04/15/17 1920 04/16/17 0605 04/16/17 1636 04/17/17 0059  HGB 6.9 Repeated and verified X2.* 7.0*  --  9.0* 9.0*  HCT 23.4 Repeated and verified X2.* 25.5*  --  31.6* 30.9*  PLT 250.0 250  --  232 248  APTT  --   --  37* 65* 125*  LABPROT  --   --  16.5*  --   --   INR  --   --  1.35  --   --   HEPARINUNFRC  --   --  1.46*  --   --   CREATININE 0.89 0.85  --   --   --     Estimated Creatinine Clearance: 85.8 mL/min (by C-G formula based on SCr of 0.85 mg/dL).   Assessment: 77 y.o. male admitted with SOB, anemia, h/o Afib and Xarelto on hold, for heparin.   Goal of Therapy:  APTT 66-102 Heparin level 0.3-0.7 Monitor platelets by anticoagulation protocol: Yes   Plan:  Decrease heparin 1300 units/hr Check aPTT in 8 hours.   Caryl Pina 04/17/2017,2:06 AM

## 2017-04-17 NOTE — Progress Notes (Signed)
ANTICOAGULATION CONSULT NOTE- Follow up Pharmacy Consult to restart Xarelto Indication: h/o nonvalvular atrial fibrillation  Allergies  Allergen Reactions  . Tetanus Toxoid Anaphylaxis, Hives and Other (See Comments)    REACTION: hives/throat swells shut  . Fish Oil Other (See Comments)    Stomach cramps, can't eat   . Glucosamine-Chondroitin Other (See Comments)    Stomach cramps, can't eat  . Ibuprofen Nausea Only and Other (See Comments)    Stomach pains  . Naproxen Diarrhea    Patient Measurements: Height: 5\' 9"  (175.3 cm) Weight: 218 lb (98.9 kg) IBW/kg (Calculated) : 70.7 Heparin Dosing Weight: 90 kg  Vital Signs: Temp: 97.7 F (36.5 C) (02/06 1240) Temp Source: Oral (02/06 1240) BP: 112/45 (02/06 1325) Pulse Rate: 68 (02/06 1325)  Labs: Recent Labs    04/15/17 1636 04/15/17 1920  04/16/17 0605 04/16/17 1636 04/17/17 0059 04/17/17 0617  HGB 6.9 Repeated and verified X2.* 7.0*  --   --  9.0* 9.0*  --   HCT 23.4 Repeated and verified X2.* 25.5*  --   --  31.6* 30.9*  --   PLT 250.0 250  --   --  232 248  --   APTT  --   --    < > 37* 65* 125* 66*  LABPROT  --   --   --  16.5*  --   --   --   INR  --   --   --  1.35  --   --   --   HEPARINUNFRC  --   --   --  1.46*  --   --  0.51  CREATININE 0.89 0.85  --   --   --   --   --    < > = values in this interval not displayed.    Estimated Creatinine Clearance: 85.8 mL/min (by C-G formula based on SCr of 0.85 mg/dL).   Assessment: 77 y.o. male admitted with SOB, anemia,  on Xarelto PTA for h/o Afib.  Xarelto was held on admission due to Hgb 6.9 anemia and started on IV heparin. Heparin stopped this AM in prep for EGD.  Hgb 9.0 today.  s/p EGD and colonoscopy today 2/6. EGD showed 2 cm hiatal hernia, normal esophagus. 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 -Colonoscopy:, Treated with APC.  Diverticulosis sigmoid colon and descending colon.  Tattoo was seen  in the distal transverse colon.  Internal hemorrhoids. 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 3 nonbleeding colonic angiodysplastic lesions  GI said okay to resume Xarelto this afternoon and Dr. Ree Kida consulted pharmacy to resume Xarelto for nonvalvular Afib hx.    Plan:  Restart Xarelto 20mg  daily with supper tonight. Monitor for bleeding s/sx.  Thank you for allowing pharmacy to be part of this patients care team.  Nicole Cella, [place] Clinical Pharmacist Pager: [phone]-4P [phone]-10P [place] [phone] 04/17/2017,3:09 PM

## 2017-04-17 NOTE — Progress Notes (Signed)
Daily Rounding Note  04/17/2017, 10:00 AM  LOS: 1 day   SUBJECTIVE:   Chief complaint:     Stools yellow water after bowel prep.  No dizziness, SOB, weakness.  Feels well.  Up to bathroom multiple times with prep  OBJECTIVE:         Vital signs in last 24 hours:    Temp:  [97.8 F (36.6 C)-98.2 F (36.8 C)] 98.2 F (36.8 C) (02/06 0810) Pulse Rate:  [72-84] 73 (02/06 0810) Resp:  [15-25] 16 (02/06 0810) BP: (106-152)/(55-108) 106/55 (02/06 0810) SpO2:  [91 %-100 %] 96 % (02/06 0810) Weight:  [98.9 kg (218 lb)] 98.9 kg (218 lb) (02/05 2035) Last BM Date: 04/16/17 Filed Weights   04/15/17 1853 04/16/17 2035  Weight: 100.7 kg (222 lb) 98.9 kg (218 lb)   General: still a bit ashen/pale in color   Heart: RRR Chest: clear bil.  Less dyspneic Abdomen: soft, active BS.  NT, ND  Extremities: no CCE Neuro/Psych:  Oriented x 3. Fully alert.   Intake/Output from previous day: 02/05 0701 - 02/06 0700 In: 526.3 [I.V.:211.3; Blood:315] Out: 1050 [Urine:1050]  Intake/Output this shift: No intake/output data recorded.  Lab Results: Recent Labs    04/15/17 1920 04/16/17 1636 04/17/17 0059  WBC 8.3 9.4 8.9  HGB 7.0* 9.0* 9.0*  HCT 25.5* 31.6* 30.9*  PLT 250 232 248   BMET Recent Labs    04/15/17 1636 04/15/17 1920  NA 139 139  K 4.1 3.8  CL 104 105  CO2 29 23  GLUCOSE 140* 119*  BUN 18 15  CREATININE 0.89 0.85  CALCIUM 8.7 8.8*   LFT Recent Labs    04/15/17 1636  PROT 6.7  ALBUMIN 3.8  AST 12  ALT 9  ALKPHOS 97  BILITOT 0.5  BILIDIR 0.1   PT/INR Recent Labs    04/16/17 0605  LABPROT 16.5*  INR 1.35   Hepatitis Panel No results for input(s): HEPBSAG, HCVAB, HEPAIGM, HEPBIGM in the last 72 hours.  Studies/Results: Dg Chest 2 View  Result Date: 04/16/2017 CLINICAL DATA:  Chronic progressive shortness of breath. Generalized weakness and fatigue. EXAM: CHEST  2 VIEW COMPARISON:  CT of the  chest performed 04/09/2017 FINDINGS: The lungs are well-aerated. Mild peribronchial thickening is noted. Chronic lung changes are again seen. There is no evidence of focal opacification, pleural effusion or pneumothorax. The heart is borderline enlarged. No acute osseous abnormalities are seen. Bilateral hip arthroplasties are incompletely imaged, but appear grossly unremarkable. IMPRESSION: Mild peribronchial thickening noted. Chronic lung changes again seen. No superimposed focal airspace consolidation identified. Electronically Signed   By: Garald Balding M.D.   On: 04/16/2017 01:47   Ct Abdomen Pelvis W Contrast  Result Date: 04/16/2017 CLINICAL DATA:  Acute onset of shortness of breath. Generalized weakness and fatigue. EXAM: CT ABDOMEN AND PELVIS WITH CONTRAST TECHNIQUE: Multidetector CT imaging of the abdomen and pelvis was performed using the standard protocol following bolus administration of intravenous contrast. CONTRAST:  131mL ISOVUE-300 IOPAMIDOL (ISOVUE-300) INJECTION 61% COMPARISON:  Retroperitoneal ultrasound performed 09/23/2008 FINDINGS: Lower chest: Scattered coronary artery calcifications are seen. Scattered peripheral blebs are noted at the lung bases, with associated scarring. Hepatobiliary: The liver is unremarkable in appearance. A stone is seen within the gallbladder. The gallbladder is otherwise unremarkable. The common bile duct remains normal in caliber. Pancreas: The pancreas is within normal limits. Spleen: The spleen is unremarkable in appearance. Adrenals/Urinary Tract: The adrenal glands are  unremarkable in appearance. Mild nonspecific perinephric stranding is noted bilaterally. A nonobstructing 5 mm stone is noted at the upper pole of the left kidney. There no evidence of hydronephrosis. No obstructing ureteral stones are identified. Stomach/Bowel: The stomach is unremarkable in appearance. The small bowel is within normal limits. The appendix is not visualized; there is no  evidence for appendicitis. Scattered diverticulosis is noted along the descending and sigmoid colon, without evidence of diverticulitis. Vascular/Lymphatic: Diffuse calcification is seen along the abdominal aorta and its branches. The abdominal aorta is otherwise grossly unremarkable. The inferior vena cava is grossly unremarkable. No retroperitoneal lymphadenopathy is seen. No pelvic sidewall lymphadenopathy is identified. Reproductive: The bladder is mildly distended and grossly unremarkable. The prostate remains normal in size. Other: No additional soft tissue abnormalities are seen. Musculoskeletal: No acute osseous abnormalities are identified. There is grade 1 anterolisthesis of L4 on L5, reflecting underlying facet disease. Associated vacuum phenomenon is noted. The visualized musculature is unremarkable in appearance. IMPRESSION: 1. No acute abnormality seen to explain the patient's symptoms. 2. Cholelithiasis.  Gallbladder otherwise unremarkable. 3. Nonobstructing 5 mm stone at the upper pole of the left kidney. 4. Scattered diverticulosis along the descending and sigmoid colon, without evidence of diverticulitis. 5. Scattered peripheral blebs at the lung bases, with associated scarring. 6. Scattered coronary artery calcifications seen. Aortic Atherosclerosis (ICD10-I70.0). Electronically Signed   By: Garald Balding M.D.   On: 04/16/2017 02:09    ASSESMENT:   *  Microcytic anemia.  FOBT negative.  S/p PRBC x 2.  Hgb 6.9 >> 9.   Up to date on polyp screening colonosopy and had EGD/esophageal dilatation: both under 2 years ago.  On oral Protonix.    *  Chronic Xarelto, on hold.  Last dose 2/3.  Hx afib and CVA.   Attending MD started Heparin, fortunately Endo RN caught this and it stopped this ~ 7AM.      PLAN   *  Colonoscopy and EGD today 11Am    George Vega  04/17/2017, 10:00 AM Pager: [phone]

## 2017-04-17 NOTE — Anesthesia Postprocedure Evaluation (Signed)
Anesthesia Post Note  Patient: George Vega  Procedure(s) Performed: ESOPHAGOGASTRODUODENOSCOPY (EGD) WITH PROPOFOL (N/A ) COLONOSCOPY WITH PROPOFOL (N/A )     Patient location during evaluation: PACU Anesthesia Type: MAC Level of consciousness: awake and alert Pain management: pain level controlled Vital Signs Assessment: post-procedure vital signs reviewed and stable Respiratory status: spontaneous breathing, nonlabored ventilation, respiratory function stable and patient connected to nasal cannula oxygen Cardiovascular status: stable and blood pressure returned to baseline Postop Assessment: no apparent nausea or vomiting Anesthetic complications: no    Last Vitals:  Vitals:   04/17/17 1250 04/17/17 1300  BP: 114/62 (!) 105/55  Pulse: 72 70  Resp: 20 20  Temp:    SpO2: 95% 96%    Last Pain:  Vitals:   04/17/17 1240  TempSrc: Oral  PainSc:                  Tyller Bowlby DAVID

## 2017-04-17 NOTE — Interval H&P Note (Signed)
History and Physical Interval Note:  04/17/2017 10:56 AM  George Vega  has presented today for surgery, with the diagnosis of symptomatic anemia  The various methods of treatment have been discussed with the patient and family. After consideration of risks, benefits and other options for treatment, the patient has consented to  Procedure(s): ESOPHAGOGASTRODUODENOSCOPY (EGD) WITH PROPOFOL (N/A) COLONOSCOPY WITH PROPOFOL (N/A) as a surgical intervention .  The patient's history has been reviewed, patient examined, no change in status, stable for surgery.  I have reviewed the patient's chart and labs.  Questions were answered to the patient's satisfaction.     Taylor

## 2017-04-17 NOTE — H&P (View-Only) (Signed)
Daily Rounding Note  04/17/2017, 10:00 AM  LOS: 1 day   SUBJECTIVE:   Chief complaint:     Stools yellow water after bowel prep.  No dizziness, SOB, weakness.  Feels well.  Up to bathroom multiple times with prep  OBJECTIVE:         Vital signs in last 24 hours:    Temp:  [97.8 F (36.6 C)-98.2 F (36.8 C)] 98.2 F (36.8 C) (02/06 0810) Pulse Rate:  [72-84] 73 (02/06 0810) Resp:  [15-25] 16 (02/06 0810) BP: (106-152)/(55-108) 106/55 (02/06 0810) SpO2:  [91 %-100 %] 96 % (02/06 0810) Weight:  [98.9 kg (218 lb)] 98.9 kg (218 lb) (02/05 2035) Last BM Date: 04/16/17 Filed Weights   04/15/17 1853 04/16/17 2035  Weight: 100.7 kg (222 lb) 98.9 kg (218 lb)   General: still a bit ashen/pale in color   Heart: RRR Chest: clear bil.  Less dyspneic Abdomen: soft, active BS.  NT, ND  Extremities: no CCE Neuro/Psych:  Oriented x 3. Fully alert.   Intake/Output from previous day: 02/05 0701 - 02/06 0700 In: 526.3 [I.V.:211.3; Blood:315] Out: 1050 [Urine:1050]  Intake/Output this shift: No intake/output data recorded.  Lab Results: Recent Labs    04/15/17 1920 04/16/17 1636 04/17/17 0059  WBC 8.3 9.4 8.9  HGB 7.0* 9.0* 9.0*  HCT 25.5* 31.6* 30.9*  PLT 250 232 248   BMET Recent Labs    04/15/17 1636 04/15/17 1920  NA 139 139  K 4.1 3.8  CL 104 105  CO2 29 23  GLUCOSE 140* 119*  BUN 18 15  CREATININE 0.89 0.85  CALCIUM 8.7 8.8*   LFT Recent Labs    04/15/17 1636  PROT 6.7  ALBUMIN 3.8  AST 12  ALT 9  ALKPHOS 97  BILITOT 0.5  BILIDIR 0.1   PT/INR Recent Labs    04/16/17 0605  LABPROT 16.5*  INR 1.35   Hepatitis Panel No results for input(s): HEPBSAG, HCVAB, HEPAIGM, HEPBIGM in the last 72 hours.  Studies/Results: Dg Chest 2 View  Result Date: 04/16/2017 CLINICAL DATA:  Chronic progressive shortness of breath. Generalized weakness and fatigue. EXAM: CHEST  2 VIEW COMPARISON:  CT of the  chest performed 04/09/2017 FINDINGS: The lungs are well-aerated. Mild peribronchial thickening is noted. Chronic lung changes are again seen. There is no evidence of focal opacification, pleural effusion or pneumothorax. The heart is borderline enlarged. No acute osseous abnormalities are seen. Bilateral hip arthroplasties are incompletely imaged, but appear grossly unremarkable. IMPRESSION: Mild peribronchial thickening noted. Chronic lung changes again seen. No superimposed focal airspace consolidation identified. Electronically Signed   By: Garald Balding M.D.   On: 04/16/2017 01:47   Ct Abdomen Pelvis W Contrast  Result Date: 04/16/2017 CLINICAL DATA:  Acute onset of shortness of breath. Generalized weakness and fatigue. EXAM: CT ABDOMEN AND PELVIS WITH CONTRAST TECHNIQUE: Multidetector CT imaging of the abdomen and pelvis was performed using the standard protocol following bolus administration of intravenous contrast. CONTRAST:  1105mL ISOVUE-300 IOPAMIDOL (ISOVUE-300) INJECTION 61% COMPARISON:  Retroperitoneal ultrasound performed 09/23/2008 FINDINGS: Lower chest: Scattered coronary artery calcifications are seen. Scattered peripheral blebs are noted at the lung bases, with associated scarring. Hepatobiliary: The liver is unremarkable in appearance. A stone is seen within the gallbladder. The gallbladder is otherwise unremarkable. The common bile duct remains normal in caliber. Pancreas: The pancreas is within normal limits. Spleen: The spleen is unremarkable in appearance. Adrenals/Urinary Tract: The adrenal glands are  unremarkable in appearance. Mild nonspecific perinephric stranding is noted bilaterally. A nonobstructing 5 mm stone is noted at the upper pole of the left kidney. There no evidence of hydronephrosis. No obstructing ureteral stones are identified. Stomach/Bowel: The stomach is unremarkable in appearance. The small bowel is within normal limits. The appendix is not visualized; there is no  evidence for appendicitis. Scattered diverticulosis is noted along the descending and sigmoid colon, without evidence of diverticulitis. Vascular/Lymphatic: Diffuse calcification is seen along the abdominal aorta and its branches. The abdominal aorta is otherwise grossly unremarkable. The inferior vena cava is grossly unremarkable. No retroperitoneal lymphadenopathy is seen. No pelvic sidewall lymphadenopathy is identified. Reproductive: The bladder is mildly distended and grossly unremarkable. The prostate remains normal in size. Other: No additional soft tissue abnormalities are seen. Musculoskeletal: No acute osseous abnormalities are identified. There is grade 1 anterolisthesis of L4 on L5, reflecting underlying facet disease. Associated vacuum phenomenon is noted. The visualized musculature is unremarkable in appearance. IMPRESSION: 1. No acute abnormality seen to explain the patient's symptoms. 2. Cholelithiasis.  Gallbladder otherwise unremarkable. 3. Nonobstructing 5 mm stone at the upper pole of the left kidney. 4. Scattered diverticulosis along the descending and sigmoid colon, without evidence of diverticulitis. 5. Scattered peripheral blebs at the lung bases, with associated scarring. 6. Scattered coronary artery calcifications seen. Aortic Atherosclerosis (ICD10-I70.0). Electronically Signed   By: Garald Balding M.D.   On: 04/16/2017 02:09    ASSESMENT:   *  Microcytic anemia.  FOBT negative.  S/p PRBC x 2.  Hgb 6.9 >> 9.   Up to date on polyp screening colonosopy and had EGD/esophageal dilatation: both under 2 years ago.  On oral Protonix.    *  Chronic Xarelto, on hold.  Last dose 2/3.  Hx afib and CVA.   Attending MD started Heparin, fortunately Endo RN caught this and it stopped this ~ 7AM.      PLAN   *  Colonoscopy and EGD today 11Am    Azucena Freed  04/17/2017, 10:00 AM Pager: [phone]

## 2017-04-17 NOTE — Op Note (Signed)
[facility] Patient Name: George Vega Procedure Date : 04/17/2017 MRN: [id number] Attending MD: Carlota Raspberry. Armbruster MD, MD Date of Birth: [birth year] CSN: [id number] Age: 77 Admit Type: Inpatient Procedure:                Colonoscopy Indications:              Gastrointestinal occult blood loss, anemia, while                            on Xarelto for history of CVA Providers:                Carlota Raspberry. Armbruster MD, MD, Cleda Daub, RN,                            William Dalton, Technician Referring MD:              Medicines:                Monitored Anesthesia Care Complications:            No immediate complications. Estimated blood loss:                            Minimal. Estimated Blood Loss:     Estimated blood loss was minimal. Procedure:                Pre-Anesthesia Assessment:                           - Prior to the procedure, a History and Physical                            was performed, and patient medications and                            allergies were reviewed. The patient's tolerance of                            previous anesthesia was also reviewed. The risks                            and benefits of the procedure and the sedation                            options and risks were discussed with the patient.                            All questions were answered, and informed consent                            was obtained. Prior Anticoagulants: The patient has                            taken heparin, last dose was day of procedure. ASA  Grade Assessment: III - A patient with severe                            systemic disease. After reviewing the risks and                            benefits, the patient was deemed in satisfactory                            condition to undergo the procedure.                           After obtaining informed consent, the colonoscope                            was passed under direct  vision. Throughout the                            procedure, the patient's blood pressure, pulse, and                            oxygen saturations were monitored continuously. The                            EC-[id number] ([id number]) scope was introduced through                            the anus and advanced to the the cecum, identified                            by appendiceal orifice and ileocecal valve. The                            colonoscopy was performed without difficulty. The                            patient tolerated the procedure well. The quality                            of the bowel preparation was adequate. The                            ileocecal valve, appendiceal orifice, and rectum                            were photographed. Scope In: 12:02:40 PM Scope Out: 12:28:30 PM Scope Withdrawal Time: 0 hours 18 minutes 59 seconds  Total Procedure Duration: 0 hours 25 minutes 50 seconds  Findings:      The perianal and digital rectal examinations were normal.      Three angiodysplastic lesions without bleeding were found in the       ascending colon, size ranged from 70m to 7-830mat largest. Fulguration       to ablate the lesions to prevent bleeding by argon plasma was successful.  Multiple medium-mouthed diverticula were found in the sigmoid colon and       descending colon.      A tattoo was seen in the distal transverse colon.      Internal hemorrhoids were found during retroflexion.      The exam was otherwise without abnormality. Impression:               - Three non-bleeding colonic angiodysplastic                            lesions. Treated with argon plasma coagulation                            (APC).                           - Diverticulosis in the sigmoid colon and in the                            descending colon.                           - A tattoo was seen in the distal transverse colon.                           - Internal hemorrhoids.                            - The examination was otherwise normal.                           - No specimens collected.                           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 It's possible there are additional                            small bowel AVMs not seen on this exam. Moderate Sedation:      No moderate sedation, case performed with MAC Recommendation:           - Return patient to hospital ward for ongoing care.                           - Clear liquid diet, advance as tolerated tonight                           - Continue present medications.                           - Resume Xarelto later this afternoon (APC is  thought to be low risk for procedure related                            bleeding)                           - Oral ferrous sulfate 351m BID                           - Discharge tomorrow AM if stable following                            resumption of anticoagulation today Procedure Code(s):        --- Professional ---                           [phone] Colonoscopy, flexible; with control of                            bleeding, any method Diagnosis Code(s):        --- Professional ---                           K55.20, Angiodysplasia of colon without hemorrhage                           K64.8, Other hemorrhoids                           R19.5, Other fecal abnormalities                           K57.30, Diverticulosis of large intestine without                            perforation or abscess without bleeding CPT copyright 2016 [employer]. All rights reserved. The codes documented in this report are preliminary and upon coder review may  be revised to meet current compliance requirements. SRemo LippsP. Armbruster MD, MD 04/17/2017 12:41:16 PM This report has been signed electronically. Number of Addenda: 0

## 2017-04-17 NOTE — Progress Notes (Signed)
ANTICOAGULATION CONSULT NOTE- Follow up Pharmacy Consult for Heparin Indication: atrial fibrillation  Allergies  Allergen Reactions  . Tetanus Toxoid Anaphylaxis, Hives and Other (See Comments)    REACTION: hives/throat swells shut  . Fish Oil Other (See Comments)    Stomach cramps, can't eat   . Glucosamine-Chondroitin Other (See Comments)    Stomach cramps, can't eat  . Ibuprofen Nausea Only and Other (See Comments)    Stomach pains  . Naproxen Diarrhea    Patient Measurements: Height: 5\' 9"  (175.3 cm) Weight: 218 lb (98.9 kg) IBW/kg (Calculated) : 70.7 Heparin Dosing Weight: 90 kg  Vital Signs: Temp: 98.2 F (36.8 C) (02/06 0810) Temp Source: Oral (02/06 0810) BP: 106/55 (02/06 0810) Pulse Rate: 73 (02/06 0810)  Labs: Recent Labs    04/15/17 1636 04/15/17 1920  04/16/17 0605 04/16/17 1636 04/17/17 0059 04/17/17 0617  HGB 6.9 Repeated and verified X2.* 7.0*  --   --  9.0* 9.0*  --   HCT 23.4 Repeated and verified X2.* 25.5*  --   --  31.6* 30.9*  --   PLT 250.0 250  --   --  232 248  --   APTT  --   --    < > 37* 65* 125* 66*  LABPROT  --   --   --  16.5*  --   --   --   INR  --   --   --  1.35  --   --   --   HEPARINUNFRC  --   --   --  1.46*  --   --  0.51  CREATININE 0.89 0.85  --   --   --   --   --    < > = values in this interval not displayed.    Estimated Creatinine Clearance: 85.8 mL/min (by C-G formula based on SCr of 0.85 mg/dL).   Assessment: 77 y.o. male admitted with SOB, anemia, h/o Afib and Xarelto on hold, for heparin.   APTT = 66 and Heparin level = 0.51, drawn 4 hours after heparin rate decreased to 1300 units/hr.  APTT /HL are therapeutic although drawn earlier than preferred 8 hour.   Heparin drip now stopped (@07 :50) for EGD today  PTA Xarelto last given pta 04/14/17 @ 22:30  Goal of Therapy:  APTT 66-102 Heparin level 0.3-0.7 Monitor platelets by anticoagulation protocol: Yes   Plan:  F/u after EGD for anticoagulation plan for  h/o Afib, ? restart of IV heparin or pta Xarelto post EGD.  Thank you for allowing pharmacy to be part of this patients care team.  Nicole Cella, [place] Clinical Pharmacist Pager: [phone] 8A-4P [phone] 4P-10P [place] [phone] 04/17/2017,10:22 AM

## 2017-04-17 NOTE — Transfer of Care (Signed)
Immediate Anesthesia Transfer of Care Note  Patient: George Vega  Procedure(s) Performed: ESOPHAGOGASTRODUODENOSCOPY (EGD) WITH PROPOFOL (N/A ) COLONOSCOPY WITH PROPOFOL (N/A )  Patient Location: PACU and Endoscopy Unit  Anesthesia Type:MAC  Level of Consciousness: awake and patient cooperative  Airway & Oxygen Therapy: Patient Spontanous Breathing and Patient connected to nasal cannula oxygen  Post-op Assessment: Report given to RN and Post -op Vital signs reviewed and stable  Post vital signs: Reviewed and stable  Last Vitals:  Vitals:   04/17/17 0810 04/17/17 1035  BP: (!) 106/55 (!) 154/74  Pulse: 73 76  Resp: 16 20  Temp: 36.8 C 36.7 C  SpO2: 96% 98%    Last Pain:  Vitals:   04/17/17 1035  TempSrc: Oral  PainSc:          Complications: No apparent anesthesia complications

## 2017-04-17 NOTE — Consult Note (Signed)
Referring MD: Burnis Medin  PCP:  [name], MD; cardiologist: Dr. Crissie Sickles; gastroenterologist in hospital Dr Enis Gash; outpatient Dr. [place] Campbell  Hematology consultation  Reason for Referral:  Iron deficiency anemia with findings of gastric and colonic AVMs  Chief Complaint  Patient presents with  . Shortness of Breath    HPI:  Pleasant 77 year old man with hypertension, coronary artery disease, chronic atrial fibrillation on chronic Xarelto anticoagulation.  He had a mild stroke presenting with left facial droop in January 2015.  He had had a previous CT scan of the head to evaluate migraine headache in July 2006 which showed changes compatible with a previous right basal ganglia infarct.  The 2015 study questions a possible small acute pontine infarct.  Diffuse small vessel disease.  MRI showed high-grade stenosis versus occlusion of the distal left vertebral artery.  Follow-up study in August 2016 and again in October 2017 with no acute changes. 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  By September 2017 he was having episodes of paroxysmal atrial fibrillation.  He was started on Xarelto 20 mg daily which she has continued up until now.  He presented at the time of the current admission April 15, 2017 for evaluation of increasing dyspnea.  Blood count was done and showed hemoglobin 6.9 with MCV 68.6 and he was admitted for further evaluation.  Of note hemoglobin 14 g in January 2018.  He has never been anemic except for times of orthopedic surgeries. He denied any abdominal pain, hematemesis, hematochezia, melena, epistaxis, gum bleeding, or hematuria.  He denied use of aspirin or nonsteroidal anti-inflammatory drugs.  He drinks 2 bourbon drinks daily.  He had remote peptic ulcer disease when he was in the [employer] at age 89.  No known liver disease.  No excessive bleeding after multiple prior  surgical procedures including bilateral shoulder repairs and a right knee replacement.  Teeth extraction as recent as a few years ago with no excessive bleeding.  He has no constitutional symptoms and if anything he has gained some weight recently. 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  His wife is a homozygote.  He is a heterozygote.  There 3 daughters are all homozygote's.  They have variable phlebotomy requirements.  Mr. Frady has not required phlebotomy.  There is no ferritin on record in the current chart record.  I anticipate it will be low in view of his acute bleed. There is a strong family history of colon cancer in his father side of the family.  The patient undergoes survey colonoscopy every 3 years. 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  They were fulgurated.   Past Medical History:  Diagnosis Date  . Arthritis   . Atrial fibrillation, persistent ([place])   . Chronic sinusitis   . Coronary artery disease   . Depression   . Difficult intubation    was told with shoulder done 2006-alittle narrow  . 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 Hypertension   . Jejunostomy tube fell out    when asked about this in 04/2017, pt denied ever having a J tube, feeding tube, tubes placed post surgery so ??? veracity of a previous J tube.    . 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 Spastic colon   . Stroke (cerebrum) ([place])   . SVT (supraventricular tachycardia) ([facility])   :  Past Surgical History:  Procedure Laterality Date  . ESOPHAGEAL DILATION  2017  . FOOT NEUROMA SURGERY  2002  . LEFT HEART CATHETERIZATION WITH CORONARY ANGIOGRAM Right 06/14/2011   20% LM, chronic occluded mid LAD, 50% ostial LCX, 20% mid RI, RCA with collaterals to mid LAD, mid 10% stenosis, EF 60% 06/14/11  . NASAL SINUS SURGERY    . SHOULDER ARTHROSCOPY   03/01/2011   Procedure: ARTHROSCOPY SHOULDER;  Surgeon: Ninetta Lights, MD;  Location: [facility];  Service: Orthopedics;  Laterality: Right;  arthroscopy shoulder decompression subacromial partial acromioplasty with coracoacromial release, distal claviculectomy, debridement of labrium  . SHOULDER ARTHROSCOPY  03/01/2011   Procedure: ARTHROSCOPY SHOULDER;  Surgeon: Ninetta Lights, MD;  Location: [facility];  Service: Orthopedics;  Laterality: Right;  arthroscopy shoulder decompression subacromial partial acromioplasty with coracoacromial release, distal claviculectomy, debridement of labrium  . TEE WITHOUT CARDIOVERSION N/A 03/19/2016   Procedure: TRANSESOPHAGEAL ECHOCARDIOGRAM (TEE);  Surgeon: Larey Dresser, MD;  Location: [facility];  Service: Cardiovascular;  Laterality: N/A;  . TOTAL KNEE ARTHROPLASTY Right 09/23/2013   Procedure: TOTAL KNEE ARTHROPLASTY;  Surgeon: Ninetta Lights, MD;  Location: [facility];  Service: Orthopedics;  Laterality: Right;  . TOTAL SHOULDER ARTHROPLASTY  06/28/2011   Procedure: TOTAL SHOULDER ARTHROPLASTY;  Surgeon: Ninetta Lights, MD;  Location: [place];  Service: Orthopedics;  Laterality: Right;  . UVULOPALATOPHARYNGOPLASTY    :  . amLODipine  5 mg Oral Daily  . cholecalciferol  5,000 Units Oral Daily  . dofetilide  250 mcg Oral BID  . irbesartan  150 mg Oral Daily  . magnesium oxide  400 mg Oral Daily  . metoprolol tartrate  25 mg Oral BID  . pantoprazole  40 mg Oral Daily  . PARoxetine  40 mg Oral QHS  . polycarbophil  1,250 mg Oral QHS  . rivaroxaban  20 mg Oral Q supper  . rosuvastatin  5 mg Oral q morning - 10a  . tamsulosin  0.4 mg Oral Daily  :  Allergies  Allergen Reactions  . Tetanus Toxoid Anaphylaxis, Hives and Other (See Comments)    REACTION: hives/throat swells shut  . Fish Oil Other (See Comments)    Stomach cramps, can't eat   . Glucosamine-Chondroitin Other (See Comments)    Stomach  cramps, can't eat  . Ibuprofen Nausea Only and Other (See Comments)    Stomach pains  . Naproxen Diarrhea  :  Family History  Problem Relation Age of Onset  . Cancer Mother        colon  . Heart attack Father   . Migraines Brother   : Hemochromatosis: See HPI  Social History   Socioeconomic History  . Marital status: Married    Spouse name: Not on file  . Number of children: 3  . Years of education: Not on file  . Highest education level: Not on file  Social Needs  . Financial resource strain: Not on file  . Food insecurity - worry: Not on file  . Food insecurity - inability: Not on file  . Transportation needs - medical: Not on file  . Transportation needs - non-medical: Not on file  Occupational History  . Occupation: retired  Tobacco Use  . Smoking status: Former Smoker    Packs/day: 2.00    Years: 30.00  Pack years: 60.00    Types: Cigarettes    Last attempt to quit: 02/27/1998    Years since quitting: 19.1  . Smokeless tobacco: Never Used  Substance and Sexual Activity  . Alcohol use: Yes    Alcohol/week: 8.4 oz    Types: 14 Standard drinks or equivalent per week    Comment: daily  . Drug use: No  . Sexual activity: Not on file  Other Topics Concern  . Not on file  Social History Narrative   Patient is left handed.   Patient drinks one cup caffeine daily.  :  ROS: Negative except as noted in the HPI.  Vitals: Vitals:   04/17/17 1325 04/17/17 1648  BP: (!) 112/45 (!) 114/52  Pulse: 68 70  Resp: 18 18  Temp:  98.2 F (36.8 C)  SpO2: 98% 99%    PHYSICAL EXAM: General appearance: Well-nourished Caucasian man in no distress HEENT: Pharynx no erythema exudate or mass Lymph Nodes: No cervical supraclavicular or axillary adenopathy Resp: Lungs clear to auscultation and resonant to percussion throughout Cardio: Regular cardiac rhythm no murmur gallop or rub.  No JVD.  Trace edema. Vascular: Carotids 2+ no bruits Breasts: GI: Abdomen is soft and  nontender without mass or organomegaly GU: Extremities: No edema or calf tenderness  Neurologic: He is alert and oriented x3, pupils are small round equal and reactive, full extraocular movements, palate elevates symmetrically, no facial asymmetry, motor strength 5/5 upper and lower extremities, reflexes 1+ symmetric at the biceps and the patellae, upper body coordination normal Skin: A few small scattered ecchymoses on the skin of his left forearm.  No hemangiomas on the mucosa of the oropharynx, tongue, or on the fingertips.  Labs:  Recent Labs    04/16/17 1636 04/17/17 0059  WBC 9.4 8.9  HGB 9.0* 9.0*  HCT 31.6* 30.9*  PLT 232 248   Recent Labs    04/15/17 1636 04/15/17 1920  NA 139 139  K 4.1 3.8  CL 104 105  CO2 29 23  GLUCOSE 140* 119*  BUN 18 15  CREATININE 0.89 0.85  CALCIUM 8.7 8.8*  Liver functions normal total bilirubin 0.5, left ear T12, ALT 9, alkaline phosphatase 97 Pro time and PTT mildly elevated admission consistent with effect of the Xarelto.  16.5 and 37 seconds respectively.  Coag studies back in January 2015 prior to Xarelto were normal with pro time 13.3 seconds and PTT 23 seconds.    Images Studies/Results:  Dg Chest 2 View  Result Date: 04/16/2017 CLINICAL DATA:  Chronic progressive shortness of breath. Generalized weakness and fatigue. EXAM: CHEST  2 VIEW COMPARISON:  CT of the chest performed 04/09/2017 FINDINGS: The lungs are well-aerated. Mild peribronchial thickening is noted. Chronic lung changes are again seen. There is no evidence of focal opacification, pleural effusion or pneumothorax. The heart is borderline enlarged. No acute osseous abnormalities are seen. Bilateral hip arthroplasties are incompletely imaged, but appear grossly unremarkable. IMPRESSION: Mild peribronchial thickening noted. Chronic lung changes again seen. No superimposed focal airspace consolidation identified. Electronically Signed   By: Garald Balding M.D.   On: 04/16/2017  01:47   Ct Abdomen Pelvis W Contrast  Result Date: 04/16/2017 CLINICAL DATA:  Acute onset of shortness of breath. Generalized weakness and fatigue. EXAM: CT ABDOMEN AND PELVIS WITH CONTRAST TECHNIQUE: Multidetector CT imaging of the abdomen and pelvis was performed using the standard protocol following bolus administration of intravenous contrast. CONTRAST:  145mL ISOVUE-300 IOPAMIDOL (ISOVUE-300) INJECTION 61% COMPARISON:  Retroperitoneal  ultrasound performed 09/23/2008 FINDINGS: Lower chest: Scattered coronary artery calcifications are seen. Scattered peripheral blebs are noted at the lung bases, with associated scarring. Hepatobiliary: The liver is unremarkable in appearance. A stone is seen within the gallbladder. The gallbladder is otherwise unremarkable. The common bile duct remains normal in caliber. Pancreas: The pancreas is within normal limits. Spleen: The spleen is unremarkable in appearance. Adrenals/Urinary Tract: The adrenal glands are unremarkable in appearance. Mild nonspecific perinephric stranding is noted bilaterally. A nonobstructing 5 mm stone is noted at the upper pole of the left kidney. There no evidence of hydronephrosis. No obstructing ureteral stones are identified. Stomach/Bowel: The stomach is unremarkable in appearance. The small bowel is within normal limits. The appendix is not visualized; there is no evidence for appendicitis. Scattered diverticulosis is noted along the descending and sigmoid colon, without evidence of diverticulitis. Vascular/Lymphatic: Diffuse calcification is seen along the abdominal aorta and its branches. The abdominal aorta is otherwise grossly unremarkable. The inferior vena cava is grossly unremarkable. No retroperitoneal lymphadenopathy is seen. No pelvic sidewall lymphadenopathy is identified. Reproductive: The bladder is mildly distended and grossly unremarkable. The prostate remains normal in size. Other: No additional soft tissue abnormalities are  seen. Musculoskeletal: No acute osseous abnormalities are identified. There is grade 1 anterolisthesis of L4 on L5, reflecting underlying facet disease. Associated vacuum phenomenon is noted. The visualized musculature is unremarkable in appearance. IMPRESSION: 1. No acute abnormality seen to explain the patient's symptoms. 2. Cholelithiasis.  Gallbladder otherwise unremarkable. 3. Nonobstructing 5 mm stone at the upper pole of the left kidney. 4. Scattered diverticulosis along the descending and sigmoid colon, without evidence of diverticulitis. 5. Scattered peripheral blebs at the lung bases, with associated scarring. 6. Scattered coronary artery calcifications seen. Aortic Atherosclerosis (ICD10-I70.0). Electronically Signed   By: Garald Balding M.D.   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  Likely additional lesions in other areas of his GI tract.  No constitutional symptoms or obstructive symptoms to suggest small bowel neoplasm.  Whether or not to do capsule endoscopy is a judgment call which I will defer to gastroenterology. A transesophageal echocardiogram done January 2018 showed a normal aortic valve so I do not think that he has acquired von Willebrand's disease. He has had multiple prior surgeries without excessive bleeding and normal baseline coagulation studies so again no evidence that he has a coagulopathy either von Willebrand's or non-von Willebrand's. He has normal liver function. He is a heterozygote for the hemochromatosis gene which should be a factor in his favor with respect to iron absorption.  Situation complicated by need for ongoing anticoagulation for persistent atrial fibrillation and prior history of suspected embolic stroke related to this.    Recommendation: He is 77 years old.  Although his renal function is normal  on paper, it is probably mildly reduced because of his age.  There appears to be an age-related increase in the risk of GI bleeding on the new oral anticoagulants.  I think that we could safely dose reduce his Xarelto to 15 mg daily and he would still have adequate protection against recurrent stroke.  This would decrease his bleeding risk on the drug. I am going to order him a dose of parenteral iron Feraheme 510 mg today.  A follow-up dose can be given as an outpatient. I would monitor a CBC and ferritin  every 3 months.  Give additional parenteral iron on an as-needed basis. I will schedule a follow-up visit in my office.   Thank you for this consultation  Murriel Hopper, MD, [place]  Hematology-Oncology/Internal Medicine  04/17/2017, 5:34 PM

## 2017-04-17 NOTE — Progress Notes (Signed)
  Echocardiogram 2D Echocardiogram has been performed.  George Vega 04/17/2017, 3:53 PM

## 2017-04-17 NOTE — Anesthesia Preprocedure Evaluation (Signed)
Anesthesia Evaluation  Patient identified by MRN, date of birth, ID band Patient awake    Reviewed: Allergy & Precautions, H&P , NPO status , Patient's Chart, lab work & pertinent test results  History of Anesthesia Complications (+) DIFFICULT AIRWAY and history of anesthetic complications  Airway Mallampati: II   Neck ROM: full    Dental   Pulmonary former smoker,    breath sounds clear to auscultation       Cardiovascular hypertension, + CAD and + Peripheral Vascular Disease  + dysrhythmias Atrial Fibrillation  Rhythm:regular Rate:Normal  Takes xarelto   Neuro/Psych  Headaches, PSYCHIATRIC DISORDERS Depression CVA    GI/Hepatic   Endo/Other    Renal/GU      Musculoskeletal  (+) Arthritis ,   Abdominal   Peds  Hematology  (+) anemia ,   Anesthesia Other Findings   Reproductive/Obstetrics                             Anesthesia Physical Anesthesia Plan  ASA: III  Anesthesia Plan: MAC   Post-op Pain Management:    Induction: Intravenous  PONV Risk Score and Plan: 1 and Propofol infusion and Treatment may vary due to age or medical condition  Airway Management Planned: Nasal Cannula  Additional Equipment:   Intra-op Plan:   Post-operative Plan:   Informed Consent: I have reviewed the patients History and Physical, chart, labs and discussed the procedure including the risks, benefits and alternatives for the proposed anesthesia with the patient or authorized representative who has indicated his/her understanding and acceptance.     Plan Discussed with: CRNA, Anesthesiologist and Surgeon  Anesthesia Plan Comments:         Anesthesia Quick Evaluation

## 2017-04-17 NOTE — Progress Notes (Signed)
PROGRESS NOTE    George Vega  DJT:[id number] DOB: 26-Oct-1940 DOA: 04/15/2017 PCP: [name], MD   Chief Complaint  Patient presents with  . Shortness of Breath    Brief Narrative:  HPI On 04/16/2017 by Dr. Gean Birchwood ORLANDIS SANDEN is a 77 y.o. male with history of atrial fibrillation, CAD, hypertension, stroke was referred to the ER after patient's hemoglobin was found to be low at around 7.  Patient has been having chronic exertional shortness of breath and has been following with cardiology and pulmonary.  Blood work done showed low hemoglobin and patient had hemoglobin around 14 last year January 2018.  Patient denies noticing any GI bleed or black stools.  Denies using any NSAIDs.  Patient is on Xarelto.  Interim history Admitted for symptomatic anemia.  Gastroenterology consult appreciated. Assessment & Plan   Symptomatic anemia -Hemoglobin on admission 6.9, patient was given 2 units PRBC -Hemoglobin currently 9 -Patient presented with shortness of breath and weakness -Gastroenterology consulted and appreciated -EGD showed 2 cm hiatal hernia, normal esophagus. 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 -Colonoscopy:, Treated with APC.  Diverticulosis sigmoid colon and descending colon.  Tattoo was seen in the distal transverse colon.  Internal hemorrhoids. 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 3 nonbleeding colonic angiodysplastic lesions -Gastroenterology recommended clear liquid diet advance as tolerated evening.  May resume Xarelto this afternoon.  Oral iron sulfate 325 mg twice daily.  Discharge 04/18/2017. -Hematology, Dr. Beryle Beams, consulted and appreciated (requested by patient's wife)  Dyspnea on exertion -Suspect secondary to symptomatic anemia -Patient has been having shortness of breath for quite some time now, along with lower extremity edema    -Will order echocardiogram to evaluate for possible CHF  Atrial fibrillation, persistent -Xarelto currently held- will restart today -Patient was placed on heparin -Continue Tikosyn -Currently appears to be rate and rhythm controlled  Essential hypertension -Continue Norvasc, metoprolol, Cozaar  History of CAD -Currently chest pain-free, continue statin, metoprolol  Pulmonary nodule -Being followed by pulmonologist  DVT Prophylaxis restart Xarelto  Code Status: Full  Family Communication: Wife at bedside  Disposition Plan: Admitted.  Likely will discharge on 04/18/2017 to home  Consultants Gastroenterology Hematology, Dr. Beryle Beams  Procedures  EGD Colonoscopy  Antibiotics   Anti-infectives (From admission, onward)   None      Subjective:   [name] seen and examined today.  Complains of lower extremity edema and shortness of breath.  States shortness of breath and fatigue have been ongoing for quite some time, possibly a couple of years.  Denies current chest pain, abdominal pain, nausea vomiting, dizziness or headache.  Objective:   Vitals:   04/17/17 0810 04/17/17 1035 04/17/17 1240 04/17/17 1250  BP: (!) 106/55 (!) 154/74 (!) 106/50 114/62  Pulse: 73 76 77 72  Resp: 16 20 17 20   Temp: 98.2 F (36.8 C) 98 F (36.7 C) 97.7 F (36.5 C)   TempSrc: Oral Oral Oral   SpO2: 96% 98% 94% 95%  Weight:  98.9 kg (218 lb)    Height:  5\' 9"  (1.753 m)      Intake/Output Summary (Last 24 hours) at 04/17/2017 1257 Last data filed at 04/17/2017 1240 Gross per 24 hour  Intake 471.25 ml  Output -  Net 471.25 ml   Filed Weights   04/15/17 1853 04/16/17 2035 04/17/17 1035  Weight: 100.7 kg (222 lb) 98.9 kg (218 lb) 98.9 kg (218  lb)    Exam  General: Well developed, well nourished, NAD, appears stated age. Pale  HEENT: NCAT,  mucous membranes moist.   Cardiovascular: S1 S2 auscultated, no rubs, murmurs or gallops. Regular rate and rhythm.  Respiratory:  Clear to auscultation bilaterally with equal chest rise  Abdomen: Soft, nontender, nondistended, + bowel sounds  Extremities: warm dry without cyanosis clubbing. LE edema B/L  Neuro: AAOx3, nonfocal  Psych: Normal affect and demeanor with intact judgement and insight   Data Reviewed: I have personally reviewed following labs and imaging studies  CBC: Recent Labs  Lab 04/15/17 1636 04/15/17 1920 04/16/17 1636 04/17/17 0059  WBC 8.6 8.3 9.4 8.9  NEUTROABS 5.4  --   --   --   HGB 6.9 Repeated and verified X2.* 7.0* 9.0* 9.0*  HCT 23.4 Repeated and verified X2.* 25.5* 31.6* 30.9*  MCV 68.6 aL* 73.9* 76.1* 76.3*  PLT 250.0 250 232 093   Basic Metabolic Panel: Recent Labs  Lab 04/15/17 1636 04/15/17 1920  NA 139 139  K 4.1 3.8  CL 104 105  CO2 29 23  GLUCOSE 140* 119*  BUN 18 15  CREATININE 0.89 0.85  CALCIUM 8.7 8.8*   GFR: Estimated Creatinine Clearance: 85.8 mL/min (by C-G formula based on SCr of 0.85 mg/dL). Liver Function Tests: Recent Labs  Lab 04/15/17 1636  AST 12  ALT 9  ALKPHOS 97  BILITOT 0.5  PROT 6.7  ALBUMIN 3.8   No results for input(s): LIPASE, AMYLASE in the last 168 hours. No results for input(s): AMMONIA in the last 168 hours. Coagulation Profile: Recent Labs  Lab 04/16/17 0605  INR 1.35   Cardiac Enzymes: No results for input(s): CKTOTAL, CKMB, CKMBINDEX, TROPONINI in the last 168 hours. BNP (last 3 results) Recent Labs    01/07/17 1517 04/15/17 1636  PROBNP 309 117.0*   HbA1C: No results for input(s): HGBA1C in the last 72 hours. CBG: No results for input(s): GLUCAP in the last 168 hours. Lipid Profile: No results for input(s): CHOL, HDL, LDLCALC, TRIG, CHOLHDL, LDLDIRECT in the last 72 hours. Thyroid Function Tests: Recent Labs    04/15/17 1636  TSH 1.43   Anemia Panel: No results for input(s): VITAMINB12, FOLATE, FERRITIN, TIBC, IRON, RETICCTPCT in the last 72 hours. Urine analysis:    Component Value Date/Time    COLORURINE YELLOW 04/16/2017 0238   APPEARANCEUR CLEAR 04/16/2017 0238   LABSPEC 1.023 04/16/2017 0238   PHURINE 5.0 04/16/2017 0238   GLUCOSEU NEGATIVE 04/16/2017 0238   HGBUR NEGATIVE 04/16/2017 0238   BILIRUBINUR NEGATIVE 04/16/2017 0238   KETONESUR NEGATIVE 04/16/2017 0238   PROTEINUR NEGATIVE 04/16/2017 0238   UROBILINOGEN 0.2 02/05/2014 1628   NITRITE NEGATIVE 04/16/2017 0238   LEUKOCYTESUR NEGATIVE 04/16/2017 0238   Sepsis Labs: @LABRCNTIP (procalcitonin:4,lacticidven:4)  )No results found for this or any previous visit (from the past 240 hour(s)).    Radiology Studies: Dg Chest 2 View  Result Date: 04/16/2017 CLINICAL DATA:  Chronic progressive shortness of breath. Generalized weakness and fatigue. EXAM: CHEST  2 VIEW COMPARISON:  CT of the chest performed 04/09/2017 FINDINGS: The lungs are well-aerated. Mild peribronchial thickening is noted. Chronic lung changes are again seen. There is no evidence of focal opacification, pleural effusion or pneumothorax. The heart is borderline enlarged. No acute osseous abnormalities are seen. Bilateral hip arthroplasties are incompletely imaged, but appear grossly unremarkable. IMPRESSION: Mild peribronchial thickening noted. Chronic lung changes again seen. No superimposed focal airspace consolidation identified. Electronically Signed   By: Jacqulynn Cadet  Chang M.D.   On: 04/16/2017 01:47   Ct Abdomen Pelvis W Contrast  Result Date: 04/16/2017 CLINICAL DATA:  Acute onset of shortness of breath. Generalized weakness and fatigue. EXAM: CT ABDOMEN AND PELVIS WITH CONTRAST TECHNIQUE: Multidetector CT imaging of the abdomen and pelvis was performed using the standard protocol following bolus administration of intravenous contrast. CONTRAST:  157mL ISOVUE-300 IOPAMIDOL (ISOVUE-300) INJECTION 61% COMPARISON:  Retroperitoneal ultrasound performed 09/23/2008 FINDINGS: Lower chest: Scattered coronary artery calcifications are seen. Scattered peripheral blebs are  noted at the lung bases, with associated scarring. Hepatobiliary: The liver is unremarkable in appearance. A stone is seen within the gallbladder. The gallbladder is otherwise unremarkable. The common bile duct remains normal in caliber. Pancreas: The pancreas is within normal limits. Spleen: The spleen is unremarkable in appearance. Adrenals/Urinary Tract: The adrenal glands are unremarkable in appearance. Mild nonspecific perinephric stranding is noted bilaterally. A nonobstructing 5 mm stone is noted at the upper pole of the left kidney. There no evidence of hydronephrosis. No obstructing ureteral stones are identified. Stomach/Bowel: The stomach is unremarkable in appearance. The small bowel is within normal limits. The appendix is not visualized; there is no evidence for appendicitis. Scattered diverticulosis is noted along the descending and sigmoid colon, without evidence of diverticulitis. Vascular/Lymphatic: Diffuse calcification is seen along the abdominal aorta and its branches. The abdominal aorta is otherwise grossly unremarkable. The inferior vena cava is grossly unremarkable. No retroperitoneal lymphadenopathy is seen. No pelvic sidewall lymphadenopathy is identified. Reproductive: The bladder is mildly distended and grossly unremarkable. The prostate remains normal in size. Other: No additional soft tissue abnormalities are seen. Musculoskeletal: No acute osseous abnormalities are identified. There is grade 1 anterolisthesis of L4 on L5, reflecting underlying facet disease. Associated vacuum phenomenon is noted. The visualized musculature is unremarkable in appearance. IMPRESSION: 1. No acute abnormality seen to explain the patient's symptoms. 2. Cholelithiasis.  Gallbladder otherwise unremarkable. 3. Nonobstructing 5 mm stone at the upper pole of the left kidney. 4. Scattered diverticulosis along the descending and sigmoid colon, without evidence of diverticulitis. 5. Scattered peripheral blebs at  the lung bases, with associated scarring. 6. Scattered coronary artery calcifications seen. Aortic Atherosclerosis (ICD10-I70.0). Electronically Signed   By: Garald Balding M.D.   On: 04/16/2017 02:09     Scheduled Meds: . [MAR Hold] amLODipine  5 mg Oral Daily  . [MAR Hold] cholecalciferol  5,000 Units Oral Daily  . [MAR Hold] dofetilide  250 mcg Oral BID  . [MAR Hold] irbesartan  150 mg Oral Daily  . [MAR Hold] magnesium oxide  400 mg Oral Daily  . [MAR Hold] metoprolol tartrate  25 mg Oral BID  . [MAR Hold] pantoprazole  40 mg Oral Daily  . [MAR Hold] PARoxetine  40 mg Oral QHS  . [MAR Hold] polycarbophil  1,250 mg Oral QHS  . [MAR Hold] rosuvastatin  5 mg Oral q morning - 10a  . [MAR Hold] tamsulosin  0.4 mg Oral Daily   Continuous Infusions: . lactated ringers 20 mL/hr at 04/17/17 1051     LOS: 1 day   Time Spent in minutes   30 minutes  Davielle Lingelbach D.O. on 04/17/2017 at 12:57 PM  Between 7am to 7pm - Pager - [phone]  After 7pm go to [email or web address] - password [email or web address]  And look for the night coverage person covering for me after hours  [facility] Office  [phone]

## 2017-04-17 NOTE — Op Note (Signed)
[facility] Patient Name: George Vega Procedure Date : 04/17/2017 MRN: [id number] Attending MD: Carlota Raspberry. Cigi Bega MD, MD Date of Birth: 09-16-1940 CSN: [id number] Age: 77 Admit Type: Inpatient Procedure:                Upper GI endoscopy Indications:              suspected occult blood in stool, microcytic anemia                            on Xarelto for history of CVA Providers:                Remo Lipps P. Saher Davee MD, MD, Cleda Daub, RN,                            William Dalton, Technician Referring MD:              Medicines:                Monitored Anesthesia Care Complications:            No immediate complications. Estimated blood loss:                            Minimal. Estimated Blood Loss:     Estimated blood loss was minimal. Procedure:                Pre-Anesthesia Assessment:                           - Prior to the procedure, a History and Physical                            was performed, and patient medications and                            allergies were reviewed. The patient's tolerance of                            previous anesthesia was also reviewed. The risks                            and benefits of the procedure and the sedation                            options and risks were discussed with the patient.                            All questions were answered, and informed consent                            was obtained. Prior Anticoagulants: The patient has                            taken heparin, last dose was day of procedure. ASA  Grade Assessment: III - A patient with severe                            systemic disease. After reviewing the risks and                            benefits, the patient was deemed in satisfactory                            condition to undergo the procedure.                           After obtaining informed consent, the endoscope was                            passed under  direct vision. Throughout the                            procedure, the patient's blood pressure, pulse, and                            oxygen saturations were monitored continuously. The                            [id number] ([id number]) scope was introduced through the                            mouth, and advanced to the second part of duodenum.                            The upper GI endoscopy was accomplished without                            difficulty. The patient tolerated the procedure                            well. Scope In: Scope Out: Findings:      Esophagogastric landmarks were identified: the Z-line was found at 42       cm, the gastroesophageal junction was found at 42 cm and the upper       extent of the gastric folds was found at 44 cm from the incisors. 2cm       hiatal hernia      Mild shatski ring at the GEJ. The exam of the esophagus was otherwise       normal.      Two 2 to 4 mm no bleeding angiodysplastic lesions were found in the       gastric antrum / incisura. Fulguration to ablate the lesion to prevent       bleeding by argon plasma was successful.      The exam of the stomach was otherwise normal.      The duodenal bulb and second portion of the duodenum were normal. Impression:               - Esophagogastric landmarks identified.                           -  2cm hiatal hernia                           - Normal esophagus                           - Two non-bleeding angiodysplastic lesions in the                            stomach. Treated with argon plasma coagulation                            (APC).                           - Normal duodenal bulb and second portion of the                            duodenum.                           Suspect anemia is related to AVMs noted in the                            stomach and colon on these exams, ablated with APC Moderate Sedation:      No moderate sedation, case performed with MAC Recommendation:           -  Recommendations per colonoscopy note                           - Continue protonix Procedure Code(s):        --- Professional ---                           97588, Esophagogastroduodenoscopy, flexible,                            transoral; with control of bleeding, any method Diagnosis Code(s):        --- Professional ---                           T25.498, Angiodysplasia of stomach and duodenum                            without bleeding                           R19.5, Other fecal abnormalities CPT copyright 2016 American [employer] Association. All rights reserved. The codes documented in this report are preliminary and upon coder review may  be revised to meet current compliance requirements. Remo Lipps P. Darrin Apodaca MD, MD 04/17/2017 12:46:29 PM This report has been signed electronically. Number of Addenda: 0

## 2017-04-18 DIAGNOSIS — D500 Iron deficiency anemia secondary to blood loss (chronic): Secondary | ICD-10-CM

## 2017-04-18 DIAGNOSIS — Q2733 Arteriovenous malformation of digestive system vessel: Secondary | ICD-10-CM

## 2017-04-18 LAB — CBC
HEMATOCRIT: 30.2 % — AB (ref 39.0–52.0)
Hemoglobin: 8.6 g/dL — ABNORMAL LOW (ref 13.0–17.0)
MCH: 22.1 pg — AB (ref 26.0–34.0)
MCHC: 28.5 g/dL — AB (ref 30.0–36.0)
MCV: 77.6 fL — AB (ref 78.0–100.0)
Platelets: 253 10*3/uL (ref 150–400)
RBC: 3.89 MIL/uL — ABNORMAL LOW (ref 4.22–5.81)
RDW: 19.9 % — ABNORMAL HIGH (ref 11.5–15.5)
WBC: 8.2 10*3/uL (ref 4.0–10.5)

## 2017-04-18 MED ORDER — FERROUS SULFATE 325 (65 FE) MG PO TABS: 325.0000 mg | ORAL_TABLET | Freq: Two times a day (BID) | ORAL | Status: DC

## 2017-04-18 MED ORDER — RIVAROXABAN 15 MG PO TABS: 15.0000 mg | ORAL_TABLET | Freq: Every day | ORAL | Status: DC

## 2017-04-18 MED ORDER — FERROUS SULFATE 325 (65 FE) MG PO TBEC: 325.0000 mg | DELAYED_RELEASE_TABLET | Freq: Two times a day (BID) | ORAL | 0 refills | Status: DC

## 2017-04-18 MED ORDER — RIVAROXABAN 15 MG PO TABS: 15.0000 mg | ORAL_TABLET | Freq: Every day | ORAL | 0 refills | Status: DC

## 2017-04-18 NOTE — Telephone Encounter (Signed)
Late entry:  Returned daughter call on 04/16/2017.  Daughter wanted Dr. Lovena Le to be aware her father was at [facility].  Notified Dr. Lovena Le.  No further action needed.

## 2017-04-18 NOTE — Discharge Instructions (Signed)
Gastrointestinal Arteriovenous Malformations A gastrointestinal arteriovenous malformation is a rare defect of tangled veins and arteries involving the intestine. The defect can occur anywhere in the intestine. The defect creates an abnormal collection of blood vessels that can put pressure on other parts of your body that are close by. It causes blood vessels to expand (dilate) over time and sometimes bleed. Many people with this defect never have any symptoms. This defect is present at birth (congenital). What are the signs or symptoms? Signs and symptoms often do not appear until you are older and may include:  Blood in your stool.  Black or dark red stools.  Weakness.  Tiredness.  Shortness of breath.  Iron deficiency anemia.  How is this diagnosed? The following imaging exams may be used to help in diagnosis:  X-ray.  CT scan.  Angiogram.  Endoscopy.  Enteroscopy.  Capsule endoscopy.  How is this treated? The goal of treatment is to deal with blood loss and prevent future bleeding. Possible treatments include:  Minimally invasive procedures to stop the bleeding or destroy the affected vessels.  Abdominal surgery to remove the affected section of the digestive system.  Blood transfusion to replace significant blood loss.  Iron supplementation for iron deficiency anemia.  Follow these instructions at home:  Take all medicines as directed by your health care provider.  Follow up with your health care provider as directed. Contact a health care provider if:  You notice chronic streaks of blood in your stool.  You have black or deep red stool.  You have signs of anemia such as weakness, fatigue, or shortness of breath.  You notice other new symptoms. Get help right away if:  You have heavy bleeding during a bowel movement. This information is not intended to replace advice given to you by your health care provider. Make sure you discuss any questions you  have with your health care provider. Document Released: 03/03/2013 Document Revised: 08/04/2015 Document Reviewed: 01/29/2013 Elsevier Interactive Patient Education  2017 [employer].

## 2017-04-18 NOTE — Care Management Note (Signed)
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  PCP Dr. Melford Aase.  Patent attorney .  Expected Discharge Date:  04/18/17               Expected Discharge Plan:  Home/Self Care  Discharge planning Services  CM Consult  Status of Service:  Completed, signed off  Additional Comments:  Patient with discharge orders today.  No discharge needs noted.    Kristen Cardinal, RN  Nurse case manager [phone] 04/18/2017, 10:39 AM

## 2017-04-18 NOTE — Discharge Summary (Signed)
Physician Discharge Summary  George Vega HQI:[id number] DOB: 04/15/1940 DOA: 04/15/2017  PCP: [name], MD  Admit date: 04/15/2017 Discharge date: 04/18/2017  Time spent: 45 minutes  Recommendations for Outpatient Follow-up:  Patient will be discharged to home.  Patient will need to follow up with primary care provider within one week of discharge.  Follow-up with Dr. Beryle Beams, hematologist.  Follow-up with Dr. Havery Moros, gastroenterologist.  Patient should continue medications as prescribed.  Patient should follow a heart healthy diet.   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 Tana Felts Condonis a 77 y.o.malewithhistory of atrial fibrillation, CAD, hypertension, stroke was referred to the ER after patient's hemoglobin was found to be low at around 7. Patient has been having chronic exertional shortness of breath and has been following with cardiology and pulmonary. Blood work done showed low hemoglobin and patient had hemoglobin around 14 last year January 2018. Patient denies noticing any GI bleed or black stools. Denies using any NSAIDs. Patient is on Xarelto.  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 -Colonoscopy:, Treated with APC.  Diverticulosis sigmoid colon and descending colon.  Tattoo was seen in the distal transverse colon.  Internal hemorrhoids. 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 3 nonbleeding colonic angiodysplastic lesions -Gastroenterology recommended clear liquid diet advance as tolerated evening.  May resume Xarelto this afternoon.  Oral iron sulfate 325 mg twice daily.  Discharge 04/18/2017. -Hematology, Dr. Beryle Beams consulted and appreciated.  Recommended continuing Xarelto at 15 mg daily.  Patient was given 1 dose of Feraheme and will be given another dose as an outpatient.  Patient will need to follow-up with Dr. Beryle Beams as an outpatient.  Dyspnea on exertion/chronic diastolic heart failure -Suspect secondary to symptomatic anemia -Patient has been having shortness of breath for quite some time now, along with lower extremity edema  -Echocardiogram showed EF of 13-24%, grade 2 diastolic dysfunction -Patient does have Lasix at home which he takes as needed for fluid  Atrial fibrillation, persistent -Xarelto restarted on 04/17/2017 (per hematology,continue Xarelto 15 mg) -Continue Tikosyn -Currently appears to be rate and rhythm controlled  Essential hypertension -Continue Norvasc, metoprolol, Cozaar  History of CAD -Currently chest pain-free, continue statin, metoprolol  Pulmonary nodule -Being followed by pulmonologist  Consultants Gastroenterology Hematology, Dr. Beryle Beams  Procedures  EGD Colonoscopy  Discharge Exam: Vitals:   04/18/17 0510 04/18/17 0951  BP: 131/67 (!) 144/75  Pulse: 78 79  Resp: 18 18  Temp: 98 F (36.7 C) 98.3 F (36.8 C)  SpO2: 94% 97%     General: Well developed, well nourished, NAD, appears stated age  HEENT: NCAT, mucous membranes moist.  Cardiovascular: S1 S2 auscultated, no rubs, murmurs or gallops. Regular  rate and rhythm.  Respiratory: Clear to auscultation bilaterally with equal chest rise  Abdomen: Soft, nontender, nondistended, + bowel sounds  Extremities: warm dry without cyanosis clubbing.  Improving lower extremity edema  Neuro: AAOx3, nonfocal  Psych: appropriate mood and affect, pleasant  Discharge Instructions Discharge Instructions    Discharge instructions   Complete by:  As directed    Patient will be discharged to home.  Patient will need to follow up with primary care provider within one week of discharge.  Follow-up with Dr. Beryle Beams, hematologist.  Follow-up with Dr. Havery Moros, gastroenterologist.  Patient should continue medications as prescribed.  Patient should follow a heart healthy diet.   You may use your Lasix as needed for lower extremity edema.  Continue to use potassium with your Lasix.  Follow-up with your cardiologist.     Allergies as of 04/18/2017      Reactions   Tetanus Toxoid Anaphylaxis, Hives, Other (See Comments)   REACTION: hives/throat swells shut   Fish Oil Other (See Comments)   Stomach cramps, can't eat   Glucosamine-chondroitin Other (See Comments)   Stomach cramps, can't eat   Ibuprofen Nausea Only, Other (See Comments)   Stomach pains   Naproxen Diarrhea      Medication List    TAKE these medications   acetaminophen 325 MG tablet Commonly known as:  TYLENOL Take 650 mg by mouth every 6 (six) hours as needed for moderate pain.   amLODipine 5 MG tablet Commonly known as:  NORVASC TAKE 1 TABLET BY MOUTH  DAILY   dofetilide 250 MCG capsule Commonly known as:  TIKOSYN TAKE 1 CAPSULE BY MOUTH TWO TIMES DAILY   ferrous sulfate 325 (65 FE) MG EC tablet Take 1 tablet (325 mg total) by mouth 2 (two) times daily.   furosemide 40 MG tablet Commonly known as:  LASIX Take 1 tablet (40 mg total) by mouth as directed. Take 1 tablet daily for 3 days then take only as needed What changed:    when to take this  reasons to take  this  additional instructions   irbesartan 150 MG tablet Commonly known as:  AVAPRO TAKE 1 TABLET(150 MG) BY MOUTH DAILY   LORazepam 0.5 MG tablet Commonly known as:  ATIVAN Take 1/2 to 1 tablet daily as needed for acute anxiety. Limit use. Do no mix w/ trazodone.   magnesium oxide 400 MG tablet Commonly known as:  MAG-OX Take 400 mg by mouth daily.   Melatonin 10 MG Tabs Take 10 mg by mouth at bedtime as needed (sleep).   metoprolol tartrate 25 MG tablet Commonly known as:  LOPRESSOR TAKE 1 TABLET BY MOUTH TWO  TIMES DAILY   pantoprazole 40 MG tablet Commonly known as:  PROTONIX TAKE 1 TABLET(40 MG) BY MOUTH DAILY   PARoxetine 30 MG tablet Commonly known as:  PAXIL Take 40 mg by mouth at bedtime.   polycarbophil 625 MG tablet Commonly known as:  FIBERCON Take 1,250 mg by mouth at bedtime.   potassium chloride 10 MEQ tablet Commonly known as:  K-DUR Take 1 tablet (10 mEq total) by mouth as directed. Take 1 tablet daily for 3 days; then only take when you take the lasix as needed What changed:    when to take this  reasons to take this  additional instructions   PRESERVISION AREDS 2 PO Take 1 capsule by mouth 2 (two) times daily.   Rivaroxaban 15 MG Tabs tablet Commonly known as:  XARELTO Take 1 tablet (15 mg total) by mouth daily with supper. What changed:    medication strength  See the new instructions.   rosuvastatin 5 MG tablet Commonly known as:  CRESTOR Take 1 tablet (5 mg total) by mouth every morning.   SYSTANE OP Place 2 drops into both eyes 2 (two) times daily as needed (dry eyes).   tamsulosin 0.4 MG Caps capsule Commonly known as:  FLOMAX Take 0.4 mg by mouth daily.   traZODone 50 MG tablet Commonly known as:  DESYREL TAKE 1/2 TO 1 TABLET BY MOUTH AS NEEDED FOR SLEEP   Vitamin B-12 500 MCG Subl Place 500 mcg under the tongue 3 (three) times a week.   Vitamin D3 5000 units Tabs Take 5,000 Units by mouth daily.      Allergies   Allergen Reactions  . Tetanus Toxoid Anaphylaxis, Hives and Other (See Comments)    REACTION: hives/throat swells shut  . Fish Oil Other (See Comments)    Stomach cramps, can't eat   . Glucosamine-Chondroitin Other (See Comments)    Stomach cramps, can't eat  . Ibuprofen Nausea Only and Other (See Comments)    Stomach pains  . Naproxen Diarrhea   Follow-up Information    [name], MD. Schedule an appointment as soon as possible for a visit in 1 week(s).   Specialty:  Family Medicine Why:  Hospital follow-up Contact information: [place] [address] [phone]        Annia Belt, MD. Schedule an appointment as soon as possible for a visit in 1 week(s).   Specialty:  Oncology Why:  Hospital follow-up Contact information: [place] [address] [phone]        Yetta Flock, MD. Call in 1 week(s).   Specialty:  Gastroenterology Why:  Hospital follow-up Contact information: [address] [place] [address] [phone]            The results of significant diagnostics from this hospitalization (including imaging, microbiology, ancillary and laboratory) are listed below for reference.    Significant Diagnostic Studies: Dg Chest 2 View  Result Date: 04/16/2017 CLINICAL DATA:  Chronic progressive shortness of breath. Generalized weakness and fatigue. EXAM: CHEST  2 VIEW COMPARISON:  CT of the chest performed 04/09/2017 FINDINGS: The lungs are well-aerated. Mild peribronchial thickening is noted. Chronic lung changes are again seen. There is no evidence of focal opacification, pleural effusion or pneumothorax. The heart is borderline enlarged. No acute osseous abnormalities are seen. Bilateral hip arthroplasties are incompletely imaged, but appear grossly unremarkable. IMPRESSION: Mild peribronchial thickening noted. Chronic lung changes again seen. No superimposed focal airspace  consolidation identified. Electronically Signed   By: Garald Balding M.D.   On: 04/16/2017 01:47   Ct Chest Wo Contrast  Result Date: 04/09/2017 CLINICAL DATA:  77 year old male with history of pulmonary nodule. Former smoker. Followup study. EXAM: CT CHEST WITHOUT CONTRAST TECHNIQUE: Multidetector CT imaging of the chest was performed following the standard protocol without IV contrast. COMPARISON:  Chest CT 10/03/2016. FINDINGS: Cardiovascular: Heart size is borderline enlarged. There is no significant pericardial fluid, thickening or pericardial calcification. There is aortic atherosclerosis, as well as atherosclerosis of the great vessels of the mediastinum and the coronary arteries, including calcified atherosclerotic plaque in the left main, left anterior descending, left circumflex and right coronary arteries. Calcifications of the aortic valve and mitral annulus. Mediastinum/Nodes: No pathologically enlarged mediastinal or hilar lymph nodes. Please note that accurate exclusion of hilar adenopathy is limited on noncontrast CT scans. Esophagus is unremarkable in appearance. No axillary lymphadenopathy.  Lungs/Pleura: Previously noted 5 mm lingular nodule (axial image 89 of series 3) is unchanged. Previously noted pleural-based area of nodular architectural distortion in the posterior aspect of the right lower lobe (axial image 74 of series 3) is also unchanged. No other definite new suspicious appearing pulmonary nodules or masses are noted. No acute consolidative airspace disease. No pleural effusions. Diffuse bronchial wall thickening with mild to moderate centrilobular and paraseptal emphysema. Upper Abdomen: Mild diffuse low attenuation throughout the visualized portions of the hepatic parenchyma, indicative of a background of mild hepatic steatosis. Partially calcified gallstone in the fundus of the gallbladder measuring 2.2 cm in diameter. No surrounding inflammatory changes to suggest an acute  cholecystitis at this time. 5 mm nonobstructive calculus in the upper pole collecting system of the left kidney. Aortic atherosclerosis. Musculoskeletal: Status post bilateral shoulder arthroplasty. There are no aggressive appearing lytic or blastic lesions noted in the visualized portions of the skeleton. IMPRESSION: 1. Previously noted nodules are stable compared to the prior study. These are strongly favored to be benign. The nodular area of architectural distortion in the posterior aspect of the right lower lobe is most likely an area of post infectious or inflammatory scarring. Repeat noncontrast chest CT could be considered in 12 months to ensure continued stability. 2. Mild diffuse bronchial wall thickening with mild to moderate centrilobular and paraseptal emphysema. 3. Aortic atherosclerosis, in addition to left main and 3 vessel coronary artery disease. Assessment for potential risk factor modification, dietary therapy or pharmacologic therapy may be warranted, if clinically indicated. 4. There are calcifications of the aortic valve and mitral annulus. Echocardiographic correlation for evaluation of potential valvular dysfunction may be warranted if clinically indicated. 5. Mild hepatic steatosis. 6. Cholelithiasis. 7. 5 mm nonobstructive calculus in the upper pole collecting system of the left kidney. Aortic Atherosclerosis (ICD10-I70.0) and Emphysema (ICD10-J43.9). Electronically Signed   By: Vinnie Langton M.D.   On: 04/09/2017 15:16   Ct Abdomen Pelvis W Contrast  Result Date: 04/16/2017 CLINICAL DATA:  Acute onset of shortness of breath. Generalized weakness and fatigue. EXAM: CT ABDOMEN AND PELVIS WITH CONTRAST TECHNIQUE: Multidetector CT imaging of the abdomen and pelvis was performed using the standard protocol following bolus administration of intravenous contrast. CONTRAST:  153mL ISOVUE-300 IOPAMIDOL (ISOVUE-300) INJECTION 61% COMPARISON:  Retroperitoneal ultrasound performed 09/23/2008  FINDINGS: Lower chest: Scattered coronary artery calcifications are seen. Scattered peripheral blebs are noted at the lung bases, with associated scarring. Hepatobiliary: The liver is unremarkable in appearance. A stone is seen within the gallbladder. The gallbladder is otherwise unremarkable. The common bile duct remains normal in caliber. Pancreas: The pancreas is within normal limits. Spleen: The spleen is unremarkable in appearance. Adrenals/Urinary Tract: The adrenal glands are unremarkable in appearance. Mild nonspecific perinephric stranding is noted bilaterally. A nonobstructing 5 mm stone is noted at the upper pole of the left kidney. There no evidence of hydronephrosis. No obstructing ureteral stones are identified. Stomach/Bowel: The stomach is unremarkable in appearance. The small bowel is within normal limits. The appendix is not visualized; there is no evidence for appendicitis. Scattered diverticulosis is noted along the descending and sigmoid colon, without evidence of diverticulitis. Vascular/Lymphatic: Diffuse calcification is seen along the abdominal aorta and its branches. The abdominal aorta is otherwise grossly unremarkable. The inferior vena cava is grossly unremarkable. No retroperitoneal lymphadenopathy is seen. No pelvic sidewall lymphadenopathy is identified. Reproductive: The bladder is mildly distended and grossly unremarkable. The prostate remains normal in size. Other: No additional soft tissue abnormalities are  seen. Musculoskeletal: No acute osseous abnormalities are identified. There is grade 1 anterolisthesis of L4 on L5, reflecting underlying facet disease. Associated vacuum phenomenon is noted. The visualized musculature is unremarkable in appearance. IMPRESSION: 1. No acute abnormality seen to explain the patient's symptoms. 2. Cholelithiasis.  Gallbladder otherwise unremarkable. 3. Nonobstructing 5 mm stone at the upper pole of the left kidney. 4. Scattered diverticulosis along  the descending and sigmoid colon, without evidence of diverticulitis. 5. Scattered peripheral blebs at the lung bases, with associated scarring. 6. Scattered coronary artery calcifications seen. Aortic Atherosclerosis (ICD10-I70.0). Electronically Signed   By: Garald Balding M.D.   On: 04/16/2017 02:09    Microbiology: No results found for this or any previous visit (from the past 240 hour(s)).   Labs: Basic Metabolic Panel: Recent Labs  Lab 04/15/17 1636 04/15/17 1920  NA 139 139  K 4.1 3.8  CL 104 105  CO2 29 23  GLUCOSE 140* 119*  BUN 18 15  CREATININE 0.89 0.85  CALCIUM 8.7 8.8*   Liver Function Tests: Recent Labs  Lab 04/15/17 1636  AST 12  ALT 9  ALKPHOS 97  BILITOT 0.5  PROT 6.7  ALBUMIN 3.8   No results for input(s): LIPASE, AMYLASE in the last 168 hours. No results for input(s): AMMONIA in the last 168 hours. CBC: Recent Labs  Lab 04/15/17 1636 04/15/17 1920 04/16/17 1636 04/17/17 0059 04/18/17 0733  WBC 8.6 8.3 9.4 8.9 PENDING  NEUTROABS 5.4  --   --   --   --   HGB 6.9 Repeated and verified X2.* 7.0* 9.0* 9.0* 8.6*  HCT 23.4 Repeated and verified X2.* 25.5* 31.6* 30.9* 30.2*  MCV 68.6 aL* 73.9* 76.1* 76.3* 77.6*  PLT 250.0 250 232 248 PENDING   Cardiac Enzymes: No results for input(s): CKTOTAL, CKMB, CKMBINDEX, TROPONINI in the last 168 hours. BNP: BNP (last 3 results) No results for input(s): BNP in the last 8760 hours.  ProBNP (last 3 results) Recent Labs    01/07/17 1517 04/15/17 1636  PROBNP 309 117.0*    CBG: No results for input(s): GLUCAP in the last 168 hours.     Signed:  Cristal Ford  [facility] 04/18/2017, 10:15 AM

## 2017-04-18 NOTE — Progress Notes (Signed)
Patient discharged to home, VS reviewed, IV removed, telebox returned

## 2017-04-18 NOTE — Progress Notes (Signed)
Daily Rounding Note  04/18/2017, 10:13 AM  LOS: 2 days   SUBJECTIVE:   Ready to go home.  [place]ells well.    OBJECTIVE:         Vital signs in last 24 hours:    Temp:  [97.7 F (36.5 C)-98.3 F (36.8 C)] 98.3 F (36.8 C) (02/07 0951) Pulse Rate:  [67-79] 79 (02/07 0951) Resp:  [17-20] 18 (02/07 0951) BP: (105-154)/(45-75) 144/75 (02/07 0951) SpO2:  [93 %-99 %] 97 % (02/07 0951) Weight:  [98.9 kg (218 lb)-100 kg (220 lb 7.4 oz)] 100 kg (220 lb 7.4 oz) (02/06 2142) Last BM Date: 04/17/17 Filed Weights   04/16/17 2035 04/17/17 1035 04/17/17 2142  Weight: 98.9 kg (218 lb) 98.9 kg (218 lb) 100 kg (220 lb 7.4 oz)   General: looks well.   Heart: RRR Chest: clear bil Abdomen: soft, NT, active BS  Neuro/Psych:  Oriented x 3.  Fully alert.  Appropriate.   Intake/Output from previous day: 02/06 0701 - 02/07 0700 In: 1797 [P.O.:1380; I.V.:300] Out: 0   Intake/Output this shift: Total I/O In: 240 [P.O.:240] Out: 0   Lab Results: Recent Labs    04/16/17 1636 04/17/17 0059 04/18/17 0733  WBC 9.4 8.9 PENDING  HGB 9.0* 9.0* 8.6*  HCT 31.6* 30.9* 30.2*  PLT 232 248 PENDING   BMET Recent Labs    04/15/17 1636 04/15/17 1920  NA 139 139  K 4.1 3.8  CL 104 105  CO2 29 23  GLUCOSE 140* 119*  BUN 18 15  CREATININE 0.89 0.85  CALCIUM 8.7 8.8*   LFT Recent Labs    04/15/17 1636  PROT 6.7  ALBUMIN 3.8  AST 12  ALT 9  ALKPHOS 97  BILITOT 0.5  BILIDIR 0.1   PT/INR Recent Labs    04/16/17 0605  LABPROT 16.5*  INR 1.35   Hepatitis Panel No results for input(s): HEPBSAG, HCVAB, HEPAIGM, HEPBIGM in the last 72 hours.  Studies/Results: No results found.   Scheduled Meds: . amLODipine  5 mg Oral Daily  . cholecalciferol  5,000 Units Oral Daily  . dofetilide  250 mcg Oral BID  . irbesartan  150 mg Oral Daily  . magnesium oxide  400 mg Oral Daily  . metoprolol tartrate  25 mg Oral BID  . pantoprazole   40 mg Oral Daily  . PARoxetine  40 mg Oral QHS  . polycarbophil  1,250 mg Oral QHS  . rivaroxaban  15 mg Oral Q supper  . rosuvastatin  5 mg Oral q morning - 10a  . tamsulosin  0.4 mg Oral Daily   Continuous Infusions: PRN Meds:.acetaminophen **OR** acetaminophen, LORazepam, Melatonin, ondansetron **OR** ondansetron (ZOFRAN) IV, traZODone   ASSESMENT:   *  FOBT negative anemia.  Microcytic anemia.  S/p PRBC x 2.  S/p Feraheme infusion.   Colonoscopy 2/6:  Non-bleeding AVMs x 3, laser ablated.  Distal descending and sigmoid tics, not bleeding.  tatoo seen in distal colon.  Internal rrhoids.  .   EGD 2/6: HH and non-bleeding AVMs x 2, laser ablated.       AVMs likely source of anemia and slow, intermittent GI bleeding.    Though Hgb declined a bit today, he is ok to discharge so long as he gets a CBC within next 7 days.    *  Chronic xarelto.  Resumed.    PLAN   *  GI will sign off.  Iron sulfate 325  mg BID.   Dr Earlean Shawl can assume outpt GI care as needed, no need to schedule follow up.   PMD needs to monitor CBC/Hgb closely.    *  Continue oral iron.   I added 325 mg bid as rec by Dr Havery Moros.    *  Diet at discharge as before admission.     George Vega  04/18/2017, 10:13 AM Pager: [phone]

## 2017-04-22 LAB — HEMOCHROMATOSIS DNA-PCR(C282Y,H63D)

## 2017-04-23 ENCOUNTER — Telehealth: Payer: Self-pay | Admitting: Internal Medicine

## 2017-04-23 NOTE — Telephone Encounter (Signed)
New message   Patient spouse calling with concerns of swelling that has continued since discharge from hospital. Please call    Pt c/o swelling: STAT is pt has developed SOB within 24 hours  1) How much weight have you gained and in what time span? N/A  2) If swelling, where is the swelling located? FEET, ANKLES for 1 week  3) Are you currently taking a fluid pill? NO  4) Are you currently SOB? YES  5) Do you have a log of your daily weights (if so, list)? N/A  6) Have you gained 3 pounds in a day or 5 pounds in a week? N/A  7) Have you traveled recently? NO

## 2017-04-23 NOTE — Telephone Encounter (Signed)
Pt called by scheduling, Pt will see Dr. Lovena Le tomorrow 04/23/2017 at 11:15 am.  No further action needed at this time.

## 2017-04-24 ENCOUNTER — Encounter (INDEPENDENT_AMBULATORY_CARE_PROVIDER_SITE_OTHER): Payer: Medicare Other | Admitting: Ophthalmology

## 2017-04-24 ENCOUNTER — Ambulatory Visit (INDEPENDENT_AMBULATORY_CARE_PROVIDER_SITE_OTHER): Payer: Medicare Other | Admitting: Internal Medicine

## 2017-04-24 ENCOUNTER — Encounter: Payer: Self-pay | Admitting: Internal Medicine

## 2017-04-24 VITALS — BP 128/74 | HR 73 | Ht 69.0 in | Wt 219.4 lb

## 2017-04-24 DIAGNOSIS — H35033 Hypertensive retinopathy, bilateral: Secondary | ICD-10-CM | POA: Diagnosis not present

## 2017-04-24 DIAGNOSIS — I481 Persistent atrial fibrillation: Secondary | ICD-10-CM | POA: Diagnosis not present

## 2017-04-24 DIAGNOSIS — H353122 Nonexudative age-related macular degeneration, left eye, intermediate dry stage: Secondary | ICD-10-CM

## 2017-04-24 DIAGNOSIS — I4819 Other persistent atrial fibrillation: Secondary | ICD-10-CM

## 2017-04-24 DIAGNOSIS — R609 Edema, unspecified: Secondary | ICD-10-CM | POA: Diagnosis not present

## 2017-04-24 DIAGNOSIS — I10 Essential (primary) hypertension: Secondary | ICD-10-CM

## 2017-04-24 DIAGNOSIS — R0602 Shortness of breath: Secondary | ICD-10-CM | POA: Diagnosis not present

## 2017-04-24 DIAGNOSIS — H318 Other specified disorders of choroid: Secondary | ICD-10-CM | POA: Diagnosis not present

## 2017-04-24 DIAGNOSIS — H43813 Vitreous degeneration, bilateral: Secondary | ICD-10-CM | POA: Diagnosis not present

## 2017-04-24 MED ORDER — POTASSIUM CHLORIDE ER 10 MEQ PO TBCR: 10.0000 meq | EXTENDED_RELEASE_TABLET | Freq: Every day | ORAL | 3 refills | Status: DC

## 2017-04-24 MED ORDER — FUROSEMIDE 40 MG PO TABS: 40.0000 mg | ORAL_TABLET | Freq: Every day | ORAL | 3 refills | Status: DC

## 2017-04-24 NOTE — Progress Notes (Signed)
HPI Mr. Budai returns today for followup. He is a pleasant 77 man with a h/o HTN, PAF, acute on chronic diastolic heart failure, and GI bleeding. 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 He feels better. He has had sob and left leg swelling. No injury and he is on [place]. He admits to dietary indiscretion with sodium.  Allergies  Allergen Reactions  . Glucosamine-Chondroitin Other (See Comments) and Anaphylaxis    Stomach cramps, can't eat Stomach cramps, can't eat  . Tetanus Toxoid Anaphylaxis, Hives and Other (See Comments)    REACTION: hives/throat swells shut REACTION: hives/throat swells shut  . Fish Oil Other (See Comments)    Stomach cramps, can't eat  Stomach cramps, can't eat  . Ibuprofen Nausea Only and Other (See Comments)    Stomach pains Stomach pains  . Naproxen Diarrhea     Current Outpatient Medications  Medication Sig Dispense Refill  . acetaminophen (TYLENOL) 325 MG tablet Take 650 mg by mouth every 6 (six) hours as needed for moderate pain.    Marland Kitchen amLODipine (NORVASC) 5 MG tablet TAKE 1 TABLET BY MOUTH  DAILY 90 tablet 3  . Cholecalciferol (VITAMIN D3) 5000 units TABS Take 5,000 Units by mouth daily.    . Cyanocobalamin (VITAMIN B-12) 500 MCG SUBL Place 500 mcg under the tongue 3 (three) times a week.     . dofetilide (TIKOSYN) 250 MCG capsule TAKE 1 CAPSULE BY MOUTH TWO TIMES DAILY 180 capsule 3  . ferrous sulfate 325 (65 FE) MG EC tablet Take 1 tablet (325 mg total) by mouth 2 (two) times daily. 60 tablet 0  . furosemide (LASIX) 40 MG tablet Take 1 tablet (40 mg total) by mouth as directed. Take 1 tablet daily for 3 days then take only as needed (Patient taking differently: Take 40 mg by mouth daily as needed for fluid. ) 30 tablet 6  . irbesartan (AVAPRO) 150 MG tablet TAKE 1 TABLET(150 MG) BY MOUTH DAILY 90 tablet 3  . LORazepam (ATIVAN) 0.5 MG tablet Take 1/2 to 1 tablet daily as needed for acute anxiety. Limit use. Do no mix  w/ trazodone.    . magnesium oxide (MAG-OX) 400 MG tablet Take 400 mg by mouth daily.    . Melatonin 10 MG TABS Take 10 mg by mouth at bedtime as needed (sleep).     . metoprolol tartrate (LOPRESSOR) 25 MG tablet TAKE 1 TABLET BY MOUTH TWO  TIMES DAILY 180 tablet 3  . Multiple Vitamins-Minerals (PRESERVISION AREDS 2 PO) Take 1 capsule by mouth 2 (two) times daily.    . pantoprazole (PROTONIX) 40 MG tablet TAKE 1 TABLET(40 MG) BY MOUTH DAILY    . PARoxetine (PAXIL) 30 MG tablet Take 40 mg by mouth at bedtime.     . polycarbophil (FIBERCON) 625 MG tablet Take 1,250 mg by mouth at bedtime.     [name] Glycol-Propyl Glycol (SYSTANE OP) Place 2 drops into both eyes 2 (two) times daily as needed (dry eyes).     . potassium chloride (K-DUR) 10 MEQ tablet Take 1 tablet (10 mEq total) by mouth as directed. Take 1 tablet daily for 3 days; then only take when you take the lasix as needed (Patient taking differently: Take 10 mEq by mouth daily as needed (when taking lasix). ) 30 tablet 6  . Rivaroxaban (XARELTO) 15 MG TABS tablet Take 1 tablet (15 mg total) by mouth daily with supper. 30 tablet 0  .  rosuvastatin (CRESTOR) 5 MG tablet Take 1 tablet (5 mg total) by mouth every morning. 90 tablet 3  . tamsulosin (FLOMAX) 0.4 MG CAPS capsule Take 0.4 mg by mouth daily.  11  . traZODone (DESYREL) 50 MG tablet TAKE 1/2 TO 1 TABLET BY MOUTH AS NEEDED FOR SLEEP     No current facility-administered medications for this visit.      Past Medical History:  Diagnosis Date  . Arthritis   . Atrial fibrillation, persistent ([place])   . Chronic sinusitis   . Coronary artery disease   . Depression   . Difficult intubation    was told with shoulder done 2006-alittle narrow  . 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 Hypertension   . Jejunostomy tube fell out    when asked about this in 04/2017, pt denied ever having a J tube, feeding tube, tubes placed post surgery so ??? veracity of a previous J tube.    . 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 Spastic colon   . Stroke (cerebrum) ([place])   . SVT (supraventricular tachycardia) ([facility])     ROS:   All systems reviewed and negative except as noted in the HPI.   Past Surgical History:  Procedure Laterality Date  . COLONOSCOPY WITH PROPOFOL N/A 04/17/2017   Procedure: COLONOSCOPY WITH PROPOFOL;  Surgeon: Yetta Flock, MD;  Location: [facility];  Service: Gastroenterology;  Laterality: N/A;  . ESOPHAGEAL DILATION  2017  . ESOPHAGOGASTRODUODENOSCOPY (EGD) WITH PROPOFOL N/A 04/17/2017   Procedure: ESOPHAGOGASTRODUODENOSCOPY (EGD) WITH PROPOFOL;  Surgeon: Yetta Flock, MD;  Location: [facility];  Service: Gastroenterology;  Laterality: N/A;  . FOOT NEUROMA SURGERY  2002  . LEFT HEART CATHETERIZATION WITH CORONARY ANGIOGRAM Right 06/14/2011   20% LM, chronic occluded mid LAD, 50% ostial LCX, 20% mid RI, RCA with collaterals to mid LAD, mid 10% stenosis, EF 60% 06/14/11  . NASAL SINUS SURGERY    . SHOULDER ARTHROSCOPY  03/01/2011   Procedure: ARTHROSCOPY SHOULDER;  Surgeon: Ninetta Lights, MD;  Location: [facility];  Service: Orthopedics;  Laterality: Right;  arthroscopy shoulder decompression subacromial partial acromioplasty with coracoacromial release, distal claviculectomy, debridement of labrium  . SHOULDER ARTHROSCOPY  03/01/2011   Procedure: ARTHROSCOPY SHOULDER;  Surgeon: Ninetta Lights, MD;  Location: [facility];  Service: Orthopedics;  Laterality: Right;  arthroscopy shoulder decompression subacromial partial acromioplasty with coracoacromial release, distal claviculectomy, debridement of labrium  . TEE WITHOUT CARDIOVERSION N/A 03/19/2016   Procedure: TRANSESOPHAGEAL ECHOCARDIOGRAM (TEE);  Surgeon: Larey Dresser, MD;  Location: [facility];  Service: Cardiovascular;  Laterality: N/A;  . TOTAL KNEE ARTHROPLASTY Right 09/23/2013   Procedure: TOTAL KNEE ARTHROPLASTY;  Surgeon: Ninetta Lights, MD;  Location: [facility];  Service: Orthopedics;  Laterality: Right;  . TOTAL SHOULDER ARTHROPLASTY  06/28/2011   Procedure: TOTAL SHOULDER ARTHROPLASTY;  Surgeon: Ninetta Lights, MD;  Location: [facility];  Service: Orthopedics;  Laterality: Right;  . UVULOPALATOPHARYNGOPLASTY       Family History  Problem Relation Age of Onset  . Cancer Mother        colon  . Heart attack Father   . Migraines Brother      Social History   Socioeconomic History  . Marital status: Married    Spouse name: Not on file  . Number of children: 3  . Years of education: Not on file  . Highest education level: Not on file  Social Needs  .  Financial resource strain: Not on file  . Food insecurity - worry: Not on file  . Food insecurity - inability: Not on file  . Transportation needs - medical: Not on file  . Transportation needs - non-medical: Not on file  Occupational History  . Occupation: retired  Tobacco Use  . Smoking status: Former Smoker    Packs/day: 2.00    Years: 30.00    Pack years: 60.00    Types: Cigarettes    Last attempt to quit: 02/27/1998    Years since quitting: 19.1  . Smokeless tobacco: Never Used  Substance and Sexual Activity  . Alcohol use: Yes    Alcohol/week: 8.4 oz    Types: 14 Standard drinks or equivalent per week    Comment: daily  . Drug use: No  . Sexual activity: Not on file  Other Topics Concern  . Not on file  Social History Narrative   Patient is left handed.   Patient drinks one cup caffeine daily.     BP 128/74   Pulse 73   Ht 5\' 9"  (1.753 m)   Wt 219 lb 6.4 oz (99.5 kg)   SpO2 97%   BMI 32.40 kg/m   Physical Exam:  Well appearing 77 yo man, NAD HEENT: Unremarkable Neck:  6 cm JVD, no thyromegally Lymphatics:  No adenopathy Back:  No CVA tenderness Lungs:  Clear with no wheezes HEART:  Regular rate rhythm, no murmurs, no rubs, no clicks Abd:  soft, positive bowel sounds, no organomegally, no rebound, no  guarding Ext:  2 plus pulses, leg with 2+ edema, none on right leg, no cyanosis, no clubbing Skin:  No rashes no nodules Neuro:  CN II through XII intact, motor grossly intact  EKG - NSR with PAC's  Assess/Plan: 1. PAF - he is maintaining NSR on dofetilide. Continue. 2. Chronic diastolic heart failure - he has class 2-3 symptoms. I have asked him to take lasix 40 daily, and reduce the salt intake. 3. Anemia - he is no longer bleeding. If he rebleeds, might have to stop his [place] 4. HTN - he is on amlodipine which could be contributing to his left leg swelling. Will continue for now and I have asked him to obtain a support stocking.  Mikle Bosworth.D

## 2017-04-24 NOTE — Patient Instructions (Addendum)
Medication Instructions:  Your physician has recommended you make the following change in your medication:  1.  Start furosemide (lasix) 40 mg one tablet by mouth daily. 2.  Start potassium 10 meq one tablet by mouth daily.  Labwork: You will need a BMP/CBC in 2 weeks.  Please schedule this.  Testing/Procedures: None ordered.  Follow-Up: Your physician wants you to follow-up in: 3 months with Dr. Lovena Le.     Any Other Special Instructions Will Be Listed Below (If Applicable).  If you need a refill on your cardiac medications before your next appointment, please call your pharmacy.

## 2017-04-25 ENCOUNTER — Other Ambulatory Visit: Payer: Self-pay | Admitting: Otolaryngology

## 2017-04-25 DIAGNOSIS — H903 Sensorineural hearing loss, bilateral: Secondary | ICD-10-CM

## 2017-04-25 DIAGNOSIS — H905 Unspecified sensorineural hearing loss: Secondary | ICD-10-CM

## 2017-05-05 ENCOUNTER — Other Ambulatory Visit: Payer: Self-pay | Admitting: Internal Medicine

## 2017-05-06 ENCOUNTER — Ambulatory Visit: Payer: Medicare Other | Admitting: Physician Assistant

## 2017-05-06 ENCOUNTER — Telehealth: Payer: Self-pay | Admitting: Internal Medicine

## 2017-05-06 NOTE — Telephone Encounter (Signed)
Advised MW of CT that was scheduled for tomorrow. He advised that patient does not need it tomorrow he was needing a 12 month repeat. Advised [facility] of this and they are fixing it. Nothing further needed.

## 2017-05-07 ENCOUNTER — Ambulatory Visit
Admission: RE | Admit: 2017-05-07 | Discharge: 2017-05-07 | Disposition: A | Discharge status: Home / Self Care | Payer: Medicare Other | Source: Ambulatory Visit | Attending: Otolaryngology | Admitting: Otolaryngology

## 2017-05-07 ENCOUNTER — Ambulatory Visit
Admission: RE | Admit: 2017-05-07 | Discharge: 2017-05-07 | Disposition: A | Discharge status: Home / Self Care | Payer: Medicare Other | Source: Ambulatory Visit | Attending: Internal Medicine | Admitting: Internal Medicine

## 2017-05-07 DIAGNOSIS — H903 Sensorineural hearing loss, bilateral: Secondary | ICD-10-CM

## 2017-05-07 DIAGNOSIS — R911 Solitary pulmonary nodule: Secondary | ICD-10-CM

## 2017-05-07 DIAGNOSIS — H905 Unspecified sensorineural hearing loss: Secondary | ICD-10-CM

## 2017-05-07 MED ORDER — GADOBENATE DIMEGLUMINE 529 MG/ML IV SOLN
20.0000 mL | Freq: Once | INTRAVENOUS | Status: AC | PRN
  Administered 2017-05-07: 20 mL via INTRAVENOUS

## 2017-05-08 ENCOUNTER — Other Ambulatory Visit: Payer: Medicare Other | Admitting: *Deleted

## 2017-05-08 DIAGNOSIS — I4819 Other persistent atrial fibrillation: Secondary | ICD-10-CM

## 2017-05-08 DIAGNOSIS — R609 Edema, unspecified: Secondary | ICD-10-CM

## 2017-05-08 DIAGNOSIS — R0602 Shortness of breath: Secondary | ICD-10-CM

## 2017-05-09 LAB — CBC WITH DIFFERENTIAL/PLATELET
BASOS ABS: 0.1 10*3/uL (ref 0.0–0.2)
BASOS: 1 %
EOS (ABSOLUTE): 0.6 10*3/uL — AB (ref 0.0–0.4)
Eos: 6 %
HEMOGLOBIN: 12.9 g/dL — AB (ref 13.0–17.7)
Hematocrit: 41.8 % (ref 37.5–51.0)
Immature Grans (Abs): 0 10*3/uL (ref 0.0–0.1)
Immature Granulocytes: 0 %
LYMPHS ABS: 2.1 10*3/uL (ref 0.7–3.1)
LYMPHS: 23 %
MCH: 25.6 pg — AB (ref 26.6–33.0)
MCHC: 30.9 g/dL — ABNORMAL LOW (ref 31.5–35.7)
MCV: 83 fL (ref 79–97)
MONOS ABS: 0.8 10*3/uL (ref 0.1–0.9)
Monocytes: 9 %
NEUTROS ABS: 5.5 10*3/uL (ref 1.4–7.0)
Neutrophils: 61 %
Platelets: 304 10*3/uL (ref 150–379)
RBC: 5.03 x10E6/uL (ref 4.14–5.80)
WBC: 9.1 10*3/uL (ref 3.4–10.8)

## 2017-05-09 LAB — BASIC METABOLIC PANEL
BUN / CREAT RATIO: 17 (ref 10–24)
BUN: 17 mg/dL (ref 8–27)
CALCIUM: 9.5 mg/dL (ref 8.6–10.2)
CHLORIDE: 99 mmol/L (ref 96–106)
CO2: 27 mmol/L (ref 20–29)
CREATININE: 1 mg/dL (ref 0.76–1.27)
GFR calc non Af Amer: 73 mL/min/{1.73_m2} (ref >59)
GFR, EST AFRICAN AMERICAN: 84 mL/min/{1.73_m2} (ref >59)
Glucose: 108 mg/dL — ABNORMAL HIGH (ref 65–99)
Potassium: 4.1 mmol/L (ref 3.5–5.2)
Sodium: 140 mmol/L (ref 134–144)

## 2017-05-15 ENCOUNTER — Ambulatory Visit (INDEPENDENT_AMBULATORY_CARE_PROVIDER_SITE_OTHER): Payer: Medicare Other | Admitting: Internal Medicine

## 2017-05-15 ENCOUNTER — Encounter: Payer: Self-pay | Admitting: Internal Medicine

## 2017-05-15 VITALS — BP 104/60 | HR 78 | Ht 69.0 in | Wt 212.0 lb

## 2017-05-15 DIAGNOSIS — R0609 Other forms of dyspnea: Secondary | ICD-10-CM

## 2017-05-15 DIAGNOSIS — R05 Cough: Secondary | ICD-10-CM

## 2017-05-15 DIAGNOSIS — R058 Other specified cough: Secondary | ICD-10-CM

## 2017-05-15 DIAGNOSIS — R918 Other nonspecific abnormal finding of lung field: Secondary | ICD-10-CM | POA: Diagnosis not present

## 2017-05-15 LAB — PULMONARY FUNCTION TEST
DL/VA % PRED: 94 %
DL/VA: 4.27 ml/min/mmHg/L
DLCO COR % PRED: 63 %
DLCO COR: 19.55 ml/min/mmHg
DLCO unc % pred: 59 %
DLCO unc: 18.54 ml/min/mmHg
FEF 25-75 POST: 2.33 L/s
FEF 25-75 Pre: 1.7 L/sec
FEF2575-%Change-Post: 37 %
FEF2575-%Pred-Post: 112 %
FEF2575-%Pred-Pre: 82 %
FEV1-%CHANGE-POST: 11 %
FEV1-%Pred-Post: 79 %
FEV1-%Pred-Pre: 71 %
FEV1-POST: 2.31 L
FEV1-Pre: 2.07 L
FEV1FVC-%Change-Post: 1 %
FEV1FVC-%PRED-PRE: 105 %
FEV6-%Change-Post: 11 %
FEV6-%PRED-PRE: 71 %
FEV6-%Pred-Post: 79 %
FEV6-POST: 2.98 L
FEV6-Pre: 2.67 L
FEV6FVC-%Change-Post: 0 %
FEV6FVC-%PRED-POST: 106 %
FEV6FVC-%Pred-Pre: 106 %
FVC-%CHANGE-POST: 9 %
FVC-%PRED-PRE: 67 %
FVC-%Pred-Post: 74 %
FVC-PRE: 2.71 L
FVC-Post: 2.98 L
Post FEV1/FVC ratio: 77 %
Post FEV6/FVC ratio: 100 %
Pre FEV1/FVC ratio: 76 %
Pre FEV6/FVC Ratio: 100 %
RV % pred: 114 %
RV: 2.91 L
TLC % PRED: 92 %
TLC: 6.32 L

## 2017-05-15 NOTE — Progress Notes (Signed)
Subjective:    Patient ID: George Vega, male    DOB: 1940/05/13,    MRN: [id number]  Brief patient profile:   36 yowm quit smoking 1999 slowed down by arthritis but new waking from sleeping with sob x around 11/2015 referred to pulmonary clinic 01/20/2016 by Dr  Lovena Le with abn pfts 12/16/15      History of Present Illness  01/20/2016 1st [facility] office visit/ W[facility]   Chief Complaint  Patient presents with  . Pulmonary Consult    Referred by Dr. Lovena Le. Pt c/o SOB off and on for the past 2 months. He states it mainly bothers him when he lies down. He occ gets SOB after he exerts himself.    wakes up sob x 2 months once a week x 20 sec, couple dep breaths and feels better.  Also wife hears noisy breathing at rest during same time period/ pt has sense of pnds and freq clearing his throat daytime only  He's more concerned with fatigue limiting activity tol than sob  Note has h/o acei cough remotely, resolved on arb rec Omeprazole 40   Take  30-60 min before first meal of the day and Pepcid (famotidine)  20 mg one @  bedtime until return to office -did not do GERD  Diet ? followed Please remember to go to the lab Allergy profile 01/20/2016 >  Eos 0.6 /  IgE  319 with RAST Mold > dog ? Low grade ABPA ? > rec f/u to discuss options but did not return and not interested in allergy eval or rec to keep dogs out of bedroom   10/09/2016  New problem = ov/Wert re: MPNS Chief Complaint  Patient presents with  . Pulmonary Consult    Referred by Fulton Mole, PA for eval of abnormal low dose CT Chest- done 10/03/16. Pt denies any respiratory co's today.   pt did not actually qualify for low dose screening Ct as > 15 years since quit but the low dose scan showed multiple bilateral nodules so referred to pulmonary clinic 10/09/2016 by Roe Coombs Not limited by breathing from desired activities   Still having nasal congestion and urge to clear throat and never took gerd trial  "it's just not that bad"  Rec We will contact you in 6 months for follow up CT chest - call sooner if new chest pain or unexplained cough      04/15/2017  f/u ov/Wert re:  MPNs / worse doe x one month Chief Complaint  Patient presents with  . Follow-up    Pt states he is having worsening SOB mainly with exertion. Denies any cough or CP.  Dyspnea:  MMRC3 = can't walk 100 yards even at a slow pace at a flat grade s stopping due to sob   Cough: no Sleep: ok L side down / 6 in under hob > once settles down does ok  Has had increased swelling L leg and pallor over last month also / no obvious bleeding on xarelto  rec Pantoprazole should be Take 30-60 min before first meal of the day  Please remember to go to the lab department downstairs in the basement  for your tests - we will call you with the results when they are available. hgb 6.9 > admit  Admit date: 04/15/2017 Discharge date: 04/18/2017  Time spent: 45 minutes  Recommendations for Outpatient Follow-up:  Patient will be discharged to home.  Patient will need to follow up with  primary care provider within one week of discharge.  Follow-up with Dr. Beryle Beams, hematologist.  Follow-up with Dr. Havery Moros, gastroenterologist.  Patient should continue medications as prescribed.  Patient should follow a heart healthy diet.   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 Follow-up    PFT's today. Breathing has improved back to his normal baseline. No new co's.   Dyspnea:  Steps ok now, really Not limited by breathing from desired activities   Cough: no Sleep: ok 5in blocks  SABA use:       No obvious day to day or daytime variability or assoc  excess/ purulent sputum or mucus plugs or hemoptysis or cp or chest tightness, subjective wheeze or overt sinus or hb symptoms. No unusual exposure hx or h/o childhood pna/ asthma or knowledge of premature birth.  Sleeping ok flat without nocturnal  or early am exacerbation  of respiratory  c/o's or need for noct saba. Also denies any obvious fluctuation of symptoms with weather or environmental changes or other aggravating or alleviating factors except as outlined above   Current Allergies, Complete Past Medical History, Past Surgical History, Family History, and Social History were reviewed in [facility] record.  ROS  The following are not active complaints unless bolded Hoarseness, sore throat, dysphagia, dental problems, itching, sneezing,  nasal congestion or discharge of excess mucus or purulent secretions, ear ache,   fever, chills, sweats, unintended wt loss or wt gain, classically pleuritic or exertional cp,  orthopnea pnd or leg swelling, presyncope, palpitations, abdominal pain, anorexia, nausea, vomiting, diarrhea  or change in bowel habits or change in bladder habits, change in stools or change in urine, dysuria, hematuria,  rash, arthralgias, visual complaints, headache, numbness, weakness or ataxia or problems with walking or coordination,  change in mood/affect or memory.        Current Meds  Medication Sig  . acetaminophen (TYLENOL) 325 MG tablet Take 650 mg by mouth every 6 (six) hours as needed for moderate pain.  Marland Kitchen amLODipine (NORVASC) 5 MG tablet TAKE 1 TABLET BY MOUTH  DAILY  . Cholecalciferol (VITAMIN D3) 5000 units TABS Take 5,000 Units by mouth daily.  . Cyanocobalamin (VITAMIN B-12) 500 MCG SUBL Place 500 mcg under the tongue 3 (three) times a week.   . dofetilide (TIKOSYN) 250 MCG capsule TAKE 1 CAPSULE BY MOUTH TWO TIMES DAILY  . ferrous sulfate 325 (65 FE) MG EC tablet Take 1 tablet (325 mg total) by mouth 2 (two) times daily.  . furosemide  (LASIX) 40 MG tablet Take 1 tablet (40 mg total) by mouth daily.  . irbesartan (AVAPRO) 150 MG tablet TAKE 1 TABLET(150 MG) BY MOUTH DAILY  . LORazepam (ATIVAN) 0.5 MG tablet Take 1/2 to 1 tablet daily as needed for acute anxiety. Limit use. Do no mix w/ trazodone.  . magnesium oxide (MAG-OX) 400 MG tablet Take 400 mg by mouth daily.  . Melatonin 10 MG TABS Take 10 mg by mouth at bedtime as needed (sleep).   . metoprolol tartrate (LOPRESSOR) 25 MG tablet TAKE 1 TABLET BY MOUTH TWO  TIMES DAILY  . Multiple Vitamins-Minerals (PRESERVISION AREDS 2 PO) Take 1 capsule by mouth 2 (two) times daily.  . pantoprazole (PROTONIX) 40 MG tablet  TAKE 1 TABLET(40 MG) BY MOUTH DAILY  . PARoxetine (PAXIL) 30 MG tablet Take 40 mg by mouth at bedtime.   . polycarbophil (FIBERCON) 625 MG tablet Take 1,250 mg by mouth at bedtime.   [name] Glycol-Propyl Glycol (SYSTANE OP) Place 2 drops into both eyes 2 (two) times daily as needed (dry eyes).   . potassium chloride (K-DUR) 10 MEQ tablet Take 1 tablet (10 mEq total) by mouth daily.  . Rivaroxaban (XARELTO) 15 MG TABS tablet Take 1 tablet (15 mg total) by mouth daily with supper.  . rosuvastatin (CRESTOR) 5 MG tablet Take 1 tablet (5 mg total) by mouth every morning.  . tamsulosin (FLOMAX) 0.4 MG CAPS capsule Take 0.4 mg by mouth daily.  . traZODone (DESYREL) 50 MG tablet TAKE 1/2 TO 1 TABLET BY MOUTH AS NEEDED FOR SLEEP              Objective:   Physical Exam  Pleasant amb wm, all smiles / still nasal tone to voice "all my life"  05/15/2017        212   04/15/2017        222  10/09/2016      222   01/20/16 218 lb 9.6 oz (99.2 kg)  12/14/15 212 lb (96.2 kg)  11/17/15 211 lb 4.8 oz (95.8 kg)     Vital signs reviewed - Note on arrival 02 sats  96% on RA        HEENT: nl dentition, turbinates bilaterally, and oropharynx. Nl external ear canals without cough reflex   NECK :  without JVD/Nodes/TM/ nl carotid upstrokes bilaterally   LUNGS: no acc  muscle use,  Nl contour chest which is clear to A and P bilaterally without cough on insp or exp maneuvers   CV:  IRIR  no s3 or murmur or increase in P2, and no edema   ABD:  soft and nontender with nl inspiratory excursion in the supine position. No bruits or organomegaly appreciated, bowel sounds nl  MS:  Nl gait/ ext warm without deformities, calf tenderness, cyanosis or clubbing No obvious joint restrictions   SKIN: warm and dry without lesions    NEURO:  alert, approp, nl sensorium with  no motor or cerebellar deficits apparent.          I personally reviewed images and agree with radiology impression as follows:   Chest CT w/o contrast 04/09/17 1. Previously noted nodules are stable compared to the prior study. These are strongly favored to be benign. The nodular area of architectural distortion in the posterior aspect of the right lower lobe is most likely an area of post infectious or inflammatory scarring. Repeat noncontrast chest CT could be considered in 12 months to ensure continued stability. 2. Mild diffuse bronchial wall thickening with mild to moderate centrilobular and paraseptal emphysema. 3. Aortic atherosclerosis, in addition to left main and 3 vessel coronary artery disease. Assessment for potential risk factor modification, dietary therapy or pharmacologic therapy may be warranted, if clinically indicated. 4. There are calcifications of the aortic valve and mitral annulus. Echocardiographic correlation for evaluation of potential valvular dysfunction may be warranted if clinically indicated.            Assessment & Plan:

## 2017-05-15 NOTE — Patient Instructions (Signed)
Pulmonary follow up will be determined after I review all your studies to see if a follow up CT is needed

## 2017-05-15 NOTE — Progress Notes (Signed)
Patient completed full PFT today. 

## 2017-05-16 ENCOUNTER — Telehealth: Payer: Self-pay

## 2017-05-16 ENCOUNTER — Telehealth: Payer: Self-pay | Admitting: *Deleted

## 2017-05-16 ENCOUNTER — Encounter: Payer: Self-pay | Admitting: Internal Medicine

## 2017-05-16 NOTE — Telephone Encounter (Signed)
-----   Message from Tanda Rockers, MD sent at 05/16/2017  5:27 AM EST ----- Let him know I reviewed all his scans and all he needs is a f/u Ct 04/09/2018  I placed in reminder file

## 2017-05-16 NOTE — Assessment & Plan Note (Addendum)
PFTs  12/16/15  FVC  3.32 (81%) no obst and DLCO 59/58c and 77% with correction for alv vol - Spirometry 01/20/2016  FVC  3.12 (74%) and exp truncation  - 01/20/2016  Walked RA x 3 laps @ 185 ft each stopped due to  End of study, nl pace, mild sob with cough at end  - HRCT CT 04/09/2017 1. Previously noted nodules are stable compared to the prior study. These are strongly favored to be benign. The nodular area of architectural distortion in the posterior aspect of the right lower lobe is most likely an area of post infectious or inflammatory scarring. Repeat noncontrast chest CT could be considered in 12 months to ensure continued stability. 2. Mild diffuse bronchial wall thickening with mild to moderate centrilobular and paraseptal emphysema. 3. Aortic atherosclerosis, in addition to left main and 3 vessel coronary artery disease - 04/15/2017   Walked RA x one lap @ 185 stopped due sob with ok sats but Hgb 6.9 > sent to ER as on xarelto with evidence of possible fluid overload on exam  - PFT's  05/15/2017  FEV1 2.31 (79 % ) ratio 77  p 11 % improvement from saba p 0 prior to study with DLCO  59/63 % corrects to 94  % for alv volume     He is back to baseline ex tol p correction of anemia  with no def evidence of lung dz here though could have very mild asthma ? Driven by chronic sinus dz  rec sinus ct and ent eval prn  Pulmonary f/u is prn

## 2017-05-16 NOTE — Telephone Encounter (Signed)
Spoke with the pt's spouse and notified of recs per MW and she verbalized understanding and will inform the pt

## 2017-05-16 NOTE — Assessment & Plan Note (Signed)
Allergy profile 01/20/2016 >  Eos 0.6 /  IgE  319 with RAST Mold > dog ? Low grade ABPA ? MR Brain 10/217 Mild moderate mucosal thickening right maxillary sinus with air-fluid level suggesting acute sinusitis.   - repeat sinus CT ordered > ent f/u prn

## 2017-05-16 NOTE — Telephone Encounter (Signed)
-----   Message from Evans Lance, MD sent at 05/15/2017  9:45 PM EST ----- His labs are good. Continue current meds.

## 2017-05-16 NOTE — Telephone Encounter (Signed)
Pt is aware and agreeable to normal results. Pt is agreeable to continuing current medication regiment

## 2017-05-16 NOTE — Assessment & Plan Note (Addendum)
Chest LDCT   10/03/16 Scattered calcified granulomas. A pleural-based right lower lobe density dependently measures volume derived equivalent diameter 13.4 mm, including on image 157/series 3. Other noncalcified pulmonary nodules, the largest of which is positioned within the lingula and measures volume derived equivalent diameter 5.5 mm on image 183/series 3. - HRCT 04/09/2017 1. Previously noted nodules are stable compared to the prior study. These are strongly favored to be benign. The nodular area of architectural distortion in the posterior aspect of the right lower lobe is most likely an area of post infectious or inflammatory scarring. Repeat noncontrast chest CT  rec at 12 m - CT chest w/o contrast ? Why done : 1. Increase in soft tissue thickening about the confluence of the right major and minor fissures. Nonspecific. Consider chest CT follow-up at 3-6 months. 2. Otherwise, similar appearance of the chest compared to 04/09/2017.   - 05/15/2017 rec f/u CT 04/09/18    Comment He is out >15 years from smoking so is low risk but not no risk and now needs yet another ct for incidental and aysymptomatic thus "microscopic" findings  CT results reviewed with pt >>> Too small for PET or bx, not suspicious enough for excisional bx > really only option for now is follow the Fleischner society guidelines as rec by radiology.   Discussed in detail all the  indications, usual  risks and alternatives  relative to the benefits with patient who agrees to proceed with conservative f/u with final ct at 04/09/18 to reduce radiation exp and redundancy of studies he's unfortunately already been exposed to.   I had an extended discussion with the patient and wife  reviewing all relevant studies completed to date and  lasting 15 to 20 minutes of a 25 minute visit    Each maintenance medication was reviewed in detail including most importantly the difference between maintenance and prns and under what  circumstances the prns are to be triggered using an action plan format that is not reflected in the computer generated alphabetically organized AVS.    Please see AVS for specific instructions unique to this visit that I personally wrote and verbalized to the the pt in detail and then reviewed with pt  by my nurse highlighting any  changes in therapy recommended at today's visit to their plan of care.

## 2017-05-21 ENCOUNTER — Other Ambulatory Visit: Payer: Self-pay | Admitting: Oncology

## 2017-05-22 ENCOUNTER — Encounter (INDEPENDENT_AMBULATORY_CARE_PROVIDER_SITE_OTHER): Payer: Medicare Other | Admitting: Ophthalmology

## 2017-05-22 DIAGNOSIS — H318 Other specified disorders of choroid: Secondary | ICD-10-CM

## 2017-05-22 DIAGNOSIS — H353122 Nonexudative age-related macular degeneration, left eye, intermediate dry stage: Secondary | ICD-10-CM | POA: Diagnosis not present

## 2017-05-22 DIAGNOSIS — D3131 Benign neoplasm of right choroid: Secondary | ICD-10-CM

## 2017-05-22 DIAGNOSIS — H35033 Hypertensive retinopathy, bilateral: Secondary | ICD-10-CM

## 2017-05-22 DIAGNOSIS — H43813 Vitreous degeneration, bilateral: Secondary | ICD-10-CM

## 2017-05-22 DIAGNOSIS — I10 Essential (primary) hypertension: Secondary | ICD-10-CM | POA: Diagnosis not present

## 2017-05-27 ENCOUNTER — Encounter: Payer: Self-pay | Admitting: Oncology

## 2017-05-27 ENCOUNTER — Ambulatory Visit (INDEPENDENT_AMBULATORY_CARE_PROVIDER_SITE_OTHER): Payer: Medicare Other | Admitting: Oncology

## 2017-05-27 ENCOUNTER — Other Ambulatory Visit: Payer: Self-pay

## 2017-05-27 ENCOUNTER — Encounter (INDEPENDENT_AMBULATORY_CARE_PROVIDER_SITE_OTHER): Payer: Self-pay

## 2017-05-27 VITALS — BP 88/49 | HR 74 | Temp 97.4°F | Ht 69.0 in | Wt 214.8 lb

## 2017-05-27 DIAGNOSIS — Z887 Allergy status to serum and vaccine status: Secondary | ICD-10-CM

## 2017-05-27 DIAGNOSIS — Z8711 Personal history of peptic ulcer disease: Secondary | ICD-10-CM | POA: Diagnosis not present

## 2017-05-27 DIAGNOSIS — Z886 Allergy status to analgesic agent status: Secondary | ICD-10-CM

## 2017-05-27 DIAGNOSIS — I482 Chronic atrial fibrillation: Secondary | ICD-10-CM

## 2017-05-27 DIAGNOSIS — K648 Other hemorrhoids: Secondary | ICD-10-CM

## 2017-05-27 DIAGNOSIS — I10 Essential (primary) hypertension: Secondary | ICD-10-CM | POA: Diagnosis not present

## 2017-05-27 DIAGNOSIS — Z87891 Personal history of nicotine dependence: Secondary | ICD-10-CM

## 2017-05-27 DIAGNOSIS — K31819 Angiodysplasia of stomach and duodenum without bleeding: Secondary | ICD-10-CM

## 2017-05-27 DIAGNOSIS — Z9889 Other specified postprocedural states: Secondary | ICD-10-CM | POA: Diagnosis not present

## 2017-05-27 DIAGNOSIS — Q2733 Arteriovenous malformation of digestive system vessel: Principal | ICD-10-CM

## 2017-05-27 DIAGNOSIS — Z800 Family history of malignant neoplasm of digestive organs: Secondary | ICD-10-CM | POA: Diagnosis not present

## 2017-05-27 DIAGNOSIS — K5520 Angiodysplasia of colon without hemorrhage: Secondary | ICD-10-CM

## 2017-05-27 DIAGNOSIS — Z7901 Long term (current) use of anticoagulants: Secondary | ICD-10-CM | POA: Diagnosis not present

## 2017-05-27 DIAGNOSIS — Z888 Allergy status to other drugs, medicaments and biological substances status: Secondary | ICD-10-CM

## 2017-05-27 DIAGNOSIS — I2510 Atherosclerotic heart disease of native coronary artery without angina pectoris: Secondary | ICD-10-CM | POA: Diagnosis not present

## 2017-05-27 DIAGNOSIS — D500 Iron deficiency anemia secondary to blood loss (chronic): Secondary | ICD-10-CM

## 2017-05-27 LAB — CBC WITH DIFFERENTIAL/PLATELET
BASOS PCT: 1 %
Basophils Absolute: 0.1 10*3/uL (ref 0.0–0.1)
EOS ABS: 0.6 10*3/uL (ref 0.0–0.7)
EOS PCT: 7 %
HCT: 43.1 % (ref 39.0–52.0)
Hemoglobin: 13.4 g/dL (ref 13.0–17.0)
LYMPHS ABS: 1.6 10*3/uL (ref 0.7–4.0)
Lymphocytes Relative: 20 %
MCH: 27.3 pg (ref 26.0–34.0)
MCHC: 31.1 g/dL (ref 30.0–36.0)
MCV: 88 fL (ref 78.0–100.0)
MONO ABS: 0.5 10*3/uL (ref 0.1–1.0)
Monocytes Relative: 6 %
NEUTROS ABS: 5.4 10*3/uL (ref 1.7–7.7)
NEUTROS PCT: 66 %
Platelets: 198 10*3/uL (ref 150–400)
RBC: 4.9 MIL/uL (ref 4.22–5.81)
WBC: 8.2 10*3/uL (ref 4.0–10.5)

## 2017-05-27 LAB — FERRITIN: Ferritin: 47 ng/mL (ref 24–336)

## 2017-05-27 NOTE — Progress Notes (Signed)
Hematology and Oncology Follow Up Visit  George Vega [id number] 19-May-1940 77 y.o. 05/27/2017 3:01 PM   Principle Diagnosis: Encounter Diagnoses  Name Primary?  . AVM (arteriovenous malformation) of colon without hemorrhage Yes  . Gastric AVM   . Iron deficiency anemia due to chronic blood loss   Clinical summary: 77 year old man I recently saw in consultation at [facility] on April 17, 2017 for further evaluation of severe iron deficiency anemia.  Please see my inpatient consult note for complete details of his past medical history. He presented with increasing dyspnea.  He has underlying hypertension, coronary artery disease, chronic atrial fibrillation on Xarelto anticoagulation.  Further evaluation revealed hemoglobin 6.9 down from 14 recorded in January 2018.  He denied any obvious acute or chronic blood loss.  He had remote peptic ulcer disease at age 63.  He was not using any aspirin or nonsteroidals.  He did admit to 2 bourbon drinks daily.  There was a strong family history of colon cancer in his father side.  The patient stated that he has been getting colonoscopies every 3 years.  Most recent study in the epic record was from April 2010.  No significant pathology at that time. He was transfused. He underwent upper endoscopy and colonoscopy on April 17, 2017.  A few nonbleeding AVMs were seen in the stomach and in the colon.  Small nonbleeding internal hemorrhoids. Additional pertinent labs: Ferritin was 10 supporting chronic GI blood loss over the last year. A 510 mg dose of IV iron was given in the hospital and he was discharged on an oral iron supplement.  In view of his age and anticipated ongoing GI blood loss, I recommended decreasing his Xarelto to 15 mg daily. He is done well since discharge.  He never saw any blood or melena and this is not changed.  A follow-up CBC was just done on February 27 and his hemoglobin is up to 12.9 with MCV 83.  Iron studies repeated  today and are pending.  He is otherwise asymptomatic.  Dyspnea has resolved.  No chest pain.  No abdominal pain.  He has a cool Ap on his iPhone that monitors his heart rhythm. Of interest, I take care of his wife and 3 daughters all of whom are homozygous carriers for the C282 hemochromatosis gene but rarely requiring phlebotomies. I took the opportunity while Mr. Buchmann was hospitalized to check his gene profile and he is a heterozygote for the gene.  Medications: reviewed  Allergies:  Allergies  Allergen Reactions  . Glucosamine-Chondroitin Other (See Comments) and Anaphylaxis    Stomach cramps, can't eat Stomach cramps, can't eat  . Tetanus Toxoid Anaphylaxis, Hives and Other (See Comments)    REACTION: hives/throat swells shut REACTION: hives/throat swells shut  . Fish Oil Other (See Comments)    Stomach cramps, can't eat  Stomach cramps, can't eat  . Ibuprofen Nausea Only and Other (See Comments)    Stomach pains Stomach pains  . Naproxen Diarrhea    Review of Systems: See clinical summary Remaining ROS negative:   Physical Exam: Blood pressure (!) 88/49, pulse 74, temperature (!) 97.4 F (36.3 C), temperature source Oral, height 5\' 9"  (1.753 m), weight 214 lb 12.8 oz (97.4 kg), SpO2 94 %. Wt Readings from Last 3 Encounters:  05/27/17 214 lb 12.8 oz (97.4 kg)  05/15/17 212 lb (96.2 kg)  04/24/17 219 lb 6.4 oz (99.5 kg)     General appearance: Well-nourished Caucasian man HENNT: Pharynx no erythema,  exudate, mass, or ulcer. No thyromegaly or thyroid nodules Lymph nodes: No cervical, supraclavicular, or axillary lymphadenopathy Breasts:  Lungs: Clear to auscultation, resonant to percussion throughout Heart: Regular rhythm, no murmur, no gallop, no rub, no click, no edema Abdomen: Soft, nontender, normal bowel sounds, no mass, no organomegaly Extremities: No edema, no calf tenderness Musculoskeletal: no joint deformities GU:  Vascular: Carotid pulses 2+, no  bruits, symmetric Neurologic: Alert, oriented, PERRLA, Skin: No rash or ecchymosis  Lab Results: CBC W/Diff    Component Value Date/Time   WBC 9.1 05/08/2017 0828   WBC 8.2 04/18/2017 0733   RBC 5.03 05/08/2017 0828   RBC 3.89 (L) 04/18/2017 0733   HGB 12.9 (L) 05/08/2017 0828   HCT 41.8 05/08/2017 0828   PLT 304 05/08/2017 0828   MCV 83 05/08/2017 0828   MCH 25.6 (L) 05/08/2017 0828   MCH 22.1 (L) 04/18/2017 0733   MCHC 30.9 (L) 05/08/2017 0828   MCHC 28.5 (L) 04/18/2017 0733   RDW 19.9 (H) 04/18/2017 0733   RDW 13.7 03/15/2016 1039   LYMPHSABS 2.1 05/08/2017 0828   MONOABS 0.9 04/15/2017 1636   EOSABS 0.6 (H) 05/08/2017 0828   BASOSABS 0.1 05/08/2017 0828     Chemistry      Component Value Date/Time   NA 140 05/08/2017 0828   K 4.1 05/08/2017 0828   CL 99 05/08/2017 0828   CO2 27 05/08/2017 0828   BUN 17 05/08/2017 0828   CREATININE 1.00 05/08/2017 0828      Component Value Date/Time   CALCIUM 9.5 05/08/2017 0828   ALKPHOS 97 04/15/2017 1636   AST 12 04/15/2017 1636   ALT 9 04/15/2017 1636   BILITOT 0.5 04/15/2017 1636          Impression:  Occult GI bleeding secondary to previously undiagnosed AVMs of the stomach and colon.  Small bowel not evaluated. Now stable after transfusion, and IV iron.  I went ahead and scheduled a second dose of IV iron for later this week. I will see him again in 6 months and repeat lab in anticipation of the visit.  He may need periodic IV iron supplementation based on his pattern of blood loss over the next few months.  CC: Patient Care Team: [name], MD as PCP - General (Family Medicine) Luanne Bras, MD as Consulting Physician (Interventional Radiology)   Murriel Hopper, MD, [place]  Hematology-Oncology/Internal Medicine     3/18/20193:01 PM

## 2017-05-27 NOTE — Patient Instructions (Signed)
Go to short stay at [facility] for second dose of IV iron Lab in 6 months visit 1 week after lab

## 2017-05-28 ENCOUNTER — Telehealth: Payer: Self-pay | Admitting: *Deleted

## 2017-05-28 ENCOUNTER — Encounter: Payer: Medicare Other | Admitting: Oncology

## 2017-05-28 NOTE — Telephone Encounter (Signed)
-----   Message from Annia Belt, MD sent at 05/27/2017  4:37 PM EDT ----- Call pt: Hb up to 13.4 and iron stores looking good - up to 43 from 10

## 2017-05-28 NOTE — Telephone Encounter (Signed)
Pt called / informed "Hb up to 13.4 and iron stores looking good - up to 43 from 10" per Dr Beryle Beams. Stated "great" ; has feraheme appt on Thursday.

## 2017-05-29 ENCOUNTER — Other Ambulatory Visit: Payer: Self-pay | Admitting: Oncology

## 2017-05-29 DIAGNOSIS — Q2733 Arteriovenous malformation of digestive system vessel: Secondary | ICD-10-CM

## 2017-05-29 DIAGNOSIS — K5520 Angiodysplasia of colon without hemorrhage: Secondary | ICD-10-CM

## 2017-05-29 DIAGNOSIS — K31819 Angiodysplasia of stomach and duodenum without bleeding: Secondary | ICD-10-CM

## 2017-05-29 DIAGNOSIS — D500 Iron deficiency anemia secondary to blood loss (chronic): Secondary | ICD-10-CM

## 2017-05-30 ENCOUNTER — Ambulatory Visit (HOSPITAL_COMMUNITY)
Admission: RE | Admit: 2017-05-30 | Discharge: 2017-05-30 | Disposition: A | Discharge status: Home / Self Care | Payer: Medicare Other | Source: Ambulatory Visit | Attending: Oncology | Admitting: Oncology

## 2017-05-30 DIAGNOSIS — K31819 Angiodysplasia of stomach and duodenum without bleeding: Secondary | ICD-10-CM

## 2017-05-30 DIAGNOSIS — Q2733 Arteriovenous malformation of digestive system vessel: Secondary | ICD-10-CM | POA: Diagnosis not present

## 2017-05-30 DIAGNOSIS — D500 Iron deficiency anemia secondary to blood loss (chronic): Secondary | ICD-10-CM | POA: Insufficient documentation

## 2017-05-30 DIAGNOSIS — K5520 Angiodysplasia of colon without hemorrhage: Secondary | ICD-10-CM

## 2017-05-30 MED ORDER — SODIUM CHLORIDE 0.9 % IV SOLN
510.0000 mg | Freq: Once | INTRAVENOUS | Status: AC
  Administered 2017-05-30: 12:00:00 510 mg via INTRAVENOUS
  Filled 2017-05-30: qty 17

## 2017-06-06 ENCOUNTER — Encounter: Payer: Self-pay | Admitting: Internal Medicine

## 2017-06-19 ENCOUNTER — Encounter (INDEPENDENT_AMBULATORY_CARE_PROVIDER_SITE_OTHER): Payer: Medicare Other | Admitting: Ophthalmology

## 2017-06-19 DIAGNOSIS — H35033 Hypertensive retinopathy, bilateral: Secondary | ICD-10-CM

## 2017-06-19 DIAGNOSIS — I10 Essential (primary) hypertension: Secondary | ICD-10-CM

## 2017-06-19 DIAGNOSIS — H353122 Nonexudative age-related macular degeneration, left eye, intermediate dry stage: Secondary | ICD-10-CM | POA: Diagnosis not present

## 2017-06-19 DIAGNOSIS — D3131 Benign neoplasm of right choroid: Secondary | ICD-10-CM | POA: Diagnosis not present

## 2017-06-19 DIAGNOSIS — H43813 Vitreous degeneration, bilateral: Secondary | ICD-10-CM | POA: Diagnosis not present

## 2017-06-19 DIAGNOSIS — H318 Other specified disorders of choroid: Secondary | ICD-10-CM

## 2017-06-20 ENCOUNTER — Other Ambulatory Visit: Payer: Self-pay | Admitting: Oncology

## 2017-06-25 ENCOUNTER — Other Ambulatory Visit: Payer: Self-pay | Admitting: Oncology

## 2017-07-08 ENCOUNTER — Other Ambulatory Visit (HOSPITAL_COMMUNITY): Payer: Self-pay | Admitting: Orthopedic Surgery

## 2017-07-08 DIAGNOSIS — M25569 Pain in unspecified knee: Secondary | ICD-10-CM

## 2017-07-15 ENCOUNTER — Other Ambulatory Visit (HOSPITAL_COMMUNITY): Payer: Self-pay | Admitting: Interventional Radiology

## 2017-07-15 ENCOUNTER — Encounter (HOSPITAL_COMMUNITY)
Admission: RE | Admit: 2017-07-15 | Discharge: 2017-07-15 | Disposition: A | Discharge status: Home / Self Care | Payer: Medicare Other | Source: Ambulatory Visit | Attending: Orthopedic Surgery | Admitting: Orthopedic Surgery

## 2017-07-15 ENCOUNTER — Ambulatory Visit (HOSPITAL_COMMUNITY)
Admission: RE | Admit: 2017-07-15 | Discharge: 2017-07-15 | Disposition: A | Discharge status: Home / Self Care | Payer: Medicare Other | Source: Ambulatory Visit | Attending: Orthopedic Surgery | Admitting: Orthopedic Surgery

## 2017-07-15 ENCOUNTER — Ambulatory Visit: Payer: Medicare Other | Admitting: Internal Medicine

## 2017-07-15 DIAGNOSIS — M25561 Pain in right knee: Secondary | ICD-10-CM | POA: Diagnosis not present

## 2017-07-15 DIAGNOSIS — G8929 Other chronic pain: Secondary | ICD-10-CM | POA: Insufficient documentation

## 2017-07-15 DIAGNOSIS — M65861 Other synovitis and tenosynovitis, right lower leg: Secondary | ICD-10-CM | POA: Diagnosis not present

## 2017-07-15 DIAGNOSIS — Z96651 Presence of right artificial knee joint: Secondary | ICD-10-CM | POA: Diagnosis not present

## 2017-07-15 DIAGNOSIS — I771 Stricture of artery: Secondary | ICD-10-CM

## 2017-07-15 DIAGNOSIS — M25569 Pain in unspecified knee: Secondary | ICD-10-CM

## 2017-07-15 DIAGNOSIS — T84032D Mechanical loosening of internal right knee prosthetic joint, subsequent encounter: Secondary | ICD-10-CM | POA: Diagnosis present

## 2017-07-15 MED ORDER — TECHNETIUM TC 99M MEDRONATE IV KIT
20.0000 | PACK | Freq: Once | INTRAVENOUS | Status: AC | PRN
  Administered 2017-07-15: 20 via INTRAVENOUS

## 2017-07-17 ENCOUNTER — Encounter (INDEPENDENT_AMBULATORY_CARE_PROVIDER_SITE_OTHER): Payer: Medicare Other | Admitting: Ophthalmology

## 2017-07-17 DIAGNOSIS — H43813 Vitreous degeneration, bilateral: Secondary | ICD-10-CM

## 2017-07-17 DIAGNOSIS — H35033 Hypertensive retinopathy, bilateral: Secondary | ICD-10-CM | POA: Diagnosis not present

## 2017-07-17 DIAGNOSIS — I10 Essential (primary) hypertension: Secondary | ICD-10-CM | POA: Diagnosis not present

## 2017-07-17 DIAGNOSIS — D3131 Benign neoplasm of right choroid: Secondary | ICD-10-CM | POA: Diagnosis not present

## 2017-07-17 DIAGNOSIS — H318 Other specified disorders of choroid: Secondary | ICD-10-CM

## 2017-07-17 DIAGNOSIS — H353122 Nonexudative age-related macular degeneration, left eye, intermediate dry stage: Secondary | ICD-10-CM | POA: Diagnosis not present

## 2017-07-18 ENCOUNTER — Other Ambulatory Visit: Payer: Self-pay | Admitting: Oncology

## 2017-08-06 ENCOUNTER — Ambulatory Visit (HOSPITAL_COMMUNITY)
Admission: RE | Admit: 2017-08-06 | Discharge: 2017-08-06 | Disposition: A | Discharge status: Home / Self Care | Payer: Medicare Other | Source: Ambulatory Visit | Attending: Interventional Radiology | Admitting: Interventional Radiology

## 2017-08-06 DIAGNOSIS — I6523 Occlusion and stenosis of bilateral carotid arteries: Secondary | ICD-10-CM | POA: Diagnosis not present

## 2017-08-06 DIAGNOSIS — I771 Stricture of artery: Secondary | ICD-10-CM

## 2017-08-06 NOTE — Progress Notes (Signed)
Preliminary results by tech - Carotid Duplex Completed. No evidence of a significant stenosis in bilateral carotid arteries, 1-39%. Right vertebral artery is patent, left vertebral artery appears occluded.  Oda Cogan, BS, RDMS, RVT

## 2017-08-14 ENCOUNTER — Encounter (INDEPENDENT_AMBULATORY_CARE_PROVIDER_SITE_OTHER): Payer: Medicare Other | Admitting: Ophthalmology

## 2017-08-14 DIAGNOSIS — D3131 Benign neoplasm of right choroid: Secondary | ICD-10-CM

## 2017-08-14 DIAGNOSIS — I10 Essential (primary) hypertension: Secondary | ICD-10-CM | POA: Diagnosis not present

## 2017-08-14 DIAGNOSIS — H43813 Vitreous degeneration, bilateral: Secondary | ICD-10-CM | POA: Diagnosis not present

## 2017-08-14 DIAGNOSIS — H35033 Hypertensive retinopathy, bilateral: Secondary | ICD-10-CM | POA: Diagnosis not present

## 2017-08-14 DIAGNOSIS — H318 Other specified disorders of choroid: Secondary | ICD-10-CM | POA: Diagnosis not present

## 2017-08-14 DIAGNOSIS — H353122 Nonexudative age-related macular degeneration, left eye, intermediate dry stage: Secondary | ICD-10-CM

## 2017-08-24 ENCOUNTER — Other Ambulatory Visit: Payer: Self-pay | Admitting: Internal Medicine

## 2017-08-28 ENCOUNTER — Other Ambulatory Visit: Payer: Self-pay

## 2017-08-28 ENCOUNTER — Telehealth (HOSPITAL_COMMUNITY): Payer: Self-pay

## 2017-08-28 ENCOUNTER — Emergency Department (HOSPITAL_COMMUNITY)
Admission: EM | Admit: 2017-08-28 | Discharge: 2017-08-29 | Disposition: A | Discharge status: Home / Self Care | Payer: Medicare Other | Attending: Emergency Medicine | Admitting: Emergency Medicine

## 2017-08-28 ENCOUNTER — Emergency Department (HOSPITAL_COMMUNITY): Payer: Medicare Other

## 2017-08-28 ENCOUNTER — Encounter (HOSPITAL_COMMUNITY): Payer: Self-pay

## 2017-08-28 DIAGNOSIS — I2510 Atherosclerotic heart disease of native coronary artery without angina pectoris: Secondary | ICD-10-CM | POA: Insufficient documentation

## 2017-08-28 DIAGNOSIS — I10 Essential (primary) hypertension: Secondary | ICD-10-CM | POA: Diagnosis not present

## 2017-08-28 DIAGNOSIS — Z7901 Long term (current) use of anticoagulants: Secondary | ICD-10-CM | POA: Diagnosis not present

## 2017-08-28 DIAGNOSIS — I4891 Unspecified atrial fibrillation: Secondary | ICD-10-CM | POA: Diagnosis not present

## 2017-08-28 DIAGNOSIS — Z79899 Other long term (current) drug therapy: Secondary | ICD-10-CM | POA: Insufficient documentation

## 2017-08-28 DIAGNOSIS — Z8673 Personal history of transient ischemic attack (TIA), and cerebral infarction without residual deficits: Secondary | ICD-10-CM | POA: Insufficient documentation

## 2017-08-28 DIAGNOSIS — R0602 Shortness of breath: Secondary | ICD-10-CM | POA: Diagnosis present

## 2017-08-28 DIAGNOSIS — R0789 Other chest pain: Secondary | ICD-10-CM | POA: Diagnosis not present

## 2017-08-28 DIAGNOSIS — Z87891 Personal history of nicotine dependence: Secondary | ICD-10-CM | POA: Diagnosis not present

## 2017-08-28 LAB — CBC
HCT: 43 % (ref 39.0–52.0)
Hemoglobin: 14.1 g/dL (ref 13.0–17.0)
MCH: 32 pg (ref 26.0–34.0)
MCHC: 32.8 g/dL (ref 30.0–36.0)
MCV: 97.5 fL (ref 78.0–100.0)
PLATELETS: 223 10*3/uL (ref 150–400)
RBC: 4.41 MIL/uL (ref 4.22–5.81)
RDW: 13.3 % (ref 11.5–15.5)
WBC: 8.9 10*3/uL (ref 4.0–10.5)

## 2017-08-28 LAB — I-STAT TROPONIN, ED: Troponin i, poc: 0 ng/mL (ref 0.00–0.08)

## 2017-08-28 LAB — BASIC METABOLIC PANEL
Anion gap: 9 (ref 5–15)
BUN: 15 mg/dL (ref 6–20)
CHLORIDE: 102 mmol/L (ref 101–111)
CO2: 27 mmol/L (ref 22–32)
CREATININE: 1.03 mg/dL (ref 0.61–1.24)
Calcium: 9.3 mg/dL (ref 8.9–10.3)
GFR calc Af Amer: >60 mL/min (ref >60)
Glucose, Bld: 114 mg/dL — ABNORMAL HIGH (ref 65–99)
POTASSIUM: 4.3 mmol/L (ref 3.5–5.1)
SODIUM: 138 mmol/L (ref 135–145)

## 2017-08-28 NOTE — ED Notes (Signed)
Patient transported to X-ray 

## 2017-08-28 NOTE — Telephone Encounter (Signed)
Pt agreed to f/u in 6 months with us carotid. AW 

## 2017-08-28 NOTE — ED Provider Notes (Signed)
[facility] EMERGENCY DEPARTMENT Provider Note   CSN: [id number] Arrival date & time: 08/28/17  2130     History   Chief Complaint Chief Complaint  Patient presents with  . Atrial Fibrillation  . Shortness of Breath  . Chest Pain    HPI George Vega is a 77 y.o. male.  Patient is a 77 year old male with a history of atrial fibrillation on Tikosyn, coronary artery disease, stroke, hypertension who is presenting today with intermittent episodes since 3 PM of heart racing up to 180 bpm, shortness of breath and chest tightness.  The symptoms are worse with exertion but would occur at rest.  Patient states he checks his heart rate with his iPhone and when it is elevated as when he is experiencing the shortness of breath and chest discomfort.  He realized that he had forgotten to take his morning medications so took them at around 8 PM.  He is starting to feel much better and currently is symptom-free.  He denies any recent cough, congestion, fever, lower extremity swelling, unilateral leg pain or swelling.  He has had no dark stools or abdominal pain.  The history is provided by the patient.  Atrial Fibrillation  This is a recurrent problem. The current episode started 3 to 5 hours ago. The problem occurs constantly. The problem has been resolved. Associated symptoms include chest pain and shortness of breath. Nothing aggravates the symptoms. The symptoms are relieved by medications. Treatments tried: forgot his Tikosyn this morning so took a dose at 8pm and now feeling better.  Shortness of Breath  Associated symptoms include chest pain.  Chest Pain   Associated symptoms include shortness of breath.    Past Medical History:  Diagnosis Date  . Arthritis   . Atrial fibrillation, persistent ([place])   . Chronic sinusitis   . Coronary artery disease   . Depression   . Difficult intubation    was told with shoulder done 2006-alittle narrow  . Gait disorder 05/28/2014   . Hypertension   . Jejunostomy tube fell out    when asked about this in 04/2017, pt denied ever having a J tube, feeding tube, tubes placed post surgery so ??? veracity of a previous J tube.    . 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 Spastic colon   . Stroke (cerebrum) ([place])   . SVT (supraventricular tachycardia) [facility])     Patient Active Problem List   Diagnosis Date Noted  . AVM (arteriovenous malformation) of colon without hemorrhage   . Gastric AVM   . Chronic anticoagulation   . Iron deficiency anemia due to chronic blood loss   . Symptomatic anemia 04/16/2017  . 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 Prediabetes 01/30/2017  . Vitamin D deficiency 01/30/2017  . Multiple pulmonary nodules determined by computed tomography of lung 10/09/2016  . Pulmonary nodules 10/09/2016  . Persistent atrial fibrillation ([place]) 03/19/2016  . Upper airway cough syndrome 01/21/2016  . Dyspnea on exertion 01/20/2016  . SVT (supraventricular tachycardia) ([place]) 11/01/2015  . Atrial tachycardia ([place]) 09/08/2015  . 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 BPH (benign prostatic hyperplasia) 05/19/2014  . CVA (cerebral vascular accident) ([place]) 02/01/2014  . Benign paroxysmal positional vertigo 01/12/2014  . Headache 01/12/2014  . DTs (delirium tremens) ([place]) 09/26/2013  . 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 Ataxia 04/02/2013  .  Leukocytosis 04/02/2013  . CAD (coronary artery disease) 04/02/2013  . Hyperlipemia 04/02/2013  . HTN (hypertension) 04/02/2013  . Abnormal MRA, brain 11/06/2012  . Arthralgia 09/02/2012  . Synovitis of wrist 07/25/2012  . OA (osteoarthritis) 07/25/2012  . Spastic colon 05/03/2011  . Arthritis 05/03/2011  . Coronary atherosclerosis 11/04/2008  . DEPRESSION 11/03/2008  . DYSTHYMIC DISORDER 10/13/2008  . STRESS ELECTROCARDIOGRAM,  ABNORMAL 10/13/2008    Past Surgical History:  Procedure Laterality Date  . COLONOSCOPY WITH PROPOFOL N/A 04/17/2017   Procedure: COLONOSCOPY WITH PROPOFOL;  Surgeon: Yetta Flock, MD;  Location: [place];  Service: Gastroenterology;  Laterality: N/A;  . ESOPHAGEAL DILATION  2017  . ESOPHAGOGASTRODUODENOSCOPY (EGD) WITH PROPOFOL N/A 04/17/2017   Procedure: ESOPHAGOGASTRODUODENOSCOPY (EGD) WITH PROPOFOL;  Surgeon: Yetta Flock, MD;  Location: [place];  Service: Gastroenterology;  Laterality: N/A;  . FOOT NEUROMA SURGERY  2002  . LEFT HEART CATHETERIZATION WITH CORONARY ANGIOGRAM Right 06/14/2011   20% LM, chronic occluded mid LAD, 50% ostial LCX, 20% mid RI, RCA with collaterals to mid LAD, mid 10% stenosis, EF 60% 06/14/11  . NASAL SINUS SURGERY    . SHOULDER ARTHROSCOPY  03/01/2011   Procedure: ARTHROSCOPY SHOULDER;  Surgeon: Ninetta Lights, MD;  Location: [place];  Service: Orthopedics;  Laterality: Right;  arthroscopy shoulder decompression subacromial partial acromioplasty with coracoacromial release, distal claviculectomy, debridement of labrium  . SHOULDER ARTHROSCOPY  03/01/2011   Procedure: ARTHROSCOPY SHOULDER;  Surgeon: Ninetta Lights, MD;  Location: [place];  Service: Orthopedics;  Laterality: Right;  arthroscopy shoulder decompression subacromial partial acromioplasty with coracoacromial release, distal claviculectomy, debridement of labrium  . TEE WITHOUT CARDIOVERSION N/A 03/19/2016   Procedure: TRANSESOPHAGEAL ECHOCARDIOGRAM (TEE);  Surgeon: Larey Dresser, MD;  Location: [place];  Service: Cardiovascular;  Laterality: N/A;  . TOTAL KNEE ARTHROPLASTY Right 09/23/2013   Procedure: TOTAL KNEE ARTHROPLASTY;  Surgeon: Ninetta Lights, MD;  Location: [place];  Service: Orthopedics;  Laterality: Right;  . TOTAL SHOULDER ARTHROPLASTY  06/28/2011   Procedure: TOTAL SHOULDER ARTHROPLASTY;  Surgeon: Ninetta Lights, MD;  Location:  [place];  Service: Orthopedics;  Laterality: Right;  . UVULOPALATOPHARYNGOPLASTY          Home Medications    Prior to Admission medications   Medication Sig Start Date End Date Taking? Authorizing Provider  acetaminophen (TYLENOL) 325 MG tablet Take 650 mg by mouth every 6 (six) hours as needed for moderate pain.    [provider]  amLODipine (NORVASC) 5 MG tablet TAKE 1 TABLET BY MOUTH  DAILY 02/14/17   Evans Lance, MD  Cholecalciferol (VITAMIN D3) 5000 units TABS Take 5,000 Units by mouth daily.    [provider]  Cyanocobalamin (VITAMIN B-12) 500 MCG SUBL Place 500 mcg under the tongue 3 (three) times a week.     [provider]  dofetilide (TIKOSYN) 250 MCG capsule TAKE 1 CAPSULE BY MOUTH TWO TIMES DAILY 02/14/17   Evans Lance, MD  FEROSUL 325 (65 Fe) MG tablet TAKE 1 TABLET(325 MG) BY MOUTH TWICE DAILY 06/25/17   Annia Belt, MD  furosemide (LASIX) 40 MG tablet Take 1 tablet (40 mg total) by mouth daily. 04/24/17 07/23/17  Evans Lance, MD  irbesartan (AVAPRO) 150 MG tablet TAKE 1 TABLET(150 MG) BY MOUTH DAILY 12/31/16   Evans Lance, MD  LORazepam (ATIVAN) 0.5 MG tablet Take 1/2 to 1 tablet daily as needed for acute anxiety. Limit use. Do  no mix w/ trazodone. 02/26/17 02/27/20  [provider]  magnesium oxide (MAG-OX) 400 MG tablet Take 400 mg by mouth daily.    [provider]  Melatonin 10 MG TABS Take 10 mg by mouth at bedtime as needed (sleep).     [provider]  metoprolol tartrate (LOPRESSOR) 25 MG tablet TAKE 1 TABLET BY MOUTH TWO  TIMES DAILY 02/14/17   Evans Lance, MD  Multiple Vitamins-Minerals (PRESERVISION AREDS 2 PO) Take 1 capsule by mouth 2 (two) times daily.    [provider]  pantoprazole (PROTONIX) 40 MG tablet TAKE 1 TABLET(40 MG) BY MOUTH DAILY 02/22/17   [provider]  PARoxetine (PAXIL) 30 MG tablet Take 40 mg by mouth at bedtime.  01/25/17    [provider]  polycarbophil (FIBERCON) 625 MG tablet Take 1,250 mg by mouth at bedtime.     [provider]  Polyethyl Glycol-Propyl Glycol (SYSTANE OP) Place 2 drops into both eyes 2 (two) times daily as needed (dry eyes).     [provider]  potassium chloride (K-DUR) 10 MEQ tablet Take 1 tablet (10 mEq total) by mouth daily. 04/24/17   Evans Lance, MD  rosuvastatin (CRESTOR) 5 MG tablet Take 1 tablet (5 mg total) by mouth every morning. 11/20/16   Evans Lance, MD  tamsulosin (FLOMAX) 0.4 MG CAPS capsule Take 0.4 mg by mouth daily. 12/04/16   [provider]  traZODone (DESYREL) 50 MG tablet TAKE 1/2 TO 1 TABLET BY MOUTH AS NEEDED FOR SLEEP 03/08/17   [provider]  XARELTO 15 MG TABS tablet TAKE 1 TABLET(15 MG) BY MOUTH DAILY WITH SUPPER 08/27/17   Annia Belt, MD    Family History Family History  Problem Relation Age of Onset  . Cancer Mother        colon  . Heart attack Father   . Migraines Brother     Social History Social History   Tobacco Use  . Smoking status: Former Smoker    Packs/day: 2.00    Years: 30.00    Pack years: 60.00    Types: Cigarettes    Last attempt to quit: 02/27/1998    Years since quitting: 19.5  . Smokeless tobacco: Never Used  Substance Use Topics  . Alcohol use: Yes    Alcohol/week: 8.4 oz    Types: 14 Standard drinks or equivalent per week    Comment: daily  . Drug use: No     Allergies   Glucosamine-chondroitin; Tetanus toxoid; Fish oil; Ibuprofen; and Naproxen   Review of Systems Review of Systems  Respiratory: Positive for shortness of breath.   Cardiovascular: Positive for chest pain.  All other systems reviewed and are negative.    Physical Exam Updated Vital Signs BP 104/63   Pulse 79   Resp 18   Ht 5\' 10"  (1.778 m)   Wt 98 kg (216 lb)   SpO2 94%   BMI 30.99 kg/m   Physical Exam  Constitutional: He is oriented to person, place, and time. He appears  well-developed and well-nourished. No distress.  HENT:  Head: Normocephalic and atraumatic.  Mouth/Throat: Oropharynx is clear and moist.  Eyes: Pupils are equal, round, and reactive to light. Conjunctivae and EOM are normal.  Neck: Normal range of motion. Neck supple.  Cardiovascular: Normal rate, regular rhythm and intact distal pulses.  No murmur heard. Pulmonary/Chest: Effort normal and breath sounds normal. No respiratory distress. He has no wheezes. He has no  rales.  Abdominal: Soft. He exhibits no distension. There is no tenderness. There is no rebound and no guarding.  Musculoskeletal: Normal range of motion. He exhibits no edema or tenderness.  Neurological: He is alert and oriented to person, place, and time.  Skin: Skin is warm and dry. No rash noted. No erythema.  Psychiatric: He has a normal mood and affect. His behavior is normal.  Nursing note and vitals reviewed.    ED Treatments / Results  Labs (all labs ordered are listed, but only abnormal results are displayed) Labs Reviewed  BASIC METABOLIC PANEL - Abnormal; Notable for the following components:      Result Value   Glucose, Bld 114 (*)    All other components within normal limits  CBC  I-STAT TROPONIN, ED    EKG EKG Interpretation  Date/Time:  Wednesday August 28 2017 23:14:42 EDT Ventricular Rate:  79 PR Interval:    QRS Duration: 98 QT Interval:  414 QTC Calculation: 475 R Axis:   -15 Text Interpretation:  Sinus rhythm Borderline left axis deviation Low voltage, precordial leads ectopy has resolved Confirmed by Blanchie Dessert [id number]) on 08/28/2017 11:33:44 PM   Radiology Dg Chest 2 View  Result Date: 08/28/2017 CLINICAL DATA:  Central chest pain and tightness, shortness of breath and atrial fibrillation. History of hypertension, former smoker. EXAM: CHEST - 2 VIEW COMPARISON:  Chest radiograph April 16, 2017 FINDINGS: Stable cardiomegaly. Coronary artery calcification versus stent. Calcified  aortic arch. Mild chronic interstitial changes. Persistently elevated RIGHT hemidiaphragm. No pleural effusion or focal consolidation. No pneumothorax. Osteopenia. Bilateral humeral arthroplasties. IMPRESSION: Stable cardiomegaly. Stable chronic interstitial changes. Aortic Atherosclerosis (ICD10-I70.0). Electronically Signed   By: Elon Alas M.D.   On: 08/28/2017 22:28    Procedures Procedures (including critical care time)  Medications Ordered in ED Medications - No data to display   Initial Impression / Assessment and Plan / ED Course  I have reviewed the triage vital signs and the nursing notes.  Pertinent labs & imaging results that were available during my care of the patient were reviewed by me and considered in my medical decision making (see chart for details).     Patient is a 77 year old male presenting today with intermittent episodes of shortness of breath and chest tightness associated with a rapid heart rate.  Patient does have a history of paroxysmal atrial fibrillation for which he takes Tikosyn for.  He forgot his morning medications and started having symptoms around 3 PM.  He did take his medication at 8 PM.  Upon arrival here patient has converted back to sinus rhythm and his symptoms have resolved.  When EMS picked up the patient his heart rate was in the 180s when he was having chest discomfort and shortness of breath.  Feel patient's chest discomfort and shortness of breath are related to A. fib with RVR.  Patient has had 2 EKGs initial one showed some ectopy with PVCs and PACs but second EKG those have resolved.  Patient lab work is reassuring with normal potassium, sodium, hemoglobin.  Troponin is negative and chest x-ray without acute findings.  Patient was ambulated without recurrence of symptoms.  Stressed importance of not forgetting his medications and patient was discharged home with family.  Final Clinical Impressions(s) / ED Diagnoses   Final diagnoses:    Atrial fibrillation with RVR [facility])    ED Discharge Orders    None       Blanchie Dessert, MD 08/28/17 2346

## 2017-08-28 NOTE — ED Triage Notes (Signed)
Per [facility] pt arrives from home c/o chest soreness and SOB. Pt has A fib and missed morning dose of tikosyn. Pt reports around 1500 episode of SOB and chest soreness 2/10 x1 hr. Pt reports another episode at 1800 of the same, but denies N/V and is not diaphoretic. EMS saw highest HR of 180 which lasted about 1 minute and then decreased to 90's. Pt reports he is still SOB, maintaining at 96% RA and chest is 1/10. Pt is alert and oriented.

## 2017-08-28 NOTE — ED Notes (Signed)
ED Provider at bedside. 

## 2017-08-28 NOTE — ED Notes (Signed)
Pt ambulated down the hall without difficulties or assistance. O2 sats remained at 94% on RA and HR maintained at 83-86 bpm

## 2017-09-03 ENCOUNTER — Telehealth: Payer: Self-pay | Admitting: Internal Medicine

## 2017-09-03 NOTE — Telephone Encounter (Signed)
New Message   Patient c/o Palpitations:  High priority if patient c/o lightheadedness, shortness of breath, or chest pain  1) How long have you had palpitations/irregular HR/ Afib? Are you having the symptoms now? Afib, last about an hour no longer in afib   2) Are you currently experiencing lightheadedness, SOB or CP? no  3) Do you have a history of afib (atrial fibrillation) or irregular heart rhythm? Yes   4) Have you checked your BP or HR? (document readings if available): BP 120/65  5) Are you experiencing any other symptoms? No symptoms

## 2017-09-03 NOTE — Telephone Encounter (Signed)
Spoke with patient who has concerns about his recent Afib rhythm occurrences.  Last Wednesday (6/19), he missed his morning medication regimen.    Later that day @ 6pm, he went into Afib rhythm and at 7:30pm, he decided to call 911 and go to the ED.  At 11pm, he went into NSR.  Today (Tuesday), he experienced Afib from 11-12 and then again at 1:30-2pm.  He has been asymptomatic, BP 120/65, HR in the 160s.  I informed him to go to the ED if he becomes symptomatic.  He has an OV with Dr Lovena Le 7/3.Marland Kitchen

## 2017-09-05 ENCOUNTER — Encounter: Payer: Self-pay | Admitting: Internal Medicine

## 2017-09-05 NOTE — Telephone Encounter (Signed)
Spoke with wife.  Pt unavailable at this time.  Gave this nurse direct number for call back.

## 2017-09-05 NOTE — Telephone Encounter (Signed)
Call back received from Pt.  Pt did not have any further concerns after speaking with Legrand Como.

## 2017-09-10 ENCOUNTER — Encounter (INDEPENDENT_AMBULATORY_CARE_PROVIDER_SITE_OTHER): Payer: Medicare Other | Admitting: Ophthalmology

## 2017-09-10 DIAGNOSIS — I10 Essential (primary) hypertension: Secondary | ICD-10-CM

## 2017-09-10 DIAGNOSIS — H318 Other specified disorders of choroid: Secondary | ICD-10-CM

## 2017-09-10 DIAGNOSIS — D3131 Benign neoplasm of right choroid: Secondary | ICD-10-CM

## 2017-09-10 DIAGNOSIS — H43813 Vitreous degeneration, bilateral: Secondary | ICD-10-CM

## 2017-09-10 DIAGNOSIS — H35033 Hypertensive retinopathy, bilateral: Secondary | ICD-10-CM | POA: Diagnosis not present

## 2017-09-10 DIAGNOSIS — H353122 Nonexudative age-related macular degeneration, left eye, intermediate dry stage: Secondary | ICD-10-CM | POA: Diagnosis not present

## 2017-09-11 ENCOUNTER — Ambulatory Visit (INDEPENDENT_AMBULATORY_CARE_PROVIDER_SITE_OTHER): Payer: Medicare Other | Admitting: Internal Medicine

## 2017-09-11 ENCOUNTER — Encounter: Payer: Self-pay | Admitting: Internal Medicine

## 2017-09-11 VITALS — BP 92/60 | HR 73 | Ht 70.0 in | Wt 216.0 lb

## 2017-09-11 DIAGNOSIS — I4819 Other persistent atrial fibrillation: Secondary | ICD-10-CM

## 2017-09-11 DIAGNOSIS — Z5181 Encounter for therapeutic drug level monitoring: Secondary | ICD-10-CM

## 2017-09-11 DIAGNOSIS — I2510 Atherosclerotic heart disease of native coronary artery without angina pectoris: Secondary | ICD-10-CM | POA: Diagnosis not present

## 2017-09-11 DIAGNOSIS — I481 Persistent atrial fibrillation: Secondary | ICD-10-CM | POA: Diagnosis not present

## 2017-09-11 DIAGNOSIS — Z79899 Other long term (current) drug therapy: Secondary | ICD-10-CM | POA: Diagnosis not present

## 2017-09-11 NOTE — Patient Instructions (Addendum)
Medication Instructions:  Your physician recommends that you continue on your current medications as directed. Please refer to the Current Medication list given to you today.  Labwork: None ordered.  Testing/Procedures: None ordered.  Follow-Up: Your physician wants you to follow-up in: 6 months with Dr. Lovena Le.   You will receive a reminder letter in the mail two months in advance. If you don't receive a letter, please call our office to schedule the follow-up appointment.  Any Other Special Instructions Will Be Listed Below (If Applicable).  A referral has been made to Dr. Curt Bears to discuss atrial fibrillation treatment options. You will get a call from [facility] in scheduling to set up this appointment.  If you need a refill on your cardiac medications before your next appointment, please call your pharmacy.

## 2017-09-11 NOTE — Addendum Note (Signed)
Addended by: Rose Phi on: 09/11/2017 05:09 PM   Modules accepted: Orders

## 2017-09-11 NOTE — Progress Notes (Signed)
HPI George Vega returns today for followup. He is a pleasant 77 man with a h/o HTN, PAF, acute on chronic diastolic heart failure, and GI bleeding. He is on [facility] and has had no additional bleeding. He admits to dietary indiscretion with sodium.   Allergies  Allergen Reactions  . Glucosamine-Chondroitin Other (See Comments) and Anaphylaxis    Stomach cramps, can't eat Stomach cramps, can't eat  . Tetanus Toxoid Anaphylaxis, Hives and Other (See Comments)    REACTION: hives/throat swells shut REACTION: hives/throat swells shut  . Fish Oil Other (See Comments)    Stomach cramps, can't eat  Stomach cramps, can't eat  . Ibuprofen Nausea Only and Other (See Comments)    Stomach pains Stomach pains  . Naproxen Diarrhea     Current Outpatient Medications  Medication Sig Dispense Refill  . acetaminophen (TYLENOL) 325 MG tablet Take 650 mg by mouth every 6 (six) hours as needed for moderate pain.    Marland Kitchen amLODipine (NORVASC) 5 MG tablet TAKE 1 TABLET BY MOUTH  DAILY 90 tablet 3  . Cholecalciferol (VITAMIN D3) 5000 units TABS Take 5,000 Units by mouth daily.    . Cyanocobalamin (VITAMIN B-12) 500 MCG SUBL Place 500 mcg under the tongue 3 (three) times a week.     . dofetilide (TIKOSYN) 250 MCG capsule TAKE 1 CAPSULE BY MOUTH TWO TIMES DAILY 180 capsule 3  . FEROSUL 325 (65 Fe) MG tablet TAKE 1 TABLET(325 MG) BY MOUTH TWICE DAILY 180 tablet 1  . irbesartan (AVAPRO) 150 MG tablet TAKE 1 TABLET(150 MG) BY MOUTH DAILY 90 tablet 3  . LORazepam (ATIVAN) 0.5 MG tablet Take 1/2 to 1 tablet daily as needed for acute anxiety. Limit use. Do no mix w/ trazodone.    . magnesium oxide (MAG-OX) 400 MG tablet Take 400 mg by mouth daily.    . Melatonin 10 MG TABS Take 10 mg by mouth at bedtime as needed (sleep).     . metoprolol tartrate (LOPRESSOR) 25 MG tablet TAKE 1 TABLET BY MOUTH TWO  TIMES DAILY 180 tablet 3  . Multiple Vitamins-Minerals (PRESERVISION AREDS 2 PO) Take 1 capsule by mouth 2 (two)  times daily.    . pantoprazole (PROTONIX) 40 MG tablet TAKE 1 TABLET(40 MG) BY MOUTH DAILY    . PARoxetine (PAXIL) 30 MG tablet Take 40 mg by mouth at bedtime.     . polycarbophil (FIBERCON) 625 MG tablet Take 1,250 mg by mouth at bedtime.     George Vega Glycol-Propyl Glycol (SYSTANE OP) Place 2 drops into both eyes 2 (two) times daily as needed (dry eyes).     . potassium chloride (K-DUR) 10 MEQ tablet Take 1 tablet (10 mEq total) by mouth daily. 90 tablet 3  . rosuvastatin (CRESTOR) 5 MG tablet Take 1 tablet (5 mg total) by mouth every morning. 90 tablet 3  . tamsulosin (FLOMAX) 0.4 MG CAPS capsule Take 0.4 mg by mouth daily.  11  . traZODone (DESYREL) 50 MG tablet TAKE 1/2 TO 1 TABLET BY MOUTH AS NEEDED FOR SLEEP    . XARELTO 15 MG TABS tablet TAKE 1 TABLET(15 MG) BY MOUTH DAILY WITH SUPPER 30 tablet 0  . furosemide (LASIX) 40 MG tablet Take 1 tablet (40 mg total) by mouth daily. 90 tablet 3   No current facility-administered medications for this visit.      Past Medical History:  Diagnosis Date  . Arthritis   . Atrial fibrillation, persistent ([place])   .  Chronic sinusitis   . Coronary artery disease   . Depression   . Difficult intubation    was told with shoulder done 2006-alittle narrow  . 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 Hypertension   . Jejunostomy tube fell out    when asked about this in 04/2017, pt denied ever having a J tube, feeding tube, tubes placed post surgery so ??? veracity of a previous J tube.    . 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 Spastic colon   . Stroke (cerebrum) ([place])   . SVT (supraventricular tachycardia) (HCC)     ROS:   All systems reviewed and negative except as noted in the HPI.   Past Surgical History:  Procedure Laterality Date  . COLONOSCOPY WITH PROPOFOL N/A 04/17/2017   Procedure: COLONOSCOPY WITH PROPOFOL;  Surgeon: Yetta Flock, MD;  Location: [place];  Service: Gastroenterology;  Laterality: N/A;  .  ESOPHAGEAL DILATION  2017  . ESOPHAGOGASTRODUODENOSCOPY (EGD) WITH PROPOFOL N/A 04/17/2017   Procedure: ESOPHAGOGASTRODUODENOSCOPY (EGD) WITH PROPOFOL;  Surgeon: Yetta Flock, MD;  Location: [place];  Service: Gastroenterology;  Laterality: N/A;  . FOOT NEUROMA SURGERY  2002  . LEFT HEART CATHETERIZATION WITH CORONARY ANGIOGRAM Right 06/14/2011   20% LM, chronic occluded mid LAD, 50% ostial LCX, 20% mid RI, RCA with collaterals to mid LAD, mid 10% stenosis, EF 60% 06/14/11  . NASAL SINUS SURGERY    . SHOULDER ARTHROSCOPY  03/01/2011   Procedure: ARTHROSCOPY SHOULDER;  Surgeon: Ninetta Lights, MD;  Location: [place];  Service: Orthopedics;  Laterality: Right;  arthroscopy shoulder decompression subacromial partial acromioplasty with coracoacromial release, distal claviculectomy, debridement of labrium  . SHOULDER ARTHROSCOPY  03/01/2011   Procedure: ARTHROSCOPY SHOULDER;  Surgeon: Ninetta Lights, MD;  Location: [place];  Service: Orthopedics;  Laterality: Right;  arthroscopy shoulder decompression subacromial partial acromioplasty with coracoacromial release, distal claviculectomy, debridement of labrium  . TEE WITHOUT CARDIOVERSION N/A 03/19/2016   Procedure: TRANSESOPHAGEAL ECHOCARDIOGRAM (TEE);  Surgeon: Larey Dresser, MD;  Location: [place];  Service: Cardiovascular;  Laterality: N/A;  . TOTAL KNEE ARTHROPLASTY Right 09/23/2013   Procedure: TOTAL KNEE ARTHROPLASTY;  Surgeon: Ninetta Lights, MD;  Location: [place];  Service: Orthopedics;  Laterality: Right;  . TOTAL SHOULDER ARTHROPLASTY  06/28/2011   Procedure: TOTAL SHOULDER ARTHROPLASTY;  Surgeon: Ninetta Lights, MD;  Location: [place];  Service: Orthopedics;  Laterality: Right;  . UVULOPALATOPHARYNGOPLASTY       Family History  Problem Relation Age of Onset  . Cancer Mother        colon  . Heart attack Father   . Migraines Brother      Social History    Socioeconomic History  . Marital status: Married    Spouse name: Not on file  . Number of children: 3  . Years of education: Not on file  . Highest education level: Not on file  Occupational History  . Occupation: retired  Scientific laboratory technician  . Financial resource strain: Not on file  . Food insecurity:    Worry: Not on file    Inability: Not on file  . Transportation needs:    Medical: Not on file    Non-medical: Not on file  Tobacco Use  . Smoking status: Former Smoker    Packs/day: 2.00    Years: 30.00    Pack years: 60.00    Types: Cigarettes    Last attempt to  quit: 02/27/1998    Years since quitting: 19.5  . Smokeless tobacco: Never Used  Substance and Sexual Activity  . Alcohol use: Yes    Alcohol/week: 8.4 oz    Types: 14 Standard drinks or equivalent per week    Comment: daily  . Drug use: No  . Sexual activity: Not on file  Lifestyle  . Physical activity:    Days per week: Not on file    Minutes per session: Not on file  . Stress: Not on file  Relationships  . Social connections:    Talks on phone: Not on file    Gets together: Not on file    Attends religious service: Not on file    Active member of club or organization: Not on file    Attends meetings of clubs or organizations: Not on file    Relationship status: Not on file  . Intimate partner violence:    Fear of current or ex partner: Not on file    Emotionally abused: Not on file    Physically abused: Not on file    Forced sexual activity: Not on file  Other Topics Concern  . Not on file  Social History Narrative   Patient is left handed.   Patient drinks one cup caffeine daily.     Ht 5\' 10"  (1.778 m)   BMI 30.99 kg/m   Physical Exam:  Well appearing 77 yo man, NAD HEENT: Unremarkable Neck:  6 cm JVD, no thyromegally Lymphatics:  No adenopathy Back:  No CVA tenderness Lungs:  Clear HEART:  Regular rate rhythm, no murmurs, no rubs, no clicks Abd:  soft, positive bowel sounds, no  organomegally, no rebound, no guarding Ext:  2 plus pulses, no edema, no cyanosis, no clubbing Skin:  No rashes no nodules Neuro:  CN II through XII intact, motor grossly intact  EKG - NSR with prlonged QTC of 480 by my calc.   Assess/Plan: 1. PAF - he appears to be having increasingly frequent episodes. I will refer him to consider PVI. 2. GI bleeding - he has not had any more. He will undergo watchful waiting and continue his xarelto for now. 3. Dyslipidemia - he will continue his statin therapy. 4. Chronic diastolic heart failure - his symptoms are well controlled. He is encouraged to maintain a low sodium diet.  Mikle Bosworth.D.

## 2017-09-23 ENCOUNTER — Other Ambulatory Visit: Payer: Self-pay | Admitting: Oncology

## 2017-10-09 NOTE — Progress Notes (Signed)
Electrophysiology Office Note   Date:  10/10/2017   ID:  [name], [name] [birth year], MRN [id number]  PCP:  Dineen Kid, MD  Cardiologist:  Lovena Le Primary Electrophysiologist: Lovena Le    No chief complaint on file.    History of Present Illness: George Vega is a 77 y.o. male who is being seen today for the evaluation of atrial fibrillation at the request of Cristopher Peru. Presenting today for electrophysiology evaluation.  History of hypertension, PAF, acute on chronic diastolic heart failure, and history of GI bleeding.  He is currently anticoagulated and has had no additional bleeding.  He has been on dofetilide for approximately 1 year and had multiple episodes of atrial fibrillation in May.  He has had none since that time.  He is being referred for discussion of ablation.  His symptoms of atrial fibrillation or mild chest discomfort and shortness of breath.   Today, he denies symptoms of palpitations, chest pain, shortness of breath, orthopnea, PND, lower extremity edema, claudication, dizziness, presyncope, syncope, bleeding, or neurologic sequela. The patient is tolerating medications without difficulties.    Past Medical History:  Diagnosis Date  . Arthritis   . Atrial fibrillation, persistent ([place])   . Chronic sinusitis   . Coronary artery disease   . Depression   . Difficult intubation    was told with shoulder done 2006-alittle narrow  . 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 Hypertension   . Jejunostomy tube fell out    when asked about this in 04/2017, pt denied ever having a J tube, feeding tube, tubes placed post surgery so ??? veracity of a previous J tube.    . 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 Spastic colon   . Stroke (cerebrum) ([place])   . SVT (supraventricular tachycardia) (H[facility])    Past Surgical History:  Procedure Laterality Date  . COLONOSCOPY WITH PROPOFOL N/A 04/17/2017   Procedure: COLONOSCOPY WITH PROPOFOL;  Surgeon:  Yetta Flock, MD;  Location: [place];  Service: Gastroenterology;  Laterality: N/A;  . ESOPHAGEAL DILATION  2017  . ESOPHAGOGASTRODUODENOSCOPY (EGD) WITH PROPOFOL N/A 04/17/2017   Procedure: ESOPHAGOGASTRODUODENOSCOPY (EGD) WITH PROPOFOL;  Surgeon: Yetta Flock, MD;  Location: [place];  Service: Gastroenterology;  Laterality: N/A;  . FOOT NEUROMA SURGERY  2002  . LEFT HEART CATHETERIZATION WITH CORONARY ANGIOGRAM Right 06/14/2011   20% LM, chronic occluded mid LAD, 50% ostial LCX, 20% mid RI, RCA with collaterals to mid LAD, mid 10% stenosis, EF 60% 06/14/11  . NASAL SINUS SURGERY    . SHOULDER ARTHROSCOPY  03/01/2011   Procedure: ARTHROSCOPY SHOULDER;  Surgeon: Ninetta Lights, MD;  Location: [place];  Service: Orthopedics;  Laterality: Right;  arthroscopy shoulder decompression subacromial partial acromioplasty with coracoacromial release, distal claviculectomy, debridement of labrium  . SHOULDER ARTHROSCOPY  03/01/2011   Procedure: ARTHROSCOPY SHOULDER;  Surgeon: Ninetta Lights, MD;  Location: [place];  Service: Orthopedics;  Laterality: Right;  arthroscopy shoulder decompression subacromial partial acromioplasty with coracoacromial release, distal claviculectomy, debridement of labrium  . TEE WITHOUT CARDIOVERSION N/A 03/19/2016   Procedure: TRANSESOPHAGEAL ECHOCARDIOGRAM (TEE);  Surgeon: Larey Dresser, MD;  Location: [place];  Service: Cardiovascular;  Laterality: N/A;  . TOTAL KNEE ARTHROPLASTY Right 09/23/2013   Procedure: TOTAL KNEE ARTHROPLASTY;  Surgeon: Ninetta Lights, MD;  Location: [place];  Service: Orthopedics;  Laterality: Right;  . TOTAL SHOULDER ARTHROPLASTY  06/28/2011   Procedure: TOTAL SHOULDER ARTHROPLASTY;  Surgeon: Ninetta Lights, MD;  Location: [place];  Service: Orthopedics;  Laterality: Right;  . UVULOPALATOPHARYNGOPLASTY       Current Outpatient Medications  Medication Sig Dispense Refill   . acetaminophen (TYLENOL) 325 MG tablet Take 650 mg by mouth every 6 (six) hours as needed for moderate pain.    Marland Kitchen amLODipine (NORVASC) 5 MG tablet TAKE 1 TABLET BY MOUTH  DAILY 90 tablet 3  . Cholecalciferol (VITAMIN D3) 5000 units TABS Take 5,000 Units by mouth daily.    . Cyanocobalamin (VITAMIN B-12) 500 MCG SUBL Place 500 mcg under the tongue 3 (three) times a week.     . dofetilide (TIKOSYN) 250 MCG capsule TAKE 1 CAPSULE BY MOUTH TWO TIMES DAILY 180 capsule 3  . FEROSUL 325 (65 Fe) MG tablet TAKE 1 TABLET(325 MG) BY MOUTH TWICE DAILY 180 tablet 1  . furosemide (LASIX) 40 MG tablet Take 1 tablet (40 mg total) by mouth daily. 90 tablet 3  . irbesartan (AVAPRO) 150 MG tablet TAKE 1 TABLET(150 MG) BY MOUTH DAILY 90 tablet 3  . LORazepam (ATIVAN) 0.5 MG tablet Take 1 mg by mouth every 8 (eight) hours.    . magnesium oxide (MAG-OX) 400 MG tablet Take 400 mg by mouth daily.    . Melatonin 10 MG TABS Take 10 mg by mouth at bedtime as needed (sleep).     . metoprolol tartrate (LOPRESSOR) 25 MG tablet TAKE 1 TABLET BY MOUTH TWO  TIMES DAILY 180 tablet 3  . Multiple Vitamins-Minerals (PRESERVISION AREDS 2 PO) Take 1 capsule by mouth 2 (two) times daily.    . pantoprazole (PROTONIX) 40 MG tablet TAKE 1 TABLET(40 MG) BY MOUTH DAILY    . PARoxetine (PAXIL) 30 MG tablet Take 40 mg by mouth at bedtime.     . polycarbophil (FIBERCON) 625 MG tablet Take 1,250 mg by mouth at bedtime.     [name] Glycol-Propyl Glycol (SYSTANE OP) Place 2 drops into both eyes 2 (two) times daily as needed (dry eyes).     . potassium chloride (K-DUR) 10 MEQ tablet Take 1 tablet (10 mEq total) by mouth daily. 90 tablet 3  . rosuvastatin (CRESTOR) 5 MG tablet Take 1 tablet (5 mg total) by mouth every morning. 90 tablet 3  . tamsulosin (FLOMAX) 0.4 MG CAPS capsule Take 0.4 mg by mouth daily.  11  . traZODone (DESYREL) 50 MG tablet TAKE 1/2 TO 1 TABLET BY MOUTH AS NEEDED FOR SLEEP    . XARELTO 15 MG TABS tablet TAKE 1 TABLET  BY MOUTH EVERY DAY WITH SUPPER 30 tablet 0   No current facility-administered medications for this visit.     Allergies:   Glucosamine-chondroitin; Tetanus toxoid; Fish oil; Ibuprofen; and Naproxen   Social History:  The patient  reports that he quit smoking about 19 years ago. His smoking use included cigarettes. He has a 60.00 pack-year smoking history. He has never used smokeless tobacco. He reports that he drinks about 8.4 oz of alcohol per week. He reports that he does not use drugs.   Family History:  The patient's family history includes Cancer in his mother; Heart attack in his father; Migraines in his brother.    ROS:  Please see the history of present illness.   Otherwise, review of systems is positive for palpitations, SOB, balance problems, easy bruising.   All other systems are reviewed and negative.    PHYSICAL EXAM: VS:  BP 102/70   Pulse 81  Ht 5\' 9"  (1.753 m)   Wt 214 lb 9.6 oz (97.3 kg)   SpO2 95%   BMI 31.69 kg/m  , BMI Body mass index is 31.69 kg/m. GEN: Well nourished, well developed, in no acute distress  HEENT: normal  Neck: no JVD, carotid bruits, or masses Cardiac: RRR; no murmurs, rubs, or gallops,no edema  Respiratory:  clear to auscultation bilaterally, normal work of breathing GI: soft, nontender, nondistended, + BS MS: no deformity or atrophy  Skin: warm and dry Neuro:  Strength and sensation are intact Psych: euthymic mood, full affect  EKG:  EKG is not ordered today. Personal review of the ekg ordered 09/11/17 shows SR, rate 73  Recent Labs: 01/07/2017: Magnesium 2.1 04/15/2017: ALT 9; Pro B Natriuretic peptide (BNP) 117.0; TSH 1.43 08/28/2017: BUN 15; Creatinine, Ser 1.03; Hemoglobin 14.1; Platelets 223; Potassium 4.3; Sodium 138    Lipid Panel  No results found for: CHOL, TRIG, HDL, CHOLHDL, VLDL, LDLCALC, LDLDIRECT   Wt Readings from Last 3 Encounters:  10/10/17 214 lb 9.6 oz (97.3 kg)  09/11/17 216 lb (98 kg)  08/28/17 216 lb (98 kg)        Other studies Reviewed: Additional studies/ records that were reviewed today include: TTE 04/17/17  Review of the above records today demonstrates:  - Left ventricle: The cavity size was normal. Wall thickness was   increased in a pattern of moderate LVH. Systolic function was   normal. The estimated ejection fraction was in the range of 60%   to 65%. Wall motion was normal; there were no regional wall   motion abnormalities. Features are consistent with a pseudonormal   left ventricular filling pattern, with concomitant abnormal   relaxation and increased filling pressure (grade 2 diastolic   dysfunction). - Mitral valve: There was mild regurgitation. - Right ventricle: The cavity size was mildly dilated. Systolic   function was mildly reduced. - Tricuspid valve: There was mild-moderate regurgitation. - Pulmonary arteries: Systolic pressure was moderately increased.   PA peak pressure: 43 mm Hg (S).    ASSESSMENT AND PLAN:  1.  Paroxysmal atrial fibrillation: Has had increasing episodes of atrial fibrillation.  Currently anticoagulated with Xarelto.  I discussed further options with him for therapy for his atrial fibrillation including ablation versus medications.  Risks and benefits of ablation were discussed and include bleeding, tamponade, heart block, stroke, damage to surrounding organs.  Understands these risks and would like to see if he has more atrial fibrillation while on his dofetilide.  I Shykeem Resurreccion see him back if he has further issues.  This patients CHA2DS2-VASc Score and unadjusted Ischemic Stroke Rate (% per year) is equal to 9.7 % stroke rate/year from a score of 6  Above score calculated as 1 point each if present [CHF, HTN, DM, Vascular=MI/PAD/Aortic Plaque, Age if 65-74, or Male] Above score calculated as 2 points each if present [Age > 75, or Stroke/TIA/TE]  2.  Hyperlipidemia: Continue statin  3.  GI bleeding: Has not had any more recently.  We Merwin Breden  continue watchful waiting on Xarelto.  4.  Diastolic heart failure: Maintaining a low-salt diet.  No signs of volume overload.    Current medicines are reviewed at length with the patient today.   The patient does not have concerns regarding his medicines.  The following changes were made today:  none  Labs/ tests ordered today include:  No orders of the defined types were placed in this encounter.  Case discussed with primary cardiology  Disposition:   FU with Ebany Bowermaster PRN months  Signed, Nabeeha Badertscher Meredith Leeds, MD  10/10/2017 10:36 AM     [facility] [address] [place] [address] [phone] (office) [phone] (fax)

## 2017-10-10 ENCOUNTER — Encounter: Payer: Self-pay | Admitting: Cardiology

## 2017-10-10 ENCOUNTER — Ambulatory Visit (INDEPENDENT_AMBULATORY_CARE_PROVIDER_SITE_OTHER): Payer: Medicare Other | Admitting: Cardiology

## 2017-10-10 VITALS — BP 102/70 | HR 81 | Ht 69.0 in | Wt 214.6 lb

## 2017-10-10 DIAGNOSIS — I480 Paroxysmal atrial fibrillation: Secondary | ICD-10-CM | POA: Diagnosis not present

## 2017-10-10 DIAGNOSIS — I2510 Atherosclerotic heart disease of native coronary artery without angina pectoris: Secondary | ICD-10-CM | POA: Diagnosis not present

## 2017-10-10 DIAGNOSIS — E785 Hyperlipidemia, unspecified: Secondary | ICD-10-CM | POA: Diagnosis not present

## 2017-10-10 DIAGNOSIS — I5032 Chronic diastolic (congestive) heart failure: Secondary | ICD-10-CM

## 2017-10-10 NOTE — Patient Instructions (Addendum)
Medication Instructions:  Your physician recommends that you continue on your current medications as directed. Please refer to the Current Medication list given to you today.  * If you need a refill on your cardiac medications before your next appointment, please call your pharmacy.   Labwork: None ordered  Testing/Procedures: None ordered  Follow-Up: Keep your follow up with Dr. Lovena Le as scheduled.  Dr. Curt Bears will see you as needed going forward.  *Please note that any paperwork needing to be filled out by the provider will need to be addressed at the front desk prior to seeing the provider. Please note that any FMLA, disability or other documents regarding health condition is subject to a $25.00 charge that must be received prior to completion of paperwork in the form of a money order or check.  Thank you for choosing [facility]!!   Trinidad Curet, RN [phone]  Any Other Special Instructions Will Be Listed Below (If Applicable). Please call the office if you decide you would like to proceed with ablation.   Cardiac Ablation Cardiac ablation is a procedure to disable (ablate) a small amount of heart tissue in very specific places. The heart has many electrical connections. Sometimes these connections are abnormal and can cause the heart to beat very fast or irregularly. Ablating some of the problem areas can improve the heart rhythm or return it to normal. Ablation may be done for people who:  Have Wolff-Parkinson-White syndrome.  Have fast heart rhythms (tachycardia).  Have taken medicines for an abnormal heart rhythm (arrhythmia) that were not effective or caused side effects.  Have a high-risk heartbeat that may be life-threatening.  During the procedure, a small incision is made in the neck or the groin, and a long, thin, flexible tube (catheter) is inserted into the incision and moved to the heart. Small devices (electrodes) on the tip of the catheter will send  out electrical currents. A type of X-ray (fluoroscopy) will be used to help guide the catheter and to provide images of the heart. Tell a health care provider about:  Any allergies you have.  All medicines you are taking, including vitamins, herbs, eye drops, creams, and over-the-counter medicines.  Any problems you or family members have had with anesthetic medicines.  Any blood disorders you have.  Any surgeries you have had.  Any medical conditions you have, such as kidney failure.  Whether you are pregnant or may be pregnant. What are the risks? Generally, this is a safe procedure. However, problems may occur, including:  Infection.  Bruising and bleeding at the catheter insertion site.  Bleeding into the chest, especially into the sac that surrounds the heart. This is a serious complication.  Stroke or blood clots.  Damage to other structures or organs.  Allergic reaction to medicines or dyes.  Need for a permanent pacemaker if the normal electrical system is damaged. A pacemaker is a small computer that sends electrical signals to the heart and helps your heart beat normally.  The procedure not being fully effective. This may not be recognized until months later. Repeat ablation procedures are sometimes required.  What happens before the procedure?  Follow instructions from your health care provider about eating or drinking restrictions.  Ask your health care provider about: ? Changing or stopping your regular medicines. This is especially important if you are taking diabetes medicines or blood thinners. ? Taking medicines such as aspirin and ibuprofen. These medicines can thin your blood. Do not take these medicines before  your procedure if your health care provider instructs you not to.  Plan to have someone take you home from the hospital or clinic.  If you will be going home right after the procedure, plan to have someone with you for 24 hours. What happens  during the procedure?  To lower your risk of infection: ? Your health care team will wash or sanitize their hands. ? Your skin will be washed with soap. ? Hair may be removed from the incision area.  An IV tube will be inserted into one of your veins.  You will be given a medicine to help you relax (sedative).  The skin on your neck or groin will be numbed.  An incision will be made in your neck or your groin.  A needle will be inserted through the incision and into a large vein in your neck or groin.  A catheter will be inserted into the needle and moved to your heart.  Dye may be injected through the catheter to help your surgeon see the area of the heart that needs treatment.  Electrical currents will be sent from the catheter to ablate heart tissue in desired areas. There are three types of energy that may be used to ablate heart tissue: ? Heat (radiofrequency energy). ? Laser energy. ? Extreme cold (cryoablation).  When the necessary tissue has been ablated, the catheter will be removed.  Pressure will be held on the catheter insertion area to prevent excessive bleeding.  A bandage (dressing) will be placed over the catheter insertion area. The procedure may vary among health care providers and hospitals. What happens after the procedure?  Your blood pressure, heart rate, breathing rate, and blood oxygen level will be monitored until the medicines you were given have worn off.  Your catheter insertion area will be monitored for bleeding. You will need to lie still for a few hours to ensure that you do not bleed from the catheter insertion area.  Do not drive for 24 hours or as long as directed by your health care provider. Summary  Cardiac ablation is a procedure to disable (ablate) a small amount of heart tissue in very specific places. Ablating some of the problem areas can improve the heart rhythm or return it to normal.  During the procedure, electrical currents  will be sent from the catheter to ablate heart tissue in desired areas. This information is not intended to replace advice given to you by your health care provider. Make sure you discuss any questions you have with your health care provider. Document Released: 07/15/2008 Document Revised: 01/16/2016 Document Reviewed: 01/16/2016 Elsevier Interactive Patient Education  [employer].

## 2017-10-15 ENCOUNTER — Encounter (INDEPENDENT_AMBULATORY_CARE_PROVIDER_SITE_OTHER): Payer: Medicare Other | Admitting: Ophthalmology

## 2017-10-15 DIAGNOSIS — D3131 Benign neoplasm of right choroid: Secondary | ICD-10-CM

## 2017-10-15 DIAGNOSIS — H353122 Nonexudative age-related macular degeneration, left eye, intermediate dry stage: Secondary | ICD-10-CM | POA: Diagnosis not present

## 2017-10-15 DIAGNOSIS — H318 Other specified disorders of choroid: Secondary | ICD-10-CM | POA: Diagnosis not present

## 2017-10-15 DIAGNOSIS — H35033 Hypertensive retinopathy, bilateral: Secondary | ICD-10-CM | POA: Diagnosis not present

## 2017-10-15 DIAGNOSIS — I10 Essential (primary) hypertension: Secondary | ICD-10-CM | POA: Diagnosis not present

## 2017-10-15 DIAGNOSIS — H43813 Vitreous degeneration, bilateral: Secondary | ICD-10-CM

## 2017-10-19 ENCOUNTER — Other Ambulatory Visit: Payer: Self-pay | Admitting: Oncology

## 2017-10-30 ENCOUNTER — Telehealth: Payer: Self-pay | Admitting: *Deleted

## 2017-10-30 NOTE — Telephone Encounter (Addendum)
Call from pt stating he had not received Xarelto refill . Rx was refilled on 8/14 by Dr. Beryle Beams. Informed pt I will call [place].  I called [facility] pharmacy - stated they had not rec rx; verbal order given to the pharmacist.

## 2017-11-07 ENCOUNTER — Other Ambulatory Visit: Payer: Self-pay | Admitting: Physician Assistant

## 2017-11-13 ENCOUNTER — Other Ambulatory Visit: Payer: Self-pay | Admitting: *Deleted

## 2017-11-13 MED ORDER — RIVAROXABAN 15 MG PO TABS: ORAL_TABLET | ORAL | 0 refills | Status: DC

## 2017-11-13 NOTE — Telephone Encounter (Signed)
Xarelto 15mg  refill request received; pt is 77 yrs old, Cr-1.03 on 08/28/17, Wt-97.3kg, last seen by Dr. Curt Bears on 10/10/17, CrCl-83.66ml/min-therefore pt is on the incorrect doseage per dosing criteria; per 04/18/17 discharge summary by Dr. Beryle Beams the pt is to stay on Xarelto 15mg  as Dr. Beryle Beams was consulted and recommended continuing Xarelto at 15 mg daily with history of GI bleeding and per OV notes from [facility] and Lovena Le pt to continue watchful waiting on Xarelto. Will send in refill to requested pharmacy.

## 2017-11-15 ENCOUNTER — Encounter (INDEPENDENT_AMBULATORY_CARE_PROVIDER_SITE_OTHER): Payer: Medicare Other | Admitting: Ophthalmology

## 2017-11-15 DIAGNOSIS — H353122 Nonexudative age-related macular degeneration, left eye, intermediate dry stage: Secondary | ICD-10-CM | POA: Diagnosis not present

## 2017-11-15 DIAGNOSIS — I10 Essential (primary) hypertension: Secondary | ICD-10-CM

## 2017-11-15 DIAGNOSIS — H353211 Exudative age-related macular degeneration, right eye, with active choroidal neovascularization: Secondary | ICD-10-CM

## 2017-11-15 DIAGNOSIS — H35033 Hypertensive retinopathy, bilateral: Secondary | ICD-10-CM

## 2017-11-15 DIAGNOSIS — H43813 Vitreous degeneration, bilateral: Secondary | ICD-10-CM

## 2017-11-20 ENCOUNTER — Other Ambulatory Visit: Payer: Self-pay | Admitting: Internal Medicine

## 2017-11-20 NOTE — Telephone Encounter (Signed)
Refill request was sent in for a 3 month supply on 11/13/17; since pt has a history of GI bleeding Dr. Curt Bears & Lovena Le want the pt watched closely.   Spoke with [facility] Pharmacist and she stated they have the medication refill and added 1 refill; 90 days and 1 refill only. Pt was called and is aware that labs have to be followed closely so no more than that can be sent.

## 2017-11-20 NOTE — Telephone Encounter (Signed)
Pt calling requesting a refill for Xarelto , year supply sent to [facility] mail order pharmacy. Please address

## 2017-11-28 ENCOUNTER — Other Ambulatory Visit: Payer: Self-pay | Admitting: Internal Medicine

## 2017-12-13 ENCOUNTER — Encounter (INDEPENDENT_AMBULATORY_CARE_PROVIDER_SITE_OTHER): Payer: Medicare Other | Admitting: Ophthalmology

## 2017-12-13 DIAGNOSIS — H35033 Hypertensive retinopathy, bilateral: Secondary | ICD-10-CM

## 2017-12-13 DIAGNOSIS — H353211 Exudative age-related macular degeneration, right eye, with active choroidal neovascularization: Secondary | ICD-10-CM

## 2017-12-13 DIAGNOSIS — D3131 Benign neoplasm of right choroid: Secondary | ICD-10-CM

## 2017-12-13 DIAGNOSIS — H43813 Vitreous degeneration, bilateral: Secondary | ICD-10-CM

## 2017-12-13 DIAGNOSIS — H353122 Nonexudative age-related macular degeneration, left eye, intermediate dry stage: Secondary | ICD-10-CM

## 2017-12-13 DIAGNOSIS — I10 Essential (primary) hypertension: Secondary | ICD-10-CM

## 2017-12-24 ENCOUNTER — Other Ambulatory Visit: Payer: Self-pay | Admitting: Internal Medicine

## 2018-01-08 ENCOUNTER — Other Ambulatory Visit: Payer: Self-pay | Admitting: Internal Medicine

## 2018-01-08 ENCOUNTER — Other Ambulatory Visit: Payer: Self-pay | Admitting: Oncology

## 2018-01-09 ENCOUNTER — Other Ambulatory Visit: Payer: Self-pay | Admitting: Oncology

## 2018-01-10 ENCOUNTER — Encounter (INDEPENDENT_AMBULATORY_CARE_PROVIDER_SITE_OTHER): Payer: Medicare Other | Admitting: Ophthalmology

## 2018-01-10 DIAGNOSIS — D3131 Benign neoplasm of right choroid: Secondary | ICD-10-CM

## 2018-01-10 DIAGNOSIS — H35033 Hypertensive retinopathy, bilateral: Secondary | ICD-10-CM | POA: Diagnosis not present

## 2018-01-10 DIAGNOSIS — H43813 Vitreous degeneration, bilateral: Secondary | ICD-10-CM

## 2018-01-10 DIAGNOSIS — I10 Essential (primary) hypertension: Secondary | ICD-10-CM

## 2018-01-10 DIAGNOSIS — H353211 Exudative age-related macular degeneration, right eye, with active choroidal neovascularization: Secondary | ICD-10-CM

## 2018-01-10 DIAGNOSIS — H353122 Nonexudative age-related macular degeneration, left eye, intermediate dry stage: Secondary | ICD-10-CM

## 2018-01-29 ENCOUNTER — Other Ambulatory Visit (HOSPITAL_COMMUNITY): Payer: Self-pay | Admitting: Interventional Radiology

## 2018-01-29 DIAGNOSIS — I771 Stricture of artery: Secondary | ICD-10-CM

## 2018-02-11 ENCOUNTER — Ambulatory Visit (HOSPITAL_COMMUNITY)
Admission: RE | Admit: 2018-02-11 | Discharge: 2018-02-11 | Disposition: A | Discharge status: Home / Self Care | Payer: Medicare Other | Source: Ambulatory Visit | Attending: Interventional Radiology | Admitting: Interventional Radiology

## 2018-02-11 DIAGNOSIS — I771 Stricture of artery: Secondary | ICD-10-CM

## 2018-02-12 ENCOUNTER — Telehealth (HOSPITAL_COMMUNITY): Payer: Self-pay

## 2018-02-12 ENCOUNTER — Other Ambulatory Visit: Payer: Self-pay | Admitting: Internal Medicine

## 2018-02-12 DIAGNOSIS — R911 Solitary pulmonary nodule: Secondary | ICD-10-CM

## 2018-02-12 DIAGNOSIS — R918 Other nonspecific abnormal finding of lung field: Secondary | ICD-10-CM

## 2018-02-12 NOTE — Telephone Encounter (Signed)
Pt agreed to f/u in 6 months with us carotid. AW 

## 2018-02-14 ENCOUNTER — Encounter (INDEPENDENT_AMBULATORY_CARE_PROVIDER_SITE_OTHER): Payer: Medicare Other | Admitting: Ophthalmology

## 2018-02-14 DIAGNOSIS — H35033 Hypertensive retinopathy, bilateral: Secondary | ICD-10-CM

## 2018-02-14 DIAGNOSIS — H353122 Nonexudative age-related macular degeneration, left eye, intermediate dry stage: Secondary | ICD-10-CM | POA: Diagnosis not present

## 2018-02-14 DIAGNOSIS — H353211 Exudative age-related macular degeneration, right eye, with active choroidal neovascularization: Secondary | ICD-10-CM

## 2018-02-14 DIAGNOSIS — D3131 Benign neoplasm of right choroid: Secondary | ICD-10-CM

## 2018-02-14 DIAGNOSIS — H43813 Vitreous degeneration, bilateral: Secondary | ICD-10-CM

## 2018-02-14 DIAGNOSIS — I10 Essential (primary) hypertension: Secondary | ICD-10-CM

## 2018-03-21 ENCOUNTER — Encounter (INDEPENDENT_AMBULATORY_CARE_PROVIDER_SITE_OTHER): Payer: Medicare Other | Admitting: Ophthalmology

## 2018-03-21 DIAGNOSIS — H353122 Nonexudative age-related macular degeneration, left eye, intermediate dry stage: Secondary | ICD-10-CM

## 2018-03-21 DIAGNOSIS — H43813 Vitreous degeneration, bilateral: Secondary | ICD-10-CM

## 2018-03-21 DIAGNOSIS — H353211 Exudative age-related macular degeneration, right eye, with active choroidal neovascularization: Secondary | ICD-10-CM | POA: Diagnosis not present

## 2018-03-21 DIAGNOSIS — D3131 Benign neoplasm of right choroid: Secondary | ICD-10-CM

## 2018-03-21 DIAGNOSIS — H35033 Hypertensive retinopathy, bilateral: Secondary | ICD-10-CM | POA: Diagnosis not present

## 2018-03-21 DIAGNOSIS — I10 Essential (primary) hypertension: Secondary | ICD-10-CM

## 2018-03-24 ENCOUNTER — Other Ambulatory Visit: Payer: Self-pay | Admitting: Internal Medicine

## 2018-03-25 ENCOUNTER — Other Ambulatory Visit: Payer: Self-pay | Admitting: Cardiology

## 2018-03-25 NOTE — Telephone Encounter (Signed)
Xarelto 15mg  refill request received; pt is 78 yrs old, wt-97.3kg, Crea-1.03 on 08/28/17, last seen by Dr. Curt Bears on 10/10/17, Fletcher.11ml/min; therefore pt is on the incorrect doseage per dosing criteria; per 04/18/17 discharge summary by Dr. Beryle Beams the pt is to stay on Xarelto 15mg  as Dr. Beryle Beams was consulted and recommended continuing Xarelto at 15 mg daily with history of GI bleeding and per OV notes from [facility] and Lovena Le pt to continue watchful waiting on Xarelto. Will send in refill to requested pharmacy.

## 2018-04-08 ENCOUNTER — Encounter: Payer: Self-pay | Admitting: Internal Medicine

## 2018-04-08 ENCOUNTER — Ambulatory Visit (INDEPENDENT_AMBULATORY_CARE_PROVIDER_SITE_OTHER): Payer: Medicare Other | Admitting: Internal Medicine

## 2018-04-08 ENCOUNTER — Ambulatory Visit: Payer: Medicare Other | Admitting: Internal Medicine

## 2018-04-08 VITALS — BP 120/60 | HR 69 | Ht 69.0 in | Wt 214.4 lb

## 2018-04-08 DIAGNOSIS — I480 Paroxysmal atrial fibrillation: Secondary | ICD-10-CM | POA: Diagnosis not present

## 2018-04-08 DIAGNOSIS — I2510 Atherosclerotic heart disease of native coronary artery without angina pectoris: Secondary | ICD-10-CM | POA: Diagnosis not present

## 2018-04-08 NOTE — Progress Notes (Signed)
HPI Mr. Basher returns today for followup of his atrial fib. He is a pleasant 78 yo man with a h/o HTN, chronic diastolic heart failure, worse when he is out of rhythm, and remote GI bleeding. I referred him to consider atrial fib ablation. In the interim, he has been sob but not necessarily when he is out of rhythm. He describes getting sob when he walked in from the parking lot. No anginal symptoms. He is pending a CT scan of the chest tomorrow. Review of his PFT's demonstrates a moderate reduction in his DLCO.  Allergies  Allergen Reactions  . Glucosamine-Chondroitin Other (See Comments) and Anaphylaxis    Stomach cramps, can't eat Stomach cramps, can't eat  . Tetanus Toxoid Anaphylaxis, Hives and Other (See Comments)    REACTION: hives/throat swells shut REACTION: hives/throat swells shut REACTION: hives/throat swells shut  . Fish Oil Other (See Comments)    Stomach cramps, can't eat  Stomach cramps, can't eat Stomach cramps, can't eat  . Ibuprofen Nausea Only and Other (See Comments)    Stomach pains Stomach pains Stomach pains  . Naproxen Diarrhea     Current Outpatient Medications  Medication Sig Dispense Refill  . acetaminophen (TYLENOL) 325 MG tablet Take 650 mg by mouth every 6 (six) hours as needed for moderate pain.    Marland Kitchen amLODipine (NORVASC) 5 MG tablet TAKE 1 TABLET BY MOUTH  DAILY 90 tablet 3  . Cholecalciferol (VITAMIN D3) 5000 units TABS Take 5,000 Units by mouth daily.    . Cyanocobalamin (VITAMIN B-12) 500 MCG SUBL Place 500 mcg under the tongue 3 (three) times a week.     . dofetilide (TIKOSYN) 250 MCG capsule TAKE 1 CAPSULE BY MOUTH TWO TIMES DAILY 180 capsule 2  . FEROSUL 325 (65 Fe) MG tablet TAKE 1 TABLET BY MOUTH TWICE DAILY 180 tablet 1  . furosemide (LASIX) 40 MG tablet TAKE 1 TABLET(40 MG) BY MOUTH DAILY 90 tablet 2  . irbesartan (AVAPRO) 150 MG tablet TAKE 1 TABLET(150 MG) BY MOUTH DAILY 90 tablet 3  . magnesium oxide (MAG-OX) 400 MG tablet Take  400 mg by mouth daily.    . Melatonin 10 MG TABS Take 10 mg by mouth at bedtime as needed (sleep).     . metoprolol tartrate (LOPRESSOR) 25 MG tablet TAKE 1 TABLET BY MOUTH TWO  TIMES DAILY 180 tablet 3  . Multiple Vitamins-Minerals (PRESERVISION AREDS 2 PO) Take 1 capsule by mouth 2 (two) times daily.    . pantoprazole (PROTONIX) 40 MG tablet TAKE 1 TABLET(40 MG) BY MOUTH DAILY    . PARoxetine (PAXIL) 30 MG tablet Take 30 mg by mouth at bedtime.     . polycarbophil (FIBERCON) 625 MG tablet Take 1,250 mg by mouth at bedtime.     [name] Glycol-Propyl Glycol (SYSTANE OP) Place 2 drops into both eyes 2 (two) times daily as needed (dry eyes).     . potassium chloride (K-DUR) 10 MEQ tablet Take 1 tablet (10 mEq total) by mouth daily. 90 tablet 3  . rosuvastatin (CRESTOR) 5 MG tablet TAKE 1 TABLET BY MOUTH  EVERY MORNING 90 tablet 3  . tamsulosin (FLOMAX) 0.4 MG CAPS capsule Take 0.4 mg by mouth daily.  11  . XARELTO 15 MG TABS tablet TAKE 1 TABLET BY MOUTH  DAILY WITH SUPPER 90 tablet 1   No current facility-administered medications for this visit.      Past Medical History:  Diagnosis Date  . Arthritis   .  Atrial fibrillation, persistent ([place])   . Chronic sinusitis   . Coronary artery disease   . Depression   . Difficult intubation    was told with shoulder done 2006-alittle narrow  . 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 Hypertension   . Jejunostomy tube fell out    when asked about this in 04/2017, pt denied ever having a J tube, feeding tube, tubes placed post surgery so ??? veracity of a previous J tube.    . 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 Spastic colon   . Stroke (cerebrum) ([place])   . SVT (supraventricular tachycardia) ([facility])     ROS:   All systems reviewed and negative except as noted in the HPI.   Past Surgical History:  Procedure Laterality Date  . COLONOSCOPY WITH PROPOFOL N/A 04/17/2017   Procedure: COLONOSCOPY WITH PROPOFOL;  Surgeon:  Yetta Flock, MD;  Location: [place];  Service: Gastroenterology;  Laterality: N/A;  . ESOPHAGEAL DILATION  2017  . ESOPHAGOGASTRODUODENOSCOPY (EGD) WITH PROPOFOL N/A 04/17/2017   Procedure: ESOPHAGOGASTRODUODENOSCOPY (EGD) WITH PROPOFOL;  Surgeon: Yetta Flock, MD;  Location: [place];  Service: Gastroenterology;  Laterality: N/A;  . FOOT NEUROMA SURGERY  2002  . LEFT HEART CATHETERIZATION WITH CORONARY ANGIOGRAM Right 06/14/2011   20% LM, chronic occluded mid LAD, 50% ostial LCX, 20% mid RI, RCA with collaterals to mid LAD, mid 10% stenosis, EF 60% 06/14/11  . NASAL SINUS SURGERY    . SHOULDER ARTHROSCOPY  03/01/2011   Procedure: ARTHROSCOPY SHOULDER;  Surgeon: Ninetta Lights, MD;  Location: [place];  Service: Orthopedics;  Laterality: Right;  arthroscopy shoulder decompression subacromial partial acromioplasty with coracoacromial release, distal claviculectomy, debridement of labrium  . SHOULDER ARTHROSCOPY  03/01/2011   Procedure: ARTHROSCOPY SHOULDER;  Surgeon: Ninetta Lights, MD;  Location: [place];  Service: Orthopedics;  Laterality: Right;  arthroscopy shoulder decompression subacromial partial acromioplasty with coracoacromial release, distal claviculectomy, debridement of labrium  . TEE WITHOUT CARDIOVERSION N/A 03/19/2016   Procedure: TRANSESOPHAGEAL ECHOCARDIOGRAM (TEE);  Surgeon: Larey Dresser, MD;  Location: [place];  Service: Cardiovascular;  Laterality: N/A;  . TOTAL KNEE ARTHROPLASTY Right 09/23/2013   Procedure: TOTAL KNEE ARTHROPLASTY;  Surgeon: Ninetta Lights, MD;  Location: [place];  Service: Orthopedics;  Laterality: Right;  . TOTAL SHOULDER ARTHROPLASTY  06/28/2011   Procedure: TOTAL SHOULDER ARTHROPLASTY;  Surgeon: Ninetta Lights, MD;  Location: [place];  Service: Orthopedics;  Laterality: Right;  . UVULOPALATOPHARYNGOPLASTY       Family History  Problem Relation Age of Onset  . Cancer  Mother        colon  . Heart attack Father   . Migraines Brother      Social History   Socioeconomic History  . Marital status: Married    Spouse name: Not on file  . Number of children: 3  . Years of education: Not on file  . Highest education level: Not on file  Occupational History  . Occupation: retired  Scientific laboratory technician  . Financial resource strain: Not on file  . Food insecurity:    Worry: Not on file    Inability: Not on file  . Transportation needs:    Medical: Not on file    Non-medical: Not on file  Tobacco Use  . Smoking status: Former Smoker    Packs/day: 2.00    Years: 30.00    Pack years: 60.00    Types:  Cigarettes    Last attempt to quit: 02/27/1998    Years since quitting: 20.1  . Smokeless tobacco: Never Used  Substance and Sexual Activity  . Alcohol use: Yes    Alcohol/week: 14.0 standard drinks    Types: 14 Standard drinks or equivalent per week    Comment: daily  . Drug use: No  . Sexual activity: Not on file  Lifestyle  . Physical activity:    Days per week: Not on file    Minutes per session: Not on file  . Stress: Not on file  Relationships  . Social connections:    Talks on phone: Not on file    Gets together: Not on file    Attends religious service: Not on file    Active member of club or organization: Not on file    Attends meetings of clubs or organizations: Not on file    Relationship status: Not on file  . Intimate partner violence:    Fear of current or ex partner: Not on file    Emotionally abused: Not on file    Physically abused: Not on file    Forced sexual activity: Not on file  Other Topics Concern  . Not on file  Social History Narrative   Patient is left handed.   Patient drinks one cup caffeine daily.     Ht 5\' 9"  (1.753 m)   BMI 31.69 kg/m   Physical Exam:  Well appearing 79 yo man, NAD HEENT: Unremarkable Neck:  No JVD, no thyromegally Lymphatics:  No adenopathy Back:  No CVA tenderness Lungs:  Clear  with reduced breath sounds but no wheezes HEART:  Regular rate rhythm, no murmurs, no rubs, no clicks Abd:  soft, positive bowel sounds, no organomegally, no rebound, no guarding Ext:  2 plus pulses, no edema, no cyanosis, no clubbing Skin:  No rashes no nodules Neuro:  CN II through XII intact, motor grossly intact  EKG - nsr  Assess/Plan: 1. Dyspnea - I suspect that this is multifactorial but he was noted a year ago to have a moderate diffusion defect. He has a long h/o smoking but stopped 20 years ago. We discussed a CPX test but will hold for now. 2. PAF - he is in NSR today. I discussed atrial fib ablation but at this point he will hold off on the CT scan.  3. Chronic diastolic heart failure - I suspect that this is also playing a role in his dyspnea. He eats more salt than he should. I discussed increasing the lasix but will hold off on this for now.  4. GI bleeding - he has had no additional and we will continue his xarelto for now.  Mikle Bosworth.D.

## 2018-04-08 NOTE — Patient Instructions (Addendum)
Medication Instructions:  Your physician recommends that you continue on your current medications as directed. Please refer to the Current Medication list given to you today.  Labwork: None ordered.  Testing/Procedures: Your physician has recommended that you have a cardiopulmonary stress test (CPX). CPX testing is a non-invasive measurement of heart and lung function. It replaces a traditional treadmill stress test. This type of test provides a tremendous amount of information that relates not only to your present condition but also for future outcomes. This test combines measurements of you ventilation, respiratory gas exchange in the lungs, electrocardiogram (EKG), blood pressure and physical response before, during, and following an exercise protocol.  Possible CPX test to be determined by results of cardiac CT  Follow-Up: Your physician wants you to follow-up in:  6 months with Dr. Lovena Le.  You will receive a recall letter.   Any Other Special Instructions Will Be Listed Below (If Applicable).  If you need a refill on your cardiac medications before your next appointment, please call your pharmacy.

## 2018-04-09 ENCOUNTER — Ambulatory Visit (INDEPENDENT_AMBULATORY_CARE_PROVIDER_SITE_OTHER): Payer: Self-pay

## 2018-04-09 ENCOUNTER — Ambulatory Visit
Admission: RE | Admit: 2018-04-09 | Discharge: 2018-04-09 | Disposition: A | Discharge status: Home / Self Care | Payer: Medicare Other | Source: Ambulatory Visit | Attending: Internal Medicine | Admitting: Internal Medicine

## 2018-04-09 ENCOUNTER — Ambulatory Visit (INDEPENDENT_AMBULATORY_CARE_PROVIDER_SITE_OTHER): Payer: Medicare Other | Admitting: Orthopedic Surgery

## 2018-04-09 ENCOUNTER — Encounter (INDEPENDENT_AMBULATORY_CARE_PROVIDER_SITE_OTHER): Payer: Self-pay | Admitting: Orthopedic Surgery

## 2018-04-09 ENCOUNTER — Ambulatory Visit (INDEPENDENT_AMBULATORY_CARE_PROVIDER_SITE_OTHER): Payer: Medicare Other

## 2018-04-09 ENCOUNTER — Telehealth: Payer: Self-pay | Admitting: Internal Medicine

## 2018-04-09 DIAGNOSIS — R911 Solitary pulmonary nodule: Secondary | ICD-10-CM

## 2018-04-09 DIAGNOSIS — M1711 Unilateral primary osteoarthritis, right knee: Secondary | ICD-10-CM

## 2018-04-09 DIAGNOSIS — I2510 Atherosclerotic heart disease of native coronary artery without angina pectoris: Secondary | ICD-10-CM | POA: Diagnosis not present

## 2018-04-09 DIAGNOSIS — M1712 Unilateral primary osteoarthritis, left knee: Secondary | ICD-10-CM

## 2018-04-09 DIAGNOSIS — R918 Other nonspecific abnormal finding of lung field: Secondary | ICD-10-CM

## 2018-04-09 NOTE — Progress Notes (Signed)
LMTCB

## 2018-04-09 NOTE — Telephone Encounter (Signed)
I called pt back but there was no answer. I was unable to leave voicemail.  Notes recorded by Tanda Rockers, MD on 04/09/2018 at 2:00 PM EST Call patient : Study is overall better x for one new finding of concern in the RLL for which radiology recs a PET scan to be complete and I agree - happy to do ov first if he prefers or just go ahead and schedule an I will call with results

## 2018-04-09 NOTE — Telephone Encounter (Signed)
Called and spoke with Patients Wife.  Left message for Patient to call back when available.

## 2018-04-09 NOTE — Telephone Encounter (Signed)
Patient returned call, CB is [phone]

## 2018-04-10 NOTE — Telephone Encounter (Signed)
Spoke with patient about results. Advised of MW's recommendations. Patient stated he wanted to proceed with the PET scan. Scan ordered. Patient voiced understanding Nothing further needed at this time.

## 2018-04-13 DIAGNOSIS — M1712 Unilateral primary osteoarthritis, left knee: Secondary | ICD-10-CM | POA: Diagnosis not present

## 2018-04-13 MED ORDER — BUPIVACAINE HCL 0.25 % IJ SOLN
4.0000 mL | INTRAMUSCULAR | Status: AC | PRN
  Administered 2018-04-13: 4 mL via INTRA_ARTICULAR

## 2018-04-13 MED ORDER — METHYLPREDNISOLONE ACETATE 40 MG/ML IJ SUSP
40.0000 mg | INTRAMUSCULAR | Status: AC | PRN
  Administered 2018-04-13: 40 mg via INTRA_ARTICULAR

## 2018-04-13 MED ORDER — LIDOCAINE HCL 1 % IJ SOLN
5.0000 mL | INTRAMUSCULAR | Status: AC | PRN
  Administered 2018-04-13: 5 mL

## 2018-04-13 NOTE — Progress Notes (Signed)
Office Visit Note   Patient: George Vega           Date of Birth: Mar 03, 1941           MRN: [id number] Visit Date: 04/09/2018 Requested by: George Kid, MD [place],  [id number] PCP: George Kid, MD  Subjective: Chief Complaint  Patient presents with  . Right Knee - Pain  . Left Knee - Pain    HPI: George Vega is a patient with bilateral knee pain.  Patient had right total knee replacement 09/23/2013.  Had a bone scan performed 07/15/2017 for pain.  Never went over that scan.  Scan looks pretty reasonable with no definite actionable results.  He also is having some left knee pain.  Radiographs done at an outside location also reviewed and show mild medial compartment arthritis in that left knee.  He does report pain in both knees it wakes him up at night.  Denies any fevers or chills.  Reports pain primarily on the right-hand side at that patella.  He can only walk about 100 feet.  Denies any back pain or radicular symptoms.  He does use braces.  Had left knee arthroscopy about 10 years ago.              ROS: All systems reviewed are negative as they relate to the chief complaint within the history of present illness.  Patient denies  fevers or chills.   Assessment & Plan: Visit Diagnoses:  1. Unilateral primary osteoarthritis, left knee   2. Unilateral primary osteoarthritis, right knee     Plan: Impression is mildly painful right total knee replacement with good position and alignment no evidence of infection or loosening.  I think he has what he has any skin have to make do with it.  No intervention required on that right knee other than generalized quad strengthening in a nonweightbearing fashion.  On the left knee he has mild medial compartment arthritis.  Does not really want to undergo knee replacement again.  We will try an injection in that left knee of cortisone and then try to preapproved him for gel injection when a cortisone shot wears off.  I will see him  back as needed.  Follow-Up Instructions: Return if symptoms worsen or fail to improve.   Orders:  Orders Placed This Encounter  Procedures  . XR KNEE 3 VIEW RIGHT  . XR KNEE 3 VIEW LEFT   No orders of the defined types were placed in this encounter.     Procedures: Large Joint Inj: L knee on 04/13/2018 8:57 PM Indications: diagnostic evaluation, joint swelling and pain Details: 18 G 1.5 in needle, superolateral approach  Arthrogram: No  Medications: 5 mL lidocaine 1 %; 40 mg methylPREDNISolone acetate 40 MG/ML; 4 mL bupivacaine 0.25 % Outcome: tolerated well, no immediate complications Procedure, treatment alternatives, risks and benefits explained, specific risks discussed. Consent was given by the patient. Immediately prior to procedure a time out was called to verify the correct patient, procedure, equipment, support staff and site/side marked as required. Patient was prepped and draped in the usual sterile fashion.       Clinical Data: No additional findings.  Objective: Vital Signs: There were no vitals taken for this visit.  Physical Exam:   Constitutional: Patient appears well-developed HEENT:  Head: Normocephalic Eyes:EOM are normal Neck: Normal range of motion Cardiovascular: Normal rate Pulmonary/chest: Effort normal Neurologic: Patient is alert Skin: Skin is warm Psychiatric: Patient has normal mood  and affect    Ortho Exam: Ortho exam demonstrates normal gait and alignment.  Pedal pulses palpable.  Some venous varicosities are present bilaterally.  On the right knee he has good extension and flexion past 90 with no effusion no warmth.  No groin pain with internal X rotation of that right leg and no nerve root tension signs.  On the left the patient has no effusion and medial joint line tenderness.  Mild patellofemoral crepitus is present but collateral cruciate ligaments are stable at 0 and 30 degrees.  Pedal pulses palpable bilaterally.  No other masses  lymphadenopathy or skin changes noted in that left knee region  Specialty Comments:  No specialty comments available.  Imaging: No results found.   PMFS History: Patient Active Problem List   Diagnosis Date Noted  . AVM (arteriovenous malformation) of colon without hemorrhage   . Gastric AVM   . Chronic anticoagulation   . Iron deficiency anemia due to chronic blood loss   . Symptomatic anemia 04/16/2017  . 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 Prediabetes 01/30/2017  . Vitamin D deficiency 01/30/2017  . Multiple pulmonary nodules determined by computed tomography of lung 10/09/2016  . Pulmonary nodules 10/09/2016  . Persistent atrial fibrillation 03/19/2016  . Upper airway cough syndrome 01/21/2016  . Dyspnea on exertion 01/20/2016  . SVT (supraventricular tachycardia) ([facility]) 11/01/2015  . Atrial tachycardia ([facility]) 09/08/2015  . 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 BPH (benign prostatic hyperplasia) 05/19/2014  . CVA (cerebral vascular accident) ([facility]) 02/01/2014  . Benign paroxysmal positional vertigo 01/12/2014  . Headache 01/12/2014  . DTs (delirium tremens) ([facility]) 09/26/2013  . 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 Ataxia 04/02/2013  . Leukocytosis 04/02/2013  . CAD (coronary artery disease) 04/02/2013  . Hyperlipemia 04/02/2013  . HTN (hypertension) 04/02/2013  . Abnormal MRA, brain 11/06/2012  . Arthralgia 09/02/2012  . Synovitis of wrist 07/25/2012  . OA (osteoarthritis) 07/25/2012  . Spastic colon 05/03/2011  . Arthritis 05/03/2011  . Coronary atherosclerosis 11/04/2008  . DEPRESSION 11/03/2008  . DYSTHYMIC DISORDER 10/13/2008  . STRESS ELECTROCARDIOGRAM, ABNORMAL 10/13/2008   Past Medical History:  Diagnosis Date  . Arthritis   . Atrial fibrillation, persistent   . Chronic sinusitis   . Coronary artery disease   . Depression   .  Difficult intubation    was told with shoulder done 2006-alittle narrow  . 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 Hypertension   . Jejunostomy tube fell out    when asked about this in 04/2017, pt denied ever having a J tube, feeding tube, tubes placed post surgery so ??? veracity of a previous J tube.    . 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 Spastic colon   . Stroke (cerebrum) ([facility])   . SVT (supraventricular tachycardia) ([facility])     Family History  Problem Relation Age of Onset  . Cancer Mother        colon  . Heart attack Father   . Migraines Brother     Past Surgical History:  Procedure Laterality Date  . COLONOSCOPY WITH PROPOFOL N/A 04/17/2017   Procedure: COLONOSCOPY WITH PROPOFOL;  Surgeon: Yetta Flock, MD;  Location: [facility];  Service: Gastroenterology;  Laterality: N/A;  . ESOPHAGEAL DILATION  2017  . ESOPHAGOGASTRODUODENOSCOPY (EGD) WITH PROPOFOL N/A 04/17/2017   Procedure: ESOPHAGOGASTRODUODENOSCOPY (EGD) WITH PROPOFOL;  Surgeon: Yetta Flock,  MD;  Location: [facility];  Service: Gastroenterology;  Laterality: N/A;  . FOOT NEUROMA SURGERY  2002  . LEFT HEART CATHETERIZATION WITH CORONARY ANGIOGRAM Right 06/14/2011   20% LM, chronic occluded mid LAD, 50% ostial LCX, 20% mid RI, RCA with collaterals to mid LAD, mid 10% stenosis, EF 60% 06/14/11  . NASAL SINUS SURGERY    . SHOULDER ARTHROSCOPY  03/01/2011   Procedure: ARTHROSCOPY SHOULDER;  Surgeon: Ninetta Lights, MD;  Location: [facility];  Service: Orthopedics;  Laterality: Right;  arthroscopy shoulder decompression subacromial partial acromioplasty with coracoacromial release, distal claviculectomy, debridement of labrium  . SHOULDER ARTHROSCOPY  03/01/2011   Procedure: ARTHROSCOPY SHOULDER;  Surgeon: Ninetta Lights, MD;  Location: [facility];  Service: Orthopedics;  Laterality: Right;  arthroscopy shoulder decompression subacromial partial acromioplasty with  coracoacromial release, distal claviculectomy, debridement of labrium  . TEE WITHOUT CARDIOVERSION N/A 03/19/2016   Procedure: TRANSESOPHAGEAL ECHOCARDIOGRAM (TEE);  Surgeon: Larey Dresser, MD;  Location: [facility];  Service: Cardiovascular;  Laterality: N/A;  . TOTAL KNEE ARTHROPLASTY Right 09/23/2013   Procedure: TOTAL KNEE ARTHROPLASTY;  Surgeon: Ninetta Lights, MD;  Location: [facility];  Service: Orthopedics;  Laterality: Right;  . TOTAL SHOULDER ARTHROPLASTY  06/28/2011   Procedure: TOTAL SHOULDER ARTHROPLASTY;  Surgeon: Ninetta Lights, MD;  Location: [facility];  Service: Orthopedics;  Laterality: Right;  . UVULOPALATOPHARYNGOPLASTY     Social History   Occupational History  . Occupation: retired  Tobacco Use  . Smoking status: Former Smoker    Packs/day: 2.00    Years: 30.00    Pack years: 60.00    Types: Cigarettes    Last attempt to quit: 02/27/1998    Years since quitting: 20.1  . Smokeless tobacco: Never Used  Substance and Sexual Activity  . Alcohol use: Yes    Alcohol/week: 14.0 standard drinks    Types: 14 Standard drinks or equivalent per week    Comment: daily  . Drug use: No  . Sexual activity: Not on file

## 2018-04-18 ENCOUNTER — Ambulatory Visit (HOSPITAL_COMMUNITY)
Admission: RE | Admit: 2018-04-18 | Discharge: 2018-04-18 | Disposition: A | Discharge status: Home / Self Care | Payer: Medicare Other | Source: Ambulatory Visit | Attending: Internal Medicine | Admitting: Internal Medicine

## 2018-04-18 DIAGNOSIS — R918 Other nonspecific abnormal finding of lung field: Secondary | ICD-10-CM

## 2018-04-18 LAB — GLUCOSE, CAPILLARY: GLUCOSE-CAPILLARY: 109 mg/dL — AB (ref 70–99)

## 2018-04-18 MED ORDER — FLUDEOXYGLUCOSE F - 18 (FDG) INJECTION
10.5300 | Freq: Once | INTRAVENOUS | Status: AC | PRN
  Administered 2018-04-18: 10.53 via INTRAVENOUS

## 2018-04-21 ENCOUNTER — Ambulatory Visit (HOSPITAL_COMMUNITY): Payer: Medicare Other

## 2018-04-22 ENCOUNTER — Ambulatory Visit (INDEPENDENT_AMBULATORY_CARE_PROVIDER_SITE_OTHER): Payer: Medicare Other | Admitting: Internal Medicine

## 2018-04-22 ENCOUNTER — Encounter: Payer: Self-pay | Admitting: Internal Medicine

## 2018-04-22 VITALS — BP 86/58 | HR 87 | Ht 69.0 in | Wt 213.0 lb

## 2018-04-22 DIAGNOSIS — I2510 Atherosclerotic heart disease of native coronary artery without angina pectoris: Secondary | ICD-10-CM | POA: Diagnosis not present

## 2018-04-22 DIAGNOSIS — R918 Other nonspecific abnormal finding of lung field: Secondary | ICD-10-CM

## 2018-04-22 DIAGNOSIS — R0609 Other forms of dyspnea: Secondary | ICD-10-CM

## 2018-04-22 NOTE — Assessment & Plan Note (Signed)
Onset 2017 PFTs  12/16/15  FVC  3.32 (81%) no obst and DLCO 59/58c and 77% with correction for alv vol - Spirometry 01/20/2016  FVC  3.12 (74%) and exp truncation  - 01/20/2016  Walked RA x 3 laps @ 185 ft each stopped due to  End of study, nl pace, mild sob with cough at end  - HRCT CT 04/09/2017 1. Previously noted nodules are stable compared to the prior study. These are strongly favored to be benign. The nodular area of architectural distortion in the posterior aspect of the right lower lobe is most likely an area of post infectious or inflammatory scarring. Repeat noncontrast chest CT could be considered in 12 months to ensure continued stability. 2. Mild diffuse bronchial wall thickening with mild to moderate centrilobular and paraseptal emphysema. 3. Aortic atherosclerosis, in addition to left main and 3 vessel coronary artery disease - 04/15/2017   Walked RA x one lap @ 185 stopped due sob with ok sats but Hgb 6.9 > sent to ER as on xarelto with evidence of possible fluid overload on exam  - PFT's  05/15/2017  FEV1 2.31 (79 % ) ratio 77  p 11 % improvement from saba p 0 prior to study with DLCO  59/63 % corrects to 94  % for alv volume   - 04/22/2018   Walked RA  2 laps @  approx 222ft each @ relatively fast pace  stopped due to  End of study with sats 91%   - Spirometry 04/22/2018  FEV1 1.6 (56%)  Ratio 0.74 min curvature s prior rx    He would have a hard time tolerating a RLLobectomy and already complains of occasions of sob with minimal activity though did fine here.    No additional pulmonary meds needed at this point - see spn a/p

## 2018-04-22 NOTE — Patient Instructions (Signed)
Please see patient coordinator before you leave today  to schedule CT biopsy of the Right lower lobe   Stop xarelto for at least  two doses prior to the day of the day of the biopsy.

## 2018-04-22 NOTE — Progress Notes (Signed)
Subjective:    Patient ID: George Vega, male    DOB: March 11, 1941,    MRN: [id number]  Brief patient profile:   65 yowm quit smoking 1999 slowed down by arthritis but new waking from sleeping with sob x around 11/2015 referred to pulmonary clinic 01/20/2016 by Dr  Lovena Le with abn pfts 12/16/15      History of Present Illness  01/20/2016 1st [facility] office visit/ George Vega   Chief Complaint  Patient presents with  . Pulmonary Consult    Referred by Dr. Lovena Le. Pt c/o SOB off and on for the past 2 months. He states it mainly bothers him when he lies down. He occ gets SOB after he exerts himself.    wakes up sob x 2 months once a week x 20 sec, couple dep breaths and feels better.  Also wife hears noisy breathing at rest during same time period/ pt has sense of pnds and freq clearing his throat daytime only  He's more concerned with fatigue limiting activity tol than sob  Note has h/o acei cough remotely, resolved on arb rec Omeprazole 40   Take  30-60 min before first meal of the day and Pepcid (famotidine)  20 mg one @  bedtime until return to office -did not do GERD  Diet ? followed Please remember to go to the lab Allergy profile 01/20/2016 >  Eos 0.6 /  IgE  319 with RAST Mold > dog ? Low grade ABPA ? 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 Pulmonary Consult    Referred by [place] Mole, PA for eval of abnormal low dose CT Chest- done 10/03/16. Pt denies any respiratory co's today.   pt did not actually qualify for low dose screening Ct as > 15 years since quit but the low dose scan showed multiple bilateral nodules so referred to pulmonary clinic 10/09/2016 by Roe Coombs Not limited by breathing from desired activities   Still having nasal congestion and urge to clear throat and never took gerd trial  "it's just not that bad"  Rec We will contact you in 6 months for follow up CT chest - call sooner if new chest pain or unexplained cough      04/15/2017  f/u ov/George Vega re:  MPNs / worse doe x one month Chief Complaint  Patient presents with  . Follow-up    Pt states he is having worsening SOB mainly with exertion. Denies any cough or CP.  Dyspnea:  MMRC3 = can't walk 100 yards even at a slow pace at a flat grade s stopping due to sob   Cough: no Sleep: ok L side down / 6 in under hob > once settles down does ok  Has had increased swelling L leg and pallor over last month also / no obvious bleeding on xarelto  rec Pantoprazole should be Take 30-60 min before first meal of the day  Please remember to go to the lab department downstairs in the basement  for your tests - we will call you with the results when they are available. hgb 6.9 > admit  Admit date: 04/15/2017 Discharge date: 04/18/2017  Time spent: 45 minutes  Recommendations for Outpatient Follow-up:  Patient will be discharged to home.  Patient will need to follow up with  primary care provider within one week of discharge.  Follow-up with Dr. Beryle Beams, hematologist.  Follow-up with Dr. Havery Moros, gastroenterologist.  Patient should continue medications as prescribed.  Patient should follow a heart healthy diet.   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 Follow-up    PFT's today. Breathing has improved back to his normal baseline. No new co's.   Dyspnea:  Steps ok now, really Not limited by breathing from desired activities   Cough: no Sleep: ok 5 in blocks  rec F/u fr ct's only    04/22/2018  f/u ov/George Vega re: doe/ abn cxr  Chief  Complaint  Patient presents with  . Follow-up    Here to discuss PET scan results. He has occ SOB that comes and goes.   Dyspnea:  Very sedentary / ok flat/ most he does = 3 steps no problems/ legs tend to stop him before his breathing  Cough: minimal / mucoid in am never bloody  Sleeping: lie flat one pillow  SABA use: none 02: none    No obvious day to day or daytime variability or assoc excess/ purulent sputum or mucus plugs or hemoptysis or cp or chest tightness, subjective wheeze or overt sinus or hb symptoms.   Sleeping as bove  without nocturnal  or early am exacerbation  of respiratory  c/o's or need for noct saba. Also denies any obvious fluctuation of symptoms with weather or environmental changes or other aggravating or alleviating factors except as outlined above   No unusual exposure hx or h/o childhood pna/ asthma or knowledge of premature birth.  Current Allergies, Complete Past Medical History, Past Surgical History, Family History, and Social History were reviewed in [employer] record.  ROS  The following are not active complaints unless bolded Hoarseness, sore throat, dysphagia, dental problems, itching, sneezing,  nasal congestion or discharge of excess mucus or purulent secretions, ear ache,   fever, chills, sweats, unintended wt loss or wt gain, classically pleuritic or exertional cp,  orthopnea pnd or arm/hand swelling  or leg swelling, presyncope, palpitations, abdominal pain, anorexia, nausea, vomiting, diarrhea  or change in bowel habits or change in bladder habits, change in stools or change in urine, dysuria, hematuria,  rash, arthralgias, visual complaints, headache, numbness, weakness or ataxia or problems with walking or coordination,  change in mood or  memory.        Current Meds  Medication Sig  . acetaminophen (TYLENOL) 325 MG tablet Take 650 mg by mouth every 6 (six) hours as needed for moderate pain.  Marland Kitchen amLODipine (NORVASC) 5 MG  tablet TAKE 1 TABLET BY MOUTH  DAILY  . Cholecalciferol (VITAMIN D3) 5000 units TABS Take 5,000 Units by mouth daily.  . Cyanocobalamin (VITAMIN B-12) 500 MCG SUBL Place 500 mcg under the tongue 3 (three) times a week.   . dofetilide (TIKOSYN) 250 MCG capsule TAKE 1 CAPSULE BY MOUTH TWO TIMES DAILY  . FEROSUL 325 (65 Fe) MG tablet TAKE 1 TABLET BY MOUTH TWICE DAILY  . furosemide (LASIX) 40 MG tablet TAKE 1 TABLET(40 MG) BY MOUTH DAILY  . irbesartan (AVAPRO) 150 MG tablet TAKE 1 TABLET(150 MG) BY MOUTH DAILY  . magnesium oxide (MAG-OX) 400 MG tablet Take 400 mg by mouth daily.  Marland Kitchen  Melatonin 10 MG TABS Take 10 mg by mouth at bedtime as needed (sleep).   . metoprolol tartrate (LOPRESSOR) 25 MG tablet TAKE 1 TABLET BY MOUTH TWO  TIMES DAILY  . Multiple Vitamins-Minerals (PRESERVISION AREDS 2 PO) Take 1 capsule by mouth 2 (two) times daily.  . pantoprazole (PROTONIX) 40 MG tablet TAKE 1 TABLET(40 MG) BY MOUTH DAILY  . PARoxetine (PAXIL) 30 MG tablet Take 30 mg by mouth at bedtime.   . polycarbophil (FIBERCON) 625 MG tablet Take 1,250 mg by mouth at bedtime.   [name] Glycol-Propyl Glycol (SYSTANE OP) Place 2 drops into both eyes 2 (two) times daily as needed (dry eyes).   . potassium chloride (K-DUR) 10 MEQ tablet Take 1 tablet (10 mEq total) by mouth daily.  . rosuvastatin (CRESTOR) 5 MG tablet TAKE 1 TABLET BY MOUTH  EVERY MORNING  . tamsulosin (FLOMAX) 0.4 MG CAPS capsule Take 0.4 mg by mouth daily.  Alveda Reasons 15 MG TABS tablet TAKE 1 TABLET BY MOUTH  DAILY WITH SUPPER            Objective:   Physical Exam  amb wm nad   04/22/2018     213  05/15/2017        212   04/15/2017        222  10/09/2016      222   01/20/16 218 lb 9.6 oz (99.2 kg)  12/14/15 212 lb (96.2 kg)  11/17/15 211 lb 4.8 oz (95.8 kg)     Vital signs reviewed - Note on arrival 02 sats  96% on RA   And bp 86/58 s orthostatic symptoms      HEENT: nl dentition, turbinates bilaterally, and oropharynx. Nl external ear  canals without cough reflex   NECK :  without JVD/Nodes/TM/ nl carotid upstrokes bilaterally   LUNGS: no acc muscle use,  Nl contour chest which is clear to A and P bilaterally without cough on insp or exp maneuvers   CV:  IRIR  no s3 or murmur or increase in P2, and no edema   ABD:  soft and nontender with nl inspiratory excursion in the supine position. No bruits or organomegaly appreciated, bowel sounds nl  MS:  Nl gait/ ext warm without deformities, calf tenderness, cyanosis or clubbing No obvious joint restrictions   SKIN: warm and dry without lesions    NEURO:  alert, approp, nl sensorium with  no motor or cerebellar deficits apparent.          I personally reviewed images and agree with radiology impression as follows:  PET  04/18/18 1. Hypermetabolic 2.8 cm posterior right lower lobe pulmonary nodule, compatible with primary bronchogenic carcinoma. 2. No hypermetabolic thoracic adenopathy or distant metastatic disease. PET-CT stage IA (T1c N0 M0).     Chest CT 04/09/2018 1. Subpleural right lower lobe nodule has increased in size and is worrisome for adenocarcinoma. PET may be helpful in further evaluation, as indicated. 2. Interval resolution in soft tissue thickening at the confluence of the minor and right major fissures. 3. Subpleural fibrosis stable to minimally progressive superimposed on Emphysema (ICD10-J43.9). 4. Cirrhosis. 5. Cholelithiasis. 6. Left renal stones. 7. Aortic atherosclerosis (ICD10-170.0). Coronary artery calcification.           Assessment & Plan:

## 2018-04-28 ENCOUNTER — Other Ambulatory Visit: Payer: Self-pay | Admitting: Student

## 2018-04-29 ENCOUNTER — Ambulatory Visit (HOSPITAL_COMMUNITY)
Admission: RE | Admit: 2018-04-29 | Discharge: 2018-04-29 | Disposition: A | Discharge status: Home / Self Care | Payer: Medicare Other | Source: Ambulatory Visit | Attending: Interventional Radiology | Admitting: Interventional Radiology

## 2018-04-29 ENCOUNTER — Other Ambulatory Visit: Payer: Self-pay

## 2018-04-29 ENCOUNTER — Ambulatory Visit (HOSPITAL_COMMUNITY)
Admission: RE | Admit: 2018-04-29 | Discharge: 2018-04-29 | Disposition: A | Discharge status: Home / Self Care | Payer: Medicare Other | Source: Ambulatory Visit | Attending: Internal Medicine | Admitting: Internal Medicine

## 2018-04-29 DIAGNOSIS — Z7901 Long term (current) use of anticoagulants: Secondary | ICD-10-CM | POA: Insufficient documentation

## 2018-04-29 DIAGNOSIS — R911 Solitary pulmonary nodule: Secondary | ICD-10-CM | POA: Diagnosis not present

## 2018-04-29 DIAGNOSIS — I10 Essential (primary) hypertension: Secondary | ICD-10-CM | POA: Diagnosis not present

## 2018-04-29 DIAGNOSIS — Z8673 Personal history of transient ischemic attack (TIA), and cerebral infarction without residual deficits: Secondary | ICD-10-CM | POA: Insufficient documentation

## 2018-04-29 DIAGNOSIS — F329 Major depressive disorder, single episode, unspecified: Secondary | ICD-10-CM | POA: Diagnosis not present

## 2018-04-29 DIAGNOSIS — K589 Irritable bowel syndrome without diarrhea: Secondary | ICD-10-CM | POA: Diagnosis not present

## 2018-04-29 DIAGNOSIS — I4819 Other persistent atrial fibrillation: Secondary | ICD-10-CM | POA: Diagnosis not present

## 2018-04-29 DIAGNOSIS — I2510 Atherosclerotic heart disease of native coronary artery without angina pectoris: Secondary | ICD-10-CM | POA: Insufficient documentation

## 2018-04-29 DIAGNOSIS — I4891 Unspecified atrial fibrillation: Secondary | ICD-10-CM | POA: Insufficient documentation

## 2018-04-29 DIAGNOSIS — Z87891 Personal history of nicotine dependence: Secondary | ICD-10-CM | POA: Insufficient documentation

## 2018-04-29 DIAGNOSIS — Z79899 Other long term (current) drug therapy: Secondary | ICD-10-CM | POA: Diagnosis not present

## 2018-04-29 DIAGNOSIS — M1990 Unspecified osteoarthritis, unspecified site: Secondary | ICD-10-CM | POA: Insufficient documentation

## 2018-04-29 DIAGNOSIS — R0609 Other forms of dyspnea: Secondary | ICD-10-CM | POA: Diagnosis not present

## 2018-04-29 LAB — CBC
HCT: 42.7 % (ref 39.0–52.0)
Hemoglobin: 13.3 g/dL (ref 13.0–17.0)
MCH: 30.5 pg (ref 26.0–34.0)
MCHC: 31.1 g/dL (ref 30.0–36.0)
MCV: 97.9 fL (ref 80.0–100.0)
Platelets: 189 10*3/uL (ref 150–400)
RBC: 4.36 MIL/uL (ref 4.22–5.81)
RDW: 12.6 % (ref 11.5–15.5)
WBC: 8.2 10*3/uL (ref 4.0–10.5)
nRBC: 0 % (ref 0.0–0.2)

## 2018-04-29 LAB — PROTIME-INR
INR: 1.11
PROTHROMBIN TIME: 14.2 s (ref 11.4–15.2)

## 2018-04-29 LAB — APTT: APTT: 35 s (ref 24–36)

## 2018-04-29 MED ORDER — SODIUM CHLORIDE 0.9 % IV SOLN: INTRAVENOUS | Status: DC

## 2018-04-29 MED ORDER — MIDAZOLAM HCL 2 MG/2ML IJ SOLN
INTRAMUSCULAR | Status: AC
  Filled 2018-04-29: qty 2

## 2018-04-29 MED ORDER — MIDAZOLAM HCL 2 MG/2ML IJ SOLN
INTRAMUSCULAR | Status: AC | PRN
  Administered 2018-04-29: 0.5 mg via INTRAVENOUS

## 2018-04-29 MED ORDER — FENTANYL CITRATE (PF) 100 MCG/2ML IJ SOLN
INTRAMUSCULAR | Status: AC
  Filled 2018-04-29: qty 2

## 2018-04-29 MED ORDER — FENTANYL CITRATE (PF) 100 MCG/2ML IJ SOLN
INTRAMUSCULAR | Status: AC | PRN
  Administered 2018-04-29: 25 ug via INTRAVENOUS

## 2018-04-29 MED ORDER — LIDOCAINE HCL 1 % IJ SOLN
INTRAMUSCULAR | Status: AC
  Filled 2018-04-29: qty 20

## 2018-04-29 NOTE — Assessment & Plan Note (Signed)
Chest LDCT   10/03/16 Scattered calcified granulomas. A pleural-based right lower lobe density dependently measures volume derived equivalent diameter 13.4 mm, including on image 157/series 3. Other noncalcified pulmonary nodules, the largest of which is positioned within the lingula and measures volume derived equivalent diameter 5.5 mm on image 183/series 3. - HRCT 04/09/2017 1. Previously noted nodules are stable compared to the prior study. These are strongly favored to be benign. The nodular area of architectural distortion in the posterior aspect of the right lower lobe is most likely an area of post infectious or inflammatory scarring. Repeat noncontrast chest CT  rec at 12 m - CT chest w/o contrast ? Why done 05/08/2017  : 1. Increase in soft tissue thickening about the confluence of the right major and minor fissures. Nonspecific. Consider chest CT follow-up at 3-6 months. 2. Otherwise, similar appearance of the chest compared to 04/09/2017.   CT 04/09/18  1. Subpleural right lower lobe nodule has increased in size and is worrisome for adenocarcinoma. PET may be helpful in further evaluation, as indicated. 2. Interval resolution in soft tissue thickening at the confluence of the minor and right major fissures. PET  04/18/18 1. Hypermetabolic 2.8 cm posterior right lower lobe pulmonary nodule, compatible with primary bronchogenic carcinoma. 2. No hypermetabolic thoracic adenopathy or distant metastatic disease. PET-CT stage IA (T1c N0 M0).   He is not at all inclined to have surgery at this point and def would carry significant risk but willing to pursue tissue dx which I believe is relatively straightforward with need to hold DOAC x sev doses an acceptable risk in this setting.  Discussed in detail all the  indications, usual  risks and alternatives  relative to the benefits with patient who agrees to proceed with w/u with CT directed bx RLL and refer to [facility] once we have a  clinical dx.   I had an extended discussion with the patient/daughter and wife  reviewing all relevant studies completed to date and  lasting 25 minutes of a 40  minute summary office  Visit re preop evaluation/ personally observing walking 02 sat study, and contingencies for disposition depending on results of bx results and risks for stopping doac short term.    Each maintenance medication was reviewed in detail including most importantly the difference between maintenance and prns and under what circumstances the prns are to be triggered using an action plan format that is not reflected in the computer generated alphabetically organized AVS.    Please see AVS for specific instructions unique to this office visit that I personally wrote and verbalized to the the pt in detail and then reviewed with pt  by my nurse highlighting any changes in therapy/plan of care  recommended at today's visit.

## 2018-04-29 NOTE — H&P (Signed)
Chief Complaint: Patient was seen in consultation today for lung biopsy.  Referring Physician(s): Wert,Bradyn B  Supervising Physician: Daryll Brod  Patient Status: [facility] - Out-pt  History of Present Illness: George Vega is a 78 y.o. male with a past medical history significant for depression, arthritis, CAD, a.fib on Xarelto, CVA and HTN who presents today for a right lower lung biopsy. Patient is followed by Dr. Melvyn Novas for dyspnea on exertion and pulmonary nodule, last visit 04/22/18. He underwent surveillance CT chest w/o contrast on 04/09/18 which showed increase in size of the previously known right lower lobe nodule. PET scan was then obtained on 04/19/18 which showed hypermetabolic activity in the 2.8 cm posterior right lower lobe pulmonary nodule, compatible with primary bronchogenic carcinoma. Request has been made to IR for biopsy of this nodule for further evaluation and management.  Patient states he feels fine, has no complaints at all currently. He is looking forward to eating and having a bourbon tonight. He is a little concerned about sedation due to his heart issues, he denies having heart issues with anesthesia previously during colonoscopies.   Past Medical History:  Diagnosis Date  . Arthritis   . Atrial fibrillation, persistent   . Chronic sinusitis   . Coronary artery disease   . Depression   . Difficult intubation    was told with shoulder done 2006-alittle narrow  . 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 Hypertension   . Jejunostomy tube fell out    when asked about this in 04/2017, pt denied ever having a J tube, feeding tube, tubes placed post surgery so ??? veracity of a previous J tube.    . 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 Spastic colon   . Stroke (cerebrum) ([place])   . SVT (supraventricular tachycardia) (H[facility])     Past Surgical History:  Procedure Laterality Date  . COLONOSCOPY WITH PROPOFOL N/A 04/17/2017   Procedure: COLONOSCOPY  WITH PROPOFOL;  Surgeon: Yetta Flock, MD;  Location: [place];  Service: Gastroenterology;  Laterality: N/A;  . ESOPHAGEAL DILATION  2017  . ESOPHAGOGASTRODUODENOSCOPY (EGD) WITH PROPOFOL N/A 04/17/2017   Procedure: ESOPHAGOGASTRODUODENOSCOPY (EGD) WITH PROPOFOL;  Surgeon: Yetta Flock, MD;  Location: [place];  Service: Gastroenterology;  Laterality: N/A;  . FOOT NEUROMA SURGERY  2002  . LEFT HEART CATHETERIZATION WITH CORONARY ANGIOGRAM Right 06/14/2011   20% LM, chronic occluded mid LAD, 50% ostial LCX, 20% mid RI, RCA with collaterals to mid LAD, mid 10% stenosis, EF 60% 06/14/11  . NASAL SINUS SURGERY    . SHOULDER ARTHROSCOPY  03/01/2011   Procedure: ARTHROSCOPY SHOULDER;  Surgeon: Ninetta Lights, MD;  Location: [place];  Service: Orthopedics;  Laterality: Right;  arthroscopy shoulder decompression subacromial partial acromioplasty with coracoacromial release, distal claviculectomy, debridement of labrium  . SHOULDER ARTHROSCOPY  03/01/2011   Procedure: ARTHROSCOPY SHOULDER;  Surgeon: Ninetta Lights, MD;  Location: [place];  Service: Orthopedics;  Laterality: Right;  arthroscopy shoulder decompression subacromial partial acromioplasty with coracoacromial release, distal claviculectomy, debridement of labrium  . TEE WITHOUT CARDIOVERSION N/A 03/19/2016   Procedure: TRANSESOPHAGEAL ECHOCARDIOGRAM (TEE);  Surgeon: Larey Dresser, MD;  Location: [place];  Service: Cardiovascular;  Laterality: N/A;  . TOTAL KNEE ARTHROPLASTY Right 09/23/2013   Procedure: TOTAL KNEE ARTHROPLASTY;  Surgeon: Ninetta Lights, MD;  Location: [place];  Service: Orthopedics;  Laterality: Right;  . TOTAL SHOULDER ARTHROPLASTY  06/28/2011   Procedure: TOTAL SHOULDER  ARTHROPLASTY;  Surgeon: Ninetta Lights, MD;  Location: [place];  Service: Orthopedics;  Laterality: Right;  . UVULOPALATOPHARYNGOPLASTY      Allergies: Glucosamine-chondroitin;  Tetanus toxoid; Fish oil; Ibuprofen; and Naproxen  Medications: Prior to Admission medications   Medication Sig Start Date End Date Taking? Authorizing Provider  acetaminophen (TYLENOL) 325 MG tablet Take 650 mg by mouth every 6 (six) hours as needed for moderate pain.   Yes [provider]  amLODipine (NORVASC) 5 MG tablet TAKE 1 TABLET BY MOUTH  DAILY Patient taking differently: Take 5 mg by mouth daily.  02/14/17  Yes Evans Lance, MD  Cholecalciferol (VITAMIN D3) 5000 units TABS Take 5,000 Units by mouth daily.   Yes [provider]  dofetilide (TIKOSYN) 250 MCG capsule TAKE 1 CAPSULE BY MOUTH TWO TIMES DAILY Patient taking differently: Take 250 mcg by mouth 2 (two) times daily.  03/24/18  Yes Evans Lance, MD  FEROSUL 325 (65 Fe) MG tablet TAKE 1 TABLET BY MOUTH TWICE DAILY Patient taking differently: Take 325 mg by mouth 2 (two) times daily with a meal.  01/08/18  Yes Bartholomew Crews, MD  furosemide (LASIX) 40 MG tablet TAKE 1 TABLET(40 MG) BY MOUTH DAILY Patient taking differently: Take 40 mg by mouth daily.  01/08/18  Yes Evans Lance, MD  irbesartan (AVAPRO) 150 MG tablet TAKE 1 TABLET(150 MG) BY MOUTH DAILY Patient taking differently: Take 150 mg by mouth daily. TAKE 1 TABLET(150 MG) BY MOUTH DAILY 12/24/17  Yes Evans Lance, MD  magnesium oxide (MAG-OX) 400 MG tablet Take 400 mg by mouth daily.   Yes [provider]  metoprolol tartrate (LOPRESSOR) 25 MG tablet TAKE 1 TABLET BY MOUTH TWO  TIMES DAILY Patient taking differently: Take 25 mg by mouth 2 (two) times daily.  02/14/17  Yes Evans Lance, MD  Multiple Vitamins-Minerals (PRESERVISION AREDS 2 PO) Take 1 capsule by mouth 2 (two) times daily.   Yes [provider]  pantoprazole (PROTONIX) 40 MG tablet Take 40 mg by mouth daily.  02/22/17  Yes [provider]  PARoxetine (PAXIL) 30 MG tablet Take 30 mg by mouth at bedtime.  01/25/17  Yes [provider]    polycarbophil (FIBERCON) 625 MG tablet Take 1,250 mg by mouth at bedtime.    Yes [provider]  Polyethyl Glycol-Propyl Glycol (SYSTANE OP) Place 2 drops into both eyes 2 (two) times daily as needed (dry eyes).    Yes [provider]  potassium chloride (K-DUR) 10 MEQ tablet Take 1 tablet (10 mEq total) by mouth daily. 04/24/17  Yes Evans Lance, MD  rosuvastatin (CRESTOR) 5 MG tablet TAKE 1 TABLET BY MOUTH  EVERY MORNING Patient taking differently: Take 5 mg by mouth daily.  11/28/17  Yes Evans Lance, MD  tamsulosin (FLOMAX) 0.4 MG CAPS capsule Take 0.4 mg by mouth daily. 12/04/16  Yes [provider]  XARELTO 15 MG TABS tablet TAKE 1 TABLET BY MOUTH  DAILY WITH SUPPER Patient taking differently: Take 15 mg by mouth daily with supper.  03/25/18  Yes Camnitz, Will Hassell Done, MD  Cyanocobalamin (VITAMIN B-12) 500 MCG SUBL Place 500 mcg under the tongue 3 (three) times a week.     [provider]  Melatonin 10 MG TABS Take 10 mg by mouth at bedtime as needed (sleep).     [provider]     Family History  Problem Relation Age of Onset  . Cancer  Mother        colon  . Heart attack Father   . Migraines Brother     Social History   Socioeconomic History  . Marital status: Married    Spouse name: Not on file  . Number of children: 3  . Years of education: Not on file  . Highest education level: Not on file  Occupational History  . Occupation: retired  Scientific laboratory technician  . Financial resource strain: Not on file  . Food insecurity:    Worry: Not on file    Inability: Not on file  . Transportation needs:    Medical: Not on file    Non-medical: Not on file  Tobacco Use  . Smoking status: Former Smoker    Packs/day: 2.00    Years: 30.00    Pack years: 60.00    Types: Cigarettes    Last attempt to quit: 02/27/1998    Years since quitting: 20.1  . Smokeless tobacco: Never Used  Substance and Sexual Activity  . Alcohol use: Yes     Alcohol/week: 14.0 standard drinks    Types: 14 Standard drinks or equivalent per week    Comment: daily  . Drug use: No  . Sexual activity: Not on file  Lifestyle  . Physical activity:    Days per week: Not on file    Minutes per session: Not on file  . Stress: Not on file  Relationships  . Social connections:    Talks on phone: Not on file    Gets together: Not on file    Attends religious service: Not on file    Active member of club or organization: Not on file    Attends meetings of clubs or organizations: Not on file    Relationship status: Not on file  Other Topics Concern  . Not on file  Social History Narrative   Patient is left handed.   Patient drinks one cup caffeine daily.     Review of Systems: A 12 point ROS discussed and pertinent positives are indicated in the HPI above.  All other systems are negative.  Review of Systems  Constitutional: Negative for appetite change, chills, fever and unexpected weight change.  Respiratory: Positive for cough (in the morning sometimes; states due to post nasal drip). Negative for shortness of breath.   Cardiovascular: Negative for chest pain.  Gastrointestinal: Negative for abdominal pain, blood in stool, diarrhea, nausea and vomiting.  Genitourinary: Negative for dysuria and hematuria.  Musculoskeletal: Negative for back pain.  Neurological: Negative for dizziness, syncope and headaches.  Psychiatric/Behavioral: Negative for confusion.    Vital Signs: BP 114/83   Pulse 79   Temp 97.8 F (36.6 C) (Oral)   Resp 18   Ht 5\' 8"  (1.727 m)   Wt 214 lb (97.1 kg)   SpO2 94%   BMI 32.54 kg/m   Physical Exam Vitals signs reviewed.  Constitutional:      General: He is not in acute distress.    Appearance: Normal appearance.  HENT:     Head: Normocephalic.  Cardiovascular:     Rate and Rhythm: Normal rate and regular rhythm.  Pulmonary:     Effort: Pulmonary effort is normal.  Abdominal:     Palpations: Abdomen is  soft.     Tenderness: There is no abdominal tenderness.  Skin:    General: Skin is warm and dry.  Neurological:     Mental Status: He is alert and oriented to person, place, and  time.  Psychiatric:        Mood and Affect: Mood normal.        Behavior: Behavior normal.        Thought Content: Thought content normal.        Judgment: Judgment normal.      MD Evaluation Airway: WNL Heart: WNL Abdomen: WNL Chest/ Lungs: WNL ASA  Classification: 3 Mallampati/Airway Score: One   Imaging: Ct Chest Wo Contrast  Result Date: 04/09/2018 CLINICAL DATA:  Pulmonary nodules, shortness of breath. EXAM: CT CHEST WITHOUT CONTRAST TECHNIQUE: Multidetector CT imaging of the chest was performed following the standard protocol without IV contrast. COMPARISON:  05/07/2017, 04/09/2017 and 10/03/2016. FINDINGS: Cardiovascular: Atherosclerotic calcification of the aorta, aortic valve and coronary arteries. Heart is at the upper limits of normal in size. No pericardial effusion. Mediastinum/Nodes: Mediastinal lymph nodes are not enlarged by CT size criteria. Hilar regions are difficult to evaluate without IV contrast. No axillary adenopathy. Esophagus is grossly unremarkable. Lungs/Pleura: Centrilobular emphysema. New peribronchovascular nodularity in the posterior segment is likely infectious or post infectious in etiology. Soft tissue nodule in the subpleural right lower lobe measures 1.4 x 2.9 cm (series 3, image 68), previously 0.9 x 2.1 cm on 05/07/2017. Interval resolution in soft tissue thickening at the confluence of the minor and right major fissures. Left lower lobe nodules measure 4 mm or less size, unchanged and considered benign. Peripheral subpleural reticulation/ground-glass and traction bronchiolectasis, similar to slightly progressive. No pleural fluid. Airway is unremarkable. Upper Abdomen: Liver margin is irregular. A stone is seen in the gallbladder. Visualized portions the adrenal glands and  right kidney are unremarkable. Stones are seen in the left kidney. Visualized portions of the spleen, pancreas, stomach and bowel are grossly unremarkable. No upper abdominal adenopathy. Musculoskeletal: Degenerative changes in the spine. Bilateral shoulder arthroplasties. IMPRESSION: 1. Subpleural right lower lobe nodule has increased in size and is worrisome for adenocarcinoma. PET may be helpful in further evaluation, as indicated. 2. Interval resolution in soft tissue thickening at the confluence of the minor and right major fissures. 3. Subpleural fibrosis stable to minimally progressive superimposed on Emphysema (ICD10-J43.9). 4. Cirrhosis. 5. Cholelithiasis. 6. Left renal stones. 7. Aortic atherosclerosis (ICD10-170.0). Coronary artery calcification. Electronically Signed   By: Lorin Picket M.D.   On: 04/09/2018 13:46   Nm Pet Image Initial (pi) Skull Base To Thigh  Result Date: 04/19/2018 CLINICAL DATA:  Initial treatment strategy for enlarging right lower lobe pulmonary nodule. EXAM: NUCLEAR MEDICINE PET SKULL BASE TO THIGH TECHNIQUE: 10.5 mCi F-18 FDG was injected intravenously. Full-ring PET imaging was performed from the skull base to thigh after the radiotracer. CT data was obtained and used for attenuation correction and anatomic localization. Fasting blood glucose: 109 mg/dl COMPARISON:  04/09/2018 chest CT. FINDINGS: Mediastinal blood pool activity: SUV max 3.1 NECK: No hypermetabolic lymph nodes in the neck. Incidental CT findings: none CHEST: Hypermetabolic 2.8 x 1.4 cm posterior right lower lobe pulmonary nodule with max SUV 10.1 (series 8/image 34). Sub solid 1.3 cm peripheral left upper lobe pulmonary nodule with low level metabolism with max SUV 2.2 (series 8/image 32), new since 04/09/2018 chest CT. No additional hypermetabolic pulmonary findings. No hypermetabolic axillary, mediastinal or hilar lymph nodes. Incidental CT findings: Coronary atherosclerosis. Atherosclerotic nonaneurysmal  thoracic aorta. Mild-to-moderate centrilobular and paraseptal emphysema with diffuse bronchial wall thickening. Patchy subpleural reticulation and ground-glass attenuation throughout both lungs, unchanged. ABDOMEN/PELVIS: No abnormal hypermetabolic activity within the liver, pancreas, adrenal glands, or spleen. No hypermetabolic lymph nodes  in the abdomen or pelvis. Intense focal hypermetabolism in the right prostate with max SUV 13.0. Incidental CT findings: Mildly irregular liver surface, unchanged, suggesting up attic cirrhosis. Cholelithiasis. Nonobstructing 4 mm upper left renal stone. Exophytic hyperdense subcentimeter renal cortical lesion in the lower right kidney, which requires no follow-up. Atherosclerotic nonaneurysmal abdominal aorta. Marked sigmoid diverticulosis. SKELETON: No focal hypermetabolic activity to suggest skeletal metastasis. Incidental CT findings: Bilateral total shoulder arthroplasty. IMPRESSION: 1. Hypermetabolic 2.8 cm posterior right lower lobe pulmonary nodule, compatible with primary bronchogenic carcinoma. 2. No hypermetabolic thoracic adenopathy or distant metastatic disease. PET-CT stage IA (T1c N0 M0). 3. Intense focal hypermetabolism in the right prostate, can not exclude prostate malignancy. Recommend correlation with serum PSA and urology consultation. 4. Subsolid 1.3 cm peripheral left upper lobe pulmonary nodule with low level metabolism, new since recent chest CT dated 04/09/2018, more likely inflammatory. Recommend attention on follow-up chest CT in 3 months. 5. Morphologic changes of hepatic cirrhosis. 6. Cholelithiasis. 7. Marked sigmoid diverticulosis. 8. Nonobstructing left nephrolithiasis. 9. Aortic Atherosclerosis (ICD10-I70.0) and Emphysema (ICD10-J43.9). Electronically Signed   By: Ilona Sorrel M.D.   On: 04/19/2018 00:43   Xr Knee 3 View Left  Result Date: 04/13/2018 AP lateral merchant left knee reviewed.  Medial joint space narrowing is present.  Mild to  moderate patellofemoral arthritis is noted.  No fracture dislocation is noted.  Slight varus alignment is present.  No loose bodies.  Bone quality looks good.  Xr Knee 3 View Right  Result Date: 04/13/2018 AP lateral right knee reviewed.  Cemented posterior cruciate sacrificing total knee replacement in good position alignment with no evidence of lucency around the implants.  Patella well centered in the trochlear groove.  No complicating features   Labs:  CBC: Recent Labs    05/08/17 0828 05/27/17 1354 08/28/17 2151 04/29/18 0858  WBC 9.1 8.2 8.9 8.2  HGB 12.9* 13.4 14.1 13.3  HCT 41.8 43.1 43.0 42.7  PLT 304 198 223 189    COAGS: Recent Labs    04/29/18 0858  INR 1.11  APTT 35    BMP: Recent Labs    05/08/17 0828 08/28/17 2151  NA 140 138  K 4.1 4.3  CL 99 102  CO2 27 27  GLUCOSE 108* 114*  BUN 17 15  CALCIUM 9.5 9.3  CREATININE 1.00 1.03  GFRNONAA 73 >60  GFRAA 84 >60    LIVER FUNCTION TESTS: No results for input(s): BILITOT, AST, ALT, ALKPHOS, PROT, ALBUMIN in the last 8760 hours.  TUMOR MARKERS: No results for input(s): AFPTM, CEA, CA199, CHROMGRNA in the last 8760 hours.  Assessment and Plan:  Patient with known pulmonary nodule followed by Dr. Melvyn Novas - recent imaging shows enlargement of RLL, PET on 04/19/18 shows hypermetabolic activity in the same nodule. Request has been made for biopsy of the right lower lobe nodule to further direct management.  Patient has been NPO since 11 pm last night, last dose of Xarelto 2/15. Afebrile, WBC 8.2, hgb 13.3, plt 189, INR 1.11.  Risks and benefits discussed with the patient including, but not limited to bleeding, hemoptysis, respiratory failure requiring intubation, infection, pneumothorax requiring chest tube placement, stroke from air embolism or even death.  All of the patient's questions were answered, patient is agreeable to proceed.  Consent signed and in chart.  Thank you for this interesting consult.   I greatly enjoyed meeting TYION BOYLEN and look forward to participating in their care.  A copy of this report was sent  to the requesting provider on this date.  Electronically Signed: Joaquim Nam, PA-C 04/29/2018, 9:47 AM   I spent a total of  30 Minutes  in face to face in clinical consultation, greater than 50% of which was counseling/coordinating care for lung biopsy.

## 2018-04-29 NOTE — Procedures (Signed)
RLL nodule  S/p CT BX  No comp Stable ebl min Path pending Full report in pacs

## 2018-04-29 NOTE — Discharge Instructions (Addendum)
Needle Biopsy of the Lung, Care After °This sheet gives you information about how to care for yourself after your procedure. Your health care provider may also give you more specific instructions. If you have problems or questions, contact your health care provider. °What can I expect after the procedure? °After the procedure, it is common to have: °· Soreness, pain, and tenderness where a tissue sample was taken (biopsy site). °· A cough. °· A sore throat. °Follow these instructions at home: °Biopsy site care °· Follow instructions from your health care provider about when to remove the bandage that was placed on the biopsy site. °· Keep the bandage dry until it has been removed. °· Check your biopsy site every day for signs of infection. Check for: °? More redness, swelling, or pain. °? More fluid or blood. °? Warmth to the touch. °? Pus or a bad smell. °General instructions ° °· Rest as directed by your health care provider. Ask your health care provider what activities are safe for you. °· Do not take baths, swim, or use a hot tub until your health care provider approves. °· Take over-the-counter and prescription medicines only as told by your health care provider. °· If you have airplane travel scheduled, talk with your health care provider about when it is safe for you to travel by airplane. °· It is up to you to get the results of your procedure. Ask your health care provider, or the department that is doing the procedure, when your results will be ready. °· Keep all follow-up visits as told by your health care provider. This is important. °Contact a health care provider if: °· You have more redness, swelling, or pain around your biopsy site. °· You have more fluid or blood coming from your biopsy site. °· Your biopsy site feels warm to the touch. °· You have pus or a bad smell coming from your biopsy site. °· You have a fever. °· You have pain that does not get better with medicine. °Get help right away  if: °· You have problems breathing. °· You have chest pain. °· You cough up blood. °· You faint. °· You have a fast heart rate. °Summary °· After a needle biopsy of the lung, it is common to have a cough, a sore throat, or soreness, pain, and tenderness where a tissue sample was taken (biopsy site). °· You should check your biopsy area every day for signs of infection, including pus or a bad smell, warmth, more fluid or blood, or more redness, swelling, or pain. °· You should not take baths, swim, or use a hot tub until your health care provider approves. °· It is up to you to get the results of your procedure. Ask your health care provider, or the department that is doing the procedure, when your results will be ready. °This information is not intended to replace advice given to you by your health care provider. Make sure you discuss any questions you have with your health care provider. °Document Released: 12/24/2006 Document Revised: 01/18/2016 Document Reviewed: 01/18/2016 °Elsevier Interactive Patient Education © 2019 [employer] ° ° ° °Moderate Conscious Sedation, Adult, Care After °These instructions provide you with information about caring for yourself after your procedure. Your health care provider may also give you more specific instructions. Your treatment has been planned according to current medical practices, but problems sometimes occur. Call your health care provider if you have any problems or questions after your procedure. °What can I expect   after the procedure? °After your procedure, it is common: °· To feel sleepy for several hours. °· To feel clumsy and have poor balance for several hours. °· To have poor judgment for several hours. °· To vomit if you eat too soon. °Follow these instructions at home: °For at least 24 hours after the procedure: ° °· Do not: °? Participate in activities where you could fall or become injured. °? Drive. °? Use heavy machinery. °? Drink alcohol. °? Take sleeping  pills or medicines that cause drowsiness. °? Make important decisions or sign legal documents. °? Take care of children on your own. °· Rest. °Eating and drinking °· Follow the diet recommended by your health care provider. °· If you vomit: °? Drink water, juice, or soup when you can drink without vomiting. °? Make sure you have little or no nausea before eating solid foods. °General instructions °· Have a responsible adult stay with you until you are awake and alert. °· Take over-the-counter and prescription medicines only as told by your health care provider. °· If you smoke, do not smoke without supervision. °· Keep all follow-up visits as told by your health care provider. This is important. °Contact a health care provider if: °· You keep feeling nauseous or you keep vomiting. °· You feel light-headed. °· You develop a rash. °· You have a fever. °Get help right away if: °· You have trouble breathing. °This information is not intended to replace advice given to you by your health care provider. Make sure you discuss any questions you have with your health care provider. °Document Released: 12/17/2012 Document Revised: 08/01/2015 Document Reviewed: 06/18/2015 °Elsevier Interactive Patient Education © 2019 [employer] ° ° °

## 2018-05-01 ENCOUNTER — Telehealth: Payer: Self-pay | Admitting: Internal Medicine

## 2018-05-01 DIAGNOSIS — C3490 Malignant neoplasm of unspecified part of unspecified bronchus or lung: Secondary | ICD-10-CM

## 2018-05-01 NOTE — Telephone Encounter (Signed)
He knows dx - needs referral to [facility] asap

## 2018-05-01 NOTE — Telephone Encounter (Signed)
I have placed this order nothing further needed.

## 2018-05-01 NOTE — Telephone Encounter (Signed)
Called and spoke to patient. Doesn't look like the results have been read yet. Let patient I would let Dr. Melvyn Novas know and would call back once the results were read.  Dr. Melvyn Novas please advise

## 2018-05-05 ENCOUNTER — Encounter: Payer: Self-pay | Admitting: *Deleted

## 2018-05-06 ENCOUNTER — Other Ambulatory Visit: Payer: Self-pay

## 2018-05-06 ENCOUNTER — Ambulatory Visit (INDEPENDENT_AMBULATORY_CARE_PROVIDER_SITE_OTHER): Payer: Medicare Other | Admitting: Oncology

## 2018-05-06 ENCOUNTER — Telehealth: Payer: Self-pay | Admitting: *Deleted

## 2018-05-06 ENCOUNTER — Encounter: Payer: Self-pay | Admitting: Oncology

## 2018-05-06 VITALS — BP 93/55 | HR 76 | Temp 97.6°F | Ht 69.0 in | Wt 215.0 lb

## 2018-05-06 DIAGNOSIS — I10 Essential (primary) hypertension: Secondary | ICD-10-CM

## 2018-05-06 DIAGNOSIS — Z87891 Personal history of nicotine dependence: Secondary | ICD-10-CM | POA: Diagnosis not present

## 2018-05-06 DIAGNOSIS — I482 Chronic atrial fibrillation, unspecified: Secondary | ICD-10-CM | POA: Diagnosis not present

## 2018-05-06 DIAGNOSIS — I2510 Atherosclerotic heart disease of native coronary artery without angina pectoris: Secondary | ICD-10-CM

## 2018-05-06 DIAGNOSIS — D500 Iron deficiency anemia secondary to blood loss (chronic): Secondary | ICD-10-CM

## 2018-05-06 DIAGNOSIS — C3431 Malignant neoplasm of lower lobe, right bronchus or lung: Secondary | ICD-10-CM

## 2018-05-06 DIAGNOSIS — Z887 Allergy status to serum and vaccine status: Secondary | ICD-10-CM | POA: Diagnosis not present

## 2018-05-06 DIAGNOSIS — Z148 Genetic carrier of other disease: Secondary | ICD-10-CM

## 2018-05-06 DIAGNOSIS — Z9889 Other specified postprocedural states: Secondary | ICD-10-CM | POA: Diagnosis not present

## 2018-05-06 DIAGNOSIS — K31819 Angiodysplasia of stomach and duodenum without bleeding: Secondary | ICD-10-CM

## 2018-05-06 DIAGNOSIS — K5520 Angiodysplasia of colon without hemorrhage: Secondary | ICD-10-CM

## 2018-05-06 DIAGNOSIS — Z886 Allergy status to analgesic agent status: Secondary | ICD-10-CM

## 2018-05-06 DIAGNOSIS — Z888 Allergy status to other drugs, medicaments and biological substances status: Secondary | ICD-10-CM

## 2018-05-06 DIAGNOSIS — Q2733 Arteriovenous malformation of digestive system vessel: Secondary | ICD-10-CM | POA: Diagnosis not present

## 2018-05-06 DIAGNOSIS — Z8711 Personal history of peptic ulcer disease: Secondary | ICD-10-CM

## 2018-05-06 DIAGNOSIS — R918 Other nonspecific abnormal finding of lung field: Secondary | ICD-10-CM

## 2018-05-06 DIAGNOSIS — K648 Other hemorrhoids: Secondary | ICD-10-CM | POA: Diagnosis not present

## 2018-05-06 DIAGNOSIS — C3491 Malignant neoplasm of unspecified part of right bronchus or lung: Secondary | ICD-10-CM

## 2018-05-06 DIAGNOSIS — Z7901 Long term (current) use of anticoagulants: Secondary | ICD-10-CM

## 2018-05-06 NOTE — Patient Instructions (Addendum)
To lab today  Good luck!

## 2018-05-06 NOTE — Telephone Encounter (Signed)
Oncology Nurse Navigator Documentation  Oncology Nurse Navigator Flowsheets 05/06/2018  Navigator Location [facility]  Navigator Encounter Type Telephone/I received a vm message from patient and I called him back.  I scheduled him to be seen next week at [facility].  He verbalized understanding of appt time and place.   Telephone Incoming Call;Outgoing Call  Treatment Phase Pre-Tx/Tx Discussion  Barriers/Navigation Needs Education;Coordination of Care  Education Other  Interventions Coordination of Care;Education  Coordination of Care Appts  Education Method Verbal  Acuity Level 1  Time Spent with Patient 30

## 2018-05-06 NOTE — Telephone Encounter (Signed)
Oncology Nurse Navigator Documentation  Oncology Nurse Navigator Flowsheets 05/06/2018  Navigator Location [facility]  Referral date to RadOnc/MedOnc 05/06/2018  Navigator Encounter Type Telephone/I received referral on George Vega today.  I called to schedule him to be seen at [facility].  George Vega was not available and I will call back.   Telephone Outgoing Call  Treatment Phase Pre-Tx/Tx Discussion  Barriers/Navigation Needs Coordination of Care  Interventions Coordination of Care  Coordination of Care Other  Acuity Level 1  Time Spent with Patient 30

## 2018-05-07 ENCOUNTER — Encounter: Payer: Self-pay | Admitting: Oncology

## 2018-05-07 DIAGNOSIS — C3491 Malignant neoplasm of unspecified part of right bronchus or lung: Secondary | ICD-10-CM

## 2018-05-07 HISTORY — DX: Malignant neoplasm of unspecified part of right bronchus or lung: C34.91

## 2018-05-07 LAB — IRON AND TIBC
Iron Saturation: 45 % (ref 15–55)
Iron: 138 ug/dL (ref 38–169)
Total Iron Binding Capacity: 310 ug/dL (ref 250–450)
UIBC: 172 ug/dL (ref 111–343)

## 2018-05-07 LAB — FERRITIN: Ferritin: 90 ng/mL (ref 30–400)

## 2018-05-07 NOTE — Progress Notes (Signed)
Hematology and Oncology Follow Up Visit  George Vega [id number] March 21, 1940 78 y.o. 05/07/2018 3:38 PM   Principle Diagnosis: Encounter Diagnoses  Name Primary?  . AVM (arteriovenous malformation) of colon without hemorrhage   . Chronic anticoagulation   . Iron deficiency anemia due to chronic blood loss Yes  Clinical summary: 78 year old man I initially saw in consultation at [facility] on April 17, 2017 for further evaluation of severe iron deficiency anemia.  Please see my inpatient consult note for complete details of his past medical history. He presented with increasing dyspnea.  He has underlying hypertension, coronary artery disease, chronic atrial fibrillation on Xarelto anticoagulation.  Further evaluation revealed hemoglobin 6.9 down from 14 recorded in January 2018.  He denied any obvious acute or chronic blood loss.  He had remote peptic ulcer disease at age 33.  He was not using any aspirin or nonsteroidals.  He did admit to 2 bourbon drinks daily.  There was a strong family history of colon cancer in his father side.  The patient stated that he has been getting colonoscopies every 3 years.  Most recent study in the epic record was from April 2010.  No significant pathology at that time. He was transfused. He underwent upper endoscopy and colonoscopy on April 17, 2017.  A few nonbleeding AVMs were seen in the stomach and in the colon.  Small nonbleeding internal hemorrhoids. Additional pertinent labs: Ferritin was 10 supporting chronic GI blood loss over the last year. A 510 mg dose of IV iron was given in the hospital and he was discharged on an oral iron supplement.  In view of his age and anticipated ongoing GI blood loss, I recommended decreasing his Xarelto to 15 mg daily.  Interim History: Is here for a routine follow-up visit.  He has had no further hematochezia.  Stools are dark from ongoing oral iron supplements.  On one occasion he felt the stools were too  dark.  He stopped the iron and his stools rapidly returned to the normal color.  In view of the family history of homozygous C282Y hemochromatosis and all 3 of his daughters, he agreed to be checked and he is a heterozygote.  His wife is a homozygote.  She is also my patient and accompanies him today.  A major finding since last visit: He was evaluated by Dr. Clois Comber, pulmonologist, for right lower lobe pulmonary mass seen on CT scan.  Transcutaneous biopsy was done just a few days ago on April 29, 2018 and revealed squamous cell carcinoma.  PET scan showed uptake localized to this lesion.  Mediastinum negative.  No sign of distant spread.  He he will be evaluated soon at the cancer center. Pulmonary function tests with FEV1 71% of predicted, FVC 67% of predicted, ratio 0.76.  DLCO 63% of predicted.  Medications: reviewed  Allergies:  Allergies  Allergen Reactions  . Glucosamine-Chondroitin Anaphylaxis and Other (See Comments)    Stomach cramps, can't eat   . Tetanus Toxoid Anaphylaxis and Hives    REACTION: hives/throat swells shut   . Fish Oil Other (See Comments)    Stomach cramps, can't eat   . Ibuprofen Nausea Only and Other (See Comments)    Stomach pains   . Naproxen Diarrhea    Review of Systems: See interim history Remaining ROS negative:   Physical Exam: Blood pressure (!) 93/55, pulse 76, temperature 97.6 F (36.4 C), temperature source Oral, height 5\' 9"  (1.753 m), weight 215 lb (97.5 kg), SpO2  97 %. Wt Readings from Last 3 Encounters:  05/06/18 215 lb (97.5 kg)  04/29/18 214 lb (97.1 kg)  04/22/18 213 lb (96.6 kg)     General appearance: Well-nourished Caucasian man HENNT: Pharynx no erythema, exudate, mass, or ulcer. No thyromegaly or thyroid nodules Lymph nodes: No cervical, supraclavicular, or axillary lymphadenopathy Breasts:  Lungs: Rales one third the way up right lower lobe.  Band-Aid on upper right back status post recent needle biopsy, resonant  to percussion throughout Heart: Regular rhythm, no murmur, no gallop, no rub, no click, no edema Abdomen: Soft, nontender, normal bowel sounds, no mass, no organomegaly Extremities: No edema, no calf tenderness Musculoskeletal: no joint deformities GU:  Vascular: Carotid pulses 2+, no bruits,  symmetric Neurologic: Alert, oriented, PERRLA, , cranial nerves grossly normal, motor strength 5 over 5, reflexes 1+ symmetric at the biceps, absent symmetric at the knees., upper body coordination normal, gait normal, Skin: No rash or ecchymosis  Lab Results: CBC W/Diff    Component Value Date/Time   WBC 8.2 04/29/2018 0858   RBC 4.36 04/29/2018 0858   HGB 13.3 04/29/2018 0858   HGB 12.9 (L) 05/08/2017 0828   HCT 42.7 04/29/2018 0858   HCT 41.8 05/08/2017 0828   PLT 189 04/29/2018 0858   PLT 304 05/08/2017 0828   MCV 97.9 04/29/2018 0858   MCV 83 05/08/2017 0828   MCH 30.5 04/29/2018 0858   MCHC 31.1 04/29/2018 0858   RDW 12.6 04/29/2018 0858   RDW 13.7 03/15/2016 1039   LYMPHSABS 1.6 05/27/2017 1354   LYMPHSABS 2.1 05/08/2017 0828   MONOABS 0.5 05/27/2017 1354   EOSABS 0.6 05/27/2017 1354   EOSABS 0.6 (H) 05/08/2017 0828   BASOSABS 0.1 05/27/2017 1354   BASOSABS 0.1 05/08/2017 0828     Chemistry      Component Value Date/Time   NA 138 08/28/2017 2151   NA 140 05/08/2017 0828   K 4.3 08/28/2017 2151   CL 102 08/28/2017 2151   CO2 27 08/28/2017 2151   BUN 15 08/28/2017 2151   BUN 17 05/08/2017 0828   CREATININE 1.03 08/28/2017 2151      Component Value Date/Time   CALCIUM 9.3 08/28/2017 2151   ALKPHOS 97 04/15/2017 1636   AST 12 04/15/2017 1636   ALT 9 04/15/2017 1636   BILITOT 0.5 04/15/2017 1636    Iron 138, ferritin 90: 05/06/2018   Radiological Studies: Ct Chest Wo Contrast  Nm Pet Image Initial (pi) Skull Base To Thigh  Result Date: 04/19/2018   Ct Biopsy  Result Date: 04/29/2018 INDICATION: Posterior right lower lobe PET positive nodule EXAM: CT-GUIDED  BIOPSY RIGHT LOWER LOBE NODULE MEDICATIONS: 1% lidocaine local ANESTHESIA/SEDATION: 1.0 mg IV Versed; 25 mcg IV  Dg Chest Port 1 View     Impression:  1.  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 Hemoglobin currently stable.  Iron and ferritin levels normal.  2.  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 In my opinion he will never need to be phlebotomized.  He has ongoing GI blood loss.  He was just found to have a pulmonary malignancy.  He is 78 years old.  Most healthy heterozygotes do not need to be phlebotomized unless to have concomitant liver disease which would accentuate iron trapping.  3.  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  Oncology referral made.  Primary radiation therapy may be the best option in view of his cardiopulmonary disease.  CC: Patient Care Team: ViaLennette Bihari, MD as PCP - General (Family Medicine) Luanne Bras, MD as Consulting Physician (Interventional Radiology)   Murriel Hopper, MD, [place]  Hematology-Oncology/Internal Medicine     2/26/20203:38 PM

## 2018-05-08 ENCOUNTER — Other Ambulatory Visit: Payer: Self-pay | Admitting: *Deleted

## 2018-05-08 NOTE — Progress Notes (Signed)
The proposed treatment discussed in cancer conference 05/08/2018 is for discussion purpose only and is not a binding recommendation.  The patient was not physically examined nor present for their treatment options.  Therefore, final treatment plans cannot be decided.

## 2018-05-09 ENCOUNTER — Encounter (INDEPENDENT_AMBULATORY_CARE_PROVIDER_SITE_OTHER): Payer: Medicare Other | Admitting: Ophthalmology

## 2018-05-09 ENCOUNTER — Telehealth: Payer: Self-pay | Admitting: *Deleted

## 2018-05-09 DIAGNOSIS — I10 Essential (primary) hypertension: Secondary | ICD-10-CM

## 2018-05-09 DIAGNOSIS — H35033 Hypertensive retinopathy, bilateral: Secondary | ICD-10-CM

## 2018-05-09 DIAGNOSIS — H353122 Nonexudative age-related macular degeneration, left eye, intermediate dry stage: Secondary | ICD-10-CM

## 2018-05-09 DIAGNOSIS — D3131 Benign neoplasm of right choroid: Secondary | ICD-10-CM

## 2018-05-09 DIAGNOSIS — H43813 Vitreous degeneration, bilateral: Secondary | ICD-10-CM

## 2018-05-09 DIAGNOSIS — H353211 Exudative age-related macular degeneration, right eye, with active choroidal neovascularization: Secondary | ICD-10-CM | POA: Diagnosis not present

## 2018-05-09 NOTE — Telephone Encounter (Signed)
-----   Message from Annia Belt, MD sent at 05/07/2018  1:52 PM EST ----- Call pt: iron levels are back to normal!

## 2018-05-09 NOTE — Telephone Encounter (Signed)
Called pt/ wife - when giving pt's wife her results also informed her of his which "iron levels are back to normal" per Dr Beryle Beams, she will relay to Mr Brookens.

## 2018-05-15 ENCOUNTER — Encounter: Payer: Self-pay | Admitting: *Deleted

## 2018-05-15 ENCOUNTER — Other Ambulatory Visit: Payer: Self-pay | Admitting: *Deleted

## 2018-05-15 ENCOUNTER — Inpatient Hospital Stay: Payer: Medicare Other | Attending: Family Medicine

## 2018-05-15 ENCOUNTER — Encounter: Payer: Self-pay | Admitting: Internal Medicine

## 2018-05-15 ENCOUNTER — Ambulatory Visit
Admission: RE | Admit: 2018-05-15 | Discharge: 2018-05-15 | Disposition: A | Discharge status: Home / Self Care | Payer: Medicare Other | Source: Ambulatory Visit | Attending: Radiation Oncology | Admitting: Radiation Oncology

## 2018-05-15 ENCOUNTER — Inpatient Hospital Stay (HOSPITAL_BASED_OUTPATIENT_CLINIC_OR_DEPARTMENT_OTHER): Payer: Medicare Other | Admitting: Internal Medicine

## 2018-05-15 ENCOUNTER — Institutional Professional Consult (permissible substitution) (INDEPENDENT_AMBULATORY_CARE_PROVIDER_SITE_OTHER): Payer: Medicare Other | Admitting: Cardiothoracic Surgery

## 2018-05-15 VITALS — BP 114/76 | HR 78 | Temp 98.3°F | Resp 18 | Wt 215.8 lb

## 2018-05-15 VITALS — BP 114/76 | HR 78 | Temp 98.3°F | Resp 18 | Ht 69.0 in | Wt 215.8 lb

## 2018-05-15 DIAGNOSIS — Z79899 Other long term (current) drug therapy: Secondary | ICD-10-CM | POA: Insufficient documentation

## 2018-05-15 DIAGNOSIS — I2510 Atherosclerotic heart disease of native coronary artery without angina pectoris: Secondary | ICD-10-CM

## 2018-05-15 DIAGNOSIS — Z7901 Long term (current) use of anticoagulants: Secondary | ICD-10-CM | POA: Insufficient documentation

## 2018-05-15 DIAGNOSIS — N402 Nodular prostate without lower urinary tract symptoms: Secondary | ICD-10-CM | POA: Insufficient documentation

## 2018-05-15 DIAGNOSIS — C3431 Malignant neoplasm of lower lobe, right bronchus or lung: Secondary | ICD-10-CM | POA: Insufficient documentation

## 2018-05-15 DIAGNOSIS — I4819 Other persistent atrial fibrillation: Secondary | ICD-10-CM | POA: Diagnosis not present

## 2018-05-15 DIAGNOSIS — I10 Essential (primary) hypertension: Secondary | ICD-10-CM

## 2018-05-15 DIAGNOSIS — R918 Other nonspecific abnormal finding of lung field: Secondary | ICD-10-CM

## 2018-05-15 DIAGNOSIS — F329 Major depressive disorder, single episode, unspecified: Secondary | ICD-10-CM | POA: Insufficient documentation

## 2018-05-15 DIAGNOSIS — K589 Irritable bowel syndrome without diarrhea: Secondary | ICD-10-CM | POA: Diagnosis not present

## 2018-05-15 DIAGNOSIS — C3491 Malignant neoplasm of unspecified part of right bronchus or lung: Secondary | ICD-10-CM

## 2018-05-15 DIAGNOSIS — Z87891 Personal history of nicotine dependence: Secondary | ICD-10-CM | POA: Diagnosis not present

## 2018-05-15 DIAGNOSIS — M1990 Unspecified osteoarthritis, unspecified site: Secondary | ICD-10-CM | POA: Insufficient documentation

## 2018-05-15 LAB — CMP (CANCER CENTER ONLY)
ALT: 28 U/L (ref 0–44)
AST: 27 U/L (ref 15–41)
Albumin: 3.5 g/dL (ref 3.5–5.0)
Alkaline Phosphatase: 137 U/L — ABNORMAL HIGH (ref 38–126)
Anion gap: 8 (ref 5–15)
BUN: 15 mg/dL (ref 8–23)
CO2: 26 mmol/L (ref 22–32)
Calcium: 8.6 mg/dL — ABNORMAL LOW (ref 8.9–10.3)
Chloride: 106 mmol/L (ref 98–111)
Creatinine: 0.81 mg/dL (ref 0.61–1.24)
GFR, Est AFR Am: >60 mL/min (ref >60)
GFR, Estimated: >60 mL/min (ref >60)
Glucose, Bld: 127 mg/dL — ABNORMAL HIGH (ref 70–99)
Potassium: 3.7 mmol/L (ref 3.5–5.1)
SODIUM: 140 mmol/L (ref 135–145)
Total Bilirubin: 0.7 mg/dL (ref 0.3–1.2)
Total Protein: 6.5 g/dL (ref 6.5–8.1)

## 2018-05-15 LAB — CBC WITH DIFFERENTIAL (CANCER CENTER ONLY)
Abs Immature Granulocytes: 0.03 10*3/uL (ref 0.00–0.07)
Basophils Absolute: 0.1 10*3/uL (ref 0.0–0.1)
Basophils Relative: 1 %
Eosinophils Absolute: 0.4 10*3/uL (ref 0.0–0.5)
Eosinophils Relative: 5 %
HEMATOCRIT: 42.7 % (ref 39.0–52.0)
Hemoglobin: 13.8 g/dL (ref 13.0–17.0)
Immature Granulocytes: 0 %
Lymphocytes Relative: 15 %
Lymphs Abs: 1.3 10*3/uL (ref 0.7–4.0)
MCH: 31.6 pg (ref 26.0–34.0)
MCHC: 32.3 g/dL (ref 30.0–36.0)
MCV: 97.7 fL (ref 80.0–100.0)
MONO ABS: 0.7 10*3/uL (ref 0.1–1.0)
Monocytes Relative: 8 %
Neutro Abs: 5.7 10*3/uL (ref 1.7–7.7)
Neutrophils Relative %: 71 %
Platelet Count: 196 10*3/uL (ref 150–400)
RBC: 4.37 MIL/uL (ref 4.22–5.81)
RDW: 12.9 % (ref 11.5–15.5)
WBC Count: 8.2 10*3/uL (ref 4.0–10.5)
nRBC: 0 % (ref 0.0–0.2)

## 2018-05-15 NOTE — Progress Notes (Signed)
The proposed treatment discussed in cancer conference 05/15/2018 is for discussion purpose only and is not a binding recommendation.  The patient was not physically examined nor present for their treatment options.  Therefore, final treatment plans cannot be decided.

## 2018-05-15 NOTE — Progress Notes (Signed)
[address]       [place] [address]             [phone]                    George Vega [facility] Medical Record #[id number] Date of Birth: [birth year]  Referring: Tanda Rockers, MD Primary Care: Via, Lennette Bihari, MD Primary Cardiologist: Cristopher Peru, MD  Chief Complaint:   Lung cancer  History of Present Illness:    George Vega 78 y.o. 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  The patient had noticed increasing shortness of breath and struggling to breathe" he was referred to Dr. Melvyn Novas CT scan of chest was done and a pleural-based right lower lobe lung lesion 1.4 x 2.9 cm and had increased in size from 0.9 x 2.1 cm since February 2019 .  A PET scan was performed on 04/18/2018 which showed a hypermetabolic 2.8 cm posterior lower lobe pulmonary nodule pleural-based was appreciated .  Recent CT needle biopsy of this area confirm squamous cell carcinoma.  April 29, 2018 the patient has a previous history of atrial fibrillation and A. fib ablation has been followed by Crissie Sickles currently on Xarelto 4 years ago he had cardiac catheterization that did not reveal any significant coronary disease  ,He has a history of hypertension atrial fibrillation, osteoarthritis, he had a stroke 7 years ago but without significant sequelae.  He has been followed by Dr. Beryle Beams for history of hemochromatosis.    Family history is significant for his mother with colon cancer father had heart disease, one brother had salivary gland malignancy.  Patient is married has 3 daughters.  He works for [employer] for many years.  Denies definite asbestos exposure.  He did smoke up to 2 packs a day for 35 years but quit approximately 20 years ago.  Diagnosis [id number] Lung, needle/core biopsy(ies), right lower lobe - SQUAMOUS CELL CARCINOMA. Microscopic Comment Results reported  to Dr. Christinia Gully on 04/30/2018. Intradepartmental consultation was obtained (Dr. Tresa Moore). [name] MD  Current Activity/ Functional Status:  Patient is independent with mobility/ambulation, transfers, ADL's, IADL's.   Zubrod Score: At the time of surgery this patient's most appropriate activity status/level should be described as: []     0    Normal activity, no symptoms [x]     1    Restricted in physical strenuous activity but ambulatory, able to do out light work []     2    Ambulatory and capable of self care, unable to do work activities, up and about               >50 % of waking hours                              []     3    Only limited self care, in bed greater than 50% of waking hours []     4    Completely disabled, no self care, confined to bed or chair []     5    Moribund   Past Medical History:  Diagnosis Date  . Arthritis   . Atrial fibrillation, persistent   . Chronic sinusitis   . Coronary artery disease   . Depression   . Difficult intubation  was told with shoulder done 2006-alittle narrow  . 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 Hypertension   . Jejunostomy tube fell out    when asked about this in 04/2017, pt denied ever having a J tube, feeding tube, tubes placed post surgery so ??? veracity of a previous J tube.    . 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 Spastic colon   . Squamous cell carcinoma of lung, stage I, right ([facility]) 05/07/2018   bx 04/29/18; isolated PET uptake in RLL mass  . Stroke (cerebrum) ([facility])   . SVT (supraventricular tachycardia) ([facility])     Past Surgical History:  Procedure Laterality Date  . COLONOSCOPY WITH PROPOFOL N/A 04/17/2017   Procedure: COLONOSCOPY WITH PROPOFOL;  Surgeon: Yetta Flock, MD;  Location: [facility];  Service: Gastroenterology;  Laterality: N/A;  . ESOPHAGEAL DILATION  2017  . ESOPHAGOGASTRODUODENOSCOPY (EGD) WITH PROPOFOL N/A 04/17/2017   Procedure: ESOPHAGOGASTRODUODENOSCOPY (EGD)  WITH PROPOFOL;  Surgeon: Yetta Flock, MD;  Location: [facility];  Service: Gastroenterology;  Laterality: N/A;  . FOOT NEUROMA SURGERY  2002  . LEFT HEART CATHETERIZATION WITH CORONARY ANGIOGRAM Right 06/14/2011   20% LM, chronic occluded mid LAD, 50% ostial LCX, 20% mid RI, RCA with collaterals to mid LAD, mid 10% stenosis, EF 60% 06/14/11  . NASAL SINUS SURGERY    . SHOULDER ARTHROSCOPY  03/01/2011   Procedure: ARTHROSCOPY SHOULDER;  Surgeon: Ninetta Lights, MD;  Location: [facility];  Service: Orthopedics;  Laterality: Right;  arthroscopy shoulder decompression subacromial partial acromioplasty with coracoacromial release, distal claviculectomy, debridement of labrium  . SHOULDER ARTHROSCOPY  03/01/2011   Procedure: ARTHROSCOPY SHOULDER;  Surgeon: Ninetta Lights, MD;  Location: [facility];  Service: Orthopedics;  Laterality: Right;  arthroscopy shoulder decompression subacromial partial acromioplasty with coracoacromial release, distal claviculectomy, debridement of labrium  . TEE WITHOUT CARDIOVERSION N/A 03/19/2016   Procedure: TRANSESOPHAGEAL ECHOCARDIOGRAM (TEE);  Surgeon: Larey Dresser, MD;  Location: [facility];  Service: Cardiovascular;  Laterality: N/A;  . TOTAL KNEE ARTHROPLASTY Right 09/23/2013   Procedure: TOTAL KNEE ARTHROPLASTY;  Surgeon: Ninetta Lights, MD;  Location: [facility];  Service: Orthopedics;  Laterality: Right;  . TOTAL SHOULDER ARTHROPLASTY  06/28/2011   Procedure: TOTAL SHOULDER ARTHROPLASTY;  Surgeon: Ninetta Lights, MD;  Location: [facility];  Service: Orthopedics;  Laterality: Right;  . UVULOPALATOPHARYNGOPLASTY      Family History  Problem Relation Age of Onset  . Cancer Mother        colon  . Heart attack Father   . Migraines Brother      Social History   Tobacco Use  Smoking Status Former Smoker  . Packs/day: 2.00  . Years: 30.00  . Pack years: 60.00  . Types: Cigarettes  . Last attempt to quit:  02/27/1998  . Years since quitting: 20.2  Smokeless Tobacco Never Used    Social History   Substance and Sexual Activity  Alcohol Use Yes  . Alcohol/week: 14.0 standard drinks  . Types: 14 Standard drinks or equivalent per week   Comment: daily     Allergies  Allergen Reactions  . Glucosamine-Chondroitin Anaphylaxis and Other (See Comments)    Stomach cramps, can't eat   . Tetanus Toxoid Anaphylaxis and Hives    REACTION: hives/throat swells shut   . Fish Oil Other (See Comments)    Stomach cramps, can't eat   . Ibuprofen Nausea Only and Other (See Comments)  Stomach pains   . Naproxen Diarrhea    Current Outpatient Medications  Medication Sig Dispense Refill  . acetaminophen (TYLENOL) 325 MG tablet Take 650 mg by mouth every 6 (six) hours as needed for moderate pain.    Marland Kitchen amLODipine (NORVASC) 5 MG tablet TAKE 1 TABLET BY MOUTH  DAILY (Patient taking differently: Take 5 mg by mouth daily. ) 90 tablet 3  . Cholecalciferol (VITAMIN D3) 5000 units TABS Take 5,000 Units by mouth daily.    . Cyanocobalamin (VITAMIN B-12) 500 MCG SUBL Place 500 mcg under the tongue 3 (three) times a week.     . dofetilide (TIKOSYN) 250 MCG capsule TAKE 1 CAPSULE BY MOUTH TWO TIMES DAILY (Patient taking differently: Take 250 mcg by mouth 2 (two) times daily. ) 180 capsule 2  . FEROSUL 325 (65 Fe) MG tablet TAKE 1 TABLET BY MOUTH TWICE DAILY (Patient taking differently: Take 325 mg by mouth 2 (two) times daily with a meal. ) 180 tablet 1  . furosemide (LASIX) 40 MG tablet TAKE 1 TABLET(40 MG) BY MOUTH DAILY (Patient taking differently: Take 40 mg by mouth daily. ) 90 tablet 2  . irbesartan (AVAPRO) 150 MG tablet TAKE 1 TABLET(150 MG) BY MOUTH DAILY (Patient taking differently: Take 150 mg by mouth daily. TAKE 1 TABLET(150 MG) BY MOUTH DAILY) 90 tablet 3  . magnesium oxide (MAG-OX) 400 MG tablet Take 400 mg by mouth daily.    . Melatonin 10 MG TABS Take 10 mg by mouth at bedtime as needed (sleep).      . metoprolol tartrate (LOPRESSOR) 25 MG tablet TAKE 1 TABLET BY MOUTH TWO  TIMES DAILY (Patient taking differently: Take 25 mg by mouth 2 (two) times daily. ) 180 tablet 3  . Multiple Vitamins-Minerals (PRESERVISION AREDS 2 PO) Take 1 capsule by mouth 2 (two) times daily.    . pantoprazole (PROTONIX) 40 MG tablet Take 40 mg by mouth daily.     Marland Kitchen PARoxetine (PAXIL) 30 MG tablet Take 30 mg by mouth at bedtime.     . polycarbophil (FIBERCON) 625 MG tablet Take 1,250 mg by mouth at bedtime.     [name] Glycol-Propyl Glycol (SYSTANE OP) Place 2 drops into both eyes 2 (two) times daily as needed (dry eyes).     . potassium chloride (K-DUR) 10 MEQ tablet Take 1 tablet (10 mEq total) by mouth daily. 90 tablet 3  . rosuvastatin (CRESTOR) 5 MG tablet TAKE 1 TABLET BY MOUTH  EVERY MORNING (Patient taking differently: Take 5 mg by mouth daily. ) 90 tablet 3  . tamsulosin (FLOMAX) 0.4 MG CAPS capsule Take 0.4 mg by mouth daily.  11  . XARELTO 15 MG TABS tablet TAKE 1 TABLET BY MOUTH  DAILY WITH SUPPER (Patient taking differently: Take 15 mg by mouth daily with supper. ) 90 tablet 1   No current facility-administered medications for this visit.     Pertinent items are noted in HPI.   Review of Systems:     Cardiac Review of Systems: [Y] = yes  or   [ N ] = no   Chest Pain [  n  ]  Resting SOB [  n ] Exertional SOB  [ y ]  Orthopnea [ n ]   Pedal Edema [n   ]    Palpitations Blue.Reese  ] Syncope  [ n ]   Presyncope [n   ]   General Review of Systems: [Y] = yes [  ]=no Constitional: recent  weight change [  ];  Wt loss over the last 3 months [   ] anorexia [  ]; fatigue [  ]; nausea [  ]; night sweats [  ]; fever [  ]; or chills [  ];           Eye : blurred vision [  ]; diplopia [   ]; vision changes [  ];  Amaurosis fugax[  ]; Resp: cough [  ];  wheezing[  ];  hemoptysis[  ]; shortness of breath[  ]; paroxysmal nocturnal dyspnea[  ]; dyspnea on exertion[ y ]; or orthopnea[  ];  GI:  gallstones[  ],  vomiting[  ];  dysphagia[  ]; melena[  ];  hematochezia [  ]; heartburn[  ];   Hx of  Colonoscopy[  ]; GU: kidney stones [  ]; hematuria[  ];   dysuria [  ];  nocturia[  ];  history of     obstruction [  ]; urinary frequency [  ]             Skin: rash, swelling[  ];, hair loss[  ];  peripheral edema[  ];  or itching[  ]; Musculosketetal: myalgias[  ];  joint swelling[  ];  joint erythema[  ];  joint pain[  ];  back pain[  ];  Heme/Lymph: bruising[  ];  bleeding[  ];  anemia[  ];  Neuro: TIA[  ];  headaches[  ];  stroke[  ];  vertigo[  ];  seizures[  ];   paresthesias[  ];  difficulty walking[  ];  Psych:depression[  ]; anxiety[  ];  Endocrine: diabetes[  ];  thyroid dysfunction[  ];  Immunizations: Flu up to date [  ]; Pneumococcal up to date [  ];  Other:     PHYSICAL EXAMINATION: BP 114/76   Pulse 78   Temp 98.3 F (36.8 C)   Resp 18   Wt 215 lb 12.8 oz (97.9 kg)   SpO2 94%   BMI 31.87 kg/m  General appearance: alert, cooperative, appears stated age and no distress Head: Normocephalic, without obvious abnormality, atraumatic Neck: no adenopathy, no carotid bruit, no JVD, supple, symmetrical, trachea midline and thyroid not enlarged, symmetric, no tenderness/mass/nodules Lymph nodes: Cervical, supraclavicular, and axillary nodes normal. Resp: clear to auscultation bilaterally Back: symmetric, no curvature. ROM normal. No CVA tenderness. Cardio: regular rate and rhythm, S1, S2 normal, no murmur, click, rub or gallop GI: soft, non-tender; bowel sounds normal; no masses,  no organomegaly Extremities: extremities normal, atraumatic, no cyanosis or edema Neurologic: Grossly normal  Diagnostic Studies & Laboratory data:     Recent Radiology Findings:   Nm Pet Image Initial (pi) Skull Base To Thigh  Result Date: 04/19/2018 CLINICAL DATA:  Initial treatment strategy for enlarging right lower lobe pulmonary nodule. EXAM: NUCLEAR MEDICINE PET SKULL BASE TO THIGH TECHNIQUE: 10.5 mCi  F-18 FDG was injected intravenously. Full-ring PET imaging was performed from the skull base to thigh after the radiotracer. CT data was obtained and used for attenuation correction and anatomic localization. Fasting blood glucose: 109 mg/dl COMPARISON:  04/09/2018 chest CT. FINDINGS: Mediastinal blood pool activity: SUV max 3.1 NECK: No hypermetabolic lymph nodes in the neck. Incidental CT findings: none CHEST: Hypermetabolic 2.8 x 1.4 cm posterior right lower lobe pulmonary nodule with max SUV 10.1 (series 8/image 34). Sub solid 1.3 cm peripheral left upper lobe pulmonary nodule with low level metabolism with max SUV 2.2 (series 8/image 32), new since  04/09/2018 chest CT. No additional hypermetabolic pulmonary findings. No hypermetabolic axillary, mediastinal or hilar lymph nodes. Incidental CT findings: Coronary atherosclerosis. Atherosclerotic nonaneurysmal thoracic aorta. Mild-to-moderate centrilobular and paraseptal emphysema with diffuse bronchial wall thickening. Patchy subpleural reticulation and ground-glass attenuation throughout both lungs, unchanged. ABDOMEN/PELVIS: No abnormal hypermetabolic activity within the liver, pancreas, adrenal glands, or spleen. No hypermetabolic lymph nodes in the abdomen or pelvis. Intense focal hypermetabolism in the right prostate with max SUV 13.0. Incidental CT findings: Mildly irregular liver surface, unchanged, suggesting up attic cirrhosis. Cholelithiasis. Nonobstructing 4 mm upper left renal stone. Exophytic hyperdense subcentimeter renal cortical lesion in the lower right kidney, which requires no follow-up. Atherosclerotic nonaneurysmal abdominal aorta. Marked sigmoid diverticulosis. SKELETON: No focal hypermetabolic activity to suggest skeletal metastasis. Incidental CT findings: Bilateral total shoulder arthroplasty. IMPRESSION: 1. Hypermetabolic 2.8 cm posterior right lower lobe pulmonary nodule, compatible with primary bronchogenic carcinoma. 2. No  hypermetabolic thoracic adenopathy or distant metastatic disease. PET-CT stage IA (T1c N0 M0). 3. Intense focal hypermetabolism in the right prostate, can not exclude prostate malignancy. Recommend correlation with serum PSA and urology consultation. 4. Subsolid 1.3 cm peripheral left upper lobe pulmonary nodule with low level metabolism, new since recent chest CT dated 04/09/2018, more likely inflammatory. Recommend attention on follow-up chest CT in 3 months. 5. Morphologic changes of hepatic cirrhosis. 6. Cholelithiasis. 7. Marked sigmoid diverticulosis. 8. Nonobstructing left nephrolithiasis. 9. Aortic Atherosclerosis (ICD10-I70.0) and Emphysema (ICD10-J43.9). Electronically Signed   By: Ilona Sorrel M.D.   On: 04/19/2018 00:43   Ct Biopsy  Result Date: 04/29/2018 INDICATION: Posterior right lower lobe PET positive nodule EXAM: CT-GUIDED BIOPSY RIGHT LOWER LOBE NODULE MEDICATIONS: 1% lidocaine local ANESTHESIA/SEDATION: 1.0 mg IV Versed; 25 mcg IV Fentanyl Moderate Sedation Time:  11 minutes The patient was continuously monitored during the procedure by the interventional radiology nurse under my direct supervision. PROCEDURE: The procedure, risks, benefits, and alternatives were explained to the patient. Questions regarding the procedure were encouraged and answered. The patient understands and consents to the procedure. Previous imaging reviewed. Patient positioned right side down decubitus for a posterior approach. Noncontrast localization CT performed. The posterior subpleural right lower lobe nodule was localized and correlated with the PET-CT. Overlying skin marked. Under sterile conditions and local anesthesia, a 17 gauge coaxial guide needle was advanced to the lesion under CT guidance. Needle position confirmed with CT. 1 cm 18 gauge core biopsies obtained. Samples were intact and non fragmented. These were placed in formalin. Needle tract occluded with the bio sentry device. Postprocedure imaging  demonstrates a small amount of surrounding alveolar hemorrhage from the biopsy. No pneumothorax. No effusion. Patient tolerated the procedure well without complication. Vital sign monitoring by nursing staff during the procedure will continue as patient is in the special procedures unit for post procedure observation. FINDINGS: The images document guide needle placement within the posterior right lower lobe lesion. Post biopsy images demonstrate small amount of surrounding hemorrhage. COMPLICATIONS: None immediate. IMPRESSION: Successful CT-guided core biopsy of the right lower lobe nodule Electronically Signed   By: Jerilynn Mages.  Shick M.D.   On: 04/29/2018 12:33   Dg Chest Port 1 View  Result Date: 04/29/2018 CLINICAL DATA:  Status post biopsy of right lower lobe lung nodule. EXAM: PORTABLE CHEST 1 VIEW COMPARISON:  Imaging during CT-guided lung biopsy earlier today. FINDINGS: The heart size is normal. No pneumothorax following lung biopsy. Lungs show evidence of chronic disease. There is no evidence of pulmonary edema, consolidation or pleural fluid. IMPRESSION: No pneumothorax or other  acute findings following lung biopsy. Electronically Signed   By: Aletta Edouard M.D.   On: 04/29/2018 13:57     I have independently reviewed the above radiology studies  and reviewed the findings with the patient.   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  Prior to scheduling surgery we will ask Dr. Lovena Le for cardiac clearance and plan for transition off anticoagulation for atrial fib during the perioperative period.  The patient will have a full set of pulmonary function studies and return to see me in the thoracic surgery office to discuss further plans.   I  spent 60 minutes with  the patient face to face and greater then 50% of the time was spent in counseling and coordination of care.    [name] MD      [address] [place] [address] Office [phone]   Beeper [phone]  05/20/2018 9:10 PM

## 2018-05-15 NOTE — Progress Notes (Signed)
[name] Clinical Social Work  Clinical Social Work met with patient/family at [employer] appointment to offer support and assess for psychosocial needs.  Patient was accompanied by his spouse. George Vega has no concerns at this time, shared he has been very pleased with this multidisciplinary approach.   Clinical Social Work briefly discussed Clinical Social Work role and [employer] support programs/services.  Clinical Social Work encouraged patient to call with any additional questions or concerns.   [name], MSW, LCSW, OSW-C Clinical Social Worker [facility] [phone]

## 2018-05-15 NOTE — Progress Notes (Signed)
Radiation Oncology         [phone] ________________________________  Multidisciplinary Thoracic Oncology Clinic [facility]) Initial Outpatient Consultation  Name: George Vega MRN: [id number]  Date: 05/15/2018  DOB: 11-24-1940  CC:Via, Lennette Bihari, MD  Tanda Rockers, MD   REFERRING PHYSICIAN: Tanda Rockers, MD  DIAGNOSIS: The encounter diagnosis was Squamous cell carcinoma of lung, stage I, right ([place]).    ICD-10-CM   1. Squamous cell carcinoma of lung, stage I, right ([place]) C34.91     HISTORY OF PRESENT ILLNESS::George Vega is a 78 y.o. male who has a medical history significant for atrial fibrillation, followed by Dr. Lovena Le in cardiology. He presented to his PCP for annual exam on 09/25/2016. His PCP ordered lung cancer screening chest CT scan on 10/03/2016 due to his smoking history with results showing bilateral pulmonary nodules. He followed up with Dr. Melvyn Novas on 10/09/2016 who recommended 6 month follow up chest CT. This scan occurred on 04/09/2017 and showed stable nodules. He was hospitalized on 08/28/2017 for atrial fibrillation, with chest x-ray performed at that time which was stable.  He underwent follow up chest CT scan on 04/09/2018 under Dr. Melvyn Novas. This revealed: subpleural right lower lobe nodule increased in size and worrisome for adenocarcinoma. He proceeded to PET scan on 04/18/2018 with results revealing: hypermetabolic 2.8 cm posterior right lower lobe pulmonary nodule, compatible with primary bronchogenic carcinoma; no hypermetabolic thoracic adenopathy or distant metastatic disease, PET-CT stage IA (T1c N0 M0); focal hypermetabolism in the right prostate, can not exclude prostate malignancy; subsolid 1.3 cm peripheral left upper lobe pulmonary nodule with low level metabolism, new since recent chest CT dated 04/09/2018, more likely inflammatory.  He proceeded to right lower lobe biopsy on 04/29/2018 with pathology results revealing squamous cell carcinoma.  The patient was  referred today for presentation in the multidisciplinary conference.  Radiology studies and pathology slides were presented there for review and discussion of treatment options.  A consensus was discussed regarding potential next steps.   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    PAST SURGICAL HISTORY: Past Surgical History:  Procedure Laterality Date  . COLONOSCOPY WITH PROPOFOL N/A 04/17/2017   Procedure: COLONOSCOPY WITH PROPOFOL;  Surgeon: Yetta Flock, MD;  Location: [place];  Service: Gastroenterology;  Laterality: N/A;  . ESOPHAGEAL DILATION  2017  . ESOPHAGOGASTRODUODENOSCOPY (EGD) WITH PROPOFOL N/A 04/17/2017   Procedure: ESOPHAGOGASTRODUODENOSCOPY (EGD) WITH PROPOFOL;  Surgeon: Yetta Flock, MD;  Location: [place];  Service: Gastroenterology;  Laterality: N/A;  . FOOT NEUROMA SURGERY  2002  . LEFT HEART CATHETERIZATION WITH CORONARY ANGIOGRAM Right 06/14/2011   20% LM, chronic occluded mid LAD, 50% ostial LCX, 20% mid RI, RCA with collaterals to mid LAD, mid 10% stenosis, EF 60% 06/14/11  . NASAL SINUS SURGERY    . SHOULDER ARTHROSCOPY  03/01/2011   Procedure: ARTHROSCOPY SHOULDER;  Surgeon: Ninetta Lights, MD;  Location: [place];  Service: Orthopedics;  Laterality: Right;  arthroscopy shoulder decompression subacromial partial acromioplasty with coracoacromial release, distal claviculectomy, debridement of labrium  . SHOULDER ARTHROSCOPY  03/01/2011   Procedure: ARTHROSCOPY SHOULDER;  Surgeon: Ninetta Lights,  MD;  Location:  [place];  Service: Orthopedics;  Laterality: Right;  arthroscopy shoulder decompression subacromial partial acromioplasty with coracoacromial release, distal claviculectomy, debridement of labrium  . TEE WITHOUT CARDIOVERSION N/A 03/19/2016   Procedure: TRANSESOPHAGEAL ECHOCARDIOGRAM (TEE);  Surgeon: Larey Dresser, MD;  Location: [place];  Service: Cardiovascular;  Laterality: N/A;  . TOTAL KNEE ARTHROPLASTY Right 09/23/2013   Procedure: TOTAL KNEE ARTHROPLASTY;  Surgeon: Ninetta Lights, MD;  Location: [place];  Service: Orthopedics;  Laterality: Right;  . TOTAL SHOULDER ARTHROPLASTY  06/28/2011   Procedure: TOTAL SHOULDER ARTHROPLASTY;  Surgeon: Ninetta Lights, MD;  Location: [place];  Service: Orthopedics;  Laterality: Right;  . UVULOPALATOPHARYNGOPLASTY      FAMILY HISTORY: family history includes Cancer in his mother; Heart attack in his father; Migraines in his brother.  SOCIAL HISTORY:  reports that he quit smoking about 20 years ago. His smoking use included cigarettes. He has a 60.00 pack-year smoking history. He has never used smokeless tobacco. He reports current alcohol use of about 14.0 standard drinks of alcohol per week. He reports that he does not use drugs.  ALLERGIES: Glucosamine-chondroitin; Tetanus toxoid; Fish oil; Ibuprofen; and Naproxen  MEDICATIONS:  Current Outpatient Medications  Medication Sig Dispense Refill  . acetaminophen (TYLENOL) 325 MG tablet Take 650 mg by mouth every 6 (six) hours as needed for moderate pain.    Marland Kitchen amLODipine (NORVASC) 5 MG tablet TAKE 1 TABLET BY MOUTH  DAILY (Patient taking differently: Take 5 mg by mouth daily. ) 90 tablet 3  . Cholecalciferol (VITAMIN D3) 5000 units TABS Take 5,000 Units by mouth daily.    . Cyanocobalamin (VITAMIN B-12) 500 MCG SUBL Place 500 mcg under the tongue 3 (three) times a week.     . dofetilide (TIKOSYN) 250 MCG capsule TAKE 1 CAPSULE BY MOUTH TWO TIMES DAILY  (Patient taking differently: Take 250 mcg by mouth 2 (two) times daily. ) 180 capsule 2  . FEROSUL 325 (65 Fe) MG tablet TAKE 1 TABLET BY MOUTH TWICE DAILY (Patient taking differently: Take 325 mg by mouth 2 (two) times daily with a meal. ) 180 tablet 1  . furosemide (LASIX) 40 MG tablet TAKE 1 TABLET(40 MG) BY MOUTH DAILY (Patient taking differently: Take 40 mg by mouth daily. ) 90 tablet 2  . irbesartan (AVAPRO) 150 MG tablet TAKE 1 TABLET(150 MG) BY MOUTH DAILY (Patient taking differently: Take 150 mg by mouth daily. TAKE 1 TABLET(150 MG) BY MOUTH DAILY) 90 tablet 3  . magnesium oxide (MAG-OX) 400 MG tablet Take 400 mg by mouth daily.    . Melatonin 10 MG TABS Take 10 mg by mouth at bedtime as needed (sleep).     . metoprolol tartrate (LOPRESSOR) 25 MG tablet TAKE 1 TABLET BY MOUTH TWO  TIMES DAILY (Patient taking differently: Take 25 mg by mouth 2 (two) times daily. ) 180 tablet 3  . Multiple Vitamins-Minerals (PRESERVISION AREDS 2 PO) Take 1 capsule by mouth 2 (two) times daily.    . pantoprazole (PROTONIX) 40 MG tablet Take 40 mg by mouth daily.     Marland Kitchen PARoxetine (PAXIL) 30 MG tablet Take 30 mg by mouth at bedtime.     . polycarbophil (FIBERCON) 625 MG tablet Take 1,250 mg by mouth at bedtime.     [name] Glycol-Propyl Glycol (SYSTANE OP) Place 2 drops into both eyes 2 (two) times daily as needed (dry eyes).     . potassium chloride (K-DUR) 10 MEQ tablet Take 1 tablet (  10 mEq total) by mouth daily. 90 tablet 3  . rosuvastatin (CRESTOR) 5 MG tablet TAKE 1 TABLET BY MOUTH  EVERY MORNING (Patient taking differently: Take 5 mg by mouth daily. ) 90 tablet 3  . tamsulosin (FLOMAX) 0.4 MG CAPS capsule Take 0.4 mg by mouth daily.  11  . XARELTO 15 MG TABS tablet TAKE 1 TABLET BY MOUTH  DAILY WITH SUPPER (Patient taking differently: Take 15 mg by mouth daily with supper. ) 90 tablet 1   No current facility-administered medications for this encounter.     REVIEW OF SYSTEMS:  REVIEW OF SYSTEMS: A  10+ POINT REVIEW OF SYSTEMS WAS OBTAINED including neurology, dermatology, psychiatry, cardiac, respiratory, lymph, extremities, GI, GU, musculoskeletal, constitutional, reproductive, HEENT. All pertinent positives are noted in the HPI. All others are negative.    PHYSICAL EXAM: Vitals with BMI 05/15/2018  Height 5\' 9"   Weight 215 lbs 13 oz  BMI 69.48  Systolic 546  Diastolic 76  Pulse 78  Respirations 18  General: Alert and oriented, in no acute distress HEENT: Head is normocephalic. Extraocular movements are intact. Oropharynx is clear. Neck: Neck is supple, no palpable cervical or supraclavicular lymphadenopathy. Heart: Regular in rate and rhythm with no murmurs, rubs, or gallops. Chest: Clear to auscultation bilaterally, with no rhonchi, wheezes, or rales. Abdomen: Soft, nontender, nondistended, with no rigidity or guarding. Extremities: No cyanosis or edema. Lymphatics: see Neck Exam Skin: No concerning lesions. Musculoskeletal: symmetric strength and muscle tone throughout. Neurologic: Cranial nerves II through XII are grossly intact. No obvious focalities. Speech is fluent. Coordination is intact. Psychiatric: Judgment and insight are intact. Affect is appropriate.   KPS = 100  100 - Normal; no complaints; no evidence of disease. 90   - Able to carry on normal activity; minor signs or symptoms of disease. 80   - Normal activity with effort; some signs or symptoms of disease. 56   - Cares for self; unable to carry on normal activity or to do active work. 60   - Requires occasional assistance, but is able to care for most of his personal needs. 50   - Requires considerable assistance and frequent medical care. 2   - Disabled; requires special care and assistance. 49   - Severely disabled; hospital admission is indicated although death not imminent. 3   - Very sick; hospital admission necessary; active supportive treatment necessary. 10   - Moribund; fatal processes  progressing rapidly. 0     - Dead  Karnofsky DA, Abelmann [place], Craver LS and Burchenal [facility] [phone]) The use of the nitrogen mustards in the palliative treatment of carcinoma: with particular reference to bronchogenic carcinoma Cancer 1 [id number]  LABORATORY DATA:  Lab Results  Component Value Date   WBC 8.2 05/15/2018   HGB 13.8 05/15/2018   HCT 42.7 05/15/2018   MCV 97.7 05/15/2018   PLT 196 05/15/2018   Lab Results  Component Value Date   NA 140 05/15/2018   K 3.7 05/15/2018   CL 106 05/15/2018   CO2 26 05/15/2018   Lab Results  Component Value Date   ALT 28 05/15/2018   AST 27 05/15/2018   ALKPHOS 137 (H) 05/15/2018   BILITOT 0.7 05/15/2018    PULMONARY FUNCTION TEST:   Recent Review Flowsheet Data    Spirometry Latest Ref Rng & Units 05/15/2017   FVC-%PRED-PRE % 67   FVC-%PRED-POST % 74   FEV1-PRE L 2.07   FEV1-%PRED-PRE % 71   FEV1-POST L 2.31  FEV1-%PRED-POST % 79   DLCO [facility] ml/min/mmHg 18.54      RADIOGRAPHY: Nm Pet Image Initial (pi) Skull Base To Thigh  Result Date: 04/19/2018 CLINICAL DATA:  Initial treatment strategy for enlarging right lower lobe pulmonary nodule. EXAM: NUCLEAR MEDICINE PET SKULL BASE TO THIGH TECHNIQUE: 10.5 mCi F-18 FDG was injected intravenously. Full-ring PET imaging was performed from the skull base to thigh after the radiotracer. CT data was obtained and used for attenuation correction and anatomic localization. Fasting blood glucose: 109 mg/dl COMPARISON:  04/09/2018 chest CT. FINDINGS: Mediastinal blood pool activity: SUV max 3.1 NECK: No hypermetabolic lymph nodes in the neck. Incidental CT findings: none CHEST: Hypermetabolic 2.8 x 1.4 cm posterior right lower lobe pulmonary nodule with max SUV 10.1 (series 8/image 34). Sub solid 1.3 cm peripheral left upper lobe pulmonary nodule with low level metabolism with max SUV 2.2 (series 8/image 32), new since 04/09/2018 chest CT. No additional hypermetabolic pulmonary findings. No hypermetabolic  axillary, mediastinal or hilar lymph nodes. Incidental CT findings: Coronary atherosclerosis. Atherosclerotic nonaneurysmal thoracic aorta. Mild-to-moderate centrilobular and paraseptal emphysema with diffuse bronchial wall thickening. Patchy subpleural reticulation and ground-glass attenuation throughout both lungs, unchanged. ABDOMEN/PELVIS: No abnormal hypermetabolic activity within the liver, pancreas, adrenal glands, or spleen. No hypermetabolic lymph nodes in the abdomen or pelvis. Intense focal hypermetabolism in the right prostate with max SUV 13.0. Incidental CT findings: Mildly irregular liver surface, unchanged, suggesting up attic cirrhosis. Cholelithiasis. Nonobstructing 4 mm upper left renal stone. Exophytic hyperdense subcentimeter renal cortical lesion in the lower right kidney, which requires no follow-up. Atherosclerotic nonaneurysmal abdominal aorta. Marked sigmoid diverticulosis. SKELETON: No focal hypermetabolic activity to suggest skeletal metastasis. Incidental CT findings: Bilateral total shoulder arthroplasty. IMPRESSION: 1. Hypermetabolic 2.8 cm posterior right lower lobe pulmonary nodule, compatible with primary bronchogenic carcinoma. 2. No hypermetabolic thoracic adenopathy or distant metastatic disease. PET-CT stage IA (T1c N0 M0). 3. Intense focal hypermetabolism in the right prostate, can not exclude prostate malignancy. Recommend correlation with serum PSA and urology consultation. 4. Subsolid 1.3 cm peripheral left upper lobe pulmonary nodule with low level metabolism, new since recent chest CT dated 04/09/2018, more likely inflammatory. Recommend attention on follow-up chest CT in 3 months. 5. Morphologic changes of hepatic cirrhosis. 6. Cholelithiasis. 7. Marked sigmoid diverticulosis. 8. Nonobstructing left nephrolithiasis. 9. Aortic Atherosclerosis (ICD10-I70.0) and Emphysema (ICD10-J43.9). Electronically Signed   By: Ilona Sorrel M.D.   On: 04/19/2018 00:43   Ct  Biopsy  Result Date: 04/29/2018 INDICATION: Posterior right lower lobe PET positive nodule EXAM: CT-GUIDED BIOPSY RIGHT LOWER LOBE NODULE MEDICATIONS: 1% lidocaine local ANESTHESIA/SEDATION: 1.0 mg IV Versed; 25 mcg IV Fentanyl Moderate Sedation Time:  11 minutes The patient was continuously monitored during the procedure by the interventional radiology nurse under my direct supervision. PROCEDURE: The procedure, risks, benefits, and alternatives were explained to the patient. Questions regarding the procedure were encouraged and answered. The patient understands and consents to the procedure. Previous imaging reviewed. Patient positioned right side down decubitus for a posterior approach. Noncontrast localization CT performed. The posterior subpleural right lower lobe nodule was localized and correlated with the PET-CT. Overlying skin marked. Under sterile conditions and local anesthesia, a 17 gauge coaxial guide needle was advanced to the lesion under CT guidance. Needle position confirmed with CT. 1 cm 18 gauge core biopsies obtained. Samples were intact and non fragmented. These were placed in formalin. Needle tract occluded with the bio sentry device. Postprocedure imaging demonstrates a small amount of surrounding alveolar hemorrhage from the biopsy.  No pneumothorax. No effusion. Patient tolerated the procedure well without complication. Vital sign monitoring by nursing staff during the procedure will continue as patient is in the special procedures unit for post procedure observation. FINDINGS: The images document guide needle placement within the posterior right lower lobe lesion. Post biopsy images demonstrate small amount of surrounding hemorrhage. COMPLICATIONS: None immediate. IMPRESSION: Successful CT-guided core biopsy of the right lower lobe nodule Electronically Signed   By: Jerilynn Mages.  Shick M.D.   On: 04/29/2018 12:33   Dg Chest Port 1 View  Result Date: 04/29/2018 CLINICAL DATA:  Status post biopsy  of right lower lobe lung nodule. EXAM: PORTABLE CHEST 1 VIEW COMPARISON:  Imaging during CT-guided lung biopsy earlier today. FINDINGS: The heart size is normal. No pneumothorax following lung biopsy. Lungs show evidence of chronic disease. There is no evidence of pulmonary edema, consolidation or pleural fluid. IMPRESSION: No pneumothorax or other acute findings following lung biopsy. Electronically Signed   By: Aletta Edouard M.D.   On: 04/29/2018 13:57      IMPRESSION: Stage IA (T1c N0 M0) Squamous Cell Carcinoma of the Right Lower Lobe Patient is a good candidate for either surgery  or SBRT concerning his clinical stage I non-small cell lung cancer. I discussed the general course of radiation therapy anticipated side effects and potential long-term toxicities of stereotactic body radiation therapy. We also discussed the potential advantages of surgery as it compares to radiation treatment.     PLAN: The patient is leaning towards surgery at this time and will meet with Dr. Servando Snare later this afternoon for thoracic surgery consultation. Anticipate the patient will make his decision in the near future.      ------------------------------------------------  Blair Promise, PhD, MD  This document serves as a record of services personally performed by Gery Pray, MD. It was created on his behalf by Wilburn Mylar, a trained medical scribe. The creation of this record is based on the scribe's personal observations and the provider's statements to them. This document has been checked and approved by the attending provider.

## 2018-05-15 NOTE — Progress Notes (Signed)
[place] [phone]   [phone] Multidisciplinary thoracic oncology clinic  CONSULT NOTE  REFERRING PHYSICIAN: Dr. Christinia Gully  REASON FOR CONSULTATION:  78 years old white male recently diagnosed with lung cancer.  HPI George Vega is a 78 y.o. male with past medical history significant for hypertension, coronary artery disease, depression, atrial fibrillation, osteoarthritis, stroke 7 years ago as well as heterozygous hemochromatosis and history for smoking but quit 21 years ago.  The patient was seen by Dr. Melvyn Novas in January 2020 and was complaining of shortness of breath. He ordered CT screening of the chest that was performed on April 09, 2018 and that showed soft tissue nodule in the subpleural right lower lobe measuring 1.4 x 2.9 cm increased from 0.9 x 2.1 cm on May 07, 2017.  A PET scan was performed on 04/18/2018 and that showed hypermetabolic 2.8 cm posterior right lower lobe pulmonary nodule compatible with primary bronchogenic carcinoma.  There was no hypermetabolic thoracic adenopathy or distant metastatic disease.  There was also intense focal hypermetabolism in the right prostate and prostate malignancy could not be excluded.  The patient is followed [facility].  He also has a sub-solid 1.3 cm peripheral left upper lobe pulmonary nodule with low-level metabolism new since the previous scan in January and most likely inflammatory.  On April 29, 2018 the patient underwent CT-guided core biopsy of the right lower lobe lung nodule by interventional radiology. The final pathology ([id number]) was consistent with squamous cell carcinoma. Dr. Melvyn Novas kindly referred the patient to the multidisciplinary thoracic oncology clinic today for further evaluation and recommendation regarding treatment of his condition. When seen today he is feeling fine with no concerning complaints except for mild shortness of breath with exertion.  He  denied having any chest pain, cough or hemoptysis.  He denied having any weight loss or night sweats.  He has no nausea, vomiting, diarrhea or constipation.  He denied having any headache or visual changes.  He continues to complain of arthralgia. Family history significant for mother with colon cancer, father had heart disease, brother had salivary gland malignancy. The patient is married and has 3 daughters.  He was accompanied today by his wife George Vega.  He used to work as a Editor, commissioning for [employer].  He has a history of smoking up to 2 pack/day for around 35 years and quit 21 years ago.  He drinks a couple of alcoholic drinks every day.  He has no history of drug abuse.   HPI  Past Medical History:  Diagnosis Date  . Arthritis   . Atrial fibrillation, persistent   . Chronic sinusitis   . Coronary artery disease   . Depression   . Difficult intubation    was told with shoulder done 2006-alittle narrow  . 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 Hypertension   . Jejunostomy tube fell out    when asked about this in 04/2017, pt denied ever having a J tube, feeding tube, tubes placed post surgery so ??? veracity of a previous J tube.    . 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 Spastic colon   . Squamous cell carcinoma of lung, stage I, right ([place]) 05/07/2018   bx 04/29/18; isolated PET uptake in RLL mass  . Stroke (cerebrum) ([place])   . SVT (supraventricular tachycardia) ([facility])     Past Surgical History:  Procedure Laterality Date  . COLONOSCOPY WITH PROPOFOL N/A 04/17/2017   Procedure:  COLONOSCOPY WITH PROPOFOL;  Surgeon: Yetta Flock, MD;  Location: [place];  Service: Gastroenterology;  Laterality: N/A;  . ESOPHAGEAL DILATION  2017  . ESOPHAGOGASTRODUODENOSCOPY (EGD) WITH PROPOFOL N/A 04/17/2017   Procedure: ESOPHAGOGASTRODUODENOSCOPY (EGD) WITH PROPOFOL;  Surgeon: Yetta Flock, MD;  Location: [place];  Service: Gastroenterology;  Laterality: N/A;  . FOOT  NEUROMA SURGERY  2002  . LEFT HEART CATHETERIZATION WITH CORONARY ANGIOGRAM Right 06/14/2011   20% LM, chronic occluded mid LAD, 50% ostial LCX, 20% mid RI, RCA with collaterals to mid LAD, mid 10% stenosis, EF 60% 06/14/11  . NASAL SINUS SURGERY    . SHOULDER ARTHROSCOPY  03/01/2011   Procedure: ARTHROSCOPY SHOULDER;  Surgeon: Ninetta Lights, MD;  Location: [place];  Service: Orthopedics;  Laterality: Right;  arthroscopy shoulder decompression subacromial partial acromioplasty with coracoacromial release, distal claviculectomy, debridement of labrium  . SHOULDER ARTHROSCOPY  03/01/2011   Procedure: ARTHROSCOPY SHOULDER;  Surgeon: Ninetta Lights, MD;  Location: [place];  Service: Orthopedics;  Laterality: Right;  arthroscopy shoulder decompression subacromial partial acromioplasty with coracoacromial release, distal claviculectomy, debridement of labrium  . TEE WITHOUT CARDIOVERSION N/A 03/19/2016   Procedure: TRANSESOPHAGEAL ECHOCARDIOGRAM (TEE);  Surgeon: Larey Dresser, MD;  Location: [place];  Service: Cardiovascular;  Laterality: N/A;  . TOTAL KNEE ARTHROPLASTY Right 09/23/2013   Procedure: TOTAL KNEE ARTHROPLASTY;  Surgeon: Ninetta Lights, MD;  Location: [place];  Service: Orthopedics;  Laterality: Right;  . TOTAL SHOULDER ARTHROPLASTY  06/28/2011   Procedure: TOTAL SHOULDER ARTHROPLASTY;  Surgeon: Ninetta Lights, MD;  Location: [place];  Service: Orthopedics;  Laterality: Right;  . UVULOPALATOPHARYNGOPLASTY      Family History  Problem Relation Age of Onset  . Cancer Mother        colon  . Heart attack Father   . Migraines Brother     Social History Social History   Tobacco Use  . Smoking status: Former Smoker    Packs/day: 2.00    Years: 30.00    Pack years: 60.00    Types: Cigarettes    Last attempt to quit: 02/27/1998    Years since quitting: 20.2  . Smokeless tobacco: Never Used  Substance Use Topics  . Alcohol  use: Yes    Alcohol/week: 14.0 standard drinks    Types: 14 Standard drinks or equivalent per week    Comment: daily  . Drug use: No    Allergies  Allergen Reactions  . Glucosamine-Chondroitin Anaphylaxis and Other (See Comments)    Stomach cramps, can't eat   . Tetanus Toxoid Anaphylaxis and Hives    REACTION: hives/throat swells shut   . Fish Oil Other (See Comments)    Stomach cramps, can't eat   . Ibuprofen Nausea Only and Other (See Comments)    Stomach pains   . Naproxen Diarrhea    Current Outpatient Medications  Medication Sig Dispense Refill  . acetaminophen (TYLENOL) 325 MG tablet Take 650 mg by mouth every 6 (six) hours as needed for moderate pain.    Marland Kitchen amLODipine (NORVASC) 5 MG tablet TAKE 1 TABLET BY MOUTH  DAILY (Patient taking differently: Take 5 mg by mouth daily. ) 90 tablet 3  . Cholecalciferol (VITAMIN D3) 5000 units TABS Take 5,000 Units by mouth daily.    . Cyanocobalamin (VITAMIN B-12) 500 MCG SUBL Place 500 mcg under the tongue 3 (three) times a week.     . dofetilide (TIKOSYN) 250 MCG capsule TAKE 1  CAPSULE BY MOUTH TWO TIMES DAILY (Patient taking differently: Take 250 mcg by mouth 2 (two) times daily. ) 180 capsule 2  . FEROSUL 325 (65 Fe) MG tablet TAKE 1 TABLET BY MOUTH TWICE DAILY (Patient taking differently: Take 325 mg by mouth 2 (two) times daily with a meal. ) 180 tablet 1  . furosemide (LASIX) 40 MG tablet TAKE 1 TABLET(40 MG) BY MOUTH DAILY (Patient taking differently: Take 40 mg by mouth daily. ) 90 tablet 2  . irbesartan (AVAPRO) 150 MG tablet TAKE 1 TABLET(150 MG) BY MOUTH DAILY (Patient taking differently: Take 150 mg by mouth daily. TAKE 1 TABLET(150 MG) BY MOUTH DAILY) 90 tablet 3  . magnesium oxide (MAG-OX) 400 MG tablet Take 400 mg by mouth daily.    . Melatonin 10 MG TABS Take 10 mg by mouth at bedtime as needed (sleep).     . metoprolol tartrate (LOPRESSOR) 25 MG tablet TAKE 1 TABLET BY MOUTH TWO  TIMES DAILY (Patient taking differently:  Take 25 mg by mouth 2 (two) times daily. ) 180 tablet 3  . Multiple Vitamins-Minerals (PRESERVISION AREDS 2 PO) Take 1 capsule by mouth 2 (two) times daily.    . pantoprazole (PROTONIX) 40 MG tablet Take 40 mg by mouth daily.     Marland Kitchen PARoxetine (PAXIL) 30 MG tablet Take 30 mg by mouth at bedtime.     . polycarbophil (FIBERCON) 625 MG tablet Take 1,250 mg by mouth at bedtime.     [name] Glycol-Propyl Glycol (SYSTANE OP) Place 2 drops into both eyes 2 (two) times daily as needed (dry eyes).     . potassium chloride (K-DUR) 10 MEQ tablet Take 1 tablet (10 mEq total) by mouth daily. 90 tablet 3  . rosuvastatin (CRESTOR) 5 MG tablet TAKE 1 TABLET BY MOUTH  EVERY MORNING (Patient taking differently: Take 5 mg by mouth daily. ) 90 tablet 3  . tamsulosin (FLOMAX) 0.4 MG CAPS capsule Take 0.4 mg by mouth daily.  11  . XARELTO 15 MG TABS tablet TAKE 1 TABLET BY MOUTH  DAILY WITH SUPPER (Patient taking differently: Take 15 mg by mouth daily with supper. ) 90 tablet 1   No current facility-administered medications for this visit.     Review of Systems  Constitutional: negative Eyes: negative Ears, nose, mouth, throat, and face: negative Respiratory: positive for dyspnea on exertion Cardiovascular: negative Gastrointestinal: negative Genitourinary:negative Integument/breast: negative Hematologic/lymphatic: negative Musculoskeletal:positive for arthralgias Neurological: negative Behavioral/Psych: negative Endocrine: negative Allergic/Immunologic: negative  Physical Exam  WUJ:WJXBJ, healthy, no distress, well nourished and well developed SKIN: skin color, texture, turgor are normal, no rashes or significant lesions HEAD: Normocephalic, No masses, lesions, tenderness or abnormalities EYES: normal, PERRLA, Conjunctiva are pink and non-injected EARS: External ears normal, Canals clear OROPHARYNX:no exudate, no erythema and lips, buccal mucosa, and tongue normal  NECK: supple, no adenopathy,  no JVD LYMPH:  no palpable lymphadenopathy, no hepatosplenomegaly LUNGS: clear to auscultation , and palpation HEART: regular rate & rhythm, no murmurs and no gallops ABDOMEN:abdomen soft, non-tender, normal bowel sounds and no masses or organomegaly BACK: Back symmetric, no curvature., No CVA tenderness EXTREMITIES:no joint deformities, effusion, or inflammation, no edema  NEURO: alert & oriented x 3 with fluent speech, no focal motor/sensory deficits  PERFORMANCE STATUS: ECOG 1  LABORATORY DATA: Lab Results  Component Value Date   WBC 8.2 05/15/2018   HGB 13.8 05/15/2018   HCT 42.7 05/15/2018   MCV 97.7 05/15/2018   PLT 196 05/15/2018  Chemistry      Component Value Date/Time   NA 138 08/28/2017 2151   NA 140 05/08/2017 0828   K 4.3 08/28/2017 2151   CL 102 08/28/2017 2151   CO2 27 08/28/2017 2151   BUN 15 08/28/2017 2151   BUN 17 05/08/2017 0828   CREATININE 1.03 08/28/2017 2151      Component Value Date/Time   CALCIUM 9.3 08/28/2017 2151   ALKPHOS 97 04/15/2017 1636   AST 12 04/15/2017 1636   ALT 9 04/15/2017 1636   BILITOT 0.5 04/15/2017 1636       RADIOGRAPHIC STUDIES: Nm Pet Image Initial (pi) Skull Base To Thigh  Result Date: 04/19/2018 CLINICAL DATA:  Initial treatment strategy for enlarging right lower lobe pulmonary nodule. EXAM: NUCLEAR MEDICINE PET SKULL BASE TO THIGH TECHNIQUE: 10.5 mCi F-18 FDG was injected intravenously. Full-ring PET imaging was performed from the skull base to thigh after the radiotracer. CT data was obtained and used for attenuation correction and anatomic localization. Fasting blood glucose: 109 mg/dl COMPARISON:  04/09/2018 chest CT. FINDINGS: Mediastinal blood pool activity: SUV max 3.1 NECK: No hypermetabolic lymph nodes in the neck. Incidental CT findings: none CHEST: Hypermetabolic 2.8 x 1.4 cm posterior right lower lobe pulmonary nodule with max SUV 10.1 (series 8/image 34). Sub solid 1.3 cm peripheral left upper lobe  pulmonary nodule with low level metabolism with max SUV 2.2 (series 8/image 32), new since 04/09/2018 chest CT. No additional hypermetabolic pulmonary findings. No hypermetabolic axillary, mediastinal or hilar lymph nodes. Incidental CT findings: Coronary atherosclerosis. Atherosclerotic nonaneurysmal thoracic aorta. Mild-to-moderate centrilobular and paraseptal emphysema with diffuse bronchial wall thickening. Patchy subpleural reticulation and ground-glass attenuation throughout both lungs, unchanged. ABDOMEN/PELVIS: No abnormal hypermetabolic activity within the liver, pancreas, adrenal glands, or spleen. No hypermetabolic lymph nodes in the abdomen or pelvis. Intense focal hypermetabolism in the right prostate with max SUV 13.0. Incidental CT findings: Mildly irregular liver surface, unchanged, suggesting up attic cirrhosis. Cholelithiasis. Nonobstructing 4 mm upper left renal stone. Exophytic hyperdense subcentimeter renal cortical lesion in the lower right kidney, which requires no follow-up. Atherosclerotic nonaneurysmal abdominal aorta. Marked sigmoid diverticulosis. SKELETON: No focal hypermetabolic activity to suggest skeletal metastasis. Incidental CT findings: Bilateral total shoulder arthroplasty. IMPRESSION: 1. Hypermetabolic 2.8 cm posterior right lower lobe pulmonary nodule, compatible with primary bronchogenic carcinoma. 2. No hypermetabolic thoracic adenopathy or distant metastatic disease. PET-CT stage IA (T1c N0 M0). 3. Intense focal hypermetabolism in the right prostate, can not exclude prostate malignancy. Recommend correlation with serum PSA and urology consultation. 4. Subsolid 1.3 cm peripheral left upper lobe pulmonary nodule with low level metabolism, new since recent chest CT dated 04/09/2018, more likely inflammatory. Recommend attention on follow-up chest CT in 3 months. 5. Morphologic changes of hepatic cirrhosis. 6. Cholelithiasis. 7. Marked sigmoid diverticulosis. 8. Nonobstructing  left nephrolithiasis. 9. Aortic Atherosclerosis (ICD10-I70.0) and Emphysema (ICD10-J43.9). Electronically Signed   By: Ilona Sorrel M.D.   On: 04/19/2018 00:43   Ct Biopsy  Result Date: 04/29/2018 INDICATION: Posterior right lower lobe PET positive nodule EXAM: CT-GUIDED BIOPSY RIGHT LOWER LOBE NODULE MEDICATIONS: 1% lidocaine local ANESTHESIA/SEDATION: 1.0 mg IV Versed; 25 mcg IV Fentanyl Moderate Sedation Time:  11 minutes The patient was continuously monitored during the procedure by the interventional radiology nurse under my direct supervision. PROCEDURE: The procedure, risks, benefits, and alternatives were explained to the patient. Questions regarding the procedure were encouraged and answered. The patient understands and consents to the procedure. Previous imaging reviewed. Patient positioned right side down decubitus for  a posterior approach. Noncontrast localization CT performed. The posterior subpleural right lower lobe nodule was localized and correlated with the PET-CT. Overlying skin marked. Under sterile conditions and local anesthesia, a 17 gauge coaxial guide needle was advanced to the lesion under CT guidance. Needle position confirmed with CT. 1 cm 18 gauge core biopsies obtained. Samples were intact and non fragmented. These were placed in formalin. Needle tract occluded with the bio sentry device. Postprocedure imaging demonstrates a small amount of surrounding alveolar hemorrhage from the biopsy. No pneumothorax. No effusion. Patient tolerated the procedure well without complication. Vital sign monitoring by nursing staff during the procedure will continue as patient is in the special procedures unit for post procedure observation. FINDINGS: The images document guide needle placement within the posterior right lower lobe lesion. Post biopsy images demonstrate small amount of surrounding hemorrhage. COMPLICATIONS: None immediate. IMPRESSION: Successful CT-guided core biopsy of the right  lower lobe nodule Electronically Signed   By: Jerilynn Mages.  Shick M.D.   On: 04/29/2018 12:33   Dg Chest Port 1 View  Result Date: 04/29/2018 CLINICAL DATA:  Status post biopsy of right lower lobe lung nodule. EXAM: PORTABLE CHEST 1 VIEW COMPARISON:  Imaging during CT-guided lung biopsy earlier today. FINDINGS: The heart size is normal. No pneumothorax following lung biopsy. Lungs show evidence of chronic disease. There is no evidence of pulmonary edema, consolidation or pleural fluid. IMPRESSION: No pneumothorax or other acute findings following lung biopsy. Electronically Signed   By: Aletta Edouard M.D.   On: 04/29/2018 13:57    ASSESSMENT: This is a very pleasant 78 years old white male recently diagnosed with a stage IA (T1c, N0, M0) non-small cell lung cancer, squamous cell carcinoma presented with right lower lobe lung nodule diagnosed in February 2020.   PLAN: I had a lengthy discussion with the patient and his wife today about his current disease stage, prognosis and treatment options.  I personally and independently reviewed the imaging studies and discussed the result and showed the images to the patient and his wife today. I strongly recommend for the patient to consider surgical resection if his cardiac and pulmonary function permits. If he is not a good surgical candidate, he may benefit from stereotactic body radiotherapy. We will arrange for the patient to see cardiothoracic surgery as well as radiation oncology later today for discussion of these options. I will arrange for the patient to come back for follow-up visit after his surgical evaluation for discussion of any need for adjuvant therapy. For the hypermetabolic right prostate lesion, the patient was advised to discuss with his urologist for any further evaluation and intervention. The patient was advised to call immediately if he has any other concerning symptoms in the interval. The patient voices understanding of current disease  status and treatment options and is in agreement with the current care plan. All questions were answered. The patient knows to call the clinic with any problems, questions or concerns. We can certainly see the patient much sooner if necessary.  Thank you so much for allowing me to participate in the care of George Vega. I will continue to follow up the patient with you and assist in his care.  I spent 40 minutes counseling the patient face to face. The total time spent in the appointment was 60 minutes.  Disclaimer: This note was dictated with voice recognition software. Similar sounding words can inadvertently be transcribed and may not be corrected upon review.   Eilleen Kempf May 15, 2018, 1:52 PM

## 2018-05-15 NOTE — Progress Notes (Signed)
Oncology Nurse Navigator Documentation  Oncology Nurse Navigator Flowsheets 05/15/2018  Navigator Location [facility]  Navigator Encounter Type Clinic/MDC/I spoke with patient and wife at thoracic clinic.  [name] treatment plan is resection.  I gave and explained information on lung cancer.    Abnormal Finding Date 04/09/2018  Confirmed Diagnosis Date 04/29/2018  Multidisiplinary Clinic Date 05/15/2018  Patient Visit Type MedOnc  Treatment Phase Pre-Tx/Tx Discussion  Barriers/Navigation Needs Education  Education Newly Diagnosed Cancer Education  Interventions Education  Education Method Verbal;Written  Acuity Level 1  Time Spent with Patient 30

## 2018-05-16 ENCOUNTER — Other Ambulatory Visit: Payer: Self-pay | Admitting: *Deleted

## 2018-05-16 ENCOUNTER — Telehealth: Payer: Self-pay | Admitting: Internal Medicine

## 2018-05-16 DIAGNOSIS — R911 Solitary pulmonary nodule: Secondary | ICD-10-CM

## 2018-05-16 NOTE — Telephone Encounter (Signed)
   Primary Cardiologist: Cristopher Peru, MD  Chart reviewed as part of pre-operative protocol coverage. Patient was contacted 05/16/2018 in reference to pre-operative risk assessment for pending surgery as outlined below.  George Vega was last seen on 04/08/18 by Dr. Lovena Le.  Since that day, George Vega has done well w/o any anginal symptoms. Able to complete 4 METs, walk up a flight of steps, w/o angina.   Therefore, based on ACC/AHA guidelines, the patient would be at acceptable risk for the planned procedure without further cardiovascular testing.   I will route to pharmacy to address anticoagulation. Once pharmacy weighs in, I will route to requesting provider and remove from pre-op pool.  Please call with questions.  Lyda Jester, PA-C 05/16/2018, 10:56 AM

## 2018-05-16 NOTE — Telephone Encounter (Signed)
Patient with diagnosis of Afib on Xarelto for anticoagulation.    Procedure: lump lobectomy  Date of procedure: pending  CHADS2-VASc score of  7 (CHF, HTN, AGE, PreDM, stroke/tia x 2, CAD, AGE, male)  CrCl 129ml/min  Per office protocol, patient can hold Xarelto for 24 hours prior to procedure.  With history of stroke would recommend resume Xarelto as soon as safe.

## 2018-05-16 NOTE — Telephone Encounter (Signed)
° °  [facility] Pre-operative Risk Assessment    Request for surgical clearance:  1. What type of surgery is being performed?Lump LLobecpomy  2. When is this surgery scheduled? Pending 3.  4. What type of clearance is required (medical clearance vs. Pharmacy clearance to hold med vs. Both)? Medical and Pharmacy  5. Are there any medications that need to be held prior to surgery and how long?  Xarelto- does pt need to stop it and if so how long?  6. Practice name and name of physician performing surgery? Dr Lanelle Bal   7. What is your office phone number [phone]    7.   What is your office fax number [phone]  8.   Anesthesia type (None, local, MAC, general) ? She did not know   Glyn Ade 05/16/2018, 10:22 AM  _________________________________________________________________   (provider comments below)

## 2018-05-26 ENCOUNTER — Other Ambulatory Visit: Payer: Self-pay

## 2018-05-26 ENCOUNTER — Ambulatory Visit (HOSPITAL_COMMUNITY)
Admission: RE | Admit: 2018-05-26 | Discharge: 2018-05-26 | Disposition: A | Discharge status: Home / Self Care | Payer: Medicare Other | Source: Ambulatory Visit | Attending: Cardiothoracic Surgery | Admitting: Cardiothoracic Surgery

## 2018-05-26 DIAGNOSIS — R911 Solitary pulmonary nodule: Secondary | ICD-10-CM | POA: Diagnosis present

## 2018-05-26 LAB — PULMONARY FUNCTION TEST
DL/VA % pred: 86 %
DL/VA: 3.43 ml/min/mmHg/L
DLCO cor % pred: 60 %
DLCO cor: 14.57 ml/min/mmHg
DLCO unc % pred: 59 %
DLCO unc: 14.23 ml/min/mmHg
FEF 25-75 Post: 1.96 L/sec
FEF 25-75 Pre: 1.29 L/sec
FEF2575-%Change-Post: 52 %
FEF2575-%Pred-Post: 96 %
FEF2575-%Pred-Pre: 63 %
FEV1-%Change-Post: 9 %
FEV1-%Pred-Post: 69 %
FEV1-%Pred-Pre: 63 %
FEV1-Post: 2 L
FEV1-Pre: 1.82 L
FEV1FVC-%Change-Post: 1 %
FEV1FVC-%Pred-Pre: 103 %
FEV6-%Change-Post: 10 %
FEV6-%Pred-Post: 70 %
FEV6-%Pred-Pre: 63 %
FEV6-Post: 2.63 L
FEV6-Pre: 2.38 L
FEV6FVC-%Change-Post: 2 %
FEV6FVC-%Pred-Post: 106 %
FEV6FVC-%Pred-Pre: 104 %
FVC-%Change-Post: 8 %
FVC-%Pred-Post: 65 %
FVC-%Pred-Pre: 60 %
FVC-Post: 2.63 L
FVC-Pre: 2.43 L
Post FEV1/FVC ratio: 76 %
Post FEV6/FVC ratio: 100 %
Pre FEV1/FVC ratio: 75 %
Pre FEV6/FVC Ratio: 98 %
RV % pred: 101 %
RV: 2.58 L
TLC % pred: 75 %
TLC: 5.15 L

## 2018-05-26 MED ORDER — ALBUTEROL SULFATE (2.5 MG/3ML) 0.083% IN NEBU
2.5000 mg | INHALATION_SOLUTION | Freq: Once | RESPIRATORY_TRACT | Status: AC
  Administered 2018-05-26: 2.5 mg via RESPIRATORY_TRACT

## 2018-05-27 ENCOUNTER — Other Ambulatory Visit: Payer: Self-pay

## 2018-05-27 ENCOUNTER — Ambulatory Visit (INDEPENDENT_AMBULATORY_CARE_PROVIDER_SITE_OTHER): Payer: Medicare Other | Admitting: Cardiothoracic Surgery

## 2018-05-27 VITALS — BP 112/62 | HR 74 | Resp 20 | Ht 69.0 in | Wt 214.0 lb

## 2018-05-27 DIAGNOSIS — C3431 Malignant neoplasm of lower lobe, right bronchus or lung: Secondary | ICD-10-CM

## 2018-05-27 DIAGNOSIS — I2510 Atherosclerotic heart disease of native coronary artery without angina pectoris: Secondary | ICD-10-CM

## 2018-05-27 NOTE — Patient Instructions (Signed)
Lung Resection  A lung resection is a procedure to remove part or all of a lung. An entire lung may be removed (pneumonectomy), or only part of it may be removed (lobectomy). A lung resection may be done as an open surgery or as a minimally invasive surgery. A lung resection is most often done to remove a tumor or cancer, but it may be done to treat other conditions. The procedure can relieve symptoms and keep the problem from getting worse. Tell a health care provider about:  Any allergies you have.  All medicines you are taking, including vitamins, herbs, eye drops, creams, and over-the-counter medicines.  Any problems you or family members have had with anesthetic medicines.  Any blood disorders you have.  Any surgeries you have had.  Any medical conditions you have.  Whether you are pregnant or may be pregnant. What are the risks? Generally, this is a safe procedure. However, problems may occur, including:  Excessive bleeding.  Infection.  Reaction to anesthesia.  Allergic reaction to medicines.  Blood clots.  Injury to a nerve or blood vessel.  Problems breathing.  Heart problems.  Stroke. What happens before the procedure? Staying hydrated Follow instructions from your health care provider about hydration, which may include:  Up to 2 hours before the procedure - you may continue to drink clear liquids, such as water, clear fruit juice, black coffee, and plain tea. Eating and drinking restrictions Follow instructions from your health care provider about eating and drinking, which may include:  8 hours before the procedure - stop eating heavy meals or foods such as meat, fried foods, or fatty foods.  6 hours before the procedure - stop eating light meals or foods, such as toast or cereal.  6 hours before the procedure - stop drinking milk or drinks that contain milk.  2 hours before the procedure - stop drinking clear liquids. Medicines Ask your health care  provider about:  Changing or stopping your regular medicines. This is especially important if you are taking diabetes medicines or blood thinners.  Taking medicines such as aspirin and ibuprofen. These medicines can thin your blood. Do not take these medicines unless your health care provider tells you to take them.  Taking over-the-counter medicines, vitamins, herbs, and supplements. Tests You may have tests done before the procedure, including:  Blood and urine tests.  Imaging tests, such as X-rays, CT, MRI, or PET scans.  Bronchoscopy. In this procedure, a health care provider uses a flexible tube (bronchoscope) to look at the inside of your airways.  Pulmonary function tests. These are done to check how well your lungs work.  Heart testing. This is done to check heart function before the procedure.  Lymph node sampling. This may be done to see if you have a tumor that has spread. General instructions  Plan to have someone take you home from the hospital or clinic.  Plan to have a responsible adult care for you for at least 24 hours after you leave the hospital or clinic. This is important.  Do not use any products that contain nicotine or tobacco for as long as possible before your procedure. These include cigarettes and e-cigarettes. If you need help quitting, ask your health care provider.  Ask your health care provider what steps will be taken to help prevent infection. These may include: ? Removing hair at the surgery site. ? Washing skin with a germ-killing soap. ? Taking antibiotic medicine. What happens during the procedure?  An  IV will be inserted into one of your veins. You will be given one or both of the following: ? A medicine to help you relax (sedative). ? A medicine to make you fall asleep (general anesthetic).  A breathing tube will be placed in your throat.  A thin tube (catheter) may be inserted into the part of your body that drains urine from the  bladder (urethra). The catheter will drain your urine.  Your health care provider will make a large incision on your side (open lung surgery) or several small incisions over your chest area (minimally invasive surgery).  Your health care provider will carefully cut any tissues leading to the area of the lung being treated.  The lung or part of the lung will then be removed. Lymph nodes near the lung may also be removed for testing.  Your health care provider will check inside your chest to make sure there is no bleeding in or around the lungs.  Your health care provider may put tubes into your chest to drain extra fluid and air after surgery.  Your incisions will be closed. This may be done using stitches (sutures), staples, skin glue, or skin tape (adhesive) strips.  A bandage (dressing) may be placed over your incisions. The procedure may vary among health care providers and hospitals. What happens after the procedure?  Your blood pressure, heart rate, breathing rate, and blood oxygen level will be monitored until you leave the hospital or clinic.  Right after surgery, you may: ? Be moved to the intensive care unit (ICU). ? Continue to have a tube to help you breathe or have a urinary catheter. ? Have an IV for fluids and medicines. ? Remain on a respirator, if assistance is needed to help you breathe. ? Start respiratory therapy in the ICU. This will help your other lung to get strong and stay healthy. ? Be given medicine to help with pain and nausea.  You may have to wear compression stockings. These stockings help to prevent blood clots and reduce swelling in your legs.  As you continue to recover: ? You will be moved to a regular hospital room. Therapy will continue. ? You may be released to go home or to an extended care facility. Summary  A lung resection is a procedure to remove part or all of a lung. It can be done as an open surgery or a minimally invasive surgery.  A  lung resection is most often done to remove a tumor or cancer, but it may be done to treat other conditions.  After surgery, respiratory therapy will be prescribed to help your lung recover and become stronger. This information is not intended to replace advice given to you by your health care provider. Make sure you discuss any questions you have with your health care provider. Document Released: 05/19/2002 Document Revised: 03/11/2017 Document Reviewed: 03/11/2017 Elsevier Interactive Patient Education  2019 [employer].    Lung Resection, Care After This sheet gives you information about how to care for yourself after your procedure. Your health care provider may also give you more specific instructions. If you have problems or questions, contact your health care provider. What can I expect after the procedure? After the procedure, it is common to have:  Pain in your throat and near your incisions.  Pain when taking deep breaths.  Nausea.  Tiredness (fatigue). Follow these instructions at home:  Medicines  Take over-the-counter and prescription medicines only as told by your health care  provider.  If you were prescribed an antibiotic medicine, take it as told by your health care provider. Do not stop taking the antibiotic even if you start to feel better.  If you are taking prescription pain medicine, take actions to prevent or treat constipation. Your health care provider may recommend that you: ? Drink enough fluid to keep your urine pale yellow. ? Eat foods that are high in fiber, such as fresh fruits and vegetables, whole grains, and beans. ? Limit foods that are high in fat and processed sugars, such as fried or sweet foods. ? Take an over-the-counter or prescription medicine for constipation. Incision care  Follow instructions from your health care provider about how to take care of your incisions. Make sure you: ? Wash your hands with soap and water before you change  your bandage (dressing). If soap and water are not available, use hand sanitizer. ? Change your dressing as told by your health care provider. ? Leave stitches (sutures), skin glue, or adhesive strips in place. These skin closures may need to stay in place for 2 weeks or longer. If adhesive strip edges start to loosen and curl up, you may trim the loose edges. Do not remove adhesive strips completely unless your health care provider tells you to do that.  Check your incision area every day for signs of infection. Check for: ? Redness, swelling, or pain. ? Fluid or blood. ? Pus or a bad smell. ? Warmth.  Do not take baths, swim, or use a hot tub until your health care provider approves. Ask your health care provider if you may take showers. Preventing pneumonia   Do breathing exercises as instructed by your health care provider. Doing this helps prevent lung infection (pneumonia).  Try to breathe deeply and cough as told by your health care provider. Holding a pillow firmly over your ribs may help with discomfort.  If you were given an incentive spirometer in the hospital, continue to use it as directed by your health care provider.  Participate in pulmonary rehabilitation as directed by your health care provider. This is a program that combines education, exercise, and support from a team of specialists. The goal is to help you heal and get back to your normal activities as soon as possible. Activity  Rest as told by your health care provider.  Avoid sitting for a long time without moving. Get up to take short walks every 1-2 hours. This is important to improve blood flow and breathing. Ask for help if you feel weak or unsteady.  Ask your health care provider what activities are safe for you.  Do not lift anything that is heavier than 10 lb (4.5 kg), or the limit that you are told, until your health care provider says that it is safe.  Return to a normal diet and activities as told by  your health care provider. General instructions  Wear compression stockings as told by your health care provider. These stockings help to prevent blood clots and reduce swelling in your legs.  If you have a chest tube, care for it as instructed by your health care provider. Do not travel by airplane during the 2 weeks after your chest tube is removed, or until your health care provider says that this is safe.  Do not use any products that contain nicotine or tobacco, such as cigarettes and e-cigarettes. These can delay healing after surgery. If you need help quitting, ask your health care provider.  Do not  drive until your health care provider approves.  Do not drive or use heavy machinery while taking prescription pain medicine.  Keep all follow-up visits as told by your health care provider. This is important. Contact a health care provider if you:  Have redness, swelling, or pain around your incision.  Have fluid or blood coming from your incision.  Have pus or a bad smell coming from your incision or bandage.  Have an incision that feels warm to the touch.  Have a fever or chills.  Notice that your incision is breaking open.  Cough up blood or pus, or you develop a cough that produces bad-smelling sputum.  Have pain or swelling in your legs.  Have increasing pain that is not controlled with medicine.  Have trouble managing any of the tubes that have been left in place after surgery. Get help right away if you:  Have chest pain or an irregular or rapid heartbeat.  Feel weak, light-headed, or dizzy.  Have shortness of breath or difficulty breathing.  Have persistent nausea or vomiting.  Have a rash. These symptoms may represent a serious problem that is an emergency. Do not wait to see if the symptoms will go away. Get medical help right away. Call your local emergency services (911 in the [place] Do not drive yourself to the hospital. Summary  After lung resection  surgery, it is common to have pain around your incisions, pain when taking deep breaths, nausea, and fatigue.  Follow instructions from your health care provider about how to take care of your incisions.  Be sure to contact your health care provider if you have any redness or swelling around your incision area or if blood, pus, or other fluid drains from your incision. This information is not intended to replace advice given to you by your health care provider. Make sure you discuss any questions you have with your health care provider. Document Released: 09/15/2004 Document Revised: 03/11/2017 Document Reviewed: 03/11/2017 Elsevier Interactive Patient Education  2019 [employer].

## 2018-05-27 NOTE — Progress Notes (Signed)
[address]       [place] [address]             [phone]                    Naren J Plourde Balm Medical Record #[id number] Date of Birth: 18-Feb-1941  Referring: Tanda Rockers, MD Primary Care: Via, Lennette Bihari, MD Primary Cardiologist: Cristopher Peru, MD  Chief Complaint:   Lung cancer  History of Present Illness:    George Vega 78 y.o. male is seen in the office  Today after follow-up pulmonary functions and cardiac clearance were done to consider surgical resection of a pleural-based right lung lesion.  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  The patient had noticed increasing shortness of breath and struggling to breathe" he was referred to Dr. Melvyn Novas CT scan of chest was done and a pleural-based right lower lobe lung lesion 1.4 x 2.9 cm and had increased in size from 0.9 x 2.1 cm since February 2019 .  A PET scan was performed on 04/18/2018 which showed a hypermetabolic 2.8 cm posterior lower lobe pulmonary nodule pleural-based was appreciated .  Recent CT needle biopsy of this area confirm squamous cell carcinoma.  April 29, 2018 the patient has a previous history of atrial fibrillation and A. fib ablation has been followed by Crissie Sickles currently on Xarelto 4 years ago he had cardiac catheterization that did not reveal any significant coronary disease  ,He has a history of hypertension atrial fibrillation, osteoarthritis, he had a stroke 7 years ago but without significant sequelae.  He has been followed by Dr. Beryle Beams for history of hemochromatosis.    Family history is significant for his mother with colon cancer father had heart disease, one brother had salivary gland malignancy.  Patient is married has 3 daughters.  He works for [employer] for many years.  Denies definite asbestos exposure.  He did smoke up to 2 packs a day for 35 years but  quit approximately 20 years ago.  Diagnosis [id number] Lung, needle/core biopsy(ies), right lower lobe - SQUAMOUS CELL CARCINOMA. Microscopic Comment Results reported to Dr. Christinia Gully on 04/30/2018. Intradepartmental consultation was obtained (Dr. Tresa Moore). [name] MD  Current Activity/ Functional Status:  Patient is independent with mobility/ambulation, transfers, ADL's, IADL's.   Zubrod Score: At the time of surgery this patients most appropriate activity status/level should be described as: []     0    Normal activity, no symptoms [x]     1    Restricted in physical strenuous activity but ambulatory, able to do out light work []     2    Ambulatory and capable of self care, unable to do work activities, up and about               >50 % of waking hours                              []     3    Only limited self care, in bed greater than 50% of waking hours []     4    Completely disabled, no self care, confined to bed or chair []     5    Moribund   Past Medical History:  Diagnosis Date   Arthritis  Atrial fibrillation, persistent    Chronic sinusitis    Coronary artery disease    Depression    Difficult intubation    was told with shoulder done 2006-alittle narrow   Gait disorder 05/28/2014   Hypertension    Jejunostomy tube fell out    when asked about this in 04/2017, pt denied ever having a J tube, feeding tube, tubes placed post surgery so ??? veracity of a previous J tube.     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   Spastic colon    Squamous cell carcinoma of lung, stage I, right ([facility]) 05/07/2018   bx 04/29/18; isolated PET uptake in RLL mass   Stroke (cerebrum) (HCC)    SVT (supraventricular tachycardia) ([facility])     Past Surgical History:  Procedure Laterality Date   COLONOSCOPY WITH PROPOFOL N/A 04/17/2017   Procedure: COLONOSCOPY WITH PROPOFOL;  Surgeon: Yetta Flock, MD;  Location: [facility];  Service:  Gastroenterology;  Laterality: N/A;   ESOPHAGEAL DILATION  2017   ESOPHAGOGASTRODUODENOSCOPY (EGD) WITH PROPOFOL N/A 04/17/2017   Procedure: ESOPHAGOGASTRODUODENOSCOPY (EGD) WITH PROPOFOL;  Surgeon: Yetta Flock, MD;  Location: [facility];  Service: Gastroenterology;  Laterality: N/A;   FOOT NEUROMA SURGERY  2002   LEFT HEART CATHETERIZATION WITH CORONARY ANGIOGRAM Right 06/14/2011   20% LM, chronic occluded mid LAD, 50% ostial LCX, 20% mid RI, RCA with collaterals to mid LAD, mid 10% stenosis, EF 60% 06/14/11   NASAL SINUS SURGERY     SHOULDER ARTHROSCOPY  03/01/2011   Procedure: ARTHROSCOPY SHOULDER;  Surgeon: Ninetta Lights, MD;  Location: [facility];  Service: Orthopedics;  Laterality: Right;  arthroscopy shoulder decompression subacromial partial acromioplasty with coracoacromial release, distal claviculectomy, debridement of labrium   SHOULDER ARTHROSCOPY  03/01/2011   Procedure: ARTHROSCOPY SHOULDER;  Surgeon: Ninetta Lights, MD;  Location: [facility];  Service: Orthopedics;  Laterality: Right;  arthroscopy shoulder decompression subacromial partial acromioplasty with coracoacromial release, distal claviculectomy, debridement of labrium   TEE WITHOUT CARDIOVERSION N/A 03/19/2016   Procedure: TRANSESOPHAGEAL ECHOCARDIOGRAM (TEE);  Surgeon: Larey Dresser, MD;  Location: [facility];  Service: Cardiovascular;  Laterality: N/A;   TOTAL KNEE ARTHROPLASTY Right 09/23/2013   Procedure: TOTAL KNEE ARTHROPLASTY;  Surgeon: Ninetta Lights, MD;  Location: [facility];  Service: Orthopedics;  Laterality: Right;   TOTAL SHOULDER ARTHROPLASTY  06/28/2011   Procedure: TOTAL SHOULDER ARTHROPLASTY;  Surgeon: Ninetta Lights, MD;  Location: [facility];  Service: Orthopedics;  Laterality: Right;   UVULOPALATOPHARYNGOPLASTY      Family History  Problem Relation Age of Onset   Cancer Mother        colon   Heart attack Father    Migraines Brother       Social History   Tobacco Use  Smoking Status Former Smoker   Packs/day: 2.00   Years: 30.00   Pack years: 60.00   Types: Cigarettes   Last attempt to quit: 02/27/1998   Years since quitting: 20.2  Smokeless Tobacco Never Used    Social History   Substance and Sexual Activity  Alcohol Use Yes   Alcohol/week: 14.0 standard drinks   Types: 14 Standard drinks or equivalent per week   Comment: daily     Allergies  Allergen Reactions   Glucosamine-Chondroitin Anaphylaxis and Other (See Comments)    Stomach cramps, can't eat    Tetanus Toxoid Anaphylaxis and Hives    REACTION: hives/throat swells shut  Fish Oil Other (See Comments)    Stomach cramps, can't eat    Ibuprofen Nausea Only and Other (See Comments)    Stomach pains    Naproxen Diarrhea    Current Outpatient Medications  Medication Sig Dispense Refill   acetaminophen (TYLENOL) 325 MG tablet Take 650 mg by mouth every 6 (six) hours as needed for moderate pain.     amLODipine (NORVASC) 5 MG tablet TAKE 1 TABLET BY MOUTH  DAILY (Patient taking differently: Take 5 mg by mouth daily. ) 90 tablet 3   Cholecalciferol (VITAMIN D3) 5000 units TABS Take 5,000 Units by mouth daily.     Cyanocobalamin (VITAMIN B-12) 500 MCG SUBL Place 500 mcg under the tongue 3 (three) times a week.      dofetilide (TIKOSYN) 250 MCG capsule TAKE 1 CAPSULE BY MOUTH TWO TIMES DAILY (Patient taking differently: Take 250 mcg by mouth 2 (two) times daily. ) 180 capsule 2   FEROSUL 325 (65 Fe) MG tablet TAKE 1 TABLET BY MOUTH TWICE DAILY (Patient taking differently: Take 325 mg by mouth 2 (two) times daily with a meal. ) 180 tablet 1   furosemide (LASIX) 40 MG tablet TAKE 1 TABLET(40 MG) BY MOUTH DAILY (Patient taking differently: Take 40 mg by mouth daily. ) 90 tablet 2   irbesartan (AVAPRO) 150 MG tablet TAKE 1 TABLET(150 MG) BY MOUTH DAILY (Patient taking differently: Take 150 mg by mouth daily. TAKE 1 TABLET(150 MG)  BY MOUTH DAILY) 90 tablet 3   magnesium oxide (MAG-OX) 400 MG tablet Take 400 mg by mouth daily.     Melatonin 10 MG TABS Take 10 mg by mouth at bedtime as needed (sleep).      metoprolol tartrate (LOPRESSOR) 25 MG tablet TAKE 1 TABLET BY MOUTH TWO  TIMES DAILY (Patient taking differently: Take 25 mg by mouth 2 (two) times daily. ) 180 tablet 3   Multiple Vitamins-Minerals (PRESERVISION AREDS 2 PO) Take 1 capsule by mouth 2 (two) times daily.     pantoprazole (PROTONIX) 40 MG tablet Take 40 mg by mouth daily.      PARoxetine (PAXIL) 30 MG tablet Take 30 mg by mouth at bedtime.      polycarbophil (FIBERCON) 625 MG tablet Take 1,250 mg by mouth at bedtime.      Polyethyl Glycol-Propyl Glycol (SYSTANE OP) Place 2 drops into both eyes 2 (two) times daily as needed (dry eyes).      potassium chloride (K-DUR) 10 MEQ tablet Take 1 tablet (10 mEq total) by mouth daily. 90 tablet 3   rosuvastatin (CRESTOR) 5 MG tablet TAKE 1 TABLET BY MOUTH  EVERY MORNING (Patient taking differently: Take 5 mg by mouth daily. ) 90 tablet 3   tamsulosin (FLOMAX) 0.4 MG CAPS capsule Take 0.4 mg by mouth daily.  11   XARELTO 15 MG TABS tablet TAKE 1 TABLET BY MOUTH  DAILY WITH SUPPER (Patient taking differently: Take 15 mg by mouth daily with supper. ) 90 tablet 1   No current facility-administered medications for this visit.     Pertinent items are noted in HPI.   Review of Systems:     Cardiac Review of Systems: [Y] = yes  or   [ N ] = no   Chest Pain [  n  ]  Resting SOB [  n ] Exertional SOB  [ y ]  Orthopnea [ n ]   Pedal Edema [n   ]    Palpitations Blue.Reese  ] Syncope  [  n ]   Presyncope [n   ]   General Review of Systems: [Y] = yes [  ]=no Constitional: recent weight change [  ];  Wt loss over the last 3 months [   ] anorexia [  ]; fatigue [  ]; nausea [  ]; night sweats [  ]; fever [  ]; or chills [  ];           Eye : blurred vision [  ]; diplopia [   ]; vision changes [  ];  Amaurosis fugax[  ]; Resp:  cough [  ];  wheezing[  ];  hemoptysis[  ]; shortness of breath[  ]; paroxysmal nocturnal dyspnea[  ]; dyspnea on exertion[ y ]; or orthopnea[  ];  GI:  gallstones[  ], vomiting[  ];  dysphagia[  ]; melena[  ];  hematochezia [  ]; heartburn[  ];   Hx of  Colonoscopy[  ]; GU: kidney stones [  ]; hematuria[  ];   dysuria [  ];  nocturia[  ];  history of     obstruction [  ]; urinary frequency [  ]             Skin: rash, swelling[  ];, hair loss[  ];  peripheral edema[  ];  or itching[  ]; Musculosketetal: myalgias[  ];  joint swelling[  ];  joint erythema[  ];  joint pain[  ];  back pain[  ];  Heme/Lymph: bruising[  ];  bleeding[  ];  anemia[  ];  Neuro: TIA[  ];  headaches[  ];  stroke[  ];  vertigo[  ];  seizures[  ];   paresthesias[  ];  difficulty walking[  ];  Psych:depression[  ]; anxiety[  ];  Endocrine: diabetes[  ];  thyroid dysfunction[  ];  Immunizations: Flu up to date [  ]; Pneumococcal up to date [  ];  Other:     PHYSICAL EXAMINATION: BP 112/62    Pulse 74    Resp 20    Ht 5\' 9"  (1.753 m)    Wt 214 lb (97.1 kg)    SpO2 94% Comment: RA   BMI 31.60 kg/m  General appearance: alert, cooperative, appears stated age and no distress Head: Normocephalic, without obvious abnormality, atraumatic Neck: no adenopathy, no carotid bruit, no JVD, supple, symmetrical, trachea midline and thyroid not enlarged, symmetric, no tenderness/mass/nodules Lymph nodes: Cervical, supraclavicular, and axillary nodes normal. Resp: clear to auscultation bilaterally Back: symmetric, no curvature. ROM normal. No CVA tenderness. Cardio: regular rate and rhythm, S1, S2 normal, no murmur, click, rub or gallop GI: soft, non-tender; bowel sounds normal; no masses,  no organomegaly Extremities: extremities normal, atraumatic, no cyanosis or edema Neurologic: Grossly normal  Diagnostic Studies & Laboratory data:     Recent Radiology Findings:   Nm Pet Image Initial (pi) Skull Base To Thigh  Result Date:  04/19/2018 CLINICAL DATA:  Initial treatment strategy for enlarging right lower lobe pulmonary nodule. EXAM: NUCLEAR MEDICINE PET SKULL BASE TO THIGH TECHNIQUE: 10.5 mCi F-18 FDG was injected intravenously. Full-ring PET imaging was performed from the skull base to thigh after the radiotracer. CT data was obtained and used for attenuation correction and anatomic localization. Fasting blood glucose: 109 mg/dl COMPARISON:  04/09/2018 chest CT. FINDINGS: Mediastinal blood pool activity: SUV max 3.1 NECK: No hypermetabolic lymph nodes in the neck. Incidental CT findings: none CHEST: Hypermetabolic 2.8 x 1.4 cm posterior right lower lobe pulmonary nodule with max  SUV 10.1 (series 8/image 34). Sub solid 1.3 cm peripheral left upper lobe pulmonary nodule with low level metabolism with max SUV 2.2 (series 8/image 32), new since 04/09/2018 chest CT. No additional hypermetabolic pulmonary findings. No hypermetabolic axillary, mediastinal or hilar lymph nodes. Incidental CT findings: Coronary atherosclerosis. Atherosclerotic nonaneurysmal thoracic aorta. Mild-to-moderate centrilobular and paraseptal emphysema with diffuse bronchial wall thickening. Patchy subpleural reticulation and ground-glass attenuation throughout both lungs, unchanged. ABDOMEN/PELVIS: No abnormal hypermetabolic activity within the liver, pancreas, adrenal glands, or spleen. No hypermetabolic lymph nodes in the abdomen or pelvis. Intense focal hypermetabolism in the right prostate with max SUV 13.0. Incidental CT findings: Mildly irregular liver surface, unchanged, suggesting up attic cirrhosis. Cholelithiasis. Nonobstructing 4 mm upper left renal stone. Exophytic hyperdense subcentimeter renal cortical lesion in the lower right kidney, which requires no follow-up. Atherosclerotic nonaneurysmal abdominal aorta. Marked sigmoid diverticulosis. SKELETON: No focal hypermetabolic activity to suggest skeletal metastasis. Incidental CT findings: Bilateral total  shoulder arthroplasty. IMPRESSION: 1. Hypermetabolic 2.8 cm posterior right lower lobe pulmonary nodule, compatible with primary bronchogenic carcinoma. 2. No hypermetabolic thoracic adenopathy or distant metastatic disease. PET-CT stage IA (T1c N0 M0). 3. Intense focal hypermetabolism in the right prostate, can not exclude prostate malignancy. Recommend correlation with serum PSA and urology consultation. 4. Subsolid 1.3 cm peripheral left upper lobe pulmonary nodule with low level metabolism, new since recent chest CT dated 04/09/2018, more likely inflammatory. Recommend attention on follow-up chest CT in 3 months. 5. Morphologic changes of hepatic cirrhosis. 6. Cholelithiasis. 7. Marked sigmoid diverticulosis. 8. Nonobstructing left nephrolithiasis. 9. Aortic Atherosclerosis (ICD10-I70.0) and Emphysema (ICD10-J43.9). Electronically Signed   By: Ilona Sorrel M.D.   On: 04/19/2018 00:43   Ct Biopsy  Result Date: 04/29/2018 INDICATION: Posterior right lower lobe PET positive nodule EXAM: CT-GUIDED BIOPSY RIGHT LOWER LOBE NODULE MEDICATIONS: 1% lidocaine local ANESTHESIA/SEDATION: 1.0 mg IV Versed; 25 mcg IV Fentanyl Moderate Sedation Time:  11 minutes The patient was continuously monitored during the procedure by the interventional radiology nurse under my direct supervision. PROCEDURE: The procedure, risks, benefits, and alternatives were explained to the patient. Questions regarding the procedure were encouraged and answered. The patient understands and consents to the procedure. Previous imaging reviewed. Patient positioned right side down decubitus for a posterior approach. Noncontrast localization CT performed. The posterior subpleural right lower lobe nodule was localized and correlated with the PET-CT. Overlying skin marked. Under sterile conditions and local anesthesia, a 17 gauge coaxial guide needle was advanced to the lesion under CT guidance. Needle position confirmed with CT. 1 cm 18 gauge core  biopsies obtained. Samples were intact and non fragmented. These were placed in formalin. Needle tract occluded with the bio sentry device. Postprocedure imaging demonstrates a small amount of surrounding alveolar hemorrhage from the biopsy. No pneumothorax. No effusion. Patient tolerated the procedure well without complication. Vital sign monitoring by nursing staff during the procedure will continue as patient is in the special procedures unit for post procedure observation. FINDINGS: The images document guide needle placement within the posterior right lower lobe lesion. Post biopsy images demonstrate small amount of surrounding hemorrhage. COMPLICATIONS: None immediate. IMPRESSION: Successful CT-guided core biopsy of the right lower lobe nodule Electronically Signed   By: Jerilynn Mages.  Shick M.D.   On: 04/29/2018 12:33   Dg Chest Port 1 View  Result Date: 04/29/2018 CLINICAL DATA:  Status post biopsy of right lower lobe lung nodule. EXAM: PORTABLE CHEST 1 VIEW COMPARISON:  Imaging during CT-guided lung biopsy earlier today. FINDINGS: The heart size is  normal. No pneumothorax following lung biopsy. Lungs show evidence of chronic disease. There is no evidence of pulmonary edema, consolidation or pleural fluid. IMPRESSION: No pneumothorax or other acute findings following lung biopsy. Electronically Signed   By: Aletta Edouard M.D.   On: 04/29/2018 13:57     I have independently reviewed the above radiology studies  and reviewed the findings with the patient.   Recent Lab Findings: Lab Results  Component Value Date   WBC 8.2 05/15/2018   HGB 13.8 05/15/2018   HCT 42.7 05/15/2018   PLT 196 05/15/2018   GLUCOSE 127 (H) 05/15/2018   ALT 28 05/15/2018   AST 27 05/15/2018   NA 140 05/15/2018   K 3.7 05/15/2018   CL 106 05/15/2018   CREATININE 0.81 05/15/2018   BUN 15 05/15/2018   CO2 26 05/15/2018   TSH 1.43 04/15/2017   INR 1.11 04/29/2018   Pulmonary function studies March 2019 FEV1 1.6 56%  predicted on spirometry  Repeat done several weeks later FEV1 2.0 71% DLCO 19.5    63% predicted  Current PFTs show FEV1 of 1.82 63% of predicted DLCO 14.23 59% of predicted  Assessment / Plan:   #1 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 Dr. Lovena Le has given preop clearance Patient is agreeable to proceeding with surgical resection of the pleural-based mass in the right lower lung, we discussed possible wedge or segmentectomy with his limited pulmonary function versus full lobectomy.  The mass is along the pleura there is no evidence of rib destruction.  We discussed possible rib resection if tumor is involved.  The goals risks and alternatives of the planned surgical procedure bronchoscopy right VATS lung resection have been discussed with the patient in detail. The risks of the procedure including death, infection, stroke, myocardial infarction, bleeding, blood transfusion have all been discussed specifically.  I have quoted Aniceto Boss a 3 % of perioperative mortality and a complication rate as high as 40 %. The patient's questions have been answered.HAYTHAM MAHER is willing  to proceed with the planned procedure.  We will plan surgery for Monday, March 23.     [name] MD      [address] [place] [address] [place]   Beeper [phone]  05/27/2018 7:58 PM

## 2018-05-29 NOTE — Pre-Procedure Instructions (Addendum)
George Vega  05/29/2018      [facility] [place], [place] - [place] AT [facility] OF [address] [place] [address] Phone: [phone] Fax: [place], [place] [facility] [address] [place] [address] [place] [address] Phone: [phone] Fax: [phone]    Your procedure is scheduled on Monday, March 23th.  Report to [facility] Entrance "A" Admitting at 5:30 A.M.  Call this number if you have problems the morning of surgery:  [phone]   Remember:  Do not eat or drink after midnight.    Take these medicines the morning of surgery with A SIP OF WATER  amLODipine (NORVASC)  tamsulosin (FLOMAX)  metoprolol tartrate (LOPRESSOR)  dofetilide (TIKOSYN) pantoprazole (PROTONIX)  As needed: acetaminophen (TYLENOL) and eye drops.  Hold Xarelto 24 hours prior to surgery.   As of today, STOP taking any Aspirin (unless otherwise instructed by your surgeon), Aleve, Naproxen, Ibuprofen, Motrin, Advil, Goody's, BC's, all herbal medications, fish oil, and all vitamins.   Do not wear jewelry.  Do not wear lotions, powders, or colognes, or deodorant.  Men may shave face and neck.  Do not bring valuables to the hospital.  [facility] is not responsible for any belongings or valuables.  Contacts, dentures or bridgework may not be worn into surgery.  Leave your suitcase in the car.  After surgery it may be brought to your room.  For patients admitted to the hospital, discharge time will be determined by your treatment team.  Patients discharged the day of surgery will not be allowed to drive home.   Special instructions:   [facility]- Preparing For Surgery  Before surgery, you can play an important role. Because skin is not sterile, your skin needs to be as free of germs as possible. You can reduce the number of germs on your skin by washing with CHG (chlorahexidine  gluconate) Soap before surgery.  CHG is an antiseptic cleaner which kills germs and bonds with the skin to continue killing germs even after washing.    Oral Hygiene is also important to reduce your risk of infection.  Remember - BRUSH YOUR TEETH THE MORNING OF SURGERY WITH YOUR REGULAR TOOTHPASTE  Please do not use if you have an allergy to CHG or antibacterial soaps. If your skin becomes reddened/irritated stop using the CHG.  Do not shave (including legs and underarms) for at least 48 hours prior to first CHG shower. It is OK to shave your face.  Please follow these instructions carefully.   1. Shower the NIGHT BEFORE SURGERY and the MORNING OF SURGERY with CHG.   2. If you chose to wash your hair, wash your hair first as usual with your normal shampoo.  3. After you shampoo, rinse your hair and body thoroughly to remove the shampoo.  4. Use CHG as you would any other liquid soap. You can apply CHG directly to the skin and wash gently with a scrungie or a clean washcloth.   5. Apply the CHG Soap to your body ONLY FROM THE NECK DOWN.  Do not use on open wounds or open sores. Avoid contact with your eyes, ears, mouth and genitals (private parts). Wash Face and genitals (private parts)  with your normal soap.  6. Wash thoroughly, paying special attention to the area where your surgery will be performed.  7. Thoroughly rinse your body  with warm water from the neck down.  8. DO NOT shower/wash with your normal soap after using and rinsing off the CHG Soap.  9. Pat yourself dry with a CLEAN TOWEL.  10. Wear CLEAN PAJAMAS to bed the night before surgery, wear comfortable clothes the morning of surgery  11. Place CLEAN SHEETS on your bed the night of your first shower and DO NOT SLEEP WITH PETS.    Day of Surgery:  Do not apply any deodorants/lotions.  Please wear clean clothes to the hospital/surgery center.   Remember to brush your teeth WITH YOUR REGULAR TOOTHPASTE.   Please  read over the following fact sheets that you were given.

## 2018-05-30 ENCOUNTER — Encounter (HOSPITAL_COMMUNITY): Payer: Self-pay

## 2018-05-30 ENCOUNTER — Other Ambulatory Visit: Payer: Self-pay

## 2018-05-30 ENCOUNTER — Encounter (HOSPITAL_COMMUNITY)
Admission: RE | Admit: 2018-05-30 | Discharge: 2018-05-30 | Disposition: A | Discharge status: Home / Self Care | Payer: Medicare Other | Source: Ambulatory Visit | Attending: Cardiothoracic Surgery | Admitting: Cardiothoracic Surgery

## 2018-05-30 DIAGNOSIS — C3431 Malignant neoplasm of lower lobe, right bronchus or lung: Secondary | ICD-10-CM | POA: Insufficient documentation

## 2018-05-30 DIAGNOSIS — R9431 Abnormal electrocardiogram [ECG] [EKG]: Secondary | ICD-10-CM

## 2018-05-30 DIAGNOSIS — Z01818 Encounter for other preprocedural examination: Secondary | ICD-10-CM | POA: Insufficient documentation

## 2018-05-30 DIAGNOSIS — I700 Atherosclerosis of aorta: Secondary | ICD-10-CM

## 2018-05-30 HISTORY — DX: Cardiac arrhythmia, unspecified: I49.9

## 2018-05-30 LAB — COMPREHENSIVE METABOLIC PANEL
ALT: 26 U/L (ref 0–44)
AST: 31 U/L (ref 15–41)
Albumin: 3.4 g/dL — ABNORMAL LOW (ref 3.5–5.0)
Alkaline Phosphatase: 113 U/L (ref 38–126)
Anion gap: 10 (ref 5–15)
BUN: 12 mg/dL (ref 8–23)
CO2: 24 mmol/L (ref 22–32)
Calcium: 8.7 mg/dL — ABNORMAL LOW (ref 8.9–10.3)
Chloride: 105 mmol/L (ref 98–111)
Creatinine, Ser: 0.88 mg/dL (ref 0.61–1.24)
GFR calc Af Amer: >60 mL/min (ref >60)
GFR calc non Af Amer: >60 mL/min (ref >60)
Glucose, Bld: 127 mg/dL — ABNORMAL HIGH (ref 70–99)
Potassium: 3.7 mmol/L (ref 3.5–5.1)
Sodium: 139 mmol/L (ref 135–145)
Total Bilirubin: 0.7 mg/dL (ref 0.3–1.2)
Total Protein: 6.1 g/dL — ABNORMAL LOW (ref 6.5–8.1)

## 2018-05-30 LAB — BLOOD GAS, ARTERIAL
Acid-Base Excess: 2.6 mmol/L — ABNORMAL HIGH (ref 0.0–2.0)
Bicarbonate: 26.6 mmol/L (ref 20.0–28.0)
Drawn by: 42180
FIO2: 21
O2 Saturation: 95.4 %
Patient temperature: 98.6
pCO2 arterial: 40.8 mmHg (ref 32.0–48.0)
pH, Arterial: 7.43 (ref 7.350–7.450)
pO2, Arterial: 75.3 mmHg — ABNORMAL LOW (ref 83.0–108.0)

## 2018-05-30 LAB — CBC
HCT: 43 % (ref 39.0–52.0)
Hemoglobin: 13.5 g/dL (ref 13.0–17.0)
MCH: 30.5 pg (ref 26.0–34.0)
MCHC: 31.4 g/dL (ref 30.0–36.0)
MCV: 97.1 fL (ref 80.0–100.0)
Platelets: 195 10*3/uL (ref 150–400)
RBC: 4.43 MIL/uL (ref 4.22–5.81)
RDW: 12.9 % (ref 11.5–15.5)
WBC: 8.5 10*3/uL (ref 4.0–10.5)
nRBC: 0 % (ref 0.0–0.2)

## 2018-05-30 LAB — URINALYSIS, ROUTINE W REFLEX MICROSCOPIC
Bilirubin Urine: NEGATIVE
Glucose, UA: NEGATIVE mg/dL
Hgb urine dipstick: NEGATIVE
Ketones, ur: NEGATIVE mg/dL
Nitrite: NEGATIVE
Protein, ur: NEGATIVE mg/dL
Specific Gravity, Urine: 1.024 (ref 1.005–1.030)
pH: 5 (ref 5.0–8.0)

## 2018-05-30 LAB — TYPE AND SCREEN
ABO/RH(D): O NEG
Antibody Screen: NEGATIVE

## 2018-05-30 LAB — SURGICAL PCR SCREEN
MRSA, PCR: NEGATIVE
Staphylococcus aureus: NEGATIVE

## 2018-05-30 NOTE — Progress Notes (Signed)
Notified Dr. Everrett Coombe assistant on pt's UA results.

## 2018-05-30 NOTE — Progress Notes (Signed)
PCP - Dr. Lennette Bihari [facility] Cardiologist - Dr. Crissie Sickles  Chest x-ray -05/30/18  EKG - 05/30/18 Stress Test - 03/16/16 ECHO - 04/17/17 Cardiac Cath -06/14/11-Dr. Einar Gip   Sleep Study - 04/09/17-OSA+, pt states "It was negative" CPAP - denies use.   Blood Thinner Instructions:Pt instructed to hold 48 hours prior to surgery. LD  Aspirin Instructions:N/A  Anesthesia review: Yes, hx of CAD and difficult intubation.   Patient denies shortness of breath, fever, cough and chest pain at PAT appointment   Patient verbalized understanding of instructions that were given to them at the PAT appointment. Patient was also instructed that they will need to review over the PAT instructions again at home before surgery.

## 2018-05-30 NOTE — Anesthesia Preprocedure Evaluation (Addendum)
Anesthesia Evaluation  Patient identified by MRN, date of birth, ID band Patient awake    Reviewed: Allergy & Precautions, NPO status , Patient's Chart, lab work & pertinent test results, reviewed documented beta blocker date and time   Airway Mallampati: II  TM Distance: >3 FB Neck ROM: Full    Dental  (+) Dental Advisory Given   Pulmonary former smoker,  RLL CA   breath sounds clear to auscultation       Cardiovascular hypertension, Pt. on medications and Pt. on home beta blockers + CAD  + dysrhythmias Atrial Fibrillation  Rhythm:Regular Rate:Normal     Neuro/Psych CVA    GI/Hepatic negative GI ROS, Neg liver ROS,   Endo/Other  negative endocrine ROS  Renal/GU negative Renal ROS     Musculoskeletal  (+) Arthritis ,   Abdominal   Peds  Hematology negative hematology ROS (+)   Anesthesia Other Findings   Reproductive/Obstetrics                           Lab Results  Component Value Date   WBC 8.5 05/30/2018   HGB 13.5 05/30/2018   HCT 43.0 05/30/2018   MCV 97.1 05/30/2018   PLT 195 05/30/2018   Lab Results  Component Value Date   CREATININE 0.88 05/30/2018   BUN 12 05/30/2018   NA 139 05/30/2018   K 3.7 05/30/2018   CL 105 05/30/2018   CO2 24 05/30/2018   -TTE 04/17/17 shows EF 60-65%, normal wall motion, mod LVH, grade 2 dd, mild MR, mild-mod TR, PA systolic pressure mod increased. -Nuclear stress 01/14/17 showed Small size, moderate intensity fixed apical/apical septal perfusion defect, suggestive of apical thinning or artifact. No reversible ischemia. LVEF 56% with normal wall motion. This is a low risk study.  Follows with pulmonology for DOE and monitoring of lung nodules. Spirometry 05/26/2018 showed: FVC-%Pred-Pre: 60 FEV1-%Pred-Pre: 63 FEV1/FVC ratio: 75 DLCO cor % pred: 60  Anesthesia Physical Anesthesia Plan  ASA: III  Anesthesia Plan: General   Post-op Pain  Management:    Induction: Intravenous  PONV Risk Score and Plan: 2 and Dexamethasone and Ondansetron  Airway Management Planned: Double Lumen EBT  Additional Equipment: Arterial line, CVP and Ultrasound Guidance Line Placement  Intra-op Plan:   Post-operative Plan: Extubation in OR and Possible Post-op intubation/ventilation  Informed Consent: I have reviewed the patients History and Physical, chart, labs and discussed the procedure including the risks, benefits and alternatives for the proposed anesthesia with the patient or authorized representative who has indicated his/her understanding and acceptance.     Dental advisory given  Plan Discussed with: CRNA  Anesthesia Plan Comments: (    )      Anesthesia Quick Evaluation

## 2018-05-30 NOTE — Progress Notes (Deleted)
   05/30/18 0818  OBSTRUCTIVE SLEEP APNEA  Have you ever been diagnosed with sleep apnea through a sleep study? No (has been tested and was negative for OSA)  Do you snore loudly (loud enough to be heard through closed doors)?  1  Do you often feel tired, fatigued, or sleepy during the daytime (such as falling asleep during driving or talking to someone)? 0  Has anyone observed you stop breathing during your sleep? 0  Do you have, or are you being treated for high blood pressure? 1  BMI more than 35 kg/m2? 0  Age > 50 (1-yes) 1  Neck circumference greater than:Male 16 inches or larger, Male 17inches or larger? 1  Male Gender (Yes=1) 1  Obstructive Sleep Apnea Score 5  Score 5 or greater  Results sent to PCP

## 2018-06-02 ENCOUNTER — Other Ambulatory Visit: Payer: Self-pay

## 2018-06-02 ENCOUNTER — Encounter (HOSPITAL_COMMUNITY): Payer: Self-pay

## 2018-06-02 ENCOUNTER — Inpatient Hospital Stay (HOSPITAL_COMMUNITY)
Admission: RE | Admit: 2018-06-02 | Discharge: 2018-06-06 | DRG: 164 | Disposition: A | Discharge status: Home / Self Care | Payer: Medicare Other | Attending: Cardiothoracic Surgery | Admitting: Cardiothoracic Surgery

## 2018-06-02 ENCOUNTER — Inpatient Hospital Stay (HOSPITAL_COMMUNITY): Payer: Medicare Other

## 2018-06-02 ENCOUNTER — Encounter (HOSPITAL_COMMUNITY)
Admission: RE | Disposition: A | Discharge status: Home / Self Care | Payer: Self-pay | Source: Home / Self Care | Attending: Cardiothoracic Surgery

## 2018-06-02 ENCOUNTER — Inpatient Hospital Stay (HOSPITAL_COMMUNITY): Payer: Medicare Other | Admitting: Certified Registered Nurse Anesthetist

## 2018-06-02 ENCOUNTER — Inpatient Hospital Stay (HOSPITAL_COMMUNITY): Payer: Medicare Other | Admitting: Physician Assistant

## 2018-06-02 DIAGNOSIS — C3431 Malignant neoplasm of lower lobe, right bronchus or lung: Secondary | ICD-10-CM

## 2018-06-02 DIAGNOSIS — Z4682 Encounter for fitting and adjustment of non-vascular catheter: Secondary | ICD-10-CM

## 2018-06-02 DIAGNOSIS — D500 Iron deficiency anemia secondary to blood loss (chronic): Secondary | ICD-10-CM | POA: Diagnosis present

## 2018-06-02 DIAGNOSIS — Z888 Allergy status to other drugs, medicaments and biological substances status: Secondary | ICD-10-CM

## 2018-06-02 DIAGNOSIS — Z8679 Personal history of other diseases of the circulatory system: Secondary | ICD-10-CM

## 2018-06-02 DIAGNOSIS — Z886 Allergy status to analgesic agent status: Secondary | ICD-10-CM | POA: Diagnosis not present

## 2018-06-02 DIAGNOSIS — J939 Pneumothorax, unspecified: Secondary | ICD-10-CM

## 2018-06-02 DIAGNOSIS — Z8249 Family history of ischemic heart disease and other diseases of the circulatory system: Secondary | ICD-10-CM

## 2018-06-02 DIAGNOSIS — Z800 Family history of malignant neoplasm of digestive organs: Secondary | ICD-10-CM

## 2018-06-02 DIAGNOSIS — I10 Essential (primary) hypertension: Secondary | ICD-10-CM | POA: Diagnosis present

## 2018-06-02 DIAGNOSIS — C3490 Malignant neoplasm of unspecified part of unspecified bronchus or lung: Secondary | ICD-10-CM | POA: Diagnosis present

## 2018-06-02 DIAGNOSIS — I4819 Other persistent atrial fibrillation: Secondary | ICD-10-CM | POA: Diagnosis present

## 2018-06-02 DIAGNOSIS — Z7901 Long term (current) use of anticoagulants: Secondary | ICD-10-CM | POA: Diagnosis not present

## 2018-06-02 DIAGNOSIS — Z87891 Personal history of nicotine dependence: Secondary | ICD-10-CM | POA: Diagnosis not present

## 2018-06-02 DIAGNOSIS — Q2733 Arteriovenous malformation of digestive system vessel: Secondary | ICD-10-CM | POA: Diagnosis not present

## 2018-06-02 DIAGNOSIS — J329 Chronic sinusitis, unspecified: Secondary | ICD-10-CM | POA: Diagnosis present

## 2018-06-02 DIAGNOSIS — Z79899 Other long term (current) drug therapy: Secondary | ICD-10-CM | POA: Diagnosis not present

## 2018-06-02 DIAGNOSIS — I2510 Atherosclerotic heart disease of native coronary artery without angina pectoris: Secondary | ICD-10-CM | POA: Diagnosis present

## 2018-06-02 DIAGNOSIS — Z8673 Personal history of transient ischemic attack (TIA), and cerebral infarction without residual deficits: Secondary | ICD-10-CM

## 2018-06-02 DIAGNOSIS — Z887 Allergy status to serum and vaccine status: Secondary | ICD-10-CM

## 2018-06-02 HISTORY — PX: VIDEO ASSISTED THORACOSCOPY (VATS)/WEDGE RESECTION: SHX6174

## 2018-06-02 LAB — GLUCOSE, CAPILLARY: Glucose-Capillary: 151 mg/dL — ABNORMAL HIGH (ref 70–99)

## 2018-06-02 LAB — PROTIME-INR
INR: 1.1 (ref 0.8–1.2)
Prothrombin Time: 14 seconds (ref 11.4–15.2)

## 2018-06-02 LAB — APTT: aPTT: 36 seconds (ref 24–36)

## 2018-06-02 SURGERY — VIDEO ASSISTED THORACOSCOPY (VATS)/WEDGE RESECTION
Anesthesia: General | Site: Chest | Laterality: Right

## 2018-06-02 MED ORDER — PHENYLEPHRINE 40 MCG/ML (10ML) SYRINGE FOR IV PUSH (FOR BLOOD PRESSURE SUPPORT)
PREFILLED_SYRINGE | INTRAVENOUS | Status: AC
  Filled 2018-06-02: qty 10

## 2018-06-02 MED ORDER — ONDANSETRON HCL 4 MG/2ML IJ SOLN
INTRAMUSCULAR | Status: DC | PRN
  Administered 2018-06-02: 4 mg via INTRAVENOUS

## 2018-06-02 MED ORDER — BUPIVACAINE HCL (PF) 0.5 % IJ SOLN
INTRAMUSCULAR | Status: DC | PRN
  Administered 2018-06-02: 30 mL

## 2018-06-02 MED ORDER — ALBUMIN HUMAN 5 % IV SOLN
INTRAVENOUS | Status: AC
  Filled 2018-06-02: qty 250

## 2018-06-02 MED ORDER — MELATONIN 3 MG PO TABS
9.0000 mg | ORAL_TABLET | Freq: Every evening | ORAL | Status: DC | PRN
  Administered 2018-06-03 – 2018-06-04 (×3): 9 mg via ORAL
  Filled 2018-06-02 (×4): qty 3

## 2018-06-02 MED ORDER — BISACODYL 5 MG PO TBEC
10.0000 mg | DELAYED_RELEASE_TABLET | Freq: Every day | ORAL | Status: DC
  Administered 2018-06-04 – 2018-06-05 (×2): 10 mg via ORAL
  Filled 2018-06-02 (×3): qty 2

## 2018-06-02 MED ORDER — ALBUTEROL SULFATE (2.5 MG/3ML) 0.083% IN NEBU: 2.5000 mg | INHALATION_SOLUTION | Freq: Four times a day (QID) | RESPIRATORY_TRACT | Status: DC | PRN

## 2018-06-02 MED ORDER — MAGNESIUM OXIDE 400 (241.3 MG) MG PO TABS
400.0000 mg | ORAL_TABLET | Freq: Every day | ORAL | Status: DC
  Administered 2018-06-03 – 2018-06-06 (×4): 400 mg via ORAL
  Filled 2018-06-02 (×4): qty 1

## 2018-06-02 MED ORDER — METOPROLOL TARTRATE 12.5 MG HALF TABLET
12.5000 mg | ORAL_TABLET | Freq: Two times a day (BID) | ORAL | Status: DC
  Administered 2018-06-02 – 2018-06-03 (×2): 12.5 mg via ORAL
  Filled 2018-06-02 (×4): qty 1

## 2018-06-02 MED ORDER — ALBUMIN HUMAN 5 % IV SOLN
12.5000 g | Freq: Once | INTRAVENOUS | Status: AC
  Administered 2018-06-02: 12.5 g via INTRAVENOUS

## 2018-06-02 MED ORDER — OXYCODONE HCL 5 MG PO TABS
5.0000 mg | ORAL_TABLET | ORAL | Status: DC | PRN
  Administered 2018-06-03 – 2018-06-05 (×3): 5 mg via ORAL
  Filled 2018-06-02 (×4): qty 1

## 2018-06-02 MED ORDER — ROSUVASTATIN CALCIUM 5 MG PO TABS
5.0000 mg | ORAL_TABLET | Freq: Every day | ORAL | Status: DC
  Administered 2018-06-03 – 2018-06-06 (×4): 5 mg via ORAL
  Filled 2018-06-02 (×4): qty 1

## 2018-06-02 MED ORDER — FENTANYL 40 MCG/ML IV SOLN
INTRAVENOUS | Status: DC
  Administered 2018-06-02: 125 ug via INTRAVENOUS
  Administered 2018-06-02: 140 ug via INTRAVENOUS
  Administered 2018-06-02 – 2018-06-03 (×2): 1000 ug via INTRAVENOUS
  Administered 2018-06-03: 50 ug via INTRAVENOUS
  Administered 2018-06-03: 100 ug via INTRAVENOUS
  Administered 2018-06-03: 190 ug via INTRAVENOUS
  Administered 2018-06-04: 0 ug via INTRAVENOUS
  Administered 2018-06-04: 10 ug via INTRAVENOUS
  Administered 2018-06-04: 40 ug via INTRAVENOUS
  Administered 2018-06-04: 0 ug via INTRAVENOUS
  Administered 2018-06-04: 50 ug via INTRAVENOUS
  Administered 2018-06-04: 180 ug via INTRAVENOUS
  Administered 2018-06-04: 0 ug via INTRAVENOUS
  Administered 2018-06-05: 80 ug via INTRAVENOUS
  Administered 2018-06-05: 30 ug via INTRAVENOUS
  Filled 2018-06-02 (×2): qty 1000

## 2018-06-02 MED ORDER — ROCURONIUM BROMIDE 50 MG/5ML IV SOSY
PREFILLED_SYRINGE | INTRAVENOUS | Status: AC
  Filled 2018-06-02: qty 5

## 2018-06-02 MED ORDER — ONDANSETRON HCL 4 MG/2ML IJ SOLN: 4.0000 mg | Freq: Four times a day (QID) | INTRAMUSCULAR | Status: DC | PRN

## 2018-06-02 MED ORDER — SODIUM CHLORIDE 0.9% FLUSH: 9.0000 mL | INTRAVENOUS | Status: DC | PRN

## 2018-06-02 MED ORDER — ALBUTEROL SULFATE (2.5 MG/3ML) 0.083% IN NEBU
INHALATION_SOLUTION | RESPIRATORY_TRACT | Status: AC
  Filled 2018-06-02: qty 3

## 2018-06-02 MED ORDER — ONDANSETRON HCL 4 MG/2ML IJ SOLN
INTRAMUSCULAR | Status: AC
  Filled 2018-06-02: qty 2

## 2018-06-02 MED ORDER — PHENYLEPHRINE HCL 10 MG/ML IJ SOLN
INTRAMUSCULAR | Status: DC | PRN
  Administered 2018-06-02 (×3): 80 ug via INTRAVENOUS
  Administered 2018-06-02 (×2): 120 ug via INTRAVENOUS
  Administered 2018-06-02: 80 ug via INTRAVENOUS
  Administered 2018-06-02 (×4): 120 ug via INTRAVENOUS
  Administered 2018-06-02: 80 ug via INTRAVENOUS

## 2018-06-02 MED ORDER — ACETAMINOPHEN 160 MG/5ML PO SOLN: 1000.0000 mg | Freq: Four times a day (QID) | ORAL | Status: DC

## 2018-06-02 MED ORDER — ROCURONIUM BROMIDE 50 MG/5ML IV SOSY
PREFILLED_SYRINGE | INTRAVENOUS | Status: DC | PRN
  Administered 2018-06-02 (×2): 50 mg via INTRAVENOUS

## 2018-06-02 MED ORDER — EPHEDRINE SULFATE 50 MG/ML IJ SOLN
INTRAMUSCULAR | Status: DC | PRN
  Administered 2018-06-02: 5 mg via INTRAVENOUS

## 2018-06-02 MED ORDER — FUROSEMIDE 40 MG PO TABS
40.0000 mg | ORAL_TABLET | Freq: Every day | ORAL | Status: DC
  Administered 2018-06-02: 40 mg via ORAL
  Filled 2018-06-02: qty 1

## 2018-06-02 MED ORDER — PRESERVISION AREDS 2 PO CAPS: 1.0000 | ORAL_CAPSULE | Freq: Two times a day (BID) | ORAL | Status: DC

## 2018-06-02 MED ORDER — MIDAZOLAM HCL 2 MG/2ML IJ SOLN
INTRAMUSCULAR | Status: DC | PRN
  Administered 2018-06-02: 2 mg via INTRAVENOUS

## 2018-06-02 MED ORDER — ACETAMINOPHEN 500 MG PO TABS
1000.0000 mg | ORAL_TABLET | Freq: Four times a day (QID) | ORAL | Status: DC
  Administered 2018-06-02 – 2018-06-06 (×14): 1000 mg via ORAL
  Filled 2018-06-02 (×16): qty 2

## 2018-06-02 MED ORDER — SUCCINYLCHOLINE CHLORIDE 200 MG/10ML IV SOSY
PREFILLED_SYRINGE | INTRAVENOUS | Status: AC
  Filled 2018-06-02: qty 10

## 2018-06-02 MED ORDER — NALOXONE HCL 0.4 MG/ML IJ SOLN: 0.4000 mg | INTRAMUSCULAR | Status: DC | PRN

## 2018-06-02 MED ORDER — SODIUM CHLORIDE (PF) 0.9 % IJ SOLN
INTRAMUSCULAR | Status: DC | PRN
  Administered 2018-06-02: 50 mL via INTRAVENOUS

## 2018-06-02 MED ORDER — LIDOCAINE 2% (20 MG/ML) 5 ML SYRINGE
INTRAMUSCULAR | Status: AC
  Filled 2018-06-02: qty 5

## 2018-06-02 MED ORDER — AMLODIPINE BESYLATE 5 MG PO TABS: 5.0000 mg | ORAL_TABLET | Freq: Every day | ORAL | Status: DC

## 2018-06-02 MED ORDER — BUPIVACAINE HCL (PF) 0.5 % IJ SOLN
INTRAMUSCULAR | Status: AC
  Filled 2018-06-02: qty 30

## 2018-06-02 MED ORDER — ONDANSETRON HCL 4 MG/2ML IJ SOLN: 4.0000 mg | Freq: Once | INTRAMUSCULAR | Status: DC | PRN

## 2018-06-02 MED ORDER — MIDAZOLAM HCL 2 MG/2ML IJ SOLN
INTRAMUSCULAR | Status: AC
  Filled 2018-06-02: qty 2

## 2018-06-02 MED ORDER — OCUVITE-LUTEIN PO CAPS
1.0000 | ORAL_CAPSULE | Freq: Every day | ORAL | Status: DC
  Filled 2018-06-02: qty 1

## 2018-06-02 MED ORDER — FENTANYL CITRATE (PF) 250 MCG/5ML IJ SOLN
INTRAMUSCULAR | Status: AC
  Filled 2018-06-02: qty 5

## 2018-06-02 MED ORDER — FENTANYL CITRATE (PF) 100 MCG/2ML IJ SOLN
INTRAMUSCULAR | Status: AC
  Filled 2018-06-02: qty 2

## 2018-06-02 MED ORDER — TRAMADOL HCL 50 MG PO TABS
50.0000 mg | ORAL_TABLET | Freq: Four times a day (QID) | ORAL | Status: DC | PRN
  Administered 2018-06-03 – 2018-06-04 (×2): 50 mg via ORAL
  Filled 2018-06-02 (×2): qty 1

## 2018-06-02 MED ORDER — 0.9 % SODIUM CHLORIDE (POUR BTL) OPTIME
TOPICAL | Status: DC | PRN
  Administered 2018-06-02: 1000 mL

## 2018-06-02 MED ORDER — EPHEDRINE 5 MG/ML INJ
INTRAVENOUS | Status: AC
  Filled 2018-06-02: qty 10

## 2018-06-02 MED ORDER — PROPOFOL 10 MG/ML IV BOLUS
INTRAVENOUS | Status: AC
  Filled 2018-06-02: qty 40

## 2018-06-02 MED ORDER — HEMOSTATIC AGENTS (NO CHARGE) OPTIME
TOPICAL | Status: DC | PRN
  Administered 2018-06-02: 1 via TOPICAL

## 2018-06-02 MED ORDER — CEFAZOLIN SODIUM-DEXTROSE 2-4 GM/100ML-% IV SOLN
2.0000 g | INTRAVENOUS | Status: AC
  Administered 2018-06-02: 2 g via INTRAVENOUS
  Filled 2018-06-02: qty 100

## 2018-06-02 MED ORDER — SENNOSIDES-DOCUSATE SODIUM 8.6-50 MG PO TABS
1.0000 | ORAL_TABLET | Freq: Every day | ORAL | Status: DC
  Filled 2018-06-02 (×3): qty 1

## 2018-06-02 MED ORDER — SUGAMMADEX SODIUM 200 MG/2ML IV SOLN
INTRAVENOUS | Status: DC | PRN
  Administered 2018-06-02: 193.8 mg via INTRAVENOUS

## 2018-06-02 MED ORDER — ALBUTEROL SULFATE (2.5 MG/3ML) 0.083% IN NEBU
2.5000 mg | INHALATION_SOLUTION | Freq: Once | RESPIRATORY_TRACT | Status: AC
  Administered 2018-06-02: 2.5 mg via RESPIRATORY_TRACT

## 2018-06-02 MED ORDER — PANTOPRAZOLE SODIUM 40 MG PO TBEC
40.0000 mg | DELAYED_RELEASE_TABLET | Freq: Every day | ORAL | Status: DC
  Administered 2018-06-03 – 2018-06-06 (×4): 40 mg via ORAL
  Filled 2018-06-02 (×4): qty 1

## 2018-06-02 MED ORDER — DIPHENHYDRAMINE HCL 12.5 MG/5ML PO ELIX
12.5000 mg | ORAL_SOLUTION | Freq: Four times a day (QID) | ORAL | Status: DC | PRN
  Filled 2018-06-02: qty 5

## 2018-06-02 MED ORDER — DEXAMETHASONE SODIUM PHOSPHATE 10 MG/ML IJ SOLN
INTRAMUSCULAR | Status: AC
  Filled 2018-06-02: qty 1

## 2018-06-02 MED ORDER — PROSIGHT PO TABS
1.0000 | ORAL_TABLET | Freq: Every day | ORAL | Status: DC
  Administered 2018-06-03 – 2018-06-06 (×4): 1 via ORAL
  Filled 2018-06-02 (×4): qty 1

## 2018-06-02 MED ORDER — PROPOFOL 10 MG/ML IV BOLUS
INTRAVENOUS | Status: DC | PRN
  Administered 2018-06-02: 200 mg via INTRAVENOUS

## 2018-06-02 MED ORDER — POTASSIUM CHLORIDE IN NACL 20-0.9 MEQ/L-% IV SOLN
INTRAVENOUS | Status: DC
  Administered 2018-06-02: 23:00:00 via INTRAVENOUS
  Filled 2018-06-02 (×2): qty 1000

## 2018-06-02 MED ORDER — POTASSIUM CHLORIDE 10 MEQ/50ML IV SOLN: 10.0000 meq | Freq: Every day | INTRAVENOUS | Status: DC | PRN

## 2018-06-02 MED ORDER — TAMSULOSIN HCL 0.4 MG PO CAPS
0.4000 mg | ORAL_CAPSULE | Freq: Every day | ORAL | Status: DC
  Administered 2018-06-03 – 2018-06-06 (×4): 0.4 mg via ORAL
  Filled 2018-06-02 (×4): qty 1

## 2018-06-02 MED ORDER — FERROUS SULFATE 325 (65 FE) MG PO TABS
325.0000 mg | ORAL_TABLET | Freq: Two times a day (BID) | ORAL | Status: DC
  Administered 2018-06-02 – 2018-06-06 (×8): 325 mg via ORAL
  Filled 2018-06-02 (×8): qty 1

## 2018-06-02 MED ORDER — LACTATED RINGERS IV SOLN
INTRAVENOUS | Status: DC | PRN
  Administered 2018-06-02 (×2): via INTRAVENOUS

## 2018-06-02 MED ORDER — DOFETILIDE 250 MCG PO CAPS
250.0000 ug | ORAL_CAPSULE | Freq: Two times a day (BID) | ORAL | Status: DC
  Administered 2018-06-02 – 2018-06-06 (×8): 250 ug via ORAL
  Filled 2018-06-02 (×8): qty 1

## 2018-06-02 MED ORDER — PAROXETINE HCL 30 MG PO TABS
30.0000 mg | ORAL_TABLET | Freq: Every day | ORAL | Status: DC
  Administered 2018-06-02 – 2018-06-05 (×4): 30 mg via ORAL
  Filled 2018-06-02 (×4): qty 1

## 2018-06-02 MED ORDER — CEFAZOLIN SODIUM-DEXTROSE 2-4 GM/100ML-% IV SOLN
2.0000 g | Freq: Three times a day (TID) | INTRAVENOUS | Status: AC
  Administered 2018-06-02 – 2018-06-03 (×2): 2 g via INTRAVENOUS
  Filled 2018-06-02 (×3): qty 100

## 2018-06-02 MED ORDER — FENTANYL CITRATE (PF) 100 MCG/2ML IJ SOLN
25.0000 ug | INTRAMUSCULAR | Status: DC | PRN
  Administered 2018-06-02 (×2): 25 ug via INTRAVENOUS

## 2018-06-02 MED ORDER — BUPIVACAINE LIPOSOME 1.3 % IJ SUSP
INTRAMUSCULAR | Status: DC | PRN
  Administered 2018-06-02: 40 mL

## 2018-06-02 MED ORDER — FENTANYL CITRATE (PF) 250 MCG/5ML IJ SOLN
INTRAMUSCULAR | Status: DC | PRN
  Administered 2018-06-02: 75 ug via INTRAVENOUS
  Administered 2018-06-02 (×2): 25 ug via INTRAVENOUS
  Administered 2018-06-02: 50 ug via INTRAVENOUS

## 2018-06-02 MED ORDER — DIPHENHYDRAMINE HCL 50 MG/ML IJ SOLN: 12.5000 mg | Freq: Four times a day (QID) | INTRAMUSCULAR | Status: DC | PRN

## 2018-06-02 MED ORDER — DEXAMETHASONE SODIUM PHOSPHATE 10 MG/ML IJ SOLN
INTRAMUSCULAR | Status: DC | PRN
  Administered 2018-06-02: 10 mg via INTRAVENOUS

## 2018-06-02 MED ORDER — INSULIN ASPART 100 UNIT/ML ~~LOC~~ SOLN
0.0000 [IU] | Freq: Four times a day (QID) | SUBCUTANEOUS | Status: DC
  Administered 2018-06-03: 2 [IU] via SUBCUTANEOUS

## 2018-06-02 MED ORDER — BUPIVACAINE LIPOSOME 1.3 % IJ SUSP
20.0000 mL | INTRAMUSCULAR | Status: DC
  Filled 2018-06-02: qty 20

## 2018-06-02 MED ORDER — LIDOCAINE 2% (20 MG/ML) 5 ML SYRINGE
INTRAMUSCULAR | Status: DC | PRN
  Administered 2018-06-02: 60 mg via INTRAVENOUS

## 2018-06-02 MED ORDER — SODIUM CHLORIDE 0.9 % IV SOLN
INTRAVENOUS | Status: DC | PRN
  Administered 2018-06-02: 25 ug/min via INTRAVENOUS

## 2018-06-02 SURGICAL SUPPLY — 101 items
ADAPTER VALVE BIOPSY EBUS (MISCELLANEOUS) IMPLANT
ADH SKN CLS APL DERMABOND .7 (GAUZE/BANDAGES/DRESSINGS) ×2
ADPTR VALVE BIOPSY EBUS (MISCELLANEOUS)
APL SRG 22X2 LUM MLBL SLNT (VASCULAR PRODUCTS)
APL SRG 7X2 LUM MLBL SLNT (VASCULAR PRODUCTS)
APPLICATOR TIP COSEAL (VASCULAR PRODUCTS) IMPLANT
APPLICATOR TIP EXT COSEAL (VASCULAR PRODUCTS) IMPLANT
BAG SPEC RTRVL LRG 6X4 10 (ENDOMECHANICALS) ×2
BRUSH CYTOL CELLEBRITY 1.5X140 (MISCELLANEOUS) IMPLANT
CANISTER SUCT 3000ML PPV (MISCELLANEOUS) ×3 IMPLANT
CATH THORACIC 28FR (CATHETERS) ×3 IMPLANT
CATH THORACIC 36FR (CATHETERS) IMPLANT
CATH THORACIC 36FR RT ANG (CATHETERS) IMPLANT
CLIP VESOCCLUDE MED 6/CT (CLIP) ×3 IMPLANT
CONN ST 1/4X3/8  BEN (MISCELLANEOUS) ×2
CONN ST 1/4X3/8 BEN (MISCELLANEOUS) ×2 IMPLANT
CONT SPEC 4OZ CLIKSEAL STRL BL (MISCELLANEOUS) ×10 IMPLANT
COVER BACK TABLE 60X90IN (DRAPES) ×3 IMPLANT
COVER WAND RF STERILE (DRAPES) ×3 IMPLANT
CUTTER ECHEON FLEX ENDO 45 340 (ENDOMECHANICALS) ×2 IMPLANT
DERMABOND ADVANCED (GAUZE/BANDAGES/DRESSINGS) ×1
DERMABOND ADVANCED .7 DNX12 (GAUZE/BANDAGES/DRESSINGS) ×1 IMPLANT
DISSECTOR BLUNT TIP ENDO 5MM (MISCELLANEOUS) IMPLANT
DRAIN CHANNEL 28F RND 3/8 FF (WOUND CARE) ×3 IMPLANT
DRAIN CHANNEL 32F RND 10.7 FF (WOUND CARE) IMPLANT
DRAPE LAPAROSCOPIC ABDOMINAL (DRAPES) ×3 IMPLANT
DRILL BIT 7/64X5 (BIT) ×2 IMPLANT
ELECT BLADE 4.0 EZ CLEAN MEGAD (MISCELLANEOUS) ×3
ELECT BLADE 6.5 EXT (BLADE) ×3 IMPLANT
ELECT REM PT RETURN 9FT ADLT (ELECTROSURGICAL) ×3
ELECTRODE BLDE 4.0 EZ CLN MEGD (MISCELLANEOUS) ×2 IMPLANT
ELECTRODE REM PT RTRN 9FT ADLT (ELECTROSURGICAL) ×2 IMPLANT
FORCEPS BIOP RJ4 1.8 (CUTTING FORCEPS) IMPLANT
GAUZE SPONGE 4X4 12PLY STRL (GAUZE/BANDAGES/DRESSINGS) ×3 IMPLANT
GAUZE SPONGE 4X4 12PLY STRL LF (GAUZE/BANDAGES/DRESSINGS) ×2 IMPLANT
GLOVE BIO SURGEON STRL SZ 6.5 (GLOVE) ×6 IMPLANT
GLOVE INDICATOR 7.5 STRL GRN (GLOVE) ×2 IMPLANT
GOWN STRL REUS W/ TWL LRG LVL3 (GOWN DISPOSABLE) ×8 IMPLANT
GOWN STRL REUS W/TWL LRG LVL3 (GOWN DISPOSABLE) ×12
KIT BASIN OR (CUSTOM PROCEDURE TRAY) ×3 IMPLANT
KIT CLEAN ENDO COMPLIANCE (KITS) ×3 IMPLANT
KIT SUCTION CATH 14FR (SUCTIONS) ×3 IMPLANT
KIT TURNOVER KIT B (KITS) ×3 IMPLANT
MARKER SKIN DUAL TIP RULER LAB (MISCELLANEOUS) IMPLANT
NDL SPNL 18GX3.5 QUINCKE PK (NEEDLE) ×1 IMPLANT
NEEDLE SPNL 18GX3.5 QUINCKE PK (NEEDLE) ×3 IMPLANT
NS IRRIG 1000ML POUR BTL (IV SOLUTION) ×6 IMPLANT
OIL SILICONE PENTAX (PARTS (SERVICE/REPAIRS)) ×3 IMPLANT
PACK CHEST (CUSTOM PROCEDURE TRAY) ×3 IMPLANT
PAD ARMBOARD 7.5X6 YLW CONV (MISCELLANEOUS) ×9 IMPLANT
PASSER SUT SWANSON 36MM LOOP (INSTRUMENTS) ×3 IMPLANT
POUCH SPECIMEN RETRIEVAL 10MM (ENDOMECHANICALS) ×2 IMPLANT
RELOAD STAPLE 45 GOLD REG/THCK (STAPLE) IMPLANT
SCISSORS LAP 5X35 DISP (ENDOMECHANICALS) IMPLANT
SEALANT PROGEL (MISCELLANEOUS) ×2 IMPLANT
SEALANT SURG COSEAL 4ML (VASCULAR PRODUCTS) IMPLANT
SEALANT SURG COSEAL 8ML (VASCULAR PRODUCTS) IMPLANT
SOLUTION ANTI FOG 6CC (MISCELLANEOUS) ×3 IMPLANT
SPONGE TONSIL TAPE 1 RFD (DISPOSABLE) IMPLANT
STAPLE RELOAD 45MM GOLD (STAPLE) ×24 IMPLANT
STAPLER ECHELON POWERED (MISCELLANEOUS) IMPLANT
STOPCOCK 4 WAY LG BORE MALE ST (IV SETS) ×5 IMPLANT
SUT PROLENE 3 0 SH DA (SUTURE) IMPLANT
SUT PROLENE 4 0 RB 1 (SUTURE)
SUT PROLENE 4-0 RB1 .5 CRCL 36 (SUTURE) IMPLANT
SUT SILK  1 MH (SUTURE) ×4
SUT SILK 1 MH (SUTURE) ×8 IMPLANT
SUT SILK 1 TIES 10X30 (SUTURE) IMPLANT
SUT SILK 2 0 SH (SUTURE) IMPLANT
SUT SILK 2 0SH CR/8 30 (SUTURE) IMPLANT
SUT STEEL 1 (SUTURE) IMPLANT
SUT VIC AB 0 CTX 18 (SUTURE) ×3 IMPLANT
SUT VIC AB 1 CTX 18 (SUTURE) IMPLANT
SUT VIC AB 1 CTX 36 (SUTURE)
SUT VIC AB 1 CTX36XBRD ANBCTR (SUTURE) IMPLANT
SUT VIC AB 2-0 CTX 36 (SUTURE) ×2 IMPLANT
SUT VIC AB 3-0 SH 8-18 (SUTURE) IMPLANT
SUT VIC AB 3-0 X1 27 (SUTURE) ×2 IMPLANT
SUT VICRYL 0 UR6 27IN ABS (SUTURE) IMPLANT
SUT VICRYL 2 TP 1 (SUTURE) ×2 IMPLANT
SYR 20ML ECCENTRIC (SYRINGE) ×3 IMPLANT
SYR 50ML LL SCALE MARK (SYRINGE) ×2 IMPLANT
SYR 5ML LL (SYRINGE) ×3 IMPLANT
SYRINGE 60CC LL (MISCELLANEOUS) ×3 IMPLANT
SYSTEM SAHARA CHEST DRAIN ATS (WOUND CARE) ×3 IMPLANT
TAPE CLOTH SURG 4X10 WHT LF (GAUZE/BANDAGES/DRESSINGS) ×2 IMPLANT
TAPE UMBILICAL COTTON 1/8X30 (MISCELLANEOUS) IMPLANT
TIP APPLICATOR SPRAY EXTEND 16 (VASCULAR PRODUCTS) ×2 IMPLANT
TOWEL GREEN STERILE (TOWEL DISPOSABLE) ×3 IMPLANT
TOWEL GREEN STERILE FF (TOWEL DISPOSABLE) ×3 IMPLANT
TRAP SPECIMEN MUCOUS 40CC (MISCELLANEOUS) IMPLANT
TRAY FOLEY MTR SLVR 16FR STAT (SET/KITS/TRAYS/PACK) ×3 IMPLANT
TROCAR BLADELESS 12MM (ENDOMECHANICALS) IMPLANT
TROCAR XCEL 12X100 BLDLESS (ENDOMECHANICALS) IMPLANT
TROCAR XCEL BLADELESS 5X75MML (TROCAR) ×3 IMPLANT
TUBE CONNECTING 20X1/4 (TUBING) ×3 IMPLANT
TUBING EXTENTION W/L.L. (IV SETS) ×5 IMPLANT
VALVE BIOPSY  SINGLE USE (MISCELLANEOUS) ×1
VALVE BIOPSY SINGLE USE (MISCELLANEOUS) ×2 IMPLANT
VALVE SUCTION BRONCHIO DISP (MISCELLANEOUS) ×3 IMPLANT
WATER STERILE IRR 1000ML POUR (IV SOLUTION) ×3 IMPLANT

## 2018-06-02 NOTE — Anesthesia Procedure Notes (Signed)
Central Venous Catheter Insertion Performed by: Suzette Battiest, MD, anesthesiologist Start/End3/23/2020 6:55 AM, 06/02/2018 7:05 AM Patient location: Pre-op. Preanesthetic checklist: patient identified, IV checked, site marked, risks and benefits discussed, surgical consent, monitors and equipment checked, pre-op evaluation, timeout performed and anesthesia consent Position: Trendelenburg Lidocaine 1% used for infiltration and patient sedated Hand hygiene performed , maximum sterile barriers used  and Seldinger technique used Catheter size: 8 Fr Total catheter length 16. Central line was placed.Double lumen Procedure performed using ultrasound guided technique. Ultrasound Notes:anatomy identified, needle tip was noted to be adjacent to the nerve/plexus identified, no ultrasound evidence of intravascular and/or intraneural injection and image(s) printed for medical record Attempts: 1 Following insertion, dressing applied, line sutured and Biopatch. Post procedure assessment: blood return through all ports  Patient tolerated the procedure well with no immediate complications.

## 2018-06-02 NOTE — Anesthesia Procedure Notes (Signed)
Arterial Line Insertion Start/End3/23/2020 7:00 AM, 06/02/2018 7:03 AM Performed by: Verdie Drown, CRNA, CRNA  Patient location: Pre-op. Preanesthetic checklist: patient identified, IV checked, site marked, risks and benefits discussed, monitors and equipment checked and pre-op evaluation Lidocaine 1% used for infiltration Left, radial was placed Catheter size: 20 G Hand hygiene performed  and maximum sterile barriers used   Attempts: 1 Procedure performed without using ultrasound guided technique. Following insertion, dressing applied and Biopatch. Post procedure assessment: normal  Patient tolerated the procedure well with no immediate complications.

## 2018-06-02 NOTE — Brief Op Note (Addendum)
      [address]       [place] [address]             [phone]      06/02/2018  10:26 AM  PATIENT:  George Vega  78 y.o. 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  PROCEDURE:  RIGHT VIDEO ASSISTED THORACOSCOPY (VATS), RIGHT SUPERIOR SEGMENTECTOMY with lymph node disection and intercostal nerve block  SURGEON:  Surgeon(s) and Role:    [name], MD - Primary  PHYSICIAN ASSISTANT: [name] PA-C  ANESTHESIA:   general  EBL:  100 mL   BLOOD ADMINISTERED:none  DRAINS: 28 French chest tubes placed in the right pleural space   LOCAL MEDICATIONS USED:  Exparel  SPECIMEN:  Source of Specimen:  Right superior segmentectomy, multiple lymph nodes  DISPOSITION OF SPECIMEN:  PATHOLOGY. Second frozen margin negative for cancer  COUNTS CORRECT:  YES  DICTATION: .Dragon Dictation  PLAN OF CARE: Admit to inpatient   PATIENT DISPOSITION:  PACU - hemodynamically stable.   Delay start of Pharmacological VTE agent (>24hrs) due to surgical blood loss or risk of bleeding: yes

## 2018-06-02 NOTE — Anesthesia Procedure Notes (Addendum)
Procedure Name: Intubation Date/Time: 06/02/2018 8:15 AM Performed by: Kathryne Hitch, CRNA Pre-anesthesia Checklist: Patient identified, Emergency Drugs available, Suction available, Patient being monitored and Timeout performed Patient Re-evaluated:Patient Re-evaluated prior to induction Oxygen Delivery Method: Circle system utilized Preoxygenation: Pre-oxygenation with 100% oxygen Induction Type: IV induction Laryngoscope Size: Glidescope (MAC 4 x1 CRNA, Miller 3 x1 MDA ) Grade View: Grade II Endobronchial tube: Left, Double lumen EBT, EBT position confirmed by fiberoptic bronchoscope and EBT position confirmed by auscultation and 39 Fr Number of attempts: 3 Airway Equipment and Method: Stylet Placement Confirmation: positive ETCO2,  breath sounds checked- equal and bilateral and ETT inserted through vocal cords under direct vision Secured at: 31 cm Tube secured with: Tape Dental Injury: Teeth and Oropharynx as per pre-operative assessment  Difficulty Due To: Difficult Airway- due to anterior larynx

## 2018-06-02 NOTE — H&P (Signed)
[address]       [place] [address]             [id number]                    Mackie J Manning [facility] Medical Record #[id number] Date of Birth: December 22, 1940  Referring: Tanda Rockers, MD Primary Care: Via, Lennette Bihari, MD Primary Cardiologist: Cristopher Peru, MD  Chief Complaint:   Lung cancer   History of Present Illness:    George Vega 78 y.o. male  Was seen in the office   after follow-up pulmonary functions and cardiac clearance were done to consider surgical resection of a pleural-based right lung lesion.  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  The patient had noticed increasing shortness of breathand was  was referred to Dr. Melvyn Novas,  CT scan of chest was done and a pleural-based right lower lobe lung lesion 1.4 x 2.9 cm and had increased in size from 0.9 x 2.1 cm since February 2019 .  A PET scan was performed on 04/18/2018 which showed a hypermetabolic 2.8 cm posterior lower lobe pulmonary nodule pleural-based was appreciated .  Recent CT needle biopsy of this area confirm squamous cell carcinoma.   the patient has a previous history of atrial fibrillation and A. fib ablation has been followed by George Vega currently on Xarelto 4 years ago he had cardiac catheterization that did not reveal any significant coronary disease  ,He has a history of hypertension atrial fibrillation, osteoarthritis, he had a stroke 7 years ago but without significant sequelae.  He has been followed by Dr. Beryle Beams for history of hemochromatosis.    Family history is significant for his mother with colon cancer father had heart disease, one brother had salivary gland malignancy.  Patient is married has 3 daughters.  He works for [employer] for many years.  Denies definite asbestos exposure.  He did smoke up to 2 packs a day for 35 years but quit approximately 20 years  ago.  Diagnosis SzA [id number] Lung, needle/core biopsy(ies), right lower lobe - SQUAMOUS CELL CARCINOMA. Microscopic Comment Results reported to Dr. Christinia Gully on 04/30/2018. Intradepartmental consultation was obtained (Dr. Tresa Moore). [name] MD  Current Activity/ Functional Status:  Patient is independent with mobility/ambulation, transfers, ADL's, IADL's.   Zubrod Score: At the time of surgery this patients most appropriate activity status/level should be described as: []     0    Normal activity, no symptoms [x]     1    Restricted in physical strenuous activity but ambulatory, able to do out light work []     2    Ambulatory and capable of self care, unable to do work activities, up and about               >50 % of waking hours                              []     3    Only limited self care, in bed greater than 50% of waking hours []     4    Completely disabled, no self care, confined to bed or chair []     5    Moribund   Past Medical History:  Diagnosis Date   Arthritis  Atrial fibrillation, persistent    Chronic sinusitis    Coronary artery disease    Depression    Difficult intubation    was told with shoulder done 2006-alittle narrow   Dysrhythmia    A. Fib   Gait disorder 05/28/2014   Hypertension    Jejunostomy tube fell out    when asked about this in 04/2017, pt denied ever having a J tube, feeding tube, tubes placed post surgery so ??? veracity of a previous J tube.     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   Spastic colon    Squamous cell carcinoma of lung, stage I, right ([facility]) 05/07/2018   bx 04/29/18; isolated PET uptake in RLL mass   Stroke (cerebrum) ([facility])    SVT (supraventricular tachycardia) ([facility])     Past Surgical History:  Procedure Laterality Date   CARDIAC CATHETERIZATION     CATARACT EXTRACTION, BILATERAL     COLONOSCOPY WITH PROPOFOL N/A 04/17/2017   Procedure: COLONOSCOPY WITH PROPOFOL;  Surgeon:  Yetta Flock, MD;  Location: [facility];  Service: Gastroenterology;  Laterality: N/A;   ESOPHAGEAL DILATION  2017   ESOPHAGOGASTRODUODENOSCOPY (EGD) WITH PROPOFOL N/A 04/17/2017   Procedure: ESOPHAGOGASTRODUODENOSCOPY (EGD) WITH PROPOFOL;  Surgeon: Yetta Flock, MD;  Location: [facility];  Service: Gastroenterology;  Laterality: N/A;   EYE SURGERY     FOOT NEUROMA SURGERY  2002   LEFT HEART CATHETERIZATION WITH CORONARY ANGIOGRAM Right 06/14/2011   20% LM, chronic occluded mid LAD, 50% ostial LCX, 20% mid RI, RCA with collaterals to mid LAD, mid 10% stenosis, EF 60% 06/14/11   NASAL SINUS SURGERY     SHOULDER ARTHROSCOPY  03/01/2011   Procedure: ARTHROSCOPY SHOULDER;  Surgeon: Ninetta Lights, MD;  Location: [facility];  Service: Orthopedics;  Laterality: Right;  arthroscopy shoulder decompression subacromial partial acromioplasty with coracoacromial release, distal claviculectomy, debridement of labrium   SHOULDER ARTHROSCOPY  03/01/2011   Procedure: ARTHROSCOPY SHOULDER;  Surgeon: Ninetta Lights, MD;  Location: [facility];  Service: Orthopedics;  Laterality: Right;  arthroscopy shoulder decompression subacromial partial acromioplasty with coracoacromial release, distal claviculectomy, debridement of labrium   TEE WITHOUT CARDIOVERSION N/A 03/19/2016   Procedure: TRANSESOPHAGEAL ECHOCARDIOGRAM (TEE);  Surgeon: Larey Dresser, MD;  Location: [facility];  Service: Cardiovascular;  Laterality: N/A;   TOTAL KNEE ARTHROPLASTY Right 09/23/2013   Procedure: TOTAL KNEE ARTHROPLASTY;  Surgeon: Ninetta Lights, MD;  Location: [facility];  Service: Orthopedics;  Laterality: Right;   TOTAL SHOULDER ARTHROPLASTY  06/28/2011   Procedure: TOTAL SHOULDER ARTHROPLASTY;  Surgeon: Ninetta Lights, MD;  Location: [facility];  Service: Orthopedics;  Laterality: Right;   UVULOPALATOPHARYNGOPLASTY      Family History  Problem Relation Age of Onset     Cancer Mother        colon   Heart attack Father    Migraines Brother      Social History   Tobacco Use  Smoking Status Former Smoker   Packs/day: 2.00   Years: 30.00   Pack years: 60.00   Types: Cigarettes   Last attempt to quit: 02/27/1998   Years since quitting: 20.2  Smokeless Tobacco Never Used    Social History   Substance and Sexual Activity  Alcohol Use Yes   Alcohol/week: 14.0 standard drinks   Types: 14 Standard drinks or equivalent per week   Comment: 2 drinks daily (bourbon)     Allergies  Allergen Reactions  Glucosamine-Chondroitin Anaphylaxis and Other (See Comments)    Stomach cramps, can't eat    Tetanus Toxoid Anaphylaxis and Hives    REACTION: hives/throat swells shut    Fish Oil Other (See Comments)    Stomach cramps, can't eat    Ibuprofen Nausea Only and Other (See Comments)    Stomach pains    Naproxen Diarrhea    Current Facility-Administered Medications  Medication Dose Route Frequency Provider Last Rate Last Dose   ceFAZolin (ANCEF) IVPB 2g/100 mL premix  2 g Intravenous 30 min Pre-Op [name], MD       Facility-Administered Medications Ordered in Other Encounters  Medication Dose Route Frequency Provider Last Rate Last Dose   fentaNYL (SUBLIMAZE) injection   Intravenous Anesthesia Intra-op Kathryne Hitch, CRNA   25 mcg at 06/02/18 0700   lactated ringers infusion   Intravenous Continuous PRN Kathryne Hitch, CRNA       midazolam (VERSED) injection   Intravenous Anesthesia Intra-op Kathryne Hitch, CRNA   2 mg at 06/02/18 0700    Pertinent items are noted in HPI.   Review of Systems:     Cardiac Review of Systems: [Y] = yes  or   [ N ] = no   Chest Pain [  n  ]  Resting SOB [  n ] Exertional SOB  [ y ]  Orthopnea [ n ]   Pedal Edema [n   ]    Palpitations Blue.Reese  ] Syncope  [ n ]   Presyncope [n   ]   General Review of Systems: [Y] = yes [  ]=no Constitional: recent weight change [   ];  Wt loss over the last 3 months [   ] anorexia [  ]; fatigue [  ]; nausea [  ]; night sweats [  ]; fever [  ]; or chills [  ];           Eye : blurred vision [  ]; diplopia [   ]; vision changes [  ];  Amaurosis fugax[  ]; Resp: cough [  ];  wheezing[  ];  hemoptysis[  ]; shortness of breath[  ]; paroxysmal nocturnal dyspnea[  ]; dyspnea on exertion[ y ]; or orthopnea[  ];  GI:  gallstones[  ], vomiting[  ];  dysphagia[  ]; melena[  ];  hematochezia [  ]; heartburn[  ];   Hx of  Colonoscopy[  ]; GU: kidney stones [  ]; hematuria[  ];   dysuria [  ];  nocturia[  ];  history of     obstruction [  ]; urinary frequency [  ]             Skin: rash, swelling[  ];, hair loss[  ];  peripheral edema[  ];  or itching[  ]; Musculosketetal: myalgias[  ];  joint swelling[  ];  joint erythema[  ];  joint pain[  ];  back pain[  ];  Heme/Lymph: bruising[  ];  bleeding[  ];  anemia[  ];  Neuro: TIA[  ];  headaches[  ];  stroke[  ];  vertigo[  ];  seizures[  ];   paresthesias[  ];  difficulty walking[  ];  Psych:depression[  ]; anxiety[  ];  Endocrine: diabetes[  ];  thyroid dysfunction[  ];  Immunizations: Flu up to date [  ]; Pneumococcal up to date [  ];  Other:     PHYSICAL EXAMINATION: BP (!) 147/79  Pulse 73    Temp 98.5 F (36.9 C) (Oral)    Resp 19    Ht 5\' 9"  (1.753 m)    Wt 96.9 kg    SpO2 97%    BMI 31.54 kg/m  General appearance: alert, cooperative, appears stated age and no distress Head: Normocephalic, without obvious abnormality, atraumatic Neck: no adenopathy, no carotid bruit, no JVD, supple, symmetrical, trachea midline and thyroid not enlarged, symmetric, no tenderness/mass/nodules Lymph nodes: Cervical, supraclavicular, and axillary nodes normal. Resp: clear to auscultation bilaterally Back: symmetric, no curvature. ROM normal. No CVA tenderness. Cardio: regular rate and rhythm, S1, S2 normal, no murmur, click, rub or gallop GI: soft, non-tender; bowel sounds normal; no masses,  no  organomegaly Extremities: extremities normal, atraumatic, no cyanosis or edema Neurologic: Grossly normal  Diagnostic Studies & Laboratory data:     Recent Radiology Findings:   Nm Pet Image Initial (pi) Skull Base To Thigh  Result Date: 04/19/2018 CLINICAL DATA:  Initial treatment strategy for enlarging right lower lobe pulmonary nodule. EXAM: NUCLEAR MEDICINE PET SKULL BASE TO THIGH TECHNIQUE: 10.5 mCi F-18 FDG was injected intravenously. Full-ring PET imaging was performed from the skull base to thigh after the radiotracer. CT data was obtained and used for attenuation correction and anatomic localization. Fasting blood glucose: 109 mg/dl COMPARISON:  04/09/2018 chest CT. FINDINGS: Mediastinal blood pool activity: SUV max 3.1 NECK: No hypermetabolic lymph nodes in the neck. Incidental CT findings: none CHEST: Hypermetabolic 2.8 x 1.4 cm posterior right lower lobe pulmonary nodule with max SUV 10.1 (series 8/image 34). Sub solid 1.3 cm peripheral left upper lobe pulmonary nodule with low level metabolism with max SUV 2.2 (series 8/image 32), new since 04/09/2018 chest CT. No additional hypermetabolic pulmonary findings. No hypermetabolic axillary, mediastinal or hilar lymph nodes. Incidental CT findings: Coronary atherosclerosis. Atherosclerotic nonaneurysmal thoracic aorta. Mild-to-moderate centrilobular and paraseptal emphysema with diffuse bronchial wall thickening. Patchy subpleural reticulation and ground-glass attenuation throughout both lungs, unchanged. ABDOMEN/PELVIS: No abnormal hypermetabolic activity within the liver, pancreas, adrenal glands, or spleen. No hypermetabolic lymph nodes in the abdomen or pelvis. Intense focal hypermetabolism in the right prostate with max SUV 13.0. Incidental CT findings: Mildly irregular liver surface, unchanged, suggesting up attic cirrhosis. Cholelithiasis. Nonobstructing 4 mm upper left renal stone. Exophytic hyperdense subcentimeter renal cortical lesion in  the lower right kidney, which requires no follow-up. Atherosclerotic nonaneurysmal abdominal aorta. Marked sigmoid diverticulosis. SKELETON: No focal hypermetabolic activity to suggest skeletal metastasis. Incidental CT findings: Bilateral total shoulder arthroplasty. IMPRESSION: 1. Hypermetabolic 2.8 cm posterior right lower lobe pulmonary nodule, compatible with primary bronchogenic carcinoma. 2. No hypermetabolic thoracic adenopathy or distant metastatic disease. PET-CT stage IA (T1c N0 M0). 3. Intense focal hypermetabolism in the right prostate, can not exclude prostate malignancy. Recommend correlation with serum PSA and urology consultation. 4. Subsolid 1.3 cm peripheral left upper lobe pulmonary nodule with low level metabolism, new since recent chest CT dated 04/09/2018, more likely inflammatory. Recommend attention on follow-up chest CT in 3 months. 5. Morphologic changes of hepatic cirrhosis. 6. Cholelithiasis. 7. Marked sigmoid diverticulosis. 8. Nonobstructing left nephrolithiasis. 9. Aortic Atherosclerosis (ICD10-I70.0) and Emphysema (ICD10-J43.9). Electronically Signed   By: Ilona Sorrel M.D.   On: 04/19/2018 00:43   Ct Biopsy  Result Date: 04/29/2018 INDICATION: Posterior right lower lobe PET positive nodule EXAM: CT-GUIDED BIOPSY RIGHT LOWER LOBE NODULE MEDICATIONS: 1% lidocaine local ANESTHESIA/SEDATION: 1.0 mg IV Versed; 25 mcg IV Fentanyl Moderate Sedation Time:  11 minutes The patient was continuously monitored during the procedure  by the interventional radiology nurse under my direct supervision. PROCEDURE: The procedure, risks, benefits, and alternatives were explained to the patient. Questions regarding the procedure were encouraged and answered. The patient understands and consents to the procedure. Previous imaging reviewed. Patient positioned right side down decubitus for a posterior approach. Noncontrast localization CT performed. The posterior subpleural right lower lobe nodule was  localized and correlated with the PET-CT. Overlying skin marked. Under sterile conditions and local anesthesia, a 17 gauge coaxial guide needle was advanced to the lesion under CT guidance. Needle position confirmed with CT. 1 cm 18 gauge core biopsies obtained. Samples were intact and non fragmented. These were placed in formalin. Needle tract occluded with the bio sentry device. Postprocedure imaging demonstrates a small amount of surrounding alveolar hemorrhage from the biopsy. No pneumothorax. No effusion. Patient tolerated the procedure well without complication. Vital sign monitoring by nursing staff during the procedure will continue as patient is in the special procedures unit for post procedure observation. FINDINGS: The images document guide needle placement within the posterior right lower lobe lesion. Post biopsy images demonstrate small amount of surrounding hemorrhage. COMPLICATIONS: None immediate. IMPRESSION: Successful CT-guided core biopsy of the right lower lobe nodule Electronically Signed   By: Jerilynn Mages.  Shick M.D.   On: 04/29/2018 12:33   Dg Chest Port 1 View  Result Date: 04/29/2018 CLINICAL DATA:  Status post biopsy of right lower lobe lung nodule. EXAM: PORTABLE CHEST 1 VIEW COMPARISON:  Imaging during CT-guided lung biopsy earlier today. FINDINGS: The heart size is normal. No pneumothorax following lung biopsy. Lungs show evidence of chronic disease. There is no evidence of pulmonary edema, consolidation or pleural fluid. IMPRESSION: No pneumothorax or other acute findings following lung biopsy. Electronically Signed   By: Aletta Edouard M.D.   On: 04/29/2018 13:57     I have independently reviewed the above radiology studies  and reviewed the findings with the patient.   Recent Lab Findings: Lab Results  Component Value Date   WBC 8.5 05/30/2018   HGB 13.5 05/30/2018   HCT 43.0 05/30/2018   PLT 195 05/30/2018   GLUCOSE 127 (H) 05/30/2018   ALT 26 05/30/2018   AST 31 05/30/2018    NA 139 05/30/2018   K 3.7 05/30/2018   CL 105 05/30/2018   CREATININE 0.88 05/30/2018   BUN 12 05/30/2018   CO2 24 05/30/2018   TSH 1.43 04/15/2017   INR 1.1 06/02/2018   Pulmonary function studies March 2019 FEV1 1.6 56% predicted on spirometry  Repeat done several weeks later FEV1 2.0 71% DLCO 19.5    63% predicted  Current PFTs show FEV1 of 1.82 63% of predicted DLCO 14.23 59% of predicted  Assessment / Plan:   #1 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 Dr. Lovena Le has given preop clearance Patient wishes to proceeding with surgical resection of the pleural-based mass in the right lower lung, we discussed possible wedge or segmentectomy with his limited pulmonary function versus full lobectomy.  The mass is along the pleura there is no evidence of rib destruction.  We discussed possible rib resection if tumor is involved. Patient has  had no infectious symptoms or exposure to infected patients.   The goals risks and alternatives of the planned surgical procedure bronchoscopy right VATS lung resection have been discussed with the patient in detail. The risks of the procedure including death, infection, stroke, myocardial infarction, bleeding, blood transfusion have all been discussed specifically.  I have quoted George Vega a 3 % of perioperative mortality and a complication rate as high as 40 %. The patient's questions have been answered.George Vega is willing  to proceed with the planned procedure.      [name] MD      [address] [place] [address] Office [phone]   Beeper [phone]  06/02/2018 7:26 AM

## 2018-06-02 NOTE — Anesthesia Postprocedure Evaluation (Signed)
Anesthesia Post Note  Patient: George Vega  Procedure(s) Performed: VIDEO ASSISTED THORACOSCOPY (VATS)/WEDGE RESECTION with Lymph node disection and intercostal nerve block. (Right Chest)     Patient location during evaluation: PACU Anesthesia Type: General Level of consciousness: awake and alert Pain management: pain level controlled Vital Signs Assessment: post-procedure vital signs reviewed and stable Respiratory status: spontaneous breathing, nonlabored ventilation, respiratory function stable and patient connected to nasal cannula oxygen Cardiovascular status: blood pressure returned to baseline and stable Postop Assessment: no apparent nausea or vomiting Anesthetic complications: no    Last Vitals:  Vitals:   06/02/18 1345 06/02/18 1348  BP:  103/63  Pulse: 75 78  Resp: 19 18  Temp: 36.7 C   SpO2: 100% 100%    Last Pain:  Vitals:   06/02/18 1253  TempSrc:   PainSc: 6                  Tiajuana Amass

## 2018-06-02 NOTE — Transfer of Care (Signed)
Immediate Anesthesia Transfer of Care Note  Patient: George Vega  Procedure(s) Performed: VIDEO ASSISTED THORACOSCOPY (VATS)/WEDGE RESECTION with Lymph node disection and intercostal nerve block. (Right Chest)  Patient Location: PACU  Anesthesia Type:General  Level of Consciousness: awake, alert  and pateint uncooperative  Airway & Oxygen Therapy: Patient Spontanous Breathing and Patient connected to face mask oxygen  Post-op Assessment: Report given to RN and Post -op Vital signs reviewed and stable  Post vital signs: Reviewed and stable  Last Vitals:  Vitals Value Taken Time  BP    Temp    Pulse 84 06/02/2018 11:01 AM  Resp 18 06/02/2018 11:01 AM  SpO2 97 % 06/02/2018 11:01 AM  Vitals shown include unvalidated device data.  Last Pain:  Vitals:   06/02/18 0557  TempSrc: Oral  PainSc: 3       Patients Stated Pain Goal: 3 (40/08/67 6195)  Complications: No apparent anesthesia complications

## 2018-06-03 ENCOUNTER — Inpatient Hospital Stay (HOSPITAL_COMMUNITY): Payer: Medicare Other

## 2018-06-03 ENCOUNTER — Encounter (HOSPITAL_COMMUNITY): Payer: Self-pay | Admitting: Cardiothoracic Surgery

## 2018-06-03 LAB — BASIC METABOLIC PANEL
ANION GAP: 9 (ref 5–15)
BUN: 13 mg/dL (ref 8–23)
CO2: 25 mmol/L (ref 22–32)
Calcium: 8.6 mg/dL — ABNORMAL LOW (ref 8.9–10.3)
Chloride: 105 mmol/L (ref 98–111)
Creatinine, Ser: 1.06 mg/dL (ref 0.61–1.24)
GFR calc Af Amer: >60 mL/min (ref >60)
GFR calc non Af Amer: >60 mL/min (ref >60)
Glucose, Bld: 151 mg/dL — ABNORMAL HIGH (ref 70–99)
POTASSIUM: 4.7 mmol/L (ref 3.5–5.1)
Sodium: 139 mmol/L (ref 135–145)

## 2018-06-03 LAB — GLUCOSE, CAPILLARY
Glucose-Capillary: 129 mg/dL — ABNORMAL HIGH (ref 70–99)
Glucose-Capillary: 132 mg/dL — ABNORMAL HIGH (ref 70–99)
Glucose-Capillary: 152 mg/dL — ABNORMAL HIGH (ref 70–99)

## 2018-06-03 LAB — BLOOD GAS, ARTERIAL
Acid-base deficit: 0.3 mmol/L (ref 0.0–2.0)
Bicarbonate: 24.9 mmol/L (ref 20.0–28.0)
Drawn by: 511551
O2 Content: 2 L/min
O2 Saturation: 90.1 %
PH ART: 7.337 — AB (ref 7.350–7.450)
Patient temperature: 97.6
pCO2 arterial: 47.3 mmHg (ref 32.0–48.0)
pO2, Arterial: 59.7 mmHg — ABNORMAL LOW (ref 83.0–108.0)

## 2018-06-03 LAB — CBC
HCT: 35.8 % — ABNORMAL LOW (ref 39.0–52.0)
Hemoglobin: 10.8 g/dL — ABNORMAL LOW (ref 13.0–17.0)
MCH: 30.3 pg (ref 26.0–34.0)
MCHC: 30.2 g/dL (ref 30.0–36.0)
MCV: 100.3 fL — AB (ref 80.0–100.0)
Platelets: 176 10*3/uL (ref 150–400)
RBC: 3.57 MIL/uL — ABNORMAL LOW (ref 4.22–5.81)
RDW: 13.4 % (ref 11.5–15.5)
WBC: 14.4 10*3/uL — ABNORMAL HIGH (ref 4.0–10.5)
nRBC: 0 % (ref 0.0–0.2)

## 2018-06-03 MED ORDER — SODIUM CHLORIDE 0.9 % IV SOLN
INTRAVENOUS | Status: DC
  Administered 2018-06-03: 09:00:00 via INTRAVENOUS

## 2018-06-03 MED ORDER — FUROSEMIDE 40 MG PO TABS
40.0000 mg | ORAL_TABLET | Freq: Every day | ORAL | Status: DC
  Administered 2018-06-04 – 2018-06-06 (×3): 40 mg via ORAL
  Filled 2018-06-03 (×3): qty 1

## 2018-06-03 NOTE — Discharge Summary (Addendum)
Physician Discharge Summary       [place]       ,[place] [address]             [phone]    Patient ID: George Vega MRN: [id number] DOB/AGE: 78/12/1940 78 y.o.  Admit date: 06/02/2018 Discharge date: 06/06/2018  Admission Diagnoses: Squamous cell carcinoma RLL  Discharge Diagnoses:  1. S/p right VATS, superior segmentectomy, lymph node dissection, and intercostal nerve block 2. History of persistent atrial fibrillation 3. History of CAD 4. History of hypertension 5. History of stroke (cerebrum) ([place]) 6. History of SVT (supraventricular tachycardia) ([facility]) 7. History of gait disorder 8. History of arthritis 9. History of chronic sinusitis 10. History of difficult intubation 11. History of occlusion and stenosis of vertebral artery  Procedure (s):   Right video-assisted thoracoscopy,  superior segmentectomy, right lower lobe with lymph node dissection and intercostal nerve block, right 4, 5, 6 and 7 intercostal spaces by Dr. Servando Snare on 03/23.  Pathology: Diagnosis 1. Lung, wedge biopsy/resection, Right - ADENOSQUAMOUS CARCINOMA, 2.7 CM. - VISCERAL PLEURA INVASION IS PRESENT. - MARGIN OF RESECTION IS NOT INVOLVED. - SEE ONCOLOGY TABLE. 2. Lung, biopsy, Additional margin of right lower lobe superior segment - LUNG PARENCHYMA, NEGATIVE FOR CARCINOMA. 3. Lymph node, biopsy, 4R - ONE LYMPH NODE, NEGATIVE FOR CARCINOMA (0/1). 4. Lymph node, biopsy, 10R - ONE LYMPH NODE, NEGATIVE FOR CARCINOMA (0/1). 5. Lymph node, biopsy, Level 8 - ONE LYMPH NODE, NEGATIVE FOR CARCINOMA (0/1). Pathologic Stage Classification (pTNM, AJCC 8th Edition): pT2a, pN0  History of Presenting Illness: LEANDREW Vega 78 y.o. male  Was seen in the office   after follow-up pulmonary functions and cardiac clearance were done to consider surgical resection of a pleural-based right lung lesion.  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  The patient had noticed increasing shortness of breathand was  was referred to Dr. Melvyn Novas,  CT scan of chest was done and a pleural-based right lower lobe lung lesion 1.4 x 2.9 cm and had increased in size from 0.9 x 2.1 cm since February 2019 .  A PET scan was performed on 04/18/2018 which showed a hypermetabolic 2.8 cm posterior lower lobe pulmonary nodule pleural-based was appreciated .  Recent CT needle biopsy of this area confirm squamous cell carcinoma.   the patient has a previous history of atrial fibrillation and A. fib ablation has been followed by Crissie Sickles currently on Xarelto 4 years ago he had cardiac catheterization that did not reveal any significant coronary disease  He has a history of hypertension atrial fibrillation, osteoarthritis, he had a stroke 7 years ago but without significant sequelae.  He has been followed by Dr. Beryle Beams for history of hemochromatosis.    Family history is significant for his mother with colon cancer father had heart disease, one brother had salivary gland malignancy.  Patient is married has 3 daughters.  He works for [employer] for many years.  Denies definite asbestos exposure.  He did smoke up to 2 packs a day for 35 years but quit approximately 20 years ago.  Dr. Servando Snare discussed with the patient and his wife treatment options including surgical resection versus stereotactic radiotherapy. Dr. Lovena Le has given preop clearance. Patient wishes to proceeding with surgical resection of the pleural-based mass in the right lower lung, we discussed possible wedge or segmentectomy with his limited pulmonary function  versus full lobectomy.  The mass is along the pleura there is no evidence of rib destruction. Dr. Servando Snare discussed the potential risks, benefits, and complications of a right VATS, superior segmentectomy. Patient agreed to proceed with surgery. He was admitted on 06/02/2018 in order  to undergo a right VATS, superior segmentectomy, LN dissection, and intercostal nerve block.  Brief Hospital Course:  The patient remained afebrile and hemodynamically stable. A line and foley were removed early in the post operative course. He was resumed on Tikosyn (history of persistent atrial fibrillation) but because of labile BP, he was not immediately restarted on Lopressor. Xarelto was restarted on 03/27. BP did improve and his heart rate was in the low 100's so he was restarted on Lopressor. Chest tube output gradually decreased. Chest tubes did not have an air leak. Daily chest x rays were obtained and remained stable. Chest tubes were placed to water seal on 03/24. One chest tube was removed on 03/25 and the remaining chest tube was removed on 03/26. Patient initially required several liters of oxygen via [place]. He was later weaned to room air. Patient is tolerating a diet and has had a bowel movement. Wounds are clean and dry. Final chest X ray showed small right pleural effusion, consolidation posterior RML, and no pneumothorax. He will be restarted on Avapro but not be restarted on Amlodipine at this time. He was instructed to follow up with Dr. Lovena Le regarding further titration of BP medications. Dr. Servando Snare instructed patient how to remove chest tube sutures (no office appointment for this secondary to COVID-19) and patient is to remove them on 04/09. As discussed with Dr. Servando Snare, the patient is felt surgically stable for discharge today.   Latest Vital Signs: Blood pressure 118/78, pulse 90, temperature 97.8 F (36.6 C), temperature source Oral, resp. rate (!) 22, height 5\' 9"  (1.753 m), weight 96.9 kg, SpO2 98 %.  Physical Exam: Cardiovascular: RRR Pulmonary: Clear to auscultation on left and slightly diminished basilar breath sounds on the right Abdomen: Soft, non tender, bowel sounds present. Extremities: No lower extremity edema. Wounds: Clean and dry. Very little drainage from  chest tube wounds  Discharge Condition: Stable and discharged to home.  Recent laboratory studies:  Lab Results  Component Value Date   WBC 11.2 (H) 06/04/2018   HGB 10.8 (L) 06/04/2018   HCT 35.3 (L) 06/04/2018   MCV 101.1 (H) 06/04/2018   PLT 154 06/04/2018   Lab Results  Component Value Date   NA 137 06/04/2018   K 4.3 06/04/2018   CL 103 06/04/2018   CO2 28 06/04/2018   CREATININE 0.88 06/04/2018   GLUCOSE 128 (H) 06/04/2018     Diagnostic Studies: Dg Chest 2 View  Result Date: 06/06/2018 CLINICAL DATA:  Chest tube removal. Recent video-assisted thoracoscopy procedure EXAM: CHEST - 2 VIEW COMPARISON:  June 05, 2018 chest CT and PET-CT April 18, 2018 FINDINGS: Right chest tube and central catheter have been removal. No appreciable pneumothorax. There is a small right pleural effusion. There is focal consolidation in the posterior right mid lung region. No new opacity evident. Heart is upper normal in size with pulmonary vascularity normal. No adenopathy. There are total shoulder replacements bilaterally. 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 Calcification is noted in the aortic arch and each carotid artery region. IMPRESSION: No pneumothorax. Central catheter on the right as well as right chest tube have been removed. Consolidation posterior right mid lung with small right pleural effusion noted. No well-defined mass seen in this  area currently. No frank consolidation a. Stable cardiac prominence. Bones osteoporotic. There is aortic atherosclerosis as well as bilateral carotid artery calcification. Electronically Signed   By: Lowella Grip III M.D.   On: 06/06/2018 07:27   Dg Chest 2 View  Result Date: 05/30/2018 CLINICAL DATA:  Lung carcinoma. Preoperative assessment. Cardiac arrhythmia. EXAM: CHEST - 2 VIEW COMPARISON:  PET-CT April 18, 2018; chest radiograph April 29, 2018 FINDINGS: There is mild volume loss and scarring on the right. The pulmonary mass in the right lower  lobe posteriorly seen on CT is not appreciable by radiography. A nodular lesion seen by CT on the left is also not appreciable by radiography. Heart size and pulmonary vascularity are normal. No evident adenopathy. There are total shoulder replacements bilaterally. There is aortic atherosclerosis. IMPRESSION: Mild scarring and volume loss the right. No edema or consolidation. Known pulmonary nodular lesions seen on recent CT are not appreciable by radiography. No adenopathy evident. Aortic Atherosclerosis (ICD10-I70.0). Electronically Signed   By: Lowella Grip III M.D.   On: 05/30/2018 09:11   Dg Chest Port 1 View  Result Date: 06/05/2018 CLINICAL DATA:  Shortness of breath status post lung surgery. EXAM: PORTABLE CHEST 1 VIEW COMPARISON:  Radiograph of June 04, 2018. FINDINGS: Stable cardiomediastinal silhouette. Right internal jugular catheter is unchanged in position. One right-sided chest tube noted on prior exam has been removed. One remains and is unchanged in position. Postsurgical changes and atelectasis are noted in the right middle lobe. No definite pneumothorax is noted currently. No significant pleural effusion is noted. Left lung is unremarkable. Status post bilateral shoulder arthroplasties. IMPRESSION: No pneumothorax seen after removal of 1 right-sided chest tube. One right-sided chest tube remains unchanged in position. Stable atelectasis and postsurgical changes seen in right midlung. Electronically Signed   By: Marijo Conception, M.D.   On: 06/05/2018 08:30   Dg Chest Port 1 View  Result Date: 06/04/2018 CLINICAL DATA:  Post right-sided VATS resection. EXAM: PORTABLE CHEST 1 VIEW COMPARISON:  06/03/2018; 06/02/2018; 05/30/2018; 04/29/2018; chest CT-04/09/2018 FINDINGS: Grossly unchanged cardiac silhouette and mediastinal contours. Stable postsurgical change of the right lung with associated volume loss and perihilar heterogeneous opacities. Stable position of support apparatus. No  pneumothorax. Grossly unchanged left basilar heterogeneous opacities favored to represent atelectasis. No pleural effusion. No evidence of edema. Post bilateral hemi humeral arthroplasty. No definite acute osseous abnormalities. IMPRESSION: 1. Stable positioning of support apparatus.  No pneumothorax. 2. Stable postsurgical change of the right lung with grossly unchanged perihilar and left basilar atelectasis. Electronically Signed   By: Sandi Mariscal M.D.   On: 06/04/2018 08:37   Dg Chest Port 1 View  Result Date: 06/03/2018 CLINICAL DATA:  Shortness of breath EXAM: PORTABLE CHEST 1 VIEW COMPARISON:  06/02/2018 FINDINGS: Cardiac shadow is stable. Right jugular central line as well as to right-sided chest tubes are noted and stable. Postsurgical changes on the right are seen. No pneumothorax is noted. Persistent right perihilar opacity is seen although improved from the prior exam. IMPRESSION: No pneumothorax identified. Slight improved aeration right mid lung Electronically Signed   By: Inez Catalina M.D.   On: 06/03/2018 07:14   Dg Chest Port 1 View  Result Date: 06/02/2018 CLINICAL DATA:  Status post VATS with wedge resection. EXAM: PORTABLE CHEST 1 VIEW COMPARISON:  05/30/2018 FINDINGS: Cardiac shadow is prominent but accentuated by the frontal technique. Aortic calcifications are again seen. Right jugular central line is noted at cavoatrial junction. Two chest tubes are noted on  the right consistent with the recent surgery. Patchy density is noted in the mid and lower lung related to incomplete expansion. No definitive pneumothorax is seen. Left lung remains clear. IMPRESSION: Postoperative change with some atelectatic changes in the right base. No pneumothorax is noted. Electronically Signed   By: Inez Catalina M.D.   On: 06/02/2018 11:02     Discharge Medications: Allergies as of 06/06/2018      Reactions   Glucosamine-chondroitin Anaphylaxis, Other (See Comments)   Stomach cramps, can't eat    Tetanus Toxoid Anaphylaxis, Hives   REACTION: hives/throat swells shut   Fish Oil Other (See Comments)   Stomach cramps, can't eat   Ibuprofen Nausea Only, Other (See Comments)   Stomach pains   Naproxen Diarrhea      Medication List    STOP taking these medications   amLODipine 5 MG tablet Commonly known as:  NORVASC     TAKE these medications   acetaminophen 325 MG tablet Commonly known as:  TYLENOL Take 650 mg by mouth every 6 (six) hours as needed for moderate pain.   dofetilide 250 MCG capsule Commonly known as:  TIKOSYN TAKE 1 CAPSULE BY MOUTH TWO TIMES DAILY What changed:  See the new instructions.   ferrous sulfate 325 (65 FE) MG tablet Commonly known as:  FeroSul Take 1 tablet (325 mg total) by mouth 2 (two) times daily with a meal. What changed:    medication strength  how much to take  when to take this   furosemide 40 MG tablet Commonly known as:  LASIX TAKE 1 TABLET(40 MG) BY MOUTH DAILY What changed:  See the new instructions.   irbesartan 150 MG tablet Commonly known as:  AVAPRO TAKE 1 TABLET(150 MG) BY MOUTH DAILY What changed:  See the new instructions.   magnesium oxide 400 MG tablet Commonly known as:  MAG-OX Take 400 mg by mouth daily.   Melatonin 10 MG Tabs Take 10 mg by mouth at bedtime as needed (sleep).   metoprolol tartrate 25 MG tablet Commonly known as:  LOPRESSOR TAKE 1 TABLET BY MOUTH TWO  TIMES DAILY   oxyCODONE 5 MG immediate release tablet Commonly known as:  Oxy IR/ROXICODONE Take 1 tablet (5 mg total) by mouth every 4 (four) hours as needed for severe pain.   pantoprazole 40 MG tablet Commonly known as:  PROTONIX Take 40 mg by mouth daily.   PARoxetine 30 MG tablet Commonly known as:  PAXIL Take 30 mg by mouth at bedtime.   polycarbophil 625 MG tablet Commonly known as:  FIBERCON Take 1,250 mg by mouth at bedtime.   potassium chloride 10 MEQ tablet Commonly known as:  K-DUR Take 1 tablet (10 mEq total) by  mouth daily.   PRESERVISION AREDS 2 PO Take 1 capsule by mouth 2 (two) times daily.   rosuvastatin 5 MG tablet Commonly known as:  CRESTOR TAKE 1 TABLET BY MOUTH  EVERY MORNING What changed:  when to take this   SYSTANE OP Place 2 drops into both eyes 2 (two) times daily as needed (dry eyes).   tamsulosin 0.4 MG Caps capsule Commonly known as:  FLOMAX Take 0.4 mg by mouth daily.   Vitamin B-12 500 MCG Subl Place 500 mcg under the tongue 3 (three) times a week.   Vitamin D3 125 MCG (5000 UT) Tabs Take 5,000 Units by mouth daily.   Xarelto 15 MG Tabs tablet Generic drug:  Rivaroxaban TAKE 1 TABLET BY MOUTH  DAILY WITH SUPPER  What changed:  See the new instructions.       Follow Up Appointments: Follow-up Information    [name], MD. Go on 06/26/2018.   Specialty:  Cardiothoracic Surgery Why:  PA/LAT CXR to be taken (at [facility] which is in the same building as Dr. Everrett Coombe office, on the ground floor) on 04/16 at 10:30 am;Appointment time is at 11:00 am Contact information: [place] [address] [phone]        Nurse. Go on 06/18/2018.   Why:  Office will call with appointment time. Appointment is with nurse only for chest tube suture removal Contact information: [address]  [place] [address]       Evans Lance, MD. Call.   Specialty:  Cardiology Why:  for an appointment regarding blood pressure medications  Contact information: [address] [place] [address] [phone]           Signed: Sharalyn Ink [facility] 06/06/2018, 9:13 AM

## 2018-06-03 NOTE — Progress Notes (Addendum)
      [address]       [place] [address]             [phone]       1 Day Post-Op Procedure(s) (LRB): VIDEO ASSISTED THORACOSCOPY (VATS)/WEDGE RESECTION with Lymph node disection and intercostal nerve block. (Right)  Subjective: Patient without specific complaints this am. He was inquiring about Tikosyn and Xarelto. I told him Tikosyn already started and will defer to Dr. Servando Snare when to restart Xarelto.  Objective: Vital signs in last 24 hours: Temp:  [97.2 F (36.2 C)-98.1 F (36.7 C)] 97.6 F (36.4 C) (03/24 0750) Pulse Rate:  [72-86] 76 (03/24 0750) Cardiac Rhythm: Normal sinus rhythm (03/24 0750) Resp:  [14-26] 19 (03/24 0750) BP: (72-127)/(43-83) 90/58 (03/24 0750) SpO2:  [88 %-100 %] 92 % (03/24 0750) Arterial Line BP: (78-119)/(38-59) 109/51 (03/23 1549)      Intake/Output from previous day: 03/23 0701 - 03/24 0700 In: 2685.9 [P.O.:237; I.V.:2198.9] Out: 1505 [Urine:930; Blood:100; Chest Tube:475]   Physical Exam:  Cardiovascular: RRR Pulmonary: Clear to auscultation on the left and slightly diminished at right base Abdomen: Soft, non tender, bowel sounds present. Extremities: Trace bilateral lower extremity edema. Wounds: Dressing is clean and dry.   Chest Tubes:to water seal (Dr. Servando Snare did this am), no air leak  Lab Results: CBC: Recent Labs    06/03/18 0440  WBC 14.4*  HGB 10.8*  HCT 35.8*  PLT 176   BMET:  Recent Labs    06/03/18 0440  NA 139  K 4.7  CL 105  CO2 25  GLUCOSE 151*  BUN 13  CREATININE 1.06  CALCIUM 8.6*    PT/INR:  Recent Labs    06/02/18 0616  LABPROT 14.0  INR 1.1   ABG:  INR: Will add last result for INR, ABG once components are confirmed Will add last 4 CBG results once components are confirmed  Assessment/Plan:  1. CV - SR. BP labile (SBP in the 90's) so will not restart Amlodipine yet and will give parameters for Lopressor. Continue Tikosyn 2.  Pulmonary - Chest tube output  475 cc last 24 hours. Chest tubes are to water seal as of this am.There is no air leak. CXR this am shows no pneumothorax. 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 Encourage incentive spirometer 3. Remove a line;will keep foley for now as patient has difficulty with urination and mobility;decrease and change IVF to normal saline 4. Anemia-H and H this am 10.8 and 35.8. Restarted on oral Ferrous bid as has history of anemia 5. Patient tolerating diet, no history of diabetes (CBGs 152/129) will stop accu checks and SS PRN.  Donielle M ZimmermanPA-C 06/03/2018,8:09 AM [phone]  Will wait on xarelto, holding sinus I have seen and examined George Vega and agree with the above assessment  and plan.  [name] MD [facility] [phone] Office [phone] 06/03/2018 12:21 PM

## 2018-06-03 NOTE — Op Note (Signed)
NAME: NIKAI, QUEST MEDICAL RECORD SW:[id number] ACCOUNT [id number] DATE OF BIRTH:Sep 18, 1940 FACILITY: [facility] LOCATION: [facility] PHYSICIAN:Kiara Keep Maryruth Bun, MD  OPERATIVE REPORT  DATE OF PROCEDURE:  06/02/2018  PREOPERATIVE DIAGNOSIS:  Squamous cell carcinoma of the right lower lobe.  POSTOPERATIVE DIAGNOSIS:  Squamous cell carcinoma of the right lower lobe.  SURGICAL PROCEDURES:  Right video-assisted thoracoscopy,  superior segmentectomy, right lower lobe with lymph node dissection and intercostal nerve block, right 4, 5, 6 and 7 intercostal spaces.  SURGEON:  Lanelle Bal, MD  FIRST ASSISTANT:  [name], PA.  BRIEF HISTORY:  The patient is a 78 year old male with somewhat limited pulmonary reserve who had presented with increasing right lower lobe pleural based mass followed by the Pulmonary service.  Ultimately, a needle biopsy was done 2 weeks prior,  confirming squamous cell carcinoma.    The patient has a previous history of atrial fibrillation on Tikosyn.  PFTs were performed and were in the 60% range.  The patient tolerated usual activities without difficulty, but on extreme exertion became short of breath easily.  After discussion  with the patient, he was in favor of proceeding with surgical resection rather than radiation treatment.  PET scan did not indicate any distant spread or mediastinal involvement.  The patient was felt to be stage I.    Risks and options were discussed with him in detail and he was agreeable and signed informed consent.  DESCRIPTION OF PROCEDURE:  With central line and arterial line in place, the patient underwent general endotracheal anesthesia with a double lumen endotracheal tube.  With the endotracheal tube in good position, the patient was placed in the right  decubitus position with the right side up.  Right side had been preoperatively marked right chest was prepped with Betadine, draped in the usual sterile manner.  Right lung  was collapsed.  A small port incision was made along the anterior axillary line  approximately the sixth intercostal space.  This 5 mm scope was introduced into the right chest.  There was still some insufflation of the lungs, but we were able to determine that the mass did not involve the chest wall as potentially as suggested by  the preoperative scan.  We then made a second port incision, slightly more posteriorly and a working incision approximately the 5th intercostal space.  Through the 2 port sites and the working incision, we were able to manipulate the lung and identify  where the mass was.  The mass was located posteriorly, relatively high on the lower lobe involving the superior segment.  We then dissected along the major and minor fissure, freeing up the superior segment of the lower lobe.  At this point, we performed  functionally a superior segmentectomy.  The mass was removed and submitted for pathology.  The malignancy was confirmed as nonsmall-cell cancer.  In addition, the margins were negative.  The closest margin was 1 mm.  We then went back with staplers and  took off an additional new margin along the previous staple line.  While waiting for pathology, we also proceeded with a lymph node dissection, submitting 4R, 10R and level 8 nodes for permanent section.    The followup frozen section showed no evidence of malignancy on the additional margin.  We then put FloSeal along the stapled edges through the 2 port sites.  A Blake drain and a standard chest tube replaced; Argyle tube anteriorly,  Blake drain  posteriorly.  Prior to reinflating the lung, a spinal needle was  used to place a solution of saline, piperocaine and Exparel injections along the fourth, fifth, sixth, seventh intercostal spaces.  The lung was then reinflated.  The working incision was  closed with interrupted 0 Vicryl, running 2-0 Vicryl in the subcutaneous tissue with 3-0 subcuticular stitch in the skin edges.  Dry  dressings were applied.    Sponge and needle count was reported as correct.    There was a very minimal air leak at the completion of the procedure.  Dermabond was placed on this incision.  Sponge and needle count was reported as correct at completion of procedure.  RF scanning showed clear correct.    Estimated blood loss was less than 100 mL.    The patient was extubated in the operating room and transferred to the recovery room for postoperative care.  AN/NUANCE  D:06/03/2018 T:06/03/2018 JOB:[id number]

## 2018-06-03 NOTE — Progress Notes (Signed)
Ambulated along the hallway with front wheel walker. Tolerated  250 feet  On room air.

## 2018-06-03 NOTE — Progress Notes (Signed)
Fentanyl PCA 3 ml wasted  In the stericycle. Witnessed by another Therapist, sports

## 2018-06-04 ENCOUNTER — Inpatient Hospital Stay (HOSPITAL_COMMUNITY): Payer: Medicare Other

## 2018-06-04 LAB — CBC
HCT: 35.3 % — ABNORMAL LOW (ref 39.0–52.0)
Hemoglobin: 10.8 g/dL — ABNORMAL LOW (ref 13.0–17.0)
MCH: 30.9 pg (ref 26.0–34.0)
MCHC: 30.6 g/dL (ref 30.0–36.0)
MCV: 101.1 fL — ABNORMAL HIGH (ref 80.0–100.0)
Platelets: 154 10*3/uL (ref 150–400)
RBC: 3.49 MIL/uL — ABNORMAL LOW (ref 4.22–5.81)
RDW: 13.7 % (ref 11.5–15.5)
WBC: 11.2 10*3/uL — ABNORMAL HIGH (ref 4.0–10.5)
nRBC: 0 % (ref 0.0–0.2)

## 2018-06-04 LAB — COMPREHENSIVE METABOLIC PANEL
ALK PHOS: 80 U/L (ref 38–126)
ALT: 24 U/L (ref 0–44)
AST: 38 U/L (ref 15–41)
Albumin: 3 g/dL — ABNORMAL LOW (ref 3.5–5.0)
Anion gap: 6 (ref 5–15)
BUN: 16 mg/dL (ref 8–23)
CO2: 28 mmol/L (ref 22–32)
Calcium: 8.3 mg/dL — ABNORMAL LOW (ref 8.9–10.3)
Chloride: 103 mmol/L (ref 98–111)
Creatinine, Ser: 0.88 mg/dL (ref 0.61–1.24)
GFR calc Af Amer: >60 mL/min (ref >60)
GFR calc non Af Amer: >60 mL/min (ref >60)
GLUCOSE: 128 mg/dL — AB (ref 70–99)
Potassium: 4.3 mmol/L (ref 3.5–5.1)
Sodium: 137 mmol/L (ref 135–145)
Total Bilirubin: 0.8 mg/dL (ref 0.3–1.2)
Total Protein: 5.7 g/dL — ABNORMAL LOW (ref 6.5–8.1)

## 2018-06-04 LAB — MAGNESIUM: Magnesium: 1.9 mg/dL (ref 1.7–2.4)

## 2018-06-04 NOTE — Progress Notes (Addendum)
      [address]       [place], [address]             [phone]       2 Days Post-Op Procedure(s) (LRB): VIDEO ASSISTED THORACOSCOPY (VATS)/WEDGE RESECTION with Lymph node disection and intercostal nerve block. (Right)  Subjective: Patient states PCA helping with incisional pain.   Objective: Vital signs in last 24 hours: Temp:  [97.6 F (36.4 C)-98.2 F (36.8 C)] 97.8 F (36.6 C) (03/25 0732) Pulse Rate:  [76-94] 90 (03/24 2300) Cardiac Rhythm: Normal sinus rhythm (03/25 0300) Resp:  [16-28] 20 (03/25 0732) BP: (90-120)/(57-82) 90/57 (03/25 0732) SpO2:  [89 %-97 %] 89 % (03/25 0732) FiO2 (%):  [0 %] 0 % (03/24 1423)     Intake/Output from previous day: 03/24 0701 - 03/25 0700 In: 790 [P.O.:600; I.V.:190] Out: 1080 [Urine:700; Chest Tube:380]   Physical Exam:  Cardiovascular: RRR Pulmonary: Clear to auscultation on left and audible inspiratory sounds on the right (chest tubes in place) Abdomen: Soft, non tender, bowel sounds present. Extremities: Trace bilateral lower extremity edema. Wounds: There is some bloody like drainage around chest tubes but incision is clean and dry Chest Tubes: to water seal,, tidling with cough but no true no air leak  Lab Results: CBC: Recent Labs    06/03/18 0440 06/04/18 0426  WBC 14.4* 11.2*  HGB 10.8* 10.8*  HCT 35.8* 35.3*  PLT 176 154   BMET:  Recent Labs    06/03/18 0440 06/04/18 0426  NA 139 137  K 4.7 4.3  CL 105 103  CO2 25 28  GLUCOSE 151* 128*  BUN 13 16  CREATININE 1.06 0.88  CALCIUM 8.6* 8.3*    PT/INR:  Recent Labs    06/02/18 0616  LABPROT 14.0  INR 1.1   ABG:  INR: Will add last result for INR, ABG once components are confirmed Will add last 4 CBG results once components are confirmed  Assessment/Plan:  1. CV - SR. BP labile (SBP in the 90's) so will not restart Amlodipine yet and will stop Lopressor secondary to SBP in the 90's. Continue Tikosyn. Will restart Xarelto  in a few days 2.  Pulmonary - Chest tube output 380 cc last 24 hours. Chest tubes are to water seal as of this am.There is tidling but no true air leak. CXR this am appears stable. Hope to remove one chest tube. 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 Encourage incentive spirometer 3. Anemia-H and H this am 10.8 and 35.3.Marland Kitchen Continue oral Ferrous bid as has history of anemia 4. Remove central line, foley already removed (will bladder scan to make sure does not have urinary retention)  George M ZimmermanPA-C 06/04/2018,7:37 AM [phone]  D/c anterior chest tube, no air leak  I have seen and examined George Vega and agree with the above assessment  and plan.  [name] MD Beeper [phone] Office [phone] 06/04/2018 9:01 AM

## 2018-06-05 ENCOUNTER — Inpatient Hospital Stay (HOSPITAL_COMMUNITY): Payer: Medicare Other

## 2018-06-05 ENCOUNTER — Other Ambulatory Visit: Payer: Self-pay | Admitting: *Deleted

## 2018-06-05 LAB — MAGNESIUM: Magnesium: 1.9 mg/dL (ref 1.7–2.4)

## 2018-06-05 MED ORDER — METOPROLOL TARTRATE 25 MG PO TABS
25.0000 mg | ORAL_TABLET | Freq: Two times a day (BID) | ORAL | Status: DC
  Administered 2018-06-05 – 2018-06-06 (×3): 25 mg via ORAL
  Filled 2018-06-05 (×3): qty 1

## 2018-06-05 MED ORDER — LACTULOSE 10 GM/15ML PO SOLN
20.0000 g | Freq: Once | ORAL | Status: AC
  Administered 2018-06-05: 20 g via ORAL
  Filled 2018-06-05: qty 30

## 2018-06-05 NOTE — Progress Notes (Signed)
The proposed treatment discussed in cancer conference 06/05/2018 is for discussion purpose only and is not a binding recommendation.  The patient was not physically examined nor present for their treatment options.  Therefore, final treatment plans cannot be decided.

## 2018-06-05 NOTE — Care Management Important Message (Signed)
Important Message  Patient Details  Name: George Vega MRN: [id number] Date of Birth: [birth year]   Medicare Important Message Given:  Yes    Orbie Pyo 06/05/2018, 2:26 PM

## 2018-06-05 NOTE — Progress Notes (Addendum)
      [address]       [place] [address]             [phone]       3 Days Post-Op Procedure(s) (LRB): VIDEO ASSISTED THORACOSCOPY (VATS)/WEDGE RESECTION with Lymph node disection and intercostal nerve block. (Right)  Subjective: Patient has incisional pain but no other complaints.  Objective: Vital signs in last 24 hours: Temp:  [97.7 F (36.5 C)-98.2 F (36.8 C)] 97.7 F (36.5 C) (03/26 0730) Pulse Rate:  [86-90] 90 (03/25 1912) Cardiac Rhythm: Sinus tachycardia (03/26 0700) Resp:  [16-30] 21 (03/26 0338) BP: (98-128)/(66-80) 119/80 (03/26 0730) SpO2:  [90 %-98 %] 95 % (03/26 0338) FiO2 (%):  [4 %] 4 % (03/25 1656)     Intake/Output from previous day: 03/25 0701 - 03/26 0700 In: 1236.7 [P.O.:960; I.V.:256.7] Out: 1680 [Urine:1500; Chest Tube:180]   Physical Exam:  Cardiovascular: Slightly tachycardic Pulmonary: Clear to auscultation on left and slightly diminished basilar breath sounds on the right Abdomen: Soft, non tender, bowel sounds present. Extremities: No lower extremity edema. Wounds: Dressing is clean and dry Chest tube: to water seal, no air leak  Lab Results: CBC: Recent Labs    06/03/18 0440 06/04/18 0426  WBC 14.4* 11.2*  HGB 10.8* 10.8*  HCT 35.8* 35.3*  PLT 176 154   BMET:  Recent Labs    06/03/18 0440 06/04/18 0426  NA 139 137  K 4.7 4.3  CL 105 103  CO2 25 28  GLUCOSE 151* 128*  BUN 13 16  CREATININE 1.06 0.88  CALCIUM 8.6* 8.3*    PT/INR:  No results for input(s): LABPROT, INR in the last 72 hours. ABG:  INR: Will add last result for INR, ABG once components are confirmed Will add last 4 CBG results once components are confirmed  Assessment/Plan:  1. CV - Slightly tachy this am. SBP improved so will restart Lopressor. Continue Tikosyn. Will restart Xarelto at Dr. Everrett Coombe discretion. 2.  Pulmonary - Chest tube output 180 cc last 24 hours. Chest tube is to water seal as of this am and there is  NO air leak. CXR this am appears stable.  Will remove chest tube. On 3 liters of oxygen via . Final pathology: Pathologic Stage Classification (pTNM, AJCC 8th Edition): VISCERAL PLEURA INVASION IS PRESENT,pT2a, pN0-Dr. Servando Snare to discuss. Encourage incentive spirometer 3. Anemia-H and H this am 10.8 and 35.3.Marland Kitchen Continue oral Ferrous bid as has history of anemia 4. Remove central line and stop PCA after chest tube removed 5. Hope to discharge in 1-2 days  D Ezekiel Slocumb 06/05/2018,8:31 AM [phone]  Chest xray , stable - d/c chest tube  Cancer Staging Cancer of right lung [facility]) Staging form: Lung, AJCC 8th Edition - Clinical: No stage assigned - Unsigned - Pathologic stage from 06/05/2018: Stage IB (pT2a, pN0, cM0) - Signed by [name], MD on 06/05/2018  I have seen and examined Aniceto Boss and agree with the above assessment  and plan.  [name] MD Beeper [phone] Office [phone] 06/05/2018 8:31 AM

## 2018-06-05 NOTE — Plan of Care (Signed)
  Problem: Health Behavior/Discharge Planning: Goal: Ability to manage health-related needs will improve Outcome: Progressing   Problem: Clinical Measurements: Goal: Ability to maintain clinical measurements within normal limits will improve Outcome: Progressing Goal: Will remain free from infection Outcome: Progressing Goal: Diagnostic test results will improve Outcome: Progressing Goal: Respiratory complications will improve Outcome: Progressing   Problem: Activity: Goal: Risk for activity intolerance will decrease Outcome: Progressing   Problem: Elimination: Goal: Will not experience complications related to bowel motility Outcome: Progressing Goal: Will not experience complications related to urinary retention Outcome: Progressing   Problem: Pain Managment: Goal: General experience of comfort will improve Outcome: Progressing   Problem: Safety: Goal: Ability to remain free from injury will improve Outcome: Progressing   Problem: Skin Integrity: Goal: Risk for impaired skin integrity will decrease Outcome: Progressing   Problem: Education: Goal: Knowledge of the prescribed therapeutic regimen will improve Outcome: Progressing   Problem: Activity: Goal: Risk for activity intolerance will decrease Outcome: Progressing   Problem: Clinical Measurements: Goal: Postoperative complications will be avoided or minimized Outcome: Progressing   Problem: Respiratory: Goal: Respiratory status will improve Outcome: Progressing   Problem: Pain Management: Goal: Pain level will decrease Outcome: Progressing   Problem: Skin Integrity: Goal: Wound healing without signs and symptoms infection will improve Outcome: Progressing

## 2018-06-06 ENCOUNTER — Inpatient Hospital Stay (HOSPITAL_COMMUNITY): Payer: Medicare Other

## 2018-06-06 ENCOUNTER — Encounter: Payer: Self-pay | Admitting: *Deleted

## 2018-06-06 LAB — MAGNESIUM: Magnesium: 1.9 mg/dL (ref 1.7–2.4)

## 2018-06-06 MED ORDER — FERROUS SULFATE 325 (65 FE) MG PO TABS: 325.0000 mg | ORAL_TABLET | Freq: Two times a day (BID) | ORAL | Status: AC

## 2018-06-06 MED ORDER — OXYCODONE HCL 5 MG PO TABS: 5.0000 mg | ORAL_TABLET | ORAL | 0 refills | Status: DC | PRN

## 2018-06-06 NOTE — Progress Notes (Signed)
Removed PIV access and pt's received discharge instructions. Pt understood well. Provided suture remove kit by charge nurse Archie Patten. Dr. Servando Snare told her to give it because it might cancel the out patient follow up appointment in the future due to Covid virus 19. Pt understood well and explained pt's wife how to use suture remove kit. HS [employer]

## 2018-06-06 NOTE — Discharge Instructions (Signed)
PLEASE REMOVE ALL BLACK CHEST TUBE SUTURES on 06/19/2018  Thoracoscopy, Care After This sheet gives you information about how to care for yourself after your procedure. Your health care provider may also give you more specific instructions. If you have problems or questions, contact your health care provider. What can I expect after the procedure? After the procedure, it is common to have pain and soreness in the surgical area. Follow these instructions at home: Incision care   Follow instructions from your health care provider about how to take care of your incision. Make sure you: ? Wash your hands with soap and water before you change your bandage (dressing). If soap and water are not available, use hand sanitizer. ? Change your dressing as told by your health care provider. ? Leave stitches (sutures), skin glue, or adhesive strips in place. These skin closures may need to stay in place for 2 weeks or longer. If adhesive strip edges start to loosen and curl up, you may trim the loose edges. Do not remove adhesive strips completely unless your health care provider tells you to do that.  Check your incision areas every day for signs of infection. Check for: ? Redness, swelling, or pain. ? Fluid or blood. ? Warmth. ? Pus or a bad smell.  Do not take baths, swim, or use a hot tub until your health care provider approves. You may take showers. Medicines  Take over-the-counter and prescription medicines only as told by your health care provider.  If you were prescribed an antibiotic medicine, take it as told by your health care provider. Do not stop taking the antibiotic even if you start to feel better.  Do not drive or use heavy machinery while taking prescription pain medicine.  If you are taking prescription pain medicine, take actions to prevent or treat constipation. Your health care provider may recommend that you: ? Drink enough fluid to keep your urine pale yellow. ? Eat foods  that are high in fiber, such as fresh fruits and vegetables, whole grains, and beans. ? Limit foods that are high in fat and processed sugars, such as fried and sweet foods. ? Take an over-the-counter or prescription medicine for constipation. Managing pain, stiffness, and swelling   If directed, put ice on the affected area: ? Put ice in a plastic bag. ? Place a towel between your skin and the bag. ? Leave the ice on for 20 minutes, 2-3 times a day. Preventing lung infection  To prevent pneumonia and to keep your lungs healthy: ? Try to cough often. If it hurts to cough, hold a pillow against your chest as you cough. ? Take deep breaths or do breathing exercises as instructed by your health care provider. ? If you were given an incentive spirometer, use it as directed by your health care provider. General instructions  Do not lift anything that is heavier than 10 lb (4.5 kg), or the limit that you are told, until your health care provider says that it is safe.  Do not use any products that contain nicotine or tobacco, such as cigarettes and e-cigarettes. These can delay healing after surgery. If you need help quitting, ask your health care provider.  Avoid driving until your health care provider approves.  If you have a chest drainage tube, care for it as instructed by your health care provider. Do not travel by airplane after the chest drainage tube is removed until your health care provider approves.  Keep all follow-up visits as  told by your health care provider. This is important. Contact a health care provider if:  You have a fever.  Pain medicines do not ease your pain.  You have redness, swelling, or increasing pain in your incision area.  You develop a cough that does not go away, or you are coughing up mucus that is yellow or green. Get help right away if:  You have fluid, blood, or pus coming from your incision.  There is a bad smell coming from your incision or  dressing.  You develop a rash.  You cough up blood.  You develop light-headedness, or you feel faint.  You have difficulty breathing.  You develop chest pain.  Your heartbeat feels irregular or very fast. These symptoms may represent a serious problem that is an emergency. Do not wait to see if the symptoms will go away. Get medical help right away. Call your local emergency services (911 in the [place] Do not drive yourself to the hospital. Summary  Follow instructions from your health care provider about how to take care of your incision.  Do not drive or use heavy machinery while taking prescription pain medicine.  Leave stitches (sutures), skin glue, or adhesive strips in place.  Check your incision areas every day for signs of infection. This information is not intended to replace advice given to you by your health care provider. Make sure you discuss any questions you have with your health care provider. Document Released: 09/15/2004 Document Revised: 02/05/2017 Document Reviewed: 02/05/2017 Elsevier Interactive Patient Education  2019 [employer].

## 2018-06-06 NOTE — Progress Notes (Addendum)
      [address]       [place] [address]             [phone]       4 Days Post-Op Procedure(s) (LRB): VIDEO ASSISTED THORACOSCOPY (VATS)/WEDGE RESECTION with Lymph node disection and intercostal nerve block. (Right)  Subjective: Patient had a bowel movement. He wants to go home.  Objective: Vital signs in last 24 hours: Temp:  [97.8 F (36.6 C)-98.6 F (37 C)] 97.8 F (36.6 C) (03/27 0741) Pulse Rate:  [81-103] 90 (03/27 0741) Cardiac Rhythm: Normal sinus rhythm (03/27 0700) Resp:  [16-24] 22 (03/27 0741) BP: (116-150)/(73-83) 118/78 (03/27 0741) SpO2:  [93 %-99 %] 98 % (03/27 0741)     Intake/Output from previous day: 03/26 0701 - 03/27 0700 In: 1097 [P.O.:1097] Out: 700 [Urine:700]   Physical Exam:  Cardiovascular: RRR Pulmonary: Clear to auscultation on left and slightly diminished basilar breath sounds on the right Abdomen: Soft, non tender, bowel sounds present. Extremities: No lower extremity edema. Wounds: Clean and dry. Very little drainage from chest tube wounds  Lab Results: CBC: Recent Labs    06/04/18 0426  WBC 11.2*  HGB 10.8*  HCT 35.3*  PLT 154   BMET:  Recent Labs    06/04/18 0426  NA 137  K 4.3  CL 103  CO2 28  GLUCOSE 128*  BUN 16  CREATININE 0.88  CALCIUM 8.3*    PT/INR:  No results for input(s): LABPROT, INR in the last 72 hours. ABG:  INR: Will add last result for INR, ABG once components are confirmed Will add last 4 CBG results once components are confirmed  Assessment/Plan:  1. CV -  Continue Lopressor andTikosyn. Will restart Avapro at discharge but not Amlodpine and will also restart Xarelto tonight (as discussed with Dr. Servando Snare). 2.  Pulmonary -  CXR this am appears stable (small right pleural effusion, consolidation posterior right mid lung, and no pneumothorax).  On room air this am. Encourage incentive spirometer 3. Anemia-H and H this am 10.8 and 35.3.Marland Kitchen Continue oral Ferrous bid as  has history of anemia 4. Likely discharge later today  [name] PA-C 06/06/2018,7:45 AM [phone]  Plan home today, chest xray stable  Resume xarelto I have seen and examined Aniceto Boss and agree with the above assessment  and plan.  [name] MD Beeper [phone] Office [phone] 06/06/2018 11:29 AM

## 2018-06-06 NOTE — Progress Notes (Signed)
Oncology Nurse Navigator Documentation  Oncology Nurse Navigator Flowsheets 06/06/2018  Navigator Location [facility]  Referral date to RadOnc/MedOnc -  Navigator Encounter Type Other/I followed up with Dr. Julien Nordmann on when he would like to see George Vega.  He would like to see him in 4 weeks.  I completed a scheduling message for them to schedule him in four weeks for follow up.    Telephone -  Abnormal Finding Date 04/09/2018  Confirmed Diagnosis Date 04/29/2018  Surgery Date 06/02/2018  Multidisiplinary Clinic Date -  Treatment Initiated Date 06/02/2018  Patient Visit Type -  Treatment Phase -  Barriers/Navigation Needs Coordination of Care  Education -  Interventions Coordination of Care  Coordination of Care Other  Education Method -  Acuity Level 2  Time Spent with Patient 30

## 2018-06-09 ENCOUNTER — Ambulatory Visit (INDEPENDENT_AMBULATORY_CARE_PROVIDER_SITE_OTHER): Payer: Self-pay

## 2018-06-09 ENCOUNTER — Other Ambulatory Visit: Payer: Self-pay | Admitting: *Deleted

## 2018-06-09 ENCOUNTER — Encounter: Payer: Self-pay | Admitting: *Deleted

## 2018-06-09 ENCOUNTER — Ambulatory Visit
Admission: RE | Admit: 2018-06-09 | Discharge: 2018-06-09 | Disposition: A | Discharge status: Home / Self Care | Payer: Medicare Other | Source: Ambulatory Visit | Attending: Cardiothoracic Surgery | Admitting: Cardiothoracic Surgery

## 2018-06-09 ENCOUNTER — Other Ambulatory Visit: Payer: Self-pay

## 2018-06-09 DIAGNOSIS — Z48813 Encounter for surgical aftercare following surgery on the respiratory system: Secondary | ICD-10-CM

## 2018-06-09 DIAGNOSIS — C3431 Malignant neoplasm of lower lobe, right bronchus or lung: Secondary | ICD-10-CM

## 2018-06-09 NOTE — Progress Notes (Signed)
George Vega called the office this morning requesting home oxygen.  He has a pulse ox at home and it has registered as low as 85%.  He gets short of breath with and without exertion and needs to sleep with head elevated at night.  I said he needed to come to the office and have a CXR and an oxygen qualifying study.  He agreed.  On arrival he was noted to have some shortness of breath.  He performed the O2 study without difficulty and was noted to have increasing shortness of breath with exertion.  He did meet criteria for home oxygen.  No changes were noted from his discharge CXR.  Dr. Servando Snare was notified.  [facility] will be notified to supply home O2.

## 2018-06-10 ENCOUNTER — Encounter: Payer: Self-pay | Admitting: *Deleted

## 2018-06-12 ENCOUNTER — Telehealth: Payer: Self-pay | Admitting: *Deleted

## 2018-06-12 ENCOUNTER — Encounter: Payer: Self-pay | Admitting: *Deleted

## 2018-06-12 ENCOUNTER — Other Ambulatory Visit: Payer: Self-pay | Admitting: *Deleted

## 2018-06-12 ENCOUNTER — Other Ambulatory Visit: Payer: Self-pay | Admitting: Cardiothoracic Surgery

## 2018-06-12 DIAGNOSIS — C3491 Malignant neoplasm of unspecified part of right bronchus or lung: Secondary | ICD-10-CM

## 2018-06-12 DIAGNOSIS — C3431 Malignant neoplasm of lower lobe, right bronchus or lung: Secondary | ICD-10-CM

## 2018-06-12 NOTE — Telephone Encounter (Signed)
Oncology Nurse Navigator Documentation  Oncology Nurse Navigator Flowsheets 06/12/2018  Navigator Location [facility]  Referral date to RadOnc/MedOnc -  Navigator Encounter Type Telephone/I followed up on George Vega's schedule.  He needs follow up with Dr. Julien Nordmann in 4 weeks per Dr. Julien Nordmann.  I called patient and scheduled him to be seen.  Patient and wife verbalized appt time and place.   Telephone Outgoing Call  Abnormal Finding Date -  Confirmed Diagnosis Date -  Surgery Date -  Multidisiplinary Clinic Date -  Treatment Initiated Date -  Patient Visit Type -  Treatment Phase -  Barriers/Navigation Needs Education;Coordination of Care  Education Other  Interventions Coordination of Care;Education  Coordination of Care Appts  Education Method Verbal  Acuity Level 2  Time Spent with Patient 30

## 2018-06-16 ENCOUNTER — Other Ambulatory Visit: Payer: Self-pay | Admitting: Internal Medicine

## 2018-06-24 ENCOUNTER — Other Ambulatory Visit: Payer: Self-pay | Admitting: Cardiothoracic Surgery

## 2018-06-24 DIAGNOSIS — C3491 Malignant neoplasm of unspecified part of right bronchus or lung: Secondary | ICD-10-CM

## 2018-06-25 ENCOUNTER — Encounter: Payer: Self-pay | Admitting: Cardiothoracic Surgery

## 2018-06-25 NOTE — Progress Notes (Signed)
[address]       [place] [address]             [phone]   Telehealth Visit     Virtual Visit via Telephone Note   This visit type was conducted due to national recommendations for restrictions regarding the COVID-19 Pandemic (e.g. social distancing) in an effort to limit this patient's exposure and mitigate transmission in our community.  Due to his co-morbid illnesses, this patient is at least at moderate risk for complications without adequate follow up.  This format is felt to be most appropriate for this patient at this time.  The patient did not have access to video technology/had technical difficulties with video requiring transitioning to audio format only (telephone).  All issues noted in this document were discussed and addressed.  No physical exam could be performed with this format.    Evaluation Performed:  Follow-up visit [place] Record #[id number] Date of Birth: [birth year]  Referring ID:[id number], Christena Deem, MD Primary Cardiology: Primary Care:Miller, Lattie Haw, MD  Chief Complaint:  Follow Up Visit  Cancer Staging Lung cancer [facility]) Staging form: Lung, AJCC 8th Edition - Clinical: No stage assigned - Unsigned - Pathologic stage from 06/05/2018: Stage IB (pT2a, pN0, cM0) - Signed by [name], MD on 06/05/2018   OPERATIVE REPORT DATE OF PROCEDURE:  06/02/2018 PREOPERATIVE DIAGNOSIS:  Squamous cell carcinoma of the right lower lobe. POSTOPERATIVE DIAGNOSIS:  Squamous cell carcinoma of the right lower lobe. SURGICAL PROCEDURES:  Right video-assisted thoracoscopy,  superior segmentectomy, right lower lobe with lymph node dissection and intercostal nerve block, right 4, 5, 6 and 7 intercostal spaces. SURGEON:  Lanelle Bal, MD  History of Present Illness:           I contacted RIPLEY BOGOSIAN remotely due to the limitations of the current COVID pandemic on 06/26/2018  at  11:13 AM  verifying that I was speaking  to George Vega whose birthday is 15-Dec-1940.   I discussed limitations of the evaluation and management  of patients remotely without  the benefit of physical exam.  The patient was agreeable with proceeding with a remote/ not face to face visit.   Patient notes that he is improving at home.  After he was initially discharged she was noting increasing shortness of breath with activity home oxygen was arranged for him.  He purchased a pulse oximeter and notes on oxygen at 2 L his O2 sats are running 92 to 96%.  Off oxygen ambulating in house runs 86-88.  He notes that he has been using oxygen at night and during the daytime with exertion.  Overall he notes his dyspnea is improving since discharge.  He has some dysesthesias over his right chest but notes the wounds are healing without drainage.     Zubrod Score: At the time of surgery this patient's most appropriate activity status/level should be described as: []     0    Normal activity, no symptoms [x]     1    Restricted in physical strenuous activity but ambulatory, able to do out light work []     2    Ambulatory and capable of self care, unable to do work activities, up and about                 >50 % of waking hours                                                                                   []   3    Only limited self care, in bed greater than 50% of waking hours []     4    Completely disabled, no self care, confined to bed or chair []     5    Moribund  Social History   Tobacco Use  Smoking Status Former Smoker  . Packs/day: 2.00  . Years: 30.00  . Pack years: 60.00  . Types: Cigarettes  . Last attempt to quit: 02/27/1998  . Years since quitting: 20.3  Smokeless Tobacco Never Used       Allergies  Allergen Reactions  . Glucosamine-Chondroitin Anaphylaxis and Other (See Comments)    Stomach cramps, can't eat   . Tetanus Toxoid Anaphylaxis and Hives    REACTION: hives/throat swells shut   . Fish Oil Other (See  Comments)    Stomach cramps, can't eat   . Ibuprofen Nausea Only and Other (See Comments)    Stomach pains   . Naproxen Diarrhea    Current Outpatient Medications  Medication Sig Dispense Refill  . acetaminophen (TYLENOL) 325 MG tablet Take 650 mg by mouth every 6 (six) hours as needed for moderate pain.    . Cholecalciferol (VITAMIN D3) 5000 units TABS Take 5,000 Units by mouth daily.    . Cyanocobalamin (VITAMIN B-12) 500 MCG SUBL Place 500 mcg under the tongue 3 (three) times a week.     . dofetilide (TIKOSYN) 250 MCG capsule TAKE 1 CAPSULE BY MOUTH TWO TIMES DAILY (Patient taking differently: Take 250 mcg by mouth 2 (two) times daily. ) 180 capsule 2  . ferrous sulfate (FEROSUL) 325 (65 FE) MG tablet Take 1 tablet (325 mg total) by mouth 2 (two) times daily with a meal.    . furosemide (LASIX) 40 MG tablet TAKE 1 TABLET(40 MG) BY MOUTH DAILY (Patient taking differently: Take 40 mg by mouth daily. ) 90 tablet 2  . irbesartan (AVAPRO) 150 MG tablet TAKE 1 TABLET(150 MG) BY MOUTH DAILY (Patient taking differently: Take 150 mg by mouth daily. TAKE 1 TABLET(150 MG) BY MOUTH DAILY) 90 tablet 3  . magnesium oxide (MAG-OX) 400 MG tablet Take 400 mg by mouth daily.    . Melatonin 10 MG TABS Take 10 mg by mouth at bedtime as needed (sleep).     . metoprolol tartrate (LOPRESSOR) 25 MG tablet Take 1 tablet (25 mg total) by mouth 2 (two) times daily. Please call and schedule an appt for further refills 180 tablet 0  . Multiple Vitamins-Minerals (PRESERVISION AREDS 2 PO) Take 1 capsule by mouth 2 (two) times daily.    Marland Kitchen oxyCODONE (OXY IR/ROXICODONE) 5 MG immediate release tablet Take 1 tablet (5 mg total) by mouth every 4 (four) hours as needed for severe pain. 28 tablet 0  . pantoprazole (PROTONIX) 40 MG tablet Take 40 mg by mouth daily.     Marland Kitchen PARoxetine (PAXIL) 30 MG tablet Take 30 mg by mouth at bedtime.     . polycarbophil (FIBERCON) 625 MG tablet Take 1,250 mg by mouth at bedtime.     [name] Glycol-Propyl Glycol (SYSTANE OP) Place 2 drops into both eyes 2 (two) times daily as needed (dry eyes).     . potassium chloride (K-DUR) 10 MEQ tablet Take 1 tablet (10 mEq total) by mouth daily. 90 tablet 3  . rosuvastatin (CRESTOR) 5 MG tablet TAKE 1 TABLET BY MOUTH  EVERY MORNING (Patient taking differently: Take 5 mg by mouth daily. ) 90 tablet 3  . tamsulosin (FLOMAX) 0.4  MG CAPS capsule Take 0.4 mg by mouth daily.  11  . XARELTO 15 MG TABS tablet TAKE 1 TABLET BY MOUTH  DAILY WITH SUPPER (Patient taking differently: Take 15 mg by mouth daily with supper. ) 90 tablet 1   No current facility-administered medications for this visit.        Physical Exam: There were no vitals taken for this visit.   Wounds: Patient notes there healing well without drainage or infection  Diagnostic Studies & Laboratory data:         Recent Radiology Findings: Dg Chest 2 View  Result Date: 06/26/2018 CLINICAL DATA:  Squamous cell carcinoma of lung stage I. Postop VATS 06/02/2018 EXAM: CHEST - 2 VIEW COMPARISON:  06/09/2018 FINDINGS: Postop changes in the right chest. Mild improvement in right perihilar airspace density with adjacent surgical clips. Right pleural fluid/reaction on the unchanged. No pneumothorax. Left lung remains clear. Negative for heart failure. Bilateral shoulder replacement IMPRESSION: Postop changes in the right chest with mild interval improvement. No pneumothorax. Electronically Signed   By: Franchot Gallo M.D.   On: 06/26/2018 10:53      I have independently reviewed the above radiology findings and reviewed findings  with the patient.  Recent Labs: Lab Results  Component Value Date   WBC 11.2 (H) 06/04/2018   HGB 10.8 (L) 06/04/2018   HCT 35.3 (L) 06/04/2018   PLT 154 06/04/2018   GLUCOSE 128 (H) 06/04/2018   ALT 24 06/04/2018   AST 38 06/04/2018   NA 137 06/04/2018   K 4.3 06/04/2018   CL 103 06/04/2018   CREATININE 0.88 06/04/2018   BUN 16 06/04/2018    CO2 28 06/04/2018   TSH 1.43 04/15/2017   INR 1.1 06/02/2018      Assessment / Plan:   Patient is stable after resection of his stage Ib non-small cell lung cancer, right lower lobe superior segmentectomy done on March 23. He is avoided any COVID symptoms, and continues with staying at home. We we will plan to have him return to the office in 5 to 6 weeks with a follow-up chest x-ray When he returns to the office we will arrange for postop CT scan 6 months after surgery Patient was instructed to call with any changes in signs or symptoms   Medication Changes: No orders of the defined types were placed in this encounter.     [name] 06/26/2018 11:13 AM

## 2018-06-26 ENCOUNTER — Telehealth (INDEPENDENT_AMBULATORY_CARE_PROVIDER_SITE_OTHER): Payer: Self-pay | Admitting: Cardiothoracic Surgery

## 2018-06-26 ENCOUNTER — Ambulatory Visit
Admission: RE | Admit: 2018-06-26 | Discharge: 2018-06-26 | Disposition: A | Discharge status: Home / Self Care | Payer: Medicare Other | Source: Ambulatory Visit | Attending: Cardiothoracic Surgery | Admitting: Cardiothoracic Surgery

## 2018-06-26 DIAGNOSIS — Z09 Encounter for follow-up examination after completed treatment for conditions other than malignant neoplasm: Secondary | ICD-10-CM

## 2018-06-26 DIAGNOSIS — C3491 Malignant neoplasm of unspecified part of right bronchus or lung: Secondary | ICD-10-CM

## 2018-06-26 NOTE — Progress Notes (Signed)
Post-op f/u with CXR, s/p Right VATS, superior segmentectomy, right lower lobe with lymph node dissection. George Vega requesting telephone call. He states that he is doing well, all incision sites look good. And is not needing any refills on current RX's.

## 2018-07-04 ENCOUNTER — Encounter (INDEPENDENT_AMBULATORY_CARE_PROVIDER_SITE_OTHER): Payer: Medicare Other | Admitting: Ophthalmology

## 2018-07-07 ENCOUNTER — Other Ambulatory Visit: Payer: Self-pay

## 2018-07-07 ENCOUNTER — Inpatient Hospital Stay: Payer: Medicare Other

## 2018-07-07 ENCOUNTER — Inpatient Hospital Stay: Payer: Medicare Other | Attending: Family Medicine | Admitting: Internal Medicine

## 2018-07-07 ENCOUNTER — Encounter: Payer: Self-pay | Admitting: Internal Medicine

## 2018-07-07 VITALS — BP 94/62 | HR 74 | Temp 99.3°F | Resp 18 | Ht 69.0 in | Wt 209.0 lb

## 2018-07-07 DIAGNOSIS — I10 Essential (primary) hypertension: Secondary | ICD-10-CM | POA: Diagnosis not present

## 2018-07-07 DIAGNOSIS — C3491 Malignant neoplasm of unspecified part of right bronchus or lung: Secondary | ICD-10-CM

## 2018-07-07 DIAGNOSIS — Z87891 Personal history of nicotine dependence: Secondary | ICD-10-CM | POA: Diagnosis not present

## 2018-07-07 DIAGNOSIS — C3490 Malignant neoplasm of unspecified part of unspecified bronchus or lung: Secondary | ICD-10-CM

## 2018-07-07 DIAGNOSIS — C3431 Malignant neoplasm of lower lobe, right bronchus or lung: Secondary | ICD-10-CM

## 2018-07-07 DIAGNOSIS — Z79899 Other long term (current) drug therapy: Secondary | ICD-10-CM | POA: Diagnosis not present

## 2018-07-07 DIAGNOSIS — I2510 Atherosclerotic heart disease of native coronary artery without angina pectoris: Secondary | ICD-10-CM | POA: Insufficient documentation

## 2018-07-07 DIAGNOSIS — F329 Major depressive disorder, single episode, unspecified: Secondary | ICD-10-CM | POA: Insufficient documentation

## 2018-07-07 DIAGNOSIS — I4819 Other persistent atrial fibrillation: Secondary | ICD-10-CM | POA: Insufficient documentation

## 2018-07-07 DIAGNOSIS — M1990 Unspecified osteoarthritis, unspecified site: Secondary | ICD-10-CM | POA: Diagnosis not present

## 2018-07-07 DIAGNOSIS — Z7901 Long term (current) use of anticoagulants: Secondary | ICD-10-CM | POA: Diagnosis not present

## 2018-07-07 DIAGNOSIS — K589 Irritable bowel syndrome without diarrhea: Secondary | ICD-10-CM | POA: Insufficient documentation

## 2018-07-07 LAB — CMP (CANCER CENTER ONLY)
ALT: 26 U/L (ref 0–44)
AST: 27 U/L (ref 15–41)
Albumin: 3.4 g/dL — ABNORMAL LOW (ref 3.5–5.0)
Alkaline Phosphatase: 160 U/L — ABNORMAL HIGH (ref 38–126)
Anion gap: 10 (ref 5–15)
BUN: 14 mg/dL (ref 8–23)
CO2: 28 mmol/L (ref 22–32)
Calcium: 8.7 mg/dL — ABNORMAL LOW (ref 8.9–10.3)
Chloride: 101 mmol/L (ref 98–111)
Creatinine: 0.85 mg/dL (ref 0.61–1.24)
GFR, Est AFR Am: >60 mL/min (ref >60)
GFR, Estimated: >60 mL/min (ref >60)
Glucose, Bld: 119 mg/dL — ABNORMAL HIGH (ref 70–99)
Potassium: 3.8 mmol/L (ref 3.5–5.1)
Sodium: 139 mmol/L (ref 135–145)
Total Bilirubin: 0.6 mg/dL (ref 0.3–1.2)
Total Protein: 6.6 g/dL (ref 6.5–8.1)

## 2018-07-07 LAB — CBC WITH DIFFERENTIAL (CANCER CENTER ONLY)
Abs Immature Granulocytes: 0.02 10*3/uL (ref 0.00–0.07)
Basophils Absolute: 0.1 10*3/uL (ref 0.0–0.1)
Basophils Relative: 1 %
Eosinophils Absolute: 0.8 10*3/uL — ABNORMAL HIGH (ref 0.0–0.5)
Eosinophils Relative: 8 %
HCT: 41.7 % (ref 39.0–52.0)
Hemoglobin: 13.2 g/dL (ref 13.0–17.0)
Immature Granulocytes: 0 %
Lymphocytes Relative: 20 %
Lymphs Abs: 2 10*3/uL (ref 0.7–4.0)
MCH: 30.8 pg (ref 26.0–34.0)
MCHC: 31.7 g/dL (ref 30.0–36.0)
MCV: 97.2 fL (ref 80.0–100.0)
Monocytes Absolute: 0.9 10*3/uL (ref 0.1–1.0)
Monocytes Relative: 9 %
Neutro Abs: 6.2 10*3/uL (ref 1.7–7.7)
Neutrophils Relative %: 62 %
Platelet Count: 207 10*3/uL (ref 150–400)
RBC: 4.29 MIL/uL (ref 4.22–5.81)
RDW: 13 % (ref 11.5–15.5)
WBC Count: 10 10*3/uL (ref 4.0–10.5)
nRBC: 0 % (ref 0.0–0.2)

## 2018-07-07 NOTE — Progress Notes (Signed)
[facility] Telephone:([phone]) [phone]   [phone]  OFFICE PROGRESS NOTE  [name], MD [place] [address]  DIAGNOSIS: Stage IB (T2a, N0, M0) non-small cell lung cancer, squamous cell carcinoma presented with right lower lobe lung nodule diagnosed in February 2020.  PRIOR THERAPY: Right VATS with superior segmentectomy on the right lower lobe with lymph node dissection under the care of Dr. Servando Snare on June 02, 2018.  CURRENT THERAPY: Observation.  INTERVAL HISTORY: George Vega 78 y.o. male returns to the clinic today for follow-up visit.  The patient recently underwent surgical resection of his tumor under the care of Dr. Servando Snare.  He continues to have soreness on the right side of the chest.  He denied having any current shortness of breath, cough or hemoptysis.  He denied having any fever or chills.  He has no nausea, vomiting, diarrhea or constipation.  He has no headache or visual changes.  The tumor size was 2.7 cm but there was visceropleural involvement.  He is here today for evaluation and recommendation regarding treatment of his condition.  MEDICAL HISTORY: Past Medical History:  Diagnosis Date  . Arthritis   . Atrial fibrillation, persistent   . Chronic sinusitis   . Coronary artery disease   . Depression   . Difficult intubation    was told with shoulder done 2006-alittle narrow  . Dysrhythmia    A. Fib  . 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 Hypertension   . Jejunostomy tube fell out    when asked about this in 04/2017, pt denied ever having a J tube, feeding tube, tubes placed post surgery so ??? veracity of a previous J tube.    . 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 Spastic colon   . Squamous cell carcinoma of lung, stage I, right ([facility]) 05/07/2018   bx 04/29/18; isolated PET uptake in RLL mass  . Stroke (cerebrum) ([facility])   . SVT (supraventricular tachycardia) ([facility])     ALLERGIES:  is  allergic to glucosamine-chondroitin; tetanus toxoid; fish oil; ibuprofen; and naproxen.  MEDICATIONS:  Current Outpatient Medications  Medication Sig Dispense Refill  . acetaminophen (TYLENOL) 325 MG tablet Take 650 mg by mouth every 6 (six) hours as needed for moderate pain.    . Cholecalciferol (VITAMIN D3) 5000 units TABS Take 5,000 Units by mouth daily.    . Cyanocobalamin (VITAMIN B-12) 500 MCG SUBL Place 500 mcg under the tongue 3 (three) times a week.     . dofetilide (TIKOSYN) 250 MCG capsule TAKE 1 CAPSULE BY MOUTH TWO TIMES DAILY (Patient taking differently: Take 250 mcg by mouth 2 (two) times daily. ) 180 capsule 2  . ferrous sulfate (FEROSUL) 325 (65 FE) MG tablet Take 1 tablet (325 mg total) by mouth 2 (two) times daily with a meal.    . furosemide (LASIX) 40 MG tablet TAKE 1 TABLET(40 MG) BY MOUTH DAILY (Patient taking differently: Take 40 mg by mouth daily. ) 90 tablet 2  . irbesartan (AVAPRO) 150 MG tablet TAKE 1 TABLET(150 MG) BY MOUTH DAILY (Patient taking differently: Take 150 mg by mouth daily. TAKE 1 TABLET(150 MG) BY MOUTH DAILY) 90 tablet 3  . magnesium oxide (MAG-OX) 400 MG tablet Take 400 mg by mouth daily.    . Melatonin 10 MG TABS Take 10 mg by mouth at bedtime as needed (sleep).     . metoprolol tartrate (LOPRESSOR) 25 MG tablet Take  1 tablet (25 mg total) by mouth 2 (two) times daily. Please call and schedule an appt for further refills 180 tablet 0  . Multiple Vitamins-Minerals (PRESERVISION AREDS 2 PO) Take 1 capsule by mouth 2 (two) times daily.    Marland Kitchen oxyCODONE (OXY IR/ROXICODONE) 5 MG immediate release tablet Take 1 tablet (5 mg total) by mouth every 4 (four) hours as needed for severe pain. 28 tablet 0  . pantoprazole (PROTONIX) 40 MG tablet Take 40 mg by mouth daily.     Marland Kitchen PARoxetine (PAXIL) 30 MG tablet Take 30 mg by mouth at bedtime.     . polycarbophil (FIBERCON) 625 MG tablet Take 1,250 mg by mouth at bedtime.     [name] Glycol-Propyl Glycol (SYSTANE OP)  Place 2 drops into both eyes 2 (two) times daily as needed (dry eyes).     . potassium chloride (K-DUR) 10 MEQ tablet Take 1 tablet (10 mEq total) by mouth daily. 90 tablet 3  . rosuvastatin (CRESTOR) 5 MG tablet TAKE 1 TABLET BY MOUTH  EVERY MORNING (Patient taking differently: Take 5 mg by mouth daily. ) 90 tablet 3  . tamsulosin (FLOMAX) 0.4 MG CAPS capsule Take 0.4 mg by mouth daily.  11  . XARELTO 15 MG TABS tablet TAKE 1 TABLET BY MOUTH  DAILY WITH SUPPER (Patient taking differently: Take 15 mg by mouth daily with supper. ) 90 tablet 1   No current facility-administered medications for this visit.     SURGICAL HISTORY:  Past Surgical History:  Procedure Laterality Date  . CARDIAC CATHETERIZATION    . CATARACT EXTRACTION, BILATERAL    . COLONOSCOPY WITH PROPOFOL N/A 04/17/2017   Procedure: COLONOSCOPY WITH PROPOFOL;  Surgeon: Yetta Flock, MD;  Location: [facility];  Service: Gastroenterology;  Laterality: N/A;  . ESOPHAGEAL DILATION  2017  . ESOPHAGOGASTRODUODENOSCOPY (EGD) WITH PROPOFOL N/A 04/17/2017   Procedure: ESOPHAGOGASTRODUODENOSCOPY (EGD) WITH PROPOFOL;  Surgeon: Yetta Flock, MD;  Location: [facility];  Service: Gastroenterology;  Laterality: N/A;  . EYE SURGERY    . FOOT NEUROMA SURGERY  2002  . LEFT HEART CATHETERIZATION WITH CORONARY ANGIOGRAM Right 06/14/2011   20% LM, chronic occluded mid LAD, 50% ostial LCX, 20% mid RI, RCA with collaterals to mid LAD, mid 10% stenosis, EF 60% 06/14/11  . NASAL SINUS SURGERY    . SHOULDER ARTHROSCOPY  03/01/2011   Procedure: ARTHROSCOPY SHOULDER;  Surgeon: Ninetta Lights, MD;  Location: [facility];  Service: Orthopedics;  Laterality: Right;  arthroscopy shoulder decompression subacromial partial acromioplasty with coracoacromial release, distal claviculectomy, debridement of labrium  . SHOULDER ARTHROSCOPY  03/01/2011   Procedure: ARTHROSCOPY SHOULDER;  Surgeon: Ninetta Lights, MD;  Location: [facility];  Service: Orthopedics;  Laterality: Right;  arthroscopy shoulder decompression subacromial partial acromioplasty with coracoacromial release, distal claviculectomy, debridement of labrium  . TEE WITHOUT CARDIOVERSION N/A 03/19/2016   Procedure: TRANSESOPHAGEAL ECHOCARDIOGRAM (TEE);  Surgeon: Larey Dresser, MD;  Location: [facility];  Service: Cardiovascular;  Laterality: N/A;  . TOTAL KNEE ARTHROPLASTY Right 09/23/2013   Procedure: TOTAL KNEE ARTHROPLASTY;  Surgeon: Ninetta Lights, MD;  Location: [facility];  Service: Orthopedics;  Laterality: Right;  . TOTAL SHOULDER ARTHROPLASTY  06/28/2011   Procedure: TOTAL SHOULDER ARTHROPLASTY;  Surgeon: Ninetta Lights, MD;  Location: [facility];  Service: Orthopedics;  Laterality: Right;  . UVULOPALATOPHARYNGOPLASTY    . VIDEO ASSISTED THORACOSCOPY (VATS)/WEDGE RESECTION Right 06/02/2018   Procedure: VIDEO ASSISTED THORACOSCOPY (VATS)/WEDGE RESECTION  with Lymph node disection and intercostal nerve block.;  Surgeon: [name], MD;  Location: [facility];  Service: Thoracic;  Laterality: Right;    REVIEW OF SYSTEMS:  Constitutional: negative Eyes: negative Ears, nose, mouth, throat, and face: negative Respiratory: positive for pleurisy/chest pain Cardiovascular: negative Gastrointestinal: negative Genitourinary:negative Integument/breast: negative Hematologic/lymphatic: negative Musculoskeletal:negative Neurological: negative Behavioral/Psych: negative Endocrine: negative Allergic/Immunologic: negative   PHYSICAL EXAMINATION: General appearance: alert, cooperative, fatigued and no distress Head: Normocephalic, without obvious abnormality, atraumatic Neck: no adenopathy, no JVD, supple, symmetrical, trachea midline and thyroid not enlarged, symmetric, no tenderness/mass/nodules Lymph nodes: Cervical, supraclavicular, and axillary nodes normal. Resp: clear to auscultation bilaterally Back: symmetric, no curvature. ROM  normal. No CVA tenderness. Cardio: regular rate and rhythm, S1, S2 normal, no murmur, click, rub or gallop GI: soft, non-tender; bowel sounds normal; no masses,  no organomegaly Extremities: extremities normal, atraumatic, no cyanosis or edema Neurologic: Alert and oriented X 3, normal strength and tone. Normal symmetric reflexes. Normal coordination and gait  ECOG PERFORMANCE STATUS: 1 - Symptomatic but completely ambulatory  Blood pressure 94/62, pulse 74, temperature 99.3 F (37.4 C), temperature source Oral, resp. rate 18, height 5\' 9"  (1.753 m), weight 209 lb (94.8 kg), SpO2 97 %.  LABORATORY DATA: Lab Results  Component Value Date   WBC 10.0 07/07/2018   HGB 13.2 07/07/2018   HCT 41.7 07/07/2018   MCV 97.2 07/07/2018   PLT 207 07/07/2018      Chemistry      Component Value Date/Time   NA 137 06/04/2018 0426   NA 140 05/08/2017 0828   K 4.3 06/04/2018 0426   CL 103 06/04/2018 0426   CO2 28 06/04/2018 0426   BUN 16 06/04/2018 0426   BUN 17 05/08/2017 0828   CREATININE 0.88 06/04/2018 0426   CREATININE 0.81 05/15/2018 1323      Component Value Date/Time   CALCIUM 8.3 (L) 06/04/2018 0426   ALKPHOS 80 06/04/2018 0426   AST 38 06/04/2018 0426   AST 27 05/15/2018 1323   ALT 24 06/04/2018 0426   ALT 28 05/15/2018 1323   BILITOT 0.8 06/04/2018 0426   BILITOT 0.7 05/15/2018 1323       RADIOGRAPHIC STUDIES: Dg Chest 2 View  Result Date: 06/26/2018 CLINICAL DATA:  Squamous cell carcinoma of lung stage I. Postop VATS 06/02/2018 EXAM: CHEST - 2 VIEW COMPARISON:  06/09/2018 FINDINGS: Postop changes in the right chest. Mild improvement in right perihilar airspace density with adjacent surgical clips. Right pleural fluid/reaction on the unchanged. No pneumothorax. Left lung remains clear. Negative for heart failure. Bilateral shoulder replacement IMPRESSION: Postop changes in the right chest with mild interval improvement. No pneumothorax. Electronically Signed   By: Franchot Gallo M.D.   On: 06/26/2018 10:53   Dg Chest 2 View  Result Date: 06/09/2018 CLINICAL DATA:  78 year old male with a history of cough and shortness of breath. VATS 06/02/2018 with wedge resection EXAM: CHEST - 2 VIEW COMPARISON:  06/06/2018, 06/05/2018, 06/04/2018 FINDINGS: Cardiomediastinal silhouette unchanged in size and contour. Similar appearance of reticulonodular opacities of the right hilar region and mid lung as well as the right lung base with asymmetric elevation the right hemidiaphragm. Pleuroparenchymal opacity on the right lung periphery, unchanged. Surgical changes of the bilateral shoulders. IMPRESSION: Similar appearance of the chest x-ray, with no pneumothorax or new pleural effusion. Similar appearance of the right lung, likely with postsurgical change/atelectasis in the mid lung and lung base. Electronically Signed   By: Corrie Mckusick D.O.  On: 06/09/2018 11:37    ASSESSMENT AND PLAN: This is a very pleasant 78 years old white male with a stage Ib non-small cell lung cancer, squamous cell carcinoma status post superior segmentectomy of the right lower lobe with lymph node dissection in March 2020 under the care of Dr. Servando Snare. The tumor size was 2.7 cm with visceropleural involvement. I had a lengthy discussion with the patient today about his current condition and treatment options. I explained to the patient that there is no survival benefit for adjuvant systemic chemotherapy for patient with stage Ib if the tumor size is less than 4.0 cm. I recommended for the patient to continue on observation with repeat CT scan of the chest in 6 months. He was advised to call immediately if he has any concerning symptoms in the interval. The patient voices understanding of current disease status and treatment options and is in agreement with the current care plan.  All questions were answered. The patient knows to call the clinic with any problems, questions or concerns. We can certainly  see the patient much sooner if necessary.  I spent 15 minutes counseling the patient face to face. The total time spent in the appointment was 25 minutes.  Disclaimer: This note was dictated with voice recognition software. Similar sounding words can inadvertently be transcribed and may not be corrected upon review.

## 2018-07-08 ENCOUNTER — Telehealth: Payer: Self-pay | Admitting: Internal Medicine

## 2018-07-08 NOTE — Telephone Encounter (Signed)
Called regarding schedule °

## 2018-07-17 ENCOUNTER — Encounter (INDEPENDENT_AMBULATORY_CARE_PROVIDER_SITE_OTHER): Payer: Medicare Other | Admitting: Ophthalmology

## 2018-07-17 ENCOUNTER — Other Ambulatory Visit: Payer: Self-pay

## 2018-07-17 DIAGNOSIS — H43813 Vitreous degeneration, bilateral: Secondary | ICD-10-CM

## 2018-07-17 DIAGNOSIS — H35033 Hypertensive retinopathy, bilateral: Secondary | ICD-10-CM

## 2018-07-17 DIAGNOSIS — H353122 Nonexudative age-related macular degeneration, left eye, intermediate dry stage: Secondary | ICD-10-CM | POA: Diagnosis not present

## 2018-07-17 DIAGNOSIS — H353211 Exudative age-related macular degeneration, right eye, with active choroidal neovascularization: Secondary | ICD-10-CM | POA: Diagnosis not present

## 2018-07-17 DIAGNOSIS — I10 Essential (primary) hypertension: Secondary | ICD-10-CM

## 2018-07-17 DIAGNOSIS — D3131 Benign neoplasm of right choroid: Secondary | ICD-10-CM

## 2018-07-23 ENCOUNTER — Other Ambulatory Visit: Payer: Self-pay

## 2018-07-23 ENCOUNTER — Other Ambulatory Visit: Payer: Self-pay | Admitting: Cardiothoracic Surgery

## 2018-07-23 DIAGNOSIS — C3491 Malignant neoplasm of unspecified part of right bronchus or lung: Secondary | ICD-10-CM

## 2018-07-24 ENCOUNTER — Ambulatory Visit
Admission: RE | Admit: 2018-07-24 | Discharge: 2018-07-24 | Disposition: A | Discharge status: Home / Self Care | Payer: Medicare Other | Source: Ambulatory Visit | Attending: Cardiothoracic Surgery | Admitting: Cardiothoracic Surgery

## 2018-07-24 ENCOUNTER — Encounter: Payer: Self-pay | Admitting: Cardiothoracic Surgery

## 2018-07-24 ENCOUNTER — Encounter: Payer: Self-pay | Admitting: *Deleted

## 2018-07-24 ENCOUNTER — Ambulatory Visit (INDEPENDENT_AMBULATORY_CARE_PROVIDER_SITE_OTHER): Payer: Self-pay | Admitting: Cardiothoracic Surgery

## 2018-07-24 VITALS — BP 87/60 | HR 77 | Temp 98.9°F | Resp 16 | Ht 69.0 in | Wt 210.0 lb

## 2018-07-24 DIAGNOSIS — Z09 Encounter for follow-up examination after completed treatment for conditions other than malignant neoplasm: Secondary | ICD-10-CM

## 2018-07-24 DIAGNOSIS — C3491 Malignant neoplasm of unspecified part of right bronchus or lung: Secondary | ICD-10-CM

## 2018-07-24 DIAGNOSIS — C3431 Malignant neoplasm of lower lobe, right bronchus or lung: Secondary | ICD-10-CM

## 2018-07-24 NOTE — Progress Notes (Signed)
[address]       [place] [address]             [phone]   Telehealth Visit     Evaluation Performed:  Follow-up visit [place] Record #[id number] Date of Birth: 04-07-1940  Referring YI:RSWN, Christena Deem, MD Primary Cardiology: Primary Care:Miller, Lattie Haw, MD  Chief Complaint:  Follow Up Visit  Cancer Staging Lung cancer [facility]) Staging form: Lung, AJCC 8th Edition - Clinical: No stage assigned - Unsigned - Pathologic stage from 06/05/2018: Stage IB (pT2a, pN0, cM0) - Signed by [name], MD on 06/05/2018   OPERATIVE REPORT DATE OF PROCEDURE:  06/02/2018 PREOPERATIVE DIAGNOSIS:  Squamous cell carcinoma of the right lower lobe. POSTOPERATIVE DIAGNOSIS:  Squamous cell carcinoma of the right lower lobe. SURGICAL PROCEDURES:  Right video-assisted thoracoscopy,  superior segmentectomy, right lower lobe with lymph node dissection and intercostal nerve block, right 4, 5, 6 and 7 intercostal spaces. 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  The patient did require home oxygen at the time of discharge he still uses it occasionally but mostly at night.  He has remained active around his house denies significant shortness of breath he has had no fever or chills.   Zubrod Score: At the time of surgery this patient's most appropriate activity status/level should be described as: []     0    Normal activity, no symptoms [x]     1    Restricted in physical strenuous activity but ambulatory, able to do out light work []     2    Ambulatory and capable of self care, unable to do work activities, up and about                 >50 % of waking hours                                                                                    []     3    Only limited self care, in bed greater than 50% of waking hours []     4    Completely disabled, no self care, confined to bed or chair []     5    Moribund  Social History   Tobacco Use  Smoking Status Former Smoker  . Packs/day: 2.00  . Years: 30.00  . Pack years: 60.00  . Types: Cigarettes  . Last attempt to quit: 02/27/1998  . Years since quitting: 20.4  Smokeless Tobacco Never Used       Allergies  Allergen Reactions  . Glucosamine-Chondroitin Anaphylaxis and Other (See Comments)    Stomach cramps, can't eat   . Tetanus Toxoid Anaphylaxis and Hives    REACTION: hives/throat swells shut   . Fish Oil Other (See Comments)    Stomach cramps, can't eat   . Ibuprofen Nausea Only and Other (See Comments)    Stomach pains   .  Naproxen Diarrhea    Current Outpatient Medications  Medication Sig Dispense Refill  . acetaminophen (TYLENOL) 325 MG tablet Take 650 mg by mouth every 6 (six) hours as needed for moderate pain.    . Cholecalciferol (VITAMIN D3) 5000 units TABS Take 5,000 Units by mouth daily.    . Cyanocobalamin (VITAMIN B-12) 500 MCG SUBL Place 500 mcg under the tongue 3 (three) times a week.     . dofetilide (TIKOSYN) 250 MCG capsule TAKE 1 CAPSULE BY MOUTH TWO TIMES DAILY (Patient taking differently: Take 250 mcg by mouth 2 (two) times daily. ) 180 capsule 2  . ferrous sulfate (FEROSUL) 325 (65 FE) MG tablet Take 1 tablet (325 mg total) by mouth 2 (two) times daily with a meal.    . furosemide (LASIX) 40 MG tablet TAKE 1 TABLET(40 MG) BY MOUTH DAILY (Patient taking differently: Take 40 mg by mouth daily. ) 90 tablet 2  . irbesartan (AVAPRO) 150 MG tablet TAKE 1 TABLET(150 MG) BY MOUTH DAILY (Patient taking differently: Take 150 mg by mouth daily. TAKE 1 TABLET(150 MG) BY MOUTH DAILY) 90 tablet 3  . magnesium oxide (MAG-OX) 400 MG tablet Take 400 mg by mouth daily.    . Melatonin 10 MG TABS Take 10 mg by mouth at bedtime as needed (sleep).     .  metoprolol tartrate (LOPRESSOR) 25 MG tablet Take 1 tablet (25 mg total) by mouth 2 (two) times daily. Please call and schedule an appt for further refills 180 tablet 0  . Multiple Vitamins-Minerals (PRESERVISION AREDS 2 PO) Take 1 capsule by mouth 2 (two) times daily.    Marland Kitchen oxyCODONE (OXY IR/ROXICODONE) 5 MG immediate release tablet Take 1 tablet (5 mg total) by mouth every 4 (four) hours as needed for severe pain. 28 tablet 0  . pantoprazole (PROTONIX) 40 MG tablet Take 40 mg by mouth daily.     Marland Kitchen PARoxetine (PAXIL) 30 MG tablet Take 30 mg by mouth at bedtime.     . polycarbophil (FIBERCON) 625 MG tablet Take 1,250 mg by mouth at bedtime.     [name] Glycol-Propyl Glycol (SYSTANE OP) Place 2 drops into both eyes 2 (two) times daily as needed (dry eyes).     . potassium chloride (K-DUR) 10 MEQ tablet Take 1 tablet (10 mEq total) by mouth daily. 90 tablet 3  . rosuvastatin (CRESTOR) 5 MG tablet TAKE 1 TABLET BY MOUTH  EVERY MORNING (Patient taking differently: Take 5 mg by mouth daily. ) 90 tablet 3  . tamsulosin (FLOMAX) 0.4 MG CAPS capsule Take 0.4 mg by mouth daily.  11  . XARELTO 15 MG TABS tablet TAKE 1 TABLET BY MOUTH  DAILY WITH SUPPER (Patient taking differently: Take 15 mg by mouth daily with supper. ) 90 tablet 1   No current facility-administered medications for this visit.        Physical Exam: BP (!) 87/60 (BP Location: Right Arm, Patient Position: Sitting, Cuff Size: Normal)   Pulse 77   Temp 98.9 F (37.2 C) (Oral)   Resp 16   Ht 5\' 9"  (1.753 m)   Wt 210 lb (95.3 kg)   SpO2 92% Comment: RA  BMI 31.01 kg/m  General appearance: alert, cooperative and no distress Head: Normocephalic, without obvious abnormality, atraumatic Neck: no adenopathy, no carotid bruit, no JVD, supple, symmetrical, trachea midline and thyroid not enlarged, symmetric, no tenderness/mass/nodules Resp: clear to auscultation bilaterally Cardio: regular rate and rhythm, S1, S2 normal, no murmur,  click, rub  or gallop GI: soft, non-tender; bowel sounds normal; no masses,  no organomegaly Extremities: extremities normal, atraumatic, no cyanosis or edema and Homans sign is negative, no sign of DVT Neurologic: Grossly normal Right chest incisions are healing well  Wounds: Patient notes there healing well without drainage or infection  Diagnostic Studies & Laboratory data:         Recent Radiology Findings: Dg Chest 2 View  Result Date: 07/24/2018 CLINICAL DATA:  Lung cancer EXAM: CHEST - 2 VIEW COMPARISON:  06/26/2018 FINDINGS: There is unchanged volume loss in the right hemithorax related to prior VATS. The left lung field is essentially clear. There is no evidence of a pneumothorax. The cardiac silhouette is unchanged in size. There is no acute osseous abnormality. The patient is status post prior total shoulder arthroplasty. IMPRESSION: Stable appearance of the chest with no evidence of an acute cardiopulmonary process. Electronically Signed   By: Constance Holster M.D.   On: 07/24/2018 16:10      I have independently reviewed the above radiology findings and reviewed findings  with the patient.  Recent Labs: Lab Results  Component Value Date   WBC 10.0 07/07/2018   HGB 13.2 07/07/2018   HCT 41.7 07/07/2018   PLT 207 07/07/2018   GLUCOSE 119 (H) 07/07/2018   ALT 26 07/07/2018   AST 27 07/07/2018   NA 139 07/07/2018   K 3.8 07/07/2018   CL 101 07/07/2018   CREATININE 0.85 07/07/2018   BUN 14 07/07/2018   CO2 28 07/07/2018   TSH 1.43 04/15/2017   INR 1.1 06/02/2018      Assessment / Plan:   Patient is stable after resection of his stage Ib non-small cell lung cancer, right lower lobe superior segmentectomy done on March 23.,  His need for supplemental oxygen has improved.  Plan to see him back in 3 months, he has a follow-up CT scan scheduled for late October by Dr. Earlie Server.    Medication Changes: No orders of the defined types were placed in this encounter.      [name] 07/24/2018 4:29 PM

## 2018-08-17 ENCOUNTER — Other Ambulatory Visit: Payer: Self-pay | Admitting: Internal Medicine

## 2018-08-17 ENCOUNTER — Other Ambulatory Visit: Payer: Self-pay | Admitting: Cardiology

## 2018-08-18 NOTE — Telephone Encounter (Signed)
Xarelto 15mg  refill request received; pt is 78 yrs old, wt-95.3kg, Crea-0.85 on 07/07/2018, last seen by Dr. Lovena Le on 04/08/2018, CrCl-98.9ml/min; per 04/18/17 discharge summary by Dr. Beryle Beams the pt is to stay on Xarelto 15mg  asDr. Granfortunawasconsulted andrecommended continuing Xarelto at 15 mg dailywith history of GI bleeding and per OV notes from [place] pt tocontinue watchful waiting on Xarelto.Will send in refill to requested pharmacy.

## 2018-08-27 ENCOUNTER — Other Ambulatory Visit: Payer: Self-pay | Admitting: Physician Assistant

## 2018-09-02 ENCOUNTER — Ambulatory Visit: Payer: Medicare Other | Admitting: Internal Medicine

## 2018-09-09 ENCOUNTER — Ambulatory Visit (INDEPENDENT_AMBULATORY_CARE_PROVIDER_SITE_OTHER): Payer: Medicare Other | Admitting: Internal Medicine

## 2018-09-09 ENCOUNTER — Encounter: Payer: Self-pay | Admitting: Internal Medicine

## 2018-09-09 ENCOUNTER — Other Ambulatory Visit: Payer: Self-pay

## 2018-09-09 VITALS — BP 102/66 | HR 70 | Ht 69.0 in | Wt 206.0 lb

## 2018-09-09 DIAGNOSIS — I4819 Other persistent atrial fibrillation: Secondary | ICD-10-CM

## 2018-09-09 DIAGNOSIS — I2510 Atherosclerotic heart disease of native coronary artery without angina pectoris: Secondary | ICD-10-CM

## 2018-09-09 MED ORDER — METOPROLOL TARTRATE 25 MG PO TABS: 25.0000 mg | ORAL_TABLET | Freq: Every day | ORAL | 3 refills | Status: DC | PRN

## 2018-09-09 NOTE — Progress Notes (Signed)
HPI George Vega returns today for followup of palpitations, and PAF. He has been diagnosed with squamous cell CA of the lung. He has been staged and is stage 1. He has continued to take his dofetilide. He has not missed his Xarelto. He had seen my partner Dr. Curt Bears in the past regarding an atrial fib ablation but did not feel like he was bad enough to recommend. His current spells occur once a month or two and last for an hour. His CARDIA device demonstrates atrial fib with VR of 120-170/min. He has not had syncope. Allergies  Allergen Reactions   Glucosamine-Chondroitin Anaphylaxis and Other (See Comments)    Stomach cramps, can't eat    Tetanus Toxoid Anaphylaxis and Hives    REACTION: hives/throat swells shut    Fish Oil Other (See Comments)    Stomach cramps, can't eat    Ibuprofen Nausea Only and Other (See Comments)    Stomach pains    Naproxen Diarrhea     Current Outpatient Medications  Medication Sig Dispense Refill   acetaminophen (TYLENOL) 325 MG tablet Take 650 mg by mouth every 6 (six) hours as needed for moderate pain.     Cholecalciferol (VITAMIN D3) 5000 units TABS Take 5,000 Units by mouth daily.     Cyanocobalamin (VITAMIN B-12) 500 MCG SUBL Place 500 mcg under the tongue 3 (three) times a week.      dofetilide (TIKOSYN) 250 MCG capsule TAKE 1 CAPSULE BY MOUTH TWO TIMES DAILY (Patient taking differently: Take 250 mcg by mouth 2 (two) times daily. ) 180 capsule 2   ferrous sulfate (FEROSUL) 325 (65 FE) MG tablet Take 1 tablet (325 mg total) by mouth 2 (two) times daily with a meal.     furosemide (LASIX) 40 MG tablet TAKE 1 TABLET(40 MG) BY MOUTH DAILY (Patient taking differently: Take 40 mg by mouth daily. ) 90 tablet 2   irbesartan (AVAPRO) 150 MG tablet TAKE 1 TABLET(150 MG) BY MOUTH DAILY (Patient taking differently: Take 150 mg by mouth daily. TAKE 1 TABLET(150 MG) BY MOUTH DAILY) 90 tablet 3   magnesium oxide (MAG-OX) 400 MG tablet Take 400  mg by mouth daily.     Melatonin 10 MG TABS Take 10 mg by mouth at bedtime as needed (sleep).      metoprolol tartrate (LOPRESSOR) 25 MG tablet TAKE 1 TABLET BY MOUTH 2  TIMES DAILY. 180 tablet 2   Multiple Vitamins-Minerals (PRESERVISION AREDS 2 PO) Take 1 capsule by mouth 2 (two) times daily.     oxyCODONE (OXY IR/ROXICODONE) 5 MG immediate release tablet Take 1 tablet (5 mg total) by mouth every 4 (four) hours as needed for severe pain. 28 tablet 0   pantoprazole (PROTONIX) 40 MG tablet Take 40 mg by mouth daily.      PARoxetine (PAXIL) 30 MG tablet Take 30 mg by mouth at bedtime.      polycarbophil (FIBERCON) 625 MG tablet Take 1,250 mg by mouth at bedtime.      Polyethyl Glycol-Propyl Glycol (SYSTANE OP) Place 2 drops into both eyes 2 (two) times daily as needed (dry eyes).      potassium chloride (K-DUR) 10 MEQ tablet TAKE 1 TABLET BY MOUTH DAILY FOR 3 DAYS; THEN ONLY TAKE WHEN YOU TAKE THE LASIX AS NEEDED AS DIRECTED 30 tablet 3   rosuvastatin (CRESTOR) 5 MG tablet TAKE 1 TABLET BY MOUTH  EVERY MORNING (Patient taking differently: Take 5 mg by mouth daily. ) 90 tablet  3   tamsulosin (FLOMAX) 0.4 MG CAPS capsule Take 0.4 mg by mouth daily.  11   XARELTO 15 MG TABS tablet TAKE 1 TABLET BY MOUTH  DAILY WITH SUPPER 90 tablet 1   metoprolol tartrate (LOPRESSOR) 25 MG tablet Take 1-2 tablets (25-50 mg total) by mouth daily as needed (for breakthrough palpitations). 180 tablet 3   No current facility-administered medications for this visit.      Past Medical History:  Diagnosis Date   Arthritis    Atrial fibrillation, persistent    Chronic sinusitis    Coronary artery disease    Depression    Difficult intubation    was told with shoulder done 2006-alittle narrow   Dysrhythmia    A. Fib   Gait disorder 05/28/2014   Hypertension    Jejunostomy tube fell out    when asked about this in 04/2017, pt denied ever having a J tube, feeding tube, tubes placed post surgery  so ??? veracity of a previous J tube.     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   Past Surgical History:  Procedure Laterality Date   CARDIAC CATHETERIZATION     CATARACT EXTRACTION, BILATERAL     COLONOSCOPY WITH PROPOFOL N/A 04/17/2017   Procedure: COLONOSCOPY WITH PROPOFOL;  Surgeon: Yetta Flock, MD;  Location: [facility];  Service: Gastroenterology;  Laterality: N/A;   ESOPHAGEAL DILATION  2017   ESOPHAGOGASTRODUODENOSCOPY (EGD) WITH PROPOFOL N/A 04/17/2017   Procedure: ESOPHAGOGASTRODUODENOSCOPY (EGD) WITH PROPOFOL;  Surgeon: Yetta Flock, MD;  Location: [facility];  Service: Gastroenterology;  Laterality: N/A;   EYE SURGERY     FOOT NEUROMA SURGERY  2002   LEFT HEART CATHETERIZATION WITH CORONARY ANGIOGRAM Right 06/14/2011   20% LM, chronic occluded mid LAD, 50% ostial LCX, 20% mid RI, RCA with collaterals to mid LAD, mid 10% stenosis, EF 60% 06/14/11   NASAL SINUS SURGERY     SHOULDER ARTHROSCOPY  03/01/2011   Procedure: ARTHROSCOPY SHOULDER;  Surgeon: Ninetta Lights, MD;  Location: [facility];  Service: Orthopedics;  Laterality: Right;  arthroscopy shoulder decompression subacromial partial acromioplasty with coracoacromial release, distal claviculectomy, debridement of labrium   SHOULDER ARTHROSCOPY  03/01/2011   Procedure: ARTHROSCOPY SHOULDER;  Surgeon: Ninetta Lights, MD;  Location: [facility];  Service: Orthopedics;  Laterality: Right;  arthroscopy shoulder decompression subacromial partial acromioplasty with coracoacromial release, distal claviculectomy, debridement of labrium   TEE WITHOUT CARDIOVERSION N/A 03/19/2016    Procedure: TRANSESOPHAGEAL ECHOCARDIOGRAM (TEE);  Surgeon: Larey Dresser, MD;  Location: [facility];  Service: Cardiovascular;  Laterality: N/A;   TOTAL KNEE ARTHROPLASTY Right 09/23/2013   Procedure: TOTAL KNEE ARTHROPLASTY;  Surgeon: Ninetta Lights, MD;  Location: [facility];  Service: Orthopedics;  Laterality: Right;   TOTAL SHOULDER ARTHROPLASTY  06/28/2011   Procedure: TOTAL SHOULDER ARTHROPLASTY;  Surgeon: Ninetta Lights, MD;  Location: [facility];  Service: Orthopedics;  Laterality: Right;   UVULOPALATOPHARYNGOPLASTY     VIDEO ASSISTED THORACOSCOPY (VATS)/WEDGE RESECTION Right 06/02/2018   Procedure: VIDEO ASSISTED THORACOSCOPY (VATS)/WEDGE RESECTION with Lymph node disection and intercostal nerve block.;  Surgeon: Servando Snare,  Lilia Argue, MD;  Location: [facility];  Service: Thoracic;  Laterality: Right;     Family History  Problem Relation Age of Onset   Cancer Mother        colon   Heart attack Father    Migraines Brother      Social History   Socioeconomic History   Marital status: Married    Spouse name: Not on file   Number of children: 3   Years of education: Not on file   Highest education level: Not on file  Occupational History   Occupation: retired  Scientist, product/process development strain: Not on file   Food insecurity    Worry: Not on file    Inability: Not on Lexicographer needs    Medical: Not on file    Non-medical: Not on file  Tobacco Use   Smoking status: Former Smoker    Packs/day: 2.00    Years: 30.00    Pack years: 60.00    Types: Cigarettes    Quit date: 02/27/1998    Years since quitting: 20.5   Smokeless tobacco: Never Used  Substance and Sexual Activity   Alcohol use: Yes    Alcohol/week: 14.0 standard drinks    Types: 14 Standard drinks or equivalent per week    Comment: 2 drinks daily ([place])   Drug use: No   Sexual activity: Not on file  Lifestyle   Physical activity    Days per week: Not on  file    Minutes per session: Not on file   Stress: Not on file  Relationships   Social connections    Talks on phone: Not on file    Gets together: Not on file    Attends religious service: Not on file    Active member of club or organization: Not on file    Attends meetings of clubs or organizations: Not on file    Relationship status: Not on file   Intimate partner violence    Fear of current or ex partner: Not on file    Emotionally abused: Not on file    Physically abused: Not on file    Forced sexual activity: Not on file  Other Topics Concern   Not on file  Social History Narrative   Patient is left handed.   Patient drinks one cup caffeine daily.     BP 102/66    Pulse 70    Ht 5\' 9"  (1.753 m)    Wt 206 lb (93.4 kg)    SpO2 97%    BMI 30.42 kg/m   Physical Exam:  Well appearing NAD HEENT: Unremarkable Neck:  No JVD, no thyromegally Lymphatics:  No adenopathy Back:  No CVA tenderness Lungs:  Clear HEART:  Regular rate rhythm, no murmurs, no rubs, no clicks Abd:  soft, positive bowel sounds, no organomegally, no rebound, no guarding Ext:  2 plus pulses, no edema, no cyanosis, no clubbing Skin:  No rashes no nodules Neuro:  CN II through XII intact, motor grossly intact  EKG - NSR with QT prolongation   Assess/Plan: 1. Palpitations - his symptoms are not ideally controlled. I have asked him to take an additional metoprolol if his symptoms are not controlled. He will continue dofetilide. 2. PAF - his atrial fib is fairly well controlled. He will continue his current meds. 3. HTN - his BP is a little low today. He will continue his current meds.  Mikle Bosworth.D.

## 2018-09-09 NOTE — Patient Instructions (Addendum)
Medication Instructions:  Your physician recommends that you continue on your current medications as directed. Please refer to the Current Medication list given to you today.  You may take an additional 25-50 mg of metoprolol tartrate by mouth as needed for breakthrough palpitations.  Labwork: None ordered.  Testing/Procedures: None ordered.  Follow-Up: Your physician wants you to follow-up in: 6 months with Dr. Lovena Le.   You will receive a reminder letter in the mail two months in advance. If you don't receive a letter, please call our office to schedule the follow-up appointment.   Any Other Special Instructions Will Be Listed Below (If Applicable).  If you need a refill on your cardiac medications before your next appointment, please call your pharmacy.

## 2018-09-16 ENCOUNTER — Ambulatory Visit: Payer: Medicare Other | Admitting: Internal Medicine

## 2018-09-25 ENCOUNTER — Other Ambulatory Visit: Payer: Self-pay

## 2018-09-25 ENCOUNTER — Encounter (INDEPENDENT_AMBULATORY_CARE_PROVIDER_SITE_OTHER): Payer: Medicare Other | Admitting: Ophthalmology

## 2018-09-25 DIAGNOSIS — H35033 Hypertensive retinopathy, bilateral: Secondary | ICD-10-CM

## 2018-09-25 DIAGNOSIS — I10 Essential (primary) hypertension: Secondary | ICD-10-CM | POA: Diagnosis not present

## 2018-09-25 DIAGNOSIS — H353211 Exudative age-related macular degeneration, right eye, with active choroidal neovascularization: Secondary | ICD-10-CM | POA: Diagnosis not present

## 2018-09-25 DIAGNOSIS — H353122 Nonexudative age-related macular degeneration, left eye, intermediate dry stage: Secondary | ICD-10-CM | POA: Diagnosis not present

## 2018-09-25 DIAGNOSIS — H43813 Vitreous degeneration, bilateral: Secondary | ICD-10-CM

## 2018-09-25 DIAGNOSIS — D3131 Benign neoplasm of right choroid: Secondary | ICD-10-CM

## 2018-10-13 ENCOUNTER — Other Ambulatory Visit (HOSPITAL_COMMUNITY): Payer: Self-pay | Admitting: Interventional Radiology

## 2018-10-13 DIAGNOSIS — I771 Stricture of artery: Secondary | ICD-10-CM

## 2018-10-14 ENCOUNTER — Ambulatory Visit (INDEPENDENT_AMBULATORY_CARE_PROVIDER_SITE_OTHER): Payer: Medicare Other | Admitting: Internal Medicine

## 2018-10-14 ENCOUNTER — Other Ambulatory Visit: Payer: Self-pay

## 2018-10-14 ENCOUNTER — Encounter: Payer: Self-pay | Admitting: Internal Medicine

## 2018-10-14 DIAGNOSIS — R0609 Other forms of dyspnea: Secondary | ICD-10-CM

## 2018-10-14 DIAGNOSIS — I2510 Atherosclerotic heart disease of native coronary artery without angina pectoris: Secondary | ICD-10-CM

## 2018-10-14 NOTE — Assessment & Plan Note (Signed)
Onset 2017 PFTs  12/16/15  FVC  3.32 (81%) no obst and DLCO 59/58c and 77% with correction for alv vol - Spirometry 01/20/2016  FVC  3.12 (74%) and exp truncation  - 01/20/2016  Walked RA x 3 laps @ 185 ft each stopped due to  End of study, nl pace, mild sob with cough at end  - HRCT CT 04/09/2017 1. Previously noted nodules are stable compared to the prior study. These are strongly favored to be benign. The nodular area of architectural distortion in the posterior aspect of the right lower lobe is most likely an area of post infectious or inflammatory scarring. Repeat noncontrast chest CT could be considered in 12 months to ensure continued stability. 2. Mild diffuse bronchial wall thickening with mild to moderate centrilobular and paraseptal emphysema. 3. Aortic atherosclerosis, in addition to left main and 3 vessel coronary artery disease - 04/15/2017   Walked RA x one lap @ 185 stopped due sob with ok sats but Hgb 6.9 > sent to ER as on xarelto with evidence of possible fluid overload on exam  - PFT's  05/15/2017  FEV1 2.31 (79 % ) ratio 77  p 11 % improvement from saba p 0 prior to study with DLCO  59/63 % corrects to 94  % for alv volume  - 04/22/2018   Walked RA  2 laps @  approx 246ft each @ fast pace  stopped due to  End of study with sats 91%   - Spirometry 04/22/2018  FEV1 1.6 (56%)  Ratio 0.74 min curvature s prior rx    He has restrictive not obst pattern that is c/w body habitus and post op status and no longer desats walking so should be safe to d/c 02   Advised to keep track of 02 with exertion and call if losing ground in an way   Pulmonary f/u can be prn

## 2018-10-14 NOTE — Progress Notes (Signed)
Subjective:    Patient ID: George Vega, male    DOB: [birth year],    MRN: [id number]  Brief patient profile:   33 yowm quit smoking 1999 slowed down by arthritis but new waking from sleeping with sob x around 11/2015 referred to pulmonary clinic 01/20/2016 by Dr  Lovena Le with abn pfts 12/16/15      History of Present Illness  01/20/2016 1st [facility] office visit/ Wert   Chief Complaint  Patient presents with  . Pulmonary Consult    Referred by Dr. Lovena Le. Pt c/o SOB off and on for the past 2 months. He states it mainly bothers him when he lies down. He occ gets SOB after he exerts himself.    wakes up sob x 2 months once a week x 20 sec, couple dep breaths and feels better.  Also wife hears noisy breathing at rest during same time period/ pt has sense of pnds and freq clearing his throat daytime only  He's more concerned with fatigue limiting activity tol than sob  Note has h/o acei cough remotely, resolved on arb rec Omeprazole 40   Take  30-60 min before first meal of the day and Pepcid (famotidine)  20 mg one @  bedtime until return to office -did not do GERD  Diet ? followed Please remember to go to the lab Allergy profile 01/20/2016 >  Eos 0.6 /  IgE  319 with RAST Mold > dog ? Low grade ABPA ? > rec f/u to discuss options but did not return and not interested in allergy eval or rec to keep dogs out of bedroom       04/15/2017  f/u ov/Wert re:  MPNs / worse doe x one month Chief Complaint  Patient presents with  . Follow-up    Pt states he is having worsening SOB mainly with exertion. Denies any cough or CP.  Dyspnea:  MMRC3 = can't walk 100 yards even at a slow pace at a flat grade s stopping due to sob   Cough: no Sleep: ok L side down / 6 in under hob > once settles down does ok  Has had increased swelling L leg and pallor over last month also / no obvious bleeding on xarelto  rec Pantoprazole should be Take 30-60 min before first meal of the day  Please  remember to go to the lab department downstairs in the basement  for your tests - we will call you with the results when they are available. hgb 6.9 > admit  Admit date: 04/15/2017 Discharge date: 04/18/2017  Time spent: 45 minutes  Recommendations for Outpatient Follow-up:  Patient will be discharged to home.  Patient will need to follow up with primary care provider within one week of discharge.  Follow-up with Dr. Beryle Beams, hematologist.  Follow-up with Dr. Havery Moros, gastroenterologist.  Patient should continue medications as prescribed.  Patient should follow a heart healthy diet.   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 Follow-up    PFT's today. Breathing has improved back to his normal baseline. No new co's.   Dyspnea:  Steps ok now, really Not limited by breathing from desired activities   Cough: no Sleep: ok 5 in blocks  rec F/u fr ct's  only    04/22/2018  f/u ov/Wert re: doe/ abn cxr  Chief Complaint  Patient presents with  . Follow-up    Here to discuss PET scan results. He has occ SOB that comes and goes.   Dyspnea:  Very sedentary / ok flat/ most he does = 3 steps no problems/ legs tend to stop him before his breathing  Cough: minimal / mucoid in am never bloody  Sleeping: lie flat one pillow  SABA use: none 02: none  rec CT bx RLL  04/29/2018 >>> Sq cell ca > referred to [facility] 05/01/2018 > RLL sup segmentectomy 06/02/2018 Servando Snare) - no adjuvant rx needed    10/14/2018  f/u ov/Wert re: doe with transient hypoxemia post op f/u segmentectomy on R not using 02  Chief Complaint  Patient presents with  . Follow-up    Breathing is doing well and no new co's.   Dyspnea:  Walks 10 min 4  x weekly nl pace knees limit with sats 93-95 most days  Cough: none Sleeping: flat rotated to L / one pillow SABA use: no 02: none    No obvious day to day or daytime variability or assoc excess/ purulent sputum or mucus plugs or hemoptysis or cp or chest tightness, subjective wheeze or overt sinus or hb symptoms.   Sleeping as above without nocturnal  or early am exacerbation  of respiratory  c/o's or need for noct saba. Also denies any obvious fluctuation of symptoms with weather or environmental changes or other aggravating or alleviating factors except as outlined above   No unusual exposure hx or h/o childhood pna/ asthma or knowledge of premature birth.  Current Allergies, Complete Past Medical History, Past Surgical History, Family History, and Social History were reviewed in [employer] record.  ROS  The following are not active complaints unless bolded Hoarseness, sore throat, dysphagia, dental problems, itching, sneezing,  nasal congestion or discharge of excess mucus or purulent secretions, ear ache,   fever, chills, sweats, unintended wt loss or wt gain, classically pleuritic or exertional cp,  orthopnea pnd or arm/hand swelling  or leg swelling, presyncope, palpitations, abdominal pain, anorexia, nausea, vomiting, diarrhea  or change in bowel habits or change in bladder habits, change in stools or change in urine, dysuria, hematuria,  rash, arthralgias, visual complaints, headache, numbness, weakness or ataxia or problems with walking or coordination,  change in mood or  memory.        Current Meds  Medication Sig  . acetaminophen (TYLENOL) 325 MG tablet Take 650 mg by mouth every 6 (six) hours as needed for moderate pain.  . Cholecalciferol (VITAMIN D3) 5000 units TABS Take 5,000 Units by mouth daily.  . Cyanocobalamin (VITAMIN B-12) 500 MCG SUBL Place 500 mcg under the tongue 3 (three) times a week.   . dofetilide (TIKOSYN) 250 MCG capsule TAKE 1 CAPSULE  BY MOUTH TWO TIMES DAILY (Patient taking differently: Take 250 mcg by mouth 2 (two) times daily. )  . ferrous sulfate (FEROSUL) 325 (65 FE) MG tablet Take 1 tablet (325 mg total) by mouth 2 (two) times daily with a meal.  . furosemide (LASIX) 40 MG tablet TAKE 1 TABLET(40 MG) BY MOUTH DAILY (Patient taking differently: Take 40 mg by mouth daily. )  . irbesartan (AVAPRO) 150 MG tablet TAKE 1 TABLET(150 MG) BY MOUTH DAILY (Patient taking differently: Take 150 mg by mouth daily. TAKE 1 TABLET(150 MG) BY MOUTH DAILY)  . magnesium oxide (MAG-OX) 400 MG tablet Take  400 mg by mouth daily.  . Melatonin 10 MG TABS Take 10 mg by mouth at bedtime as needed (sleep).   . metoprolol tartrate (LOPRESSOR) 25 MG tablet TAKE 1 TABLET BY MOUTH 2  TIMES DAILY.  . metoprolol tartrate (LOPRESSOR) 25 MG tablet Take 1-2 tablets (25-50 mg total) by mouth daily as needed (for breakthrough palpitations).  . Multiple Vitamins-Minerals (PRESERVISION AREDS 2 PO) Take 1 capsule by mouth 2 (two) times daily.  Marland Kitchen oxyCODONE (OXY IR/ROXICODONE) 5 MG immediate release tablet Take 1 tablet (5 mg total) by mouth every 4 (four) hours as needed for severe pain.  . pantoprazole (PROTONIX) 40 MG tablet Take 40 mg by mouth daily.   Marland Kitchen PARoxetine (PAXIL) 30 MG tablet Take 30 mg by mouth at bedtime.   . polycarbophil (FIBERCON) 625 MG tablet Take 1,250 mg by mouth at bedtime.   [name] Glycol-Propyl Glycol (SYSTANE OP) Place 2 drops into both eyes 2 (two) times daily as needed (dry eyes).   . potassium chloride (K-DUR) 10 MEQ tablet TAKE 1 TABLET BY MOUTH DAILY FOR 3 DAYS; THEN ONLY TAKE WHEN YOU TAKE THE LASIX AS NEEDED AS DIRECTED  . rosuvastatin (CRESTOR) 5 MG tablet TAKE 1 TABLET BY MOUTH  EVERY MORNING (Patient taking differently: Take 5 mg by mouth daily. )  . tamsulosin (FLOMAX) 0.4 MG CAPS capsule Take 0.4 mg by mouth daily.  Alveda Reasons 15 MG TABS tablet TAKE 1 TABLET BY MOUTH  DAILY WITH SUPPER                  Objective:    Physical Exam  amb pleasant  wm nad   10/14/2018       209 04/22/2018     213  05/15/2017        212   04/15/2017        222  10/09/2016      222   01/20/16 218 lb 9.6 oz (99.2 kg)  12/14/15 212 lb (96.2 kg)  11/17/15 211 lb 4.8 oz (95.8 kg)      Vital signs reviewed - Note on arrival 02 sats  95% on RA        HEENT: nl dentition, turbinates bilaterally, and oropharynx. Nl external ear canals without cough reflex   NECK :  without JVD/Nodes/TM/ nl carotid upstrokes bilaterally   LUNGS: no acc muscle use,  Nl contour chest which is clear to A and P bilaterally without cough on insp or exp maneuvers   CV:  IRIR   no s3 or murmur or increase in P2, and no edema   ABD:  soft and nontender with nl inspiratory excursion in the supine position. No bruits or organomegaly appreciated, bowel sounds nl  MS:  Nl gait/ ext warm without deformities, calf tenderness, cyanosis or clubbing No obvious joint restrictions   SKIN: warm and dry without lesions    NEURO:  alert, approp, nl sensorium with  no motor or cerebellar deficits apparent.              Assessment & Plan:

## 2018-10-14 NOTE — Patient Instructions (Addendum)
To get the most out of exercise, you need to be continuously aware that you are short of breath, but never out of breath, for 30 minutes daily. As you improve, it will actually be easier for you to do the same amount of exercise  in  30 minutes so always push to the level where you are short of breath.   Observe your 02 saturation at peak of exercise   Ok to stop oxygen as needed   Follow up is here as needed

## 2018-10-22 ENCOUNTER — Other Ambulatory Visit: Payer: Self-pay | Admitting: Internal Medicine

## 2018-10-30 ENCOUNTER — Ambulatory Visit (INDEPENDENT_AMBULATORY_CARE_PROVIDER_SITE_OTHER): Payer: Medicare Other | Admitting: Cardiothoracic Surgery

## 2018-10-30 ENCOUNTER — Other Ambulatory Visit: Payer: Self-pay

## 2018-10-30 VITALS — BP 106/73 | HR 108 | Temp 97.5°F | Resp 16 | Ht 70.0 in | Wt 209.0 lb

## 2018-10-30 DIAGNOSIS — C3431 Malignant neoplasm of lower lobe, right bronchus or lung: Secondary | ICD-10-CM

## 2018-10-30 DIAGNOSIS — Z09 Encounter for follow-up examination after completed treatment for conditions other than malignant neoplasm: Secondary | ICD-10-CM

## 2018-10-30 DIAGNOSIS — C3491 Malignant neoplasm of unspecified part of right bronchus or lung: Secondary | ICD-10-CM | POA: Diagnosis not present

## 2018-10-30 DIAGNOSIS — I2510 Atherosclerotic heart disease of native coronary artery without angina pectoris: Secondary | ICD-10-CM | POA: Diagnosis not present

## 2018-10-30 NOTE — Progress Notes (Signed)
[address]       [place] [address]             [phone]        Evaluation Performed:  Follow-up visit [facility] Record #[id number] Date of Birth: [birth year]  Referring HD:QQIW, Christena Deem, MD Primary Cardiology: Primary Care:Miller, Lattie Haw, MD  Chief Complaint:  Follow Up Visit  Cancer Staging Lung cancer [facility]) Staging form: Lung, AJCC 8th Edition - Clinical: No stage assigned - Unsigned - Pathologic stage from 06/05/2018: Stage IB (pT2a, pN0, cM0) - Signed by [name], MD on 06/05/2018   OPERATIVE REPORT DATE OF PROCEDURE:  06/02/2018 PREOPERATIVE DIAGNOSIS:  Squamous cell carcinoma of the right lower lobe. POSTOPERATIVE DIAGNOSIS:  Squamous cell carcinoma of the right lower lobe. SURGICAL PROCEDURES:  Right video-assisted thoracoscopy,  superior segmentectomy, right lower lobe with lymph node dissection and intercostal nerve block, right 4, 5, 6 and 7 intercostal spaces. 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  Initially the patient was discharged home on home oxygen.  His respiratory functional status is continued to improve he notes that he no longer uses oxygen.  He has increased his physical activity appropriately, and feels comfortable with his activities around the house.  His major limiting factor now is his knees especially left knee pain.      Zubrod Score: At the time of surgery this patient's most appropriate activity status/level should be described as: []     0    Normal activity, no symptoms [x]     1    Restricted in physical strenuous activity but ambulatory, able to do out light work []     2    Ambulatory and capable of self care, unable to do work activities, up and about                 >50 % of waking hours                                                                                    []     3    Only limited self care, in bed greater than 50% of waking hours []     4    Completely disabled, no self care, confined to bed or chair []     5    Moribund  Social History   Tobacco Use  Smoking Status Former Smoker  . Packs/day: 2.00  . Years: 30.00  . Pack years: 60.00  . Types: Cigarettes  . Quit date: 02/27/1998  . Years since quitting: 20.6  Smokeless Tobacco Never Used       Allergies  Allergen Reactions  . Glucosamine-Chondroitin Anaphylaxis and Other (See Comments)    Stomach cramps, can't eat   . Tetanus Toxoid Anaphylaxis and Hives    REACTION: hives/throat swells shut   . Fish Oil Other (See Comments)    Stomach cramps, can't eat   . Ibuprofen Nausea  Only and Other (See Comments)    Stomach pains   . Naproxen Diarrhea    Current Outpatient Medications  Medication Sig Dispense Refill  . acetaminophen (TYLENOL) 325 MG tablet Take 650 mg by mouth every 6 (six) hours as needed for moderate pain.    . Cholecalciferol (VITAMIN D3) 5000 units TABS Take 5,000 Units by mouth daily.    . Cyanocobalamin (VITAMIN B-12) 500 MCG SUBL Place 500 mcg under the tongue 3 (three) times a week.     . dofetilide (TIKOSYN) 250 MCG capsule TAKE 1 CAPSULE BY MOUTH TWO TIMES DAILY (Patient taking differently: Take 250 mcg by mouth 2 (two) times daily. ) 180 capsule 2  . ferrous sulfate (FEROSUL) 325 (65 FE) MG tablet Take 1 tablet (325 mg total) by mouth 2 (two) times daily with a meal.    . furosemide (LASIX) 40 MG tablet TAKE 1 TABLET(40 MG) BY MOUTH DAILY (Patient taking differently: Take 40 mg by mouth daily. ) 90 tablet 2  . irbesartan (AVAPRO) 150 MG tablet TAKE 1 TABLET(150 MG) BY MOUTH DAILY (Patient taking differently: Take 150 mg by mouth daily. TAKE 1 TABLET(150 MG) BY MOUTH DAILY) 90 tablet 3  . magnesium oxide (MAG-OX) 400 MG tablet Take 400 mg by mouth daily.    . Melatonin 10 MG  TABS Take 10 mg by mouth at bedtime as needed (sleep).     . metoprolol tartrate (LOPRESSOR) 25 MG tablet TAKE 1 TABLET BY MOUTH 2  TIMES DAILY. 180 tablet 2  . metoprolol tartrate (LOPRESSOR) 25 MG tablet Take 1-2 tablets (25-50 mg total) by mouth daily as needed (for breakthrough palpitations). 180 tablet 3  . Multiple Vitamins-Minerals (PRESERVISION AREDS 2 PO) Take 1 capsule by mouth 2 (two) times daily.    Marland Kitchen oxyCODONE (OXY IR/ROXICODONE) 5 MG immediate release tablet Take 1 tablet (5 mg total) by mouth every 4 (four) hours as needed for severe pain. 28 tablet 0  . pantoprazole (PROTONIX) 40 MG tablet Take 40 mg by mouth daily.     Marland Kitchen PARoxetine (PAXIL) 30 MG tablet Take 30 mg by mouth at bedtime.     . polycarbophil (FIBERCON) 625 MG tablet Take 1,250 mg by mouth at bedtime.     [name] Glycol-Propyl Glycol (SYSTANE OP) Place 2 drops into both eyes 2 (two) times daily as needed (dry eyes).     . potassium chloride (K-DUR) 10 MEQ tablet TAKE 1 TABLET BY MOUTH DAILY FOR 3 DAYS; THEN ONLY TAKE WHEN YOU TAKE THE LASIX AS NEEDED AS DIRECTED 30 tablet 3  . rosuvastatin (CRESTOR) 5 MG tablet TAKE 1 TABLET BY MOUTH  EVERY MORNING 90 tablet 3  . tamsulosin (FLOMAX) 0.4 MG CAPS capsule Take 0.4 mg by mouth daily.  11  . XARELTO 15 MG TABS tablet TAKE 1 TABLET BY MOUTH  DAILY WITH SUPPER 90 tablet 1   No current facility-administered medications for this visit.        Physical Exam: BP 106/73 (BP Location: Right Arm, Patient Position: Sitting, Cuff Size: Normal)   Pulse (!) 108   Temp (!) 97.5 F (36.4 C)   Resp 16   Ht 5\' 10"  (1.778 m)   Wt 209 lb (94.8 kg)   SpO2 93% Comment: RA  BMI 29.99 kg/m  General appearance: alert, cooperative and no distress Head: Normocephalic, without obvious abnormality, atraumatic Neck: no adenopathy, no carotid bruit, no JVD, supple, symmetrical, trachea midline and thyroid not enlarged, symmetric, no tenderness/mass/nodules Lymph nodes:  Cervical,  supraclavicular, and axillary nodes normal. Resp: clear to auscultation bilaterally Back: symmetric, no curvature. ROM normal. No CVA tenderness. Cardio: regular rate and rhythm, S1, S2 normal, no murmur, click, rub or gallop GI: soft, non-tender; bowel sounds normal; no masses,  no organomegaly Extremities: extremities normal, atraumatic, no cyanosis or edema and Homans sign is negative, no sign of DVT Neurologic: Grossly normal  Wounds: Right chest port and incision sites are well-healed Diagnostic Studies & Laboratory data:         Recent Radiology Findings:   Recent Labs: Lab Results  Component Value Date   WBC 10.0 07/07/2018   HGB 13.2 07/07/2018   HCT 41.7 07/07/2018   PLT 207 07/07/2018   GLUCOSE 119 (H) 07/07/2018   ALT 26 07/07/2018   AST 27 07/07/2018   NA 139 07/07/2018   K 3.8 07/07/2018   CL 101 07/07/2018   CREATININE 0.85 07/07/2018   BUN 14 07/07/2018   CO2 28 07/07/2018   TSH 1.43 04/15/2017   INR 1.1 06/02/2018      Assessment / Plan:   Patient now 4 months after resection of 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  No evidence of recurrence on physical exam, patient scheduled to have a repeat CT scan of the chest done through the cancer center in October.  To stagger his visits and I will plan to see back in January 2021    Medication Changes: No orders of the defined types were placed in this encounter.     [name] 10/30/2018 11:56 AM

## 2018-10-31 ENCOUNTER — Ambulatory Visit (HOSPITAL_COMMUNITY)
Admission: RE | Admit: 2018-10-31 | Discharge: 2018-10-31 | Disposition: A | Discharge status: Home / Self Care | Payer: Medicare Other | Source: Ambulatory Visit | Attending: Interventional Radiology | Admitting: Interventional Radiology

## 2018-10-31 DIAGNOSIS — I771 Stricture of artery: Secondary | ICD-10-CM | POA: Insufficient documentation

## 2018-10-31 NOTE — Progress Notes (Signed)
Carotid artery duplex has been completed. Preliminary results can be found in CV Proc through chart review.   10/31/18 1:42 PM George Vega RVT

## 2018-11-03 ENCOUNTER — Telehealth (HOSPITAL_COMMUNITY): Payer: Self-pay

## 2018-11-03 NOTE — Telephone Encounter (Signed)
Pt agreed to f/u in 6 months with us carotid. AW 

## 2018-11-18 ENCOUNTER — Encounter: Payer: Self-pay | Admitting: Cardiothoracic Surgery

## 2018-11-20 MED ORDER — METOPROLOL TARTRATE 25 MG PO TABS: 25.0000 mg | ORAL_TABLET | Freq: Every day | ORAL | 3 refills | Status: DC | PRN

## 2018-11-21 ENCOUNTER — Telehealth: Payer: Self-pay | Admitting: *Deleted

## 2018-11-21 NOTE — Telephone Encounter (Signed)
-----   Message from Damian Leavell, RN sent at 11/18/2018  4:49 PM EDT ----- Regarding: afib ablation You saw this Pt last year.  At that time I think he was considering ablation, but maybe wanted to wait.  I think he is ready now.  I'm sure you will want to see him again.  Let me know if you need me to do anything to help.  Sonia Baller

## 2018-11-21 NOTE — Telephone Encounter (Signed)
Pt scheduled for OV on 9/21 w/ Dr. Curt Bears to discuss ablation. Patient verbalized understanding and agreeable to plan.

## 2018-11-25 ENCOUNTER — Other Ambulatory Visit: Payer: Self-pay | Admitting: Internal Medicine

## 2018-11-27 ENCOUNTER — Telehealth: Payer: Self-pay | Admitting: Cardiology

## 2018-11-27 NOTE — Telephone Encounter (Signed)
New Message    Patient would like wife to attend appointment with him please call to discuss.

## 2018-11-28 NOTE — Telephone Encounter (Signed)
Wife is caretaker, takes care of all medical. Informed ok for wife to come up with pt. Pt appreciates the return call and consideration in this matter.

## 2018-12-01 ENCOUNTER — Encounter: Payer: Self-pay | Admitting: Cardiology

## 2018-12-01 ENCOUNTER — Other Ambulatory Visit: Payer: Self-pay

## 2018-12-01 ENCOUNTER — Ambulatory Visit (INDEPENDENT_AMBULATORY_CARE_PROVIDER_SITE_OTHER): Payer: Medicare Other | Admitting: Cardiology

## 2018-12-01 VITALS — BP 92/60 | HR 77 | Ht 70.0 in | Wt 209.6 lb

## 2018-12-01 DIAGNOSIS — I2510 Atherosclerotic heart disease of native coronary artery without angina pectoris: Secondary | ICD-10-CM

## 2018-12-01 DIAGNOSIS — I480 Paroxysmal atrial fibrillation: Secondary | ICD-10-CM

## 2018-12-01 DIAGNOSIS — I4819 Other persistent atrial fibrillation: Secondary | ICD-10-CM | POA: Diagnosis not present

## 2018-12-01 DIAGNOSIS — Z01812 Encounter for preprocedural laboratory examination: Secondary | ICD-10-CM | POA: Diagnosis not present

## 2018-12-01 NOTE — Progress Notes (Signed)
Electrophysiology Office Note   Date:  12/01/2018   ID:  [place], Bolds 28-Mar-1940, MRN [id number]  PCP:  [name], MD  Cardiologist:  Lovena Le Primary Electrophysiologist: Lovena Le    No chief complaint on file.    History of Present Illness: George Vega is a 78 y.o. male who is being seen today for the evaluation of atrial fibrillation at the request of Cristopher Peru. Presenting today for electrophysiology evaluation.  History of hypertension, PAF, acute on chronic diastolic heart failure, and history of GI bleeding.  He is currently anticoagulated and has had no additional bleeding.  He has been on dofetilide for approximately 1 year and had multiple episodes of atrial fibrillation in May.  He has had none since that time.  He is being referred for discussion of ablation.  His symptoms of atrial fibrillation or mild chest discomfort and shortness of breath.  Today, denies symptoms of palpitations, chest pain, shortness of breath, orthopnea, PND, lower extremity edema, claudication, dizziness, presyncope, syncope, bleeding, or neurologic sequela. The patient is tolerating medications without difficulties.  He had an episode of atrial fibrillation for the entirety of the Labor Day weekend.  He converted on his own.  Since then he has felt well.  He is now interested in ablation.  Past Medical History:  Diagnosis Date  . Arthritis   . Atrial fibrillation, persistent   . Chronic sinusitis   . Coronary artery disease   . Depression   . Difficult intubation    was told with shoulder done 2006-alittle narrow  . Dysrhythmia    A. Fib  . 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 Hypertension   . Jejunostomy tube fell out    when asked about this in 04/2017, pt denied ever having a J tube, feeding tube, tubes placed post surgery so ??? veracity of a previous J tube.    . 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 Spastic colon   . Squamous cell carcinoma of lung, stage  I, right ([place]) 05/07/2018   bx 04/29/18; isolated PET uptake in RLL mass  . Stroke (cerebrum) ([place])   . SVT (supraventricular tachycardia) ([facility])    Past Surgical History:  Procedure Laterality Date  . CARDIAC CATHETERIZATION    . CATARACT EXTRACTION, BILATERAL    . COLONOSCOPY WITH PROPOFOL N/A 04/17/2017   Procedure: COLONOSCOPY WITH PROPOFOL;  Surgeon: Yetta Flock, MD;  Location: [place];  Service: Gastroenterology;  Laterality: N/A;  . ESOPHAGEAL DILATION  2017  . ESOPHAGOGASTRODUODENOSCOPY (EGD) WITH PROPOFOL N/A 04/17/2017   Procedure: ESOPHAGOGASTRODUODENOSCOPY (EGD) WITH PROPOFOL;  Surgeon: Yetta Flock, MD;  Location: [place];  Service: Gastroenterology;  Laterality: N/A;  . EYE SURGERY    . FOOT NEUROMA SURGERY  2002  . LEFT HEART CATHETERIZATION WITH CORONARY ANGIOGRAM Right 06/14/2011   20% LM, chronic occluded mid LAD, 50% ostial LCX, 20% mid RI, RCA with collaterals to mid LAD, mid 10% stenosis, EF 60% 06/14/11  . NASAL SINUS SURGERY    . SHOULDER ARTHROSCOPY  03/01/2011   Procedure: ARTHROSCOPY SHOULDER;  Surgeon: Ninetta Lights, MD;  Location: [place];  Service: Orthopedics;  Laterality: Right;  arthroscopy shoulder decompression subacromial partial acromioplasty with coracoacromial release, distal claviculectomy, debridement of labrium  . SHOULDER ARTHROSCOPY  03/01/2011   Procedure: ARTHROSCOPY SHOULDER;  Surgeon: Ninetta Lights, MD;  Location: [place];  Service: Orthopedics;  Laterality: Right;  arthroscopy shoulder decompression subacromial partial acromioplasty  with coracoacromial release, distal claviculectomy, debridement of labrium  . TEE WITHOUT CARDIOVERSION N/A 03/19/2016   Procedure: TRANSESOPHAGEAL ECHOCARDIOGRAM (TEE);  Surgeon: Larey Dresser, MD;  Location: [facility];  Service: Cardiovascular;  Laterality: N/A;  . TOTAL KNEE ARTHROPLASTY Right 09/23/2013   Procedure: TOTAL KNEE ARTHROPLASTY;  Surgeon:  Ninetta Lights, MD;  Location: [facility];  Service: Orthopedics;  Laterality: Right;  . TOTAL SHOULDER ARTHROPLASTY  06/28/2011   Procedure: TOTAL SHOULDER ARTHROPLASTY;  Surgeon: Ninetta Lights, MD;  Location: [place];  Service: Orthopedics;  Laterality: Right;  . UVULOPALATOPHARYNGOPLASTY    . VIDEO ASSISTED THORACOSCOPY (VATS)/WEDGE RESECTION Right 06/02/2018   Procedure: VIDEO ASSISTED THORACOSCOPY (VATS)/WEDGE RESECTION with Lymph node disection and intercostal nerve block.;  Surgeon: [name], MD;  Location[facility];  Service: Thoracic;  Laterality: Right;     Current Outpatient Medications  Medication Sig Dispense Refill  . acetaminophen (TYLENOL) 325 MG tablet Take 650 mg by mouth every 6 (six) hours as needed for moderate pain.    . Cholecalciferol (VITAMIN D3) 5000 units TABS Take 5,000 Units by mouth daily.    . Cyanocobalamin (VITAMIN B-12) 500 MCG SUBL Place 500 mcg under the tongue 3 (three) times a week.     . dofetilide (TIKOSYN) 250 MCG capsule TAKE 1 CAPSULE BY MOUTH TWO TIMES DAILY 180 capsule 2  . ferrous sulfate (FEROSUL) 325 (65 FE) MG tablet Take 1 tablet (325 mg total) by mouth 2 (two) times daily with a meal.    . furosemide (LASIX) 40 MG tablet TAKE 1 TABLET(40 MG) BY MOUTH DAILY 90 tablet 2  . irbesartan (AVAPRO) 150 MG tablet TAKE 1 TABLET(150 MG) BY MOUTH DAILY 90 tablet 3  . magnesium oxide (MAG-OX) 400 MG tablet Take 400 mg by mouth daily.    . Melatonin 10 MG TABS Take 10 mg by mouth at bedtime as needed (sleep).     . metoprolol tartrate (LOPRESSOR) 25 MG tablet TAKE 1 TABLET BY MOUTH 2  TIMES DAILY. 180 tablet 2  . metoprolol tartrate (LOPRESSOR) 25 MG tablet Take 1-2 tablets (25-50 mg total) by mouth daily as needed (for breakthrough palpitations). 180 tablet 3  . Multiple Vitamins-Minerals (PRESERVISION AREDS 2 PO) Take 1 capsule by mouth 2 (two) times daily.    . pantoprazole (PROTONIX) 40 MG tablet Take 40 mg by mouth daily.     Marland Kitchen  PARoxetine (PAXIL) 30 MG tablet Take 30 mg by mouth at bedtime.     . polycarbophil (FIBERCON) 625 MG tablet Take 1,250 mg by mouth at bedtime.     [name] Glycol-Propyl Glycol (SYSTANE OP) Place 2 drops into both eyes 2 (two) times daily as needed (dry eyes).     . potassium chloride (K-DUR) 10 MEQ tablet TAKE 1 TABLET BY MOUTH EVERY DAY FOR 3 DAYS. THEN TAKE ONLY WHEN TAKING LASIX AS DIRECTED. 30 tablet 3  . rosuvastatin (CRESTOR) 5 MG tablet TAKE 1 TABLET BY MOUTH  EVERY MORNING 90 tablet 3  . tamsulosin (FLOMAX) 0.4 MG CAPS capsule Take 0.4 mg by mouth daily.  11  . XARELTO 15 MG TABS tablet TAKE 1 TABLET BY MOUTH  DAILY WITH SUPPER 90 tablet 1   No current facility-administered medications for this visit.     Allergies:   Glucosamine-chondroitin, Tetanus toxoid, Fish oil, Ibuprofen, and Naproxen   Social History:  The patient  reports that he quit smoking about 20 years ago. His smoking use included cigarettes. He has  a 60.00 pack-year smoking history. He has never used smokeless tobacco. He reports current alcohol use of about 14.0 standard drinks of alcohol per week. He reports that he does not use drugs.   Family History:  The patient's family history includes Cancer in his mother; Heart attack in his father; Migraines in his brother.    ROS:  Please see the history of present illness.   Otherwise, review of systems is positive for none.   All other systems are reviewed and negative.   PHYSICAL EXAM: VS:  BP 92/60   Pulse 77   Ht 5\' 10"  (1.778 m)   Wt 209 lb 9.6 oz (95.1 kg)   SpO2 97%   BMI 30.07 kg/m  , BMI Body mass index is 30.07 kg/m. GEN: Well nourished, well developed, in no acute distress  HEENT: normal  Neck: no JVD, carotid bruits, or masses Cardiac: RRR; no murmurs, rubs, or gallops,no edema  Respiratory:  clear to auscultation bilaterally, normal work of breathing GI: soft, nontender, nondistended, + BS MS: no deformity or atrophy  Skin: warm and dry  Neuro:  Strength and sensation are intact Psych: euthymic mood, full affect  EKG:  EKG is ordered today. Personal review of the ekg ordered shows sinus rhythm, rate 77  Recent Labs: 06/06/2018: Magnesium 1.9 07/07/2018: ALT 26; BUN 14; Creatinine 0.85; Hemoglobin 13.2; Platelet Count 207; Potassium 3.8; Sodium 139    Lipid Panel  No results found for: CHOL, TRIG, HDL, CHOLHDL, VLDL, LDLCALC, LDLDIRECT   Wt Readings from Last 3 Encounters:  12/01/18 209 lb 9.6 oz (95.1 kg)  10/30/18 209 lb (94.8 kg)  10/14/18 209 lb (94.8 kg)      Other studies Reviewed: Additional studies/ records that were reviewed today include: TTE 04/17/17  Review of the above records today demonstrates:  - Left ventricle: The cavity size was normal. Wall thickness was   increased in a pattern of moderate LVH. Systolic function was   normal. The estimated ejection fraction was in the range of 60%   to 65%. Wall motion was normal; there were no regional wall   motion abnormalities. Features are consistent with a pseudonormal   left ventricular filling pattern, with concomitant abnormal   relaxation and increased filling pressure (grade 2 diastolic   dysfunction). - Mitral valve: There was mild regurgitation. - Right ventricle: The cavity size was mildly dilated. Systolic   function was mildly reduced. - Tricuspid valve: There was mild-moderate regurgitation. - Pulmonary arteries: Systolic pressure was moderately increased.   PA peak pressure: 43 mm Hg (S).    ASSESSMENT AND PLAN:  1.  Paroxysmal atrial fibrillation: Had more episodes of atrial fibrillation over the past few months.  Currently on Xarelto and dofetilide.  Due to his more frequent episodes, Boysie Bonebrake plan for ablation.  Risks and benefits were discussed which include bleeding, tamponade, heart block, stroke, damage surrounding organs.  He understands these risks and is agreed to the procedure.  This patients CHA2DS2-VASc Score and unadjusted  Ischemic Stroke Rate (% per year) is equal to 9.7 % stroke rate/year from a score of 6  Above score calculated as 1 point each if present [CHF, HTN, DM, Vascular=MI/PAD/Aortic Plaque, Age if 65-74, or Male] Above score calculated as 2 points each if present [Age > 75, or Stroke/TIA/TE]   2.  Hyperlipidemia: Continue statin  3.  GI bleeding: Currently on Xarelto.  Has had no further bleeding recently.  4.  Diastolic heart failure: Maintaining low-salt diet.  No evidence of volume overload.    Current medicines are reviewed at length with the patient today.   The patient does not have concerns regarding his medicines.  The following changes were made today:  none  Labs/ tests ordered today include:  Orders Placed This Encounter  Procedures  . CT CARDIAC MORPH/PULM VEIN W/CM&W/O CA SCORE  . CT CORONARY FRACTIONAL FLOW RESERVE DATA PREP  . CT CORONARY FRACTIONAL FLOW RESERVE FLUID ANALYSIS  . Basic metabolic panel  . CBC  . EKG 12-Lead    Disposition:   FU with Sandeep Delagarza 3 months  Signed, Tirrell Buchberger Meredith Leeds, MD  12/01/2018 11:40 AM     [facility] [address] [place] [address] [phone] (office) [phone] (fax)

## 2018-12-01 NOTE — Patient Instructions (Signed)
Medication Instructions:  Your physician recommends that you continue on your current medications as directed. Please refer to the Current Medication list given to you today.  *If you need a refill on your cardiac medications before your next appointment, please call your pharmacy.  Labwork: You will get lab work within 30 days of your procedure - between 12/16/18 - 01/07/19 :  BMP & CBC If you have labs (blood work) drawn today and your tests are completely normal, you will receive your results only by:  [place] (if you have MyChart) OR  A paper copy in the mail If you have any lab test that is abnormal or we need to change your treatment, we will call you to review the results.  Testing/Procedures: Your physician has requested that you have cardiac CT within 7 days prior to your ablation. Cardiac computed tomography (CT) is a painless test that uses an x-ray machine to take clear, detailed pictures of your heart. For further information please visit [email or web address]. Please follow instruction below located under special instructions. You will get a call from our office to schedule the date for this test.  Your physician has recommended that you have an ablation. Catheter ablation is a medical procedure used to treat some cardiac arrhythmias (irregular heartbeats). During catheter ablation, a long, thin, flexible tube is put into a blood vessel in your groin (upper thigh), or neck. This tube is called an ablation catheter. It is then guided to your heart through the blood vessel. Radio frequency waves destroy small areas of heart tissue where abnormal heartbeats may cause an arrhythmia to start. Please see the instructions below located under special instructions  Follow-Up: Your physician recommends that you schedule a follow-up appointment in: 4 weeks, after your procedure on 01/16/19, with Roderic Palau NP in the AFib clinic.  Your physician recommends that you schedule a follow  up appointment in: 3 months, after your procedure on 01/16/19, with Dr. Curt Bears.  *Please note that any paperwork needing to be filled out by the provider will need to be addressed at the front desk prior to seeing the provider. Please note that any FMLA, disability or other documents regarding health condition is subject to a $25.00 charge that must be received prior to completion of paperwork in the form of a money order or check.  Thank you for choosing [facility]!! George Curet, RN [phone]   Any Other Special Instructions Will Be Listed Below    CT INSTRUCTIONS Your cardiac CT will be scheduled at:  [facility] [address] [place], [place] [address] [phone]  Please arrive at the [facility] main entrance of [facility] at ____________, please arrive 30-45 minutes prior to test start time. Proceed to the [facility] Radiology Department (first floor) to check-in and test prep.  Please follow these instructions carefully (unless otherwise directed):  Hold all erectile dysfunction medications at least 48 hours prior to test.  On the Night Before the Test: . Be sure to Drink plenty of water. . Do not consume any caffeinated/decaffeinated beverages or chocolate 12 hours prior to your test. . Do not take any antihistamines 12 hours prior to your test.  On the Day of the Test: . Drink plenty of water. Do not drink any water within one hour of the test. . Do not eat any food 4 hours prior to the test. . You may take your regular medications prior to the test.  . Take metoprolol (Lopressor) two  hours prior to test. . HOLD Furosemide/Hydrochlorothiazide morning of the test.      After the Test: . Drink plenty of water. . After receiving IV contrast, you may experience a mild flushed feeling. This is normal. . On occasion, you may experience a mild rash up to 24 hours after the test. This is not dangerous. If this occurs, you can take Benadryl  25 mg and increase your fluid intake. . If you experience trouble breathing, this can be serious. If it is severe call 911 IMMEDIATELY. If it is mild, please call our office.   Please contact the cardiac imaging nurse navigator should you have any questions/concerns Marchia Bond, RN Navigator Cardiac Imaging [facility] [phone] Office  [phone] Cell        Electrophysiology/Ablation Procedure Instructions   You are scheduled for a(n) Atrial fibrillation ablation on 01/16/19 with Dr. Allegra Lai.   1.   Pre procedure testing-             A.  LAB WORK --- On _____________ at the [facility] office (see address at the top of this letter) for your pre procedure blood work.                 B. COVID TEST-- On 01/13/19 @ 1:00 pm - You will go to [facility] hospital ([place]) for your Covid testing.   This is a drive thru test site.  There will be multiple testing areas.  Be sure to share with the first checkpoint that you are there for pre-procedure/surgery testing. This will put you into the right (yellow) lane that leads to the PAT testing team. Stay in your car and the nurse team will come to your car to test you.  After you are tested please go home and self quarantine until the day of your procedure.     2. On the day of your procedure 01/16/19 you will go to [facility] hospital ([address]) at 5:30 am.  Dennis Bast will go to the main entrance A [employer]) and enter where the [employer] are.  Your driver will drop you off and you will head down the hallway to ADMITTING.  You may have one support person come in to the hospital with you.  They will be asked to wait in the waiting room.   3.   Do not eat or drink after midnight prior to your procedure.   4.   Do NOT stop your Xarelto for this procedure -- you will continue taking this medication the day before procedure.  Do NOT take any medications the morning of your  procedure.   5.  Plan for an overnight stay.  If you use your phone frequently bring your phone charger.   6. You will follow up with the AFIB clinic 4 weeks after your procedure.  You will follow up with Dr. Curt Bears  3 months after your procedure.  These appointments will be made for you.   * If you have ANY questions please call the office [phone] and ask for Kynzie Polgar RN or send me a MyChart message   * Occasionally, EP Studies and ablations can become lengthy.  Please make your family aware of this before your procedure starts.  Average time ranges from 2-8 hours for EP studies/ablations.  Your physician will call your family after the procedure with the results.  Cardiac Ablation Cardiac ablation is a procedure to disable (ablate) a small amount of heart tissue in very specific places. The heart has many electrical connections. Sometimes these connections are abnormal and can cause the heart to beat very fast or irregularly. Ablating some of the problem areas can improve the heart rhythm or return it to normal. Ablation may be done for people who:  Have Wolff-Parkinson-White syndrome.  Have fast heart rhythms (tachycardia).  Have taken medicines for an abnormal heart rhythm (arrhythmia) that were not effective or caused side effects.  Have a high-risk heartbeat that may be life-threatening. During the procedure, a small incision is made in the neck or the groin, and a long, thin, flexible tube (catheter) is inserted into the incision and moved to the heart. Small devices (electrodes) on the tip of the catheter will send out electrical currents. A type of X-ray (fluoroscopy) will be used to help guide the catheter and to provide images of the heart. Tell a health care provider about:  Any allergies you have.  All medicines you are taking, including vitamins, herbs, eye drops, creams, and over-the-counter medicines.  Any problems you or family  members have had with anesthetic medicines.  Any blood disorders you have.  Any surgeries you have had.  Any medical conditions you have, such as kidney failure.  Whether you are pregnant or may be pregnant. What are the risks? Generally, this is a safe procedure. However, problems may occur, including:  Infection.  Bruising and bleeding at the catheter insertion site.  Bleeding into the chest, especially into the sac that surrounds the heart. This is a serious complication.  Stroke or blood clots.  Damage to other structures or organs.  Allergic reaction to medicines or dyes.  Need for a permanent pacemaker if the normal electrical system is damaged. A pacemaker is a small computer that sends electrical signals to the heart and helps your heart beat normally.  The procedure not being fully effective. This may not be recognized until months later. Repeat ablation procedures are sometimes required. What happens before the procedure?  Follow instructions from your health care provider about eating or drinking restrictions.  Ask your health care provider about: ? Changing or stopping your regular medicines. This is especially important if you are taking diabetes medicines or blood thinners. ? Taking medicines such as aspirin and ibuprofen. These medicines can thin your blood. Do not take these medicines before your procedure if your health care provider instructs you not to.  Plan to have someone take you home from the hospital or clinic.  If you will be going home right after the procedure, plan to have someone with you for 24 hours. What happens during the procedure?  To lower your risk of infection: ? Your health care team will wash or sanitize their hands. ? Your skin will be washed with soap. ? Hair may be removed from the incision area.  An IV tube will be inserted into one of your veins.  You will be given a medicine to help you relax (sedative).  The skin on your  neck or groin will be numbed.  An incision will be made in your neck or your groin.  A needle will be inserted through the incision and into a large vein in your neck or groin.  A catheter will be inserted into the needle and moved to your heart.  Dye may be injected through the catheter to help your surgeon see the area of the  heart that needs treatment.  Electrical currents will be sent from the catheter to ablate heart tissue in desired areas. There are three types of energy that may be used to ablate heart tissue: ? Heat (radiofrequency energy). ? Laser energy. ? Extreme cold (cryoablation).  When the necessary tissue has been ablated, the catheter will be removed.  Pressure will be held on the catheter insertion area to prevent excessive bleeding.  A bandage (dressing) will be placed over the catheter insertion area. The procedure may vary among health care providers and hospitals. What happens after the procedure?  Your blood pressure, heart rate, breathing rate, and blood oxygen level will be monitored until the medicines you were given have worn off.  Your catheter insertion area will be monitored for bleeding. You will need to lie still for a few hours to ensure that you do not bleed from the catheter insertion area.  Do not drive for 24 hours or as long as directed by your health care provider. Summary  Cardiac ablation is a procedure to disable (ablate) a small amount of heart tissue in very specific places. Ablating some of the problem areas can improve the heart rhythm or return it to normal.  During the procedure, electrical currents will be sent from the catheter to ablate heart tissue in desired areas. This information is not intended to replace advice given to you by your health care provider. Make sure you discuss any questions you have with your health care provider. Document Released: 07/15/2008 Document Revised: 08/19/2017 Document Reviewed: 01/16/2016 Elsevier  Patient Education  2020 [employer].

## 2018-12-04 ENCOUNTER — Encounter (INDEPENDENT_AMBULATORY_CARE_PROVIDER_SITE_OTHER): Payer: Medicare Other | Admitting: Ophthalmology

## 2018-12-04 ENCOUNTER — Other Ambulatory Visit: Payer: Self-pay

## 2018-12-04 DIAGNOSIS — D3131 Benign neoplasm of right choroid: Secondary | ICD-10-CM

## 2018-12-04 DIAGNOSIS — H35033 Hypertensive retinopathy, bilateral: Secondary | ICD-10-CM | POA: Diagnosis not present

## 2018-12-04 DIAGNOSIS — H353122 Nonexudative age-related macular degeneration, left eye, intermediate dry stage: Secondary | ICD-10-CM

## 2018-12-04 DIAGNOSIS — H43813 Vitreous degeneration, bilateral: Secondary | ICD-10-CM

## 2018-12-04 DIAGNOSIS — H353211 Exudative age-related macular degeneration, right eye, with active choroidal neovascularization: Secondary | ICD-10-CM | POA: Diagnosis not present

## 2018-12-04 DIAGNOSIS — I10 Essential (primary) hypertension: Secondary | ICD-10-CM | POA: Diagnosis not present

## 2018-12-25 ENCOUNTER — Other Ambulatory Visit: Payer: Self-pay | Admitting: Internal Medicine

## 2019-01-02 ENCOUNTER — Ambulatory Visit (HOSPITAL_COMMUNITY)
Admission: RE | Admit: 2019-01-02 | Discharge: 2019-01-02 | Disposition: A | Discharge status: Home / Self Care | Payer: Medicare Other | Source: Ambulatory Visit | Attending: Internal Medicine | Admitting: Internal Medicine

## 2019-01-02 ENCOUNTER — Other Ambulatory Visit: Payer: Self-pay

## 2019-01-02 ENCOUNTER — Encounter (HOSPITAL_COMMUNITY): Payer: Self-pay

## 2019-01-02 ENCOUNTER — Inpatient Hospital Stay: Payer: Medicare Other | Attending: Internal Medicine

## 2019-01-02 DIAGNOSIS — C3490 Malignant neoplasm of unspecified part of unspecified bronchus or lung: Secondary | ICD-10-CM

## 2019-01-02 DIAGNOSIS — C3431 Malignant neoplasm of lower lobe, right bronchus or lung: Secondary | ICD-10-CM | POA: Insufficient documentation

## 2019-01-02 LAB — CMP (CANCER CENTER ONLY)
ALT: 29 U/L (ref 0–44)
AST: 26 U/L (ref 15–41)
Albumin: 3.5 g/dL (ref 3.5–5.0)
Alkaline Phosphatase: 131 U/L — ABNORMAL HIGH (ref 38–126)
Anion gap: 9 (ref 5–15)
BUN: 14 mg/dL (ref 8–23)
CO2: 28 mmol/L (ref 22–32)
Calcium: 8.8 mg/dL — ABNORMAL LOW (ref 8.9–10.3)
Chloride: 104 mmol/L (ref 98–111)
Creatinine: 0.82 mg/dL (ref 0.61–1.24)
GFR, Est AFR Am: >60 mL/min (ref >60)
GFR, Estimated: >60 mL/min (ref >60)
Glucose, Bld: 116 mg/dL — ABNORMAL HIGH (ref 70–99)
Potassium: 4.1 mmol/L (ref 3.5–5.1)
Sodium: 141 mmol/L (ref 135–145)
Total Bilirubin: 0.7 mg/dL (ref 0.3–1.2)
Total Protein: 6.4 g/dL — ABNORMAL LOW (ref 6.5–8.1)

## 2019-01-02 LAB — CBC WITH DIFFERENTIAL (CANCER CENTER ONLY)
Abs Immature Granulocytes: 0.01 10*3/uL (ref 0.00–0.07)
Basophils Absolute: 0.1 10*3/uL (ref 0.0–0.1)
Basophils Relative: 1 %
Eosinophils Absolute: 0.7 10*3/uL — ABNORMAL HIGH (ref 0.0–0.5)
Eosinophils Relative: 10 %
HCT: 40.2 % (ref 39.0–52.0)
Hemoglobin: 13.3 g/dL (ref 13.0–17.0)
Immature Granulocytes: 0 %
Lymphocytes Relative: 17 %
Lymphs Abs: 1.2 10*3/uL (ref 0.7–4.0)
MCH: 32.3 pg (ref 26.0–34.0)
MCHC: 33.1 g/dL (ref 30.0–36.0)
MCV: 97.6 fL (ref 80.0–100.0)
Monocytes Absolute: 0.6 10*3/uL (ref 0.1–1.0)
Monocytes Relative: 9 %
Neutro Abs: 4.6 10*3/uL (ref 1.7–7.7)
Neutrophils Relative %: 63 %
Platelet Count: 189 10*3/uL (ref 150–400)
RBC: 4.12 MIL/uL — ABNORMAL LOW (ref 4.22–5.81)
RDW: 12.9 % (ref 11.5–15.5)
WBC Count: 7.3 10*3/uL (ref 4.0–10.5)
nRBC: 0 % (ref 0.0–0.2)

## 2019-01-02 MED ORDER — SODIUM CHLORIDE (PF) 0.9 % IJ SOLN
INTRAMUSCULAR | Status: AC
  Filled 2019-01-02: qty 50

## 2019-01-02 MED ORDER — IOHEXOL 300 MG/ML  SOLN
75.0000 mL | Freq: Once | INTRAMUSCULAR | Status: AC | PRN
  Administered 2019-01-02: 75 mL via INTRAVENOUS

## 2019-01-06 ENCOUNTER — Other Ambulatory Visit: Payer: Self-pay

## 2019-01-06 ENCOUNTER — Inpatient Hospital Stay (HOSPITAL_BASED_OUTPATIENT_CLINIC_OR_DEPARTMENT_OTHER): Payer: Medicare Other | Admitting: Internal Medicine

## 2019-01-06 ENCOUNTER — Encounter: Payer: Self-pay | Admitting: Internal Medicine

## 2019-01-06 ENCOUNTER — Telehealth: Payer: Self-pay | Admitting: Internal Medicine

## 2019-01-06 ENCOUNTER — Telehealth (INDEPENDENT_AMBULATORY_CARE_PROVIDER_SITE_OTHER): Payer: Medicare Other | Admitting: Cardiology

## 2019-01-06 VITALS — BP 110/74 | HR 80 | Temp 98.0°F | Resp 20 | Ht 70.0 in | Wt 212.6 lb

## 2019-01-06 DIAGNOSIS — C3491 Malignant neoplasm of unspecified part of right bronchus or lung: Secondary | ICD-10-CM

## 2019-01-06 DIAGNOSIS — I2510 Atherosclerotic heart disease of native coronary artery without angina pectoris: Secondary | ICD-10-CM | POA: Diagnosis not present

## 2019-01-06 DIAGNOSIS — I4819 Other persistent atrial fibrillation: Secondary | ICD-10-CM

## 2019-01-06 DIAGNOSIS — I10 Essential (primary) hypertension: Secondary | ICD-10-CM

## 2019-01-06 DIAGNOSIS — C3431 Malignant neoplasm of lower lobe, right bronchus or lung: Secondary | ICD-10-CM | POA: Diagnosis not present

## 2019-01-06 DIAGNOSIS — C3490 Malignant neoplasm of unspecified part of unspecified bronchus or lung: Secondary | ICD-10-CM | POA: Diagnosis not present

## 2019-01-06 NOTE — Telephone Encounter (Signed)
Scheduled appt per 10/27 los.  Sent a staff message to get a calendar mailed out with central radiology number attached with it.

## 2019-01-06 NOTE — Progress Notes (Signed)
Electrophysiology TeleHealth Note   Due to national recommendations of social distancing due to COVID 19, an audio/video telehealth visit is felt to be most appropriate for this patient at this time.  See Epic message for the patient's consent to telehealth for [facility].   Date:  01/06/2019   ID:  George Vega, DOB 23-Feb-1941, MRN [id number]  Location: patient's home  Provider location: [address], [place]  Evaluation Performed: Follow-up visit  PCP:  [name], MD  Cardiologist:  Cristopher Peru, MD  Electrophysiologist:  Dr Curt Bears  Chief Complaint: Atrial fibrillation  History of Present Illness:    George Vega is a 78 y.o. male who presents via audio/video conferencing for a telehealth visit today.  Since last being seen in our clinic, the patient reports doing very well.  Today, he denies symptoms of palpitations, chest pain, shortness of breath,  lower extremity edema, dizziness, presyncope, or syncope.  The patient is otherwise without complaint today.  The patient denies symptoms of fevers, chills, cough, or new SOB worrisome for COVID 19.  He has a history of paroxysmal atrial fibrillation, hyperlipidemia, and diastolic heart failure.  He has been having more atrial fibrillation over the past few months.  He is currently on dofetilide and Xarelto.  He has scheduled A. fib ablation 01/16/2019.  Today, denies symptoms of palpitations, chest pain, shortness of breath, orthopnea, PND, lower extremity edema, claudication, dizziness, presyncope, syncope, bleeding, or neurologic sequela. The patient is tolerating medications without difficulties.  He needs to have short episodes of atrial fibrillation, though they are improved with medication management.  Past Medical History:  Diagnosis Date   Arthritis    Atrial fibrillation, persistent ([place])    Chronic sinusitis    Coronary artery disease    Depression    Difficult intubation    was  told with shoulder done 2006-alittle narrow   Dysrhythmia    A. Fib   Gait disorder 05/28/2014   Hypertension    Jejunostomy tube fell out    when asked about this in 04/2017, pt denied ever having a J tube, feeding tube, tubes placed post surgery so ??? veracity of a previous J tube.     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   Spastic colon    Squamous cell carcinoma of lung, stage I, right ([place]) 05/07/2018   bx 04/29/18; isolated PET uptake in RLL mass   Stroke (cerebrum) ([facility])    SVT (supraventricular tachycardia) ([place])     Past Surgical History:  Procedure Laterality Date   CARDIAC CATHETERIZATION     CATARACT EXTRACTION, BILATERAL     COLONOSCOPY WITH PROPOFOL N/A 04/17/2017   Procedure: COLONOSCOPY WITH PROPOFOL;  Surgeon: Yetta Flock, MD;  Location: [place];  Service: Gastroenterology;  Laterality: N/A;   ESOPHAGEAL DILATION  2017   ESOPHAGOGASTRODUODENOSCOPY (EGD) WITH PROPOFOL N/A 04/17/2017   Procedure: ESOPHAGOGASTRODUODENOSCOPY (EGD) WITH PROPOFOL;  Surgeon: Yetta Flock, MD;  Location: [place];  Service: Gastroenterology;  Laterality: N/A;   EYE SURGERY     FOOT NEUROMA SURGERY  2002   LEFT HEART CATHETERIZATION WITH CORONARY ANGIOGRAM Right 06/14/2011   20% LM, chronic occluded mid LAD, 50% ostial LCX, 20% mid RI, RCA with collaterals to mid LAD, mid 10% stenosis, EF 60% 06/14/11   NASAL SINUS SURGERY     SHOULDER ARTHROSCOPY  03/01/2011   Procedure: ARTHROSCOPY SHOULDER;  Surgeon: Ninetta Lights, MD;  Location: [facility];  Service: Orthopedics;  Laterality: Right;  arthroscopy shoulder decompression subacromial partial acromioplasty with coracoacromial release, distal claviculectomy, debridement of labrium   SHOULDER ARTHROSCOPY  03/01/2011   Procedure: ARTHROSCOPY SHOULDER;  Surgeon: Ninetta Lights, MD;  Location: [place];  Service: Orthopedics;  Laterality: Right;   arthroscopy shoulder decompression subacromial partial acromioplasty with coracoacromial release, distal claviculectomy, debridement of labrium   TEE WITHOUT CARDIOVERSION N/A 03/19/2016   Procedure: TRANSESOPHAGEAL ECHOCARDIOGRAM (TEE);  Surgeon: Larey Dresser, MD;  Location: [place];  Service: Cardiovascular;  Laterality: N/A;   TOTAL KNEE ARTHROPLASTY Right 09/23/2013   Procedure: TOTAL KNEE ARTHROPLASTY;  Surgeon: Ninetta Lights, MD;  Location: [place];  Service: Orthopedics;  Laterality: Right;   TOTAL SHOULDER ARTHROPLASTY  06/28/2011   Procedure: TOTAL SHOULDER ARTHROPLASTY;  Surgeon: Ninetta Lights, MD;  Location: [place];  Service: Orthopedics;  Laterality: Right;   UVULOPALATOPHARYNGOPLASTY     VIDEO ASSISTED THORACOSCOPY (VATS)/WEDGE RESECTION Right 06/02/2018   Procedure: VIDEO ASSISTED THORACOSCOPY (VATS)/WEDGE RESECTION with Lymph node disection and intercostal nerve block.;  Surgeon: [name], MD;  Location: [place];  Service: Thoracic;  Laterality: Right;    Current Outpatient Medications  Medication Sig Dispense Refill   acetaminophen (TYLENOL) 325 MG tablet Take 650 mg by mouth every 6 (six) hours as needed for moderate pain.     Cholecalciferol (VITAMIN D3) 5000 units TABS Take 5,000 Units by mouth daily.     Cyanocobalamin (VITAMIN B-12) 500 MCG SUBL Place 500 mcg under the tongue 3 (three) times a week.      dofetilide (TIKOSYN) 250 MCG capsule TAKE 1 CAPSULE BY MOUTH TWO TIMES DAILY 180 capsule 2   ferrous sulfate (FEROSUL) 325 (65 FE) MG tablet Take 1 tablet (325 mg total) by mouth 2 (two) times daily with a meal.     furosemide (LASIX) 40 MG tablet TAKE 1 TABLET(40 MG) BY MOUTH DAILY 90 tablet 2   irbesartan (AVAPRO) 150 MG tablet TAKE 1 TABLET(150 MG) BY MOUTH DAILY 90 tablet 3   magnesium oxide (MAG-OX) 400 MG tablet Take 400 mg by mouth daily.     Melatonin 10 MG TABS Take 10 mg by mouth at bedtime as needed (sleep).       metoprolol tartrate (LOPRESSOR) 25 MG tablet TAKE 1 TABLET BY MOUTH 2  TIMES DAILY. 180 tablet 2   metoprolol tartrate (LOPRESSOR) 25 MG tablet Take 1-2 tablets (25-50 mg total) by mouth daily as needed (for breakthrough palpitations). 180 tablet 3   Multiple Vitamins-Minerals (PRESERVISION AREDS 2 PO) Take 1 capsule by mouth 2 (two) times daily.     pantoprazole (PROTONIX) 40 MG tablet Take 40 mg by mouth daily.      PARoxetine (PAXIL) 30 MG tablet Take 30 mg by mouth at bedtime.      polycarbophil (FIBERCON) 625 MG tablet Take 1,250 mg by mouth at bedtime.      Polyethyl Glycol-Propyl Glycol (SYSTANE OP) Place 2 drops into both eyes 2 (two) times daily as needed (dry eyes).      potassium chloride (K-DUR) 10 MEQ tablet TAKE 1 TABLET BY MOUTH EVERY DAY FOR 3 DAYS. THEN TAKE ONLY WHEN TAKING LASIX AS DIRECTED. 30 tablet 3   rosuvastatin (CRESTOR) 5 MG tablet TAKE 1 TABLET BY MOUTH  EVERY MORNING 90 tablet 3   tamsulosin (FLOMAX) 0.4 MG CAPS capsule Take 0.4 mg by mouth daily.  11   XARELTO 15 MG TABS tablet TAKE 1  TABLET BY MOUTH  DAILY WITH SUPPER 90 tablet 1   No current facility-administered medications for this visit.     Allergies:   Glucosamine-chondroitin, Tetanus toxoid, Fish oil, Ibuprofen, and Naproxen   Social History:  The patient  reports that he quit smoking about 20 years ago. His smoking use included cigarettes. He has a 60.00 pack-year smoking history. He has never used smokeless tobacco. He reports current alcohol use of about 14.0 standard drinks of alcohol per week. He reports that he does not use drugs.   Family History:  The patient's  family history includes Cancer in his mother; Heart attack in his father; Migraines in his brother.   ROS:  Please see the history of present illness.   All other systems are personally reviewed and negative.    Exam:    Vital Signs:  BP 96/74    Pulse 75  no acute distress, no shortness of breath.  Labs/Other Tests and Data  Reviewed:    Recent Labs: 06/06/2018: Magnesium 1.9 01/02/2019: ALT 29; BUN 14; Creatinine 0.82; Hemoglobin 13.3; Platelet Count 189; Potassium 4.1; Sodium 141   Wt Readings from Last 3 Encounters:  01/06/19 212 lb 9.6 oz (96.4 kg)  12/01/18 209 lb 9.6 oz (95.1 kg)  10/30/18 209 lb (94.8 kg)     Other studies personally reviewed: Aditional studies/ records that were reviewed today include: ECG 12/01/2018 personally reviewed Review of the above records today demonstrates: Sinus rhythm   ASSESSMENT & PLAN:    1.  Paroxysmal atrial fibrillation: Has had more atrial fibrillation over the last few months on Xarelto and dofetilide.  We Sharisse Rantz plan for ablation.  Risks and benefits were discussed which include bleeding, tamponade, heart block, stroke, damage surrounding organs.  He understands these risks and is agreed to the procedure.  CHA2DS2-VASc of 6.  2.  Diastolic heart failure: No signs of volume overload.  No changes.  3.  Hyperlipidemia: Continue statin  4.  GI bleed: Currently on Xarelto.  No recent bleeding.   COVID 19 screen The patient denies symptoms of COVID 19 at this time.  The importance of social distancing was discussed today.  Follow-up: 3 months  Current medicines are reviewed at length with the patient today.   The patient does not have concerns regarding his medicines.  The following changes were made today:  none  Labs/ tests ordered today include:  No orders of the defined types were placed in this encounter.    Patient Risk:  after full review of this patients clinical status, I feel that they are at moderate risk at this time.  Today, I have spent 8 minutes with the patient with telehealth technology discussing atrial fibrillation.    Signed, Emilene Roma Meredith Leeds, MD  01/06/2019 4:46 PM     [address] [place] [address] [phone] (office) [phone] (fax)

## 2019-01-06 NOTE — Progress Notes (Signed)
[name] [phone]   [phone]  OFFICE PROGRESS NOTE  George Lass, MD [place] [address]  DIAGNOSIS: Stage IB (T2a, N0, M0) non-small cell lung cancer, squamous cell carcinoma presented with right lower lobe lung nodule diagnosed in February 2020.  PRIOR THERAPY: Right VATS with superior segmentectomy on the right lower lobe with lymph node dissection under the care of Dr. Servando Snare on June 02, 2018.  CURRENT THERAPY: Observation.  INTERVAL HISTORY: George Vega 78 y.o. male returns to the clinic today for follow-up visit.  The patient is feeling fine today with no concerning complaints except for shortness of breath with exertion.  He has atrial fibrillation and scheduled to see his cardiologist next week for reevaluation.  He denied having any chest pain, cough or hemoptysis.  He denied having any fever or chills.  He has no nausea, vomiting, diarrhea or constipation.  He has no headache or visual changes.  He is here today for evaluation with repeat CT scan of the chest for restaging of his disease.   MEDICAL HISTORY: Past Medical History:  Diagnosis Date   Arthritis    Atrial fibrillation, persistent ([place])    Chronic sinusitis    Coronary artery disease    Depression    Difficult intubation    was told with shoulder done 2006-alittle narrow   Dysrhythmia    A. Fib   Gait disorder 05/28/2014   Hypertension    Jejunostomy tube fell out    when asked about this in 04/2017, pt denied ever having a J tube, feeding tube, tubes placed post surgery so ??? veracity of a previous J tube.     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  MEDICATIONS:  Current Outpatient Medications  Medication Sig Dispense Refill   acetaminophen (TYLENOL) 325 MG tablet Take 650 mg by mouth every 6 (six) hours as needed for moderate pain.     Cholecalciferol (VITAMIN D3) 5000 units TABS Take 5,000 Units by mouth daily.     Cyanocobalamin (VITAMIN B-12) 500 MCG SUBL Place 500 mcg under the tongue 3 (three) times a week.      dofetilide (TIKOSYN) 250 MCG capsule TAKE 1 CAPSULE BY MOUTH TWO TIMES DAILY 180 capsule 2   ferrous sulfate (FEROSUL) 325 (65 FE) MG tablet Take 1 tablet (325 mg total) by mouth 2 (two) times daily with a meal.     furosemide (LASIX) 40 MG tablet TAKE 1 TABLET(40 MG) BY MOUTH DAILY 90 tablet 2   irbesartan (AVAPRO) 150 MG tablet TAKE 1 TABLET(150 MG) BY MOUTH DAILY 90 tablet 3   magnesium oxide (MAG-OX) 400 MG tablet Take 400 mg by mouth daily.     Melatonin 10 MG TABS Take 10 mg by mouth at bedtime as needed (sleep).      metoprolol tartrate (LOPRESSOR) 25 MG tablet TAKE 1 TABLET BY MOUTH 2  TIMES DAILY. 180 tablet 2   metoprolol tartrate (LOPRESSOR) 25 MG tablet Take 1-2 tablets (25-50 mg total) by mouth daily as needed (for breakthrough palpitations). 180 tablet 3   Multiple Vitamins-Minerals (PRESERVISION AREDS  2 PO) Take 1 capsule by mouth 2 (two) times daily.     pantoprazole (PROTONIX) 40 MG tablet Take 40 mg by mouth daily.      PARoxetine (PAXIL) 30 MG tablet Take 30 mg by mouth at bedtime.      polycarbophil (FIBERCON) 625 MG tablet Take 1,250 mg by mouth at bedtime.      Polyethyl Glycol-Propyl Glycol (SYSTANE OP) Place 2 drops into both eyes 2 (two) times daily as needed (dry eyes).      potassium chloride (K-DUR) 10 MEQ tablet TAKE 1 TABLET BY MOUTH EVERY DAY FOR 3 DAYS. THEN TAKE ONLY WHEN TAKING LASIX AS DIRECTED. 30 tablet 3   rosuvastatin (CRESTOR) 5 MG tablet TAKE 1 TABLET BY MOUTH  EVERY MORNING 90 tablet 3    tamsulosin (FLOMAX) 0.4 MG CAPS capsule Take 0.4 mg by mouth daily.  11   XARELTO 15 MG TABS tablet TAKE 1 TABLET BY MOUTH  DAILY WITH SUPPER 90 tablet 1   No current facility-administered medications for this visit.     SURGICAL HISTORY:  Past Surgical History:  Procedure Laterality Date   CARDIAC CATHETERIZATION     CATARACT EXTRACTION, BILATERAL     COLONOSCOPY WITH PROPOFOL N/A 04/17/2017   Procedure: COLONOSCOPY WITH PROPOFOL;  Surgeon: Yetta Flock, MD;  Location: [place];  Service: Gastroenterology;  Laterality: N/A;   ESOPHAGEAL DILATION  2017   ESOPHAGOGASTRODUODENOSCOPY (EGD) WITH PROPOFOL N/A 04/17/2017   Procedure: ESOPHAGOGASTRODUODENOSCOPY (EGD) WITH PROPOFOL;  Surgeon: Yetta Flock, MD;  Location: [facility];  Service: Gastroenterology;  Laterality: N/A;   EYE SURGERY     FOOT NEUROMA SURGERY  2002   LEFT HEART CATHETERIZATION WITH CORONARY ANGIOGRAM Right 06/14/2011   20% LM, chronic occluded mid LAD, 50% ostial LCX, 20% mid RI, RCA with collaterals to mid LAD, mid 10% stenosis, EF 60% 06/14/11   NASAL SINUS SURGERY     SHOULDER ARTHROSCOPY  03/01/2011   Procedure: ARTHROSCOPY SHOULDER;  Surgeon: Ninetta Lights, MD;  Location: [facility];  Service: Orthopedics;  Laterality: Right;  arthroscopy shoulder decompression subacromial partial acromioplasty with coracoacromial release, distal claviculectomy, debridement of labrium   SHOULDER ARTHROSCOPY  03/01/2011   Procedure: ARTHROSCOPY SHOULDER;  Surgeon: Ninetta Lights, MD;  Location: [place] [facility];  Service: Orthopedics;  Laterality: Right;  arthroscopy shoulder decompression subacromial partial acromioplasty with coracoacromial release, distal claviculectomy, debridement of labrium   TEE WITHOUT CARDIOVERSION N/A 03/19/2016   Procedure: TRANSESOPHAGEAL ECHOCARDIOGRAM (TEE);  Surgeon: Larey Dresser, MD;  Location: [facility];  Service: Cardiovascular;  Laterality:  N/A;   TOTAL KNEE ARTHROPLASTY Right 09/23/2013   Procedure: TOTAL KNEE ARTHROPLASTY;  Surgeon: Ninetta Lights, MD;  Location: [facility];  Service: Orthopedics;  Laterality: Right;   TOTAL SHOULDER ARTHROPLASTY  06/28/2011   Procedure: TOTAL SHOULDER ARTHROPLASTY;  Surgeon: Ninetta Lights, MD;  Location: [facility] [facility];  Service: Orthopedics;  Laterality: Right;   UVULOPALATOPHARYNGOPLASTY     VIDEO ASSISTED THORACOSCOPY (VATS)/WEDGE RESECTION Right 06/02/2018   Procedure: VIDEO ASSISTED THORACOSCOPY (VATS)/WEDGE RESECTION with Lymph node disection and intercostal nerve block.;  Surgeon: [name], MD;  [facility];  Service: Thoracic;  Laterality: Right;    REVIEW OF SYSTEMS:  A comprehensive review of systems was negative except for: Respiratory: positive for dyspnea on exertion   PHYSICAL EXAMINATION: General appearance: alert, cooperative and no distress Head: Normocephalic, without obvious abnormality, atraumatic Neck: no adenopathy, no JVD, supple, symmetrical, trachea midline  and thyroid not enlarged, symmetric, no tenderness/mass/nodules Lymph nodes: Cervical, supraclavicular, and axillary nodes normal. Resp: clear to auscultation bilaterally Back: symmetric, no curvature. ROM normal. No CVA tenderness. Cardio: regular rate and rhythm, S1, S2 normal, no murmur, click, rub or gallop GI: soft, non-tender; bowel sounds normal; no masses,  no organomegaly Extremities: extremities normal, atraumatic, no cyanosis or edema  ECOG PERFORMANCE STATUS: 1 - Symptomatic but completely ambulatory  Blood pressure 110/74, pulse 80, temperature 98 F (36.7 C), temperature source Temporal, resp. rate 20, height 5\' 10"  (1.778 m), weight 212 lb 9.6 oz (96.4 kg), SpO2 95 %.  LABORATORY DATA: Lab Results  Component Value Date   WBC 7.3 01/02/2019   HGB 13.3 01/02/2019   HCT 40.2 01/02/2019   MCV 97.6 01/02/2019   PLT 189 01/02/2019      Chemistry      Component Value  Date/Time   NA 141 01/02/2019 1132   NA 140 05/08/2017 0828   K 4.1 01/02/2019 1132   CL 104 01/02/2019 1132   CO2 28 01/02/2019 1132   BUN 14 01/02/2019 1132   BUN 17 05/08/2017 0828   CREATININE 0.82 01/02/2019 1132      Component Value Date/Time   CALCIUM 8.8 (L) 01/02/2019 1132   ALKPHOS 131 (H) 01/02/2019 1132   AST 26 01/02/2019 1132   ALT 29 01/02/2019 1132   BILITOT 0.7 01/02/2019 1132       RADIOGRAPHIC STUDIES: Ct Chest W Contrast  Result Date: 01/02/2019 CLINICAL DATA:  Follow-up lung cancer. EXAM: CT CHEST WITH CONTRAST TECHNIQUE: Multidetector CT imaging of the chest was performed during intravenous contrast administration. CONTRAST:  94mL OMNIPAQUE IOHEXOL 300 MG/ML  SOLN COMPARISON:  04/18/2018 FINDINGS: Cardiovascular: Mild cardiac enlargement. Aortic atherosclerosis. Left main and 3 vessel coronary artery atherosclerotic calcifications. Mediastinum/Nodes: Normal appearance of the thyroid gland. The trachea appears patent and is midline. No enlarged axillary, supraclavicular, mediastinal or hilar lymph nodes. Lungs/Pleura: Chronic right posterior pleural thickening identified. Centrilobular and paraseptal emphysema. There is been interval wedge resection of the posterior, subpleural nodule in the right lower lobe. Mild, nonspecific soft tissue thickening along the suture line is identified, image 45/6. Sub solid nodule within the periphery of the left upper lobe is not identified on today's exam. Presumably postinflammatory/infectious etiology. Small pulmonary nodule within the left lower lobe measures 0.3 cm, image number 10/7. Unchanged. Calcified granuloma noted in the left lower lobe. Upper Abdomen: No acute abnormality. 1.8 cm stone identified within the gallbladder fundus. Musculoskeletal: No chest wall abnormality. No acute or significant osseous findings. IMPRESSION: 1. Status post wedge resection of right lower lobe lung nodule. There is mild, nonspecific soft tissue  thickening along the suture line which may reflect postoperative change. Attention on follow-up imaging is advised. 2. Left main and 3 vessel coronary artery atherosclerotic calcifications noted. 3. Gallstones. Aortic Atherosclerosis (ICD10-I70.0) and Emphysema (ICD10-J43.9). Electronically Signed   By: Kerby Moors M.D.   On: 01/02/2019 13:24    ASSESSMENT AND PLAN: This is a very pleasant 78 years old white male with a stage Ib non-small cell lung cancer, squamous cell carcinoma status post superior segmentectomy of the right lower lobe with lymph node dissection in March 2020 under the care of Dr. Servando Snare. The tumor size was 2.7 cm with visceropleural involvement. The patient is currently on observation and he is feeling fine with no concerning complaints. He had repeat CT scan of the chest performed recently.  I personally and independently reviewed the scans and discussed  the results with the patient today. He has no concerning findings for disease progression. I recommended for the patient to continue on observation with repeat CT scan of the chest in 6 months. The patient was advised to call immediately if he has any concerning symptoms in the interval. The patient voices understanding of current disease status and treatment options and is in agreement with the current care plan.  All questions were answered. The patient knows to call the clinic with any problems, questions or concerns. We can certainly see the patient much sooner if necessary.  Disclaimer: This note was dictated with voice recognition software. Similar sounding words can inadvertently be transcribed and may not be corrected upon review.

## 2019-01-08 ENCOUNTER — Telehealth (HOSPITAL_COMMUNITY): Payer: Self-pay | Admitting: Emergency Medicine

## 2019-01-08 NOTE — Telephone Encounter (Signed)
Reaching out to patient to offer assistance regarding upcoming cardiac imaging study; pt verbalizes understanding of appt date/time, parking situation and where to check in, pre-test NPO status and medications ordered, and verified current allergies; name and call back number provided for further questions should they arise Owen Pratte RN Navigator Cardiac Imaging [facility] [phone] office [phone] cell 

## 2019-01-09 ENCOUNTER — Ambulatory Visit (HOSPITAL_COMMUNITY)
Admission: RE | Admit: 2019-01-09 | Discharge: 2019-01-09 | Disposition: A | Discharge status: Home / Self Care | Payer: Medicare Other | Source: Ambulatory Visit | Attending: Cardiology | Admitting: Cardiology

## 2019-01-09 ENCOUNTER — Other Ambulatory Visit: Payer: Self-pay

## 2019-01-09 ENCOUNTER — Other Ambulatory Visit: Payer: Medicare Other

## 2019-01-09 DIAGNOSIS — I4819 Other persistent atrial fibrillation: Secondary | ICD-10-CM | POA: Diagnosis present

## 2019-01-09 MED ORDER — IOHEXOL 350 MG/ML SOLN
80.0000 mL | Freq: Once | INTRAVENOUS | Status: AC | PRN
  Administered 2019-01-09: 80 mL via INTRAVENOUS

## 2019-01-12 ENCOUNTER — Telehealth: Payer: Self-pay | Admitting: *Deleted

## 2019-01-12 NOTE — Telephone Encounter (Signed)
Cancelled AFib ablation scheduled for this Friday d/t abn CT findings, per Dr. Curt Bears. Pt made aware and virtual visit scheduled, for tomorrow, to discuss further w/ APP.   Cath spot scheduled/held for Friday at 9:00 w/ Irish Lack.  Pt aware that they will give him instructions tomorrow if visit w/ Phylliss Bob deems that it is ok to proceed w/ procedure Friday.  (pt is already scheduled for COVID screening tomorrow arranged from original ablation)  Aware we will follow up at a later date to reschedule ablation Patient verbalized understanding and agreeable to plan.

## 2019-01-12 NOTE — Telephone Encounter (Signed)

## 2019-01-13 ENCOUNTER — Telehealth (INDEPENDENT_AMBULATORY_CARE_PROVIDER_SITE_OTHER): Payer: Medicare Other | Admitting: Cardiology

## 2019-01-13 ENCOUNTER — Encounter: Payer: Self-pay | Admitting: Cardiology

## 2019-01-13 ENCOUNTER — Telehealth: Payer: Self-pay

## 2019-01-13 ENCOUNTER — Other Ambulatory Visit (HOSPITAL_COMMUNITY)
Admission: RE | Admit: 2019-01-13 | Discharge: 2019-01-13 | Disposition: A | Discharge status: Home / Self Care | Payer: Medicare Other | Source: Ambulatory Visit | Attending: Interventional Cardiology | Admitting: Interventional Cardiology

## 2019-01-13 ENCOUNTER — Other Ambulatory Visit: Payer: Self-pay

## 2019-01-13 ENCOUNTER — Ambulatory Visit (INDEPENDENT_AMBULATORY_CARE_PROVIDER_SITE_OTHER): Payer: Medicare Other | Admitting: *Deleted

## 2019-01-13 ENCOUNTER — Other Ambulatory Visit: Payer: Medicare Other

## 2019-01-13 VITALS — BP 125/75 | HR 72 | Ht 70.0 in | Wt 210.0 lb

## 2019-01-13 DIAGNOSIS — Z01812 Encounter for preprocedural laboratory examination: Secondary | ICD-10-CM | POA: Diagnosis present

## 2019-01-13 DIAGNOSIS — Z20828 Contact with and (suspected) exposure to other viral communicable diseases: Secondary | ICD-10-CM | POA: Insufficient documentation

## 2019-01-13 DIAGNOSIS — I480 Paroxysmal atrial fibrillation: Secondary | ICD-10-CM

## 2019-01-13 DIAGNOSIS — I4819 Other persistent atrial fibrillation: Secondary | ICD-10-CM | POA: Diagnosis not present

## 2019-01-13 DIAGNOSIS — I2510 Atherosclerotic heart disease of native coronary artery without angina pectoris: Secondary | ICD-10-CM | POA: Diagnosis not present

## 2019-01-13 DIAGNOSIS — I10 Essential (primary) hypertension: Secondary | ICD-10-CM

## 2019-01-13 LAB — CBC
Hematocrit: 41.8 % (ref 37.5–51.0)
Hemoglobin: 13.9 g/dL (ref 13.0–17.7)
MCH: 32.1 pg (ref 26.6–33.0)
MCHC: 33.3 g/dL (ref 31.5–35.7)
MCV: 97 fL (ref 79–97)
Platelets: 238 10*3/uL (ref 150–450)
RBC: 4.33 x10E6/uL (ref 4.14–5.80)
RDW: 12.1 % (ref 11.6–15.4)
WBC: 9 10*3/uL (ref 3.4–10.8)

## 2019-01-13 LAB — BASIC METABOLIC PANEL
BUN/Creatinine Ratio: 20 (ref 10–24)
BUN: 21 mg/dL (ref 8–27)
CO2: 25 mmol/L (ref 20–29)
Calcium: 9.5 mg/dL (ref 8.6–10.2)
Chloride: 100 mmol/L (ref 96–106)
Creatinine, Ser: 1.06 mg/dL (ref 0.76–1.27)
GFR calc Af Amer: 77 mL/min/{1.73_m2} (ref >59)
GFR calc non Af Amer: 67 mL/min/{1.73_m2} (ref >59)
Glucose: 116 mg/dL — ABNORMAL HIGH (ref 65–99)
Potassium: 4.6 mmol/L (ref 3.5–5.2)
Sodium: 139 mmol/L (ref 134–144)

## 2019-01-13 MED ORDER — APIXABAN 5 MG PO TABS: 5.0000 mg | ORAL_TABLET | Freq: Two times a day (BID) | ORAL | 2 refills | Status: DC

## 2019-01-13 NOTE — H&P (View-Only) (Signed)
Virtual Visit via Telephone Note   This visit type was conducted due to national recommendations for restrictions regarding the COVID-19 Pandemic (e.g. social distancing) in an effort to limit this patient's exposure and mitigate transmission in our community.  Due to his co-morbid illnesses, this patient is at least at moderate risk for complications without adequate follow up.  This format is felt to be most appropriate for this patient at this time.  The patient did not have access to video technology/had technical difficulties with video requiring transitioning to audio format only (telephone).  All issues noted in this document were discussed and addressed.  No physical exam could be performed with this format.  Please refer to the patient's chart for his  consent to telehealth for [facility].   Date:  01/13/2019   ID:  George Vega, DOB 1940/03/17, MRN [id number]  Patient Location: Home Provider Location: Home  PCP:  [name], MD  Cardiologist:  Cristopher Peru, MD  Electrophysiologist:  None   Evaluation Performed:  Follow-Up Visit  Chief Complaint: CAD noted on cardiac CTA  History of Present Illness:    George Vega is a 78 y.o. male with a history of paroxysmal atrial fibrillation, hyperlipidemia and diastolic heart failure.  He is followed by electrophysiology for atrial fibrillation and has been controlled on dofetilide.  He is on Xarelto for stroke risk reduction.  He was seen in the office on 01/06/2019 by Dr. Curt Bears at which time he was noted to have been having more atrial fibrillation over the last several months.  He was planned for an A. fib ablation, however, a cardiac CTA revealed moderate left main disease, extensive plaque in the LAD which appeared subtotally occluded, possible moderate circumflex disease and moderate to possible severe RCA disease.  The patient is being seen today to discuss his CTA results and plan for cardiac catheterization on  Friday.  Mr George Vega reports that he has occ feeling of chest discomfort, not pain, with his episodes of afib. Yesterday he had a bout of afib in the 160s which responed to an extra dose of metoprolol after about 45 minutes. He is also noting DOE and mild chest pressure while going about 100 feet walking to the mailbox. He has no orthopnea, PND or edema. He says that his activity is limited due to knee pain and shortness of breath.   The patient does not have symptoms concerning for COVID-19 infection (fever, chills, cough, or new shortness of breath).    Past Medical History:  Diagnosis Date  . Arthritis   . Atrial fibrillation, persistent ([place])   . Chronic sinusitis   . Coronary artery disease   . Depression   . Difficult intubation    was told with shoulder done 2006-alittle narrow  . Dysrhythmia    A. Fib  . 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 Hypertension   . Jejunostomy tube fell out    when asked about this in 04/2017, pt denied ever having a J tube, feeding tube, tubes placed post surgery so ??? veracity of a previous J tube.    . 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 Spastic colon   . 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 Stroke (cerebrum) ([place])   . SVT (supraventricular tachycardia) ([facility])    Past Surgical History:  Procedure Laterality Date  . CARDIAC CATHETERIZATION    .  CATARACT EXTRACTION, BILATERAL    . COLONOSCOPY WITH PROPOFOL N/A 04/17/2017   Procedure: COLONOSCOPY WITH PROPOFOL;  Surgeon: Yetta Flock, MD;  Location: [place];  Service: Gastroenterology;  Laterality: N/A;  . ESOPHAGEAL DILATION  2017  . ESOPHAGOGASTRODUODENOSCOPY (EGD) WITH PROPOFOL N/A 04/17/2017   Procedure: ESOPHAGOGASTRODUODENOSCOPY (EGD) WITH PROPOFOL;  Surgeon: Yetta Flock, MD;  Location: [place];  Service: Gastroenterology;  Laterality: N/A;  . EYE SURGERY    . FOOT NEUROMA  SURGERY  2002  . LEFT HEART CATHETERIZATION WITH CORONARY ANGIOGRAM Right 06/14/2011   20% LM, chronic occluded mid LAD, 50% ostial LCX, 20% mid RI, RCA with collaterals to mid LAD, mid 10% stenosis, EF 60% 06/14/11  . NASAL SINUS SURGERY    . SHOULDER ARTHROSCOPY  03/01/2011   Procedure: ARTHROSCOPY SHOULDER;  Surgeon: Ninetta Lights, MD;  Location: [place];  Service: Orthopedics;  Laterality: Right;  arthroscopy shoulder decompression subacromial partial acromioplasty with coracoacromial release, distal claviculectomy, debridement of labrium  . SHOULDER ARTHROSCOPY  03/01/2011   Procedure: ARTHROSCOPY SHOULDER;  Surgeon: Ninetta Lights, MD;  Location: [facility];  Service: Orthopedics;  Laterality: Right;  arthroscopy shoulder decompression subacromial partial acromioplasty with coracoacromial release, distal claviculectomy, debridement of labrium  . TEE WITHOUT CARDIOVERSION N/A 03/19/2016   Procedure: TRANSESOPHAGEAL ECHOCARDIOGRAM (TEE);  Surgeon: Larey Dresser, MD;  Location: [facility];  Service: Cardiovascular;  Laterality: N/A;  . TOTAL KNEE ARTHROPLASTY Right 09/23/2013   Procedure: TOTAL KNEE ARTHROPLASTY;  Surgeon: Ninetta Lights, MD;  Location: [facility];  Service: Orthopedics;  Laterality: Right;  . TOTAL SHOULDER ARTHROPLASTY  06/28/2011   Procedure: TOTAL SHOULDER ARTHROPLASTY;  Surgeon: Ninetta Lights, MD;  Location: [facility];  Service: Orthopedics;  Laterality: Right;  . UVULOPALATOPHARYNGOPLASTY    . VIDEO ASSISTED THORACOSCOPY (VATS)/WEDGE RESECTION Right 06/02/2018   Procedure: VIDEO ASSISTED THORACOSCOPY (VATS)/WEDGE RESECTION with Lymph node disection and intercostal nerve block.;  Surgeon: [name], MD;  [facility];  Service: Thoracic;  Laterality: Right;     Current Meds  Medication Sig  . acetaminophen (TYLENOL) 325 MG tablet Take 650 mg by mouth every 6 (six) hours as needed for moderate pain.  .  Cholecalciferol (VITAMIN D3) 5000 units TABS Take 5,000 Units by mouth daily.  . Cyanocobalamin (VITAMIN B-12) 500 MCG SUBL Place 500 mcg under the tongue 3 (three) times a week.   . dofetilide (TIKOSYN) 250 MCG capsule TAKE 1 CAPSULE BY MOUTH TWO TIMES DAILY (Patient taking differently: Take 250 mcg by mouth 2 (two) times daily. )  . ferrous sulfate (FEROSUL) 325 (65 FE) MG tablet Take 1 tablet (325 mg total) by mouth 2 (two) times daily with a meal.  . furosemide (LASIX) 40 MG tablet TAKE 1 TABLET(40 MG) BY MOUTH DAILY (Patient taking differently: Take 40 mg by mouth 4 (four) times a week. )  . irbesartan (AVAPRO) 150 MG tablet TAKE 1 TABLET(150 MG) BY MOUTH DAILY (Patient taking differently: Take 150 mg by mouth daily. TAKE 1 TABLET(150 MG) BY MOUTH DAILY)  . magnesium oxide (MAG-OX) 400 MG tablet Take 400 mg by mouth daily.  . Melatonin 10 MG TABS Take 10 mg by mouth at bedtime as needed (sleep).   . metoprolol tartrate (LOPRESSOR) 25 MG tablet TAKE 1 TABLET BY MOUTH 2  TIMES DAILY.  . metoprolol tartrate (LOPRESSOR) 25 MG tablet Take 1-2 tablets (25-50 mg total) by mouth daily as needed (for breakthrough palpitations).  . Multiple  Vitamins-Minerals (PRESERVISION AREDS 2 PO) Take 1 capsule by mouth 2 (two) times daily.  . pantoprazole (PROTONIX) 40 MG tablet Take 40 mg by mouth daily.   Marland Kitchen PARoxetine (PAXIL) 30 MG tablet Take 30 mg by mouth at bedtime.   . polycarbophil (FIBERCON) 625 MG tablet Take 1,250 mg by mouth at bedtime.   [name] Glycol-Propyl Glycol (SYSTANE OP) Place 2 drops into both eyes 2 (two) times daily as needed (dry eyes).   . potassium chloride (K-DUR) 10 MEQ tablet TAKE 1 TABLET BY MOUTH EVERY DAY FOR 3 DAYS. THEN TAKE ONLY WHEN TAKING LASIX AS DIRECTED. (Patient taking differently: Take 10 mEq by mouth 4 (four) times a week. Takes with Lsix)  . rosuvastatin (CRESTOR) 5 MG tablet TAKE 1 TABLET BY MOUTH  EVERY MORNING  . tamsulosin (FLOMAX) 0.4 MG CAPS capsule Take 0.4 mg  by mouth daily.  . [DISCONTINUED] XARELTO 15 MG TABS tablet TAKE 1 TABLET BY MOUTH  DAILY WITH SUPPER (Patient taking differently: Take 15 mg by mouth daily with supper. )     Allergies:   Glucosamine-chondroitin, Tetanus toxoid, Fish oil, Ibuprofen, and Naproxen   Social History   Tobacco Use  . Smoking status: Former Smoker    Packs/day: 2.00    Years: 30.00    Pack years: 60.00    Types: Cigarettes    Quit date: 02/27/1998    Years since quitting: 20.8  . Smokeless tobacco: Never Used  Substance Use Topics  . Alcohol use: Yes    Alcohol/week: 14.0 standard drinks    Types: 14 Standard drinks or equivalent per week    Comment: 2 drinks daily ([place])  . Drug use: No     Family Hx: The patient's family history includes Cancer in his mother; Heart attack in his father; Migraines in his brother.  ROS:   Please see the history of present illness.     All other systems reviewed and are negative.   Prior CV studies:   The following studies were reviewed today:  Carotid Dopplers 10/31/2018 Summary: Right Carotid: Velocities in the right ICA are consistent with a 1-39% stenosis.  Left Carotid: Velocities in the left ICA are consistent with a 1-39% stenosis.  Vertebrals: Bilateral vertebral arteries demonstrate antegrade flow.  Echocardiogram 04/17/2017 Study Conclusions  - Left ventricle: The cavity size was normal. Wall thickness was   increased in a pattern of moderate LVH. Systolic function was   normal. The estimated ejection fraction was in the range of 60%   to 65%. Wall motion was normal; there were no regional wall   motion abnormalities. Features are consistent with a pseudonormal   left ventricular filling pattern, with concomitant abnormal   relaxation and increased filling pressure (grade 2 diastolic   dysfunction). - Mitral valve: There was mild regurgitation. - Right ventricle: The cavity size was mildly dilated. Systolic   function was mildly  reduced. - Tricuspid valve: There was mild-moderate regurgitation. - Pulmonary arteries: Systolic pressure was moderately increased.   PA peak pressure: 43 mm Hg (S).    Labs/Other Tests and Data Reviewed:    EKG:  An ECG dated 12/01/2018 was personally reviewed today and demonstrated:  Sinus rhythm, 77 bpm, abnormal R wave progression  Recent Labs: 06/06/2018: Magnesium 1.9 01/02/2019: ALT 29; BUN 14; Creatinine 0.82; Hemoglobin 13.3; Platelet Count 189; Potassium 4.1; Sodium 141   Recent Lipid Panel No results found for: CHOL, TRIG, HDL, CHOLHDL, LDLCALC, LDLDIRECT  Wt Readings from Last 3 Encounters:  01/13/19 210 lb (95.3 kg)  01/06/19 212 lb 9.6 oz (96.4 kg)  12/01/18 209 lb 9.6 oz (95.1 kg)     Objective:    Vital Signs:  BP 125/75   Pulse 72   Ht 5\' 10"  (1.778 m)   Wt 210 lb (95.3 kg)   BMI 30.13 kg/m    VITAL SIGNS:  reviewed GEN:  no acute distress RESPIRATORY:  No cough or wheezing audible over the phone.  Patient is able to speak in complete sentences. NEURO:  alert and oriented x 3, no obvious focal deficit PSYCH:  normal affect  ASSESSMENT & PLAN:    CAD -Patient with dyspnea on exertion and mild chest pressure with activity. -Cardiac CTA showing extensive three-vessel coronary artery disease.  Coronary artery calcium score was 2723.  -Plan for cardiac catheterization to be done on Friday to definitively evaluate the coronary arteries and possible intervention. -The patient understands that risks include but are not limited to stroke (1 in 1000), death (1 in 1000), kidney failure [usually temporary] (1 in 500), bleeding (1 in 200), allergic reaction [possibly serious] (1 in 200).    We also discussed intervention with cardiac stent as well as possible need for bypass surgery depending on extent of CAD. -BMet 01/02/2019 shows normal renal function.  Will recheck metabolic panel and patient needs an EKG which will be done at the time of his pre-procedure Covid  testing. -Will hold Xarelto on the evening prior to cath, with history of stroke we will not hold longer (discusse with Anderson Malta at cath lab) -Continue statin and beta-blocker.  Not on aspirin due to need for anticoagulation.  Paroxysmal atrial fibrillation -Patient is maintained in sinus rhythm on dofetilide, recently more episodes of atrial fibrillation.  He did have an episode yesterday of A. fib and the 160s which responded to an extra dose of metoprolol.  The patient is planned for A. fib ablation after his evaluation for coronary artery disease. -The patient is on Xarelto 15 mg daily for stroke risk reduction (actually dose should be 20 mg due to normal renal function). I conferred with our pharmacist and Dr. Curt Bears. The pt had been kept on 15mg  daily per discharge summary from Dr Waymon Budge on 04/18/17 - he was consulted and recommended lower dosing due to history of GI bleed. Plan was for watchful waiting on Xarelto. With a history of stroke and has had no further bleeding. We will switch him to Eliquis 5 mg BID which is the appropriate dose and carries a lower bleeding risk than Xarelto. He has been instructed to make the switch post cath.   Hypertension -On metoprolol 25 mg twice daily, irbesartan 150 mg daily, Lasix 40 mg 4 times per week -BP well controlled.  -Will hold lasix on day of cath.   Hyperlipidemia, goal LDL<70 -On rosuvastatin 5 mg daily -Lipids have been followed by PCP.  Last lipid panel in epic, care everywhere, LDL 62 in 06/2593  Chronic diastolic heart failure -Patient takes Lasix 4 times per week -No S/S of volume overload.   COVID-19 Education: The signs and symptoms of COVID-19 were discussed with the patient and how to seek care for testing (follow up with PCP or arrange E-visit).  The importance of social distancing was discussed today.  Time:   Today, I have spent 17 minutes with the patient with telehealth technology discussing the above problems.      Medication Adjustments/Labs and Tests Ordered: Current medicines are reviewed at length with the  patient today.  Concerns regarding medicines are outlined above.   Tests Ordered: No orders of the defined types were placed in this encounter. COVID testing, BMet and EKG to be done today in preparation for cath.   Medication Changes:  Will switch Xarelto to Eliquis 5 mg BID to start after cath.  Follow Up:  In Person in 2 week(s) post cath  Signed, Daune Perch, NP  01/13/2019 1:36 PM    [facility]

## 2019-01-13 NOTE — Patient Instructions (Addendum)
Medication Instructions:  Your physician recommends that you continue on your current medications as directed. Please refer to the Current Medication list given to you today.   *If you need a refill on your cardiac medications before your next appointment, please call your pharmacy*  Lab Work: TODAY: BMET & CBC   If you have labs (blood work) drawn today and your tests are completely normal, you will receive your results only by: Marland Kitchen MyChart Message (if you have MyChart) OR . A paper copy in the mail If you have any lab test that is abnormal or we need to change your treatment, we will call you to review the results.  Testing/Procedures: Your physician has requested that you have a cardiac catheterization. Cardiac catheterization is used to diagnose and/or treat various heart conditions. Doctors may recommend this procedure for a number of different reasons. The most common reason is to evaluate chest pain. Chest pain can be a symptom of coronary artery disease (CAD), and cardiac catheterization can show whether plaque is narrowing or blocking your heart's arteries. This procedure is also used to evaluate the valves, as well as measure the blood flow and oxygen levels in different parts of your heart. For further information please visit [email or web address]. Please follow instruction sheet, as given.   Follow up:     Other Instructions     [name] [place], [place] [address] Dept: [phone] Loc: [phone]  George [facility]ega  01/13/2019  You are scheduled for a Cardiac Catheterization on Friday, November 6 with Dr. Larae Grooms.  1. Please arrive at the [facility] (Main Entrance A) at [facility]: [address] [place], [place] [address] at 7:00 AM (This time is two hours before your procedure to ensure your preparation). Free valet parking service is  available.   Special note: Every effort is made to have your procedure done on time. Please understand that emergencies sometimes delay scheduled procedures.  2. Diet: Do not eat solid foods after midnight.  The patient may have clear liquids until 5am upon the day of the procedure.  3. Medication instructions in preparation for your procedure:  Stop taking Xarelto on Thursday evening 01/15/2019   Do not take lasix the morning of your procedure    On the morning of your procedure, take your Aspirin and any morning medicines NOT listed above.  You may use sips of water.  5. Plan for one night stay--bring personal belongings. 6. Bring a current list of your medications and current insurance cards. 7. You MUST have a responsible person to drive you home. 8. Someone MUST be with you the first 24 hours after you arrive home or your discharge will be delayed. 9. Please wear clothes that are easy to get on and off and wear slip-on shoes.  Thank you for allowing [place] to care for you!   -- [facility]

## 2019-01-13 NOTE — Telephone Encounter (Signed)
Pt is agreeable to switch to Eliquis 5 mg twice a day after cath

## 2019-01-13 NOTE — Progress Notes (Signed)
Virtual Visit via Telephone Note   This visit type was conducted due to national recommendations for restrictions regarding the COVID-19 Pandemic (e.g. social distancing) in an effort to limit this patient's exposure and mitigate transmission in our community.  Due to his co-morbid illnesses, this patient is at least at moderate risk for complications without adequate follow up.  This format is felt to be most appropriate for this patient at this time.  The patient did not have access to video technology/had technical difficulties with video requiring transitioning to audio format only (telephone).  All issues noted in this document were discussed and addressed.  No physical exam could be performed with this format.  Please refer to the patient's chart for his  consent to telehealth for [facility].   Date:  01/13/2019   ID:  George Vega, DOB 1941/01/05, MRN [id number]  Patient Location: Home Provider Location: Home  PCP:  [name], MD  Cardiologist:  Cristopher Peru, MD  Electrophysiologist:  None   Evaluation Performed:  Follow-Up Visit  Chief Complaint: CAD noted on cardiac CTA  History of Present Illness:    George Vega is a 78 y.o. male with a history of paroxysmal atrial fibrillation, hyperlipidemia and diastolic heart failure.  He is followed by electrophysiology for atrial fibrillation and has been controlled on dofetilide.  He is on Xarelto for stroke risk reduction.  He was seen in the office on 01/06/2019 by Dr. Curt Bears at which time he was noted to have been having more atrial fibrillation over the last several months.  He was planned for an A. fib ablation, however, a cardiac CTA revealed moderate left main disease, extensive plaque in the LAD which appeared subtotally occluded, possible moderate circumflex disease and moderate to possible severe RCA disease.  The patient is being seen today to discuss his CTA results and plan for cardiac catheterization on  Friday.  George Vega reports that he has occ feeling of chest discomfort, not pain, with his episodes of afib. Yesterday he had a bout of afib in the 160s which responed to an extra dose of metoprolol after about 45 minutes. He is also noting DOE and mild chest pressure while going about 100 feet walking to the mailbox. He has no orthopnea, PND or edema. He says that his activity is limited due to knee pain and shortness of breath.   The patient does not have symptoms concerning for COVID-19 infection (fever, chills, cough, or new shortness of breath).    Past Medical History:  Diagnosis Date  . Arthritis   . Atrial fibrillation, persistent ([place])   . Chronic sinusitis   . Coronary artery disease   . Depression   . Difficult intubation    was told with shoulder done 2006-alittle narrow  . Dysrhythmia    A. Fib  . 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 Hypertension   . Jejunostomy tube fell out    when asked about this in 04/2017, pt denied ever having a J tube, feeding tube, tubes placed post surgery so ??? veracity of a previous J tube.    . 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 Spastic colon   . Squamous cell carcinoma of lung, stage I, right ([place]) 05/07/2018   bx 04/29/18; isolated PET uptake in RLL mass  . Stroke (cerebrum) ([place])   . SVT (supraventricular tachycardia) ([facility])    Past Surgical History:  Procedure Laterality Date  . CARDIAC CATHETERIZATION    .  CATARACT EXTRACTION, BILATERAL    . COLONOSCOPY WITH PROPOFOL N/A 04/17/2017   Procedure: COLONOSCOPY WITH PROPOFOL;  Surgeon: Yetta Flock, MD;  Location: [place];  Service: Gastroenterology;  Laterality: N/A;  . ESOPHAGEAL DILATION  2017  . ESOPHAGOGASTRODUODENOSCOPY (EGD) WITH PROPOFOL N/A 04/17/2017   Procedure: ESOPHAGOGASTRODUODENOSCOPY (EGD) WITH PROPOFOL;  Surgeon: Yetta Flock, MD;  Location: [place];  Service: Gastroenterology;  Laterality: N/A;  . EYE SURGERY    . FOOT NEUROMA  SURGERY  2002  . LEFT HEART CATHETERIZATION WITH CORONARY ANGIOGRAM Right 06/14/2011   20% LM, chronic occluded mid LAD, 50% ostial LCX, 20% mid RI, RCA with collaterals to mid LAD, mid 10% stenosis, EF 60% 06/14/11  . NASAL SINUS SURGERY    . SHOULDER ARTHROSCOPY  03/01/2011   Procedure: ARTHROSCOPY SHOULDER;  Surgeon: Ninetta Lights, MD;  Location: [place];  Service: Orthopedics;  Laterality: Right;  arthroscopy shoulder decompression subacromial partial acromioplasty with coracoacromial release, distal claviculectomy, debridement of labrium  . SHOULDER ARTHROSCOPY  03/01/2011   Procedure: ARTHROSCOPY SHOULDER;  Surgeon: Ninetta Lights, MD;  Location: [place];  Service: Orthopedics;  Laterality: Right;  arthroscopy shoulder decompression subacromial partial acromioplasty with coracoacromial release, distal claviculectomy, debridement of labrium  . TEE WITHOUT CARDIOVERSION N/A 03/19/2016   Procedure: TRANSESOPHAGEAL ECHOCARDIOGRAM (TEE);  Surgeon: Larey Dresser, MD;  Location: [place];  Service: Cardiovascular;  Laterality: N/A;  . TOTAL KNEE ARTHROPLASTY Right 09/23/2013   Procedure: TOTAL KNEE ARTHROPLASTY;  Surgeon: Ninetta Lights, MD;  Location: [place];  Service: Orthopedics;  Laterality: Right;  . TOTAL SHOULDER ARTHROPLASTY  06/28/2011   Procedure: TOTAL SHOULDER ARTHROPLASTY;  Surgeon: Ninetta Lights, MD;  Location: [place];  Service: Orthopedics;  Laterality: Right;  . UVULOPALATOPHARYNGOPLASTY    . VIDEO ASSISTED THORACOSCOPY (VATS)/WEDGE RESECTION Right 06/02/2018   Procedure: VIDEO ASSISTED THORACOSCOPY (VATS)/WEDGE RESECTION with Lymph node disection and intercostal nerve block.;  Surgeon: [name], MD;  Location[facility];  Service: Thoracic;  Laterality: Right;     Current Meds  Medication Sig  . acetaminophen (TYLENOL) 325 MG tablet Take 650 mg by mouth every 6 (six) hours as needed for moderate pain.  .  Cholecalciferol (VITAMIN D3) 5000 units TABS Take 5,000 Units by mouth daily.  . Cyanocobalamin (VITAMIN B-12) 500 MCG SUBL Place 500 mcg under the tongue 3 (three) times a week.   . dofetilide (TIKOSYN) 250 MCG capsule TAKE 1 CAPSULE BY MOUTH TWO TIMES DAILY (Patient taking differently: Take 250 mcg by mouth 2 (two) times daily. )  . ferrous sulfate (FEROSUL) 325 (65 FE) MG tablet Take 1 tablet (325 mg total) by mouth 2 (two) times daily with a meal.  . furosemide (LASIX) 40 MG tablet TAKE 1 TABLET(40 MG) BY MOUTH DAILY (Patient taking differently: Take 40 mg by mouth 4 (four) times a week. )  . irbesartan (AVAPRO) 150 MG tablet TAKE 1 TABLET(150 MG) BY MOUTH DAILY (Patient taking differently: Take 150 mg by mouth daily. TAKE 1 TABLET(150 MG) BY MOUTH DAILY)  . magnesium oxide (MAG-OX) 400 MG tablet Take 400 mg by mouth daily.  . Melatonin 10 MG TABS Take 10 mg by mouth at bedtime as needed (sleep).   . metoprolol tartrate (LOPRESSOR) 25 MG tablet TAKE 1 TABLET BY MOUTH 2  TIMES DAILY.  . metoprolol tartrate (LOPRESSOR) 25 MG tablet Take 1-2 tablets (25-50 mg total) by mouth daily as needed (for breakthrough palpitations).  . Multiple  Vitamins-Minerals (PRESERVISION AREDS 2 PO) Take 1 capsule by mouth 2 (two) times daily.  . pantoprazole (PROTONIX) 40 MG tablet Take 40 mg by mouth daily.   Marland Kitchen PARoxetine (PAXIL) 30 MG tablet Take 30 mg by mouth at bedtime.   . polycarbophil (FIBERCON) 625 MG tablet Take 1,250 mg by mouth at bedtime.   [name] Glycol-Propyl Glycol (SYSTANE OP) Place 2 drops into both eyes 2 (two) times daily as needed (dry eyes).   . potassium chloride (K-DUR) 10 MEQ tablet TAKE 1 TABLET BY MOUTH EVERY DAY FOR 3 DAYS. THEN TAKE ONLY WHEN TAKING LASIX AS DIRECTED. (Patient taking differently: Take 10 mEq by mouth 4 (four) times a week. Takes with Lsix)  . rosuvastatin (CRESTOR) 5 MG tablet TAKE 1 TABLET BY MOUTH  EVERY MORNING  . tamsulosin (FLOMAX) 0.4 MG CAPS capsule Take 0.4 mg  by mouth daily.  . [DISCONTINUED] XARELTO 15 MG TABS tablet TAKE 1 TABLET BY MOUTH  DAILY WITH SUPPER (Patient taking differently: Take 15 mg by mouth daily with supper. )     Allergies:   Glucosamine-chondroitin, Tetanus toxoid, Fish oil, Ibuprofen, and Naproxen   Social History   Tobacco Use  . Smoking status: Former Smoker    Packs/day: 2.00    Years: 30.00    Pack years: 60.00    Types: Cigarettes    Quit date: 02/27/1998    Years since quitting: 20.8  . Smokeless tobacco: Never Used  Substance Use Topics  . Alcohol use: Yes    Alcohol/week: 14.0 standard drinks    Types: 14 Standard drinks or equivalent per week    Comment: 2 drinks daily ([place])  . Drug use: No     Family Hx: The patient's family history includes Cancer in his mother; Heart attack in his father; Migraines in his brother.  ROS:   Please see the history of present illness.     All other systems reviewed and are negative.   Prior CV studies:   The following studies were reviewed today:  Carotid Dopplers 10/31/2018 Summary: Right Carotid: Velocities in the right ICA are consistent with a 1-39% stenosis.  Left Carotid: Velocities in the left ICA are consistent with a 1-39% stenosis.  Vertebrals: Bilateral vertebral arteries demonstrate antegrade flow.  Echocardiogram 04/17/2017 Study Conclusions  - Left ventricle: The cavity size was normal. Wall thickness was   increased in a pattern of moderate LVH. Systolic function was   normal. The estimated ejection fraction was in the range of 60%   to 65%. Wall motion was normal; there were no regional wall   motion abnormalities. Features are consistent with a pseudonormal   left ventricular filling pattern, with concomitant abnormal   relaxation and increased filling pressure (grade 2 diastolic   dysfunction). - Mitral valve: There was mild regurgitation. - Right ventricle: The cavity size was mildly dilated. Systolic   function was mildly  reduced. - Tricuspid valve: There was mild-moderate regurgitation. - Pulmonary arteries: Systolic pressure was moderately increased.   PA peak pressure: 43 mm Hg (S).    Labs/Other Tests and Data Reviewed:    EKG:  An ECG dated 12/01/2018 was personally reviewed today and demonstrated:  Sinus rhythm, 77 bpm, abnormal R wave progression  Recent Labs: 06/06/2018: Magnesium 1.9 01/02/2019: ALT 29; BUN 14; Creatinine 0.82; Hemoglobin 13.3; Platelet Count 189; Potassium 4.1; Sodium 141   Recent Lipid Panel No results found for: CHOL, TRIG, HDL, CHOLHDL, LDLCALC, LDLDIRECT  Wt Readings from Last 3 Encounters:  01/13/19 210 lb (95.3 kg)  01/06/19 212 lb 9.6 oz (96.4 kg)  12/01/18 209 lb 9.6 oz (95.1 kg)     Objective:    Vital Signs:  BP 125/75   Pulse 72   Ht 5\' 10"  (1.778 m)   Wt 210 lb (95.3 kg)   BMI 30.13 kg/m    VITAL SIGNS:  reviewed GEN:  no acute distress RESPIRATORY:  No cough or wheezing audible over the phone.  Patient is able to speak in complete sentences. NEURO:  alert and oriented x 3, no obvious focal deficit PSYCH:  normal affect  ASSESSMENT & PLAN:    CAD -Patient with dyspnea on exertion and mild chest pressure with activity. -Cardiac CTA showing extensive three-vessel coronary artery disease.  Coronary artery calcium score was 2723.  -Plan for cardiac catheterization to be done on Friday to definitively evaluate the coronary arteries and possible intervention. -The patient understands that risks include but are not limited to stroke (1 in 1000), death (1 in 1000), kidney failure [usually temporary] (1 in 500), bleeding (1 in 200), allergic reaction [possibly serious] (1 in 200).    We also discussed intervention with cardiac stent as well as possible need for bypass surgery depending on extent of CAD. -BMet 01/02/2019 shows normal renal function.  Will recheck metabolic panel and patient needs an EKG which will be done at the time of his pre-procedure Covid  testing. -Will hold Xarelto on the evening prior to cath, with history of stroke we will not hold longer (discusse with Anderson Malta at cath lab) -Continue statin and beta-blocker.  Not on aspirin due to need for anticoagulation.  Paroxysmal atrial fibrillation -Patient is maintained in sinus rhythm on dofetilide, recently more episodes of atrial fibrillation.  He did have an episode yesterday of A. fib and the 160s which responded to an extra dose of metoprolol.  The patient is planned for A. fib ablation after his evaluation for coronary artery disease. -The patient is on Xarelto 15 mg daily for stroke risk reduction (actually dose should be 20 mg due to normal renal function). I conferred with our pharmacist and Dr. Curt Bears. The pt had been kept on 15mg  daily per discharge summary from Dr Waymon Budge on 04/18/17 - he was consulted and recommended lower dosing due to history of GI bleed. Plan was for watchful waiting on Xarelto. With a history of stroke and has had no further bleeding. We will switch him to Eliquis 5 mg BID which is the appropriate dose and carries a lower bleeding risk than Xarelto. He has been instructed to make the switch post cath.   Hypertension -On metoprolol 25 mg twice daily, irbesartan 150 mg daily, Lasix 40 mg 4 times per week -BP well controlled.  -Will hold lasix on day of cath.   Hyperlipidemia, goal LDL<70 -On rosuvastatin 5 mg daily -Lipids have been followed by PCP.  Last lipid panel in epic, care everywhere, LDL 62 in 11/4494  Chronic diastolic heart failure -Patient takes Lasix 4 times per week -No S/S of volume overload.   COVID-19 Education: The signs and symptoms of COVID-19 were discussed with the patient and how to seek care for testing (follow up with PCP or arrange E-visit).  The importance of social distancing was discussed today.  Time:   Today, I have spent 17 minutes with the patient with telehealth technology discussing the above problems.      Medication Adjustments/Labs and Tests Ordered: Current medicines are reviewed at length with the  patient today.  Concerns regarding medicines are outlined above.   Tests Ordered: No orders of the defined types were placed in this encounter. COVID testing, BMet and EKG to be done today in preparation for cath.   Medication Changes:  Will switch Xarelto to Eliquis 5 mg BID to start after cath.  Follow Up:  In Person in 2 week(s) post cath  Signed, Daune Perch, NP  01/13/2019 1:36 PM    [facility]

## 2019-01-13 NOTE — Addendum Note (Signed)
Addended by: Mendel Ryder on: 01/13/2019 01:29 PM   Modules accepted: Orders

## 2019-01-13 NOTE — Progress Notes (Signed)
EKG for pre cath work up completed at this time.  Signed by Dr. Radford Pax and sent to be scanned into chart.

## 2019-01-13 NOTE — Telephone Encounter (Signed)
-----   Message from Daune Perch, NP sent at 01/13/2019 11:53 AM EST ----- Regarding: FW: Per discussion with pharmacist and Dr. Curt Bears, switch xarelto to Eliquis 5 mg BID. Inform pt that Eliquis will help prevent stroke related to afib, but has a lower bleeding risk than Xarelto so we think it would be better for him. He can switch after cath.  ----- Message ----- From: Constance Haw, MD Sent: 01/13/2019  10:07 AM EST To: Daune Perch, NP, Leeroy Bock, RPH Subject: RE:                                            OK with the switch. Thanks. ----- Message ----- From: Daune Perch, NP Sent: 01/13/2019  10:00 AM EST To: Will Meredith Leeds, MD, Leeroy Bock, RPH Subject: FW:                                            Ok, thanks. I will forward this to Dr Curt Bears for consideration. ----- Message ----- From: Leeroy Bock, [employer]: 01/13/2019   9:50 AM EST To: Daune Perch, NP  Stark Bray,  His CrCl is ~100 so he would qualify for Xarelto 20mg  daily dosing - we kept him on 15mg  daily per discharge summary from Dr Waymon Budge on 04/18/17 - he was consulted and recommended lower dosing due to history of GI bleed. Subsequent office notes from Dr Curt Bears and Dr Lovena Le mentioned continuing watchful waiting on Xarelto. With a history of stroke I agree it makes things a bit more complicated since we don't want to be under-anticoagulating him either. It might make sense to change him to Eliquis instead - there is a much lower incidence of bleeding than what we see with Xarelto. He would qualify for 5mg  BID dosing if you wish to make the change.  Thanks, Jinny Blossom ----- Message ----- From: Daune Perch, NP Sent: 01/13/2019   9:24 AM EST To: [facility] Anticoag  Hi. This pt is on Xarelto 15 mg for afib with hx of stroke. I don't see why he is on reduced dose and he could not tell me. He denies hist of any bleeding issues. Can you look at this and advise on correct dose  please?  Thanks, Pecolia Ades

## 2019-01-14 LAB — NOVEL CORONAVIRUS, NAA (HOSP ORDER, SEND-OUT TO REF LAB; TAT 18-24 HRS): SARS-CoV-2, NAA: NOT DETECTED

## 2019-01-15 ENCOUNTER — Telehealth: Payer: Self-pay | Admitting: *Deleted

## 2019-01-15 NOTE — Telephone Encounter (Signed)
Pt contacted pre-catheterization scheduled at [facility] for: Friday January 16, 2019 9 AM Verified arrival time and place: [facility]) at: 7AM   No solid food after midnight prior to cath, clear liquids until 5 AM day of procedure. Contrast allergy: no  Hold: Xarelto-day prior to procedure until post procedure-see note office note 01/13/19 Lasix/KCl-AM of procedure  Except hold medications AM meds can be  taken pre-cath with sip of water including: ASA 81 mg   Confirmed patient has responsible adult to drive home post procedure and observe 24 hours after arriving home: yes  Currently, due to Covid-19 pandemic, only one support person will be allowed with patient. Must be the same support person for that patient's entire stay, will be screened and required to wear a mask. They will be asked to wait in the waiting room for the duration of the patient's stay.  Patients are required to wear a mask when they enter the hospital.      COVID-19 Pre-Screening Questions:  . In the past 7 to 10 days have you had a cough,  shortness of breath, headache, congestion, fever (100 or greater) body aches, chills, sore throat, or sudden loss of taste or sense of smell? no . Have you been around anyone with known Covid 19? no . Have you been around anyone who is awaiting Covid 19 test results in the past 7 to 10 days? no . Have you been around anyone who has been exposed to Covid 19, or has mentioned symptoms of Covid 19 within the past 7 to 10 days? no  I reviewed procedure/mask/visitor instructions, Covid-19 screening questions with patient's wife (DPR), Mardene Celeste, she verbalized understanding, thanked me for call.

## 2019-01-16 ENCOUNTER — Other Ambulatory Visit: Payer: Self-pay | Admitting: Cardiology

## 2019-01-16 ENCOUNTER — Other Ambulatory Visit: Payer: Self-pay

## 2019-01-16 ENCOUNTER — Ambulatory Visit (HOSPITAL_COMMUNITY)
Admission: RE | Admit: 2019-01-16 | Discharge: 2019-01-16 | Disposition: A | Discharge status: Home / Self Care | Payer: Medicare Other | Attending: Interventional Cardiology | Admitting: Interventional Cardiology

## 2019-01-16 ENCOUNTER — Ambulatory Visit (HOSPITAL_COMMUNITY): Admit: 2019-01-16 | Payer: Medicare Other | Admitting: Cardiology

## 2019-01-16 ENCOUNTER — Other Ambulatory Visit: Payer: Self-pay | Admitting: Internal Medicine

## 2019-01-16 ENCOUNTER — Telehealth: Payer: Self-pay | Admitting: Cardiology

## 2019-01-16 ENCOUNTER — Encounter (HOSPITAL_COMMUNITY)
Admission: RE | Disposition: A | Discharge status: Home / Self Care | Payer: Self-pay | Source: Home / Self Care | Attending: Interventional Cardiology

## 2019-01-16 ENCOUNTER — Encounter (HOSPITAL_COMMUNITY): Payer: Self-pay

## 2019-01-16 DIAGNOSIS — I480 Paroxysmal atrial fibrillation: Secondary | ICD-10-CM | POA: Diagnosis not present

## 2019-01-16 DIAGNOSIS — I5032 Chronic diastolic (congestive) heart failure: Secondary | ICD-10-CM | POA: Diagnosis not present

## 2019-01-16 DIAGNOSIS — F329 Major depressive disorder, single episode, unspecified: Secondary | ICD-10-CM | POA: Insufficient documentation

## 2019-01-16 DIAGNOSIS — Z87891 Personal history of nicotine dependence: Secondary | ICD-10-CM | POA: Insufficient documentation

## 2019-01-16 DIAGNOSIS — Z8673 Personal history of transient ischemic attack (TIA), and cerebral infarction without residual deficits: Secondary | ICD-10-CM | POA: Diagnosis not present

## 2019-01-16 DIAGNOSIS — Z79899 Other long term (current) drug therapy: Secondary | ICD-10-CM | POA: Insufficient documentation

## 2019-01-16 DIAGNOSIS — Z7901 Long term (current) use of anticoagulants: Secondary | ICD-10-CM | POA: Diagnosis not present

## 2019-01-16 DIAGNOSIS — I110 Hypertensive heart disease with heart failure: Secondary | ICD-10-CM | POA: Diagnosis not present

## 2019-01-16 DIAGNOSIS — E785 Hyperlipidemia, unspecified: Secondary | ICD-10-CM | POA: Diagnosis not present

## 2019-01-16 DIAGNOSIS — I25118 Atherosclerotic heart disease of native coronary artery with other forms of angina pectoris: Secondary | ICD-10-CM | POA: Insufficient documentation

## 2019-01-16 HISTORY — PX: LEFT HEART CATH AND CORONARY ANGIOGRAPHY: CATH118249

## 2019-01-16 SURGERY — LEFT HEART CATH AND CORONARY ANGIOGRAPHY
Anesthesia: LOCAL

## 2019-01-16 SURGERY — ATRIAL FIBRILLATION ABLATION
Anesthesia: General

## 2019-01-16 MED ORDER — LIDOCAINE HCL (PF) 1 % IJ SOLN
INTRAMUSCULAR | Status: DC | PRN
  Administered 2019-01-16: 2 mL

## 2019-01-16 MED ORDER — IOHEXOL 350 MG/ML SOLN
INTRAVENOUS | Status: DC | PRN
  Administered 2019-01-16: 25 mL

## 2019-01-16 MED ORDER — FENTANYL CITRATE (PF) 100 MCG/2ML IJ SOLN
INTRAMUSCULAR | Status: AC
  Filled 2019-01-16: qty 2

## 2019-01-16 MED ORDER — LIDOCAINE HCL (PF) 1 % IJ SOLN
INTRAMUSCULAR | Status: AC
  Filled 2019-01-16: qty 30

## 2019-01-16 MED ORDER — SODIUM CHLORIDE 0.9 % WEIGHT BASED INFUSION
3.0000 mL/kg/h | INTRAVENOUS | Status: AC
  Administered 2019-01-16: 08:00:00 3 mL/kg/h via INTRAVENOUS

## 2019-01-16 MED ORDER — METOPROLOL TARTRATE 5 MG/5ML IV SOLN
INTRAVENOUS | Status: DC | PRN
  Administered 2019-01-16 (×2): 2.5 mg via INTRAVENOUS

## 2019-01-16 MED ORDER — SODIUM CHLORIDE 0.9 % IV SOLN
INTRAVENOUS | Status: AC | PRN
  Administered 2019-01-16: 250 mL via INTRAVENOUS

## 2019-01-16 MED ORDER — HEPARIN SODIUM (PORCINE) 1000 UNIT/ML IJ SOLN
INTRAMUSCULAR | Status: AC
  Filled 2019-01-16: qty 1

## 2019-01-16 MED ORDER — METOPROLOL TARTRATE 5 MG/5ML IV SOLN
INTRAVENOUS | Status: AC
  Filled 2019-01-16: qty 5

## 2019-01-16 MED ORDER — MIDAZOLAM HCL 2 MG/2ML IJ SOLN
INTRAMUSCULAR | Status: DC | PRN
  Administered 2019-01-16: 1 mg via INTRAVENOUS

## 2019-01-16 MED ORDER — HEPARIN (PORCINE) IN NACL 1000-0.9 UT/500ML-% IV SOLN
INTRAVENOUS | Status: AC
  Filled 2019-01-16: qty 1000

## 2019-01-16 MED ORDER — SODIUM CHLORIDE 0.9 % IV SOLN: 250.0000 mL | INTRAVENOUS | Status: DC | PRN

## 2019-01-16 MED ORDER — HEPARIN (PORCINE) IN NACL 1000-0.9 UT/500ML-% IV SOLN
INTRAVENOUS | Status: DC | PRN
  Administered 2019-01-16 (×2): 500 mL

## 2019-01-16 MED ORDER — SODIUM CHLORIDE 0.9 % WEIGHT BASED INFUSION: 1.0000 mL/kg/h | INTRAVENOUS | Status: DC

## 2019-01-16 MED ORDER — APIXABAN 5 MG PO TABS: 5.0000 mg | ORAL_TABLET | Freq: Two times a day (BID) | ORAL | 2 refills | Status: DC

## 2019-01-16 MED ORDER — FENTANYL CITRATE (PF) 100 MCG/2ML IJ SOLN
INTRAMUSCULAR | Status: DC | PRN
  Administered 2019-01-16: 25 ug via INTRAVENOUS

## 2019-01-16 MED ORDER — DOFETILIDE 250 MCG PO CAPS
250.0000 ug | ORAL_CAPSULE | Freq: Two times a day (BID) | ORAL | Status: DC
  Administered 2019-01-16: 250 ug via ORAL
  Filled 2019-01-16: qty 1

## 2019-01-16 MED ORDER — VERAPAMIL HCL 2.5 MG/ML IV SOLN
INTRAVENOUS | Status: DC | PRN
  Administered 2019-01-16: 10 mL via INTRA_ARTERIAL

## 2019-01-16 MED ORDER — SODIUM CHLORIDE 0.9 % IV SOLN
INTRAVENOUS | Status: AC
  Administered 2019-01-16: 250 mL via INTRAVENOUS
  Administered 2019-01-16: 13:00:00 via INTRAVENOUS

## 2019-01-16 MED ORDER — ASPIRIN 81 MG PO CHEW: 81.0000 mg | CHEWABLE_TABLET | ORAL | Status: DC

## 2019-01-16 MED ORDER — VERAPAMIL HCL 2.5 MG/ML IV SOLN
INTRAVENOUS | Status: AC
  Filled 2019-01-16: qty 2

## 2019-01-16 MED ORDER — HEPARIN SODIUM (PORCINE) 1000 UNIT/ML IJ SOLN
INTRAMUSCULAR | Status: DC | PRN
  Administered 2019-01-16: 5000 [IU] via INTRAVENOUS

## 2019-01-16 MED ORDER — SODIUM CHLORIDE 0.9% FLUSH: 3.0000 mL | INTRAVENOUS | Status: DC | PRN

## 2019-01-16 MED ORDER — MIDAZOLAM HCL 2 MG/2ML IJ SOLN
INTRAMUSCULAR | Status: AC
  Filled 2019-01-16: qty 2

## 2019-01-16 SURGICAL SUPPLY — 9 items

## 2019-01-16 NOTE — Discharge Instructions (Signed)
Radial Site Care ° °This sheet gives you information about how to care for yourself after your procedure. Your health care provider may also give you more specific instructions. If you have problems or questions, contact your health care provider. °What can I expect after the procedure? °After the procedure, it is common to have: °· Bruising and tenderness at the catheter insertion area. °Follow these instructions at home: °Medicines °· Take over-the-counter and prescription medicines only as told by your health care provider. °Insertion site care °· Follow instructions from your health care provider about how to take care of your insertion site. Make sure you: °? Wash your hands with soap and water before you change your bandage (dressing). If soap and water are not available, use hand sanitizer. °? Change your dressing as told by your health care provider. °? Leave stitches (sutures), skin glue, or adhesive strips in place. These skin closures may need to stay in place for 2 weeks or longer. If adhesive strip edges start to loosen and curl up, you may trim the loose edges. Do not remove adhesive strips completely unless your health care provider tells you to do that. °· Check your insertion site every day for signs of infection. Check for: °? Redness, swelling, or pain. °? Fluid or blood. °? Pus or a bad smell. °? Warmth. °· Do not take baths, swim, or use a hot tub until your health care provider approves. °· You may shower 24-48 hours after the procedure, or as directed by your health care provider. °? Remove the dressing and gently wash the site with plain soap and water. °? Pat the area dry with a clean towel. °? Do not rub the site. That could cause bleeding. °· Do not apply powder or lotion to the site. °Activity ° °· For 24 hours after the procedure, or as directed by your health care provider: °? Do not flex or bend the affected arm. °? Do not push or pull heavy objects with the affected arm. °? Do not  drive yourself home from the hospital or clinic. You may drive 24 hours after the procedure unless your health care provider tells you not to. °? Do not operate machinery or power tools. °· Do not lift anything that is heavier than 10 lb (4.5 kg), or the limit that you are told, until your health care provider says that it is safe. °· Ask your health care provider when it is okay to: °? Return to work or school. °? Resume usual physical activities or sports. °? Resume sexual activity. °General instructions °· If the catheter site starts to bleed, raise your arm and put firm pressure on the site. If the bleeding does not stop, get help right away. This is a medical emergency. °· If you went home on the same day as your procedure, a responsible adult should be with you for the first 24 hours after you arrive home. °· Keep all follow-up visits as told by your health care provider. This is important. °Contact a health care provider if: °· You have a fever. °· You have redness, swelling, or yellow drainage around your insertion site. °Get help right away if: °· You have unusual pain at the radial site. °· The catheter insertion area swells very fast. °· The insertion area is bleeding, and the bleeding does not stop when you hold steady pressure on the area. °· Your arm or hand becomes pale, cool, tingly, or numb. °These symptoms may represent a serious problem   that is an emergency. Do not wait to see if the symptoms will go away. Get medical help right away. Call your local emergency services (911 in the [place] Do not drive yourself to the hospital. °Summary °· After the procedure, it is common to have bruising and tenderness at the site. °· Follow instructions from your health care provider about how to take care of your radial site wound. Check the wound every day for signs of infection. °· Do not lift anything that is heavier than 10 lb (4.5 kg), or the limit that you are told, until your health care provider says  that it is safe. °This information is not intended to replace advice given to you by your health care provider. Make sure you discuss any questions you have with your health care provider. °Document Released: 03/31/2010 Document Revised: 04/03/2017 Document Reviewed: 04/03/2017 °Elsevier Patient Education © 2020 [employer] ° °

## 2019-01-16 NOTE — Telephone Encounter (Signed)
Patient's wife is wondering if he has been scheduled for his heart ablation surgery on 02/02/19. She said if he is not scheduled, can that be scheduled that way he does not have to wait until January

## 2019-01-16 NOTE — Telephone Encounter (Signed)
Pt last saw Daune Perch, NP on 01/13/19, last labs 01/13/19 Creat 1.06, age 78, weight 95.3kg, CrCl 77.42, based on CrCl pt is not on appropriate dosage of Xarelto. Per Daune Perch, NP OV note on 01/13/19-The patient is on Xarelto 15 mg daily for stroke risk reduction (actually dose should be 20 mg due to normal renal function). I conferred with our pharmacist and Dr. Curt Bears. The pt had been kept on 15mg  daily per discharge summary from Dr Waymon Budge on 04/18/17 - he was consulted and recommended lower dosing due to history of GI bleed.Plan was for watchful waiting on Xarelto. With a history of stroke and has had no further bleeding. We will switch him to Eliquis 5 mg BID which is the appropriate dose and carries a lower bleeding risk than Xarelto. He has been instructed to make the switch post cath. Eliquis rx was sent in on 01/13/19, cardiac cath is today 01/16/19, therefor will refuse Xarelto refill as it is not appropriate at this time per OV note pt is switching to Eliquis 5mg  BID.

## 2019-01-16 NOTE — Telephone Encounter (Signed)
Left message with wife that there are no spots for November. Informed her that I would hold spot for December and have [facility] review today's catheterization.  Made aware that I will follow up with them next week about this matter.

## 2019-01-16 NOTE — Progress Notes (Addendum)
Pt bp high is 92/52 to 79/60, pt  States he is not dizzy but feels out of sorts. Called cardiology Vin PA, pt to have a 500 ml bolus in 1 hour. I am to call Vin back when bolus is done.  Called Vin PA, pressures are better, pt ok to leave but per Vin PA pt to hold metoprolol tonight. Patricia/ wife is in care of his medications and was informed/ verbalized understanding to hold med and will reassess in the morning, she will call Dr if low.

## 2019-01-16 NOTE — Interval H&P Note (Signed)
Cath Lab Visit (complete for each Cath Lab visit)  Clinical Evaluation Leading to the Procedure:   ACS: No.  Non-ACS:    Anginal Classification: CCS III  Anti-ischemic medical therapy: Minimal Therapy (1 class of medications)  Non-Invasive Test Results: Intermediate-risk stress test findings: cardiac mortality 1-3%/year  Prior CABG: No previous CABG    Back in AFib RVR at the start of the procedure.  Metoprolol 2.5 mg given to help slow the HR.     History and Physical Interval Note:  01/16/2019 8:37 AM  George Vega  has presented today for surgery, with the diagnosis of chest pain.  The various methods of treatment have been discussed with the patient and family. After consideration of risks, benefits and other options for treatment, the patient has consented to  Procedure(s): LEFT HEART CATH AND CORONARY ANGIOGRAPHY (N/A) as a surgical intervention.  The patient's history has been reviewed, patient examined, no change in status, stable for surgery.  I have reviewed the patient's chart and labs.  Questions were answered to the patient's satisfaction.     Larae Grooms

## 2019-01-19 ENCOUNTER — Encounter (HOSPITAL_COMMUNITY): Payer: Self-pay | Admitting: Interventional Cardiology

## 2019-01-19 NOTE — Telephone Encounter (Signed)
Called pt to inform him that I have 12/2 ablation date for him. Pt not at home, but spoke to wife who verbalized pt will take that day. Aware that we will go over instructions at next week's OV on 11/20. Wife agreeable to plan.

## 2019-01-21 NOTE — Telephone Encounter (Signed)
Reviewed with pt procedure rescheduled for 12/2. Aware that I will send ablation instructions via Mychart and we will go over them at his [place] 11/20. Patient verbalized understanding and agreeable to plan.

## 2019-01-28 ENCOUNTER — Ambulatory Visit: Payer: Medicare Other | Admitting: Cardiology

## 2019-01-30 ENCOUNTER — Telehealth: Payer: Self-pay | Admitting: Cardiology

## 2019-01-30 ENCOUNTER — Other Ambulatory Visit: Payer: Self-pay

## 2019-01-30 ENCOUNTER — Encounter: Payer: Self-pay | Admitting: Cardiology

## 2019-01-30 ENCOUNTER — Ambulatory Visit (INDEPENDENT_AMBULATORY_CARE_PROVIDER_SITE_OTHER): Payer: Medicare Other | Admitting: Cardiology

## 2019-01-30 VITALS — BP 112/72 | HR 77 | Ht 70.0 in | Wt 213.0 lb

## 2019-01-30 DIAGNOSIS — I10 Essential (primary) hypertension: Secondary | ICD-10-CM

## 2019-01-30 DIAGNOSIS — I2510 Atherosclerotic heart disease of native coronary artery without angina pectoris: Secondary | ICD-10-CM | POA: Diagnosis not present

## 2019-01-30 DIAGNOSIS — I4819 Other persistent atrial fibrillation: Secondary | ICD-10-CM | POA: Diagnosis not present

## 2019-01-30 DIAGNOSIS — Z01810 Encounter for preprocedural cardiovascular examination: Secondary | ICD-10-CM | POA: Diagnosis not present

## 2019-01-30 NOTE — Progress Notes (Signed)
Electrophysiology Office Note   Date:  01/30/2019   ID:  Pasquale, Matters [birth year], MRN [id number]  PCP:  [name], MD  Cardiologist:  Lovena Le Primary Electrophysiologist: Lovena Le    No chief complaint on file.    History of Present Illness: George Vega is a 78 y.o. male who is being seen today for the evaluation of atrial fibrillation at the request of Cristopher Peru. Presenting today for electrophysiology evaluation.  History of hypertension, PAF, acute on chronic diastolic heart failure, and history of GI bleeding.  He is currently anticoagulated and has had no additional bleeding.  He has been on dofetilide for approximately 1 year and had multiple episodes of atrial fibrillation in May.  He has had none since that time.  He is being referred for discussion of ablation.  His symptoms of atrial fibrillation or mild chest discomfort and shortness of breath.  Today, denies symptoms of palpitations, chest pain, shortness of breath, orthopnea, PND, lower extremity edema, claudication, dizziness, presyncope, syncope, bleeding, or neurologic sequela. The patient is tolerating medications without difficulties.  He done well after his cardiac catheterization.  He has had some atrial fibrillation, but this has been improved with as needed metoprolol.  Past Medical History:  Diagnosis Date  . Arthritis   . Atrial fibrillation, persistent ([place])   . Chronic sinusitis   . Coronary artery disease   . Depression   . Difficult intubation    was told with shoulder done 2006-alittle narrow  . Dysrhythmia    A. Fib  . 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 Hypertension   . Jejunostomy tube fell out    when asked about this in 04/2017, pt denied ever having a J tube, feeding tube, tubes placed post surgery so ??? veracity of a previous J tube.    . 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 Spastic colon   . 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 Stroke (cerebrum) ([place])   . SVT (supraventricular tachycardia) ([facility])    Past Surgical History:  Procedure Laterality Date  . CARDIAC CATHETERIZATION    . CATARACT EXTRACTION, BILATERAL    . COLONOSCOPY WITH PROPOFOL N/A 04/17/2017   Procedure: COLONOSCOPY WITH PROPOFOL;  Surgeon: Yetta Flock, MD;  Location: [facility];  Service: Gastroenterology;  Laterality: N/A;  . ESOPHAGEAL DILATION  2017  . ESOPHAGOGASTRODUODENOSCOPY (EGD) WITH PROPOFOL N/A 04/17/2017   Procedure: ESOPHAGOGASTRODUODENOSCOPY (EGD) WITH PROPOFOL;  Surgeon: Yetta Flock, MD;  Location: [facility];  Service: Gastroenterology;  Laterality: N/A;  . EYE SURGERY    . FOOT NEUROMA SURGERY  2002  . LEFT HEART CATH AND CORONARY ANGIOGRAPHY N/A 01/16/2019   Procedure: LEFT HEART CATH AND CORONARY ANGIOGRAPHY;  Surgeon: Jettie Booze, MD;  Location: [facility];  Service: Cardiovascular;  Laterality: N/A;  . LEFT HEART CATHETERIZATION WITH CORONARY ANGIOGRAM Right 06/14/2011   20% LM, chronic occluded mid LAD, 50% ostial LCX, 20% mid RI, RCA with collaterals to mid LAD, mid 10% stenosis, EF 60% 06/14/11  . NASAL SINUS SURGERY    . SHOULDER ARTHROSCOPY  03/01/2011   Procedure: ARTHROSCOPY SHOULDER;  Surgeon: Ninetta Lights, MD;  Location: [facility];  Service: Orthopedics;  Laterality: Right;  arthroscopy shoulder decompression subacromial partial acromioplasty with coracoacromial release, distal claviculectomy, debridement of labrium  . SHOULDER ARTHROSCOPY  03/01/2011   Procedure: ARTHROSCOPY SHOULDER;  Surgeon: Ninetta Lights, MD;  Location: [facility];  Service: Orthopedics;  Laterality: Right;  arthroscopy shoulder decompression subacromial partial acromioplasty with coracoacromial release, distal claviculectomy, debridement of labrium  . TEE WITHOUT CARDIOVERSION N/A 03/19/2016   Procedure: TRANSESOPHAGEAL ECHOCARDIOGRAM  (TEE);  Surgeon: Larey Dresser, MD;  Location: [facility];  Service: Cardiovascular;  Laterality: N/A;  . TOTAL KNEE ARTHROPLASTY Right 09/23/2013   Procedure: TOTAL KNEE ARTHROPLASTY;  Surgeon: Ninetta Lights, MD;  Location: [facility];  Service: Orthopedics;  Laterality: Right;  . TOTAL SHOULDER ARTHROPLASTY  06/28/2011   Procedure: TOTAL SHOULDER ARTHROPLASTY;  Surgeon: Ninetta Lights, MD;  Location: [facility];  Service: Orthopedics;  Laterality: Right;  . UVULOPALATOPHARYNGOPLASTY    . VIDEO ASSISTED THORACOSCOPY (VATS)/WEDGE RESECTION Right 06/02/2018   Procedure: VIDEO ASSISTED THORACOSCOPY (VATS)/WEDGE RESECTION with Lymph node disection and intercostal nerve block.;  Surgeon: [name], MD;  Location: [facility];  Service: Thoracic;  Laterality: Right;     Current Outpatient Medications  Medication Sig Dispense Refill  . acetaminophen (TYLENOL) 325 MG tablet Take 650 mg by mouth every 6 (six) hours as needed for moderate pain.    Marland Kitchen apixaban (ELIQUIS) 5 MG TABS tablet Take 1 tablet (5 mg total) by mouth 2 (two) times daily. 60 tablet 2  . Cholecalciferol (VITAMIN D3) 5000 units TABS Take 5,000 Units by mouth daily.    . Cyanocobalamin (VITAMIN B-12) 500 MCG SUBL Place 500 mcg under the tongue 3 (three) times a week.     . dofetilide (TIKOSYN) 250 MCG capsule TAKE 1 CAPSULE BY MOUTH TWO TIMES DAILY (Patient taking differently: Take 250 mcg by mouth 2 (two) times daily. ) 180 capsule 3  . ferrous sulfate (FEROSUL) 325 (65 FE) MG tablet Take 1 tablet (325 mg total) by mouth 2 (two) times daily with a meal.    . fluticasone (FLONASE) 50 MCG/ACT nasal spray Place 1-2 sprays into both nostrils daily as needed for allergies or rhinitis.    . furosemide (LASIX) 40 MG tablet TAKE 1 TABLET(40 MG) BY MOUTH DAILY (Patient taking differently: Take 40 mg by mouth 3 (three) times a week. ) 90 tablet 2  . irbesartan (AVAPRO) 150 MG tablet TAKE 1 TABLET(150 MG) BY MOUTH DAILY (Patient  taking differently: Take 150 mg by mouth daily. TAKE 1 TABLET(150 MG) BY MOUTH DAILY) 90 tablet 3  . magnesium oxide (MAG-OX) 400 MG tablet Take 400 mg by mouth daily.    . Melatonin 10 MG TABS Take 10 mg by mouth at bedtime as needed (sleep).     . metoprolol tartrate (LOPRESSOR) 25 MG tablet TAKE 1 TABLET BY MOUTH 2  TIMES DAILY. (Patient taking differently: Take 25 mg by mouth 2 (two) times daily. ) 180 tablet 2  . metoprolol tartrate (LOPRESSOR) 25 MG tablet Take 1-2 tablets (25-50 mg total) by mouth daily as needed (for breakthrough palpitations). 180 tablet 3  . Multiple Vitamins-Minerals (PRESERVISION AREDS 2 PO) Take 1 capsule by mouth 2 (two) times daily.    . pantoprazole (PROTONIX) 40 MG tablet Take 40 mg by mouth daily.     Marland Kitchen PARoxetine (PAXIL) 30 MG tablet Take 30 mg by mouth at bedtime.     . polycarbophil (FIBERCON) 625 MG tablet Take 1,250 mg by mouth at bedtime.     [name] Glycol-Propyl Glycol (SYSTANE OP) Place 2 drops into both eyes 2 (two) times daily as needed (dry eyes).     . potassium chloride (  K-DUR) 10 MEQ tablet TAKE 1 TABLET BY MOUTH EVERY DAY FOR 3 DAYS. THEN TAKE ONLY WHEN TAKING LASIX AS DIRECTED. (Patient taking differently: Take 10 mEq by mouth 3 (three) times a week. Takes with Lasix) 30 tablet 3  . rosuvastatin (CRESTOR) 5 MG tablet TAKE 1 TABLET BY MOUTH  EVERY MORNING (Patient taking differently: Take 5 mg by mouth daily. ) 90 tablet 3  . tamsulosin (FLOMAX) 0.4 MG CAPS capsule Take 0.4 mg by mouth at bedtime.   11   No current facility-administered medications for this visit.     Allergies:   Glucosamine-chondroitin, Tetanus toxoid, Fish oil, Ibuprofen, and Naproxen   Social History:  The patient  reports that he quit smoking about 20 years ago. His smoking use included cigarettes. He has a 60.00 pack-year smoking history. He has never used smokeless tobacco. He reports current alcohol use of about 14.0 standard drinks of alcohol per week. He reports that  he does not use drugs.   Family History:  The patient's family history includes Cancer in his mother; Heart attack in his father; Migraines in his brother.    ROS:  Please see the history of present illness.   Otherwise, review of systems is positive for none.   All other systems are reviewed and negative.   PHYSICAL EXAM: VS:  BP 112/72   Pulse 77   Ht 5\' 10"  (1.778 m)   Wt 213 lb (96.6 kg)   SpO2 95%   BMI 30.56 kg/m  , BMI Body mass index is 30.56 kg/m. GEN: Well nourished, well developed, in no acute distress  HEENT: normal  Neck: no JVD, carotid bruits, or masses Cardiac: RRR; no murmurs, rubs, or gallops,no edema  Respiratory:  clear to auscultation bilaterally, normal work of breathing GI: soft, nontender, nondistended, + BS MS: no deformity or atrophy  Skin: warm and dry Neuro:  Strength and sensation are intact Psych: euthymic mood, full affect  EKG:  EKG is not ordered today. Personal review of the ekg ordered 01/16/19 shows atrial fibrillation, rate 142  Recent Labs: 06/06/2018: Magnesium 1.9 01/02/2019: ALT 29 01/13/2019: BUN 21; Creatinine, Ser 1.06; Hemoglobin 13.9; Platelets 238; Potassium 4.6; Sodium 139    Lipid Panel  No results found for: CHOL, TRIG, HDL, CHOLHDL, VLDL, LDLCALC, LDLDIRECT   Wt Readings from Last 3 Encounters:  01/30/19 213 lb (96.6 kg)  01/16/19 210 lb (95.3 kg)  01/13/19 210 lb (95.3 kg)      Other studies Reviewed: Additional studies/ records that were reviewed today include: TTE 04/17/17  Review of the above records today demonstrates:  - Left ventricle: The cavity size was normal. Wall thickness was   increased in a pattern of moderate LVH. Systolic function was   normal. The estimated ejection fraction was in the range of 60%   to 65%. Wall motion was normal; there were no regional wall   motion abnormalities. Features are consistent with a pseudonormal   left ventricular filling pattern, with concomitant abnormal    relaxation and increased filling pressure (grade 2 diastolic   dysfunction). - Mitral valve: There was mild regurgitation. - Right ventricle: The cavity size was mildly dilated. Systolic   function was mildly reduced. - Tricuspid valve: There was mild-moderate regurgitation. - Pulmonary arteries: Systolic pressure was moderately increased.   PA peak pressure: 43 mm Hg (S).  Cath 01/16/19  Ost Cx to Prox Cx lesion is 50% stenosed.  Mid LM to Dist LM lesion is 25% stenosed.  Mid LAD lesion is 100% stenosed.  LV end diastolic pressure is normal.  There is no aortic valve stenosis.  ASSESSMENT AND PLAN:  1.  Paroxysmal atrial fibrillation: Currently on Xarelto and dofetilide.  Scheduled for AF ablation 02/11/2019.  Risks and benefits have been discussed.  Risk include bleeding, tamponade, heart block, stroke, damage surrounding organs.  He understands these risks and is agreed to the procedure.  This patients CHA2DS2-VASc Score and unadjusted Ischemic Stroke Rate (% per year) is equal to 9.7 % stroke rate/year from a score of 6  Above score calculated as 1 point each if present [CHF, HTN, DM, Vascular=MI/PAD/Aortic Plaque, Age if 65-74, or Male] Above score calculated as 2 points each if present [Age > 75, or Stroke/TIA/TE]   2.  Hyperlipidemia: Continue statin  3.  GI bleeding: Currently on Xarelto without evidence of GI bleeding.  4.  Diastolic heart failure: Attaining a low-salt diet without volume overload.    Current medicines are reviewed at length with the patient today.   The patient does not have concerns regarding his medicines.  The following changes were made today: None  Labs/ tests ordered today include:  Orders Placed This Encounter  Procedures  . Basic Metabolic Panel (BMET)  . CBC    Disposition:   FU with Camela Wich 3 months  Signed, Cerrone Debold Meredith Leeds, MD  01/30/2019 4:10 PM     [place] [address] [phone] (office) [phone] (fax)

## 2019-01-30 NOTE — Telephone Encounter (Signed)
New Message  Patient's wife is calling to see where patient will need to report to on 02/07/19 for his pre-procedure covid test since the site will be closed for the Thanksgiving Holiday. Please give patient/patient's wife a call back to confirm.

## 2019-01-30 NOTE — Telephone Encounter (Signed)
Pts wife advised that he is going to be tested at the [facility] location on 02/07/19.

## 2019-01-30 NOTE — Patient Instructions (Signed)
Medication Instructions:   *If you need a refill on your cardiac medications before your next appointment, please call your pharmacy*  Lab Work: Bmet, cbc  If you have labs (blood work) drawn today and your tests are completely normal, you will receive your results only by: Marland Kitchen MyChart Message (if you have MyChart) OR . A paper copy in the mail If you have any lab test that is abnormal or we need to change your treatment, we will call you to review the results.  Testing/Procedures:   Follow-Up: At [facility], you and your health needs are our priority.  As part of our continuing mission to provide you with exceptional heart care, we have created designated Provider Care Teams.  These Care Teams include your primary Cardiologist (physician) and Advanced Practice Providers (APPs -  Physician Assistants and Nurse Practitioners) who all work together to provide you with the care you need, when you need it.  Your next appointment:   1 year(s)  The format for your next appointment:   In Person  Provider:   Allegra Lai, MD  Other Instructions

## 2019-01-31 LAB — BASIC METABOLIC PANEL
BUN/Creatinine Ratio: 24 (ref 10–24)
BUN: 22 mg/dL (ref 8–27)
CO2: 26 mmol/L (ref 20–29)
Calcium: 9.5 mg/dL (ref 8.6–10.2)
Chloride: 101 mmol/L (ref 96–106)
Creatinine, Ser: 0.93 mg/dL (ref 0.76–1.27)
GFR calc Af Amer: 91 mL/min/{1.73_m2} (ref >59)
GFR calc non Af Amer: 78 mL/min/{1.73_m2} (ref >59)
Glucose: 125 mg/dL — ABNORMAL HIGH (ref 65–99)
Potassium: 4.8 mmol/L (ref 3.5–5.2)
Sodium: 140 mmol/L (ref 134–144)

## 2019-01-31 LAB — CBC
Hematocrit: 41.6 % (ref 37.5–51.0)
Hemoglobin: 14.2 g/dL (ref 13.0–17.7)
MCH: 31.3 pg (ref 26.6–33.0)
MCHC: 34.1 g/dL (ref 31.5–35.7)
MCV: 92 fL (ref 79–97)
Platelets: 292 10*3/uL (ref 150–450)
RBC: 4.54 x10E6/uL (ref 4.14–5.80)
RDW: 11.7 % (ref 11.6–15.4)
WBC: 11.4 10*3/uL — ABNORMAL HIGH (ref 3.4–10.8)

## 2019-02-07 ENCOUNTER — Other Ambulatory Visit (HOSPITAL_COMMUNITY)
Admission: RE | Admit: 2019-02-07 | Discharge: 2019-02-07 | Disposition: A | Discharge status: Home / Self Care | Payer: Medicare Other | Source: Ambulatory Visit | Attending: Cardiology | Admitting: Cardiology

## 2019-02-07 DIAGNOSIS — Z01812 Encounter for preprocedural laboratory examination: Secondary | ICD-10-CM | POA: Diagnosis present

## 2019-02-07 DIAGNOSIS — Z20828 Contact with and (suspected) exposure to other viral communicable diseases: Secondary | ICD-10-CM | POA: Insufficient documentation

## 2019-02-08 LAB — NOVEL CORONAVIRUS, NAA (HOSP ORDER, SEND-OUT TO REF LAB; TAT 18-24 HRS): SARS-CoV-2, NAA: NOT DETECTED

## 2019-02-11 ENCOUNTER — Ambulatory Visit (HOSPITAL_COMMUNITY): Payer: Medicare Other | Admitting: Certified Registered"

## 2019-02-11 ENCOUNTER — Other Ambulatory Visit: Payer: Self-pay

## 2019-02-11 ENCOUNTER — Encounter (HOSPITAL_COMMUNITY)
Admission: RE | Disposition: A | Discharge status: Home / Self Care | Payer: Self-pay | Source: Home / Self Care | Attending: Cardiology

## 2019-02-11 ENCOUNTER — Ambulatory Visit (HOSPITAL_BASED_OUTPATIENT_CLINIC_OR_DEPARTMENT_OTHER)
Admission: RE | Admit: 2019-02-11 | Discharge: 2019-02-12 | Disposition: A | Discharge status: Home / Self Care | Payer: Medicare Other | Source: Home / Self Care | Attending: Cardiology | Admitting: Cardiology

## 2019-02-11 DIAGNOSIS — I480 Paroxysmal atrial fibrillation: Secondary | ICD-10-CM | POA: Insufficient documentation

## 2019-02-11 DIAGNOSIS — Z7901 Long term (current) use of anticoagulants: Secondary | ICD-10-CM | POA: Insufficient documentation

## 2019-02-11 DIAGNOSIS — I471 Supraventricular tachycardia: Secondary | ICD-10-CM | POA: Insufficient documentation

## 2019-02-11 DIAGNOSIS — C3490 Malignant neoplasm of unspecified part of unspecified bronchus or lung: Secondary | ICD-10-CM | POA: Diagnosis not present

## 2019-02-11 DIAGNOSIS — I110 Hypertensive heart disease with heart failure: Secondary | ICD-10-CM | POA: Insufficient documentation

## 2019-02-11 DIAGNOSIS — I4819 Other persistent atrial fibrillation: Secondary | ICD-10-CM | POA: Diagnosis not present

## 2019-02-11 DIAGNOSIS — I5033 Acute on chronic diastolic (congestive) heart failure: Secondary | ICD-10-CM | POA: Insufficient documentation

## 2019-02-11 DIAGNOSIS — I959 Hypotension, unspecified: Secondary | ICD-10-CM | POA: Insufficient documentation

## 2019-02-11 DIAGNOSIS — J9601 Acute respiratory failure with hypoxia: Secondary | ICD-10-CM | POA: Diagnosis not present

## 2019-02-11 DIAGNOSIS — Z79899 Other long term (current) drug therapy: Secondary | ICD-10-CM | POA: Insufficient documentation

## 2019-02-11 HISTORY — PX: ATRIAL FIBRILLATION ABLATION: EP1191

## 2019-02-11 LAB — POCT ACTIVATED CLOTTING TIME
Activated Clotting Time: 257 seconds
Activated Clotting Time: 263 seconds
Activated Clotting Time: 301 seconds
Activated Clotting Time: 301 seconds
Activated Clotting Time: 279 seconds

## 2019-02-11 SURGERY — ATRIAL FIBRILLATION ABLATION
Anesthesia: General

## 2019-02-11 MED ORDER — ACETAMINOPHEN 325 MG PO TABS
650.0000 mg | ORAL_TABLET | ORAL | Status: DC | PRN
  Filled 2019-02-11: qty 2

## 2019-02-11 MED ORDER — MIDAZOLAM HCL 5 MG/5ML IJ SOLN
INTRAMUSCULAR | Status: DC | PRN
  Administered 2019-02-11 (×2): 1 mg via INTRAVENOUS

## 2019-02-11 MED ORDER — TAMSULOSIN HCL 0.4 MG PO CAPS
0.4000 mg | ORAL_CAPSULE | Freq: Every day | ORAL | Status: DC
  Administered 2019-02-11: 0.4 mg via ORAL
  Filled 2019-02-11: qty 1

## 2019-02-11 MED ORDER — ALBUMIN HUMAN 5 % IV SOLN
12.5000 g | Freq: Once | INTRAVENOUS | Status: AC
  Administered 2019-02-11: 12.5 g via INTRAVENOUS
  Filled 2019-02-11 (×2): qty 250

## 2019-02-11 MED ORDER — ACETAMINOPHEN 325 MG PO TABS
650.0000 mg | ORAL_TABLET | Freq: Four times a day (QID) | ORAL | Status: DC | PRN
  Administered 2019-02-11: 650 mg via ORAL
  Filled 2019-02-11: qty 2

## 2019-02-11 MED ORDER — VASOPRESSIN 20 UNIT/ML IV SOLN
INTRAVENOUS | Status: AC
  Filled 2019-02-11: qty 2

## 2019-02-11 MED ORDER — FENTANYL CITRATE (PF) 100 MCG/2ML IJ SOLN: 25.0000 ug | INTRAMUSCULAR | Status: DC | PRN

## 2019-02-11 MED ORDER — DEXAMETHASONE SODIUM PHOSPHATE 10 MG/ML IJ SOLN
INTRAMUSCULAR | Status: DC | PRN
  Administered 2019-02-11: 4 mg via INTRAVENOUS

## 2019-02-11 MED ORDER — HEPARIN SODIUM (PORCINE) 1000 UNIT/ML IJ SOLN
INTRAMUSCULAR | Status: DC | PRN
  Administered 2019-02-11: 6000 [IU] via INTRAVENOUS
  Administered 2019-02-11: 3000 [IU] via INTRAVENOUS
  Administered 2019-02-11: 14000 [IU] via INTRAVENOUS
  Administered 2019-02-11: 4000 [IU] via INTRAVENOUS
  Administered 2019-02-11: 3000 [IU] via INTRAVENOUS

## 2019-02-11 MED ORDER — PHENYLEPHRINE HCL-NACL 10-0.9 MG/250ML-% IV SOLN
INTRAVENOUS | Status: DC | PRN
  Administered 2019-02-11: 14:00:00 via INTRAVENOUS
  Administered 2019-02-11: 60 ug/min via INTRAVENOUS

## 2019-02-11 MED ORDER — HEPARIN (PORCINE) IN NACL 2-0.9 UNITS/ML
INTRAMUSCULAR | Status: AC | PRN
  Administered 2019-02-11 (×5): 500 mL

## 2019-02-11 MED ORDER — FENTANYL CITRATE (PF) 100 MCG/2ML IJ SOLN
INTRAMUSCULAR | Status: DC | PRN
  Administered 2019-02-11: 25 ug via INTRAVENOUS
  Administered 2019-02-11: 50 ug via INTRAVENOUS

## 2019-02-11 MED ORDER — APIXABAN 5 MG PO TABS
5.0000 mg | ORAL_TABLET | Freq: Two times a day (BID) | ORAL | Status: DC
  Administered 2019-02-11 – 2019-02-12 (×2): 5 mg via ORAL
  Filled 2019-02-11 (×2): qty 1

## 2019-02-11 MED ORDER — MELATONIN 3 MG PO TABS
9.0000 mg | ORAL_TABLET | Freq: Every evening | ORAL | Status: DC | PRN
  Administered 2019-02-12: 9 mg via ORAL
  Filled 2019-02-11 (×2): qty 3

## 2019-02-11 MED ORDER — BUPIVACAINE HCL (PF) 0.25 % IJ SOLN
INTRAMUSCULAR | Status: DC | PRN
  Administered 2019-02-11: 60 mL

## 2019-02-11 MED ORDER — ONDANSETRON HCL 4 MG/2ML IJ SOLN
INTRAMUSCULAR | Status: DC | PRN
  Administered 2019-02-11: 4 mg via INTRAVENOUS

## 2019-02-11 MED ORDER — ALBUMIN HUMAN 5 % IV SOLN
INTRAVENOUS | Status: DC | PRN
  Administered 2019-02-11 (×2): via INTRAVENOUS

## 2019-02-11 MED ORDER — FERROUS SULFATE 325 (65 FE) MG PO TABS
325.0000 mg | ORAL_TABLET | Freq: Two times a day (BID) | ORAL | Status: DC
  Administered 2019-02-12: 325 mg via ORAL
  Filled 2019-02-11: qty 1

## 2019-02-11 MED ORDER — ONDANSETRON HCL 4 MG/2ML IJ SOLN: 4.0000 mg | Freq: Four times a day (QID) | INTRAMUSCULAR | Status: DC | PRN

## 2019-02-11 MED ORDER — ROCURONIUM BROMIDE 50 MG/5ML IV SOSY
PREFILLED_SYRINGE | INTRAVENOUS | Status: DC | PRN
  Administered 2019-02-11: 30 mg via INTRAVENOUS

## 2019-02-11 MED ORDER — LIDOCAINE 2% (20 MG/ML) 5 ML SYRINGE
INTRAMUSCULAR | Status: DC | PRN
  Administered 2019-02-11: 100 mg via INTRAVENOUS

## 2019-02-11 MED ORDER — SUGAMMADEX SODIUM 200 MG/2ML IV SOLN
INTRAVENOUS | Status: DC | PRN
  Administered 2019-02-11: 192.4 mg via INTRAVENOUS

## 2019-02-11 MED ORDER — SODIUM CHLORIDE 0.9 % IV SOLN: 250.0000 mL | INTRAVENOUS | Status: DC | PRN

## 2019-02-11 MED ORDER — VITAMIN D 25 MCG (1000 UNIT) PO TABS
5000.0000 [IU] | ORAL_TABLET | Freq: Every day | ORAL | Status: DC
  Administered 2019-02-12: 5000 [IU] via ORAL
  Filled 2019-02-11: qty 5

## 2019-02-11 MED ORDER — PROPOFOL 10 MG/ML IV BOLUS
INTRAVENOUS | Status: DC | PRN
  Administered 2019-02-11: 150 mg via INTRAVENOUS
  Administered 2019-02-11: 30 mg via INTRAVENOUS

## 2019-02-11 MED ORDER — SODIUM CHLORIDE 0.9% FLUSH
3.0000 mL | Freq: Two times a day (BID) | INTRAVENOUS | Status: DC
  Administered 2019-02-11: 3 mL via INTRAVENOUS

## 2019-02-11 MED ORDER — SODIUM CHLORIDE 0.9% FLUSH: 3.0000 mL | INTRAVENOUS | Status: DC | PRN

## 2019-02-11 MED ORDER — CALCIUM POLYCARBOPHIL 625 MG PO TABS
1250.0000 mg | ORAL_TABLET | Freq: Every day | ORAL | Status: DC
  Filled 2019-02-11 (×2): qty 2

## 2019-02-11 MED ORDER — VASOPRESSIN 20 UNIT/ML IV SOLN
INTRAVENOUS | Status: DC | PRN
  Administered 2019-02-11 (×6): 1 [IU] via INTRAVENOUS

## 2019-02-11 MED ORDER — FLUTICASONE PROPIONATE 50 MCG/ACT NA SUSP
1.0000 | Freq: Every day | NASAL | Status: DC | PRN
  Filled 2019-02-11: qty 16

## 2019-02-11 MED ORDER — PANTOPRAZOLE SODIUM 40 MG PO TBEC
40.0000 mg | DELAYED_RELEASE_TABLET | Freq: Every day | ORAL | Status: DC
  Administered 2019-02-12: 40 mg via ORAL
  Filled 2019-02-11: qty 1

## 2019-02-11 MED ORDER — PHENYLEPHRINE 40 MCG/ML (10ML) SYRINGE FOR IV PUSH (FOR BLOOD PRESSURE SUPPORT)
PREFILLED_SYRINGE | INTRAVENOUS | Status: DC | PRN
  Administered 2019-02-11 (×2): 80 ug via INTRAVENOUS
  Administered 2019-02-11: 40 ug via INTRAVENOUS
  Administered 2019-02-11: 80 ug via INTRAVENOUS
  Administered 2019-02-11: 120 ug via INTRAVENOUS

## 2019-02-11 MED ORDER — PAROXETINE HCL 30 MG PO TABS
30.0000 mg | ORAL_TABLET | Freq: Every day | ORAL | Status: DC
  Administered 2019-02-12: 30 mg via ORAL
  Filled 2019-02-11 (×2): qty 1

## 2019-02-11 MED ORDER — SODIUM CHLORIDE 0.9 % IV SOLN
Freq: Once | INTRAVENOUS | Status: AC
  Administered 2019-02-11: 19:00:00 via INTRAVENOUS

## 2019-02-11 MED ORDER — MAGNESIUM OXIDE 400 (241.3 MG) MG PO TABS
400.0000 mg | ORAL_TABLET | Freq: Every day | ORAL | Status: DC
  Administered 2019-02-12: 400 mg via ORAL
  Filled 2019-02-11 (×2): qty 1

## 2019-02-11 MED ORDER — DOFETILIDE 250 MCG PO CAPS
250.0000 ug | ORAL_CAPSULE | Freq: Two times a day (BID) | ORAL | Status: DC
  Administered 2019-02-11 – 2019-02-12 (×2): 250 ug via ORAL
  Filled 2019-02-11 (×2): qty 1

## 2019-02-11 MED ORDER — HEPARIN SODIUM (PORCINE) 1000 UNIT/ML IJ SOLN
INTRAMUSCULAR | Status: AC
  Filled 2019-02-11: qty 1

## 2019-02-11 MED ORDER — SODIUM CHLORIDE 0.9 % IV SOLN
INTRAVENOUS | Status: DC
  Administered 2019-02-11 (×2): via INTRAVENOUS

## 2019-02-11 MED ORDER — EPHEDRINE SULFATE-NACL 50-0.9 MG/10ML-% IV SOSY
PREFILLED_SYRINGE | INTRAVENOUS | Status: DC | PRN
  Administered 2019-02-11 (×2): 10 mg via INTRAVENOUS
  Administered 2019-02-11: 5 mg via INTRAVENOUS

## 2019-02-11 MED ORDER — HEPARIN SODIUM (PORCINE) 1000 UNIT/ML IJ SOLN
INTRAMUSCULAR | Status: DC | PRN
  Administered 2019-02-11: 1000 [IU] via INTRAVENOUS

## 2019-02-11 MED ORDER — PROTAMINE SULFATE 10 MG/ML IV SOLN
INTRAVENOUS | Status: DC | PRN
  Administered 2019-02-11 (×5): 10 mg via INTRAVENOUS

## 2019-02-11 MED ORDER — VITAMIN B-12 500 MCG SL SUBL: 500.0000 ug | SUBLINGUAL_TABLET | SUBLINGUAL | Status: DC

## 2019-02-11 MED ORDER — METOPROLOL TARTRATE 25 MG PO TABS: 25.0000 mg | ORAL_TABLET | Freq: Once | ORAL | Status: DC

## 2019-02-11 MED ORDER — SUCCINYLCHOLINE CHLORIDE 200 MG/10ML IV SOSY
PREFILLED_SYRINGE | INTRAVENOUS | Status: DC | PRN
  Administered 2019-02-11: 120 mg via INTRAVENOUS

## 2019-02-11 MED ORDER — VASOPRESSIN 20 UNIT/ML IV SOLN
INTRAVENOUS | Status: AC
  Filled 2019-02-11: qty 1

## 2019-02-11 MED ORDER — BUPIVACAINE HCL (PF) 0.25 % IJ SOLN
INTRAMUSCULAR | Status: AC
  Filled 2019-02-11: qty 60

## 2019-02-11 SURGICAL SUPPLY — 22 items
BLANKET WARM UNDERBOD FULL ACC (MISCELLANEOUS) ×3 IMPLANT
CATH MAPPNG PENTARAY F 2-6-2MM (CATHETERS) IMPLANT
CATH SMTCH THERMOCOOL SF DF (CATHETERS) ×2 IMPLANT
CATH SOUNDSTAR ECO REPROCESSED (CATHETERS) ×2 IMPLANT
CATH WEBSTER BI DIR CS D-F CRV (CATHETERS) ×2 IMPLANT
COVER SWIFTLINK CONNECTOR (BAG) ×3 IMPLANT
DEVICE CLOSURE PERCLS PRGLD 6F (VASCULAR PRODUCTS) IMPLANT
PACK EP LATEX FREE (CUSTOM PROCEDURE TRAY) ×3
PACK EP LF (CUSTOM PROCEDURE TRAY) ×1 IMPLANT
PAD PRO RADIOLUCENT 2001M-C (PAD) ×3 IMPLANT
PATCH CARTO3 (PAD) ×2 IMPLANT
PENTARAY F 2-6-2MM (CATHETERS) ×3
PERCLOSE PROGLIDE 6F (VASCULAR PRODUCTS) ×12
SHEATH AVANTI 11F 11CM (SHEATH) ×2 IMPLANT
SHEATH BAYLIS SUREFLEX  M 8.5 (SHEATH) ×2
SHEATH BAYLIS SUREFLEX M 8.5 (SHEATH) IMPLANT
SHEATH BAYLIS TRANSSEPTAL 98CM (NEEDLE) ×2 IMPLANT
SHEATH CARTO VIZIGO SM CVD (SHEATH) ×2 IMPLANT
SHEATH PINNACLE 7F 10CM (SHEATH) ×2 IMPLANT
SHEATH PINNACLE 8F 10CM (SHEATH) ×4 IMPLANT
SHEATH PROBE COVER 6X72 (BAG) ×2 IMPLANT
TUBING SMART ABLATE COOLFLOW (TUBING) ×2 IMPLANT

## 2019-02-11 NOTE — Anesthesia Postprocedure Evaluation (Signed)
Anesthesia Post Note  Patient: George Vega  Procedure(s) Performed: ATRIAL FIBRILLATION ABLATION (N/A )     Patient location during evaluation: PACU Anesthesia Type: General Level of consciousness: awake Pain management: pain level controlled Vital Signs Assessment: post-procedure vital signs reviewed and stable Respiratory status: spontaneous breathing Cardiovascular status: stable Postop Assessment: no apparent nausea or vomiting Anesthetic complications: no    Last Vitals:  Vitals:   02/11/19 1635 02/11/19 1650  BP: (!) 87/53 (!) 115/96  Pulse: 92 93  Resp: (!) 25 (!) 25  Temp:  (!) 36.2 C  SpO2: 96% 95%    Last Pain:  Vitals:   02/11/19 1650  TempSrc: Temporal  PainSc:                  Dinah Lupa

## 2019-02-11 NOTE — Anesthesia Procedure Notes (Addendum)
Procedure Name: Intubation Date/Time: 02/11/2019 12:35 PM Performed by: Orlie Dakin, CRNA Pre-anesthesia Checklist: Patient identified, Emergency Drugs available, Suction available and Patient being monitored Patient Re-evaluated:Patient Re-evaluated prior to induction Oxygen Delivery Method: Circle system utilized Preoxygenation: Pre-oxygenation with 100% oxygen Induction Type: IV induction Laryngoscope Size: Glidescope and 4 Grade View: Grade I Tube type: Oral Tube size: 7.5 mm Number of attempts: 1 Airway Equipment and Method: Stylet and Video-laryngoscopy Placement Confirmation: ETT inserted through vocal cords under direct vision,  positive ETCO2 and breath sounds checked- equal and bilateral Secured at: 24 cm Tube secured with: Tape Dental Injury: Teeth and Oropharynx as per pre-operative assessment  Comments: Glidescope used due to H/O "difficult intubation", and anterior anatomy.  Small chin, short neck noted.  4x4s bite block used.

## 2019-02-11 NOTE — Progress Notes (Signed)
Patient s/p afib ablation arrived to unit with sob and HR 130-140s. Placed patient on 2L of 02 and got EKG (SVT). Per Dr Curt Bears, give Tikosyn dose tonight.   Reassessment: patient stated feeling much better and was not short of breath. Still in svt 120-130s, paged cardiology, got order for metoprolol 25mg  x1. Will continue to monitor patient.

## 2019-02-11 NOTE — Anesthesia Preprocedure Evaluation (Addendum)
Anesthesia Evaluation  Patient identified by MRN, date of birth, ID band Patient awake    Reviewed: Allergy & Precautions, NPO status , Patient's Chart, lab work & pertinent test results  History of Anesthesia Complications (+) DIFFICULT AIRWAY  Airway Mallampati: II  TM Distance: >3 FB     Dental   Pulmonary former smoker,    breath sounds clear to auscultation       Cardiovascular hypertension, + CAD  + dysrhythmias  Rhythm:Irregular Rate:Normal     Neuro/Psych    GI/Hepatic negative GI ROS, Neg liver ROS,   Endo/Other  negative endocrine ROS  Renal/GU negative Renal ROS     Musculoskeletal  (+) Arthritis ,   Abdominal   Peds  Hematology  (+) anemia ,   Anesthesia Other Findings   Reproductive/Obstetrics                             Anesthesia Physical Anesthesia Plan  ASA: III  Anesthesia Plan: General   Post-op Pain Management:    Induction: Intravenous  PONV Risk Score and Plan: 2 and Ondansetron and Midazolam  Airway Management Planned: Oral ETT  Additional Equipment:   Intra-op Plan:   Post-operative Plan: Possible Post-op intubation/ventilation  Informed Consent: I have reviewed the patients History and Physical, chart, labs and discussed the procedure including the risks, benefits and alternatives for the proposed anesthesia with the patient or authorized representative who has indicated his/her understanding and acceptance.     Dental advisory given  Plan Discussed with: CRNA and Anesthesiologist  Anesthesia Plan Comments:         Anesthesia Quick Evaluation

## 2019-02-11 NOTE — Progress Notes (Signed)
HR slowed down a little to 107 ST . BP stable.

## 2019-02-11 NOTE — Progress Notes (Signed)
As patient was voiding, HR went from 80's to 130's SVT. Dr. Curt Bears paged and made aware. Patient asymtomatic. 12 lead EKG done per order

## 2019-02-11 NOTE — H&P (Signed)
George Vega has presented today for surgery, with the diagnosis of atrial fibrillation.  The various methods of treatment have been discussed with the patient and family. After consideration of risks, benefits and other options for treatment, the patient has consented to  Procedure(s): Catheter ablation as a surgical intervention .  Risks include but not limited to bleeding, tamponade, heart block, stroke, damage to surrounding organs, among others. The patient's history has been reviewed, patient examined, no change in status, stable for surgery.  I have reviewed the patient's chart and labs.  Questions were answered to the patient's satisfaction.    Will Curt Bears, MD 02/11/2019 11:46 AM

## 2019-02-11 NOTE — Progress Notes (Signed)
Patients BP took at 15 min interval and BP from 115/96 to unable to assess. I repositioned cuff and got 71/38 @ 1712. I got a larger cuff and tried on opposite arm. I got 71/36. Page was placed to Dr Curt Bears at [phone]. Patient is alert and oriented. Color is good. Drinking water and holding conversation. I made MD aware of BP drop and he told me to call anesthesia.  [facility] Desk called and Control and instrumentation engineer at bedside.  DR Camnitz at bedside to do bedside echo. Dr Nyoka Cowden to bedside. Albumin ordered and 1 liter saline. Dr Nyoka Cowden also requested additional iv placed.  22 g IV in right hand placed by k Lawerence Dery rn.

## 2019-02-11 NOTE — Transfer of Care (Signed)
Immediate Anesthesia Transfer of Care Note  Patient: George Vega  Procedure(s) Performed: ATRIAL FIBRILLATION ABLATION (N/A )  Patient Location: PACU  Anesthesia Type:General  Level of Consciousness: awake, alert  and oriented  Airway & Oxygen Therapy: Patient Spontanous Breathing and Patient connected to nasal cannula oxygen  Post-op Assessment: Report given to RN and Post -op Vital signs reviewed and stable  Post vital signs: Reviewed and stable  Last Vitals:  Vitals Value Taken Time  BP 87/53 02/11/19 1635  Temp    Pulse 91 02/11/19 1641  Resp 23 02/11/19 1641  SpO2 94 % 02/11/19 1641  Vitals shown include unvalidated device data.  Last Pain:  Vitals:   02/11/19 1613  TempSrc: Temporal  PainSc: 0-No pain         Complications: No apparent anesthesia complications

## 2019-02-12 ENCOUNTER — Encounter (HOSPITAL_COMMUNITY): Payer: Self-pay | Admitting: Cardiology

## 2019-02-12 ENCOUNTER — Ambulatory Visit (HOSPITAL_COMMUNITY): Payer: Medicare Other | Admitting: Nurse Practitioner

## 2019-02-12 DIAGNOSIS — I480 Paroxysmal atrial fibrillation: Secondary | ICD-10-CM | POA: Diagnosis not present

## 2019-02-12 LAB — BASIC METABOLIC PANEL
Anion gap: 10 (ref 5–15)
BUN: 16 mg/dL (ref 8–23)
CO2: 22 mmol/L (ref 22–32)
Calcium: 8.4 mg/dL — ABNORMAL LOW (ref 8.9–10.3)
Chloride: 107 mmol/L (ref 98–111)
Creatinine, Ser: 1.03 mg/dL (ref 0.61–1.24)
GFR calc Af Amer: >60 mL/min (ref >60)
GFR calc non Af Amer: >60 mL/min (ref >60)
Glucose, Bld: 135 mg/dL — ABNORMAL HIGH (ref 70–99)
Potassium: 4 mmol/L (ref 3.5–5.1)
Sodium: 139 mmol/L (ref 135–145)

## 2019-02-12 LAB — MAGNESIUM: Magnesium: 1.8 mg/dL (ref 1.7–2.4)

## 2019-02-12 MED ORDER — FUROSEMIDE 10 MG/ML IJ SOLN
40.0000 mg | Freq: Once | INTRAMUSCULAR | Status: AC
  Administered 2019-02-12: 40 mg via INTRAVENOUS
  Filled 2019-02-12: qty 4

## 2019-02-12 MED ORDER — APIXABAN 5 MG PO TABS: 5.0000 mg | ORAL_TABLET | Freq: Two times a day (BID) | ORAL | 0 refills | Status: DC

## 2019-02-12 MED FILL — ELIQUIS 5 MG TABLET: 5 | 30 days supply | Qty: 60 | Fill #0

## 2019-02-12 NOTE — Discharge Instructions (Signed)

## 2019-02-12 NOTE — Discharge Summary (Addendum)
ELECTROPHYSIOLOGY PROCEDURE DISCHARGE SUMMARY    Patient ID: George Vega,  MRN: [id number], DOB/AGE: Jul 20, 1940 78 y.o.  Admit date: 02/11/2019 Discharge date: 02/12/2019  Primary Care Physician: [name], MD  Primary Cardiologist: Cristopher Peru, MD  Electrophysiologist: Dr. Curt Bears  Primary Discharge Diagnosis:  Paroxysmal Atrial Fibrillation  Secondary Discharge Diagnosis:  Supraventricular tachycardia Acute on chronic diastolic CHF Hypotension  Procedures This Admission:  1.  Electrophysiology study and radiofrequency catheter ablation of Atrial Fibrillation on 02/11/2019 by Dr. Curt Bears.   This study demonstrated; i. Sinus rhythm upon presentation.   ii. Successful electrical isolation and anatomical encircling of the left pulmonary veins with radiofrequency current. Unsuccessful ablation of the right pulmonary veins. iii. No early apparent complications.  Brief HPI: George Vega is a 78 y.o. male with a history of persistent Atrial Fibrillation.  They have failed medical therapy with tikosyn. Risks, benefits, and alternatives to catheter ablation of Atrial Fibrillation were reviewed with the patient who wished to proceed.  The patient did not require TEE prior to the procedure as he was on appropriately dosed Eliquis without missing any doses.   Hospital Course:  The patient was admitted and underwent EPS/RFCA of Atrial Fibrillation with details as outlined above.  They were monitored on telemetry overnight which demonstrated SVT that broke around 2230. Pt developed hypotension in the setting of his SVT, and was given ~ 3 L of IVF. Given 40 mg IV lasix am of discharge. Groin was without complication on the day of discharge.  The patient was examined and considered to be stable for discharge after diuresing with IV lasix. Wound care and restrictions were reviewed with the patient.  The patient [name] be seen back by Roderic Palau, NP in 4 weeks and Dr. Curt Bears in 12  weeks for post ablation follow up.   Pt did not receive 30 day Eliquis card, so a 30 day supply was sent to [facility] per RN request. Re-stressed importance of not missing OAC 3 months post op.   This patients CHA2DS2-VASc Score and unadjusted Ischemic Stroke Rate (% per year) is equal to 11.2 % stroke rate/year from a score of 7 Above score calculated as 1 point each if present [CHF, HTN, DM, Vascular=MI/PAD/Aortic Plaque, Age if 65-74, or Male] Above score calculated as 2 points each if present [Age > 75, or Stroke/TIA/TE]      Physical Exam: Vitals:   02/11/19 2058 02/12/19 0100 02/12/19 0554 02/12/19 0609  BP: 123/79  105/71   Pulse: (!) 139 82 73   Resp: (!) 23 20    Temp: 98.6 F (37 C)   (!) 97.5 F (36.4 C)  TempSrc: Oral   Oral  SpO2: 97% 95% 98%   Weight:   99.7 kg   Height: 5\' 10"  (1.778 m)       GEN- The patient is well appearing, alert and oriented x 3 today.   HEENT: normocephalic, atraumatic; sclera clear, conjunctiva pink; hearing intact; oropharynx clear; neck supple  Lungs- Clear to ausculation bilaterally, normal work of breathing.  No wheezes, rales, rhonchi Heart- Regular rate and rhythm, no murmurs, rubs or gallops  GI- soft, non-tender, non-distended, bowel sounds present  Extremities- no clubbing, cyanosis, or edema; DP/PT/radial pulses 2+ bilaterally, groin without hematoma/bruit MS- no significant deformity or atrophy Skin- warm and dry, no rash or lesion Psych- euthymic mood, full affect Neuro- strength and sensation are intact   Labs:   Lab Results  Component Value Date   WBC  11.4 (H) 01/30/2019   HGB 14.2 01/30/2019   HCT 41.6 01/30/2019   MCV 92 01/30/2019   PLT 292 01/30/2019    Recent Labs  Lab 02/12/19 0731  NA 139  K 4.0  CL 107  CO2 22  BUN 16  CREATININE 1.03  CALCIUM 8.4*  GLUCOSE 135*     Discharge Medications:  Allergies as of 02/12/2019      Reactions   Glucosamine-chondroitin Anaphylaxis, Other (See Comments)    Stomach cramps, can't eat   Tetanus Toxoid Anaphylaxis, Hives   hives/throat swells shut   Fish Oil Other (See Comments)   Stomach cramps, can't eat   Ibuprofen Nausea Only, Other (See Comments)   Stomach pains   Naproxen Diarrhea      Medication List    TAKE these medications   acetaminophen 325 MG tablet Commonly known as: TYLENOL Take 650 mg by mouth every 6 (six) hours as needed for moderate pain.   apixaban 5 MG Tabs tablet Commonly known as: Eliquis Take 1 tablet (5 mg total) by mouth 2 (two) times daily.   dofetilide 250 MCG capsule Commonly known as: TIKOSYN TAKE 1 CAPSULE BY MOUTH TWO TIMES DAILY What changed: See the new instructions.   ferrous sulfate 325 (65 FE) MG tablet Commonly known as: FeroSul Take 1 tablet (325 mg total) by mouth 2 (two) times daily with a meal.   fluticasone 50 MCG/ACT nasal spray Commonly known as: FLONASE Place 1-2 sprays into both nostrils daily as needed for allergies or rhinitis.   furosemide 40 MG tablet Commonly known as: LASIX TAKE 1 TABLET(40 MG) BY MOUTH DAILY What changed: See the new instructions.   irbesartan 150 MG tablet Commonly known as: AVAPRO TAKE 1 TABLET(150 MG) BY MOUTH DAILY What changed: See the new instructions.   magnesium oxide 400 MG tablet Commonly known as: MAG-OX Take 400 mg by mouth daily.   Melatonin 10 MG Tabs Take 10 mg by mouth at bedtime as needed (sleep).   metoprolol tartrate 25 MG tablet Commonly known as: LOPRESSOR TAKE 1 TABLET BY MOUTH 2  TIMES DAILY.   metoprolol tartrate 25 MG tablet Commonly known as: LOPRESSOR Take 1-2 tablets (25-50 mg total) by mouth daily as needed (for breakthrough palpitations).   pantoprazole 40 MG tablet Commonly known as: PROTONIX Take 40 mg by mouth daily.   PARoxetine 30 MG tablet Commonly known as: PAXIL Take 30 mg by mouth at bedtime.   polycarbophil 625 MG tablet Commonly known as: FIBERCON Take 1,250 mg by mouth at bedtime.    potassium chloride 10 MEQ tablet Commonly known as: KLOR-CON TAKE 1 TABLET BY MOUTH EVERY DAY FOR 3 DAYS. THEN TAKE ONLY WHEN TAKING LASIX AS DIRECTED. What changed: See the new instructions.   PRESERVISION AREDS 2 PO Take 1 capsule by mouth 2 (two) times daily.   rosuvastatin 5 MG tablet Commonly known as: CRESTOR TAKE 1 TABLET BY MOUTH  EVERY MORNING What changed: when to take this   SYSTANE OP Place 2 drops into both eyes 2 (two) times daily as needed (dry eyes).   tamsulosin 0.4 MG Caps capsule Commonly known as: FLOMAX Take 0.4 mg by mouth at bedtime.   Vitamin B-12 500 MCG Subl Place 500 mcg under the tongue 3 (three) times a week.   Vitamin D3 125 MCG (5000 UT) Tabs Take 5,000 Units by mouth daily.       Disposition:   Follow-up Information    Constance Haw, MD Follow  up on 05/11/2019.   Specialty: Cardiology Why: at 1145 for 3 month s/p ablation follow up Contact information: [address] [place] [address] [phone]        [facility] Follow up on 03/11/2019.   Specialty: Cardiology Why: at 130 pm for 1 month s/p ablation follow up Contact information: [address] [id number] [place] [address] [phone]          Duration of Discharge Encounter: Greater than 30 minutes including physician time.  Signed, Gauge Winski, PA-C  02/12/2019 9:28 AM   I have seen and examined this patient with Oda Kilts.  Agree with above, note added to reflect my findings.  On exam, RRR, no murmurs, lungs clear.  Patient had AF ablation for paroxysmal atrial fibrillation.  He was hypotensive after the procedure and received 3 L of IV fluids and 2 runs of albumin.  His blood pressure came up but he did go into SVT for a short period of time.  No recurrent atrial fibrillation.  This morning he is feeling well.  His blood pressure has recovered.   Plan for discharge today with  follow up in EP clinic.  Makinzee Durley M. Patrisha Hausmann MD 02/12/2019 11:15 AM

## 2019-02-12 NOTE — Plan of Care (Signed)

## 2019-02-12 NOTE — Progress Notes (Signed)
  Pt SOB much better this am. SVT broke ~ 2230.  He states he last took po lasix Monday.  Denies SOB or CP at this time, sitting up in bed.     Will check BMET and Mg and give 40 mg IV lasix x 1. JVP ~9-10. Trace ankle edema at most.   Discussed above with Dr. Curt Bears.  Full note pending disposition; Likely home today.   Legrand Como [address] [place], [place]  02/12/2019 7:33 AM

## 2019-02-13 NOTE — Anesthesia Postprocedure Evaluation (Signed)
Anesthesia Post Note  Patient: George Vega  Procedure(s) Performed: ATRIAL FIBRILLATION ABLATION (N/A )     Anesthesia Post Evaluation  Last Vitals:  Vitals:   02/12/19 0609 02/12/19 0800  BP:    Pulse:  77  Resp:    Temp: (!) 36.4 C   SpO2:  99%    Last Pain:  Vitals:   02/12/19 0800  TempSrc:   PainSc: 0-No pain                 Domingue Coltrain

## 2019-02-13 NOTE — Addendum Note (Signed)
Addendum  created 02/13/19 0009 by Belinda Block, MD   Clinical Note Signed

## 2019-02-14 ENCOUNTER — Emergency Department (HOSPITAL_COMMUNITY): Payer: Medicare Other

## 2019-02-14 ENCOUNTER — Inpatient Hospital Stay (HOSPITAL_COMMUNITY): Payer: Medicare Other

## 2019-02-14 ENCOUNTER — Inpatient Hospital Stay (HOSPITAL_COMMUNITY)
Admission: EM | Admit: 2019-02-14 | Discharge: 2019-02-17 | DRG: 228 | Disposition: A | Discharge status: Home / Self Care | Payer: Medicare Other | Attending: Family Medicine | Admitting: Family Medicine

## 2019-02-14 ENCOUNTER — Other Ambulatory Visit: Payer: Self-pay

## 2019-02-14 ENCOUNTER — Encounter (HOSPITAL_COMMUNITY): Payer: Self-pay

## 2019-02-14 DIAGNOSIS — Z79899 Other long term (current) drug therapy: Secondary | ICD-10-CM

## 2019-02-14 DIAGNOSIS — E785 Hyperlipidemia, unspecified: Secondary | ICD-10-CM | POA: Diagnosis present

## 2019-02-14 DIAGNOSIS — Z96611 Presence of right artificial shoulder joint: Secondary | ICD-10-CM | POA: Diagnosis present

## 2019-02-14 DIAGNOSIS — I361 Nonrheumatic tricuspid (valve) insufficiency: Secondary | ICD-10-CM

## 2019-02-14 DIAGNOSIS — F329 Major depressive disorder, single episode, unspecified: Secondary | ICD-10-CM | POA: Diagnosis present

## 2019-02-14 DIAGNOSIS — F10231 Alcohol dependence with withdrawal delirium: Secondary | ICD-10-CM

## 2019-02-14 DIAGNOSIS — Z20828 Contact with and (suspected) exposure to other viral communicable diseases: Secondary | ICD-10-CM | POA: Diagnosis present

## 2019-02-14 DIAGNOSIS — C3431 Malignant neoplasm of lower lobe, right bronchus or lung: Secondary | ICD-10-CM

## 2019-02-14 DIAGNOSIS — Z888 Allergy status to other drugs, medicaments and biological substances status: Secondary | ICD-10-CM | POA: Diagnosis not present

## 2019-02-14 DIAGNOSIS — I959 Hypotension, unspecified: Secondary | ICD-10-CM | POA: Diagnosis not present

## 2019-02-14 DIAGNOSIS — I4819 Other persistent atrial fibrillation: Principal | ICD-10-CM | POA: Diagnosis present

## 2019-02-14 DIAGNOSIS — R7989 Other specified abnormal findings of blood chemistry: Secondary | ICD-10-CM

## 2019-02-14 DIAGNOSIS — Z96651 Presence of right artificial knee joint: Secondary | ICD-10-CM | POA: Diagnosis present

## 2019-02-14 DIAGNOSIS — I2510 Atherosclerotic heart disease of native coronary artery without angina pectoris: Secondary | ICD-10-CM | POA: Diagnosis present

## 2019-02-14 DIAGNOSIS — C3490 Malignant neoplasm of unspecified part of unspecified bronchus or lung: Secondary | ICD-10-CM | POA: Diagnosis present

## 2019-02-14 DIAGNOSIS — J329 Chronic sinusitis, unspecified: Secondary | ICD-10-CM | POA: Diagnosis present

## 2019-02-14 DIAGNOSIS — I5033 Acute on chronic diastolic (congestive) heart failure: Secondary | ICD-10-CM | POA: Diagnosis present

## 2019-02-14 DIAGNOSIS — J9601 Acute respiratory failure with hypoxia: Secondary | ICD-10-CM | POA: Diagnosis present

## 2019-02-14 DIAGNOSIS — I484 Atypical atrial flutter: Secondary | ICD-10-CM | POA: Diagnosis present

## 2019-02-14 DIAGNOSIS — Z91018 Allergy to other foods: Secondary | ICD-10-CM | POA: Diagnosis not present

## 2019-02-14 DIAGNOSIS — Z7901 Long term (current) use of anticoagulants: Secondary | ICD-10-CM | POA: Diagnosis not present

## 2019-02-14 DIAGNOSIS — J9600 Acute respiratory failure, unspecified whether with hypoxia or hypercapnia: Secondary | ICD-10-CM | POA: Diagnosis present

## 2019-02-14 DIAGNOSIS — I480 Paroxysmal atrial fibrillation: Secondary | ICD-10-CM | POA: Diagnosis not present

## 2019-02-14 DIAGNOSIS — C3400 Malignant neoplasm of unspecified main bronchus: Secondary | ICD-10-CM | POA: Diagnosis not present

## 2019-02-14 DIAGNOSIS — I471 Supraventricular tachycardia: Secondary | ICD-10-CM | POA: Diagnosis not present

## 2019-02-14 DIAGNOSIS — Z87891 Personal history of nicotine dependence: Secondary | ICD-10-CM | POA: Diagnosis not present

## 2019-02-14 DIAGNOSIS — I340 Nonrheumatic mitral (valve) insufficiency: Secondary | ICD-10-CM

## 2019-02-14 DIAGNOSIS — I25118 Atherosclerotic heart disease of native coronary artery with other forms of angina pectoris: Secondary | ICD-10-CM

## 2019-02-14 DIAGNOSIS — I110 Hypertensive heart disease with heart failure: Secondary | ICD-10-CM | POA: Diagnosis present

## 2019-02-14 DIAGNOSIS — R269 Unspecified abnormalities of gait and mobility: Secondary | ICD-10-CM | POA: Diagnosis present

## 2019-02-14 DIAGNOSIS — Z887 Allergy status to serum and vaccine status: Secondary | ICD-10-CM

## 2019-02-14 DIAGNOSIS — I10 Essential (primary) hypertension: Secondary | ICD-10-CM | POA: Diagnosis present

## 2019-02-14 DIAGNOSIS — E876 Hypokalemia: Secondary | ICD-10-CM | POA: Diagnosis not present

## 2019-02-14 DIAGNOSIS — Z8673 Personal history of transient ischemic attack (TIA), and cerebral infarction without residual deficits: Secondary | ICD-10-CM | POA: Diagnosis not present

## 2019-02-14 DIAGNOSIS — A419 Sepsis, unspecified organism: Secondary | ICD-10-CM

## 2019-02-14 DIAGNOSIS — Z9889 Other specified postprocedural states: Secondary | ICD-10-CM

## 2019-02-14 DIAGNOSIS — R778 Other specified abnormalities of plasma proteins: Secondary | ICD-10-CM | POA: Diagnosis not present

## 2019-02-14 DIAGNOSIS — I4891 Unspecified atrial fibrillation: Secondary | ICD-10-CM | POA: Diagnosis not present

## 2019-02-14 DIAGNOSIS — F10931 Alcohol use, unspecified with withdrawal delirium: Secondary | ICD-10-CM | POA: Diagnosis present

## 2019-02-14 LAB — CBC WITH DIFFERENTIAL/PLATELET
Abs Immature Granulocytes: 0.04 10*3/uL (ref 0.00–0.07)
Basophils Absolute: 0.1 10*3/uL (ref 0.0–0.1)
Basophils Relative: 1 %
Eosinophils Absolute: 0.5 10*3/uL (ref 0.0–0.5)
Eosinophils Relative: 5 %
HCT: 34.8 % — ABNORMAL LOW (ref 39.0–52.0)
Hemoglobin: 11.2 g/dL — ABNORMAL LOW (ref 13.0–17.0)
Immature Granulocytes: 0 %
Lymphocytes Relative: 13 %
Lymphs Abs: 1.4 10*3/uL (ref 0.7–4.0)
MCH: 32 pg (ref 26.0–34.0)
MCHC: 32.2 g/dL (ref 30.0–36.0)
MCV: 99.4 fL (ref 80.0–100.0)
Monocytes Absolute: 0.9 10*3/uL (ref 0.1–1.0)
Monocytes Relative: 8 %
Neutro Abs: 7.6 10*3/uL (ref 1.7–7.7)
Neutrophils Relative %: 73 %
Platelets: 148 10*3/uL — ABNORMAL LOW (ref 150–400)
RBC: 3.5 MIL/uL — ABNORMAL LOW (ref 4.22–5.81)
RDW: 13.2 % (ref 11.5–15.5)
WBC: 10.5 10*3/uL (ref 4.0–10.5)
nRBC: 0 % (ref 0.0–0.2)

## 2019-02-14 LAB — POCT I-STAT 7, (LYTES, BLD GAS, ICA,H+H)
Acid-base deficit: 1 mmol/L (ref 0.0–2.0)
Bicarbonate: 24.8 mmol/L (ref 20.0–28.0)
Calcium, Ion: 1.26 mmol/L (ref 1.15–1.40)
HCT: 33 % — ABNORMAL LOW (ref 39.0–52.0)
Hemoglobin: 11.2 g/dL — ABNORMAL LOW (ref 13.0–17.0)
O2 Saturation: 96 %
Patient temperature: 98.6
Potassium: 4.2 mmol/L (ref 3.5–5.1)
Sodium: 138 mmol/L (ref 135–145)
TCO2: 26 mmol/L (ref 22–32)
pCO2 arterial: 43.7 mmHg (ref 32.0–48.0)
pH, Arterial: 7.361 (ref 7.350–7.450)
pO2, Arterial: 86 mmHg (ref 83.0–108.0)

## 2019-02-14 LAB — URINALYSIS, ROUTINE W REFLEX MICROSCOPIC
Bilirubin Urine: NEGATIVE
Glucose, UA: NEGATIVE mg/dL
Hgb urine dipstick: NEGATIVE
Ketones, ur: NEGATIVE mg/dL
Leukocytes,Ua: NEGATIVE
Nitrite: NEGATIVE
Protein, ur: NEGATIVE mg/dL
Specific Gravity, Urine: 1.02 (ref 1.005–1.030)
pH: 5 (ref 5.0–8.0)

## 2019-02-14 LAB — COMPREHENSIVE METABOLIC PANEL
ALT: 64 U/L — ABNORMAL HIGH (ref 0–44)
AST: 66 U/L — ABNORMAL HIGH (ref 15–41)
Albumin: 3.6 g/dL (ref 3.5–5.0)
Alkaline Phosphatase: 115 U/L (ref 38–126)
Anion gap: 8 (ref 5–15)
BUN: 15 mg/dL (ref 8–23)
CO2: 25 mmol/L (ref 22–32)
Calcium: 8.9 mg/dL (ref 8.9–10.3)
Chloride: 107 mmol/L (ref 98–111)
Creatinine, Ser: 0.93 mg/dL (ref 0.61–1.24)
GFR calc Af Amer: >60 mL/min (ref >60)
GFR calc non Af Amer: >60 mL/min (ref >60)
Glucose, Bld: 127 mg/dL — ABNORMAL HIGH (ref 70–99)
Potassium: 4 mmol/L (ref 3.5–5.1)
Sodium: 140 mmol/L (ref 135–145)
Total Bilirubin: 0.7 mg/dL (ref 0.3–1.2)
Total Protein: 6 g/dL — ABNORMAL LOW (ref 6.5–8.1)

## 2019-02-14 LAB — PROTIME-INR
INR: 1.5 — ABNORMAL HIGH (ref 0.8–1.2)
Prothrombin Time: 17.7 seconds — ABNORMAL HIGH (ref 11.4–15.2)

## 2019-02-14 LAB — BASIC METABOLIC PANEL
Anion gap: 9 (ref 5–15)
BUN: 15 mg/dL (ref 8–23)
CO2: 24 mmol/L (ref 22–32)
Calcium: 8.8 mg/dL — ABNORMAL LOW (ref 8.9–10.3)
Chloride: 105 mmol/L (ref 98–111)
Creatinine, Ser: 0.92 mg/dL (ref 0.61–1.24)
GFR calc Af Amer: >60 mL/min (ref >60)
GFR calc non Af Amer: >60 mL/min (ref >60)
Glucose, Bld: 247 mg/dL — ABNORMAL HIGH (ref 70–99)
Potassium: 4 mmol/L (ref 3.5–5.1)
Sodium: 138 mmol/L (ref 135–145)

## 2019-02-14 LAB — ECHOCARDIOGRAM COMPLETE

## 2019-02-14 LAB — SARS CORONAVIRUS 2 (TAT 6-24 HRS): SARS Coronavirus 2: NEGATIVE

## 2019-02-14 LAB — APTT: aPTT: 35 seconds (ref 24–36)

## 2019-02-14 LAB — TROPONIN I (HIGH SENSITIVITY)
Troponin I (High Sensitivity): 558 ng/L (ref <18)
Troponin I (High Sensitivity): 432 ng/L (ref <18)
Troponin I (High Sensitivity): 379 ng/L (ref <18)

## 2019-02-14 LAB — BRAIN NATRIURETIC PEPTIDE: B Natriuretic Peptide: 208.6 pg/mL — ABNORMAL HIGH (ref 0.0–100.0)

## 2019-02-14 LAB — PROCALCITONIN: Procalcitonin: <0.1 ng/mL

## 2019-02-14 LAB — LACTIC ACID, PLASMA
Lactic Acid, Venous: 1 mmol/L (ref 0.5–1.9)
Lactic Acid, Venous: 1.4 mmol/L (ref 0.5–1.9)

## 2019-02-14 MED ORDER — LORAZEPAM 2 MG/ML IJ SOLN
0.5000 mg | Freq: Once | INTRAMUSCULAR | Status: AC
  Administered 2019-02-14: 0.5 mg via INTRAVENOUS
  Filled 2019-02-14: qty 1

## 2019-02-14 MED ORDER — SODIUM CHLORIDE 0.9 % IV SOLN
2.0000 g | Freq: Once | INTRAVENOUS | Status: AC
  Administered 2019-02-14: 2 g via INTRAVENOUS
  Filled 2019-02-14: qty 2

## 2019-02-14 MED ORDER — IRBESARTAN 150 MG PO TABS
150.0000 mg | ORAL_TABLET | Freq: Every day | ORAL | Status: DC
  Administered 2019-02-14 – 2019-02-17 (×4): 150 mg via ORAL
  Filled 2019-02-14 (×4): qty 1

## 2019-02-14 MED ORDER — ALBUTEROL SULFATE HFA 108 (90 BASE) MCG/ACT IN AERS: 2.0000 | INHALATION_SPRAY | RESPIRATORY_TRACT | Status: DC | PRN

## 2019-02-14 MED ORDER — FUROSEMIDE 10 MG/ML IJ SOLN
40.0000 mg | Freq: Once | INTRAMUSCULAR | Status: AC
  Administered 2019-02-14: 40 mg via INTRAVENOUS
  Filled 2019-02-14: qty 4

## 2019-02-14 MED ORDER — VANCOMYCIN HCL IN DEXTROSE 750-5 MG/150ML-% IV SOLN: 750.0000 mg | Freq: Two times a day (BID) | INTRAVENOUS | Status: DC

## 2019-02-14 MED ORDER — PAROXETINE HCL 30 MG PO TABS
30.0000 mg | ORAL_TABLET | Freq: Every day | ORAL | Status: DC
  Administered 2019-02-14 – 2019-02-16 (×3): 30 mg via ORAL
  Filled 2019-02-14 (×4): qty 1

## 2019-02-14 MED ORDER — POLYETHYL GLYCOL-PROPYL GLYCOL 0.4-0.3 % OP GEL: Freq: Two times a day (BID) | OPHTHALMIC | Status: DC | PRN

## 2019-02-14 MED ORDER — ALBUTEROL SULFATE (2.5 MG/3ML) 0.083% IN NEBU
2.5000 mg | INHALATION_SOLUTION | RESPIRATORY_TRACT | Status: DC | PRN
  Administered 2019-02-15: 2.5 mg via RESPIRATORY_TRACT
  Filled 2019-02-14: qty 3

## 2019-02-14 MED ORDER — SODIUM CHLORIDE 0.9% FLUSH: 3.0000 mL | INTRAVENOUS | Status: DC | PRN

## 2019-02-14 MED ORDER — FUROSEMIDE 10 MG/ML IJ SOLN
40.0000 mg | Freq: Two times a day (BID) | INTRAMUSCULAR | Status: DC
  Administered 2019-02-14 – 2019-02-15 (×2): 40 mg via INTRAVENOUS
  Filled 2019-02-14 (×2): qty 4

## 2019-02-14 MED ORDER — DEXAMETHASONE SODIUM PHOSPHATE 10 MG/ML IJ SOLN
10.0000 mg | Freq: Once | INTRAMUSCULAR | Status: AC
  Administered 2019-02-14: 10 mg via INTRAVENOUS
  Filled 2019-02-14: qty 1

## 2019-02-14 MED ORDER — TAMSULOSIN HCL 0.4 MG PO CAPS
0.4000 mg | ORAL_CAPSULE | Freq: Every day | ORAL | Status: DC
  Administered 2019-02-14 – 2019-02-16 (×3): 0.4 mg via ORAL
  Filled 2019-02-14 (×3): qty 1

## 2019-02-14 MED ORDER — ONDANSETRON HCL 4 MG/2ML IJ SOLN: 4.0000 mg | Freq: Four times a day (QID) | INTRAMUSCULAR | Status: DC | PRN

## 2019-02-14 MED ORDER — SODIUM CHLORIDE 0.9 % IV SOLN
2.0000 g | Freq: Three times a day (TID) | INTRAVENOUS | Status: DC
  Administered 2019-02-14 – 2019-02-15 (×3): 2 g via INTRAVENOUS
  Filled 2019-02-14 (×6): qty 2

## 2019-02-14 MED ORDER — IOHEXOL 350 MG/ML SOLN
100.0000 mL | Freq: Once | INTRAVENOUS | Status: AC | PRN
  Administered 2019-02-14: 100 mL via INTRAVENOUS

## 2019-02-14 MED ORDER — DOFETILIDE 250 MCG PO CAPS
250.0000 ug | ORAL_CAPSULE | Freq: Two times a day (BID) | ORAL | Status: DC
  Administered 2019-02-14 – 2019-02-17 (×7): 250 ug via ORAL
  Filled 2019-02-14 (×8): qty 1

## 2019-02-14 MED ORDER — SODIUM CHLORIDE 0.9 % IV SOLN
250.0000 mL | INTRAVENOUS | Status: DC | PRN
  Administered 2019-02-15: 22:00:00 10 mL via INTRAVENOUS

## 2019-02-14 MED ORDER — MELATONIN 10 MG PO TABS: 10.0000 mg | ORAL_TABLET | Freq: Every evening | ORAL | Status: DC | PRN

## 2019-02-14 MED ORDER — LORAZEPAM 1 MG PO TABS
1.0000 mg | ORAL_TABLET | ORAL | Status: AC | PRN
  Administered 2019-02-14: 1 mg via ORAL
  Filled 2019-02-14: qty 1

## 2019-02-14 MED ORDER — CALCIUM POLYCARBOPHIL 625 MG PO TABS
1250.0000 mg | ORAL_TABLET | Freq: Every day | ORAL | Status: DC
  Administered 2019-02-14 – 2019-02-16 (×3): 1250 mg via ORAL
  Filled 2019-02-14 (×4): qty 2

## 2019-02-14 MED ORDER — METRONIDAZOLE IN NACL 5-0.79 MG/ML-% IV SOLN
500.0000 mg | Freq: Once | INTRAVENOUS | Status: AC
  Administered 2019-02-14: 500 mg via INTRAVENOUS
  Filled 2019-02-14: qty 100

## 2019-02-14 MED ORDER — POLYVINYL ALCOHOL 1.4 % OP SOLN
1.0000 [drp] | Freq: Two times a day (BID) | OPHTHALMIC | Status: DC | PRN
  Administered 2019-02-15: 1 [drp] via OPHTHALMIC
  Filled 2019-02-14: qty 15

## 2019-02-14 MED ORDER — ACETAMINOPHEN 325 MG PO TABS: 650.0000 mg | ORAL_TABLET | ORAL | Status: DC | PRN

## 2019-02-14 MED ORDER — MELATONIN 3 MG PO TABS
9.0000 mg | ORAL_TABLET | Freq: Every evening | ORAL | Status: DC | PRN
  Administered 2019-02-14 – 2019-02-16 (×3): 9 mg via ORAL
  Filled 2019-02-14 (×4): qty 3

## 2019-02-14 MED ORDER — SODIUM CHLORIDE 0.9% FLUSH
3.0000 mL | Freq: Two times a day (BID) | INTRAVENOUS | Status: DC
  Administered 2019-02-14 – 2019-02-16 (×5): 3 mL via INTRAVENOUS

## 2019-02-14 MED ORDER — MAGNESIUM OXIDE 400 (241.3 MG) MG PO TABS
400.0000 mg | ORAL_TABLET | Freq: Every day | ORAL | Status: DC
  Administered 2019-02-14 – 2019-02-16 (×3): 400 mg via ORAL
  Filled 2019-02-14 (×3): qty 1

## 2019-02-14 MED ORDER — METOPROLOL TARTRATE 25 MG PO TABS
25.0000 mg | ORAL_TABLET | Freq: Two times a day (BID) | ORAL | Status: DC
  Administered 2019-02-14 – 2019-02-17 (×7): 25 mg via ORAL
  Filled 2019-02-14 (×7): qty 1

## 2019-02-14 MED ORDER — ADULT MULTIVITAMIN W/MINERALS CH
1.0000 | ORAL_TABLET | Freq: Every day | ORAL | Status: DC
  Administered 2019-02-14 – 2019-02-17 (×4): 1 via ORAL
  Filled 2019-02-14 (×4): qty 1

## 2019-02-14 MED ORDER — VANCOMYCIN HCL IN DEXTROSE 1-5 GM/200ML-% IV SOLN
1000.0000 mg | Freq: Once | INTRAVENOUS | Status: AC
  Administered 2019-02-14: 1000 mg via INTRAVENOUS
  Filled 2019-02-14: qty 200

## 2019-02-14 MED ORDER — ROSUVASTATIN CALCIUM 5 MG PO TABS
5.0000 mg | ORAL_TABLET | Freq: Every day | ORAL | Status: DC
  Administered 2019-02-14 – 2019-02-17 (×4): 5 mg via ORAL
  Filled 2019-02-14 (×4): qty 1

## 2019-02-14 MED ORDER — ALBUTEROL SULFATE HFA 108 (90 BASE) MCG/ACT IN AERS
8.0000 | INHALATION_SPRAY | Freq: Once | RESPIRATORY_TRACT | Status: AC
  Administered 2019-02-14: 8 via RESPIRATORY_TRACT
  Filled 2019-02-14: qty 6.7

## 2019-02-14 MED ORDER — LORAZEPAM 2 MG/ML IJ SOLN: 1.0000 mg | INTRAMUSCULAR | Status: AC | PRN

## 2019-02-14 MED ORDER — ALBUTEROL SULFATE HFA 108 (90 BASE) MCG/ACT IN AERS: 4.0000 | INHALATION_SPRAY | Freq: Once | RESPIRATORY_TRACT | Status: DC

## 2019-02-14 MED ORDER — VITAMIN B-1 100 MG PO TABS
100.0000 mg | ORAL_TABLET | Freq: Every day | ORAL | Status: DC
  Administered 2019-02-14 – 2019-02-17 (×3): 100 mg via ORAL
  Filled 2019-02-14 (×4): qty 1

## 2019-02-14 MED ORDER — THIAMINE HCL 100 MG/ML IJ SOLN
100.0000 mg | Freq: Every day | INTRAMUSCULAR | Status: DC
  Administered 2019-02-16: 100 mg via INTRAVENOUS
  Filled 2019-02-14 (×2): qty 2

## 2019-02-14 MED ORDER — PANTOPRAZOLE SODIUM 40 MG PO TBEC
40.0000 mg | DELAYED_RELEASE_TABLET | Freq: Every day | ORAL | Status: DC
  Administered 2019-02-14 – 2019-02-17 (×4): 40 mg via ORAL
  Filled 2019-02-14 (×4): qty 1

## 2019-02-14 MED ORDER — APIXABAN 5 MG PO TABS
5.0000 mg | ORAL_TABLET | Freq: Two times a day (BID) | ORAL | Status: DC
  Administered 2019-02-14 – 2019-02-17 (×7): 5 mg via ORAL
  Filled 2019-02-14 (×8): qty 1

## 2019-02-14 MED ORDER — FOLIC ACID 1 MG PO TABS
1.0000 mg | ORAL_TABLET | Freq: Every day | ORAL | Status: DC
  Administered 2019-02-14 – 2019-02-17 (×4): 1 mg via ORAL
  Filled 2019-02-14 (×4): qty 1

## 2019-02-14 NOTE — ED Notes (Signed)
Dr. Dayna Barker aware that patient is requesting something for anxiety, pt reports hx of anxiety and states he took 20mg  klonopin approximately 5 hours ago but got no relief. MD spoke with patient regarding anxiety meds prior to patient being transported to [facility]

## 2019-02-14 NOTE — H&P (Signed)
History and Physical    George Vega WGN:[id number] DOB: 1940-06-19 DOA: 02/14/2019  PCP: [name], MD Consultants:  [facility]- cardiology; [facility] - oncology; [facility] - pulmonology; [facility] - rad onc Patient coming from:  Home - lives with wife; NOK: Dawna Part, [phone], [phone]  Chief Complaint: SOB  HPI: George Vega is a 78 y.o. 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  The patient was sleeping comfortably and somewhat difficult to arouse at the time of my encounter.  When he did wake up, he was clearly confused and this did not clear throughout our encounter.  He was oriented to person (but it took him a few seconds) and to the year, but was unable to figure out where he was.  The first time, he said he'd have to think about.  The second time he looked around the room and said, "that's the front of the bus; there's the window; there's the kitchen...".  He did acknowledge SOB and denied fever, cough, weight gain, or edema.  He was admitted from 12/2-3 for afib, SVT, acute on chronic diastolic CHF, and hypotension and had ablation on 12/2; afib ablation was successful on the left pulmonary veins but not on the right.  He had post-procedure SVT with hypotension and was given 3L IVF.  He was discharged on Eliquis after getting 40 mg IV Lasix x 1 prior to d/c.  He has not done well since.  He was kept overnight because he had problems with SVT post-operatively.  He came home Thursday afternoon, "huffing and puffing and wheezing."  He didn't feel well that day and worsened the night.  He had SOB, difficulty lying flat.  He experienced that more throughout the day, with episodic tachypnea and SOB, light-headed.  No fever.  He has eaten.  They were concerned that he was in afib again.  He has not been confused, "not severely."  She has not noticed confusion.  No chest pain.  He is a very sound sleeper,  difficult to wake up.  Months ago, he had an episode with SOB and it was attributed to his iron level being very depleted, "could he be taking too much iron?".  ED Course:  Carryover, per Dr. Marlowe Sax:  Patient recently had ablation done for A. fib and was intubated. Presenting to the ED with complaints of acute onset shortness of breath that started last night. Oxygen saturation 82% on room air by EMS and was placed on nonrebreather. Noted to have stridor and febrile with temperature 100.4 F. CT without any explanation for stridor/swelling/signs of upper airway occlusion. CT chest with a very tiny area of questionable consolidation. UA not suggestive of infection. Started on broad-spectrum antibiotics and was given albuterol plus steroids. Now better and satting 95% on 4 L oxygen but continues to have an upper airway sound. Also has a history of diastolic congestive heart failure and chest imaging with small right pleural effusion. BNP pending.       Review of Systems: As per HPI; otherwise review of systems reviewed and negative.  The accuracy of this information is suspect.    Past Medical History:  Diagnosis Date   Arthritis    Atrial fibrillation, persistent ([place])    Chronic sinusitis    Coronary artery disease    Depression    Difficult intubation    was told with shoulder done 2006-alittle narrow   Gait disorder 05/28/2014  Hypertension    Jejunostomy tube fell out    when asked about this in 04/2017, pt denied ever having a J tube, feeding tube, tubes placed post surgery so ??? veracity of a previous J tube.     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   Spastic colon    Squamous cell carcinoma of lung, stage I, right ([place]) 05/07/2018   bx 04/29/18; isolated PET uptake in RLL mass   Stroke (cerebrum) (HCC)    SVT (supraventricular tachycardia) ([place])     Past Surgical History:  Procedure Laterality Date   ATRIAL FIBRILLATION  ABLATION N/A 02/11/2019   Procedure: ATRIAL FIBRILLATION ABLATION;  Surgeon: Constance Haw, MD;  Location: [facility];  Service: Cardiovascular;  Laterality: N/A;   CARDIAC CATHETERIZATION     CATARACT EXTRACTION, BILATERAL     COLONOSCOPY WITH PROPOFOL N/A 04/17/2017   Procedure: COLONOSCOPY WITH PROPOFOL;  Surgeon: Yetta Flock, MD;  Location: [facility];  Service: Gastroenterology;  Laterality: N/A;   ESOPHAGEAL DILATION  2017   ESOPHAGOGASTRODUODENOSCOPY (EGD) WITH PROPOFOL N/A 04/17/2017   Procedure: ESOPHAGOGASTRODUODENOSCOPY (EGD) WITH PROPOFOL;  Surgeon: Yetta Flock, MD;  Location: [facility];  Service: Gastroenterology;  Laterality: N/A;   EYE SURGERY     FOOT NEUROMA SURGERY  2002   LEFT HEART CATH AND CORONARY ANGIOGRAPHY N/A 01/16/2019   Procedure: LEFT HEART CATH AND CORONARY ANGIOGRAPHY;  Surgeon: Jettie Booze, MD;  Location: [facility];  Service: Cardiovascular;  Laterality: N/A;   LEFT HEART CATHETERIZATION WITH CORONARY ANGIOGRAM Right 06/14/2011   20% LM, chronic occluded mid LAD, 50% ostial LCX, 20% mid RI, RCA with collaterals to mid LAD, mid 10% stenosis, EF 60% 06/14/11   NASAL SINUS SURGERY     SHOULDER ARTHROSCOPY  03/01/2011   Procedure: ARTHROSCOPY SHOULDER;  Surgeon: Ninetta Lights, MD;  Location: [facility];  Service: Orthopedics;  Laterality: Right;  arthroscopy shoulder decompression subacromial partial acromioplasty with coracoacromial release, distal claviculectomy, debridement of labrium   SHOULDER ARTHROSCOPY  03/01/2011   Procedure: ARTHROSCOPY SHOULDER;  Surgeon: Ninetta Lights, MD;  Location: [facility];  Service: Orthopedics;  Laterality: Right;  arthroscopy shoulder decompression subacromial partial acromioplasty with coracoacromial release, distal claviculectomy, debridement of labrium   TEE WITHOUT CARDIOVERSION N/A 03/19/2016   Procedure: TRANSESOPHAGEAL ECHOCARDIOGRAM  (TEE);  Surgeon: Larey Dresser, MD;  Location: [facility];  Service: Cardiovascular;  Laterality: N/A;   TOTAL KNEE ARTHROPLASTY Right 09/23/2013   Procedure: TOTAL KNEE ARTHROPLASTY;  Surgeon: Ninetta Lights, MD;  Location: [facility];  Service: Orthopedics;  Laterality: Right;   TOTAL SHOULDER ARTHROPLASTY  06/28/2011   Procedure: TOTAL SHOULDER ARTHROPLASTY;  Surgeon: Ninetta Lights, MD;  Location: [facility];  Service: Orthopedics;  Laterality: Right;   UVULOPALATOPHARYNGOPLASTY     VIDEO ASSISTED THORACOSCOPY (VATS)/WEDGE RESECTION Right 06/02/2018   Procedure: VIDEO ASSISTED THORACOSCOPY (VATS)/WEDGE RESECTION with Lymph node disection and intercostal nerve block.;  Surgeon: [name], MD;  Location: [facility];  Service: Thoracic;  Laterality: Right;    Social History   Socioeconomic History   Marital status: Married    Spouse name: Not on file   Number of children: 3   Years of education: Not on file   Highest education level: Not on file  Occupational History   Occupation: retired  Scientist, product/process development strain: Not on file   Food insecurity    Worry:  Not on file    Inability: Not on file   Transportation needs    Medical: Not on file    Non-medical: Not on file  Tobacco Use   Smoking status: Former Smoker    Packs/day: 2.00    Years: 30.00    Pack years: 60.00    Types: Cigarettes    Quit date: 02/27/1998    Years since quitting: 20.9   Smokeless tobacco: Never Used  Substance and Sexual Activity   Alcohol use: Yes    Alcohol/week: 14.0 standard drinks    Types: 14 Standard drinks or equivalent per week    Comment: 2 drinks daily ([place])   Drug use: No   Sexual activity: Not on file  Lifestyle   Physical activity    Days per week: Not on file    Minutes per session: Not on file   Stress: Not on file  Relationships   Social connections    Talks on phone: Not on file    Gets together: Not on file    Attends  religious service: Not on file    Active member of club or organization: Not on file    Attends meetings of clubs or organizations: Not on file    Relationship status: Not on file   Intimate partner violence    Fear of current or ex partner: Not on file    Emotionally abused: Not on file    Physically abused: Not on file    Forced sexual activity: Not on file  Other Topics Concern   Not on file  Social History Narrative   Patient is left handed.   Patient drinks one cup caffeine daily.    Allergies  Allergen Reactions   Glucosamine-Chondroitin Anaphylaxis and Other (See Comments)    Stomach cramps, can't eat    Tetanus Toxoid Anaphylaxis and Hives    hives/throat swells shut    Fish Oil Other (See Comments)    Stomach cramps, can't eat    Ibuprofen Nausea Only and Other (See Comments)    Stomach pains    Naproxen Diarrhea    Family History  Problem Relation Age of Onset   Cancer Mother        colon   Heart attack Father    Migraines Brother     Prior to Admission medications   Medication Sig Start Date End Date Taking? Authorizing Provider  acetaminophen (TYLENOL) 325 MG tablet Take 650 mg by mouth every 6 (six) hours as needed for moderate pain.    [provider]  apixaban (ELIQUIS) 5 MG TABS tablet Take 1 tablet (5 mg total) by mouth 2 (two) times daily. 02/12/19   Shirley Friar, PA-C  Cholecalciferol (VITAMIN D3) 5000 units TABS Take 5,000 Units by mouth daily.    [provider]  Cyanocobalamin (VITAMIN B-12) 500 MCG SUBL Place 500 mcg under the tongue 3 (three) times a week.     [provider]  dofetilide (TIKOSYN) 250 MCG capsule TAKE 1 CAPSULE BY MOUTH TWO TIMES DAILY Patient taking differently: Take 250 mcg by mouth 2 (two) times daily.  01/16/19   Evans Lance, MD  ferrous sulfate (FEROSUL) 325 (65 FE) MG tablet Take 1 tablet (325 mg total) by mouth 2 (two) times daily with a meal. 06/06/18   [name] M, PA-C  fluticasone (FLONASE) 50 MCG/ACT nasal spray Place 1-2 sprays into both nostrils daily as needed for allergies or rhinitis.    [provider]  furosemide (LASIX) 40 MG tablet TAKE 1 TABLET(40 MG) BY MOUTH DAILY Patient taking differently: Take 40 mg by mouth 3 (three) times a week.  01/08/18   Evans Lance, MD  irbesartan (AVAPRO) 150 MG tablet TAKE 1 TABLET(150 MG) BY MOUTH DAILY Patient taking differently: Take 150 mg by mouth daily. TAKE 1 TABLET(150 MG) BY MOUTH DAILY 11/25/18   Evans Lance, MD  magnesium oxide (MAG-OX) 400 MG tablet Take 400 mg by mouth daily.    [provider]  Melatonin 10 MG TABS Take 10 mg by mouth at bedtime as needed (sleep).     [provider]  metoprolol tartrate (LOPRESSOR) 25 MG tablet TAKE 1 TABLET BY MOUTH 2  TIMES DAILY. Patient taking differently: Take 25 mg by mouth 2 (two) times daily.  08/19/18   Evans Lance, MD  metoprolol tartrate (LOPRESSOR) 25 MG tablet Take 1-2 tablets (25-50 mg total) by mouth daily as needed (for breakthrough palpitations). 11/20/18 02/18/19  Evans Lance, MD  Multiple Vitamins-Minerals (PRESERVISION AREDS 2 PO) Take 1 capsule by mouth 2 (two) times daily.    [provider]  pantoprazole (PROTONIX) 40 MG tablet Take 40 mg by mouth daily.  02/22/17   [provider]  PARoxetine (PAXIL) 30 MG tablet Take 30 mg by mouth at bedtime.  01/25/17   [provider]  polycarbophil (FIBERCON) 625 MG tablet Take 1,250 mg by mouth at bedtime.     [provider]  Polyethyl Glycol-Propyl Glycol (SYSTANE OP) Place 2 drops into both eyes 2 (two) times daily as needed (dry eyes).     [provider]  potassium chloride (K-DUR) 10 MEQ tablet TAKE 1 TABLET BY MOUTH EVERY DAY FOR 3 DAYS. THEN TAKE ONLY WHEN TAKING LASIX AS DIRECTED. Patient taking differently: Take 10 mEq by mouth 3 (three) times a week. Takes with Lasix 11/25/18   Evans Lance, MD    rosuvastatin (CRESTOR) 5 MG tablet TAKE 1 TABLET BY MOUTH  EVERY MORNING Patient taking differently: Take 5 mg by mouth daily.  10/23/18   Evans Lance, MD  tamsulosin (FLOMAX) 0.4 MG CAPS capsule Take 0.4 mg by mouth at bedtime.  12/04/16   [provider]    Physical Exam: Vitals:   02/14/19 0900 02/14/19 0915 02/14/19 0930 02/14/19 0945  BP: (!) 136/93 (!) 148/109 (!) 153/80 (!) 161/92  Pulse: 84 85 82 81  Resp: (!) 24 (!) 26 (!) 25 (!) 25  Temp:      TempSrc:      SpO2: 95% 92% 97% 97%      General:  Very somnolent, snoring with mild upper airway noise; hard to arouse (wife reports this is baseline), confused upon awakening and this really didn't clear  Eyes:  PERRL, EOMI, normal lids, iris  ENT:  grossly normal hearing, lips & tongue, mmm  Neck:  no LAD, masses or thyromegaly; mild upper airway noise  Cardiovascular:  RRR, no m/r/g. No LE edema.   Respiratory:   Upper airway noise but otherwise ?CTA.  Increased respiratory effort with tachypnea and use of accessory muscles, particularly with abdominal breathing  Abdomen:  soft, NT, ND, NABS; +abdominal breathing  Skin:  no rash or induration seen on limited exam  Musculoskeletal:  grossly normal tone BUE/BLE, good ROM, no bony abnormality  Psychiatric:  confused mood and affect, speech fluent and appropriate, AOx1-2  Neurologic:  CN 2-12 grossly intact, moves all extremities in coordinated fashion, limited exam  Radiological Exams on Admission: Dg Neck Soft Tissue  Result Date: 02/14/2019 CLINICAL DATA:  Initial evaluation for acute stridor, shortness of breath. EXAM: NECK SOFT TISSUES - 1+ VIEW COMPARISON:  None. FINDINGS: Epiglottis within normal limits. Question mild retropharyngeal/prevertebral soft tissue fullness at the level of C5-6, measuring up to approximately 2 cm. No retropharyngeal gas. Small linear radiopaque density seen just anterior to the C3 vertebral body felt to be related to thyroid  or cricoid calcification. No radiopaque foreign body. Advanced multilevel degenerative spondylosis, most notable at C3-4. Remainder of the visualized soft tissues of the neck demonstrate no acute finding. Probable vascular calcifications overlie the region of both carotid bifurcations. Scattered peribronchial thickening seen within the visualized upper lungs. Probable superimposed right upper lobe atelectatic changes. IMPRESSION: 1. Question mild retropharyngeal soft tissue fullness at the level of C5-6, which could reflect edema and/or effusion. No frank retropharyngeal gas. Normal epiglottis. 2. Aortic Atherosclerosis (ICD10-I70.0). 3. Moderate to advanced multilevel cervical spondylosis, most notable at C3-4. Results discussed by telephone at the time of interpretation on 02/14/2019 at 3 STIR:41 am to [place]. Electronically Signed   By: Jeannine Boga M.D.   On: 02/14/2019 03:42   Ct Soft Tissue Neck W Contrast  Result Date: 02/14/2019 CLINICAL DATA:  Initial evaluation for acute sore throat, stridor. EXAM: CT NECK WITH CONTRAST TECHNIQUE: Multidetector CT imaging of the neck was performed using the standard protocol following the bolus administration of intravenous contrast. CONTRAST:  159mL OMNIPAQUE IOHEXOL 350 MG/ML SOLN COMPARISON:  Prior radiograph from earlier same day. FINDINGS: Pharynx and larynx: Oral cavity within normal limits without discrete mass or collection. No acute abnormality about the dentition. Palatine tonsils symmetric and within normal limits. Few punctate calcified tonsilliths noted on the right. Parapharyngeal fat maintained. Nasopharynx and the remainder of the oropharynx within normal limits. Epiglottis normal. Vallecula clear. Remainder of the hypopharynx and supraglottic larynx within normal limits. No retropharyngeal collection, effusion, or significant soft tissue swelling. Previously question prevertebral/retropharyngeal fullness likely related to  positioning and/or summation of shadows on prior radiograph. True cords symmetric and normal. Subglottic airway widely patent and clear. Salivary glands: Salivary glands including the parotid and submandibular glands within normal limits. Thyroid: Thyroid within normal limits. No discrete thyroid nodule or mass. Lymph nodes: No pathologically enlarged lymph nodes identified within the neck. Vascular: Moderate atherosclerotic change present about the aortic arch, carotid bifurcations, and carotid siphons. Normal intravascular enhancement seen throughout the neck. Limited intracranial: Age-related cerebral atrophy. Otherwise unremarkable. Visualized orbits: Globes and orbital soft tissues within normal limits. Mastoids and visualized paranasal sinuses: Chronic mucosal thickening noted within the ethmoidal air cells, sphenoid sinuses, and maxillary sinuses. Superimposed air-fluid levels noted within the sphenoid sinuses, suggesting acute on chronic disease. Mastoid air cells and middle ear cavities well pneumatized and free of fluid. Skeleton: No acute osseous abnormality. No discrete lytic or blastic osseous lesions. Moderate multilevel cervical spondylosis, most notable at C3-4 and C5-6. Upper chest: Small layering bilateral pleural effusions, right greater than left, partially visualized. Associated scattered atelectatic changes seen within the visualized lungs. Partially visualized upper chest otherwise demonstrates no acute finding. Other: None. IMPRESSION: 1. Negative CT of the neck. No discrete mass or collection identified. Previously questioned prevertebral/retropharyngeal fullness likely related to positioning and/or summation of shadows on prior radiograph. 2. Small layering bilateral pleural effusions, right greater than left, partially visualized. 3. Chronic pan sinusitis with superimposed air-fluid levels within the sphenoid sinuses, suggesting acute on chronic disease. 4.  Aortic Atherosclerosis  (ICD10-I70.0).  Electronically Signed   By: Jeannine Boga M.D.   On: 02/14/2019 05:22   Ct Angio Chest Pe W And/or Wo Contrast  Result Date: 02/14/2019 CLINICAL DATA:  Short of breath, recent cardiac ablation for supraventricular tachycardia. EXAM: CT ANGIOGRAPHY CHEST WITH CONTRAST TECHNIQUE: Multidetector CT imaging of the chest was performed using the standard protocol during bolus administration of intravenous contrast. Multiplanar CT image reconstructions and MIPs were obtained to evaluate the vascular anatomy. CONTRAST:  140mL OMNIPAQUE IOHEXOL 350 MG/ML SOLN COMPARISON:  Chest CT 01/02/2019 FINDINGS: Cardiovascular: No filling defects within the pulmonary arteries to suggest acute pulmonary embolism. No acute findings of the aorta or great vessels. No pericardial fluid. Coronary artery calcification and aortic atherosclerotic calcification. Mediastinum/Nodes: No axillary supraclavicular adenopathy. Mild RIGHT hilar adenopathy with a 14 mm hilar lymph node. Lungs/Pleura: New small RIGHT pleural effusion. Small focus of consolidation at the RIGHT lung base measuring 2.5 cm (image 94/6). There is mild interlobular septal thickening in the lungs. Mild ground-glass pattern lungs additionally. Upper Abdomen: Limited view of the liver, kidneys, pancreas are unremarkable. Normal adrenal glands. Nonobstructing calculus in the upper pole of the LEFT kidney measuring 3 mm. Musculoskeletal: No aggressive osseous lesion Review of the MIP images confirms the above findings. IMPRESSION: 1. No evidence acute pulmonary embolism. 2. New small RIGHT pleural effusion.  Mild pulmonary edema pattern. 3. Small focus of consolidation at the RIGHT lung base is favored atelectasis. Cannot exclude small focus of infection. Infarction unlikely. 4. Mild RIGHT hilar adenopathy is likely reactive. 5. Aortic Atherosclerosis (ICD10-I70.0). Electronically Signed   By: Suzy Bouchard M.D.   On: 02/14/2019 05:08   Dg Chest  Portable 1 View  Result Date: 02/14/2019 CLINICAL DATA:  Initial evaluation for acute shortness of breath. EXAM: PORTABLE CHEST 1 VIEW COMPARISON:  Prior radiograph from 07/24/2018. FINDINGS: Mild cardiomegaly, stable. Mediastinal silhouette within normal limits. Aortic atherosclerosis. Lungs hypoinflated with elevation of the right hemidiaphragm, similar to previous. Suture line overlies the mid-lower right lung. Interval increase in prominence of the interstitial and peribronchial markings, which could reflect acute bronchiolitis or possibly mild interstitial congestion/edema. Blunting of the right costophrenic angle could reflect small effusion versus chronic pleural reaction/scarring, similar to previous. Irregular scarring and/or atelectatic changes present within the mid and lower right lung as well. No other consolidative airspace disease. No pneumothorax. No acute osseous finding. Bilateral shoulder arthroplasties noted. IMPRESSION: 1. Interval increase in prominence of the interstitial and peribronchial markings, which could reflect acute bronchiolitis or possibly mild interstitial congestion/edema. 2. Blunting of the right costophrenic angle, which could reflect a small right pleural effusion versus chronic pleural reaction/scarring. 3. Additional scarring and/or atelectatic changes within the mid and lower right lung, stable. 4. Stable cardiomegaly with aortic atherosclerosis. Electronically Signed   By: Jeannine Boga M.D.   On: 02/14/2019 03:45    EKG: Independently reviewed.  NSR with rate 86; nonspecific ST changes with no evidence of acute ischemia; NSCSLT   Labs on Admission: I have personally reviewed the available labs and imaging studies at the time of the admission.  Pertinent labs:   Glucose 127 AST 66/ALT 64  BNP 208.6 Lactate 1.0, 1.4 WBC 10.5 Hgb 11.2 Platelets 148 INR 1.5 HS troponin 558 UA WNL Blood cultures pending COVID negative ABG:  7.361/43.7/86.0/96%  Assessment/Plan Principal Problem:   Acute respiratory failure (HCC) Active Problems:   CAD (coronary artery disease)   HTN (hypertension)   DTs (delirium tremens) (HCC)   Lung cancer (HCC)   Paroxysmal atrial  fibrillation ([place])   H/O cardiac radiofrequency ablation    Acute respiratory failure with hypoxia -Patient with recent (12/2) radiofrequency ablation for afib now presenting with acute respiratory failure to the 80s -This appears to be most likely related to his recent procedure -Ddx includes: volume overload (most likely, see below); airway trauma (imaging with ?mild retropharyngeal edema but normal epiglottis); pericardial effusion (not seen on CTA but stat echo ordered); PTX from catheter (not seen on imaging); MI from clot (elevated troponin, this is still clearly on ddx but EKG unremarkable); Sepsis (initial concern for this in the ER and given BS abx but appears less likely now, will check procalcitonin); Aspiration (will continue coverage with Cefepime only while obtaining swallow evaluation) -Normal ABG, which is reassuring -Given broad and concerning ddx, will admit to Progressive Care Unit for now and continue to closely monitor -Cardiology consult is pending -COVID negative, can have Albuterol nebs and/or MDI  PAF s/p ablation with post-op SVT -Patient had EPS/RFCA of afib on 12/2 -He developed post-op SVT overnight, that broke spontaneously -During that episode, he had hypotension that resolved with 3L IVF - likely leading to volume overload (see below) -Will continue Tikosyn and Eliquis for now  Acute on chronic diastolic CHF -5/64 echo with grade 2 diastolic dysfunction -He developed hypotension during his episode of SVT and was given 3L IVF -He was diuresed with 40 mg IV Lasix prior to d/c -He has had ongoing SOB episodically since d/c -Strongly suspect that symptoms are related to volume overload -CXR with possible pulmonary edema -CT  with mild pulmonary edema pattern as well as ?small infiltrate vs. Scarring -Will give 40 mg IV Lasix now and continue with 40 mg IV BID -Cardiology consult is pending -STAT echo is pending   AMS, concern for DTs -Confusion is new and wife denies h/o dementia -On chart review, patient was hospitalized in 2015 and developed DTs -I called wife back and discussed his drinking patterns -He drinks 2-3 drinks nightly but has been able to drink only 1 1/2 drinks total since Tuesday night -Suspect that his currently AMS is related to ETOH witdrawal -Will initiate CIWA protocol with MVI, thiamine, folate, and Ativan -Continue MagOx  CAD -Prior h/o CAD -MI from clot during the procedure is a consideration -His initial troponin is quite elevated - but this may be related to his recent ablation procedure -Will trend HS troponin -EKG without apparent ischemic changes -Cardiology consult is pending -Continue Lipitor  HTN -Continue Avapro, Lopressor  Lung cancer -Stage 1B NSCLC in 2/20 -s/p R VATS with RLL superior segmentectomy and LN dissection in 3/20 -Currently on observation status -CT without clear concerns for recurrence -This is a small effusion and also LAD which appears to be reactive in nature -For now, continue outpatient onc f/u   Note: This patient has been tested and is negative for the novel coronavirus COVID-19.  DVT prophylaxis: Eliquis Code Status:  Full - based on prior admission Family Communication: I spoke with his wife twice by telephone  Disposition Plan:  Home once clinically improved Consults called: Cardiology; [place], SW  Admission status: Admit - It is my clinical opinion that admission to INPATIENT is reasonable and necessary because of the expectation that this patient will require hospital care that crosses at least 2 midnights to treat this condition based on the medical complexity of the problems presented.  Given the aforementioned information, the  predictability of an adverse outcome is felt to be significant.    Anderson Malta  Lorin Mercy MD [facility]   How to contact the [facility] Attending or Consulting provider [place] or covering provider during after hours [place], for this patient?  1. Check the care team in [facility] and look for a) attending/consulting TR[facility] provider listed and b) the [facility] team listed 2. Log into [email or web address] and use [facility]'s universal password to access. If you do not have the password, please contact the hospital operator. 3. Locate the [facility] provider you are looking for under [facility] and page to a number that you can be directly reached. 4. If you still have difficulty reaching the provider, please page the [facility] (Director on Call) for the Hospitalists listed on amion for assistance.   02/14/2019, 10:03 AM

## 2019-02-14 NOTE — ED Notes (Signed)
Patient transported to CT 

## 2019-02-14 NOTE — ED Notes (Signed)
Please call the wife Fraser Din at [phone] for an update

## 2019-02-14 NOTE — Consult Note (Addendum)
Cardiology Consultation:   Patient ID: [name] MRN: [id number]; DOB: March 02, 1941  Admit date: 02/14/2019 Date of Consult: 02/14/2019  Primary Care Provider: [name], MD Primary Cardiologist: Cristopher Peru, MD  Primary Electrophysiologist:  None    Patient Profile:   George Vega is a 78 y.o. male with a hx of atrial fibrillation, hyperlipidemia and diastolic CHF who is being seen today for the evaluation of shortness of breath at the request of Dr. Lorin Mercy.  History of Present Illness:   George Vega is a 78 year old male with past medical history noted above.  He has been followed by electrophysiology for atrial fibrillation and controlled with dofetilide.  He is on Xarelto for stroke risk reduction.  He was seen in the office on 01/06/2019 by Dr. Curt Bears at which time he was noted to be having more atrial fibrillation over the prior months.  He was planned for A. fib ablation however cardiac CT revealed a moderate left main, extensive plaque in the LAD which appeared to be subtotally occluded with probable moderate left circumflex disease and moderate to severe RCA disease.  He was seen via telehealth visit with Pecolia Ades on 01/13/2019 to discuss plans for cardiac catheterization.  Underwent cardiac cath on 01/16/2019 with 50% proximal circumflex lesion, mid to distal left main lesion of 25% in mid LAD lesion of 100%.  This was felt to be unchanged since his cath in 2013.  During cath patient went into A. fib RVR and was treated with metoprolol.  He was seen in the office by Dr. Curt Bears on 01/30/2019 decision was made to proceed with A. fib ablation.  He was continued on Xarelto and dofetilide.  Did develop hypotension in the setting of SVT post procedure and given IV fluids along with Lasix the morning of discharge.  Otherwise remained stable throughout admission and was discharged on 02/12/2019.  He presented to the ED on 02/14/2019 with shortness of breath, fever and hypoxia, as  well as confusion.  Somewhat difficult historian secondary to his confusion but wife reported coming home Thursday afternoon and feeling short of breath and wheezing.  Stated that this worsened throughout the night and had significant orthopnea and PND.  This was experienced throughout Friday into the evening and had episodic tachypnea and lightheadedness.   In the ED his labs showed stable electrolytes, creatinine 0.93, BNP 208, WBC 10.5, hemoglobin 11.2, high-sensitivity troponin 558>> 432.  EKG showed sinus rhythm, noted artifact but no significant ST/T wave abnormalities. There was reported stridor and he underwent CT of the neck and chest which did not show any significant abnormality.  Initially on nonrebreather on arrival but was able to be weaned to nasal cannula 4 L.  He was started on IV antibiotics and admitted to internal medicine further work-up.  Covid negative.  Heart Pathway Score:     Past Medical History:  Diagnosis Date   Arthritis    Atrial fibrillation, persistent ([place])    Chronic sinusitis    Coronary artery disease    Depression    Difficult intubation    was told with shoulder done 2006-alittle narrow   Gait disorder 05/28/2014   Hypertension    Jejunostomy tube fell out    when asked about this in 04/2017, pt denied ever having a J tube, feeding tube, tubes placed post surgery so ??? veracity of a previous J tube.     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   Spastic colon  Squamous cell carcinoma of lung, stage I, right ([place]) 05/07/2018   bx 04/29/18; isolated PET uptake in RLL mass   Stroke (cerebrum) ([facility])    SVT (supraventricular tachycardia) ([place])     Past Surgical History:  Procedure Laterality Date   ATRIAL FIBRILLATION ABLATION N/A 02/11/2019   Procedure: ATRIAL FIBRILLATION ABLATION;  Surgeon: Constance Haw, MD;  Location: [facility];  Service: Cardiovascular;  Laterality: N/A;   CARDIAC CATHETERIZATION      CATARACT EXTRACTION, BILATERAL     COLONOSCOPY WITH PROPOFOL N/A 04/17/2017   Procedure: COLONOSCOPY WITH PROPOFOL;  Surgeon: Yetta Flock, MD;  Location: [facility];  Service: Gastroenterology;  Laterality: N/A;   ESOPHAGEAL DILATION  2017   ESOPHAGOGASTRODUODENOSCOPY (EGD) WITH PROPOFOL N/A 04/17/2017   Procedure: ESOPHAGOGASTRODUODENOSCOPY (EGD) WITH PROPOFOL;  Surgeon: Yetta Flock, MD;  Location: [facility];  Service: Gastroenterology;  Laterality: N/A;   EYE SURGERY     FOOT NEUROMA SURGERY  2002   LEFT HEART CATH AND CORONARY ANGIOGRAPHY N/A 01/16/2019   Procedure: LEFT HEART CATH AND CORONARY ANGIOGRAPHY;  Surgeon: Jettie Booze, MD;  Location: [facility];  Service: Cardiovascular;  Laterality: N/A;   LEFT HEART CATHETERIZATION WITH CORONARY ANGIOGRAM Right 06/14/2011   20% LM, chronic occluded mid LAD, 50% ostial LCX, 20% mid RI, RCA with collaterals to mid LAD, mid 10% stenosis, EF 60% 06/14/11   NASAL SINUS SURGERY     SHOULDER ARTHROSCOPY  03/01/2011   Procedure: ARTHROSCOPY SHOULDER;  Surgeon: Ninetta Lights, MD;  Location: [place];  Service: Orthopedics;  Laterality: Right;  arthroscopy shoulder decompression subacromial partial acromioplasty with coracoacromial release, distal claviculectomy, debridement of labrium   SHOULDER ARTHROSCOPY  03/01/2011   Procedure: ARTHROSCOPY SHOULDER;  Surgeon: Ninetta Lights, MD;  Location: [facility];  Service: Orthopedics;  Laterality: Right;  arthroscopy shoulder decompression subacromial partial acromioplasty with coracoacromial release, distal claviculectomy, debridement of labrium   TEE WITHOUT CARDIOVERSION N/A 03/19/2016   Procedure: TRANSESOPHAGEAL ECHOCARDIOGRAM (TEE);  Surgeon: Larey Dresser, MD;  Location: [place];  Service: Cardiovascular;  Laterality: N/A;   TOTAL KNEE ARTHROPLASTY Right 09/23/2013   Procedure: TOTAL KNEE ARTHROPLASTY;  Surgeon: Ninetta Lights, MD;  Location: [place];  Service: Orthopedics;  Laterality: Right;   TOTAL SHOULDER ARTHROPLASTY  06/28/2011   Procedure: TOTAL SHOULDER ARTHROPLASTY;  Surgeon: Ninetta Lights, MD;  Location: [facility];  Service: Orthopedics;  Laterality: Right;   UVULOPALATOPHARYNGOPLASTY     VIDEO ASSISTED THORACOSCOPY (VATS)/WEDGE RESECTION Right 06/02/2018   Procedure: VIDEO ASSISTED THORACOSCOPY (VATS)/WEDGE RESECTION with Lymph node disection and intercostal nerve block.;  Surgeon: [name], MD;  Location: [facility];  Service: Thoracic;  Laterality: Right;     Home Medications:  Prior to Admission medications   Medication Sig Start Date End Date Taking? Authorizing Provider  acetaminophen (TYLENOL) 325 MG tablet Take 650 mg by mouth every 6 (six) hours as needed for moderate pain.   Yes [provider]  apixaban (ELIQUIS) 5 MG TABS tablet Take 1 tablet (5 mg total) by mouth 2 (two) times daily. 02/12/19  Yes Shirley Friar, PA-C  Cholecalciferol (VITAMIN D3) 5000 units TABS Take 5,000 Units by mouth daily.   Yes [provider]  dofetilide (TIKOSYN) 250 MCG capsule TAKE 1 CAPSULE BY MOUTH TWO TIMES DAILY Patient taking differently: Take 250 mcg by mouth 2 (two) times daily.  01/16/19  Yes Evans Lance, MD  ferrous  sulfate (FEROSUL) 325 (65 FE) MG tablet Take 1 tablet (325 mg total) by mouth 2 (two) times daily with a meal. 06/06/18  Yes Tacy Dura, Donielle M, PA-C  fluticasone (FLONASE) 50 MCG/ACT nasal spray Place 1-2 sprays into both nostrils daily as needed for allergies or rhinitis.   Yes [provider]  irbesartan (AVAPRO) 150 MG tablet TAKE 1 TABLET(150 MG) BY MOUTH DAILY Patient taking differently: Take 150 mg by mouth daily. TAKE 1 TABLET(150 MG) BY MOUTH DAILY 11/25/18  Yes Evans Lance, MD  magnesium oxide (MAG-OX) 400 MG tablet Take 400 mg by mouth daily.   Yes [provider]  Melatonin 10 MG TABS Take 10 mg by mouth  at bedtime as needed (sleep).    Yes [provider]  metoprolol tartrate (LOPRESSOR) 25 MG tablet TAKE 1 TABLET BY MOUTH 2  TIMES DAILY. Patient taking differently: Take 25 mg by mouth 2 (two) times daily.  08/19/18  Yes Evans Lance, MD  metoprolol tartrate (LOPRESSOR) 25 MG tablet Take 1-2 tablets (25-50 mg total) by mouth daily as needed (for breakthrough palpitations). 11/20/18 02/18/19 Yes Evans Lance, MD  Multiple Vitamins-Minerals (PRESERVISION AREDS 2 PO) Take 1 capsule by mouth 2 (two) times daily.   Yes [provider]  pantoprazole (PROTONIX) 40 MG tablet Take 40 mg by mouth daily.  02/22/17  Yes [provider]  polycarbophil (FIBERCON) 625 MG tablet Take 1,250 mg by mouth at bedtime.    Yes [provider]  Polyethyl Glycol-Propyl Glycol (SYSTANE OP) Place 2 drops into both eyes 2 (two) times daily as needed (dry eyes).    Yes [provider]  rosuvastatin (CRESTOR) 5 MG tablet TAKE 1 TABLET BY MOUTH  EVERY MORNING Patient taking differently: Take 5 mg by mouth daily.  10/23/18  Yes Evans Lance, MD  tamsulosin (FLOMAX) 0.4 MG CAPS capsule Take 0.4 mg by mouth at bedtime.  12/04/16  Yes [provider]  furosemide (LASIX) 40 MG tablet TAKE 1 TABLET(40 MG) BY MOUTH DAILY Patient taking differently: Take 40 mg by mouth 3 (three) times a week. Tuesday Thursday Saturday of each week 01/08/18   Evans Lance, MD  potassium chloride (K-DUR) 10 MEQ tablet TAKE 1 TABLET BY MOUTH EVERY DAY FOR 3 DAYS. THEN TAKE ONLY WHEN TAKING LASIX AS DIRECTED. Patient taking differently: Take 10 mEq by mouth 3 (three) times a week. Tuesday,Thursday & Saturday of each week. ( pt takes with Lasix) 11/25/18   Evans Lance, MD    Inpatient Medications: Scheduled Meds:  apixaban  5 mg Oral BID   dofetilide  250 mcg Oral BID   folic acid  1 mg Oral Daily   furosemide  40 mg Intravenous Q12H   irbesartan  150 mg Oral Daily   magnesium oxide   400 mg Oral Daily   metoprolol tartrate  25 mg Oral BID   multivitamin with minerals  1 tablet Oral Daily   pantoprazole  40 mg Oral Daily   PARoxetine  30 mg Oral QHS   polycarbophil  1,250 mg Oral QHS   rosuvastatin  5 mg Oral Daily   sodium chloride flush  3 mL Intravenous Q12H   tamsulosin  0.4 mg Oral QHS   thiamine  100 mg Oral Daily   Or   thiamine  100 mg Intravenous Daily   Continuous Infusions:  sodium chloride     ceFEPime (MAXIPIME) IV     PRN Meds: sodium chloride, acetaminophen,  albuterol, LORazepam **OR** LORazepam, Melatonin, ondansetron (ZOFRAN) IV, Polyethyl Glycol-Propyl Glycol, sodium chloride flush  Allergies:    Allergies  Allergen Reactions   Glucosamine-Chondroitin Anaphylaxis and Other (See Comments)    Stomach cramps, can't eat    Tetanus Toxoid Anaphylaxis and Hives    hives/throat swells shut    Fish Oil Other (See Comments)    Stomach cramps, can't eat    Ibuprofen Nausea Only and Other (See Comments)    Stomach pains    Naproxen Diarrhea    Social History:   Social History   Socioeconomic History   Marital status: Married    Spouse name: Not on file   Number of children: 3   Years of education: Not on file   Highest education level: Not on file  Occupational History   Occupation: retired  Scientist, product/process development strain: Not on file   Food insecurity    Worry: Not on file    Inability: Not on Lexicographer needs    Medical: Not on file    Non-medical: Not on file  Tobacco Use   Smoking status: Former Smoker    Packs/day: 2.00    Years: 30.00    Pack years: 60.00    Types: Cigarettes    Quit date: 02/27/1998    Years since quitting: 20.9   Smokeless tobacco: Never Used  Substance and Sexual Activity   Alcohol use: Yes    Alcohol/week: 14.0 standard drinks    Types: 14 Standard drinks or equivalent per week    Comment: 2 drinks daily ([place])   Drug use: No   Sexual  activity: Not on file  Lifestyle   Physical activity    Days per week: Not on file    Minutes per session: Not on file   Stress: Not on file  Relationships   Social connections    Talks on phone: Not on file    Gets together: Not on file    Attends religious service: Not on file    Active member of club or organization: Not on file    Attends meetings of clubs or organizations: Not on file    Relationship status: Not on file   Intimate partner violence    Fear of current or ex partner: Not on file    Emotionally abused: Not on file    Physically abused: Not on file    Forced sexual activity: Not on file  Other Topics Concern   Not on file  Social History Narrative   Patient is left handed.   Patient drinks one cup caffeine daily.    Family History:    Family History  Problem Relation Age of Onset   Cancer Mother        colon   Heart attack Father    Migraines Brother      ROS:  Please see the history of present illness.   All other ROS reviewed and negative.     Physical Exam/Data:   Vitals:   02/14/19 1115 02/14/19 1130 02/14/19 1203 02/14/19 1204  BP: (!) 133/92 125/75 111/78   Pulse: 90 84 79 84  Resp: (!) 27 (!) 24  (!) 22  Temp:      TempSrc:      SpO2: 96% 98% 98% 100%    Intake/Output Summary (Last 24 hours) at 02/14/2019 1325 Last data filed at 02/14/2019 1209 Gross per 24 hour  Intake --  Output 1900 ml  Net -1900  ml   Last 3 Weights 02/12/2019 02/11/2019 01/30/2019  Weight (lbs) 219 lb 12.8 oz 212 lb 213 lb  Weight (kg) 99.701 kg 96.163 kg 96.616 kg     There is no height or weight on file to calculate BMI.   Physical Exam  Patient does not seem to be in acute distress Unable to evaluate JVD secondary to large neck. S1-S2 no S4. Crackles at both lung bases Nondistended abdomen No lower extremity edema.  EKG:  The EKG was personally reviewed and demonstrates: Sinus rhythm, artifact noted but no significant ST/T wave  abnormalities.  Relevant CV Studies:  TTE: 02/14/2019  IMPRESSIONS   1. Left ventricular ejection fraction, by visual estimation, is 60 to 65%. The left ventricle has normal function. There is no left ventricular hypertrophy.  2. Left ventricular diastolic parameters are consistent with Grade I diastolic dysfunction (impaired relaxation).  3. Global right ventricle has normal systolic function.The right ventricular size is normal. No increase in right ventricular wall thickness.  4. Left atrial size was normal.  5. Right atrial size was normal.  6. The mitral valve is normal in structure. Mild mitral valve regurgitation. No evidence of mitral stenosis.  7. The tricuspid valve is normal in structure. Tricuspid valve regurgitation is mild.  8. The aortic valve is normal in structure. Aortic valve regurgitation is not visualized. No evidence of aortic valve sclerosis or stenosis.  9. The pulmonic valve was normal in structure. Pulmonic valve regurgitation is not visualized. 10. Mildly elevated pulmonary artery systolic pressure. 11. The inferior vena cava is normal in size with greater than 50% respiratory variability, suggesting right atrial pressure of 3 mmHg.  Cath: 01/16/2019   Ost Cx to Prox Cx lesion is 50% stenosed.  Mid LM to Dist LM lesion is 25% stenosed.  Mid LAD lesion is 100% stenosed.  LV end diastolic pressure is normal.  There is no aortic valve stenosis.   No change in anatomy since 2013 cath.  Prior films reviewed.    Discussed with Dr. Curt Bears.  Patient went into AFib with RVR.  Treated with metoprolol.  Despite some hypotension, he remained awake, alert and asymptomatic, except for some mild sensation of fluttering in his chest.    He will be watched in the holding area.   Diagnostic Dominance: Right    Laboratory Data:  High Sensitivity Troponin:   Recent Labs  Lab 02/14/19 0544 02/14/19 0941 02/14/19 1103  TROPONINIHS 558* 432* 379*      Chemistry Recent Labs  Lab 02/12/19 0731 02/14/19 0245 02/14/19 0812  NA 139 140 138  K 4.0 4.0 4.2  CL 107 107  --   CO2 22 25  --   GLUCOSE 135* 127*  --   BUN 16 15  --   CREATININE 1.03 0.93  --   CALCIUM 8.4* 8.9  --   GFRNONAA >60 >60  --   GFRAA >60 >60  --   ANIONGAP 10 8  --     Recent Labs  Lab 02/14/19 0245  PROT 6.0*  ALBUMIN 3.6  AST 66*  ALT 64*  ALKPHOS 115  BILITOT 0.7   Hematology Recent Labs  Lab 02/14/19 0245 02/14/19 0812  WBC 10.5  --   RBC 3.50*  --   HGB 11.2* 11.2*  HCT 34.8* 33.0*  MCV 99.4  --   MCH 32.0  --   MCHC 32.2  --   RDW 13.2  --   PLT 148*  --  BNP Recent Labs  Lab 02/14/19 0245  BNP 208.6*    DDimer No results for input(s): DDIMER in the last 168 hours.   Radiology/Studies:  Dg Neck Soft Tissue  Result Date: 02/14/2019 CLINICAL DATA:  Initial evaluation for acute stridor, shortness of breath. EXAM: NECK SOFT TISSUES - 1+ VIEW COMPARISON:  None. FINDINGS: Epiglottis within normal limits. Question mild retropharyngeal/prevertebral soft tissue fullness at the level of C5-6, measuring up to approximately 2 cm. No retropharyngeal gas. Small linear radiopaque density seen just anterior to the C3 vertebral body felt to be related to thyroid or cricoid calcification. No radiopaque foreign body. Advanced multilevel degenerative spondylosis, most notable at C3-4. Remainder of the visualized soft tissues of the neck demonstrate no acute finding. Probable vascular calcifications overlie the region of both carotid bifurcations. Scattered peribronchial thickening seen within the visualized upper lungs. Probable superimposed right upper lobe atelectatic changes. IMPRESSION: 1. Question mild retropharyngeal soft tissue fullness at the level of C5-6, which could reflect edema and/or effusion. No frank retropharyngeal gas. Normal epiglottis. 2. Aortic Atherosclerosis (ICD10-I70.0). 3. Moderate to advanced multilevel cervical spondylosis,  most notable at C3-4. Results discussed by telephone at the time of interpretation on 02/14/2019 at 3 STIR:41 am to [place]. Electronically Signed   By: Jeannine Boga M.D.   On: 02/14/2019 03:42   Ct Soft Tissue Neck W Contrast  Result Date: 02/14/2019 CLINICAL DATA:  Initial evaluation for acute sore throat, stridor. EXAM: CT NECK WITH CONTRAST TECHNIQUE: Multidetector CT imaging of the neck was performed using the standard protocol following the bolus administration of intravenous contrast. CONTRAST:  141mL OMNIPAQUE IOHEXOL 350 MG/ML SOLN COMPARISON:  Prior radiograph from earlier same day. FINDINGS: Pharynx and larynx: Oral cavity within normal limits without discrete mass or collection. No acute abnormality about the dentition. Palatine tonsils symmetric and within normal limits. Few punctate calcified tonsilliths noted on the right. Parapharyngeal fat maintained. Nasopharynx and the remainder of the oropharynx within normal limits. Epiglottis normal. Vallecula clear. Remainder of the hypopharynx and supraglottic larynx within normal limits. No retropharyngeal collection, effusion, or significant soft tissue swelling. Previously question prevertebral/retropharyngeal fullness likely related to positioning and/or summation of shadows on prior radiograph. True cords symmetric and normal. Subglottic airway widely patent and clear. Salivary glands: Salivary glands including the parotid and submandibular glands within normal limits. Thyroid: Thyroid within normal limits. No discrete thyroid nodule or mass. Lymph nodes: No pathologically enlarged lymph nodes identified within the neck. Vascular: Moderate atherosclerotic change present about the aortic arch, carotid bifurcations, and carotid siphons. Normal intravascular enhancement seen throughout the neck. Limited intracranial: Age-related cerebral atrophy. Otherwise unremarkable. Visualized orbits: Globes and orbital soft tissues within normal  limits. Mastoids and visualized paranasal sinuses: Chronic mucosal thickening noted within the ethmoidal air cells, sphenoid sinuses, and maxillary sinuses. Superimposed air-fluid levels noted within the sphenoid sinuses, suggesting acute on chronic disease. Mastoid air cells and middle ear cavities well pneumatized and free of fluid. Skeleton: No acute osseous abnormality. No discrete lytic or blastic osseous lesions. Moderate multilevel cervical spondylosis, most notable at C3-4 and C5-6. Upper chest: Small layering bilateral pleural effusions, right greater than left, partially visualized. Associated scattered atelectatic changes seen within the visualized lungs. Partially visualized upper chest otherwise demonstrates no acute finding. Other: None. IMPRESSION: 1. Negative CT of the neck. No discrete mass or collection identified. Previously questioned prevertebral/retropharyngeal fullness likely related to positioning and/or summation of shadows on prior radiograph. 2. Small layering bilateral pleural effusions, right greater than left, partially  visualized. 3. Chronic pan sinusitis with superimposed air-fluid levels within the sphenoid sinuses, suggesting acute on chronic disease. 4.  Aortic Atherosclerosis (ICD10-I70.0). Electronically Signed   By: Jeannine Boga M.D.   On: 02/14/2019 05:22   Ct Angio Chest Pe W And/or Wo Contrast  Result Date: 02/14/2019 CLINICAL DATA:  Short of breath, recent cardiac ablation for supraventricular tachycardia. EXAM: CT ANGIOGRAPHY CHEST WITH CONTRAST TECHNIQUE: Multidetector CT imaging of the chest was performed using the standard protocol during bolus administration of intravenous contrast. Multiplanar CT image reconstructions and MIPs were obtained to evaluate the vascular anatomy. CONTRAST:  175mL OMNIPAQUE IOHEXOL 350 MG/ML SOLN COMPARISON:  Chest CT 01/02/2019 FINDINGS: Cardiovascular: No filling defects within the pulmonary arteries to suggest acute pulmonary  embolism. No acute findings of the aorta or great vessels. No pericardial fluid. Coronary artery calcification and aortic atherosclerotic calcification. Mediastinum/Nodes: No axillary supraclavicular adenopathy. Mild RIGHT hilar adenopathy with a 14 mm hilar lymph node. Lungs/Pleura: New small RIGHT pleural effusion. Small focus of consolidation at the RIGHT lung base measuring 2.5 cm (image 94/6). There is mild interlobular septal thickening in the lungs. Mild ground-glass pattern lungs additionally. Upper Abdomen: Limited view of the liver, kidneys, pancreas are unremarkable. Normal adrenal glands. Nonobstructing calculus in the upper pole of the LEFT kidney measuring 3 mm. Musculoskeletal: No aggressive osseous lesion Review of the MIP images confirms the above findings. IMPRESSION: 1. No evidence acute pulmonary embolism. 2. New small RIGHT pleural effusion.  Mild pulmonary edema pattern. 3. Small focus of consolidation at the RIGHT lung base is favored atelectasis. Cannot exclude small focus of infection. Infarction unlikely. 4. Mild RIGHT hilar adenopathy is likely reactive. 5. Aortic Atherosclerosis (ICD10-I70.0). Electronically Signed   By: Suzy Bouchard M.D.   On: 02/14/2019 05:08   Dg Chest Portable 1 View  Result Date: 02/14/2019 CLINICAL DATA:  Initial evaluation for acute shortness of breath. EXAM: PORTABLE CHEST 1 VIEW COMPARISON:  Prior radiograph from 07/24/2018. FINDINGS: Mild cardiomegaly, stable. Mediastinal silhouette within normal limits. Aortic atherosclerosis. Lungs hypoinflated with elevation of the right hemidiaphragm, similar to previous. Suture line overlies the mid-lower right lung. Interval increase in prominence of the interstitial and peribronchial markings, which could reflect acute bronchiolitis or possibly mild interstitial congestion/edema. Blunting of the right costophrenic angle could reflect small effusion versus chronic pleural reaction/scarring, similar to previous.  Irregular scarring and/or atelectatic changes present within the mid and lower right lung as well. No other consolidative airspace disease. No pneumothorax. No acute osseous finding. Bilateral shoulder arthroplasties noted. IMPRESSION: 1. Interval increase in prominence of the interstitial and peribronchial markings, which could reflect acute bronchiolitis or possibly mild interstitial congestion/edema. 2. Blunting of the right costophrenic angle, which could reflect a small right pleural effusion versus chronic pleural reaction/scarring. 3. Additional scarring and/or atelectatic changes within the mid and lower right lung, stable. 4. Stable cardiomegaly with aortic atherosclerosis. Electronically Signed   By: Jeannine Boga M.D.   On: 02/14/2019 03:45   TTE: 02/14/2019  1. Left ventricular ejection fraction, by visual estimation, is 60 to 65%. The left ventricle has normal function. There is no left ventricular hypertrophy.  2. Left ventricular diastolic parameters are consistent with Grade I diastolic dysfunction (impaired relaxation).  3. Global right ventricle has normal systolic function.The right ventricular size is normal. No increase in right ventricular wall thickness.  4. Left atrial size was normal.  5. Right atrial size was normal.  6. The mitral valve is normal in structure. Mild mitral valve regurgitation.  No evidence of mitral stenosis.  7. The tricuspid valve is normal in structure. Tricuspid valve regurgitation is mild.  8. The aortic valve is normal in structure. Aortic valve regurgitation is not visualized. No evidence of aortic valve sclerosis or stenosis.  9. The pulmonic valve was normal in structure. Pulmonic valve regurgitation is not visualized. 10. Mildly elevated pulmonary artery systolic pressure. 11. The inferior vena cava is normal in size with greater than 50% respiratory variability, suggesting right atrial pressure of 3 mmHg.   Assessment and Plan:   DEVARION MCCLANAHAN  is a 78 y.o. male with a hx of atrial fibrillation, hyperlipidemia and diastolic CHF who is being seen today for the evaluation of shortness of breath at the request of Dr. Lorin Mercy  1.  Acute on chronic diastolic CHF, LVEF 55 to 15% -The patient came 3 kg during the last hospitalization most probably secondary to holding Lasix at the time of procedure.  He is diuresed 1 and half liter after the first dose of Lasix, will continue with 40 mg IV every 12 hours.  His saturations are now 99% on room air, he will most probably be able to be discharged tomorrow.  2. Acute respiratory failure with hypoxia: Secondary to CHF  3.  Persistent atrial fibrillation s/p A. fib ablation: Performed by Dr. Curt Bears on 02/12/2019.  EKG shows sinus rhythm on admission.  Chest CT does not show any signs of fistula or perforation. --On Eliquis 5 mg twice daily and dofetilide 250 mcg twice daily  4.  CAD: Underwent cath 01/16/2019 with known CTO of the LAD and mild nonobstructive disease in left main and circumflex.  This was unchanged from prior cath in 2013.  Recommendations to continue medical management.  Troponin mildly elevated secondary to CHF, no heparin as he is on Eliquis.  5.  Altered mental status: Per wife denies any history of dementia.  According to notes there are some concern about possible EtOH withdrawal.  He has been started on CIWA protocol.   We will follow.  For questions or updates, please contact [facility] Please consult [email or web address] for contact info under   Signed, Ena Dawley, MD  02/14/2019 1:25 PM

## 2019-02-14 NOTE — ED Notes (Signed)
Secretary to page hospitalist regarding elevated troponin

## 2019-02-14 NOTE — ED Provider Notes (Signed)
Emergency Department Provider Note   I have reviewed the triage vital signs and the nursing notes.   HISTORY  Chief Complaint Shortness of Breath   HPI George Vega is a 78 y.o. male who presents to the ED for sob, fever and hypoxia. Patient was intubated for general anesthesia on Wednesday for ablation, did fine since then. Tonight woke up acutely dyspneic. Ems called and patient with stridor, sats of 82 and warm skin. Did better with nebulized saline.    No other associated or modifying symptoms.    Past Medical History:  Diagnosis Date   Arthritis    Atrial fibrillation, persistent ([place])    Chronic sinusitis    Coronary artery disease    Depression    Difficult intubation    was told with shoulder done 2006-alittle narrow   Dysrhythmia    A. Fib   Gait disorder 05/28/2014   Hypertension    Jejunostomy tube fell out    when asked about this in 04/2017, pt denied ever having a J tube, feeding tube, tubes placed post surgery so ??? veracity of a previous J tube.     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   AVM (arteriovenous malformation) of colon without hemorrhage    Gastric AVM    Chronic anticoagulation    Iron deficiency anemia due to chronic blood loss    Symptomatic anemia 04/16/2017   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   PD (perceptive deafness), 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   Prediabetes 01/30/2017   Vitamin D deficiency 01/30/2017    Multiple pulmonary nodules determined by computed tomography of lung 10/09/2016   Pulmonary nodules 10/09/2016   Persistent atrial fibrillation ([place]) 03/19/2016   Upper airway cough syndrome 01/21/2016   Dyspnea on exertion 01/20/2016   SVT (supraventricular tachycardia) ([place]) 11/01/2015   Atrial tachycardia ([place]) 09/08/2015   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   Gait disorder 05/28/2014   BPH (benign prostatic hyperplasia) 05/19/2014   CVA (cerebral vascular accident) ([place]) 02/01/2014   Benign paroxysmal positional vertigo 01/12/2014   Headache 01/12/2014   DTs (delirium tremens) ([place]) 09/26/2013   DJD (degenerative joint disease) of knee 09/23/2013   Ataxia 04/02/2013   Leukocytosis 04/02/2013   CAD (coronary artery disease) 04/02/2013   Hyperlipemia 04/02/2013   HTN (hypertension) 04/02/2013   Abnormal MRA, brain 11/06/2012   Arthralgia 09/02/2012   Synovitis of wrist 07/25/2012   OA (osteoarthritis) 07/25/2012   Spastic colon 05/03/2011   Arthritis 05/03/2011   Coronary atherosclerosis 11/04/2008   DEPRESSION 11/03/2008   DYSTHYMIC DISORDER 10/13/2008   STRESS ELECTROCARDIOGRAM, ABNORMAL 10/13/2008    Past Surgical History:  Procedure Laterality Date   ATRIAL FIBRILLATION ABLATION N/A 02/11/2019   Procedure: ATRIAL FIBRILLATION ABLATION;  Surgeon: Constance Haw, MD;  Location: [place] [facility];  Service: Cardiovascular;  Laterality: N/A;   CARDIAC CATHETERIZATION     CATARACT EXTRACTION, BILATERAL     COLONOSCOPY WITH PROPOFOL N/A 04/17/2017  Procedure: COLONOSCOPY WITH PROPOFOL;  Surgeon: Yetta Flock, MD;  Location: [place];  Service: Gastroenterology;  Laterality: N/A;   ESOPHAGEAL DILATION  2017   ESOPHAGOGASTRODUODENOSCOPY (EGD) WITH PROPOFOL N/A 04/17/2017   Procedure: ESOPHAGOGASTRODUODENOSCOPY (EGD) WITH PROPOFOL;  Surgeon: Yetta Flock, MD;  Location: [place];  Service:  Gastroenterology;  Laterality: N/A;   EYE SURGERY     FOOT NEUROMA SURGERY  2002   LEFT HEART CATH AND CORONARY ANGIOGRAPHY N/A 01/16/2019   Procedure: LEFT HEART CATH AND CORONARY ANGIOGRAPHY;  Surgeon: Jettie Booze, MD;  Location: [place] [facility];  Service: Cardiovascular;  Laterality: N/A;   LEFT HEART CATHETERIZATION WITH CORONARY ANGIOGRAM Right 06/14/2011   20% LM, chronic occluded mid LAD, 50% ostial LCX, 20% mid RI, RCA with collaterals to mid LAD, mid 10% stenosis, EF 60% 06/14/11   NASAL SINUS SURGERY     SHOULDER ARTHROSCOPY  03/01/2011   Procedure: ARTHROSCOPY SHOULDER;  Surgeon: Ninetta Lights, MD;  Location: [place];  Service: Orthopedics;  Laterality: Right;  arthroscopy shoulder decompression subacromial partial acromioplasty with coracoacromial release, distal claviculectomy, debridement of labrium   SHOULDER ARTHROSCOPY  03/01/2011   Procedure: ARTHROSCOPY SHOULDER;  Surgeon: Ninetta Lights, MD;  Location: [place];  Service: Orthopedics;  Laterality: Right;  arthroscopy shoulder decompression subacromial partial acromioplasty with coracoacromial release, distal claviculectomy, debridement of labrium   TEE WITHOUT CARDIOVERSION N/A 03/19/2016   Procedure: TRANSESOPHAGEAL ECHOCARDIOGRAM (TEE);  Surgeon: Larey Dresser, MD;  Location: [place];  Service: Cardiovascular;  Laterality: N/A;   TOTAL KNEE ARTHROPLASTY Right 09/23/2013   Procedure: TOTAL KNEE ARTHROPLASTY;  Surgeon: Ninetta Lights, MD;  Location: [place];  Service: Orthopedics;  Laterality: Right;   TOTAL SHOULDER ARTHROPLASTY  06/28/2011   Procedure: TOTAL SHOULDER ARTHROPLASTY;  Surgeon: Ninetta Lights, MD;  Location: [place];  Service: Orthopedics;  Laterality: Right;   UVULOPALATOPHARYNGOPLASTY     VIDEO ASSISTED THORACOSCOPY (VATS)/WEDGE RESECTION Right 06/02/2018   Procedure: VIDEO ASSISTED THORACOSCOPY (VATS)/WEDGE RESECTION with Lymph node  disection and intercostal nerve block.;  Surgeon: [name], MD;  Location: [place];  Service: Thoracic;  Laterality: Right;    Current Outpatient Rx   Order #: [id number] Class: Historical Med   Order #: [id number] Class: Normal   Order #: [id number] Class: Historical Med   Order #: [id number] Class: Historical Med   Order #: [id number] Class: Normal   Order #: [id number] Class: No Print   Order #: [id number] Class: Historical Med   Order #: [id number] Class: Normal   Order #: [id number] Class: Normal   Order #: [id number] Class: Historical Med   Order #: [id number] Class: Historical Med   Order #: [id number] Class: Normal   Order #: [id number] Class: Normal   Order #: [id number] Class: Historical Med   Order #: [id number] Class: Historical Med   Order #: [id number] Class: Historical Med   Order #: [id number] Class: Historical Med   Order #: [id number] Class: Historical Med   Order #: [id number] Class: Normal   Order #: [id number] Class: Normal   Order #: [id number] Class: Historical Med    Allergies Glucosamine-chondroitin, Tetanus toxoid, Fish oil, Ibuprofen, and Naproxen  Family History  Problem Relation Age of Onset   Cancer Mother        colon   Heart attack Father    Migraines Brother     Social History Social History   Tobacco Use   Smoking status: Former Smoker    Packs/day: 2.00    Years: 30.00    Pack years: 60.00  Types: Cigarettes    Quit date: 02/27/1998    Years since quitting: 20.9   Smokeless tobacco: Never Used  Substance Use Topics   Alcohol use: Yes    Alcohol/week: 14.0 standard drinks    Types: 14 Standard drinks or equivalent per week    Comment: 2 drinks daily ([place])   Drug use: No    Review of Systems  All other systems negative except as documented in the HPI. All pertinent positives and negatives as reviewed in the HPI. ____________________________________________   PHYSICAL EXAM:  VITAL SIGNS: ED Triage Vitals  Enc Vitals  Group     BP 02/14/19 0300 (!) 154/91     Pulse Rate 02/14/19 0244 85     Resp 02/14/19 0244 (!) 30     Temp 02/14/19 0244 (!) 100.4 F (38 C)     Temp Source 02/14/19 0244 Axillary     SpO2 02/14/19 0244 100 %    Constitutional: Alert and oriented. Well appearing and in no acute distress. Eyes: Conjunctivae are normal. PERRL. EOMI. Head: Atraumatic. Nose: No congestion/rhinnorhea. Mouth/Throat: Mucous membranes are moist.  Oropharynx non-erythematous. Neck: No stridor.  No meningeal signs.   Cardiovascular: Normal rate, regular rhythm. Good peripheral circulation. Grossly normal heart sounds.   Respiratory: tachypneic respiratory effort, retractions, access muscle use, stridor (mostly expiratory), decreased lung sounds bilaterally Gastrointestinal: Soft and nontender. No distention.  Musculoskeletal: No lower extremity tenderness nor edema. No gross deformities of extremities. Neurologic:  Normal speech and language. No gross focal neurologic deficits are appreciated.  Skin:  Skin is warm, dry and intact. No rash noted.  ____________________________________________   LABS (all labs ordered are listed, but only abnormal results are displayed)  Labs Reviewed  CBC WITH DIFFERENTIAL/PLATELET - Abnormal; Notable for the following components:      Result Value   RBC 3.50 (*)    Hemoglobin 11.2 (*)    HCT 34.8 (*)    Platelets 148 (*)    All other components within normal limits  COMPREHENSIVE METABOLIC PANEL - Abnormal; Notable for the following components:   Glucose, Bld 127 (*)    Total Protein 6.0 (*)    AST 66 (*)    ALT 64 (*)    All other components within normal limits  PROTIME-INR - Abnormal; Notable for the following components:   Prothrombin Time 17.7 (*)    INR 1.5 (*)    All other components within normal limits  BRAIN NATRIURETIC PEPTIDE - Abnormal; Notable for the following components:   B Natriuretic Peptide 208.6 (*)    All other components within normal  limits  TROPONIN I (HIGH SENSITIVITY) - Abnormal; Notable for the following components:   Troponin I (High Sensitivity) 558 (*)    All other components within normal limits  CULTURE, BLOOD (ROUTINE X 2)  CULTURE, BLOOD (ROUTINE X 2)  URINE CULTURE  SARS CORONAVIRUS 2 (TAT 6-24 HRS)  LACTIC ACID, PLASMA  LACTIC ACID, PLASMA  APTT  URINALYSIS, ROUTINE W REFLEX MICROSCOPIC   ____________________________________________  EKG   EKG Interpretation  Date/Time:  Saturday February 14 2019 02:45:54 EST Ventricular Rate:  86 PR Interval:    QRS Duration: 101 QT Interval:  395 QTC Calculation: 473 R Axis:   15 Text Interpretation: Sinus rhythm No significant change since last tracing Confirmed by Merrily Pew [id number]) on 02/14/2019 3:09:50 AM       ____________________________________________  RADIOLOGY  Dg Neck Soft Tissue  Result Date: 02/14/2019 CLINICAL DATA:  Initial evaluation for  acute stridor, shortness of breath. EXAM: NECK SOFT TISSUES - 1+ VIEW COMPARISON:  None. FINDINGS: Epiglottis within normal limits. Question mild retropharyngeal/prevertebral soft tissue fullness at the level of C5-6, measuring up to approximately 2 cm. No retropharyngeal gas. Small linear radiopaque density seen just anterior to the C3 vertebral body felt to be related to thyroid or cricoid calcification. No radiopaque foreign body. Advanced multilevel degenerative spondylosis, most notable at C3-4. Remainder of the visualized soft tissues of the neck demonstrate no acute finding. Probable vascular calcifications overlie the region of both carotid bifurcations. Scattered peribronchial thickening seen within the visualized upper lungs. Probable superimposed right upper lobe atelectatic changes. IMPRESSION: 1. Question mild retropharyngeal soft tissue fullness at the level of C5-6, which could reflect edema and/or effusion. No frank retropharyngeal gas. Normal epiglottis. 2. Aortic Atherosclerosis  (ICD10-I70.0). 3. Moderate to advanced multilevel cervical spondylosis, most notable at C3-4. Results discussed by telephone at the time of interpretation on 02/14/2019 at 3 STIR:41 am to [place]. Electronically Signed   By: Jeannine Boga M.D.   On: 02/14/2019 03:42   Ct Soft Tissue Neck W Contrast  Result Date: 02/14/2019 CLINICAL DATA:  Initial evaluation for acute sore throat, stridor. EXAM: CT NECK WITH CONTRAST TECHNIQUE: Multidetector CT imaging of the neck was performed using the standard protocol following the bolus administration of intravenous contrast. CONTRAST:  164mL OMNIPAQUE IOHEXOL 350 MG/ML SOLN COMPARISON:  Prior radiograph from earlier same day. FINDINGS: Pharynx and larynx: Oral cavity within normal limits without discrete mass or collection. No acute abnormality about the dentition. Palatine tonsils symmetric and within normal limits. Few punctate calcified tonsilliths noted on the right. Parapharyngeal fat maintained. Nasopharynx and the remainder of the oropharynx within normal limits. Epiglottis normal. Vallecula clear. Remainder of the hypopharynx and supraglottic larynx within normal limits. No retropharyngeal collection, effusion, or significant soft tissue swelling. Previously question prevertebral/retropharyngeal fullness likely related to positioning and/or summation of shadows on prior radiograph. True cords symmetric and normal. Subglottic airway widely patent and clear. Salivary glands: Salivary glands including the parotid and submandibular glands within normal limits. Thyroid: Thyroid within normal limits. No discrete thyroid nodule or mass. Lymph nodes: No pathologically enlarged lymph nodes identified within the neck. Vascular: Moderate atherosclerotic change present about the aortic arch, carotid bifurcations, and carotid siphons. Normal intravascular enhancement seen throughout the neck. Limited intracranial: Age-related cerebral atrophy. Otherwise  unremarkable. Visualized orbits: Globes and orbital soft tissues within normal limits. Mastoids and visualized paranasal sinuses: Chronic mucosal thickening noted within the ethmoidal air cells, sphenoid sinuses, and maxillary sinuses. Superimposed air-fluid levels noted within the sphenoid sinuses, suggesting acute on chronic disease. Mastoid air cells and middle ear cavities well pneumatized and free of fluid. Skeleton: No acute osseous abnormality. No discrete lytic or blastic osseous lesions. Moderate multilevel cervical spondylosis, most notable at C3-4 and C5-6. Upper chest: Small layering bilateral pleural effusions, right greater than left, partially visualized. Associated scattered atelectatic changes seen within the visualized lungs. Partially visualized upper chest otherwise demonstrates no acute finding. Other: None. IMPRESSION: 1. Negative CT of the neck. No discrete mass or collection identified. Previously questioned prevertebral/retropharyngeal fullness likely related to positioning and/or summation of shadows on prior radiograph. 2. Small layering bilateral pleural effusions, right greater than left, partially visualized. 3. Chronic pan sinusitis with superimposed air-fluid levels within the sphenoid sinuses, suggesting acute on chronic disease. 4.  Aortic Atherosclerosis (ICD10-I70.0). Electronically Signed   By: Jeannine Boga M.D.   On: 02/14/2019 05:22   Ct Angio Chest  Pe W And/or Wo Contrast  Result Date: 02/14/2019 CLINICAL DATA:  Short of breath, recent cardiac ablation for supraventricular tachycardia. EXAM: CT ANGIOGRAPHY CHEST WITH CONTRAST TECHNIQUE: Multidetector CT imaging of the chest was performed using the standard protocol during bolus administration of intravenous contrast. Multiplanar CT image reconstructions and MIPs were obtained to evaluate the vascular anatomy. CONTRAST:  18mL OMNIPAQUE IOHEXOL 350 MG/ML SOLN COMPARISON:  Chest CT 01/02/2019 FINDINGS:  Cardiovascular: No filling defects within the pulmonary arteries to suggest acute pulmonary embolism. No acute findings of the aorta or great vessels. No pericardial fluid. Coronary artery calcification and aortic atherosclerotic calcification. Mediastinum/Nodes: No axillary supraclavicular adenopathy. Mild RIGHT hilar adenopathy with a 14 mm hilar lymph node. Lungs/Pleura: New small RIGHT pleural effusion. Small focus of consolidation at the RIGHT lung base measuring 2.5 cm (image 94/6). There is mild interlobular septal thickening in the lungs. Mild ground-glass pattern lungs additionally. Upper Abdomen: Limited view of the liver, kidneys, pancreas are unremarkable. Normal adrenal glands. Nonobstructing calculus in the upper pole of the LEFT kidney measuring 3 mm. Musculoskeletal: No aggressive osseous lesion Review of the MIP images confirms the above findings. IMPRESSION: 1. No evidence acute pulmonary embolism. 2. New small RIGHT pleural effusion.  Mild pulmonary edema pattern. 3. Small focus of consolidation at the RIGHT lung base is favored atelectasis. Cannot exclude small focus of infection. Infarction unlikely. 4. Mild RIGHT hilar adenopathy is likely reactive. 5. Aortic Atherosclerosis (ICD10-I70.0). Electronically Signed   By: Suzy Bouchard M.D.   On: 02/14/2019 05:08   Dg Chest Portable 1 View  Result Date: 02/14/2019 CLINICAL DATA:  Initial evaluation for acute shortness of breath. EXAM: PORTABLE CHEST 1 VIEW COMPARISON:  Prior radiograph from 07/24/2018. FINDINGS: Mild cardiomegaly, stable. Mediastinal silhouette within normal limits. Aortic atherosclerosis. Lungs hypoinflated with elevation of the right hemidiaphragm, similar to previous. Suture line overlies the mid-lower right lung. Interval increase in prominence of the interstitial and peribronchial markings, which could reflect acute bronchiolitis or possibly mild interstitial congestion/edema. Blunting of the right costophrenic angle  could reflect small effusion versus chronic pleural reaction/scarring, similar to previous. Irregular scarring and/or atelectatic changes present within the mid and lower right lung as well. No other consolidative airspace disease. No pneumothorax. No acute osseous finding. Bilateral shoulder arthroplasties noted. IMPRESSION: 1. Interval increase in prominence of the interstitial and peribronchial markings, which could reflect acute bronchiolitis or possibly mild interstitial congestion/edema. 2. Blunting of the right costophrenic angle, which could reflect a small right pleural effusion versus chronic pleural reaction/scarring. 3. Additional scarring and/or atelectatic changes within the mid and lower right lung, stable. 4. Stable cardiomegaly with aortic atherosclerosis. Electronically Signed   By: Jeannine Boga M.D.   On: 02/14/2019 03:45    ____________________________________________   PROCEDURES  Procedure(s) performed:   .Critical Care Performed by: Merrily Pew, MD Authorized by: Merrily Pew, MD   Critical care provider statement:    Critical care time (minutes):  45   Critical care was necessary to treat or prevent imminent or life-threatening deterioration of the following conditions:  Sepsis and respiratory failure   Critical care was time spent personally by me on the following activities:  Discussions with consultants, evaluation of patient's response to treatment, examination of patient, ordering and performing treatments and interventions, ordering and review of laboratory studies, ordering and review of radiographic studies, pulse oximetry, re-evaluation of patient's condition, obtaining history from patient or surrogate and review of old charts  ____________________________________________   INITIAL IMPRESSION / ASSESSMENT AND PLAN /  ED COURSE  Concern for tracheitis vs epiglotitis vs RPA is main concern. Could also be pneumonia? Pending xr.  Sepsis called, abx  started and decadron given.   X-ray appeared to have some prevertebral widening and after discussion with the radiologist decide to go and get a CT of the neck and evaluate his chest for PE versus pneumonia versus fluid overload.  Give some albuterol and steroids during this time and his lungs actually opened up a little bit.  CT does not show any significant abnormality in the neck.  His chest is as above.  Sats are now around 95% on 4 L.  His work of breathing is much better.  A couple more puffs of inhaler given by myself.  Add on BNP and troponin.  Holding on fluids this time I do still seem to be somewhat undifferentiated.  And his lactic acid is normal.  Rest of labs as above.  Patient is much more comfortable breathing status is improved.  Discussed with hospitalist who will meet for further management.  Pertinent labs & imaging results that were available during my care of the patient were reviewed by me and considered in my medical decision making (see chart for details). ____________________________________________  FINAL CLINICAL IMPRESSION(S) / ED DIAGNOSES  Final diagnoses:  Acute respiratory failure with hypoxia (HCC)  Sepsis, due to unspecified organism, unspecified whether acute organ dysfunction present ([place])     MEDICATIONS GIVEN DURING THIS VISIT:  Medications  vancomycin (VANCOCIN) IVPB 750 mg/150 ml premix (has no administration in time range)  ceFEPIme (MAXIPIME) 2 g in sodium chloride 0.9 % 100 mL IVPB (has no administration in time range)  ceFEPIme (MAXIPIME) 2 g in sodium chloride 0.9 % 100 mL IVPB (0 g Intravenous Stopped 02/14/19 0422)  metroNIDAZOLE (FLAGYL) IVPB 500 mg (0 mg Intravenous Stopped 02/14/19 0426)  vancomycin (VANCOCIN) IVPB 1000 mg/200 mL premix (0 mg Intravenous Stopped 02/14/19 0646)  dexamethasone (DECADRON) injection 10 mg (10 mg Intravenous Given 02/14/19 0324)  albuterol (VENTOLIN HFA) 108 (90 Base) MCG/ACT inhaler 8 puff (8 puffs Inhalation  Given 02/14/19 0422)  iohexol (OMNIPAQUE) 350 MG/ML injection 100 mL (100 mLs Intravenous Contrast Given 02/14/19 0449)  LORazepam (ATIVAN) injection 0.5 mg (0.5 mg Intravenous Given 02/14/19 0647)     NEW OUTPATIENT MEDICATIONS STARTED DURING THIS VISIT:  New Prescriptions   No medications on file    Note:  This note was prepared with assistance of Dragon voice recognition software. Occasional wrong-word or sound-a-like substitutions may have occurred due to the inherent limitations of voice recognition software.   Ridge Lafond, Corene Cornea, MD 02/14/19 [phone]

## 2019-02-14 NOTE — ED Notes (Signed)
Date and time results received: 02/14/19   Test: troponin Critical Value: 558  Name of Provider Notified: yates

## 2019-02-14 NOTE — ED Notes (Signed)
O2 sats remain around 97% on 5L O2 via n.c. O2 turned down to 4L at this time, sats remain 95-96%

## 2019-02-14 NOTE — Progress Notes (Signed)
Pharmacy Antibiotic Note  KAMARION ZAGAMI is a 78 y.o. male admitted on 02/14/2019 with shortness of breath.  Pharmacy has been consulted for Vancomycin/Cefepime dosing. WBC WNL. Renal function good. Febrile up to 100.4.  Plan: Vancomycin 750 mg IV q12h >>Estimated AUC: 436 Cefepime 2g IV q8h Trend WBC, temp, renal function  F/U infectious work-up Drug levels as indicated  Temp (24hrs), Avg:100.4 F (38 C), Min:100.4 F (38 C), Max:100.4 F (38 C)  Recent Labs  Lab 02/12/19 0731 02/14/19 0245 02/14/19 0249  WBC  --  10.5  --   CREATININE 1.03 0.93  --   LATICACIDVEN  --   --  1.0    Estimated Creatinine Clearance: 77.5 mL/min (by C-G formula based on SCr of 0.93 mg/dL).    Allergies  Allergen Reactions  . Glucosamine-Chondroitin Anaphylaxis and Other (See Comments)    Stomach cramps, can't eat   . Tetanus Toxoid Anaphylaxis and Hives    hives/throat swells shut   . Fish Oil Other (See Comments)    Stomach cramps, can't eat   . Ibuprofen Nausea Only and Other (See Comments)    Stomach pains   . Naproxen Diarrhea    Narda Bonds, PharmD, BCPS Clinical Pharmacist Phone: [phone]

## 2019-02-14 NOTE — ED Notes (Signed)
Patient transitioned to nasal cannula from NRB, currently on 5L, sats remains 97-99%.

## 2019-02-14 NOTE — Evaluation (Signed)
Clinical/Bedside Swallow Evaluation Patient Details  Name: TORIBIO SEIBER MRN: [id number] Date of Birth: [birth year]  Today's Date: 02/14/2019 Time: SLP Start Time (ACUTE ONLY): 1000 SLP Stop Time (ACUTE ONLY): 1012 SLP Time Calculation (min) (ACUTE ONLY): 12 min  Past Medical History:  Past Medical History:  Diagnosis Date  . Arthritis   . Atrial fibrillation, persistent ([place])   . Chronic sinusitis   . Coronary artery disease   . Depression   . Difficult intubation    was told with shoulder done 2006-alittle narrow  . 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 Hypertension   . Jejunostomy tube fell out    when asked about this in 04/2017, pt denied ever having a J tube, feeding tube, tubes placed post surgery so ??? veracity of a previous J tube.    . 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 Spastic colon   . 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 Stroke (cerebrum) ([place])   . SVT (supraventricular tachycardia) ([facility])    Past Surgical History:  Past Surgical History:  Procedure Laterality Date  . ATRIAL FIBRILLATION ABLATION N/A 02/11/2019   Procedure: ATRIAL FIBRILLATION ABLATION;  Surgeon: Constance Haw, MD;  Location: [facility];  Service: Cardiovascular;  Laterality: N/A;  . CARDIAC CATHETERIZATION    . CATARACT EXTRACTION, BILATERAL    . COLONOSCOPY WITH PROPOFOL N/A 04/17/2017   Procedure: COLONOSCOPY WITH PROPOFOL;  Surgeon: Yetta Flock, MD;  Location: [facility];  Service: Gastroenterology;  Laterality: N/A;  . ESOPHAGEAL DILATION  2017  . ESOPHAGOGASTRODUODENOSCOPY (EGD) WITH PROPOFOL N/A 04/17/2017   Procedure: ESOPHAGOGASTRODUODENOSCOPY (EGD) WITH PROPOFOL;  Surgeon: Yetta Flock, MD;  Location: [facility];  Service: Gastroenterology;  Laterality: N/A;  . EYE SURGERY    . FOOT NEUROMA SURGERY  2002  . LEFT HEART CATH AND CORONARY ANGIOGRAPHY N/A 01/16/2019    Procedure: LEFT HEART CATH AND CORONARY ANGIOGRAPHY;  Surgeon: Jettie Booze, MD;  Location: [facility];  Service: Cardiovascular;  Laterality: N/A;  . LEFT HEART CATHETERIZATION WITH CORONARY ANGIOGRAM Right 06/14/2011   20% LM, chronic occluded mid LAD, 50% ostial LCX, 20% mid RI, RCA with collaterals to mid LAD, mid 10% stenosis, EF 60% 06/14/11  . NASAL SINUS SURGERY    . SHOULDER ARTHROSCOPY  03/01/2011   Procedure: ARTHROSCOPY SHOULDER;  Surgeon: Ninetta Lights, MD;  Location: [facility];  Service: Orthopedics;  Laterality: Right;  arthroscopy shoulder decompression subacromial partial acromioplasty with coracoacromial release, distal claviculectomy, debridement of labrium  . SHOULDER ARTHROSCOPY  03/01/2011   Procedure: ARTHROSCOPY SHOULDER;  Surgeon: Ninetta Lights, MD;  Location: [facility];  Service: Orthopedics;  Laterality: Right;  arthroscopy shoulder decompression subacromial partial acromioplasty with coracoacromial release, distal claviculectomy, debridement of labrium  . TEE WITHOUT CARDIOVERSION N/A 03/19/2016   Procedure: TRANSESOPHAGEAL ECHOCARDIOGRAM (TEE);  Surgeon: Larey Dresser, MD;  Location: [facility];  Service: Cardiovascular;  Laterality: N/A;  . TOTAL KNEE ARTHROPLASTY Right 09/23/2013   Procedure: TOTAL KNEE ARTHROPLASTY;  Surgeon: Ninetta Lights, MD;  Location: [facility];  Service: Orthopedics;  Laterality: Right;  . TOTAL SHOULDER ARTHROPLASTY  06/28/2011   Procedure: TOTAL SHOULDER ARTHROPLASTY;  Surgeon: Ninetta Lights, MD;  Location: [facility];  Service: Orthopedics;  Laterality: Right;  . UVULOPALATOPHARYNGOPLASTY    . VIDEO ASSISTED THORACOSCOPY (VATS)/WEDGE RESECTION Right 06/02/2018  Procedure: VIDEO ASSISTED THORACOSCOPY (VATS)/WEDGE RESECTION with Lymph node disection and intercostal nerve block.;  Surgeon: [name], MD;  Location: [facility];  Service: Thoracic;  Laterality: Right;   HPI:   LASEAN GORNIAK is a 78 y.o. male who presents to the ED for sob, fever and hypoxia. Patient was intubated for general anesthesia on Wednesday for ablation, did fine since then. Tonight woke up acutely dyspneic. Concern for tracheitis vs epiglotitis vs RPA is main concern. X-ray appeared to have some prevertebral widening and after discussion with the radiologist decide to go and get a CT of the neck and evaluate his chest for PE versus pneumonia versus fluid overload.  Give some albuterol and steroids during this time and his lungs actually opened up a little bit. CT does not show any significant abnormality in the neck.     Assessment / Plan / Recommendation Clinical Impression  Pt demosntrates effortful respiration, but adequate O2 sats, RR in 20s. Despite this, vocal quality is clear, able to communicate with slight breathlessness. No signs of aspiration or dysphagia with liquids or solids. Pt is ready to advance to regular diet, but advised him of careful pacing, slow rate, upright posture.       Aspiration Risk  Mild aspiration risk    Diet Recommendation Regular;Thin liquid   Liquid Administration via: Cup;Straw Medication Administration: Whole meds with liquid Supervision: Patient able to self feed Compensations: Slow rate;Small sips/bites Postural Changes: Seated upright at 90 degrees    Other  Recommendations     Follow up Recommendations None      Frequency and Duration            Prognosis        Swallow Study   General HPI: ROCKY GLADDEN is a 78 y.o. male who presents to the ED for sob, fever and hypoxia. Patient was intubated for general anesthesia on Wednesday for ablation, did fine since then. Tonight woke up acutely dyspneic. Concern for tracheitis vs epiglotitis vs RPA is main concern. X-ray appeared to have some prevertebral widening and after discussion with the radiologist decide to go and get a CT of the neck and evaluate his chest for PE versus pneumonia  versus fluid overload.  Give some albuterol and steroids during this time and his lungs actually opened up a little bit. CT does not show any significant abnormality in the neck.   Type of Study: Bedside Swallow Evaluation Previous Swallow Assessment: none Diet Prior to this Study: NPO Temperature Spikes Noted: No Respiratory Status: Nasal cannula History of Recent Intubation: No Behavior/Cognition: Alert;Cooperative;Pleasant mood Oral Cavity Assessment: Within Functional Limits Oral Care Completed by SLP: No Oral Cavity - Dentition: Adequate natural dentition Vision: Functional for self-feeding Self-Feeding Abilities: Able to feed self Patient Positioning: Upright in bed Baseline Vocal Quality: Normal Volitional Cough: Strong Volitional Swallow: Able to elicit    Oral/Motor/Sensory Function Overall Oral Motor/Sensory Function: Within functional limits   Ice Chips Ice chips: Not tested   Thin Liquid Thin Liquid: Within functional limits Presentation: Cup;Straw;Self Fed    Nectar Thick Nectar Thick Liquid: Not tested   Honey Thick Honey Thick Liquid: Not tested   Puree Puree: Within functional limits   Solid     Solid: Within functional limits     Herbie Baltimore, MA Ages Pager [phone] Office [phone]  Alexandar Weisenberger, Katherene Ponto 02/14/2019,10:19 AM

## 2019-02-14 NOTE — ED Triage Notes (Signed)
Pt bib [facility] after pt woke up SOB. Pt had heart ablation on weds for SVT and A-Fib RVR. Pt SPO2 82% on room air when EMS arrived, but up to 99% on 15 lpm via NRB. Labored breathing with use of accessory muscles. EMS VS  SPO2 99% on 15 lpm NRB BP 152/100 RR 28 TEMP 99.5 F HR 80 NSR

## 2019-02-14 NOTE — ED Notes (Addendum)
ED TO INPATIENT HANDOFF REPORT  ED Nurse Name and Phone #: Hilda Wexler [phone]  S Name/Age/Gender George Vega 78 y.o. male Room/Bed: 054C/054C  Code Status   Code Status: Full Code  Home/SNF/Other Home Patient oriented to: self, place and situation Is this baseline? a&ox3  Triage Complete: Triage complete  Chief Complaint SHOB, FEVER  Triage Note Pt bib gcems after pt woke up SOB. Pt had heart ablation on weds for SVT and A-Fib RVR. Pt SPO2 82% on room air when EMS arrived, but up to 99% on 15 lpm via NRB. Labored breathing with use of accessory muscles. EMS VS  SPO2 99% on 15 lpm NRB BP 152/100 RR 28 TEMP 99.5 F HR 80 NSR   Allergies Allergies  Allergen Reactions  . Glucosamine-Chondroitin Anaphylaxis and Other (See Comments)    Stomach cramps, can't eat   . Tetanus Toxoid Anaphylaxis and Hives    hives/throat swells shut   . Fish Oil Other (See Comments)    Stomach cramps, can't eat   . Ibuprofen Nausea Only and Other (See Comments)    Stomach pains   . Naproxen Diarrhea    Level of Care/Admitting Diagnosis ED Disposition    ED Disposition Condition [facility]: [place] [[id number]]  Level of Care: Progressive [102]  Admit to Progressive based on following criteria: RESPIRATORY PROBLEMS hypoxemic/hypercapnic respiratory failure that is responsive to NIPPV (BiPAP) or High Flow Nasal Cannula (6-80 lpm). Frequent assessment/intervention, no > Q2 hrs < Q4 hrs, to maintain oxygenation and pulmonary hygiene.  Covid Evaluation: Confirmed COVID Negative  Diagnosis: Acute respiratory failure ([place]) [518.81.ICD-9-CM]  Admitting Physician: Shela Leff [[id number]]  Attending Physician: Shela Leff [[id number]]  Estimated length of stay: past midnight tomorrow  Certification:: I certify this patient will need inpatient services for at least 2 midnights  PT Class (Do Not Modify): Inpatient [101]  PT Acc Code (Do Not  Modify): Private [1]       B Medical/Surgery History Past Medical History:  Diagnosis Date  . Arthritis   . Atrial fibrillation, persistent ([place])   . Chronic sinusitis   . Coronary artery disease   . Depression   . Difficult intubation    was told with shoulder done 2006-alittle narrow  . 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 Hypertension   . Jejunostomy tube fell out    when asked about this in 04/2017, pt denied ever having a J tube, feeding tube, tubes placed post surgery so ??? veracity of a previous J tube.    . 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 Spastic colon   . 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 Stroke (cerebrum) ([place])   . SVT (supraventricular tachycardia) ([facility])    Past Surgical History:  Procedure Laterality Date  . ATRIAL FIBRILLATION ABLATION N/A 02/11/2019   Procedure: ATRIAL FIBRILLATION ABLATION;  Surgeon: Constance Haw, MD;  Location: [facility];  Service: Cardiovascular;  Laterality: N/A;  . CARDIAC CATHETERIZATION    . CATARACT EXTRACTION, BILATERAL    . COLONOSCOPY WITH PROPOFOL N/A 04/17/2017   Procedure: COLONOSCOPY WITH PROPOFOL;  Surgeon: Yetta Flock, MD;  Location: [facility];  Service: Gastroenterology;  Laterality: N/A;  . ESOPHAGEAL DILATION  2017  . ESOPHAGOGASTRODUODENOSCOPY (EGD) WITH PROPOFOL N/A 04/17/2017   Procedure: ESOPHAGOGASTRODUODENOSCOPY (EGD) WITH PROPOFOL;  Surgeon: Yetta Flock, MD;  Location:  [facility];  Service: Gastroenterology;  Laterality: N/A;  . EYE SURGERY    . FOOT NEUROMA SURGERY  2002  . LEFT HEART CATH AND CORONARY ANGIOGRAPHY N/A 01/16/2019   Procedure: LEFT HEART CATH AND CORONARY ANGIOGRAPHY;  Surgeon: Jettie Booze, MD;  Location: [facility];  Service: Cardiovascular;  Laterality: N/A;  . LEFT HEART CATHETERIZATION WITH CORONARY ANGIOGRAM Right 06/14/2011   20% LM, chronic  occluded mid LAD, 50% ostial LCX, 20% mid RI, RCA with collaterals to mid LAD, mid 10% stenosis, EF 60% 06/14/11  . NASAL SINUS SURGERY    . SHOULDER ARTHROSCOPY  03/01/2011   Procedure: ARTHROSCOPY SHOULDER;  Surgeon: Ninetta Lights, MD;  Location: [facility];  Service: Orthopedics;  Laterality: Right;  arthroscopy shoulder decompression subacromial partial acromioplasty with coracoacromial release, distal claviculectomy, debridement of labrium  . SHOULDER ARTHROSCOPY  03/01/2011   Procedure: ARTHROSCOPY SHOULDER;  Surgeon: Ninetta Lights, MD;  Location: [facility];  Service: Orthopedics;  Laterality: Right;  arthroscopy shoulder decompression subacromial partial acromioplasty with coracoacromial release, distal claviculectomy, debridement of labrium  . TEE WITHOUT CARDIOVERSION N/A 03/19/2016   Procedure: TRANSESOPHAGEAL ECHOCARDIOGRAM (TEE);  Surgeon: Larey Dresser, MD;  Location: [facility];  Service: Cardiovascular;  Laterality: N/A;  . TOTAL KNEE ARTHROPLASTY Right 09/23/2013   Procedure: TOTAL KNEE ARTHROPLASTY;  Surgeon: Ninetta Lights, MD;  Location: [facility];  Service: Orthopedics;  Laterality: Right;  . TOTAL SHOULDER ARTHROPLASTY  06/28/2011   Procedure: TOTAL SHOULDER ARTHROPLASTY;  Surgeon: Ninetta Lights, MD;  Location: [facility];  Service: Orthopedics;  Laterality: Right;  . UVULOPALATOPHARYNGOPLASTY    . VIDEO ASSISTED THORACOSCOPY (VATS)/WEDGE RESECTION Right 06/02/2018   Procedure: VIDEO ASSISTED THORACOSCOPY (VATS)/WEDGE RESECTION with Lymph node disection and intercostal nerve block.;  Surgeon: [name], MD;  Location: [facility];  Service: Thoracic;  Laterality: Right;     A IV Location/Drains/Wounds Patient Lines/Drains/Airways Status   Active Line/Drains/Airways    Name:   Placement date:   Placement time:   Site:   Days:   Peripheral IV 02/14/19 Right Hand   02/14/19    0250    Hand   less than 1   Peripheral IV  02/14/19 Right Antecubital   02/14/19    0422    Antecubital   less than 1   Y Chest Tube 1 and 2 Right Pleural 28 Fr. Right Pleural 28 Fr.   06/02/18    1001     257   Incision (Closed) 09/23/13 Knee Right   09/23/13    1213     1970   Incision (Closed) 04/29/18 Back Right;Mid   04/29/18    1140     291          Intake/Output Last 24 hours  Intake/Output Summary (Last 24 hours) at 02/14/2019 1612 Last data filed at 02/14/2019 1500 Gross per 24 hour  Intake 100 ml  Output 1900 ml  Net -1800 ml    Labs/Imaging Results for orders placed or performed during the hospital encounter of 02/14/19 (from the past 48 hour(s))  CBC with Differential     Status: Abnormal   Collection Time: 02/14/19  2:45 AM  Result Value Ref Range   WBC 10.5 4.0 - 10.5 K/uL   RBC 3.50 (L) 4.22 - 5.81 MIL/uL   Hemoglobin 11.2 (L) 13.0 - 17.0 g/dL   HCT 34.8 (L) 39.0 - 52.0 %   MCV 99.4 80.0 - 100.0  fL   MCH 32.0 26.0 - 34.0 pg   MCHC 32.2 30.0 - 36.0 g/dL   RDW 13.2 11.5 - 15.5 %   Platelets 148 (L) 150 - 400 K/uL   nRBC 0.0 0.0 - 0.2 %   Neutrophils Relative % 73 %   Neutro Abs 7.6 1.7 - 7.7 K/uL   Lymphocytes Relative 13 %   Lymphs Abs 1.4 0.7 - 4.0 K/uL   Monocytes Relative 8 %   Monocytes Absolute 0.9 0.1 - 1.0 K/uL   Eosinophils Relative 5 %   Eosinophils Absolute 0.5 0.0 - 0.5 K/uL   Basophils Relative 1 %   Basophils Absolute 0.1 0.0 - 0.1 K/uL   Immature Granulocytes 0 %   Abs Immature Granulocytes 0.04 0.00 - 0.07 K/uL    Comment: Performed at [facility] [address], [place], [place] [address]  Comprehensive metabolic panel     Status: Abnormal   Collection Time: 02/14/19  2:45 AM  Result Value Ref Range   Sodium 140 135 - 145 mmol/L   Potassium 4.0 3.5 - 5.1 mmol/L   Chloride 107 98 - 111 mmol/L   CO2 25 22 - 32 mmol/L   Glucose, Bld 127 (H) 70 - 99 mg/dL   BUN 15 8 - 23 mg/dL   Creatinine, Ser 0.93 0.61 - 1.24 mg/dL   Calcium 8.9 8.9 - 10.3 mg/dL   Total Protein 6.0  (L) 6.5 - 8.1 g/dL   Albumin 3.6 3.5 - 5.0 g/dL   AST 66 (H) 15 - 41 U/L   ALT 64 (H) 0 - 44 U/L   Alkaline Phosphatase 115 38 - 126 U/L   Total Bilirubin 0.7 0.3 - 1.2 mg/dL   GFR calc non Af Amer >60 >60 mL/min   GFR calc Af Amer >60 >60 mL/min   Anion gap 8 5 - 15    Comment: Performed at [facility] [address], [place], [place] [address]  APTT     Status: None   Collection Time: 02/14/19  2:45 AM  Result Value Ref Range   aPTT 35 24 - 36 seconds    Comment: Performed at [facility] [address] [place], [place] [address]  Protime-INR     Status: Abnormal   Collection Time: 02/14/19  2:45 AM  Result Value Ref Range   Prothrombin Time 17.7 (H) 11.4 - 15.2 seconds   INR 1.5 (H) 0.8 - 1.2    Comment: (NOTE) INR goal varies based on device and disease states. Performed at [facility], [place] [address] [place], [place] [address]   Brain natriuretic peptide     Status: Abnormal   Collection Time: 02/14/19  2:45 AM  Result Value Ref Range   B Natriuretic Peptide 208.6 (H) 0.0 - 100.0 pg/mL    Comment: Performed at [facility] [address], [place], [place] [address]  Lactic acid, plasma     Status: None   Collection Time: 02/14/19  2:49 AM  Result Value Ref Range   Lactic Acid, Venous 1.0 0.5 - 1.9 mmol/L    Comment: Performed at [facility] [address] [place], [place] [address]  SARS CORONAVIRUS 2 (TAT 6-24 HRS) Nasopharyngeal Nasopharyngeal Swab     Status: None   Collection Time: 02/14/19  3:36 AM   Specimen: Nasopharyngeal Swab  Result Value Ref Range   SARS Coronavirus 2 NEGATIVE NEGATIVE    Comment: (NOTE)  SARS-CoV-2 target nucleic acids are NOT DETECTED. The SARS-CoV-2 RNA is generally detectable in upper and lower respiratory specimens during the acute phase of infection. Negative results do not preclude SARS-CoV-2 infection, do not rule out co-infections with other pathogens, and should not be used as the sole  basis for treatment or other patient management decisions. Negative results must be combined with clinical observations, patient history, and epidemiological information. The expected result is Negative. Fact Sheet for Patients: SugarRoll.be Fact Sheet for Healthcare Providers: https://www.woods-mathews.com/ This test is not yet approved or cleared by the [place] FDA and  has been authorized for detection and/or diagnosis of SARS-CoV-2 by FDA under an Emergency Use Authorization (EUA). This EUA will remain  in effect (meaning this test can be used) for the duration of the COVID-19 declaration under Section 56 4(b)(1) of the Act, 21 U.S.C. section 360bbb-3(b)(1), unless the authorization is terminated or revoked sooner. Performed at [facility], [place] [address], [place], [place] [address]   Urinalysis, Routine w reflex microscopic     Status: None   Collection Time: 02/14/19  4:30 AM  Result Value Ref Range   Color, Urine YELLOW YELLOW   APPearance CLEAR CLEAR   Specific Gravity, Urine 1.020 1.005 - 1.030   pH 5.0 5.0 - 8.0   Glucose, UA NEGATIVE NEGATIVE mg/dL   Hgb urine dipstick NEGATIVE NEGATIVE   Bilirubin Urine NEGATIVE NEGATIVE   Ketones, ur NEGATIVE NEGATIVE mg/dL   Protein, ur NEGATIVE NEGATIVE mg/dL   Nitrite NEGATIVE NEGATIVE   Leukocytes,Ua NEGATIVE NEGATIVE    Comment: Performed at [facility] [address] [place], [place] [address]  Lactic acid, plasma     Status: None   Collection Time: 02/14/19  5:44 AM  Result Value Ref Range   Lactic Acid, Venous 1.4 0.5 - 1.9 mmol/L    Comment: Performed at [facility] [address] [place], [place] [address]  Troponin I (High Sensitivity)     Status: Abnormal   Collection Time: 02/14/19  5:44 AM  Result Value Ref Range   Troponin I (High Sensitivity) 558 (HH) <18 ng/L    Comment: CRITICAL RESULT CALLED TO, READ BACK BY AND VERIFIED  WITH: B.OLDLAND,RN @ 5732 02/14/2019 WEBBERJ (NOTE) Elevated high sensitivity troponin I (hsTnI) values and significant  changes across serial measurements may suggest ACS but many other  chronic and acute conditions are known to elevate hsTnI results.  Refer to the Links section for chest pain algorithms and additional  guidance. Performed at [facility], [place] [address] [place], [place] [address]   I-STAT 7, (LYTES, BLD GAS, ICA, H+H)     Status: Abnormal   Collection Time: 02/14/19  8:12 AM  Result Value Ref Range   pH, Arterial 7.361 7.350 - 7.450   pCO2 arterial 43.7 32.0 - 48.0 mmHg   pO2, Arterial 86.0 83.0 - 108.0 mmHg   Bicarbonate 24.8 20.0 - 28.0 mmol/L   TCO2 26 22 - 32 mmol/L   O2 Saturation 96.0 %   Acid-base deficit 1.0 0.0 - 2.0 mmol/L   Sodium 138 135 - 145 mmol/L   Potassium 4.2 3.5 - 5.1 mmol/L   Calcium, Ion 1.26 1.15 - 1.40 mmol/L   HCT 33.0 (L) 39.0 - 52.0 %   Hemoglobin 11.2 (L) 13.0 - 17.0 g/dL   Patient temperature 98.6 F    Collection site RADIAL, ALLEN'S TEST ACCEPTABLE    Drawn by Operator    Sample  type ARTERIAL   Troponin I (High Sensitivity)     Status: Abnormal   Collection Time: 02/14/19  9:41 AM  Result Value Ref Range   Troponin I (High Sensitivity) 432 (HH) <18 ng/L    Comment: CRITICAL VALUE NOTED.  VALUE IS CONSISTENT WITH PREVIOUSLY REPORTED AND CALLED VALUE. (NOTE) Elevated high sensitivity troponin I (hsTnI) values and significant  changes across serial measurements may suggest ACS but many other  chronic and acute conditions are known to elevate hsTnI results.  Refer to the Links section for chest pain algorithms and additional  guidance. Performed at [facility], [place] [address] [place],  [address]   Procalcitonin - Baseline     Status: None   Collection Time: 02/14/19 11:03 AM  Result Value Ref Range   Procalcitonin <0.10 ng/mL    Comment:        Interpretation: PCT (Procalcitonin) <= 0.5  ng/mL: Systemic infection (sepsis) is not likely. Local bacterial infection is possible. (NOTE)       Sepsis PCT Algorithm           Lower Respiratory Tract                                      Infection PCT Algorithm    ----------------------------     ----------------------------         PCT < 0.25 ng/mL                PCT < 0.10 ng/mL         Strongly encourage             Strongly discourage   discontinuation of antibiotics    initiation of antibiotics    ----------------------------     -----------------------------       PCT 0.25 - 0.50 ng/mL            PCT 0.10 - 0.25 ng/mL               OR       >80% decrease in PCT            Discourage initiation of                                            antibiotics      Encourage discontinuation           of antibiotics    ----------------------------     -----------------------------         PCT >= 0.50 ng/mL              PCT 0.26 - 0.50 ng/mL               AND        <80% decrease in PCT             Encourage initiation of                                             antibiotics       Encourage continuation           of antibiotics    ----------------------------     -----------------------------  PCT >= 0.50 ng/mL                  PCT > 0.50 ng/mL               AND         increase in PCT                  Strongly encourage                                      initiation of antibiotics    Strongly encourage escalation           of antibiotics                                     -----------------------------                                           PCT <= 0.25 ng/mL                                                 OR                                        > 80% decrease in PCT                                     Discontinue / Do not initiate                                             antibiotics Performed at [facility], [address] [place], [place] [address]   Troponin I (High Sensitivity)     Status:  Abnormal   Collection Time: 02/14/19 11:03 AM  Result Value Ref Range   Troponin I (High Sensitivity) 379 (HH) <18 ng/L    Comment: CRITICAL VALUE NOTED.  VALUE IS CONSISTENT WITH PREVIOUSLY REPORTED AND CALLED VALUE. (NOTE) Elevated high sensitivity troponin I (hsTnI) values and significant  changes across serial measurements may suggest ACS but many other  chronic and acute conditions are known to elevate hsTnI results.  Refer to the Links section for chest pain algorithms and additional  guidance. Performed at [facility], [place] [address], [place],  [address]   Basic metabolic panel     Status: Abnormal   Collection Time: 02/14/19  1:00 PM  Result Value Ref Range   Sodium 138 135 - 145 mmol/L   Potassium 4.0 3.5 - 5.1 mmol/L   Chloride 105 98 - 111 mmol/L   CO2 24 22 - 32 mmol/L   Glucose, Bld 247 (H) 70 - 99 mg/dL   BUN 15 8 - 23 mg/dL   Creatinine, Ser 0.92 0.61 - 1.24 mg/dL   Calcium  8.8 (L) 8.9 - 10.3 mg/dL   GFR calc non Af Amer >60 >60 mL/min   GFR calc Af Amer >60 >60 mL/min   Anion gap 9 5 - 15    Comment: Performed at [facility] [address] [place], [place] [address]   Dg Neck Soft Tissue  Result Date: 02/14/2019 CLINICAL DATA:  Initial evaluation for acute stridor, shortness of breath. EXAM: NECK SOFT TISSUES - 1+ VIEW COMPARISON:  None. FINDINGS: Epiglottis within normal limits. Question mild retropharyngeal/prevertebral soft tissue fullness at the level of C5-6, measuring up to approximately 2 cm. No retropharyngeal gas. Small linear radiopaque density seen just anterior to the C3 vertebral body felt to be related to thyroid or cricoid calcification. No radiopaque foreign body. Advanced multilevel degenerative spondylosis, most notable at C3-4. Remainder of the visualized soft tissues of the neck demonstrate no acute finding. Probable vascular calcifications overlie the region of both carotid bifurcations. Scattered peribronchial thickening  seen within the visualized upper lungs. Probable superimposed right upper lobe atelectatic changes. IMPRESSION: 1. Question mild retropharyngeal soft tissue fullness at the level of C5-6, which could reflect edema and/or effusion. No frank retropharyngeal gas. Normal epiglottis. 2. Aortic Atherosclerosis (ICD10-I70.0). 3. Moderate to advanced multilevel cervical spondylosis, most notable at C3-4. Results discussed by telephone at the time of interpretation on 02/14/2019 at 3 STIR:41 am to [place]. Electronically Signed   By: Jeannine Boga M.D.   On: 02/14/2019 03:42   Ct Soft Tissue Neck W Contrast  Result Date: 02/14/2019 CLINICAL DATA:  Initial evaluation for acute sore throat, stridor. EXAM: CT NECK WITH CONTRAST TECHNIQUE: Multidetector CT imaging of the neck was performed using the standard protocol following the bolus administration of intravenous contrast. CONTRAST:  158mL OMNIPAQUE IOHEXOL 350 MG/ML SOLN COMPARISON:  Prior radiograph from earlier same day. FINDINGS: Pharynx and larynx: Oral cavity within normal limits without discrete mass or collection. No acute abnormality about the dentition. Palatine tonsils symmetric and within normal limits. Few punctate calcified tonsilliths noted on the right. Parapharyngeal fat maintained. Nasopharynx and the remainder of the oropharynx within normal limits. Epiglottis normal. Vallecula clear. Remainder of the hypopharynx and supraglottic larynx within normal limits. No retropharyngeal collection, effusion, or significant soft tissue swelling. Previously question prevertebral/retropharyngeal fullness likely related to positioning and/or summation of shadows on prior radiograph. True cords symmetric and normal. Subglottic airway widely patent and clear. Salivary glands: Salivary glands including the parotid and submandibular glands within normal limits. Thyroid: Thyroid within normal limits. No discrete thyroid nodule or mass. Lymph nodes: No  pathologically enlarged lymph nodes identified within the neck. Vascular: Moderate atherosclerotic change present about the aortic arch, carotid bifurcations, and carotid siphons. Normal intravascular enhancement seen throughout the neck. Limited intracranial: Age-related cerebral atrophy. Otherwise unremarkable. Visualized orbits: Globes and orbital soft tissues within normal limits. Mastoids and visualized paranasal sinuses: Chronic mucosal thickening noted within the ethmoidal air cells, sphenoid sinuses, and maxillary sinuses. Superimposed air-fluid levels noted within the sphenoid sinuses, suggesting acute on chronic disease. Mastoid air cells and middle ear cavities well pneumatized and free of fluid. Skeleton: No acute osseous abnormality. No discrete lytic or blastic osseous lesions. Moderate multilevel cervical spondylosis, most notable at C3-4 and C5-6. Upper chest: Small layering bilateral pleural effusions, right greater than left, partially visualized. Associated scattered atelectatic changes seen within the visualized lungs. Partially visualized upper chest otherwise demonstrates no acute finding. Other: None. IMPRESSION: 1. Negative CT of the neck. No discrete mass or collection identified. Previously questioned  prevertebral/retropharyngeal fullness likely related to positioning and/or summation of shadows on prior radiograph. 2. Small layering bilateral pleural effusions, right greater than left, partially visualized. 3. Chronic pan sinusitis with superimposed air-fluid levels within the sphenoid sinuses, suggesting acute on chronic disease. 4.  Aortic Atherosclerosis (ICD10-I70.0). Electronically Signed   By: Jeannine Boga M.D.   On: 02/14/2019 05:22   Ct Angio Chest Pe W And/or Wo Contrast  Result Date: 02/14/2019 CLINICAL DATA:  Short of breath, recent cardiac ablation for supraventricular tachycardia. EXAM: CT ANGIOGRAPHY CHEST WITH CONTRAST TECHNIQUE: Multidetector CT imaging of the  chest was performed using the standard protocol during bolus administration of intravenous contrast. Multiplanar CT image reconstructions and MIPs were obtained to evaluate the vascular anatomy. CONTRAST:  133mL OMNIPAQUE IOHEXOL 350 MG/ML SOLN COMPARISON:  Chest CT 01/02/2019 FINDINGS: Cardiovascular: No filling defects within the pulmonary arteries to suggest acute pulmonary embolism. No acute findings of the aorta or great vessels. No pericardial fluid. Coronary artery calcification and aortic atherosclerotic calcification. Mediastinum/Nodes: No axillary supraclavicular adenopathy. Mild RIGHT hilar adenopathy with a 14 mm hilar lymph node. Lungs/Pleura: New small RIGHT pleural effusion. Small focus of consolidation at the RIGHT lung base measuring 2.5 cm (image 94/6). There is mild interlobular septal thickening in the lungs. Mild ground-glass pattern lungs additionally. Upper Abdomen: Limited view of the liver, kidneys, pancreas are unremarkable. Normal adrenal glands. Nonobstructing calculus in the upper pole of the LEFT kidney measuring 3 mm. Musculoskeletal: No aggressive osseous lesion Review of the MIP images confirms the above findings. IMPRESSION: 1. No evidence acute pulmonary embolism. 2. New small RIGHT pleural effusion.  Mild pulmonary edema pattern. 3. Small focus of consolidation at the RIGHT lung base is favored atelectasis. Cannot exclude small focus of infection. Infarction unlikely. 4. Mild RIGHT hilar adenopathy is likely reactive. 5. Aortic Atherosclerosis (ICD10-I70.0). Electronically Signed   By: Suzy Bouchard M.D.   On: 02/14/2019 05:08   Dg Chest Portable 1 View  Result Date: 02/14/2019 CLINICAL DATA:  Initial evaluation for acute shortness of breath. EXAM: PORTABLE CHEST 1 VIEW COMPARISON:  Prior radiograph from 07/24/2018. FINDINGS: Mild cardiomegaly, stable. Mediastinal silhouette within normal limits. Aortic atherosclerosis. Lungs hypoinflated with elevation of the right  hemidiaphragm, similar to previous. Suture line overlies the mid-lower right lung. Interval increase in prominence of the interstitial and peribronchial markings, which could reflect acute bronchiolitis or possibly mild interstitial congestion/edema. Blunting of the right costophrenic angle could reflect small effusion versus chronic pleural reaction/scarring, similar to previous. Irregular scarring and/or atelectatic changes present within the mid and lower right lung as well. No other consolidative airspace disease. No pneumothorax. No acute osseous finding. Bilateral shoulder arthroplasties noted. IMPRESSION: 1. Interval increase in prominence of the interstitial and peribronchial markings, which could reflect acute bronchiolitis or possibly mild interstitial congestion/edema. 2. Blunting of the right costophrenic angle, which could reflect a small right pleural effusion versus chronic pleural reaction/scarring. 3. Additional scarring and/or atelectatic changes within the mid and lower right lung, stable. 4. Stable cardiomegaly with aortic atherosclerosis. Electronically Signed   By: Jeannine Boga M.D.   On: 02/14/2019 03:45    Pending Labs Unresulted Labs (From admission, onward)    Start     Ordered   02/15/19 0500  Procalcitonin  Daily,   R     02/14/19 1025   02/15/19 0000  CBC WITH DIFFERENTIAL  Tomorrow morning,   R     02/14/19 0821   02/14/19 2694  Basic metabolic panel  Daily,  R     02/14/19 1235   02/14/19 0754  Blood gas, arterial  ONCE - STAT,   R     02/14/19 0753   02/14/19 0249  Blood Culture (routine x 2)  BLOOD CULTURE X 2,   STAT     02/14/19 0249   02/14/19 0249  Urine culture  ONCE - STAT,   STAT     02/14/19 0249          Vitals/Pain Today's Vitals   02/14/19 1204 02/14/19 1229 02/14/19 1435 02/14/19 1610  BP:   123/74 123/71  Pulse: 84  95 75  Resp: (!) 22  20 16   Temp:      TempSrc:      SpO2: 100%  99% 95%  PainSc:  0-No pain      Isolation  Precautions No active isolations  Medications Medications  ceFEPIme (MAXIPIME) 2 g in sodium chloride 0.9 % 100 mL IVPB (0 g Intravenous Stopped 02/14/19 1500)  dofetilide (TIKOSYN) capsule 250 mcg (250 mcg Oral Given 02/14/19 1029)  irbesartan (AVAPRO) tablet 150 mg (150 mg Oral Given 02/14/19 1029)  metoprolol tartrate (LOPRESSOR) tablet 25 mg (25 mg Oral Given 02/14/19 1029)  rosuvastatin (CRESTOR) tablet 5 mg (5 mg Oral Given 02/14/19 1029)  PARoxetine (PAXIL) tablet 30 mg (has no administration in time range)  pantoprazole (PROTONIX) EC tablet 40 mg (40 mg Oral Given 02/14/19 1029)  magnesium oxide (MAG-OX) tablet 400 mg (400 mg Oral Given 02/14/19 1029)  polycarbophil (FIBERCON) tablet 1,250 mg (has no administration in time range)  tamsulosin (FLOMAX) capsule 0.4 mg (has no administration in time range)  apixaban (ELIQUIS) tablet 5 mg (5 mg Oral Given 02/14/19 1029)  Melatonin TABS 10 mg (has no administration in time range)  polyethylene glycol 0.4% and propylene glycol 0.3% (SYSTANE) ophthalmic gel (has no administration in time range)  sodium chloride flush (NS) 0.9 % injection 3 mL (3 mLs Intravenous Given 02/14/19 1107)  sodium chloride flush (NS) 0.9 % injection 3 mL (has no administration in time range)  0.9 %  sodium chloride infusion (has no administration in time range)  acetaminophen (TYLENOL) tablet 650 mg (has no administration in time range)  ondansetron (ZOFRAN) injection 4 mg (has no administration in time range)  LORazepam (ATIVAN) tablet 1-4 mg (1 mg Oral Given 02/14/19 1029)    Or  LORazepam (ATIVAN) injection 1-4 mg ( Intravenous See Alternative 02/14/19 1029)  thiamine (VITAMIN B-1) tablet 100 mg (100 mg Oral Given 02/14/19 1049)    Or  thiamine (B-1) injection 100 mg ( Intravenous See Alternative 04/16/07 7353)  folic acid (FOLVITE) tablet 1 mg (1 mg Oral Given 02/14/19 1049)  multivitamin with minerals tablet 1 tablet (1 tablet Oral Given 02/14/19 1049)  furosemide  (LASIX) injection 40 mg (has no administration in time range)  albuterol (PROVENTIL) (2.5 MG/3ML) 0.083% nebulizer solution 2.5 mg (has no administration in time range)  ceFEPIme (MAXIPIME) 2 g in sodium chloride 0.9 % 100 mL IVPB (0 g Intravenous Stopped 02/14/19 0422)  metroNIDAZOLE (FLAGYL) IVPB 500 mg (0 mg Intravenous Stopped 02/14/19 0426)  vancomycin (VANCOCIN) IVPB 1000 mg/200 mL premix (0 mg Intravenous Stopped 02/14/19 0646)  dexamethasone (DECADRON) injection 10 mg (10 mg Intravenous Given 02/14/19 0324)  albuterol (VENTOLIN HFA) 108 (90 Base) MCG/ACT inhaler 8 puff (8 puffs Inhalation Given 02/14/19 0422)  iohexol (OMNIPAQUE) 350 MG/ML injection 100 mL (100 mLs Intravenous Contrast Given 02/14/19 0449)  LORazepam (ATIVAN) injection 0.5 mg (0.5 mg Intravenous Given  02/14/19 0647)  furosemide (LASIX) injection 40 mg (40 mg Intravenous Given 02/14/19 0843)  furosemide (LASIX) injection 40 mg (40 mg Intravenous Given 02/14/19 1258)    Mobility walks with person assist High fall risk   Focused Assessments Pulmonary Assessment Handoff:  Lung sounds: Bilateral Breath Sounds: Inspiratory wheezes, Expiratory wheezes L Breath Sounds: Diminished R Breath Sounds: Diminished O2 Device: Nasal Cannula O2 Flow Rate (L/min): 4 L/min      R Recommendations: See Admitting Provider Note  Report given to:   Additional Notes:   Patient on 4L o2

## 2019-02-14 NOTE — Progress Notes (Signed)
  Echocardiogram 2D Echocardiogram has been performed.  Burnett Kanaris 02/14/2019, 9:50 AM

## 2019-02-14 NOTE — Plan of Care (Signed)
  Problem: Education: Goal: Knowledge of General Education information will improve Description Including pain rating scale, medication(s)/side effects and non-pharmacologic comfort measures Outcome: Progressing   Problem: Clinical Measurements: Goal: Will remain free from infection Outcome: Progressing Goal: Respiratory complications will improve Outcome: Progressing Goal: Cardiovascular complication will be avoided Outcome: Progressing   

## 2019-02-15 DIAGNOSIS — C3400 Malignant neoplasm of unspecified main bronchus: Secondary | ICD-10-CM

## 2019-02-15 DIAGNOSIS — I5033 Acute on chronic diastolic (congestive) heart failure: Secondary | ICD-10-CM | POA: Diagnosis not present

## 2019-02-15 LAB — CBC WITH DIFFERENTIAL/PLATELET
Abs Immature Granulocytes: 0.08 10*3/uL — ABNORMAL HIGH (ref 0.00–0.07)
Basophils Absolute: 0 10*3/uL (ref 0.0–0.1)
Basophils Relative: 0 %
Eosinophils Absolute: 0 10*3/uL (ref 0.0–0.5)
Eosinophils Relative: 0 %
HCT: 33.1 % — ABNORMAL LOW (ref 39.0–52.0)
Hemoglobin: 10.8 g/dL — ABNORMAL LOW (ref 13.0–17.0)
Immature Granulocytes: 1 %
Lymphocytes Relative: 10 %
Lymphs Abs: 1.4 10*3/uL (ref 0.7–4.0)
MCH: 32.2 pg (ref 26.0–34.0)
MCHC: 32.6 g/dL (ref 30.0–36.0)
MCV: 98.8 fL (ref 80.0–100.0)
Monocytes Absolute: 1.1 10*3/uL — ABNORMAL HIGH (ref 0.1–1.0)
Monocytes Relative: 8 %
Neutro Abs: 11.3 10*3/uL — ABNORMAL HIGH (ref 1.7–7.7)
Neutrophils Relative %: 81 %
Platelets: 152 10*3/uL (ref 150–400)
RBC: 3.35 MIL/uL — ABNORMAL LOW (ref 4.22–5.81)
RDW: 13 % (ref 11.5–15.5)
WBC: 13.9 10*3/uL — ABNORMAL HIGH (ref 4.0–10.5)
nRBC: 0 % (ref 0.0–0.2)

## 2019-02-15 LAB — BASIC METABOLIC PANEL
Anion gap: 9 (ref 5–15)
BUN: 27 mg/dL — ABNORMAL HIGH (ref 8–23)
CO2: 28 mmol/L (ref 22–32)
Calcium: 9.3 mg/dL (ref 8.9–10.3)
Chloride: 104 mmol/L (ref 98–111)
Creatinine, Ser: 1.19 mg/dL (ref 0.61–1.24)
GFR calc Af Amer: >60 mL/min (ref >60)
GFR calc non Af Amer: 58 mL/min — ABNORMAL LOW (ref >60)
Glucose, Bld: 158 mg/dL — ABNORMAL HIGH (ref 70–99)
Potassium: 4 mmol/L (ref 3.5–5.1)
Sodium: 141 mmol/L (ref 135–145)

## 2019-02-15 LAB — PROCALCITONIN: Procalcitonin: <0.1 ng/mL

## 2019-02-15 LAB — URINE CULTURE: Culture: NO GROWTH

## 2019-02-15 MED ORDER — SODIUM CHLORIDE 0.9 % IV SOLN
2.0000 g | Freq: Two times a day (BID) | INTRAVENOUS | Status: DC
  Administered 2019-02-15: 2 g via INTRAVENOUS
  Filled 2019-02-15 (×3): qty 2

## 2019-02-15 MED ORDER — FUROSEMIDE 40 MG PO TABS
40.0000 mg | ORAL_TABLET | Freq: Every day | ORAL | Status: DC
  Administered 2019-02-16 – 2019-02-17 (×2): 40 mg via ORAL
  Filled 2019-02-15 (×2): qty 1

## 2019-02-15 NOTE — Plan of Care (Signed)

## 2019-02-15 NOTE — Progress Notes (Signed)
RT into room for Neb tx.

## 2019-02-15 NOTE — Plan of Care (Signed)
  Problem: Clinical Measurements: Goal: Respiratory complications will improve Outcome: Progressing Goal: Cardiovascular complication will be avoided Outcome: Progressing   

## 2019-02-15 NOTE — Progress Notes (Signed)
PROGRESS NOTE    George Vega  MWU:[id number] DOB: 1941-01-18 DOA: 02/14/2019 PCP: [name], MD (Confirm with patient/family/NH records and if not entered, this HAS to be entered at [facility] point of entry. "No PCP" if truly none.)   Brief Narrative:   Patient recently had ablation done for A. fib and was intubated. Presented to the ED with complaints of acute onset shortness of breath that started last night. Oxygen saturation 82% on room air by EMS and was placed on nonrebreather. Noted to have stridor and febrile with temperature 100.4 F. CT without any explanation for stridor/swelling/signs of upper airway occlusion. CT chest with a very tiny area of questionable consolidation. UA not suggestive of infection. Started on broad-spectrum antibiotics and was given albuterol plus steroids. Was admitted with suspected diastolic HF and possible pneumonia. Remains on Cefepime. Also has history of alcohol withdrawal. Was confused on presentation but mentation is improving now.        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  -Patient with recent (12/2) radiofrequency ablation for afib then presenting with acute respiratory failure to the 80s -Likely due to diastolic HF and possible aspiration pna, poa.  -COVID negative -Seen by SLP with no concern for aspiration -Continue Cefepime (received 1 dose of Vanc in the ED) -Continue O2 supplementation as needed.  -Continue albuterol neb treatment as needed.  -Has had wheezing off and on but being monitored off steroids -Cardiology consulted, patient with grd 1 diastolic dysfunction and nl EF. Per Cardiology, will transition to po Lasix, may need to  continue lasix 40mg  po daily x 7 days with Kdur 76meQ po daily x 7 days.  -Will check orthostatics  PAF s/p ablation with post-op SVT -Patient had EPS/RFCA of afib on 12/2 -He developed post-op SVT overnight, that broke spontaneously -During that episode, he had hypotension that resolved with 3L IVF - likely leading to volume overload (see below) -Will continue Tikosyn and Eliquis per Cardiology's recommendations.   Acute on chronic diastolic CHF -7/25 echo with grade 2 diastolic dysfunction -He developed hypotension during his episode of SVT and was given 3L IVF -He was diuresed with 40 mg IV Lasix prior to d/c -He had ongoing SOB episodically since d/c -CXR with possible pulmonary edema -CT with mild pulmonary edema pattern as well as ?small infiltrate vs. Scarring -He had diuresis with Lasix 40 mg IV BID, will transition to po lasix 40mg  daily today. -Repeat 2D echo with grd1 diastolic dysfunction, nl EF.  -Cardiology consulted, recommended possible discharge today but patient remains symptomatic, lightheaded. Will monitor overnight.    AMS, concern for DTs -Confusion was new, poa, and wife denies h/o dementia -On chart review, patient was hospitalized in 2015 and developed DTs -He drinks 2-3 drinks nightly but has been able to drink only 1 1/2 drinks total since Tuesday night -Suspect that his currently AMS is related to ETOH witdrawal -Mentation is improving today.  -Continue CIWA protocol with MVI, thiamine, folate, and Ativan -Continue MagOx  CAD -Prior h/o CAD -MI from clot during the procedure is a consideration -His initial troponin is quite elevated - but this may be related to his recent ablation procedure -EKG without apparent ischemic changes -Cardiology consulted, no  need for further diagnostics at this time -Continue Lipitor  HTN -Continue Avapro, Lopressor  Lung cancer -Stage 1B NSCLC in 2/20 -s/p R VATS with RLL superior segmentectomy and LN  dissection in 3/20 -Currently on observation status -CT without clear concerns for recurrence -This is a small effusion and also LAD which appears to be reactive in nature -For now, continue outpatient onc f/u  Physical debility Patient feeling weak. Needs PT eval which has been ordered.     DVT prophylaxis: Eliquis Code Status: Full Family Communication: None at the bedside Disposition Plan: May be ready for dc in 24-48 hours if symptoms of dyspnea (likely due to pna) and weakness improve.   Consultants:   Cardiology   Procedures: None Antimicrobials:  Cefepime 12/5>> Vancomycin 12/5>>12/5 Metronidazole 12/5>>12/5  Subjective: Patient reports that he has a cough. Denies fever or chills. Reports that his thinking is clearer today. He feels weak and felt lightheaded while walking this afternoon. He reports not being able to walk for the past 4 days due to this dyspnea, today was the first day that he was able to walk (with assistance from his nurse).   Objective: Vitals:   02/14/19 1745 02/14/19 2053 02/15/19 0421 02/15/19 1404  BP:  121/66 123/80 114/70  Pulse: 74 83 79 62  Resp:  20 18 18   Temp:  97.7 F (36.5 C) 97.7 F (36.5 C) (!) 97.5 F (36.4 C)  TempSrc:  Oral Oral Oral  SpO2: 99% 98% 96% 96%  Weight:   97.3 kg     Intake/Output Summary (Last 24 hours) at 02/15/2019 1416 Last data filed at 02/15/2019 1200 Gross per 24 hour  Intake 300 ml  Output 1500 ml  Net -1200 ml   Filed Weights   02/15/19 0421  Weight: 97.3 kg    Examination:  General exam: Appears calm and comfortable. Hard of hearing.  Respiratory system: Clear to auscultation. Respiratory effort normal. Cardiovascular system: S1 & S2 heard, RRR. No JVD. No pedal edema. Gastrointestinal system: Abdomen is nondistended, soft and nontender. Central nervous system: Alert and oriented x3. No focal neurological deficits. Extremities: Symmetric 5 x 5 power. Skin: No rashes, lesions or  ulcers Psychiatry: Judgement and insight appear normal. Mood & affect appropriate.     Data Reviewed: I have personally reviewed following labs and imaging studies  CBC: Recent Labs  Lab 02/14/19 0245 02/14/19 0812 02/15/19 0415  WBC 10.5  --  13.9*  NEUTROABS 7.6  --  11.3*  HGB 11.2* 11.2* 10.8*  HCT 34.8* 33.0* 33.1*  MCV 99.4  --  98.8  PLT 148*  --  242   Basic Metabolic Panel: Recent Labs  Lab 02/12/19 0731 02/14/19 0245 02/14/19 0812 02/14/19 1300 02/15/19 0415  NA 139 140 138 138 141  K 4.0 4.0 4.2 4.0 4.0  CL 107 107  --  105 104  CO2 22 25  --  24 28  GLUCOSE 135* 127*  --  247* 158*  BUN 16 15  --  15 27*  CREATININE 1.03 0.93  --  0.92 1.19  CALCIUM 8.4* 8.9  --  8.8* 9.3  MG 1.8  --   --   --   --    GFR: Estimated Creatinine Clearance: 59.8 mL/min (by C-G formula based on SCr of 1.19 mg/dL). Liver Function Tests: Recent Labs  Lab 02/14/19 0245  AST 66*  ALT 64*  ALKPHOS 115  BILITOT 0.7  PROT 6.0*  ALBUMIN 3.6   No results  for input(s): LIPASE, AMYLASE in the last 168 hours. No results for input(s): AMMONIA in the last 168 hours. Coagulation Profile: Recent Labs  Lab 02/14/19 0245  INR 1.5*   Cardiac Enzymes: No results for input(s): CKTOTAL, CKMB, CKMBINDEX, TROPONINI in the last 168 hours. BNP (last 3 results) No results for input(s): PROBNP in the last 8760 hours. HbA1C: No results for input(s): HGBA1C in the last 72 hours. CBG: No results for input(s): GLUCAP in the last 168 hours. Lipid Profile: No results for input(s): CHOL, HDL, LDLCALC, TRIG, CHOLHDL, LDLDIRECT in the last 72 hours. Thyroid Function Tests: No results for input(s): TSH, T4TOTAL, FREET4, T3FREE, THYROIDAB in the last 72 hours. Anemia Panel: No results for input(s): VITAMINB12, FOLATE, FERRITIN, TIBC, IRON, RETICCTPCT in the last 72 hours. Sepsis Labs: Recent Labs  Lab 02/14/19 0249 02/14/19 0544 02/14/19 1103 02/15/19 0415  PROCALCITON  --   --  <0.10  <0.10  LATICACIDVEN 1.0 1.4  --   --     Recent Results (from the past 240 hour(s))  Novel Coronavirus, NAA (Hosp order, Send-out to Ref Lab; TAT 18-24 hrs     Status: None   Collection Time: 02/07/19  4:44 PM   Specimen: Nasopharyngeal Swab; Respiratory  Result Value Ref Range Status   SARS-CoV-2, NAA NOT DETECTED NOT DETECTED Final    Comment: (NOTE) This nucleic acid amplification test was developed and its performance characteristics determined by [employer]. Nucleic acid amplification tests include PCR and TMA. This test has not been FDA cleared or approved. This test has been authorized by FDA under an Emergency Use Authorization (EUA). This test is only authorized for the duration of time the declaration that circumstances exist justifying the authorization of the emergency use of in vitro diagnostic tests for detection of SARS-CoV-2 virus and/or diagnosis of COVID-19 infection under section 564(b)(1) of the Act, 21 U.S.C. 299BZJ-6(R) (1), unless the authorization is terminated or revoked sooner. When diagnostic testing is negative, the possibility of a false negative result should be considered in the context of a patient's recent exposures and the presence of clinical signs and symptoms consistent with COVID-19. An individual without symptoms of COVID- 19 and who is not shedding SARS-CoV-2 vi rus would expect to have a negative (not detected) result in this assay. Performed At: [facility] [address] [place], [place] [id number] Rush Farmer MD BP:[id number]    [place]  Final    Comment: Performed at [facility], [place] [address] [place], [place] [address]  Urine culture     Status: None   Collection Time: 02/14/19  2:49 AM   Specimen: In/Out Cath Urine  Result Value Ref Range Status   Specimen Description IN/OUT CATH URINE  Final   Special Requests NONE  Final   Culture   Final    NO GROWTH Performed at [facility], [place] [address] [place], [place] [address]    Report Status 02/15/2019 FINAL  Final  Blood Culture (routine x 2)     Status: None (Preliminary result)   Collection Time: 02/14/19  2:50 AM   Specimen: BLOOD  Result Value Ref Range Status   Specimen Description BLOOD LEFT HAND  Final   Special Requests   Final    BOTTLES DRAWN AEROBIC AND ANAEROBIC Blood Culture results may not be optimal due to an inadequate volume of blood received in culture bottles   Culture   Final    NO GROWTH 1 DAY Performed  at [facility], [place] [address], [place], [place] [address]    Report Status PENDING  Incomplete  Blood Culture (routine x 2)     Status: None (Preliminary result)   Collection Time: 02/14/19  2:50 AM   Specimen: BLOOD  Result Value Ref Range Status   Specimen Description BLOOD RIGHT HAND  Final   Special Requests   Final    BOTTLES DRAWN AEROBIC AND ANAEROBIC Blood Culture results may not be optimal due to an inadequate volume of blood received in culture bottles   Culture   Final    NO GROWTH 1 DAY Performed at [facility], [place] [address] [place], [place] [address]    Report Status PENDING  Incomplete  SARS CORONAVIRUS 2 (TAT 6-24 HRS) Nasopharyngeal Nasopharyngeal Swab     Status: None   Collection Time: 02/14/19  3:36 AM   Specimen: Nasopharyngeal Swab  Result Value Ref Range Status   SARS Coronavirus 2 NEGATIVE NEGATIVE Final    Comment: (NOTE) SARS-CoV-2 target nucleic acids are NOT DETECTED. The SARS-CoV-2 RNA is generally detectable in upper and lower respiratory specimens during the acute phase of infection. Negative results do not preclude SARS-CoV-2 infection, do not rule out co-infections with other pathogens, and should not be used as the sole basis for treatment or other patient management decisions. Negative results must be combined with clinical observations, patient history, and epidemiological information. The expected result is  Negative. Fact Sheet for Patients: SugarRoll.be Fact Sheet for Healthcare Providers: https://www.woods-mathews.com/ This test is not yet approved or cleared by the [place] FDA and  has been authorized for detection and/or diagnosis of SARS-CoV-2 by FDA under an Emergency Use Authorization (EUA). This EUA will remain  in effect (meaning this test can be used) for the duration of the COVID-19 declaration under Section 56 4(b)(1) of the Act, 21 U.S.C. section 360bbb-3(b)(1), unless the authorization is terminated or revoked sooner. Performed at [facility], [place] [address] [place], [place] [address]          Radiology Studies: Dg Neck Soft Tissue  Result Date: 02/14/2019 CLINICAL DATA:  Initial evaluation for acute stridor, shortness of breath. EXAM: NECK SOFT TISSUES - 1+ VIEW COMPARISON:  None. FINDINGS: Epiglottis within normal limits. Question mild retropharyngeal/prevertebral soft tissue fullness at the level of C5-6, measuring up to approximately 2 cm. No retropharyngeal gas. Small linear radiopaque density seen just anterior to the C3 vertebral body felt to be related to thyroid or cricoid calcification. No radiopaque foreign body. Advanced multilevel degenerative spondylosis, most notable at C3-4. Remainder of the visualized soft tissues of the neck demonstrate no acute finding. Probable vascular calcifications overlie the region of both carotid bifurcations. Scattered peribronchial thickening seen within the visualized upper lungs. Probable superimposed right upper lobe atelectatic changes. IMPRESSION: 1. Question mild retropharyngeal soft tissue fullness at the level of C5-6, which could reflect edema and/or effusion. No frank retropharyngeal gas. Normal epiglottis. 2. Aortic Atherosclerosis (ICD10-I70.0). 3. Moderate to advanced multilevel cervical spondylosis, most notable at C3-4. Results discussed by telephone at the time of  interpretation on 02/14/2019 at 3 STIR:41 am to [place]. Electronically Signed   By: Jeannine Boga M.D.   On: 02/14/2019 03:42   Ct Soft Tissue Neck W Contrast  Result Date: 02/14/2019 CLINICAL DATA:  Initial evaluation for acute sore throat, stridor. EXAM: CT NECK WITH CONTRAST TECHNIQUE: Multidetector CT imaging of the neck was performed using the standard protocol following the bolus administration  of intravenous contrast. CONTRAST:  174mL OMNIPAQUE IOHEXOL 350 MG/ML SOLN COMPARISON:  Prior radiograph from earlier same day. FINDINGS: Pharynx and larynx: Oral cavity within normal limits without discrete mass or collection. No acute abnormality about the dentition. [place] tonsils symmetric and within normal limits. Few punctate calcified tonsilliths noted on the right. Parapharyngeal fat maintained. Nasopharynx and the remainder of the oropharynx within normal limits. Epiglottis normal. Vallecula clear. Remainder of the hypopharynx and supraglottic larynx within normal limits. No retropharyngeal collection, effusion, or significant soft tissue swelling. Previously question prevertebral/retropharyngeal fullness likely related to positioning and/or summation of shadows on prior radiograph. True cords symmetric and normal. Subglottic airway widely patent and clear. Salivary glands: Salivary glands including the parotid and submandibular glands within normal limits. Thyroid: Thyroid within normal limits. No discrete thyroid nodule or mass. Lymph nodes: No pathologically enlarged lymph nodes identified within the neck. Vascular: Moderate atherosclerotic change present about the aortic arch, carotid bifurcations, and carotid siphons. Normal intravascular enhancement seen throughout the neck. Limited intracranial: Age-related cerebral atrophy. Otherwise unremarkable. Visualized orbits: Globes and orbital soft tissues within normal limits. Mastoids and visualized paranasal sinuses: Chronic mucosal  thickening noted within the ethmoidal air cells, sphenoid sinuses, and maxillary sinuses. Superimposed air-fluid levels noted within the sphenoid sinuses, suggesting acute on chronic disease. Mastoid air cells and middle ear cavities well pneumatized and free of fluid. Skeleton: No acute osseous abnormality. No discrete lytic or blastic osseous lesions. Moderate multilevel cervical spondylosis, most notable at C3-4 and C5-6. Upper chest: Small layering bilateral pleural effusions, right greater than left, partially visualized. Associated scattered atelectatic changes seen within the visualized lungs. Partially visualized upper chest otherwise demonstrates no acute finding. Other: None. IMPRESSION: 1. Negative CT of the neck. No discrete mass or collection identified. Previously questioned prevertebral/retropharyngeal fullness likely related to positioning and/or summation of shadows on prior radiograph. 2. Small layering bilateral pleural effusions, right greater than left, partially visualized. 3. Chronic pan sinusitis with superimposed air-fluid levels within the sphenoid sinuses, suggesting acute on chronic disease. 4.  Aortic Atherosclerosis (ICD10-I70.0). Electronically Signed   By: Jeannine Boga M.D.   On: 02/14/2019 05:22   Ct Angio Chest Pe W And/or Wo Contrast  Result Date: 02/14/2019 CLINICAL DATA:  Short of breath, recent cardiac ablation for supraventricular tachycardia. EXAM: CT ANGIOGRAPHY CHEST WITH CONTRAST TECHNIQUE: Multidetector CT imaging of the chest was performed using the standard protocol during bolus administration of intravenous contrast. Multiplanar CT image reconstructions and MIPs were obtained to evaluate the vascular anatomy. CONTRAST:  111mL OMNIPAQUE IOHEXOL 350 MG/ML SOLN COMPARISON:  Chest CT 01/02/2019 FINDINGS: Cardiovascular: No filling defects within the pulmonary arteries to suggest acute pulmonary embolism. No acute findings of the aorta or great vessels. No  pericardial fluid. Coronary artery calcification and aortic atherosclerotic calcification. Mediastinum/Nodes: No axillary supraclavicular adenopathy. Mild RIGHT hilar adenopathy with a 14 mm hilar lymph node. Lungs/Pleura: New small RIGHT pleural effusion. Small focus of consolidation at the RIGHT lung base measuring 2.5 cm (image 94/6). There is mild interlobular septal thickening in the lungs. Mild ground-glass pattern lungs additionally. Upper Abdomen: Limited view of the liver, kidneys, pancreas are unremarkable. Normal adrenal glands. Nonobstructing calculus in the upper pole of the LEFT kidney measuring 3 mm. Musculoskeletal: No aggressive osseous lesion Review of the MIP images confirms the above findings. IMPRESSION: 1. No evidence acute pulmonary embolism. 2. New small RIGHT pleural effusion.  Mild pulmonary edema pattern. 3. Small focus of consolidation at the RIGHT lung base is favored atelectasis. Cannot  exclude small focus of infection. Infarction unlikely. 4. Mild RIGHT hilar adenopathy is likely reactive. 5. Aortic Atherosclerosis (ICD10-I70.0). Electronically Signed   By: Suzy Bouchard M.D.   On: 02/14/2019 05:08   Dg Chest Portable 1 View  Result Date: 02/14/2019 CLINICAL DATA:  Initial evaluation for acute shortness of breath. EXAM: PORTABLE CHEST 1 VIEW COMPARISON:  Prior radiograph from 07/24/2018. FINDINGS: Mild cardiomegaly, stable. Mediastinal silhouette within normal limits. Aortic atherosclerosis. Lungs hypoinflated with elevation of the right hemidiaphragm, similar to previous. Suture line overlies the mid-lower right lung. Interval increase in prominence of the interstitial and peribronchial markings, which could reflect acute bronchiolitis or possibly mild interstitial congestion/edema. Blunting of the right costophrenic angle could reflect small effusion versus chronic pleural reaction/scarring, similar to previous. Irregular scarring and/or atelectatic changes present within the  mid and lower right lung as well. No other consolidative airspace disease. No pneumothorax. No acute osseous finding. Bilateral shoulder arthroplasties noted. IMPRESSION: 1. Interval increase in prominence of the interstitial and peribronchial markings, which could reflect acute bronchiolitis or possibly mild interstitial congestion/edema. 2. Blunting of the right costophrenic angle, which could reflect a small right pleural effusion versus chronic pleural reaction/scarring. 3. Additional scarring and/or atelectatic changes within the mid and lower right lung, stable. 4. Stable cardiomegaly with aortic atherosclerosis. Electronically Signed   By: Jeannine Boga M.D.   On: 02/14/2019 03:45        Scheduled Meds:  apixaban  5 mg Oral BID   dofetilide  250 mcg Oral BID   folic acid  1 mg Oral Daily   [START ON 02/16/2019] furosemide  40 mg Oral Daily   irbesartan  150 mg Oral Daily   magnesium oxide  400 mg Oral Daily   metoprolol tartrate  25 mg Oral BID   multivitamin with minerals  1 tablet Oral Daily   pantoprazole  40 mg Oral Daily   PARoxetine  30 mg Oral QHS   polycarbophil  1,250 mg Oral QHS   rosuvastatin  5 mg Oral Daily   sodium chloride flush  3 mL Intravenous Q12H   tamsulosin  0.4 mg Oral QHS   thiamine  100 mg Oral Daily   Or   thiamine  100 mg Intravenous Daily   Continuous Infusions:  sodium chloride     ceFEPime (MAXIPIME) IV Stopped (02/15/19 0600)     LOS: 1 day    Time spent: 25 minutes    Blain Pais, MD [facility]   If 7PM-7AM, please contact night-coverage [email or web address] Password [email or web address] 02/15/2019, 2:16 PM

## 2019-02-15 NOTE — Progress Notes (Signed)
Ambulated on RA in hallway x 100 ft. SpO2 % = 91-94% on RA. C/O mild dizziness DOE with mild audible exp wheezing. Sats recovered to 96% back sitting on side of bed on RA.

## 2019-02-15 NOTE — Progress Notes (Signed)
Progress Note  Patient Name: George Vega Date of Encounter: 02/15/2019  Primary Cardiologist:   Cristopher Peru, MD   Subjective   He says that he is breathing back to baseline. However, he is on 2 liters.  Sat 99%.  No pain.    Inpatient Medications    Scheduled Meds:  apixaban  5 mg Oral BID   dofetilide  250 mcg Oral BID   folic acid  1 mg Oral Daily   furosemide  40 mg Intravenous Q12H   irbesartan  150 mg Oral Daily   magnesium oxide  400 mg Oral Daily   metoprolol tartrate  25 mg Oral BID   multivitamin with minerals  1 tablet Oral Daily   pantoprazole  40 mg Oral Daily   PARoxetine  30 mg Oral QHS   polycarbophil  1,250 mg Oral QHS   rosuvastatin  5 mg Oral Daily   sodium chloride flush  3 mL Intravenous Q12H   tamsulosin  0.4 mg Oral QHS   thiamine  100 mg Oral Daily   Or   thiamine  100 mg Intravenous Daily   Continuous Infusions:  sodium chloride     ceFEPime (MAXIPIME) IV Stopped (02/15/19 0600)   PRN Meds: sodium chloride, acetaminophen, albuterol, LORazepam **OR** LORazepam, Melatonin, ondansetron (ZOFRAN) IV, polyvinyl alcohol, sodium chloride flush   Vital Signs    Vitals:   02/14/19 1655 02/14/19 1745 02/14/19 2053 02/15/19 0421  BP: 112/65  121/66 123/80  Pulse: 75 74 83 79  Resp: (!) 23  20 18   Temp: 98.4 F (36.9 C)  97.7 F (36.5 C) 97.7 F (36.5 C)  TempSrc:   Oral Oral  SpO2: 100% 99% 98% 96%  Weight:    97.3 kg    Intake/Output Summary (Last 24 hours) at 02/15/2019 0733 Last data filed at 02/15/2019 0600 Gross per 24 hour  Intake 300 ml  Output 2700 ml  Net -2400 ml   Filed Weights   02/15/19 0421  Weight: 97.3 kg    Telemetry    NSR - Personally Reviewed  ECG    NA - Personally Reviewed  Physical Exam   GEN: No acute distress.   Neck: No  JVD Cardiac: RRR, no murmurs, rubs, or gallops.  Respiratory: Clear  to auscultation bilaterally. GI: Soft, nontender, non-distended  MS: No  edema; No  deformity. Neuro:  Nonfocal  Psych: Normal affect   Labs    Chemistry Recent Labs  Lab 02/14/19 0245 02/14/19 0812 02/14/19 1300 02/15/19 0415  NA 140 138 138 141  K 4.0 4.2 4.0 4.0  CL 107  --  105 104  CO2 25  --  24 28  GLUCOSE 127*  --  247* 158*  BUN 15  --  15 27*  CREATININE 0.93  --  0.92 1.19  CALCIUM 8.9  --  8.8* 9.3  PROT 6.0*  --   --   --   ALBUMIN 3.6  --   --   --   AST 66*  --   --   --   ALT 64*  --   --   --   ALKPHOS 115  --   --   --   BILITOT 0.7  --   --   --   GFRNONAA >60  --  >60 58*  GFRAA >60  --  >60 >60  ANIONGAP 8  --  9 9     Hematology Recent Labs  Lab 02/14/19  0245 02/14/19 0812 02/15/19 0415  WBC 10.5  --  13.9*  RBC 3.50*  --  3.35*  HGB 11.2* 11.2* 10.8*  HCT 34.8* 33.0* 33.1*  MCV 99.4  --  98.8  MCH 32.0  --  32.2  MCHC 32.2  --  32.6  RDW 13.2  --  13.0  PLT 148*  --  152    Cardiac EnzymesNo results for input(s): TROPONINI in the last 168 hours. No results for input(s): TROPIPOC in the last 168 hours.   BNP Recent Labs  Lab 02/14/19 0245  BNP 208.6*     DDimer No results for input(s): DDIMER in the last 168 hours.   Radiology    Dg Neck Soft Tissue  Result Date: 02/14/2019 CLINICAL DATA:  Initial evaluation for acute stridor, shortness of breath. EXAM: NECK SOFT TISSUES - 1+ VIEW COMPARISON:  None. FINDINGS: Epiglottis within normal limits. Question mild retropharyngeal/prevertebral soft tissue fullness at the level of C5-6, measuring up to approximately 2 cm. No retropharyngeal gas. Small linear radiopaque density seen just anterior to the C3 vertebral body felt to be related to thyroid or cricoid calcification. No radiopaque foreign body. Advanced multilevel degenerative spondylosis, most notable at C3-4. Remainder of the visualized soft tissues of the neck demonstrate no acute finding. Probable vascular calcifications overlie the region of both carotid bifurcations. Scattered peribronchial thickening seen  within the visualized upper lungs. Probable superimposed right upper lobe atelectatic changes. IMPRESSION: 1. Question mild retropharyngeal soft tissue fullness at the level of C5-6, which could reflect edema and/or effusion. No frank retropharyngeal gas. Normal epiglottis. 2. Aortic Atherosclerosis (ICD10-I70.0). 3. Moderate to advanced multilevel cervical spondylosis, most notable at C3-4. Results discussed by telephone at the time of interpretation on 02/14/2019 at 3 STIR:41 am to [place]. Electronically Signed   By: Jeannine Boga M.D.   On: 02/14/2019 03:42   Ct Soft Tissue Neck W Contrast  Result Date: 02/14/2019 CLINICAL DATA:  Initial evaluation for acute sore throat, stridor. EXAM: CT NECK WITH CONTRAST TECHNIQUE: Multidetector CT imaging of the neck was performed using the standard protocol following the bolus administration of intravenous contrast. CONTRAST:  155mL OMNIPAQUE IOHEXOL 350 MG/ML SOLN COMPARISON:  Prior radiograph from earlier same day. FINDINGS: Pharynx and larynx: Oral cavity within normal limits without discrete mass or collection. No acute abnormality about the dentition. [place]ine tonsils symmetric and within normal limits. Few punctate calcified tonsilliths noted on the right. Parapharyngeal fat maintained. Nasopharynx and the remainder of the oropharynx within normal limits. Epiglottis normal. Vallecula clear. Remainder of the hypopharynx and supraglottic larynx within normal limits. No retropharyngeal collection, effusion, or significant soft tissue swelling. Previously question prevertebral/retropharyngeal fullness likely related to positioning and/or summation of shadows on prior radiograph. True cords symmetric and normal. Subglottic airway widely patent and clear. Salivary glands: Salivary glands including the parotid and submandibular glands within normal limits. Thyroid: Thyroid within normal limits. No discrete thyroid nodule or mass. Lymph nodes: No  pathologically enlarged lymph nodes identified within the neck. Vascular: Moderate atherosclerotic change present about the aortic arch, carotid bifurcations, and carotid siphons. Normal intravascular enhancement seen throughout the neck. Limited intracranial: Age-related cerebral atrophy. Otherwise unremarkable. Visualized orbits: Globes and orbital soft tissues within normal limits. Mastoids and visualized paranasal sinuses: Chronic mucosal thickening noted within the ethmoidal air cells, sphenoid sinuses, and maxillary sinuses. Superimposed air-fluid levels noted within the sphenoid sinuses, suggesting acute on chronic disease. Mastoid air cells and middle ear cavities well pneumatized and free of fluid. Skeleton:  No acute osseous abnormality. No discrete lytic or blastic osseous lesions. Moderate multilevel cervical spondylosis, most notable at C3-4 and C5-6. Upper chest: Small layering bilateral pleural effusions, right greater than left, partially visualized. Associated scattered atelectatic changes seen within the visualized lungs. Partially visualized upper chest otherwise demonstrates no acute finding. Other: None. IMPRESSION: 1. Negative CT of the neck. No discrete mass or collection identified. Previously questioned prevertebral/retropharyngeal fullness likely related to positioning and/or summation of shadows on prior radiograph. 2. Small layering bilateral pleural effusions, right greater than left, partially visualized. 3. Chronic pan sinusitis with superimposed air-fluid levels within the sphenoid sinuses, suggesting acute on chronic disease. 4.  Aortic Atherosclerosis (ICD10-I70.0). Electronically Signed   By: Jeannine Boga M.D.   On: 02/14/2019 05:22   Ct Angio Chest Pe W And/or Wo Contrast  Result Date: 02/14/2019 CLINICAL DATA:  Short of breath, recent cardiac ablation for supraventricular tachycardia. EXAM: CT ANGIOGRAPHY CHEST WITH CONTRAST TECHNIQUE: Multidetector CT imaging of the  chest was performed using the standard protocol during bolus administration of intravenous contrast. Multiplanar CT image reconstructions and MIPs were obtained to evaluate the vascular anatomy. CONTRAST:  166mL OMNIPAQUE IOHEXOL 350 MG/ML SOLN COMPARISON:  Chest CT 01/02/2019 FINDINGS: Cardiovascular: No filling defects within the pulmonary arteries to suggest acute pulmonary embolism. No acute findings of the aorta or great vessels. No pericardial fluid. Coronary artery calcification and aortic atherosclerotic calcification. Mediastinum/Nodes: No axillary supraclavicular adenopathy. Mild RIGHT hilar adenopathy with a 14 mm hilar lymph node. Lungs/Pleura: New small RIGHT pleural effusion. Small focus of consolidation at the RIGHT lung base measuring 2.5 cm (image 94/6). There is mild interlobular septal thickening in the lungs. Mild ground-glass pattern lungs additionally. Upper Abdomen: Limited view of the liver, kidneys, pancreas are unremarkable. Normal adrenal glands. Nonobstructing calculus in the upper pole of the LEFT kidney measuring 3 mm. Musculoskeletal: No aggressive osseous lesion Review of the MIP images confirms the above findings. IMPRESSION: 1. No evidence acute pulmonary embolism. 2. New small RIGHT pleural effusion.  Mild pulmonary edema pattern. 3. Small focus of consolidation at the RIGHT lung base is favored atelectasis. Cannot exclude small focus of infection. Infarction unlikely. 4. Mild RIGHT hilar adenopathy is likely reactive. 5. Aortic Atherosclerosis (ICD10-I70.0). Electronically Signed   By: Suzy Bouchard M.D.   On: 02/14/2019 05:08   Dg Chest Portable 1 View  Result Date: 02/14/2019 CLINICAL DATA:  Initial evaluation for acute shortness of breath. EXAM: PORTABLE CHEST 1 VIEW COMPARISON:  Prior radiograph from 07/24/2018. FINDINGS: Mild cardiomegaly, stable. Mediastinal silhouette within normal limits. Aortic atherosclerosis. Lungs hypoinflated with elevation of the right  hemidiaphragm, similar to previous. Suture line overlies the mid-lower right lung. Interval increase in prominence of the interstitial and peribronchial markings, which could reflect acute bronchiolitis or possibly mild interstitial congestion/edema. Blunting of the right costophrenic angle could reflect small effusion versus chronic pleural reaction/scarring, similar to previous. Irregular scarring and/or atelectatic changes present within the mid and lower right lung as well. No other consolidative airspace disease. No pneumothorax. No acute osseous finding. Bilateral shoulder arthroplasties noted. IMPRESSION: 1. Interval increase in prominence of the interstitial and peribronchial markings, which could reflect acute bronchiolitis or possibly mild interstitial congestion/edema. 2. Blunting of the right costophrenic angle, which could reflect a small right pleural effusion versus chronic pleural reaction/scarring. 3. Additional scarring and/or atelectatic changes within the mid and lower right lung, stable. 4. Stable cardiomegaly with aortic atherosclerosis. Electronically Signed   By: Jeannine Boga M.D.   On: 02/14/2019  03:45    Cardiac Studies   TTE: 02/14/2019  IMPRESSIONS  1. Left ventricular ejection fraction, by visual estimation, is 60 to 65%. The left ventricle has normal function. There is no left ventricular hypertrophy. 2. Left ventricular diastolic parameters are consistent with Grade I diastolic dysfunction (impaired relaxation). 3. Global right ventricle has normal systolic function.The right ventricular size is normal. No increase in right ventricular wall thickness. 4. Left atrial size was normal. 5. Right atrial size was normal. 6. The mitral valve is normal in structure. Mild mitral valve regurgitation. No evidence of mitral stenosis. 7. The tricuspid valve is normal in structure. Tricuspid valve regurgitation is mild. 8. The aortic valve is normal in structure.  Aortic valve regurgitation is not visualized. No evidence of aortic valve sclerosis or stenosis. 9. The pulmonic valve was normal in structure. Pulmonic valve regurgitation is not visualized. 10. Mildly elevated pulmonary artery systolic pressure. 11. The inferior vena cava is normal in size with greater than 50% respiratory variability, suggesting right atrial pressure of 3 mmHg.  Cath: 01/16/2019   Ost Cx to Prox Cx lesion is 50% stenosed.  Mid LM to Dist LM lesion is 25% stenosed.  Mid LAD lesion is 100% stenosed.  LV end diastolic pressure is normal.  There is no aortic valve stenosis.   Patient Profile     78 y.o. male with a hx of atrial fibrillation, hyperlipidemia and diastolic CHF who is being seen for the evaluation of shortness of breath at the request of Dr. Lorin Mercy.  Assessment & Plan    ACUTE ON CHRONIC DIASTOLIC DYSFUNCTION.  Net negative 2.4 liters this admission.  OK to send home.  Probably 40 mg daily for one week and then go back to 3 x per week as he was doing. Potassium is OK but He should get 20 meq to take daily while he is taking the Lasix daily then stop.  Get BMET in two weeks.   I asked nursing to ambulate in the hallway without O2 and record the sats.   ATRIAL FIB:   Continue anticoagulation and Tikosyn.       CAD:  Chronic LAD occlusion.  Continue medical management.   Elevated troponin reflective of this.    For questions or updates, please contact [place] Please consult [email or web address] for contact info under Cardiology/STEMI.   Signed, Minus Breeding, MD  02/15/2019, 7:33 AM

## 2019-02-16 ENCOUNTER — Other Ambulatory Visit: Payer: Self-pay | Admitting: Physician Assistant

## 2019-02-16 ENCOUNTER — Encounter (HOSPITAL_COMMUNITY): Payer: Self-pay | Admitting: Certified Registered Nurse Anesthetist

## 2019-02-16 DIAGNOSIS — I4891 Unspecified atrial fibrillation: Secondary | ICD-10-CM

## 2019-02-16 DIAGNOSIS — I10 Essential (primary) hypertension: Secondary | ICD-10-CM

## 2019-02-16 DIAGNOSIS — I2510 Atherosclerotic heart disease of native coronary artery without angina pectoris: Secondary | ICD-10-CM

## 2019-02-16 DIAGNOSIS — J9601 Acute respiratory failure with hypoxia: Secondary | ICD-10-CM | POA: Diagnosis not present

## 2019-02-16 DIAGNOSIS — Z9889 Other specified postprocedural states: Secondary | ICD-10-CM | POA: Diagnosis not present

## 2019-02-16 DIAGNOSIS — I5033 Acute on chronic diastolic (congestive) heart failure: Secondary | ICD-10-CM

## 2019-02-16 DIAGNOSIS — R778 Other specified abnormalities of plasma proteins: Secondary | ICD-10-CM | POA: Diagnosis not present

## 2019-02-16 DIAGNOSIS — F10231 Alcohol dependence with withdrawal delirium: Secondary | ICD-10-CM | POA: Diagnosis not present

## 2019-02-16 LAB — CBC
HCT: 33.5 % — ABNORMAL LOW (ref 39.0–52.0)
Hemoglobin: 10.7 g/dL — ABNORMAL LOW (ref 13.0–17.0)
MCH: 31.8 pg (ref 26.0–34.0)
MCHC: 31.9 g/dL (ref 30.0–36.0)
MCV: 99.4 fL (ref 80.0–100.0)
Platelets: 168 10*3/uL (ref 150–400)
RBC: 3.37 MIL/uL — ABNORMAL LOW (ref 4.22–5.81)
RDW: 13.3 % (ref 11.5–15.5)
WBC: 9.8 10*3/uL (ref 4.0–10.5)
nRBC: 0 % (ref 0.0–0.2)

## 2019-02-16 LAB — BASIC METABOLIC PANEL
Anion gap: 11 (ref 5–15)
BUN: 28 mg/dL — ABNORMAL HIGH (ref 8–23)
CO2: 28 mmol/L (ref 22–32)
Calcium: 8.9 mg/dL (ref 8.9–10.3)
Chloride: 106 mmol/L (ref 98–111)
Creatinine, Ser: 1.07 mg/dL (ref 0.61–1.24)
GFR calc Af Amer: >60 mL/min (ref >60)
GFR calc non Af Amer: >60 mL/min (ref >60)
Glucose, Bld: 112 mg/dL — ABNORMAL HIGH (ref 70–99)
Potassium: 3.4 mmol/L — ABNORMAL LOW (ref 3.5–5.1)
Sodium: 145 mmol/L (ref 135–145)

## 2019-02-16 LAB — PROCALCITONIN: Procalcitonin: <0.1 ng/mL

## 2019-02-16 MED ORDER — MAGNESIUM SULFATE 2 GM/50ML IV SOLN
2.0000 g | Freq: Once | INTRAVENOUS | Status: AC
  Administered 2019-02-16: 10:00:00 2 g via INTRAVENOUS
  Filled 2019-02-16: qty 50

## 2019-02-16 MED ORDER — ORAL CARE MOUTH RINSE: 15.0000 mL | Freq: Two times a day (BID) | OROMUCOSAL | Status: DC

## 2019-02-16 MED ORDER — POTASSIUM CHLORIDE CRYS ER 20 MEQ PO TBCR
40.0000 meq | EXTENDED_RELEASE_TABLET | Freq: Once | ORAL | Status: AC
  Administered 2019-02-16: 40 meq via ORAL
  Filled 2019-02-16: qty 2

## 2019-02-16 MED ORDER — METOPROLOL TARTRATE 25 MG PO TABS
25.0000 mg | ORAL_TABLET | Freq: Once | ORAL | Status: AC
  Administered 2019-02-16: 25 mg via ORAL
  Filled 2019-02-16: qty 1

## 2019-02-16 NOTE — Progress Notes (Signed)
Patient notified RN HR increased, found to be in afib, HR 130-140 bpm, BP 109/69; c/o feeling tightness in chest; denies dizziness or SOB.  EKG done.   R. Barrett PA and Dr. Bonner Puna notified.  Order for lopressor po and IV magnesium. R. Barrett PA here to see patient.  Will keep NPO for now.

## 2019-02-16 NOTE — Progress Notes (Addendum)
Progress Note  Patient Name: George Vega Date of Encounter: 02/16/2019  Primary Cardiologist:   Cristopher Peru, MD   Subjective   Pt could feel it when he converted, had chest tightness up to 4/10 (currently 1/10) and gets SOB w/ any exertion  Inpatient Medications    Scheduled Meds: . apixaban  5 mg Oral BID  . dofetilide  250 mcg Oral BID  . folic acid  1 mg Oral Daily  . furosemide  40 mg Oral Daily  . irbesartan  150 mg Oral Daily  . magnesium oxide  400 mg Oral Daily  . metoprolol tartrate  25 mg Oral BID  . metoprolol tartrate  25 mg Oral Once  . multivitamin with minerals  1 tablet Oral Daily  . pantoprazole  40 mg Oral Daily  . PARoxetine  30 mg Oral QHS  . polycarbophil  1,250 mg Oral QHS  . rosuvastatin  5 mg Oral Daily  . sodium chloride flush  3 mL Intravenous Q12H  . tamsulosin  0.4 mg Oral QHS  . thiamine  100 mg Oral Daily   Or  . thiamine  100 mg Intravenous Daily   Continuous Infusions: . sodium chloride Stopped (02/15/19 2321)  . magnesium sulfate bolus IVPB     PRN Meds: sodium chloride, acetaminophen, albuterol, LORazepam **OR** LORazepam, Melatonin, ondansetron (ZOFRAN) IV, polyvinyl alcohol, sodium chloride flush   Vital Signs    Vitals:   02/15/19 2112 02/16/19 0447 02/16/19 0812 02/16/19 0930  BP:  120/78 140/77 109/69  Pulse: 76 67 67 (!) 138  Resp:  18 18   Temp:  97.8 F (36.6 C)    TempSrc:  Oral    SpO2: 92% 92% 93% 96%  Weight:  97 kg      Intake/Output Summary (Last 24 hours) at 02/16/2019 0956 Last data filed at 02/16/2019 3474 Gross per 24 hour  Intake 480 ml  Output 1480 ml  Net -1000 ml   Filed Weights   02/15/19 0421 02/16/19 0447  Weight: 97.3 kg 97 kg    Telemetry    SR>>Afib vs A flutter RVR up to 140s - Personally Reviewed  ECG    12/07 coarse fib vs flutter, HR 131 - Personally Reviewed  Physical Exam   General: Well developed, well nourished, male in mild distress Head: Eyes PERRLA, Head  normocephalic and atraumatic Lungs: rales bases bilaterally to auscultation. Heart: mostly regular R&R, S1 S2, without rub or gallop. No murmur. 4/4 extremity pulses are 2+ & equal. JVD 9 CM. Abdomen: Bowel sounds are present, abdomen soft and non-tender without masses or  hernias noted. Msk: Normal strength and tone for age. Extremities: No clubbing, cyanosis or edema.    Skin:  No rashes or lesions noted. Neuro: Alert and oriented X 3. Psych:  Good affect, responds appropriately   Labs    Chemistry Recent Labs  Lab 02/14/19 0245  02/14/19 1300 02/15/19 0415 02/16/19 0444  NA 140   < > 138 141 145  K 4.0   < > 4.0 4.0 3.4*  CL 107  --  105 104 106  CO2 25  --  24 28 28   GLUCOSE 127*  --  247* 158* 112*  BUN 15  --  15 27* 28*  CREATININE 0.93  --  0.92 1.19 1.07  CALCIUM 8.9  --  8.8* 9.3 8.9  PROT 6.0*  --   --   --   --   ALBUMIN 3.6  --   --   --   --  AST 66*  --   --   --   --   ALT 64*  --   --   --   --   ALKPHOS 115  --   --   --   --   BILITOT 0.7  --   --   --   --   GFRNONAA >60  --  >60 58* >60  GFRAA >60  --  >60 >60 >60  ANIONGAP 8  --  9 9 11    < > = values in this interval not displayed.     Hematology Recent Labs  Lab 02/14/19 0245 02/14/19 0812 02/15/19 0415 02/16/19 0444  WBC 10.5  --  13.9* 9.8  RBC 3.50*  --  3.35* 3.37*  HGB 11.2* 11.2* 10.8* 10.7*  HCT 34.8* 33.0* 33.1* 33.5*  MCV 99.4  --  98.8 99.4  MCH 32.0  --  32.2 31.8  MCHC 32.2  --  32.6 31.9  RDW 13.2  --  13.0 13.3  PLT 148*  --  152 168    Cardiac Enzymes High Sensitivity Troponin:   Recent Labs  Lab 02/14/19 0544 02/14/19 0941 02/14/19 1103  TROPONINIHS 558* 432* 379*      BNP Recent Labs  Lab 02/14/19 0245  BNP 208.6*     Radiology    No results found.  Cardiac Studies   TTE: 02/14/2019  IMPRESSIONS  1. Left ventricular ejection fraction, by visual estimation, is 60 to 65%. The left ventricle has normal function. There is no left ventricular  hypertrophy. 2. Left ventricular diastolic parameters are consistent with Grade I diastolic dysfunction (impaired relaxation). 3. Global right ventricle has normal systolic function.The right ventricular size is normal. No increase in right ventricular wall thickness. 4. Left atrial size was normal. 5. Right atrial size was normal. 6. The mitral valve is normal in structure. Mild mitral valve regurgitation. No evidence of mitral stenosis. 7. The tricuspid valve is normal in structure. Tricuspid valve regurgitation is mild. 8. The aortic valve is normal in structure. Aortic valve regurgitation is not visualized. No evidence of aortic valve sclerosis or stenosis. 9. The pulmonic valve was normal in structure. Pulmonic valve regurgitation is not visualized. 10. Mildly elevated pulmonary artery systolic pressure. 11. The inferior vena cava is normal in size with greater than 50% respiratory variability, suggesting right atrial pressure of 3 mmHg.  Cath: 01/16/2019   Ost Cx to Prox Cx lesion is 50% stenosed.  Mid LM to Dist LM lesion is 25% stenosed.  Mid LAD lesion is 100% stenosed.  LV end diastolic pressure is normal.  There is no aortic valve stenosis.   Patient Profile     78 y.o. male with a hx of atrial fibrillation, hyperlipidemia and diastolic CHF who was hospitalized for Afib ablation w/ post-proc SVT and hypotension 12/02-12/03, admitted 12/05 w/ CHF exacerbation, went into rapid Afib 12/07.  Assessment & Plan    ACUTE ON CHRONIC DIASTOLIC CHF - I/O net -3.2 L - wt 219 lbs on 12/03, normally 212 lbs or so - he believes wt at baseline - however, still w/ some volume on board, may appear worse due to RVR - will give a dose of IV Lasix 40 mg - plan is for Lasix 40 mg qd x 1 week, then resume usual dose of 40 mg 3x week - f/u and BMET arranged  ATRIAL FIB:  - s/p EPS/RFCA 12/2 w/ post-proc SVT/hypotension>>IVF - was in SR till this am>>spontaneously converted to  rapid Afib, no provocation - on Eliquis and Tikosyn - keep NPO for now, RN aware. - feel we need to pursue SR, he does not tolerate this well.  CAD:  - last cath 01/2019, LAD 100% - current chest tightness likely related to RVR - hopefully will improve as HR improves. - trop elevated on admit to 588, felt 2nd demand ischemia - continue BB, statin as tolerated, no ASA due to Eliquis   For questions or updates, please contact [place] Please consult [email or web address] for contact info under Cardiology/STEMI.   Signed, Rosaria Ferries, PA-C  02/16/2019, 9:56 AM

## 2019-02-16 NOTE — Plan of Care (Signed)
  Problem: Education: Goal: Knowledge of disease or condition will improve Outcome: Progressing   Problem: Education: Goal: Knowledge of General Education information will improve Description: Including pain rating scale, medication(s)/side effects and non-pharmacologic comfort measures Outcome: Progressing   Problem: Nutrition: Goal: Adequate nutrition will be maintained Outcome: Progressing

## 2019-02-16 NOTE — Progress Notes (Addendum)
Telemetry reviewed HR slowed to 100 or so, this looks to have regularized into a 2:1 AT, flutter He is on his Eliquis I will give an addition 22meq of K+ with a K+ this AM 3.4 on Tikosyn QT on his last EKG is hard to establish w/RVR, though looks OK Repeat EKG  QTC yesterday in SR 446ms He is getting IV mag.. 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   Little concern about whether there may not be proarrhythmic lesions  Giving rise to atypical flutter  K is low, prob from diuresis  K repleted BMET pending in am May need outpt BMET at the end of the week

## 2019-02-16 NOTE — Progress Notes (Signed)
PT Cancellation Note  Patient Details Name: ADELBERT GASPARD MRN: [id number] DOB: 1940-10-29   Cancelled Treatment:    Reason Eval/Treat Not Completed: Medical issues which prohibited therapy per chart review/RN report, patient went into A-fib in RVR this morning and has been having trouble breathing due to HR issues. RN recommends hold due to medical status, states the plan is for him to get a cardioversion tomorrow. Will follow acutely.    Windell Norfolk, DPT, PN1   Supplemental Physical Therapist [facility]    Pager [phone] Acute Rehab Office [phone]

## 2019-02-16 NOTE — Anesthesia Preprocedure Evaluation (Deleted)
Anesthesia Evaluation    Reviewed: Allergy & Precautions, Patient's Chart, lab work & pertinent test results  History of Anesthesia Complications (+) DIFFICULT AIRWAY and history of anesthetic complications  Airway        Dental   Pulmonary neg pulmonary ROS, former smoker,           Cardiovascular hypertension, Pt. on home beta blockers and Pt. on medications + CAD and +CHF  + dysrhythmias Atrial Fibrillation    '20 TTE - EF 60-65%. Grade I diastolic dysfunction (impaired relaxation). Mild MR and TR. Mildly elevated pulmonary artery systolic pressure.  '20 Cath - Ost Cx to Prox Cx lesion is 50% stenosed. Mid LM to Dist LM lesion is 25% stenosed. Mid LAD lesion is 100% stenosed.  '20 Carotid US - 1-39% b/l ICAS    Neuro/Psych  Headaches, PSYCHIATRIC DISORDERS Depression CVA    GI/Hepatic negative GI ROS, Neg liver ROS,   Endo/Other   Obesity   Renal/GU negative Renal ROS     Musculoskeletal  (+) Arthritis ,   Abdominal   Peds  Hematology  (+) anemia ,   Anesthesia Other Findings Covid negative 12/5   Reproductive/Obstetrics                             Anesthesia Physical Anesthesia Plan  ASA: III  Anesthesia Plan: General   Post-op Pain Management:    Induction: Intravenous  PONV Risk Score and Plan: 2 and Treatment may vary due to age or medical condition and Propofol infusion  Airway Management Planned: Mask and Natural Airway  Additional Equipment: None  Intra-op Plan:   Post-operative Plan:   Informed Consent:   Plan Discussed with: CRNA and Anesthesiologist  Anesthesia Plan Comments:         Anesthesia Quick Evaluation

## 2019-02-16 NOTE — Progress Notes (Signed)
PROGRESS NOTE  Brief Narrative: George Vega is a 78 y.o. male with a history of AFib s/p EPS/RFCA 08/6 complicated by postoperative SVT and hypotension for which he received IV fluids. He presented on 12/5 with hypoxia, shortness of breath, found to have CT chest with very tiny area of questionable consolidation. He was admitted for acute HFpEF, started on lasix with cardiology consultation, as well as cefepime for possible aspiration pneumonia. He has diuresed 3.2L thus far, continuing a negative balance after switch to oral lasix as recommended by cardiology. On 12/6, ambulated 128ft without hypoxia and much improved symptoms. Plan was for discharge today with cardiology follow up but developed rapid AFib this morning.   Subjective: Felt well this morning, no chest pain, dyspnea only on exertion and improved overall. Leg swelling has resolved. No current wheezing, but feels he was wheezing at some time. Stopped smoking >20 years ago, no hx COPD.   Objective: BP 109/69 (BP Location: Right Arm)   Pulse (!) 138   Temp 97.8 F (36.6 C) (Oral)   Resp 18   Wt 97 kg   SpO2 96%   BMI 30.68 kg/m   Gen: Nontoxic Pulm: Nonlabored on room air, crackles at R > L base.   CV: Rapid regular after sitting up for exam, no murmur, no JVD, no edema GI: Soft, NT, ND, +BS  Neuro: Alert and oriented. No focal deficits. Skin: No rashes, lesions or ulcers  Assessment & Plan: Acute hypoxic respiratory failure due to acute HFpEF: Negative 3.2L for admission, creatinine stable, symptoms improved no longer hypoxic. Was covered with abx, but PCT has remained negative x3 days and symptoms attributable to post-operative volume overload. Can stop abx. BUN:Cr widening. - Continue po lasix.  - Plan to prescribe albuterol MDI at discharge with plans for pulmonology follow up with formal PFTs.  - Continue ARB, BB, diuretic.  AFib with RVR: Recurrent conversion this morning prior to discharge.  - Was given usual  tikosyn and metoprolol this AM. Will give additional dose of oral metoprolol as he is in no distress and BP stable.  - RN has repaged cardiology, will need their input for further management. - Continue eliquis.   Hypokalemia:  - Supplement again today and continue regularly, goal >4 - Monitor Mg, goal >2. Supp empirically today.  History of alcohol over use, AMS, concern for DTs:  - No stigmata of DTs on exam today. Continuing CIWA while admitted.  - Thiamine, folate, MVM  CAD with LAD occlusion: Troponin elevated as expected on admission. No current chest pain, doubt ACS.  - Continue beta blocker, anticoagulation, statin. Cardiology follow up recommended.   Physical deconditioning: Partial cause for tachycardic response to activity.  - Proceed with cardiac rehabilitation as previously scheduled.  - PT eval.  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  - Continue outpatient surveillance/treatment per oncology.   George Pour, MD Pager on amion 02/16/2019, 9:38 AM

## 2019-02-17 ENCOUNTER — Other Ambulatory Visit: Payer: Self-pay | Admitting: Physician Assistant

## 2019-02-17 ENCOUNTER — Encounter (HOSPITAL_COMMUNITY)
Admission: EM | Disposition: A | Discharge status: Home / Self Care | Payer: Self-pay | Source: Home / Self Care | Attending: Family Medicine

## 2019-02-17 DIAGNOSIS — J9601 Acute respiratory failure with hypoxia: Secondary | ICD-10-CM | POA: Diagnosis not present

## 2019-02-17 DIAGNOSIS — I10 Essential (primary) hypertension: Secondary | ICD-10-CM | POA: Diagnosis not present

## 2019-02-17 DIAGNOSIS — Z9889 Other specified postprocedural states: Secondary | ICD-10-CM | POA: Diagnosis not present

## 2019-02-17 DIAGNOSIS — I5033 Acute on chronic diastolic (congestive) heart failure: Secondary | ICD-10-CM

## 2019-02-17 DIAGNOSIS — R778 Other specified abnormalities of plasma proteins: Secondary | ICD-10-CM | POA: Diagnosis not present

## 2019-02-17 DIAGNOSIS — I2510 Atherosclerotic heart disease of native coronary artery without angina pectoris: Secondary | ICD-10-CM | POA: Diagnosis not present

## 2019-02-17 DIAGNOSIS — I480 Paroxysmal atrial fibrillation: Secondary | ICD-10-CM | POA: Diagnosis not present

## 2019-02-17 LAB — BASIC METABOLIC PANEL
Anion gap: 10 (ref 5–15)
BUN: 25 mg/dL — ABNORMAL HIGH (ref 8–23)
CO2: 25 mmol/L (ref 22–32)
Calcium: 8.7 mg/dL — ABNORMAL LOW (ref 8.9–10.3)
Chloride: 108 mmol/L (ref 98–111)
Creatinine, Ser: 0.82 mg/dL (ref 0.61–1.24)
GFR calc Af Amer: >60 mL/min (ref >60)
GFR calc non Af Amer: >60 mL/min (ref >60)
Glucose, Bld: 111 mg/dL — ABNORMAL HIGH (ref 70–99)
Potassium: 4.3 mmol/L (ref 3.5–5.1)
Sodium: 143 mmol/L (ref 135–145)

## 2019-02-17 LAB — MAGNESIUM: Magnesium: 2.1 mg/dL (ref 1.7–2.4)

## 2019-02-17 SURGERY — CARDIOVERSION
Anesthesia: General

## 2019-02-17 MED ORDER — POTASSIUM CHLORIDE CRYS ER 20 MEQ PO TBCR
40.0000 meq | EXTENDED_RELEASE_TABLET | Freq: Every day | ORAL | Status: DC
  Administered 2019-02-17: 40 meq via ORAL
  Filled 2019-02-17: qty 2

## 2019-02-17 MED ORDER — MAGNESIUM OXIDE 400 MG PO TABS: 400.0000 mg | ORAL_TABLET | Freq: Two times a day (BID) | ORAL | 0 refills | Status: AC

## 2019-02-17 MED ORDER — FUROSEMIDE 40 MG PO TABS: ORAL_TABLET | ORAL | 0 refills | Status: DC

## 2019-02-17 MED ORDER — POTASSIUM CHLORIDE ER 20 MEQ PO TBCR: EXTENDED_RELEASE_TABLET | ORAL | 0 refills | Status: DC

## 2019-02-17 MED ORDER — MAGNESIUM OXIDE 400 (241.3 MG) MG PO TABS
400.0000 mg | ORAL_TABLET | Freq: Two times a day (BID) | ORAL | Status: DC
  Administered 2019-02-17: 400 mg via ORAL
  Filled 2019-02-17: qty 1

## 2019-02-17 NOTE — Progress Notes (Addendum)
Progress Note  Patient Name: George Vega Date of Encounter: 02/17/2019  Primary Cardiologist:   Cristopher Peru, MD   Subjective   Denies any chest pain or SOB.    Inpatient Medications    Scheduled Meds: . apixaban  5 mg Oral BID  . dofetilide  250 mcg Oral BID  . folic acid  1 mg Oral Daily  . furosemide  40 mg Oral Daily  . irbesartan  150 mg Oral Daily  . magnesium oxide  400 mg Oral Daily  . mouth rinse  15 mL Mouth Rinse BID  . metoprolol tartrate  25 mg Oral BID  . multivitamin with minerals  1 tablet Oral Daily  . pantoprazole  40 mg Oral Daily  . PARoxetine  30 mg Oral QHS  . polycarbophil  1,250 mg Oral QHS  . rosuvastatin  5 mg Oral Daily  . sodium chloride flush  3 mL Intravenous Q12H  . tamsulosin  0.4 mg Oral QHS  . thiamine  100 mg Oral Daily   Or  . thiamine  100 mg Intravenous Daily   Continuous Infusions: . sodium chloride Stopped (02/15/19 2321)   PRN Meds: sodium chloride, acetaminophen, albuterol, LORazepam **OR** LORazepam, Melatonin, ondansetron (ZOFRAN) IV, polyvinyl alcohol, sodium chloride flush   Vital Signs    Vitals:   02/16/19 1928 02/16/19 2026 02/17/19 0454 02/17/19 0459  BP:  (!) 121/91 114/85   Pulse: 99 (!) 102 77   Resp:      Temp:  (!) 97.5 F (36.4 C) (!) 97.5 F (36.4 C)   TempSrc:  Oral Oral   SpO2: 96% 97% 92%   Weight:    95.1 kg    Intake/Output Summary (Last 24 hours) at 02/17/2019 0618 Last data filed at 02/17/2019 0453 Gross per 24 hour  Intake 650 ml  Output 2100 ml  Net -1450 ml   Filed Weights   02/15/19 0421 02/16/19 0447 02/17/19 0459  Weight: 97.3 kg 97 kg 95.1 kg    Telemetry     NSR- Personally Reviewed  ECG    NSR with QTc 426ms - Personally Reviewed  Physical Exam   GEN: Well nourished, well developed in no acute distress HEENT: Normal NECK: No JVD; No carotid bruits LYMPHATICS: No lymphadenopathy CARDIAC:RRR, no murmurs, rubs, gallops RESPIRATORY:  Clear to auscultation  without rales, wheezing or rhonchi  ABDOMEN: Soft, non-tender, non-distended MUSCULOSKELETAL:  No edema; No deformity  SKIN: Warm and dry NEUROLOGIC:  Alert and oriented x 3 PSYCHIATRIC:  Normal affect    Labs    Chemistry Recent Labs  Lab 02/14/19 0245  02/15/19 0415 02/16/19 0444 02/17/19 0330  NA 140   < > 141 145 143  K 4.0   < > 4.0 3.4* 4.3  CL 107   < > 104 106 108  CO2 25   < > 28 28 25   GLUCOSE 127*   < > 158* 112* 111*  BUN 15   < > 27* 28* 25*  CREATININE 0.93   < > 1.19 1.07 0.82  CALCIUM 8.9   < > 9.3 8.9 8.7*  PROT 6.0*  --   --   --   --   ALBUMIN 3.6  --   --   --   --   AST 66*  --   --   --   --   ALT 64*  --   --   --   --   ALKPHOS 115  --   --   --   --  BILITOT 0.7  --   --   --   --   GFRNONAA >60   < > 58* >60 >60  GFRAA >60   < > >60 >60 >60  ANIONGAP 8   < > 9 11 10    < > = values in this interval not displayed.     Hematology Recent Labs  Lab 02/14/19 0245 02/14/19 0812 02/15/19 0415 02/16/19 0444  WBC 10.5  --  13.9* 9.8  RBC 3.50*  --  3.35* 3.37*  HGB 11.2* 11.2* 10.8* 10.7*  HCT 34.8* 33.0* 33.1* 33.5*  MCV 99.4  --  98.8 99.4  MCH 32.0  --  32.2 31.8  MCHC 32.2  --  32.6 31.9  RDW 13.2  --  13.0 13.3  PLT 148*  --  152 168    Cardiac Enzymes High Sensitivity Troponin:   Recent Labs  Lab 02/14/19 0544 02/14/19 0941 02/14/19 1103  TROPONINIHS 558* 432* 379*      BNP Recent Labs  Lab 02/14/19 0245  BNP 208.6*     Radiology    No results found.  Cardiac Studies   TTE: 02/14/2019  IMPRESSIONS  1. Left ventricular ejection fraction, by visual estimation, is 60 to 65%. The left ventricle has normal function. There is no left ventricular hypertrophy. 2. Left ventricular diastolic parameters are consistent with Grade I diastolic dysfunction (impaired relaxation). 3. Global right ventricle has normal systolic function.The right ventricular size is normal. No increase in right ventricular wall thickness.  4. Left atrial size was normal. 5. Right atrial size was normal. 6. The mitral valve is normal in structure. Mild mitral valve regurgitation. No evidence of mitral stenosis. 7. The tricuspid valve is normal in structure. Tricuspid valve regurgitation is mild. 8. The aortic valve is normal in structure. Aortic valve regurgitation is not visualized. No evidence of aortic valve sclerosis or stenosis. 9. The pulmonic valve was normal in structure. Pulmonic valve regurgitation is not visualized. 10. Mildly elevated pulmonary artery systolic pressure. 11. The inferior vena cava is normal in size with greater than 50% respiratory variability, suggesting right atrial pressure of 3 mmHg.  Cath: 01/16/2019   Ost Cx to Prox Cx lesion is 50% stenosed.  Mid LM to Dist LM lesion is 25% stenosed.  Mid LAD lesion is 100% stenosed.  LV end diastolic pressure is normal.  There is no aortic valve stenosis.   Patient Profile     78 y.o. male with a hx of atrial fibrillation, hyperlipidemia and diastolic CHF who was hospitalized for Afib ablation w/ post-proc SVT and hypotension 12/02-12/03, admitted 12/05 w/ CHF exacerbation, went into rapid Afib 12/07.  Assessment & Plan    ACUTE ON CHRONIC DIASTOLIC CHF - he put out 2.1L yesterday after IV lasix dose and is net neg 4.6L - wt down 5lbs to 209, normally 212 lbs or so - plan is for Lasix 40 mg qd x 1 week, then resume usual dose of 40 mg 3x week - f/u and BMET arranged  ATRIAL FIB/FLUTTER:  - s/p EPS/RFCA 12/2 w/ post-proc SVT/hypotension>>IVF - spontaneously converted to rapid Afib/flutter yesterday - on Eliquis and Tikosyn - now back in NRS - cancel DCCV - will discuss with EP dosing of Tikosyn - increase mag oxide to 400mg  BID since mag low on admit - send home on Kdur 32meq to take on days he takes Lasix - BMET on Friday - followup in afib clinic  CAD:  - last cath 01/2019, LAD 100% -  chest tightness yesterday likely related  to RVR and now resolved - trop elevated on admit to 588, felt 2nd demand ischemia - continue BB, statin as tolerated, no ASA due to Eliquis  Stable from cardiac standpoint for discharge home.  Tikosyn 233mcg BID, Eliquis 5mg  BID, Lasix 40mg  daily for a week and then Lasix 40mg  3 times weekly, Irbesartan 150mg  daily, Mag oxide 400mg  BID, Lopressor 25mg  BID, crestor 5mg  daily and Kdur 44meq every day he takes Lasix.  BMET on Friday.  Followup in afib clinic.     For questions or updates, please contact [facility] Please consult [email or web address] for contact info under Cardiology/STEMI.   Signed, Fransico Him, MD  02/17/2019, 6:18 AM

## 2019-02-17 NOTE — Discharge Summary (Signed)
Physician Discharge Summary  MORRELL FLUKE URK:[id number] DOB: [birth year] DOA: 02/14/2019  PCP: [name], MD  Admit date: 02/14/2019 Discharge date: 02/17/2019  Admitted From: Home Disposition: Home   Recommendations for Outpatient Follow-up:  1. Follow up with PCP 2. Follow up with BMP and magnesium 3. Follow up in AFib clinic as arranged 4. Recommend follow up with pulmonology with formal PFTs due to reported improvement with albuterol. No wheezes on exam this AM and there is concern that beta agonist may precipitate arrhythmia, so no MDI/neb prescribed at discharge.    Home Health: None Equipment/Devices: None new Discharge Condition: Stable CODE STATUS: Full Diet recommendation: Heart healthy  Brief/Interim Summary: VENSON FERENCZ is a 78 y.o. male with a history of AFib s/p EPS/RFCA 83/1 complicated by postoperative SVT and hypotension for which he received IV fluids. He presented on 12/5 with hypoxia, shortness of breath, found to have CT chest with very tiny area of questionable consolidation. He was admitted for acute HFpEF, started on lasix with cardiology consultation, as well as cefepime for possible aspiration pneumonia. He has diuresed 3.2L thus far, continuing a negative balance after switch to oral lasix as recommended by cardiology. On 12/6, ambulated 125ft without hypoxia and much improved symptoms. Plan was for discharge 12/7 when the patient reverted to symptomatic rapid AFlutter for which DCCV was scheduled. IV lasix again given with good diuresis and the patient converted to NSR without cardioversion on 12/8. Per cardiology he is stable for discharge and will need close follow up.   Discharge Diagnoses:  Principal Problem:   Acute respiratory failure ([place]) Active Problems:   CAD (coronary artery disease)   HTN (hypertension)   DTs (delirium tremens) ([place])   Lung cancer (HCC)   Paroxysmal atrial fibrillation (HCC)   H/O cardiac radiofrequency ablation    Elevated troponin  Acute hypoxic respiratory failure due to acute HFpEF: Negative 4.2L for admission, creatinine stable/improved, EDW at discharge 209.6lbs. - Continue po lasix.  - Per cardiology, Dr. Radford Pax: Stable from cardiac standpoint for discharge home. Medications recommended: Tikosyn 249mcg BID, Eliquis 5mg  BID, Lasix 40mg  daily for a week and then Lasix 40mg  3 times weekly, Irbesartan 150mg  daily, Mag oxide 400mg  BID, Lopressor 25mg  BID, crestor 5mg  daily and Kdur 57meq every day he takes Lasix.   - BMET on Friday, appt w/Scott Kathlen Mody, PA scheduled.   - Followup in afib clinic.    AFib, atypical atrial flutter with RVR: Converted spontaneously to NSR prior to scheduled DCCV this AM.  - Evaluated by EP, Dr. Caryl Comes.  - Continue medications including tikosyn and metoprolol and eliquis.   Hypokalemia:  - Supplemented and up to 4.3 at discharge. Continue supplementing on days he takes lasix. Continue empiric magnesium as well.   History of alcohol over use, AMS, concern for DTs:  - No stigmata of DTs currently  CAD with LAD occlusion: Troponin elevated as expected on admission. No current chest pain, doubt ACS.  - Continue beta blocker, anticoagulation, statin. Cardiology follow up recommended.   Physical deconditioning: Partial cause for tachycardic response to activity.  - Proceed with cardiac rehabilitation as previously scheduled.  - PT eval.  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  - Continue outpatient surveillance/treatment per oncology.   Discharge Instructions Discharge Instructions    (HEART FAILURE PATIENTS) Call MD:  Anytime you have any of the following symptoms: 1) 3 pound weight gain  in 24 hours or 5 pounds in 1 week 2) shortness of breath, with or without a dry hacking cough 3) swelling in the hands, feet or stomach 4) if you have to sleep on extra pillows at night in order  to breathe.   Complete by: As directed    Diet - low sodium heart healthy   Complete by: As directed    Discharge instructions   Complete by: As directed    Per cardiology, you are stable from cardiac standpoint for discharge home.  Medications recommended include lasix 40mg  daily x7 days, then back to three times weekly. Take potassium 62mEq (new prescription sent to pharmacy) every day that you take lasix. Increase magnesium to 400mg  TWICE daily. Continue Tikosyn 263mcg BID, Eliquis 5mg  BID, Irbesartan 150mg  daily, Lopressor 25mg  BID, crestor 5mg  daily. A follow up appointment has been scheduled for you, but contact the cardiology clinic if you experience new/worsening symptoms or seek medical attention right away if you have chest pain or trouble breathing.  Your weight at discharge is 209.6lbs.   Increase activity slowly   Complete by: As directed      Allergies as of 02/17/2019      Reactions   Glucosamine-chondroitin Anaphylaxis, Other (See Comments)   Stomach cramps, can't eat   Tetanus Toxoid Anaphylaxis, Hives   hives/throat swells shut   Fish Oil Other (See Comments)   Stomach cramps, can't eat   Ibuprofen Nausea Only, Other (See Comments)   Stomach pains   Naproxen Diarrhea      Medication List    TAKE these medications   acetaminophen 325 MG tablet Commonly known as: TYLENOL Take 650 mg by mouth every 6 (six) hours as needed for moderate pain.   apixaban 5 MG Tabs tablet Commonly known as: Eliquis Take 1 tablet (5 mg total) by mouth 2 (two) times daily.   dofetilide 250 MCG capsule Commonly known as: TIKOSYN TAKE 1 CAPSULE BY MOUTH TWO TIMES DAILY What changed: See the new instructions.   ferrous sulfate 325 (65 FE) MG tablet Commonly known as: FeroSul Take 1 tablet (325 mg total) by mouth 2 (two) times daily with a meal.   fluticasone 50 MCG/ACT nasal spray Commonly known as: FLONASE Place 1-2 sprays into both nostrils daily as needed for allergies or  rhinitis.   furosemide 40 MG tablet Commonly known as: LASIX Take 1 tablet (40 mg total) by mouth daily for 7 days, THEN 1 tablet (40 mg total) 3 (three) times a week for 23 days. Start taking on: February 17, 2019 What changed: See the new instructions.   irbesartan 150 MG tablet Commonly known as: AVAPRO TAKE 1 TABLET(150 MG) BY MOUTH DAILY What changed: See the new instructions.   magnesium oxide 400 MG tablet Commonly known as: MAG-OX Take 1 tablet (400 mg total) by mouth 2 (two) times daily. What changed: when to take this   Melatonin 10 MG Tabs Take 10 mg by mouth at bedtime as needed (sleep).   metoprolol tartrate 25 MG tablet Commonly known as: LOPRESSOR TAKE 1 TABLET BY MOUTH 2  TIMES DAILY.   metoprolol tartrate 25 MG tablet Commonly known as: LOPRESSOR Take 1-2 tablets (25-50 mg total) by mouth daily as needed (for breakthrough palpitations).   pantoprazole 40 MG tablet Commonly known as: PROTONIX Take 40 mg by mouth daily.   polycarbophil 625 MG tablet Commonly known as: FIBERCON Take 1,250 mg by mouth at bedtime.   Potassium Chloride ER 20 MEQ Tbcr take  1 tab by mouth every day that you take lasix. What changed:   medication strength  See the new instructions.   PRESERVISION AREDS 2 PO Take 1 capsule by mouth 2 (two) times daily.   rosuvastatin 5 MG tablet Commonly known as: CRESTOR TAKE 1 TABLET BY MOUTH  EVERY MORNING What changed: when to take this   SYSTANE OP Place 2 drops into both eyes 2 (two) times daily as needed (dry eyes).   tamsulosin 0.4 MG Caps capsule Commonly known as: FLOMAX Take 0.4 mg by mouth at bedtime.   Vitamin D3 125 MCG (5000 UT) Tabs Take 5,000 Units by mouth daily.      Follow-up Information    Richardson Dopp T, PA-C. Go on 02/24/2019.   Specialties: Cardiology, Physician Assistant Why: @12 :15pm for hosptial follow up with Dr. Tanna Furry PA. please arrive 15 minutes early. Lab work that day, do not have to be  fasting, morning meds ok. Contact information: [address] [place] [address] [phone]        [name], MD. Schedule an appointment as soon as possible for a visit in 1 week(s).   Specialty: Family Medicine Contact information: [place] [address] [phone]          Allergies  Allergen Reactions  . Glucosamine-Chondroitin Anaphylaxis and Other (See Comments)    Stomach cramps, can't eat   . Tetanus Toxoid Anaphylaxis and Hives    hives/throat swells shut   . Fish Oil Other (See Comments)    Stomach cramps, can't eat   . Ibuprofen Nausea Only and Other (See Comments)    Stomach pains   . Naproxen Diarrhea    Consultations:  Cardiology  Procedures/Studies: Dg Neck Soft Tissue  Result Date: 02/14/2019 CLINICAL DATA:  Initial evaluation for acute stridor, shortness of breath. EXAM: NECK SOFT TISSUES - 1+ VIEW COMPARISON:  None. FINDINGS: Epiglottis within normal limits. Question mild retropharyngeal/prevertebral soft tissue fullness at the level of C5-6, measuring up to approximately 2 cm. No retropharyngeal gas. Small linear radiopaque density seen just anterior to the C3 vertebral body felt to be related to thyroid or cricoid calcification. No radiopaque foreign body. Advanced multilevel degenerative spondylosis, most notable at C3-4. Remainder of the visualized soft tissues of the neck demonstrate no acute finding. Probable vascular calcifications overlie the region of both carotid bifurcations. Scattered peribronchial thickening seen within the visualized upper lungs. Probable superimposed right upper lobe atelectatic changes. IMPRESSION: 1. Question mild retropharyngeal soft tissue fullness at the level of C5-6, which could reflect edema and/or effusion. No frank retropharyngeal gas. Normal epiglottis. 2. Aortic Atherosclerosis (ICD10-I70.0). 3. Moderate to advanced multilevel cervical spondylosis, most notable at  C3-4. Results discussed by telephone at the time of interpretation on 02/14/2019 at 3 STIR:41 am to [place]. Electronically Signed   By: Jeannine Boga M.D.   On: 02/14/2019 03:42   Ct Soft Tissue Neck W Contrast  Result Date: 02/14/2019 CLINICAL DATA:  Initial evaluation for acute sore throat, stridor. EXAM: CT NECK WITH CONTRAST TECHNIQUE: Multidetector CT imaging of the neck was performed using the standard protocol following the bolus administration of intravenous contrast. CONTRAST:  164mL OMNIPAQUE IOHEXOL 350 MG/ML SOLN COMPARISON:  Prior radiograph from earlier same day. FINDINGS: Pharynx and larynx: Oral cavity within normal limits without discrete mass or collection. No acute abnormality about the dentition. Palatine tonsils symmetric and within normal limits. Few punctate calcified tonsilliths noted on the right. Parapharyngeal fat maintained. Nasopharynx and  the remainder of the oropharynx within normal limits. Epiglottis normal. Vallecula clear. Remainder of the hypopharynx and supraglottic larynx within normal limits. No retropharyngeal collection, effusion, or significant soft tissue swelling. Previously question prevertebral/retropharyngeal fullness likely related to positioning and/or summation of shadows on prior radiograph. True cords symmetric and normal. Subglottic airway widely patent and clear. Salivary glands: Salivary glands including the parotid and submandibular glands within normal limits. Thyroid: Thyroid within normal limits. No discrete thyroid nodule or mass. Lymph nodes: No pathologically enlarged lymph nodes identified within the neck. Vascular: Moderate atherosclerotic change present about the aortic arch, carotid bifurcations, and carotid siphons. Normal intravascular enhancement seen throughout the neck. Limited intracranial: Age-related cerebral atrophy. Otherwise unremarkable. Visualized orbits: Globes and orbital soft tissues within normal limits.  Mastoids and visualized paranasal sinuses: Chronic mucosal thickening noted within the ethmoidal air cells, sphenoid sinuses, and maxillary sinuses. Superimposed air-fluid levels noted within the sphenoid sinuses, suggesting acute on chronic disease. Mastoid air cells and middle ear cavities well pneumatized and free of fluid. Skeleton: No acute osseous abnormality. No discrete lytic or blastic osseous lesions. Moderate multilevel cervical spondylosis, most notable at C3-4 and C5-6. Upper chest: Small layering bilateral pleural effusions, right greater than left, partially visualized. Associated scattered atelectatic changes seen within the visualized lungs. Partially visualized upper chest otherwise demonstrates no acute finding. Other: None. IMPRESSION: 1. Negative CT of the neck. No discrete mass or collection identified. Previously questioned prevertebral/retropharyngeal fullness likely related to positioning and/or summation of shadows on prior radiograph. 2. Small layering bilateral pleural effusions, right greater than left, partially visualized. 3. Chronic pan sinusitis with superimposed air-fluid levels within the sphenoid sinuses, suggesting acute on chronic disease. 4.  Aortic Atherosclerosis (ICD10-I70.0). Electronically Signed   By: Jeannine Boga M.D.   On: 02/14/2019 05:22   Ct Angio Chest Pe W And/or Wo Contrast  Result Date: 02/14/2019 CLINICAL DATA:  Short of breath, recent cardiac ablation for supraventricular tachycardia. EXAM: CT ANGIOGRAPHY CHEST WITH CONTRAST TECHNIQUE: Multidetector CT imaging of the chest was performed using the standard protocol during bolus administration of intravenous contrast. Multiplanar CT image reconstructions and MIPs were obtained to evaluate the vascular anatomy. CONTRAST:  119mL OMNIPAQUE IOHEXOL 350 MG/ML SOLN COMPARISON:  Chest CT 01/02/2019 FINDINGS: Cardiovascular: No filling defects within the pulmonary arteries to suggest acute pulmonary embolism.  No acute findings of the aorta or great vessels. No pericardial fluid. Coronary artery calcification and aortic atherosclerotic calcification. Mediastinum/Nodes: No axillary supraclavicular adenopathy. Mild RIGHT hilar adenopathy with a 14 mm hilar lymph node. Lungs/Pleura: New small RIGHT pleural effusion. Small focus of consolidation at the RIGHT lung base measuring 2.5 cm (image 94/6). There is mild interlobular septal thickening in the lungs. Mild ground-glass pattern lungs additionally. Upper Abdomen: Limited view of the liver, kidneys, pancreas are unremarkable. Normal adrenal glands. Nonobstructing calculus in the upper pole of the LEFT kidney measuring 3 mm. Musculoskeletal: No aggressive osseous lesion Review of the MIP images confirms the above findings. IMPRESSION: 1. No evidence acute pulmonary embolism. 2. New small RIGHT pleural effusion.  Mild pulmonary edema pattern. 3. Small focus of consolidation at the RIGHT lung base is favored atelectasis. Cannot exclude small focus of infection. Infarction unlikely. 4. Mild RIGHT hilar adenopathy is likely reactive. 5. Aortic Atherosclerosis (ICD10-I70.0). Electronically Signed   By: Suzy Bouchard M.D.   On: 02/14/2019 05:08   Dg Chest Portable 1 View  Result Date: 02/14/2019 CLINICAL DATA:  Initial evaluation for acute shortness of breath. EXAM: PORTABLE CHEST 1 VIEW  COMPARISON:  Prior radiograph from 07/24/2018. FINDINGS: Mild cardiomegaly, stable. Mediastinal silhouette within normal limits. Aortic atherosclerosis. Lungs hypoinflated with elevation of the right hemidiaphragm, similar to previous. Suture line overlies the mid-lower right lung. Interval increase in prominence of the interstitial and peribronchial markings, which could reflect acute bronchiolitis or possibly mild interstitial congestion/edema. Blunting of the right costophrenic angle could reflect small effusion versus chronic pleural reaction/scarring, similar to previous. Irregular  scarring and/or atelectatic changes present within the mid and lower right lung as well. No other consolidative airspace disease. No pneumothorax. No acute osseous finding. Bilateral shoulder arthroplasties noted. IMPRESSION: 1. Interval increase in prominence of the interstitial and peribronchial markings, which could reflect acute bronchiolitis or possibly mild interstitial congestion/edema. 2. Blunting of the right costophrenic angle, which could reflect a small right pleural effusion versus chronic pleural reaction/scarring. 3. Additional scarring and/or atelectatic changes within the mid and lower right lung, stable. 4. Stable cardiomegaly with aortic atherosclerosis. Electronically Signed   By: Jeannine Boga M.D.   On: 02/14/2019 03:45     Subjective: Feels well, hungry and looking forward to breakfast now that he won't have cardioversion. No chest pain or dyspnea. No leg swelling. Had >2L UOP in response to 40mg  IV lasix yesterday.   Discharge Exam: Vitals:   02/16/19 2026 02/17/19 0454  BP: (!) 121/91 114/85  Pulse: (!) 102 77  Resp:    Temp: (!) 97.5 F (36.4 C) (!) 97.5 F (36.4 C)  SpO2: 97% 92%   General: Pt is alert, awake, not in acute distress Cardiovascular: RRR, S1/S2 +, no rubs, no gallops Respiratory: CTA bilaterally, no wheezing, no rhonchi Abdominal: Soft, NT, ND, bowel sounds + Extremities: No edema, no cyanosis  Labs: BNP (last 3 results) Recent Labs    02/14/19 0245  BNP 944.9*   Basic Metabolic Panel: Recent Labs  Lab 02/12/19 0731 02/14/19 0245 02/14/19 0812 02/14/19 1300 02/15/19 0415 02/16/19 0444 02/17/19 0330  NA 139 140 138 138 141 145 143  K 4.0 4.0 4.2 4.0 4.0 3.4* 4.3  CL 107 107  --  105 104 106 108  CO2 22 25  --  24 28 28 25   GLUCOSE 135* 127*  --  247* 158* 112* 111*  BUN 16 15  --  15 27* 28* 25*  CREATININE 1.03 0.93  --  0.92 1.19 1.07 0.82  CALCIUM 8.4* 8.9  --  8.8* 9.3 8.9 8.7*  MG 1.8  --   --   --   --   --   --     Liver Function Tests: Recent Labs  Lab 02/14/19 0245  AST 66*  ALT 64*  ALKPHOS 115  BILITOT 0.7  PROT 6.0*  ALBUMIN 3.6   No results for input(s): LIPASE, AMYLASE in the last 168 hours. No results for input(s): AMMONIA in the last 168 hours. CBC: Recent Labs  Lab 02/14/19 0245 02/14/19 0812 02/15/19 0415 02/16/19 0444  WBC 10.5  --  13.9* 9.8  NEUTROABS 7.6  --  11.3*  --   HGB 11.2* 11.2* 10.8* 10.7*  HCT 34.8* 33.0* 33.1* 33.5*  MCV 99.4  --  98.8 99.4  PLT 148*  --  152 168   Cardiac Enzymes: No results for input(s): CKTOTAL, CKMB, CKMBINDEX, TROPONINI in the last 168 hours. BNP: Invalid input(s): POCBNP CBG: No results for input(s): GLUCAP in the last 168 hours. D-Dimer No results for input(s): DDIMER in the last 72 hours. Hgb A1c No results for input(s): HGBA1C  in the last 72 hours. Lipid Profile No results for input(s): CHOL, HDL, LDLCALC, TRIG, CHOLHDL, LDLDIRECT in the last 72 hours. Thyroid function studies No results for input(s): TSH, T4TOTAL, T3FREE, THYROIDAB in the last 72 hours.  Invalid input(s): FREET3 Anemia work up No results for input(s): VITAMINB12, FOLATE, FERRITIN, TIBC, IRON, RETICCTPCT in the last 72 hours. Urinalysis    Component Value Date/Time   COLORURINE YELLOW 02/14/2019 0430   APPEARANCEUR CLEAR 02/14/2019 0430   LABSPEC 1.020 02/14/2019 0430   PHURINE 5.0 02/14/2019 0430   GLUCOSEU NEGATIVE 02/14/2019 0430   HGBUR NEGATIVE 02/14/2019 0430   BILIRUBINUR NEGATIVE 02/14/2019 0430   KETONESUR NEGATIVE 02/14/2019 0430   PROTEINUR NEGATIVE 02/14/2019 0430   UROBILINOGEN 0.2 02/05/2014 1628   NITRITE NEGATIVE 02/14/2019 0430   LEUKOCYTESUR NEGATIVE 02/14/2019 0430    Microbiology Recent Results (from the past 240 hour(s))  Novel Coronavirus, NAA (Hosp order, Send-out to Ref Lab; TAT 18-24 hrs     Status: None   Collection Time: 02/07/19  4:44 PM   Specimen: Nasopharyngeal Swab; Respiratory  Result Value Ref [place] Status    SARS-CoV-2, NAA NOT DETECTED NOT DETECTED Final    Comment: (NOTE) This nucleic acid amplification test was developed and its performance characteristics determined by [employer]. Nucleic acid amplification tests include PCR and TMA. This test has not been FDA cleared or approved. This test has been authorized by FDA under an Emergency Use Authorization (EUA). This test is only authorized for the duration of time the declaration that circumstances exist justifying the authorization of the emergency use of in vitro diagnostic tests for detection of SARS-CoV-2 virus and/or diagnosis of COVID-19 infection under section 564(b)(1) of the Act, 21 U.S.C. 403KVQ-2(V) (1), unless the authorization is terminated or revoked sooner. When diagnostic testing is negative, the possibility of a false negative result should be considered in the context of a patient's recent exposures and the presence of clinical signs and symptoms consistent with COVID-19. An individual without symptoms of COVID- 19 and who is not shedding SARS-CoV-2 vi rus would expect to have a negative (not detected) result in this assay. Performed At: [facility] [address] [place], [place] [id number] Rush Farmer MD PP:[id number]    [place]  Final    Comment: Performed at [facility], [place] [address] [place], [place] [address]  Urine culture     Status: None   Collection Time: 02/14/19  2:49 AM   Specimen: In/Out Cath Urine  Result Value Ref Range Status   Specimen Description IN/OUT CATH URINE  Final   Special Requests NONE  Final   Culture   Final    NO GROWTH Performed at [facility], [place] [address] [place], [place] [address]    Report Status 02/15/2019 FINAL  Final  Blood Culture (routine x 2)     Status: None (Preliminary result)   Collection Time: 02/14/19  2:50 AM   Specimen: BLOOD  Result Value Ref [place] Status   Specimen Description BLOOD LEFT  HAND  Final   Special Requests   Final    BOTTLES DRAWN AEROBIC AND ANAEROBIC Blood Culture results may not be optimal due to an inadequate volume of blood received in culture bottles   Culture   Final    NO GROWTH 3 DAYS Performed at [facility], [place] [name] [place], [place] [address]    Report Status PENDING  Incomplete  Blood Culture (routine x 2)  Status: None (Preliminary result)   Collection Time: 02/14/19  2:50 AM   Specimen: BLOOD  Result Value Ref [place] Status   Specimen Description BLOOD RIGHT HAND  Final   Special Requests   Final    BOTTLES DRAWN AEROBIC AND ANAEROBIC Blood Culture results may not be optimal due to an inadequate volume of blood received in culture bottles   Culture   Final    NO GROWTH 3 DAYS Performed at [facility], [place] [address], [place], [place] [address]    Report Status PENDING  Incomplete  SARS CORONAVIRUS 2 (TAT 6-24 HRS) Nasopharyngeal Nasopharyngeal Swab     Status: None   Collection Time: 02/14/19  3:36 AM   Specimen: Nasopharyngeal Swab  Result Value Ref [place] Status   SARS Coronavirus 2 NEGATIVE NEGATIVE Final    Comment: (NOTE) SARS-CoV-2 target nucleic acids are NOT DETECTED. The SARS-CoV-2 RNA is generally detectable in upper and lower respiratory specimens during the acute phase of infection. Negative results do not preclude SARS-CoV-2 infection, do not rule out co-infections with other pathogens, and should not be used as the sole basis for treatment or other patient management decisions. Negative results must be combined with clinical observations, patient history, and epidemiological information. The expected result is Negative. Fact Sheet for Patients: SugarRoll.be Fact Sheet for Healthcare Providers: https://www.woods-mathews.com/ This test is not yet approved or cleared by the [place] FDA and  has been authorized for detection and/or diagnosis of SARS-CoV-2  by FDA under an Emergency Use Authorization (EUA). This EUA will remain  in effect (meaning this test can be used) for the duration of the COVID-19 declaration under Section 56 4(b)(1) of the Act, 21 U.S.C. section 360bbb-3(b)(1), unless the authorization is terminated or revoked sooner. Performed at [facility], [place] [address] [place],  [address]     Time coordinating discharge: Approximately 40 minutes  [name], MD  [facility] 02/17/2019, 9:24 AM

## 2019-02-17 NOTE — Care Management Important Message (Signed)
Important Message  Patient Details  Name: George Vega MRN: [id number] Date of Birth: [birth year]   Medicare Important Message Given:  Yes     Shelda Altes 02/17/2019, 1:05 PM

## 2019-02-18 ENCOUNTER — Encounter: Payer: Self-pay | Admitting: *Deleted

## 2019-02-19 ENCOUNTER — Encounter (INDEPENDENT_AMBULATORY_CARE_PROVIDER_SITE_OTHER): Payer: Medicare Other | Admitting: Ophthalmology

## 2019-02-19 LAB — CULTURE, BLOOD (ROUTINE X 2)
Culture: NO GROWTH
Culture: NO GROWTH

## 2019-02-23 ENCOUNTER — Other Ambulatory Visit: Payer: Self-pay | Admitting: Internal Medicine

## 2019-02-23 NOTE — Progress Notes (Signed)
Cardiology Office Note:    Date:  02/24/2019   ID:  George Vega, DOB [birth year], MRN [id number]  PCP:  [name], MD  Cardiologist:  Cristopher Peru, MD   Electrophysiologist:  Constance Haw, MD   Referring MD: [name], MD   Chief Complaint  Patient presents with  . Hospitalization Follow-up    CHF, AFib     History of Present Illness:    George Vega is a 78 y.o. 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 Curt Bears on 02/11/2019.  Post op, he developed SVT with assoc hypotension.  He converted to normal sinus rhythm and was DC the next day.  He was then admitted 12/5-12/8 with acute diastolic CHF c/b AFib/Flutter w/ RVR.  He was diuresed and converted to normal sinus rhythm on his own.  He did have elevated hs-Trop levels felt to be c/w demand ischemia.  Echocardiogram demonstrated normal EF and mild diastolic dysfunction.   He returns for follow-up.  He has felt well since discharge from the hospital.  His weights have been stable.  His breathing is improved.  He has not had orthopnea, PND, leg swelling.  He has not had chest pain.  He has not had syncope or near syncope.    Prior CV studies:   The following studies were reviewed today:  Echocardiogram 02/14/2019 EF 60-65, Gr 1 DD, normal RVSF, mild MR, mild TR, RVSP 39.6  Cardiac catheterization 01/16/2019 LM mid 25 LAD mid 100 (R-L collats) LCx ost 50    Carotid US 10/31/2018 Summary: Right Carotid: Velocities in the right ICA are  consistent with a 1-39% stenosis. Left Carotid: Velocities in the left ICA are consistent with a 1-39% stenosis.  Myoview 01/14/17 Small size, moderate intensity fixed apical/apical septal perfusion defect, suggestive of apical thinning or artifact. No reversible ischemia. LVEF 56% with normal wall motion. This is a low risk study.   Past Medical History:  Diagnosis Date  . Arthritis   . Atrial fibrillation, persistent ([place])   . Chronic sinusitis   . Coronary artery disease   . Depression   . Difficult intubation    was told with shoulder done 2006-alittle narrow  . 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 Hypertension   . Jejunostomy tube fell out    when asked about this in 04/2017, pt denied ever having a J tube, feeding tube, tubes placed post surgery so ??? veracity of a previous J tube.    . 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 Spastic colon   . Squamous cell carcinoma of lung, stage I, right ([place]) 05/07/2018   bx 04/29/18; isolated PET uptake in RLL mass  . Stroke (cerebrum) ([place])   . SVT (supraventricular tachycardia) (HCC)    Surgical Hx: The patient  has a past surgical history that includes Uvulopalatopharyngoplasty; Nasal sinus surgery; Shoulder arthroscopy (03/01/2011); Foot neuroma surgery (2002); Shoulder arthroscopy (03/01/2011); Total shoulder arthroplasty (  06/28/2011); Total knee arthroplasty (Right, 09/23/2013); left heart catheterization with coronary angiogram (Right, 06/14/2011); Esophageal dilation (2017); TEE without cardioversion (N/A, 03/19/2016); Esophagogastroduodenoscopy (egd) with propofol (N/A, 04/17/2017); Colonoscopy with propofol (N/A, 04/17/2017); Cardiac catheterization; Cataract extraction, bilateral; Eye surgery; Video assisted thoracoscopy (vats)/wedge resection (Right, 06/02/2018); LEFT HEART CATH AND CORONARY ANGIOGRAPHY (N/A, 01/16/2019); and ATRIAL FIBRILLATION ABLATION (N/A, 02/11/2019).   Current Medications: Current Meds  Medication Sig  .  acetaminophen (TYLENOL) 325 MG tablet Take 650 mg by mouth every 6 (six) hours as needed for moderate pain.  Marland Kitchen apixaban (ELIQUIS) 5 MG TABS tablet Take 1 tablet (5 mg total) by mouth 2 (two) times daily.  . Cholecalciferol (VITAMIN D3) 5000 units TABS Take 5,000 Units by mouth daily.  Marland Kitchen dofetilide (TIKOSYN) 250 MCG capsule TAKE 1 CAPSULE BY MOUTH TWO TIMES DAILY  . ferrous sulfate (FEROSUL) 325 (65 FE) MG tablet Take 1 tablet (325 mg total) by mouth 2 (two) times daily with a meal.  . fluticasone (FLONASE) 50 MCG/ACT nasal spray Place 1-2 sprays into both nostrils daily as needed for allergies or rhinitis.  . furosemide (LASIX) 40 MG tablet Take 1 tablet (40 mg total) by mouth daily for 7 days, THEN 1 tablet (40 mg total) 3 (three) times a week for 23 days.  . irbesartan (AVAPRO) 150 MG tablet TAKE 1 TABLET(150 MG) BY MOUTH DAILY  . magnesium oxide (MAG-OX) 400 MG tablet Take 1 tablet (400 mg total) by mouth 2 (two) times daily.  . Melatonin 10 MG TABS Take 10 mg by mouth at bedtime as needed (sleep).   . metoprolol tartrate (LOPRESSOR) 25 MG tablet TAKE 1 TABLET BY MOUTH 2  TIMES DAILY.  . Multiple Vitamins-Minerals (PRESERVISION AREDS 2 PO) Take 1 capsule by mouth 2 (two) times daily.  . pantoprazole (PROTONIX) 40 MG tablet Take 40 mg by mouth daily.   Marland Kitchen PARoxetine (PAXIL) 40 MG tablet Take 40 mg by mouth every morning.  . polycarbophil (FIBERCON) 625 MG tablet Take 1,250 mg by mouth at bedtime.   [name] Glycol-Propyl Glycol (SYSTANE OP) Place 2 drops into both eyes 2 (two) times daily as needed (dry eyes).   . potassium chloride 20 MEQ TBCR take 1 tab by mouth every day that you take lasix.  Marland Kitchen rosuvastatin (CRESTOR) 5 MG tablet TAKE 1 TABLET BY MOUTH  EVERY MORNING  . tamsulosin (FLOMAX) 0.4 MG CAPS capsule Take 0.4 mg by mouth at bedtime.      Allergies:   Glucosamine-chondroitin, Tetanus toxoid, Fish oil, Ibuprofen, and Naproxen   Social History   Tobacco Use  . Smoking status:  Former Smoker    Packs/day: 2.00    Years: 30.00    Pack years: 60.00    Types: Cigarettes    Quit date: 02/27/1998    Years since quitting: 21.0  . Smokeless tobacco: Never Used  Substance Use Topics  . Alcohol use: Yes    Alcohol/week: 14.0 standard drinks    Types: 14 Standard drinks or equivalent per week    Comment: 2 drinks daily (bourbon)  . Drug use: No     Family Hx: The patient's family history includes Cancer in his mother; Heart attack in his father; Migraines in his brother.  ROS:   Please see the history of present illness.    ROS All other systems reviewed and are negative.   EKGs/Labs/Other Test Reviewed:    EKG:  EKG is  ordered today.  The ekg ordered today demonstrates normal sinus rhythm, HR  82, PACs, normal axis, QTC 486, no change from prior tracing  Recent Labs: 02/14/2019: ALT 64; B Natriuretic Peptide 208.6 02/16/2019: Hemoglobin 10.7; Platelets 168 02/17/2019: BUN 25; Creatinine, Ser 0.82; Magnesium 2.1; Potassium 4.3; Sodium 143   Recent Lipid Panel No results found for: CHOL, TRIG, HDL, CHOLHDL, LDLCALC, LDLDIRECT     Physical Exam:    VS:  BP 112/62   Pulse 79   Ht 5\' 10"  (1.778 m)   Wt 210 lb 12.8 oz (95.6 kg)   SpO2 95%   BMI 30.25 kg/m     Wt Readings from Last 3 Encounters:  02/24/19 210 lb 12.8 oz (95.6 kg)  02/17/19 209 lb 9.6 oz (95.1 kg)  02/12/19 219 lb 12.8 oz (99.7 kg)     Physical Exam  Constitutional: He is oriented to person, place, and time. He appears well-developed and well-nourished. No distress.  HENT:  Head: Normocephalic and atraumatic.  Eyes: No scleral icterus.  Neck: No JVD present. No thyromegaly present.  Cardiovascular: Normal rate, regular rhythm and normal heart sounds.  No murmur heard. Pulmonary/Chest: Effort normal and breath sounds normal. He has no rales.  Abdominal: Soft. There is no hepatomegaly.  Musculoskeletal:        General: No edema.  Lymphadenopathy:    He has no cervical  adenopathy.  Neurological: He is alert and oriented to person, place, and time.  Skin: Skin is warm and dry.  Psychiatric: He has a normal mood and affect.    ASSESSMENT & PLAN:    1. Chronic diastolic heart failure (HCC) Status post recent admission with acute diastolic heart failure.  He has done well since discharge.  He has chronic dyspnea with exertion related to his previous lung surgery.  Volume status appears stable.  Continue current dose of furosemide.  Obtain follow-up BMP, magnesium today.  2. Paroxysmal atrial fibrillation (HCC) Maintaining normal sinus rhythm.  He is status post left-sided PVI ablation in December 2020.  Continue Apixaban for anticoagulation.  He remains on dofetilide.  QTC is stable.  Obtain follow-up BMP, magnesium today.  3. Essential hypertension The patient's blood pressure is controlled on his current regimen.  Continue current therapy.   4. Coronary artery disease involving native coronary artery of native heart without angina pectoris He has a chronically occluded LAD.  He is not having anginal symptoms.  Recent admission was complicated by atrial fibrillation/flutter with rapid ventricular rate.  He did have elevated troponin secondary to demand ischemia.  He is not on aspirin as he is on Apixaban.  Continue rosuvastatin.  5. Hyperlipidemia, unspecified hyperlipidemia type Continue statin therapy.   Dispo:  Return for Scheduled Follow Up w/ AFib Clinic and Dr. Curt Bears.   Medication Adjustments/Labs and Tests Ordered: Current medicines are reviewed at length with the patient today.  Concerns regarding medicines are outlined above.  Tests Ordered: Orders Placed This Encounter  Procedures  . Magnesium  . EKG 12-Lead   Medication Changes: No orders of the defined types were placed in this encounter.   Signed, Richardson Dopp, PA-C  02/24/2019 12:42 PM    [facility] [address], [place], [place]  [address] Phone: [phone]; Fax: [phone]

## 2019-02-24 ENCOUNTER — Other Ambulatory Visit: Payer: Medicare Other

## 2019-02-24 ENCOUNTER — Encounter: Payer: Self-pay | Admitting: Physician Assistant

## 2019-02-24 ENCOUNTER — Other Ambulatory Visit: Payer: Self-pay

## 2019-02-24 ENCOUNTER — Ambulatory Visit (INDEPENDENT_AMBULATORY_CARE_PROVIDER_SITE_OTHER): Payer: Medicare Other | Admitting: Physician Assistant

## 2019-02-24 VITALS — BP 112/62 | HR 79 | Ht 70.0 in | Wt 210.8 lb

## 2019-02-24 DIAGNOSIS — I2510 Atherosclerotic heart disease of native coronary artery without angina pectoris: Secondary | ICD-10-CM | POA: Diagnosis not present

## 2019-02-24 DIAGNOSIS — I480 Paroxysmal atrial fibrillation: Secondary | ICD-10-CM

## 2019-02-24 DIAGNOSIS — E785 Hyperlipidemia, unspecified: Secondary | ICD-10-CM

## 2019-02-24 DIAGNOSIS — I5032 Chronic diastolic (congestive) heart failure: Secondary | ICD-10-CM | POA: Diagnosis not present

## 2019-02-24 DIAGNOSIS — I5033 Acute on chronic diastolic (congestive) heart failure: Secondary | ICD-10-CM

## 2019-02-24 DIAGNOSIS — I10 Essential (primary) hypertension: Secondary | ICD-10-CM

## 2019-02-24 NOTE — Patient Instructions (Addendum)
  Medication Instructions:   Your physician recommends that you continue on your current medications as directed. Please refer to the Current Medication list given to you today.  *If you need a refill on your cardiac medications before your next appointment, please call your pharmacy*  Lab Work:  You will have labs drawn today: BMET/Magnesium  If you have labs (blood work) drawn today and your tests are completely normal, you will receive your results only by: Marland Kitchen MyChart Message (if you have MyChart) OR . A paper copy in the mail If you have any lab test that is abnormal or we need to change your treatment, we will call you to review the results.  Testing/Procedures:  None ordered today  Follow-Up:  As scheduled with the A-fib clinic and Dr. Allegra Lai

## 2019-02-25 LAB — BASIC METABOLIC PANEL
BUN/Creatinine Ratio: 17 (ref 10–24)
BUN: 18 mg/dL (ref 8–27)
CO2: 25 mmol/L (ref 20–29)
Calcium: 9.6 mg/dL (ref 8.6–10.2)
Chloride: 100 mmol/L (ref 96–106)
Creatinine, Ser: 1.05 mg/dL (ref 0.76–1.27)
GFR calc Af Amer: 78 mL/min/{1.73_m2} (ref >59)
GFR calc non Af Amer: 68 mL/min/{1.73_m2} (ref >59)
Glucose: 148 mg/dL — ABNORMAL HIGH (ref 65–99)
Potassium: 4.4 mmol/L (ref 3.5–5.2)
Sodium: 139 mmol/L (ref 134–144)

## 2019-02-25 LAB — MAGNESIUM: Magnesium: 2.1 mg/dL (ref 1.6–2.3)

## 2019-02-26 ENCOUNTER — Encounter (INDEPENDENT_AMBULATORY_CARE_PROVIDER_SITE_OTHER): Payer: Medicare Other | Admitting: Ophthalmology

## 2019-02-26 ENCOUNTER — Other Ambulatory Visit: Payer: Self-pay

## 2019-02-26 DIAGNOSIS — H353211 Exudative age-related macular degeneration, right eye, with active choroidal neovascularization: Secondary | ICD-10-CM | POA: Diagnosis not present

## 2019-02-26 DIAGNOSIS — H353122 Nonexudative age-related macular degeneration, left eye, intermediate dry stage: Secondary | ICD-10-CM | POA: Diagnosis not present

## 2019-02-26 DIAGNOSIS — H35033 Hypertensive retinopathy, bilateral: Secondary | ICD-10-CM | POA: Diagnosis not present

## 2019-02-26 DIAGNOSIS — D3131 Benign neoplasm of right choroid: Secondary | ICD-10-CM

## 2019-02-26 DIAGNOSIS — I10 Essential (primary) hypertension: Secondary | ICD-10-CM

## 2019-02-26 DIAGNOSIS — H43813 Vitreous degeneration, bilateral: Secondary | ICD-10-CM

## 2019-03-11 ENCOUNTER — Other Ambulatory Visit: Payer: Self-pay

## 2019-03-11 ENCOUNTER — Ambulatory Visit (HOSPITAL_COMMUNITY)
Admission: RE | Admit: 2019-03-11 | Discharge: 2019-03-11 | Disposition: A | Discharge status: Home / Self Care | Payer: Medicare Other | Source: Ambulatory Visit | Attending: Nurse Practitioner | Admitting: Nurse Practitioner

## 2019-03-11 ENCOUNTER — Encounter (HOSPITAL_COMMUNITY): Payer: Self-pay | Admitting: Nurse Practitioner

## 2019-03-11 VITALS — BP 104/64 | HR 76 | Ht 70.0 in | Wt 212.8 lb

## 2019-03-11 DIAGNOSIS — I471 Supraventricular tachycardia: Secondary | ICD-10-CM | POA: Diagnosis not present

## 2019-03-11 DIAGNOSIS — I10 Essential (primary) hypertension: Secondary | ICD-10-CM | POA: Insufficient documentation

## 2019-03-11 DIAGNOSIS — Z7901 Long term (current) use of anticoagulants: Secondary | ICD-10-CM | POA: Diagnosis not present

## 2019-03-11 DIAGNOSIS — D6869 Other thrombophilia: Secondary | ICD-10-CM

## 2019-03-11 DIAGNOSIS — Z96611 Presence of right artificial shoulder joint: Secondary | ICD-10-CM | POA: Diagnosis not present

## 2019-03-11 DIAGNOSIS — Z886 Allergy status to analgesic agent status: Secondary | ICD-10-CM | POA: Diagnosis not present

## 2019-03-11 DIAGNOSIS — M1990 Unspecified osteoarthritis, unspecified site: Secondary | ICD-10-CM | POA: Diagnosis not present

## 2019-03-11 DIAGNOSIS — Z8673 Personal history of transient ischemic attack (TIA), and cerebral infarction without residual deficits: Secondary | ICD-10-CM | POA: Insufficient documentation

## 2019-03-11 DIAGNOSIS — Z888 Allergy status to other drugs, medicaments and biological substances status: Secondary | ICD-10-CM | POA: Diagnosis not present

## 2019-03-11 DIAGNOSIS — I2510 Atherosclerotic heart disease of native coronary artery without angina pectoris: Secondary | ICD-10-CM | POA: Diagnosis not present

## 2019-03-11 DIAGNOSIS — F329 Major depressive disorder, single episode, unspecified: Secondary | ICD-10-CM | POA: Insufficient documentation

## 2019-03-11 DIAGNOSIS — R9431 Abnormal electrocardiogram [ECG] [EKG]: Secondary | ICD-10-CM | POA: Diagnosis not present

## 2019-03-11 DIAGNOSIS — Z87891 Personal history of nicotine dependence: Secondary | ICD-10-CM | POA: Diagnosis not present

## 2019-03-11 DIAGNOSIS — Z96651 Presence of right artificial knee joint: Secondary | ICD-10-CM | POA: Insufficient documentation

## 2019-03-11 DIAGNOSIS — I480 Paroxysmal atrial fibrillation: Secondary | ICD-10-CM

## 2019-03-11 DIAGNOSIS — Z79899 Other long term (current) drug therapy: Secondary | ICD-10-CM | POA: Insufficient documentation

## 2019-03-11 DIAGNOSIS — D0220 Carcinoma in situ of unspecified bronchus and lung: Secondary | ICD-10-CM | POA: Diagnosis not present

## 2019-03-11 DIAGNOSIS — I4819 Other persistent atrial fibrillation: Secondary | ICD-10-CM | POA: Insufficient documentation

## 2019-03-11 DIAGNOSIS — Z8249 Family history of ischemic heart disease and other diseases of the circulatory system: Secondary | ICD-10-CM | POA: Diagnosis not present

## 2019-03-11 DIAGNOSIS — Z887 Allergy status to serum and vaccine status: Secondary | ICD-10-CM | POA: Insufficient documentation

## 2019-03-11 DIAGNOSIS — Z800 Family history of malignant neoplasm of digestive organs: Secondary | ICD-10-CM | POA: Diagnosis not present

## 2019-03-11 NOTE — Progress Notes (Signed)
Primary Care Physician: [name], MD Referring Physician: Dr. Georgiana Shore George Vega is a 78 y.o. male with a h/o afib that is s/p ablation x one month, on 12/2. He did have SVT and hypotension and extra fluids. It did break before d/c.  He had not noted any afib except for an episode that lasted one hour yesterday. No swallowing or groin issues. Is on eliquis 5 mg bid of a CHA2DS2VASc score of at least 6. He  was admitted 12/5 with hypoxia and shortness of breath and was diuresed.   Today, he denies symptoms of palpitations, chest pain, shortness of breath, orthopnea, PND, lower extremity edema, dizziness, presyncope, syncope, or neurologic sequela. The patient is tolerating medications without difficulties and is otherwise without complaint today.   Past Medical History:  Diagnosis Date  . Arthritis   . Atrial fibrillation, persistent ([place])   . Chronic sinusitis   . Coronary artery disease   . Depression   . Difficult intubation    was told with shoulder done 2006-alittle narrow  . 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 Hypertension   . Jejunostomy tube fell out    when asked about this in 04/2017, pt denied ever having a J tube, feeding tube, tubes placed post surgery so ??? veracity of a previous J tube.    . 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 Spastic colon   . Squamous cell carcinoma of lung, stage I, right ([place]) 05/07/2018   bx 04/29/18; isolated PET uptake in RLL mass  . Stroke (cerebrum) ([place])   . SVT (supraventricular tachycardia) (HCC)    Past Surgical History:  Procedure Laterality Date  . ATRIAL FIBRILLATION ABLATION N/A 02/11/2019   Procedure: ATRIAL FIBRILLATION ABLATION;  Surgeon: Constance Haw, MD;  Location: [facility];  Service: Cardiovascular;  Laterality: N/A;  . CARDIAC CATHETERIZATION    . CATARACT EXTRACTION, BILATERAL    . COLONOSCOPY WITH PROPOFOL N/A 04/17/2017   Procedure: COLONOSCOPY WITH PROPOFOL;  Surgeon:  Yetta Flock, MD;  Location: [facility];  Service: Gastroenterology;  Laterality: N/A;  . ESOPHAGEAL DILATION  2017  . ESOPHAGOGASTRODUODENOSCOPY (EGD) WITH PROPOFOL N/A 04/17/2017   Procedure: ESOPHAGOGASTRODUODENOSCOPY (EGD) WITH PROPOFOL;  Surgeon: Yetta Flock, MD;  Location: [facility];  Service: Gastroenterology;  Laterality: N/A;  . EYE SURGERY    . FOOT NEUROMA SURGERY  2002  . LEFT HEART CATH AND CORONARY ANGIOGRAPHY N/A 01/16/2019   Procedure: LEFT HEART CATH AND CORONARY ANGIOGRAPHY;  Surgeon: Jettie Booze, MD;  Location: [facility];  Service: Cardiovascular;  Laterality: N/A;  . LEFT HEART CATHETERIZATION WITH CORONARY ANGIOGRAM Right 06/14/2011   20% LM, chronic occluded mid LAD, 50% ostial LCX, 20% mid RI, RCA with collaterals to mid LAD, mid 10% stenosis, EF 60% 06/14/11  . NASAL SINUS SURGERY    . SHOULDER ARTHROSCOPY  03/01/2011   Procedure: ARTHROSCOPY SHOULDER;  Surgeon: Ninetta Lights, MD;  Location: [facility];  Service: Orthopedics;  Laterality: Right;  arthroscopy shoulder decompression subacromial partial acromioplasty with coracoacromial release, distal claviculectomy, debridement of labrium  . SHOULDER ARTHROSCOPY  03/01/2011   Procedure: ARTHROSCOPY SHOULDER;  Surgeon: Ninetta Lights, MD;  Location: [facility];  Service: Orthopedics;  Laterality: Right;  arthroscopy shoulder decompression subacromial partial acromioplasty with coracoacromial release, distal claviculectomy, debridement of labrium  . TEE WITHOUT CARDIOVERSION N/A 03/19/2016   Procedure: TRANSESOPHAGEAL ECHOCARDIOGRAM (TEE);  Surgeon: Larey Dresser,  MD;  Location: [facility];  Service: Cardiovascular;  Laterality: N/A;  . TOTAL KNEE ARTHROPLASTY Right 09/23/2013   Procedure: TOTAL KNEE ARTHROPLASTY;  Surgeon: Ninetta Lights, MD;  Location: [facility];  Service: Orthopedics;  Laterality: Right;  . TOTAL SHOULDER ARTHROPLASTY  06/28/2011   Procedure:  TOTAL SHOULDER ARTHROPLASTY;  Surgeon: Ninetta Lights, MD;  Location: [facility];  Service: Orthopedics;  Laterality: Right;  . UVULOPALATOPHARYNGOPLASTY    . VIDEO ASSISTED THORACOSCOPY (VATS)/WEDGE RESECTION Right 06/02/2018   Procedure: VIDEO ASSISTED THORACOSCOPY (VATS)/WEDGE RESECTION with Lymph node disection and intercostal nerve block.;  Surgeon: [name], MD;  Location: [facility];  Service: Thoracic;  Laterality: Right;    Current Outpatient Medications  Medication Sig Dispense Refill  . acetaminophen (TYLENOL) 325 MG tablet Take 650 mg by mouth every 6 (six) hours as needed for moderate pain.    Marland Kitchen apixaban (ELIQUIS) 5 MG TABS tablet Take 1 tablet (5 mg total) by mouth 2 (two) times daily. 60 tablet 0  . Cholecalciferol (VITAMIN D3) 5000 units TABS Take 5,000 Units by mouth daily.    Marland Kitchen dofetilide (TIKOSYN) 250 MCG capsule TAKE 1 CAPSULE BY MOUTH TWO TIMES DAILY 180 capsule 3  . ferrous sulfate (FEROSUL) 325 (65 FE) MG tablet Take 1 tablet (325 mg total) by mouth 2 (two) times daily with a meal.    . fluticasone (FLONASE) 50 MCG/ACT nasal spray Place 1-2 sprays into both nostrils daily as needed for allergies or rhinitis.    . furosemide (LASIX) 40 MG tablet Take 1 tablet (40 mg total) by mouth daily for 7 days, THEN 1 tablet (40 mg total) 3 (three) times a week for 23 days. 20 tablet 0  . irbesartan (AVAPRO) 150 MG tablet TAKE 1 TABLET(150 MG) BY MOUTH DAILY 90 tablet 3  . magnesium oxide (MAG-OX) 400 MG tablet Take 1 tablet (400 mg total) by mouth 2 (two) times daily. 60 tablet 0  . Melatonin 10 MG TABS Take 10 mg by mouth at bedtime as needed (sleep).     . metoprolol tartrate (LOPRESSOR) 25 MG tablet TAKE 1 TABLET BY MOUTH 2  TIMES DAILY. 180 tablet 2  . Multiple Vitamins-Minerals (PRESERVISION AREDS 2 PO) Take 1 capsule by mouth 2 (two) times daily.    . pantoprazole (PROTONIX) 40 MG tablet Take 40 mg by mouth daily.     Marland Kitchen PARoxetine (PAXIL) 40 MG tablet Take 40  mg by mouth every morning.    . polycarbophil (FIBERCON) 625 MG tablet Take 1,250 mg by mouth at bedtime.     [name] Glycol-Propyl Glycol (SYSTANE OP) Place 2 drops into both eyes 2 (two) times daily as needed (dry eyes).     . potassium chloride 20 MEQ TBCR take 1 tab by mouth every day that you take lasix. 20 tablet 0  . rosuvastatin (CRESTOR) 5 MG tablet TAKE 1 TABLET BY MOUTH  EVERY MORNING 90 tablet 3  . tamsulosin (FLOMAX) 0.4 MG CAPS capsule Take 0.4 mg by mouth at bedtime.   11  . metoprolol tartrate (LOPRESSOR) 25 MG tablet Take 1-2 tablets (25-50 mg total) by mouth daily as needed (for breakthrough palpitations). 180 tablet 3   No current facility-administered medications for this encounter.    Allergies  Allergen Reactions  . Glucosamine-Chondroitin Anaphylaxis and Other (See Comments)    Stomach cramps, can't eat   . Tetanus Toxoid Anaphylaxis and Hives    hives/throat swells shut   . Fish Oil Other (  See Comments)    Stomach cramps, can't eat   . Ibuprofen Nausea Only and Other (See Comments)    Stomach pains   . Naproxen Diarrhea    Social History   Socioeconomic History  . Marital status: Married    Spouse name: Not on file  . Number of children: 3  . Years of education: Not on file  . Highest education level: Not on file  Occupational History  . Occupation: retired  Tobacco Use  . Smoking status: Former Smoker    Packs/day: 2.00    Years: 30.00    Pack years: 60.00    Types: Cigarettes    Quit date: 02/27/1998    Years since quitting: 21.0  . Smokeless tobacco: Never Used  Substance and Sexual Activity  . Alcohol use: Yes    Alcohol/week: 14.0 standard drinks    Types: 14 Standard drinks or equivalent per week    Comment: 2 drinks daily (bourbon)  . Drug use: No  . Sexual activity: Not on file  Other Topics Concern  . Not on file  Social History Narrative   Patient is left handed.   Patient drinks one cup caffeine daily.   Social  Determinants of Health   Financial Resource Strain:   . Difficulty of Paying Living Expenses: Not on file  Food Insecurity:   . Worried About Charity fundraiser in the Last Year: Not on file  . Ran Out of Food in the Last Year: Not on file  Transportation Needs:   . Lack of Transportation (Medical): Not on file  . Lack of Transportation (Non-Medical): Not on file  Physical Activity:   . Days of Exercise per Week: Not on file  . Minutes of Exercise per Session: Not on file  Stress:   . Feeling of Stress : Not on file  Social Connections:   . Frequency of Communication with Friends and Family: Not on file  . Frequency of Social Gatherings with Friends and Family: Not on file  . Attends Religious Services: Not on file  . Active Member of Clubs or Organizations: Not on file  . Attends Archivist Meetings: Not on file  . Marital Status: Not on file  Intimate Partner Violence:   . Fear of Current or Ex-Partner: Not on file  . Emotionally Abused: Not on file  . Physically Abused: Not on file  . Sexually Abused: Not on file    Family History  Problem Relation Age of Onset  . Cancer Mother        colon  . Heart attack Father   . Migraines Brother     ROS- All systems are reviewed and negative except as per the HPI above  Physical Exam: Vitals:   03/11/19 1347  BP: 104/64  Pulse: 76  Weight: 96.5 kg  Height: 5\' 10"  (1.778 m)   Wt Readings from Last 3 Encounters:  03/11/19 96.5 kg  02/24/19 95.6 kg  02/17/19 95.1 kg    Labs: Lab Results  Component Value Date   NA 139 02/24/2019   K 4.4 02/24/2019   CL 100 02/24/2019   CO2 25 02/24/2019   GLUCOSE 148 (H) 02/24/2019   BUN 18 02/24/2019   CREATININE 1.05 02/24/2019   CALCIUM 9.6 02/24/2019   MG 2.1 02/24/2019   Lab Results  Component Value Date   INR 1.5 (H) 02/14/2019   No results found for: CHOL, HDL, LDLCALC, TRIG   GEN- The patient is well appearing,  alert and oriented x 3 today.   Head-  normocephalic, atraumatic Eyes-  Sclera clear, conjunctiva pink Ears- hearing intact Oropharynx- clear Neck- supple, no JVP Lymph- no cervical lymphadenopathy Lungs- Clear to ausculation bilaterally, normal work of breathing Heart- Regular rate and rhythm, no murmurs, rubs or gallops, PMI not laterally displaced GI- soft, NT, ND, + BS Extremities- no clubbing, cyanosis, or edema MS- no significant deformity or atrophy Skin- no rash or lesion Psych- euthymic mood, full affect Neuro- strength and sensation are intact  EKG-NSR at 76 bpm, PR int 156 ms, qrs int 86 ms, qtc 463 ms Epic records reviewed    Assessment and Plan: 1. Afib S/p ablation Has only noted one short episode of arrhythmia since ablation.  In SR today  Continue metoprolol tartrate 25 mg bid and dofetilide 250 mcg bid Recent dofetilide labs stable  2. CHA2DS2VASc of at least 6 Continue eliquis 5 mg bid   3. HTN Stable   F/u with Dr. Curt Bears 3/1  Geroge Baseman. Arrin Ishler, [facility] [address] [place], [place] [address] [phone]

## 2019-03-25 ENCOUNTER — Other Ambulatory Visit: Payer: Self-pay | Admitting: Internal Medicine

## 2019-03-25 ENCOUNTER — Other Ambulatory Visit: Payer: Self-pay | Admitting: Cardiology

## 2019-03-25 MED ORDER — APIXABAN 5 MG PO TABS: 5.0000 mg | ORAL_TABLET | Freq: Two times a day (BID) | ORAL | 1 refills | Status: DC

## 2019-03-25 NOTE — Telephone Encounter (Signed)
*  STAT* If patient is at the pharmacy, call can be transferred to refill team.   1. Which medications need to be refilled? (please list name of each medication and dose if known)  New prescription for Eliquis   2. Which pharmacy/location (including street and city if local pharmacy) is medication to be sent to? [facility], [place], [place]*  3. Do they need a 30 day or 90 day supply? 90 days and refills

## 2019-03-25 NOTE — Telephone Encounter (Signed)
Pt last saw Roderic Palau, NP on 03/11/19, last labs 02/24/19 Creat 1.05, age 79, weight 96.5kg, based on specified criteria pt is on appropriate dosage of Eliquis 5mg  BID.  Will refill rx.

## 2019-04-01 ENCOUNTER — Telehealth: Payer: Self-pay | Admitting: Internal Medicine

## 2019-04-01 NOTE — Telephone Encounter (Signed)
We are recommending the COVID-19 vaccine to all of our patients. Cardiac medications (including blood thinners) should not deter anyone from being vaccinated and there is no need to hold any of those medications prior to vaccine administration.     Currently, there is a hotline to call (active 03/20/19) to schedule vaccination appointments as no walk-ins will be accepted.   Number: [phone].    If an appointment is not available please go to [email or web address] to sign up for notification when additional vaccine appointments are available.   If you have further questions or concerns about the vaccine process, please visit [email or web address] or contact your primary care physician.  Pt was read above information and said she understood

## 2019-04-02 ENCOUNTER — Ambulatory Visit: Payer: Medicare Other | Admitting: Cardiothoracic Surgery

## 2019-04-03 ENCOUNTER — Other Ambulatory Visit: Payer: Self-pay

## 2019-04-03 ENCOUNTER — Encounter: Payer: Self-pay | Admitting: Cardiothoracic Surgery

## 2019-04-03 ENCOUNTER — Ambulatory Visit (INDEPENDENT_AMBULATORY_CARE_PROVIDER_SITE_OTHER): Payer: Medicare Other | Admitting: Cardiothoracic Surgery

## 2019-04-03 VITALS — BP 104/70 | HR 82 | Temp 97.4°F | Resp 20 | Ht 70.0 in | Wt 214.0 lb

## 2019-04-03 DIAGNOSIS — C3491 Malignant neoplasm of unspecified part of right bronchus or lung: Secondary | ICD-10-CM | POA: Diagnosis not present

## 2019-04-03 DIAGNOSIS — Z08 Encounter for follow-up examination after completed treatment for malignant neoplasm: Secondary | ICD-10-CM | POA: Diagnosis not present

## 2019-04-03 NOTE — Progress Notes (Signed)
[address]       [place] [address]             [phone]        Evaluation Performed:  Follow-up visit [place] Record #[id number] Date of Birth: 05-Apr-1940  Referring QP:YPPJ, Christena Deem, MD Primary Cardiology: Primary Care:Miller, Lattie Haw, MD  Chief Complaint:  Follow Up Visit  Cancer Staging Lung cancer [facility]) Staging form: Lung, AJCC 8th Edition - Clinical: No stage assigned - Unsigned - Pathologic stage from 06/05/2018: Stage IB (pT2a, pN0, cM0) - Signed by [name], MD on 06/05/2018   OPERATIVE REPORT DATE OF PROCEDURE:  06/02/2018 PREOPERATIVE DIAGNOSIS:  Squamous cell carcinoma of the right lower lobe. POSTOPERATIVE DIAGNOSIS:  Squamous cell carcinoma of the right lower lobe. SURGICAL PROCEDURES:  Right video-assisted thoracoscopy,  superior segmentectomy, right lower lobe with lymph node dissection and intercostal nerve block, right 4, 5, 6 and 7 intercostal spaces. 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  Initially the patient was discharged home on home oxygen.  His respiratory functional status is continued to improve he notes that he no longer uses oxygen.    Since last seen the patient has had some cardiac issues including atrial fibrillation intermittently, he had A. fib ablation done.  Following this he was readmitted with congestive heart failure and fluid overload responded to diuresis.  In the evaluation for his A. fib ablation a contrast CT of the chest was done in December 2020, a CT of the  chest without contrast was done in October 2020 as follow-up for his lung cancer.  He is not using oxygen at home currently, is able to do usual activities without shortness of breath.  Zubrod Score: At the time of surgery this patient's most appropriate activity status/level should be described as: []     0    Normal activity, no symptoms [x]     1    Restricted in physical strenuous activity but ambulatory, able to do out light work []     2    Ambulatory and capable of self care, unable to do work activities, up and about                 >50 % of waking hours                                                                                   []     3    Only limited self care, in bed greater than 50% of waking hours []     4    Completely disabled, no self care, confined to bed or chair []     5    Moribund  Social History   Tobacco  Use  Smoking Status Former Smoker  . Packs/day: 2.00  . Years: 30.00  . Pack years: 60.00  . Types: Cigarettes  . Quit date: 02/27/1998  . Years since quitting: 21.1  Smokeless Tobacco Never Used       Allergies  Allergen Reactions  . Glucosamine-Chondroitin Anaphylaxis and Other (See Comments)    Stomach cramps, can't eat   . Tetanus Toxoid Anaphylaxis and Hives    hives/throat swells shut   . Fish Oil Other (See Comments)    Stomach cramps, can't eat   . Ibuprofen Nausea Only and Other (See Comments)    Stomach pains   . Naproxen Diarrhea    Current Outpatient Medications  Medication Sig Dispense Refill  . acetaminophen (TYLENOL) 325 MG tablet Take 650 mg by mouth every 6 (six) hours as needed for moderate pain.    Marland Kitchen apixaban (ELIQUIS) 5 MG TABS tablet Take 1 tablet (5 mg total) by mouth 2 (two) times daily. 180 tablet 1  . Cholecalciferol (VITAMIN D3) 5000 units TABS Take 5,000 Units by mouth daily.    Marland Kitchen dofetilide (TIKOSYN) 250 MCG capsule TAKE 1 CAPSULE BY MOUTH TWO TIMES DAILY 180 capsule 3  . ferrous sulfate (FEROSUL) 325 (65 FE) MG  tablet Take 1 tablet (325 mg total) by mouth 2 (two) times daily with a meal.    . fluticasone (FLONASE) 50 MCG/ACT nasal spray Place 1-2 sprays into both nostrils daily as needed for allergies or rhinitis.    Marland Kitchen irbesartan (AVAPRO) 150 MG tablet TAKE 1 TABLET(150 MG) BY MOUTH DAILY 90 tablet 3  . magnesium oxide (MAG-OX) 400 MG tablet Take 1 tablet (400 mg total) by mouth 2 (two) times daily. 60 tablet 0  . Melatonin 10 MG TABS Take 10 mg by mouth at bedtime as needed (sleep).     . metoprolol tartrate (LOPRESSOR) 25 MG tablet TAKE 1 TABLET BY MOUTH 2  TIMES DAILY. 180 tablet 2  . Multiple Vitamins-Minerals (PRESERVISION AREDS 2 PO) Take 1 capsule by mouth 2 (two) times daily.    . pantoprazole (PROTONIX) 40 MG tablet Take 40 mg by mouth daily.     Marland Kitchen PARoxetine (PAXIL) 40 MG tablet Take 40 mg by mouth every morning.    . polycarbophil (FIBERCON) 625 MG tablet Take 1,250 mg by mouth at bedtime.     [name] Glycol-Propyl Glycol (SYSTANE OP) Place 2 drops into both eyes 2 (two) times daily as needed (dry eyes).     . potassium chloride 20 MEQ TBCR take 1 tab by mouth every day that you take lasix. 20 tablet 0  . rosuvastatin (CRESTOR) 5 MG tablet TAKE 1 TABLET BY MOUTH  EVERY MORNING 90 tablet 3  . tamsulosin (FLOMAX) 0.4 MG CAPS capsule Take 0.4 mg by mouth at bedtime.   11  . furosemide (LASIX) 40 MG tablet Take 1 tablet (40 mg total) by mouth daily for 7 days, THEN 1 tablet (40 mg total) 3 (three) times a week for 23 days. 20 tablet 0  . metoprolol tartrate (LOPRESSOR) 25 MG tablet Take 1-2 tablets (25-50 mg total) by mouth daily as needed (for breakthrough palpitations). 180 tablet 3   No current facility-administered medications for this visit.       Physical Exam: BP 104/70   Pulse 82   Temp (!) 97.4 F (36.3 C)   Resp 20   Ht 5\' 10"  (1.778 m)   Wt 97.1 kg   SpO2 94% Comment: on RA  BMI  30.71 kg/m  General appearance: alert, cooperative and no distress Head: Normocephalic,  without obvious abnormality, atraumatic Neck: no adenopathy, no carotid bruit, no JVD, supple, symmetrical, trachea midline and thyroid not enlarged, symmetric, no tenderness/mass/nodules Lymph nodes: Cervical, supraclavicular, and axillary nodes normal. Resp: clear to auscultation bilaterally Cardio: regular rate and rhythm, S1, S2 normal, no murmur, click, rub or gallop GI: soft, non-tender; bowel sounds normal; no masses,  no organomegaly Extremities: extremities normal, atraumatic, no cyanosis or edema and Homans sign is negative, no sign of DVT Neurologic: Grossly normal   Diagnostic Studies & Laboratory data:         Recent Radiology Findings: CLINICAL DATA:  Short of breath, recent cardiac ablation for supraventricular tachycardia.  EXAM: CT ANGIOGRAPHY CHEST WITH CONTRAST  TECHNIQUE: Multidetector CT imaging of the chest was performed using the standard protocol during bolus administration of intravenous contrast. Multiplanar CT image reconstructions and MIPs were obtained to evaluate the vascular anatomy.  CONTRAST:  142mL OMNIPAQUE IOHEXOL 350 MG/ML SOLN  COMPARISON:  Chest CT 01/02/2019  FINDINGS: Cardiovascular: No filling defects within the pulmonary arteries to suggest acute pulmonary embolism. No acute findings of the aorta or great vessels. No pericardial fluid.  Coronary artery calcification and aortic atherosclerotic calcification.  Mediastinum/Nodes: No axillary supraclavicular adenopathy. Mild RIGHT hilar adenopathy with a 14 mm hilar lymph node.  Lungs/Pleura: New small RIGHT pleural effusion. Small focus of consolidation at the RIGHT lung base measuring 2.5 cm (image 94/6). There is mild interlobular septal thickening in the lungs. Mild ground-glass pattern lungs additionally.  Upper Abdomen: Limited view of the liver, kidneys, pancreas are unremarkable. Normal adrenal glands. Nonobstructing calculus in the upper pole of the LEFT kidney  measuring 3 mm.  Musculoskeletal: No aggressive osseous lesion  Review of the MIP images confirms the above findings.  IMPRESSION: 1. No evidence acute pulmonary embolism. 2. New small RIGHT pleural effusion.  Mild pulmonary edema pattern. 3. Small focus of consolidation at the RIGHT lung base is favored atelectasis. Cannot exclude small focus of infection. Infarction unlikely. 4. Mild RIGHT hilar adenopathy is likely reactive. 5. Aortic Atherosclerosis (ICD10-I70.0).   Electronically Signed   By: Suzy Bouchard M.D.   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  He has a follow-up scan scheduled for March through oncology I plan to see him back 6 months from now-July 2021.     To stagger his visits and I will plan to see back in July 2021    Medication Changes: No orders of the defined types were placed in this encounter.     [name] 04/03/2019 11:50 AM

## 2019-04-06 ENCOUNTER — Ambulatory Visit: Payer: Medicare Other | Attending: Internal Medicine

## 2019-04-06 DIAGNOSIS — Z23 Encounter for immunization: Secondary | ICD-10-CM | POA: Insufficient documentation

## 2019-04-06 NOTE — Progress Notes (Signed)
   Covid-19 Vaccination Clinic  Name:  VIVIANO BIR    MRN: [id number] DOB: 1940-12-03  04/06/2019  Mr. Cubero was observed post Covid-19 immunization for 30 minutes based on pre-vaccination screening without incidence. He was provided with Vaccine Information Sheet and instruction to access the V-Safe system.   Mr. Plemons was instructed to call 911 with any severe reactions post vaccine: Marland Kitchen Difficulty breathing  . Swelling of your face and throat  . A fast heartbeat  . A bad rash all over your body  . Dizziness and weakness    Immunizations Administered    Name Date Dose VIS Date Route   Pfizer COVID-19 Vaccine 04/06/2019  9:34 AM 0.3 mL 02/20/2019 Intramuscular   Manufacturer: [place]   Lot: [id number]   [place]: [id number]

## 2019-04-13 ENCOUNTER — Other Ambulatory Visit: Payer: Self-pay

## 2019-04-13 ENCOUNTER — Ambulatory Visit (INDEPENDENT_AMBULATORY_CARE_PROVIDER_SITE_OTHER): Payer: Medicare Other | Admitting: Internal Medicine

## 2019-04-13 ENCOUNTER — Ambulatory Visit (INDEPENDENT_AMBULATORY_CARE_PROVIDER_SITE_OTHER): Payer: Medicare Other

## 2019-04-13 ENCOUNTER — Encounter: Payer: Self-pay | Admitting: Internal Medicine

## 2019-04-13 VITALS — BP 100/60 | HR 75 | Temp 97.5°F | Ht 70.0 in | Wt 215.4 lb

## 2019-04-13 DIAGNOSIS — R0600 Dyspnea, unspecified: Secondary | ICD-10-CM

## 2019-04-13 DIAGNOSIS — R0609 Other forms of dyspnea: Secondary | ICD-10-CM

## 2019-04-13 NOTE — Patient Instructions (Addendum)
To get the most out of exercise, you need to be continuously aware that you are short of breath, but never out of breath, for 30 minutes daily. As you improve, it will actually be easier for you to do the same amount of exercise  in  30 minutes so always push to the level where you are short of breath.     Please remember to go to the x-ray department   for your tests - we will call you with the results when they are available.   We will call to set up a CPST in about a month and call you with result Mild anemia noted and needs repeat cbc/tsh/bnp / esr prior to cpst           

## 2019-04-13 NOTE — Progress Notes (Signed)
Subjective:    Patient ID: George [place], male    DOB: June 17, 1940,    MRN: [id number]  Brief patient profile:   28 yowm quit smoking 1999 slowed down by arthritis but new waking from sleeping with sob x around 11/2015 referred to pulmonary clinic 01/20/2016 by Dr  Lovena Le with abn pfts 12/16/15      History of Present Illness  01/20/2016 1st [facility] office visit/ Amauria Younts   Chief Complaint  Patient presents with  . Pulmonary Consult    Referred by Dr. Lovena Le. Pt c/o SOB off and on for the past 2 months. He states it mainly bothers him when he lies down. He occ gets SOB after he exerts himself.    wakes up sob x 2 months once a week x 20 sec, couple dep breaths and feels better.  Also wife hears noisy breathing at rest during same time period/ pt has sense of pnds and freq clearing his throat daytime only  He's more concerned with fatigue limiting activity tol than sob  Note has h/o acei cough remotely, resolved on arb rec Omeprazole 40   Take  30-60 min before first meal of the day and Pepcid (famotidine)  20 mg one @  bedtime until return to office -did not do GERD  Diet ? followed Please remember to go to the lab Allergy profile 01/20/2016 >  Eos 0.6 /  IgE  319 with RAST Mold > dog ? Low grade ABPA ? > rec f/u to discuss options but did not return and not interested in allergy eval or rec to keep dogs out of bedroom       04/15/2017  f/u ov/Bernardo Brayman re:  MPNs / worse doe x one month Chief Complaint  Patient presents with  . Follow-up    Pt states he is having worsening SOB mainly with exertion. Denies any cough or CP.  Dyspnea:  MMRC3 = can't walk 100 yards even at a slow pace at a flat grade s stopping due to sob   Cough: no Sleep: ok L side down / 6 in under hob > once settles down does ok  Has had increased swelling L leg and pallor over last month also / no obvious bleeding on xarelto  rec Pantoprazole should be Take 30-60 min before first meal of the day  Please  remember to go to the lab department downstairs in the basement  for your tests - we will call you with the results when they are available. hgb 6.9 > admit  Admit date: 04/15/2017 Discharge date: 04/18/2017  Time spent: 45 minutes  Recommendations for Outpatient Follow-up:  Patient will be discharged to home.  Patient will need to follow up with primary care provider within one week of discharge.  Follow-up with Dr. Beryle Beams, hematologist.  Follow-up with Dr. Havery Moros, gastroenterologist.  Patient should continue medications as prescribed.  Patient should follow a heart healthy diet.   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 Follow-up    PFT's today. Breathing has improved back to his normal baseline. No new co's.   Dyspnea:  Steps ok now, really Not limited by breathing from desired activities   Cough: no Sleep: ok 5 in blocks  rec F/u fr ct's  only    04/22/2018  f/u ov/Neeva Trew re: doe/ abn cxr  Chief Complaint  Patient presents with  . Follow-up    Here to discuss PET scan results. He has occ SOB that comes and goes.   Dyspnea:  Very sedentary / ok flat/ most he does = 3 steps no problems/ legs tend to stop him before his breathing  Cough: minimal / mucoid in am never bloody  Sleeping: lie flat one pillow  SABA use: none 02: none  rec CT bx RLL  04/29/2018 >>> Sq cell ca > referred to [facility] 05/01/2018 > RLL sup segmentectomy 06/02/2018 Servando Snare) - no adjuvant rx needed    10/14/2018  f/u ov/Marquarius Lofton re: doe with transient hypoxemia post op f/u segmentectomy on R not using 02  Chief Complaint  Patient presents with  . Follow-up    Breathing is doing well and no new co's.   Dyspnea:  Walks 10 min 4  x weekly nl pace knees limit with sats 93-95 most days  Cough: none Sleeping: flat rotated to L / one pillow SABA use: no 02: none  rec To get the most out of exercise, you need to be continuously aware that you are short of breath, but never out of breath, for 30 minutes daily. As you improve, it will actually be easier for you to do the same amount of exercise  in  30 minutes so always push to the level where you are short of breath.  Observe your 02 saturation at peak of exercise  Ok to stop oxygen as needed    04/13/2019  f/u ov/Samanda Buske re:  Doe feels like  losing ground "I think it's my emphysema" Chief Complaint  Patient presents with  . Acute Visit    Increased DOE since had cardiac ablasion in Dec 2020.   Dyspnea:  House to mb is slt uphill and sob but then out of breath when gets to house walking back downhill  Cough: none  Sleeping: flat bed on side/ one pillow  SABA use: none  02: none      No obvious day to day or daytime variability or assoc excess/ purulent sputum or mucus plugs or hemoptysis or cp or chest tightness, subjective wheeze or overt sinus or hb symptoms.   Sleeping as above without nocturnal  or early am exacerbation  of respiratory  c/o's or need for noct saba. Also denies any obvious fluctuation of symptoms with weather or environmental changes or other aggravating or alleviating factors except as outlined above   No unusual exposure hx or h/o childhood pna/ asthma or knowledge of premature birth.  Current Allergies, Complete Past Medical History, Past Surgical History, Family History, and Social History were reviewed in [employer] record.  ROS  The following are not active complaints unless bolded Hoarseness, sore throat, dysphagia, dental problems, itching, sneezing,  nasal congestion or discharge of excess mucus or purulent secretions, ear ache,   fever, chills, sweats, unintended wt loss or wt gain, classically pleuritic or  exertional cp,  orthopnea pnd or arm/hand swelling  or leg swelling, presyncope, palpitations, abdominal pain, anorexia, nausea, vomiting, diarrhea  or change in bowel habits or change in bladder habits, change in stools or change in urine, dysuria, hematuria,  rash, arthralgias, visual complaints, headache, numbness, weakness or ataxia or problems with walking or coordination,  change in mood or  memory.        Current Meds  Medication Sig  . acetaminophen (TYLENOL) 325  MG tablet Take 650 mg by mouth every 6 (six) hours as needed for moderate pain.  Marland Kitchen apixaban (ELIQUIS) 5 MG TABS tablet Take 1 tablet (5 mg total) by mouth 2 (two) times daily.  . Cholecalciferol (VITAMIN D3) 5000 units TABS Take 5,000 Units by mouth daily.  Marland Kitchen dofetilide (TIKOSYN) 250 MCG capsule TAKE 1 CAPSULE BY MOUTH TWO TIMES DAILY  . ferrous sulfate (FEROSUL) 325 (65 FE) MG tablet Take 1 tablet (325 mg total) by mouth 2 (two) times daily with a meal.  . fluticasone (FLONASE) 50 MCG/ACT nasal spray Place 1-2 sprays into both nostrils daily as needed for allergies or rhinitis.  . furosemide (LASIX) 40 MG tablet Take 1 tablet (40 mg total) by mouth daily for 7 days, THEN 1 tablet (40 mg total) 3 (three) times a week for 23 days.  . irbesartan (AVAPRO) 150 MG tablet TAKE 1 TABLET(150 MG) BY MOUTH DAILY  . magnesium oxide (MAG-OX) 400 MG tablet Take 1 tablet (400 mg total) by mouth 2 (two) times daily.  . Melatonin 10 MG TABS Take 10 mg by mouth at bedtime as needed (sleep).   . metoprolol tartrate (LOPRESSOR) 25 MG tablet TAKE 1 TABLET BY MOUTH 2  TIMES DAILY.  . Multiple Vitamins-Minerals (PRESERVISION AREDS 2 PO) Take 1 capsule by mouth 2 (two) times daily.  . pantoprazole (PROTONIX) 40 MG tablet Take 40 mg by mouth daily.   Marland Kitchen PARoxetine (PAXIL) 40 MG tablet Take 40 mg by mouth at bedtime.   . polycarbophil (FIBERCON) 625 MG tablet Take 1,250 mg by mouth at bedtime.   [name] Glycol-Propyl Glycol (SYSTANE OP) Place 2 drops  into both eyes 2 (two) times daily as needed (dry eyes).   . potassium chloride 20 MEQ TBCR take 1 tab by mouth every day that you take lasix.  Marland Kitchen rosuvastatin (CRESTOR) 5 MG tablet TAKE 1 TABLET BY MOUTH  EVERY MORNING  . tamsulosin (FLOMAX) 0.4 MG CAPS capsule Take 0.4 mg by mouth at bedtime.                    Objective:   Physical Exam   amb wm  nad  Vital signs reviewed  04/13/2019  - Note at rest 02 sats  95% on RA      04/13/2019       215  10/14/2018       209 04/22/2018     213  05/15/2017        212   04/15/2017        222  10/09/2016      222   01/20/16 218 lb 9.6 oz (99.2 kg)  12/14/15 212 lb (96.2 kg)  11/17/15 211 lb 4.8 oz (95.8 kg)        HEENT : pt wearing mask not removed for exam due to covid -19 concerns.    NECK :  without JVD/Nodes/TM/ nl carotid upstrokes bilaterally   LUNGS: no acc muscle use,  Nl contour chest with minimal decreased bs R base  without cough on insp or exp maneuvers   CV:  RRR  no s3 or murmur or increase in P2, and no edema   ABD:  soft and nontender with nl inspiratory excursion in the supine position. No bruits or organomegaly appreciated, bowel sounds nl  MS:  Nl gait/ ext warm without deformities, calf tenderness, cyanosis or clubbing No obvious joint restrictions   SKIN: warm and dry without lesions    NEURO:  alert, approp, nl sensorium with  no motor or cerebellar deficits apparent.           CXR PA and Lateral:   04/13/2019 :    I personally reviewed images   impression as follows:   No change elevation of R HD same since surgery / no obvious effusion     labs reviewed  Lab Results  Component Value Date   HGB 10.7 (L) 02/16/2019   HGB 10.8 (L) 02/15/2019   HGB 11.2 (L) 02/14/2019   HGB 14.2 01/30/2019   HGB 13.9 01/13/2019   HGB 13.3 01/02/2019   HGB 13.2 07/07/2018   HGB 13.8 05/15/2018   HGB 12.9 (L) 05/08/2017          Assessment & Plan:

## 2019-04-14 ENCOUNTER — Telehealth: Payer: Self-pay

## 2019-04-14 ENCOUNTER — Encounter: Payer: Self-pay | Admitting: Internal Medicine

## 2019-04-14 DIAGNOSIS — R0609 Other forms of dyspnea: Secondary | ICD-10-CM

## 2019-04-14 NOTE — Assessment & Plan Note (Addendum)
Onset 2017 PFTs  12/16/15  FVC  3.32 (81%) no obst and DLCO 59/58c and 77% with correction for alv vol - Spirometry 01/20/2016  FVC  3.12 (74%) and exp truncation  - 01/20/2016  Walked RA x 3 laps @ 185 ft each stopped due to  End of study, nl pace, mild sob with cough at end  - HRCT CT 04/09/2017 1. Previously noted nodules are stable compared to the prior study. These are strongly favored to be benign. The nodular area of architectural distortion in the posterior aspect of the right lower lobe is most likely an area of post infectious or inflammatory scarring. Repeat noncontrast chest CT could be considered in 12 months to ensure continued stability. 2. Mild diffuse bronchial wall thickening with mild to moderate centrilobular and paraseptal emphysema. 3. Aortic atherosclerosis, in addition to left main and 3 vessel coronary artery disease - 04/15/2017   Walked RA x one lap @ 185 stopped due sob with ok sats but Hgb 6.9 > sent to ER as on xarelto with evidence of possible fluid overload on exam  - PFT's  05/15/2017  FEV1 2.31 (79 % ) ratio 77  p 11 % improvement from saba p 0 prior to study with DLCO  59/63 % corrects to 94  % for alv volume  - 04/22/2018   Walked RA  2 laps @  approx 252f each @ fast pace  stopped due to  End of study with sats 91%   - Spirometry 04/22/2018  FEV1 1.6 (56%)  Ratio 0.74 min curvature s prior rx   - Echo 147/06/2593:  G 1 diastolic dysfunction only, nl LA, nl R side - 04/13/2019   Walked RA x two laps =  approx 5052f@ fast pace - stopped due to end of study/ sob  with sats of 92 % at the end of the study.  He has noted doe since segmentectomy but now says he's losing ground with only new problem being afib but now post ablation "no better" but admittedly also very deconditioned.  rec reconditioning if knees will allow then proceed with either bicycle or treadmill CPST in 4-6 weeks - can cancel if improves with conditioning.  No evidence at all to support PE (he's on  eliquis already), cor pulmonale by recent echo or COPD emphysema or otherwise by CT or pfts. Mild anemia noted and needs repeat cbc/tsh/bnp / esr prior to cpst    Discussed in detail all the  indications, usual  risks and alternatives  relative to the benefits with patient who agrees to proceed with w/u as outlined.             Each maintenance medication was reviewed in detail including emphasizing most importantly the difference between maintenance and prns and under what circumstances the prns are to be triggered using an action plan format where appropriate.  Total time for H and P, chart review, counseling, teaching device and generating customized AVS unique to this office visit / charting = 30 m

## 2019-04-14 NOTE — Telephone Encounter (Signed)
-----  Message from Tanda Rockers, MD sent at 04/14/2019  8:45 AM EST ----- Call pt:  Reviewed cxr and no acute change so no change in recommendations made at ov  Also need to set up cpst in 4-6 weeks (he can cancel if doing better with exercise in meantime)  but after chart review needs  repeat cbc/tsh/bnp / esr  at his convenience but def before scheduling cpst

## 2019-04-16 ENCOUNTER — Ambulatory Visit: Payer: Medicare Other | Admitting: Cardiothoracic Surgery

## 2019-04-20 ENCOUNTER — Other Ambulatory Visit (HOSPITAL_COMMUNITY): Payer: Self-pay | Admitting: Interventional Radiology

## 2019-04-20 DIAGNOSIS — I771 Stricture of artery: Secondary | ICD-10-CM

## 2019-04-22 ENCOUNTER — Other Ambulatory Visit: Payer: Self-pay

## 2019-04-22 ENCOUNTER — Telehealth: Payer: Self-pay

## 2019-04-22 ENCOUNTER — Ambulatory Visit (INDEPENDENT_AMBULATORY_CARE_PROVIDER_SITE_OTHER): Payer: Medicare Other | Admitting: Orthopedic Surgery

## 2019-04-22 DIAGNOSIS — M1712 Unilateral primary osteoarthritis, left knee: Secondary | ICD-10-CM

## 2019-04-22 NOTE — Telephone Encounter (Signed)
Submitted for VOB for synvisc one- left knee Pt is a new start

## 2019-04-22 NOTE — Telephone Encounter (Signed)
Reminder to preapprove for left knee gel injection  Dr dean pt.

## 2019-04-23 ENCOUNTER — Telehealth: Payer: Self-pay

## 2019-04-23 NOTE — Telephone Encounter (Signed)
Approved for Synvisc one-left knee Dr Latanya Presser and [facility] No Copay No OOP No prior auth required

## 2019-04-23 NOTE — Telephone Encounter (Signed)
Called and scheduled appointment for friday 05/01/19 with dr. Marlou Sa

## 2019-04-25 ENCOUNTER — Encounter: Payer: Self-pay | Admitting: Orthopedic Surgery

## 2019-04-25 DIAGNOSIS — M1712 Unilateral primary osteoarthritis, left knee: Secondary | ICD-10-CM | POA: Diagnosis not present

## 2019-04-25 MED ORDER — LIDOCAINE HCL 1 % IJ SOLN
5.0000 mL | INTRAMUSCULAR | Status: AC | PRN
  Administered 2019-04-25: 13:00:00 5 mL

## 2019-04-25 MED ORDER — METHYLPREDNISOLONE ACETATE 40 MG/ML IJ SUSP
40.0000 mg | INTRAMUSCULAR | Status: AC | PRN
  Administered 2019-04-25: 40 mg via INTRA_ARTICULAR

## 2019-04-25 MED ORDER — BUPIVACAINE HCL 0.25 % IJ SOLN
4.0000 mL | INTRAMUSCULAR | Status: AC | PRN
  Administered 2019-04-25: 13:00:00 4 mL via INTRA_ARTICULAR

## 2019-04-25 NOTE — Progress Notes (Signed)
Office Visit Note   Patient: George Vega           Date of Birth: December 05, 1940           MRN: [id number] Visit Date: 04/22/2019 Requested by: [name], [place],  [place] [address] PCP: [name], MD  Subjective: No chief complaint on file.   HPI: George Vega is a patient with left knee pain.  He has known left knee arthritis.  The pain is aggravating for him.  Has a new diagnosis of lung cancer since he was last seen.  He also has been developing some significant heart problems.  He would like to have a gel shot.  We will try a cortisone shot first and then get him preapproved for the gel shot.  Getting a pulmonary stress test in March.  The prior injection did help him significantly.              ROS: All systems reviewed are negative as they relate to the chief complaint within the history of present illness.  Patient denies  fevers or chills.   Assessment & Plan: Visit Diagnoses:  1. Unilateral primary osteoarthritis, left knee     Plan: Impression is left knee pain and arthritis.  Plan is cortisone shot today.  Preapproved for gel injection.  Follow-up once this cortisone shot wears off and we will try the gel injection.  Follow-Up Instructions: Return if symptoms worsen or fail to improve.   Orders:  No orders of the defined types were placed in this encounter.  No orders of the defined types were placed in this encounter.     Procedures: Large Joint Inj: L knee on 04/25/2019 1:15 PM Indications: diagnostic evaluation, joint swelling and pain Details: 18 G 1.5 in needle, superolateral approach  Arthrogram: No  Medications: 5 mL lidocaine 1 %; 40 mg methylPREDNISolone acetate 40 MG/ML; 4 mL bupivacaine 0.25 % Outcome: tolerated well, no immediate complications Procedure, treatment alternatives, risks and benefits explained, specific risks discussed. Consent was given by the patient. Immediately prior to procedure a time out was called to verify  the correct patient, procedure, equipment, support staff and site/side marked as required. Patient was prepped and draped in the usual sterile fashion.       Clinical Data: No additional findings.  Objective: Vital Signs: There were no vitals taken for this visit.  Physical Exam:   Constitutional: Patient appears well-developed HEENT:  Head: Normocephalic Eyes:EOM are normal Neck: Normal range of motion Cardiovascular: Normal rate Pulmonary/chest: Effort normal Neurologic: Patient is alert Skin: Skin is warm Psychiatric: Patient has normal mood and affect    Ortho Exam: Ortho exam demonstrates full active and passive range of motion of the right knee.  Left knee has mild restricted flexion contracture of about 5 degrees.  He can bend it to about 115.  No effusion.  Medial greater than lateral joint line tenderness but extensor mechanism is intact.  No other masses lymphadenopathy or skin changes noted in that left knee region. This patient is diagnosed with osteoarthritis of the knee(s).    Radiographs show evidence of joint space narrowing, osteophytes, subchondral sclerosis and/or subchondral cysts.  This patient has knee pain which interferes with functional and activities of daily living.    This patient has experienced inadequate response, adverse effects and/or intolerance with conservative treatments such as acetaminophen, NSAIDS, topical creams, physical therapy or regular exercise, knee bracing and/or weight loss.   This patient has experienced  inadequate response or has a contraindication to intra articular steroid injections for at least 3 months.   This patient is not scheduled to have a total knee replacement within 6 months of starting treatment with viscosupplementation.  Specialty Comments:  No specialty comments available.  Imaging: No results found.   PMFS History: Patient Active Problem List   Diagnosis Date Noted  . Elevated troponin   . Acute  respiratory failure ([facility]) 02/14/2019  . H/O cardiac radiofrequency ablation 02/14/2019  . Acute on chronic diastolic CHF (congestive heart failure) ([facility])   . Paroxysmal atrial fibrillation ([facility]) 02/11/2019  . Lung cancer ([facility]) 06/02/2018  . Prostate nodule 05/15/2018  . Squamous cell carcinoma of lung, stage I, right ([facility]) 05/07/2018  . AVM (arteriovenous malformation) of colon without hemorrhage   . Gastric AVM   . Chronic anticoagulation   . Iron deficiency anemia due to chronic blood loss   . Symptomatic anemia 04/16/2017  . 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 Prediabetes 01/30/2017  . Vitamin D deficiency 01/30/2017  . Multiple pulmonary nodules determined by computed tomography of lung 10/09/2016  . Pulmonary nodules 10/09/2016  . Persistent atrial fibrillation ([facility]) 03/19/2016  . Upper airway cough syndrome 01/21/2016  . Dyspnea on exertion 01/20/2016  . SVT (supraventricular tachycardia) ([facility]) 11/01/2015  . Atrial tachycardia ([facility]) 09/08/2015  . 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 BPH (benign prostatic hyperplasia) 05/19/2014  . CVA (cerebral vascular accident) ([facility]) 02/01/2014  . Benign paroxysmal positional vertigo 01/12/2014  . Headache 01/12/2014  . DTs (delirium tremens) ([facility]) 09/26/2013  . 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 Ataxia 04/02/2013  . Leukocytosis 04/02/2013  . CAD (coronary artery disease) 04/02/2013  . Hyperlipemia 04/02/2013  . HTN (hypertension) 04/02/2013  . Abnormal MRA, brain 11/06/2012  . Arthralgia 09/02/2012  . Synovitis of wrist 07/25/2012  . OA (osteoarthritis) 07/25/2012  . Spastic colon 05/03/2011  . Arthritis 05/03/2011  . Coronary atherosclerosis 11/04/2008  . DEPRESSION 11/03/2008  . DYSTHYMIC DISORDER 10/13/2008  . STRESS ELECTROCARDIOGRAM, ABNORMAL 10/13/2008   Past Medical History:  Diagnosis Date  .  Arthritis   . Atrial fibrillation, persistent ([facility])   . Chronic sinusitis   . Coronary artery disease   . Depression   . Difficult intubation    was told with shoulder done 2006-alittle narrow  . 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 Hypertension   . Jejunostomy tube fell out    when asked about this in 04/2017, pt denied ever having a J tube, feeding tube, tubes placed post surgery so ??? veracity of a previous J tube.    . 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 Spastic colon   . Squamous cell carcinoma of lung, stage I, right ([facility]) 05/07/2018   bx 04/29/18; isolated PET uptake in RLL mass  . Stroke (cerebrum) ([facility])   . SVT (supraventricular tachycardia) ([facility])     Family History  Problem Relation Age of Onset  . Cancer Mother        colon  . Heart attack Father   . Migraines Brother     Past Surgical History:  Procedure Laterality Date  . ATRIAL FIBRILLATION ABLATION N/A 02/11/2019   Procedure: ATRIAL FIBRILLATION ABLATION;  Surgeon: Constance Haw, MD;  Location: [facility];  Service: Cardiovascular;  Laterality: N/A;  . CARDIAC CATHETERIZATION    . CATARACT EXTRACTION, BILATERAL    .  COLONOSCOPY WITH PROPOFOL N/A 04/17/2017   Procedure: COLONOSCOPY WITH PROPOFOL;  Surgeon: Yetta Flock, MD;  Location: [facility];  Service: Gastroenterology;  Laterality: N/A;  . ESOPHAGEAL DILATION  2017  . ESOPHAGOGASTRODUODENOSCOPY (EGD) WITH PROPOFOL N/A 04/17/2017   Procedure: ESOPHAGOGASTRODUODENOSCOPY (EGD) WITH PROPOFOL;  Surgeon: Yetta Flock, MD;  Location: [facility];  Service: Gastroenterology;  Laterality: N/A;  . EYE SURGERY    . FOOT NEUROMA SURGERY  2002  . LEFT HEART CATH AND CORONARY ANGIOGRAPHY N/A 01/16/2019   Procedure: LEFT HEART CATH AND CORONARY ANGIOGRAPHY;  Surgeon: Jettie Booze, MD;  Location: [facility];  Service: Cardiovascular;  Laterality: N/A;  . LEFT HEART CATHETERIZATION WITH CORONARY ANGIOGRAM Right  06/14/2011   20% LM, chronic occluded mid LAD, 50% ostial LCX, 20% mid RI, RCA with collaterals to mid LAD, mid 10% stenosis, EF 60% 06/14/11  . NASAL SINUS SURGERY    . SHOULDER ARTHROSCOPY  03/01/2011   Procedure: ARTHROSCOPY SHOULDER;  Surgeon: Ninetta Lights, MD;  Location: [facility];  Service: Orthopedics;  Laterality: Right;  arthroscopy shoulder decompression subacromial partial acromioplasty with coracoacromial release, distal claviculectomy, debridement of labrium  . SHOULDER ARTHROSCOPY  03/01/2011   Procedure: ARTHROSCOPY SHOULDER;  Surgeon: Ninetta Lights, MD;  Location: [facility];  Service: Orthopedics;  Laterality: Right;  arthroscopy shoulder decompression subacromial partial acromioplasty with coracoacromial release, distal claviculectomy, debridement of labrium  . TEE WITHOUT CARDIOVERSION N/A 03/19/2016   Procedure: TRANSESOPHAGEAL ECHOCARDIOGRAM (TEE);  Surgeon: Larey Dresser, MD;  Location: [facility];  Service: Cardiovascular;  Laterality: N/A;  . TOTAL KNEE ARTHROPLASTY Right 09/23/2013   Procedure: TOTAL KNEE ARTHROPLASTY;  Surgeon: Ninetta Lights, MD;  Location: [facility];  Service: Orthopedics;  Laterality: Right;  . TOTAL SHOULDER ARTHROPLASTY  06/28/2011   Procedure: TOTAL SHOULDER ARTHROPLASTY;  Surgeon: Ninetta Lights, MD;  Location: [facility];  Service: Orthopedics;  Laterality: Right;  . UVULOPALATOPHARYNGOPLASTY    . VIDEO ASSISTED THORACOSCOPY (VATS)/WEDGE RESECTION Right 06/02/2018   Procedure: VIDEO ASSISTED THORACOSCOPY (VATS)/WEDGE RESECTION with Lymph node disection and intercostal nerve block.;  Surgeon: [name], MD;  Location: [facility];  Service: Thoracic;  Laterality: Right;   Social History   Occupational History  . Occupation: retired  Tobacco Use  . Smoking status: Former Smoker    Packs/day: 2.00    Years: 30.00    Pack years: 60.00    Types: Cigarettes    Quit date: 02/27/1998    Years since  quitting: 21.1  . Smokeless tobacco: Never Used  Substance and Sexual Activity  . Alcohol use: Yes    Alcohol/week: 14.0 standard drinks    Types: 14 Standard drinks or equivalent per week    Comment: 2 drinks daily ([place])  . Drug use: No  . Sexual activity: Not on file

## 2019-04-27 ENCOUNTER — Ambulatory Visit: Payer: Medicare Other | Attending: Internal Medicine

## 2019-04-27 DIAGNOSIS — Z23 Encounter for immunization: Secondary | ICD-10-CM | POA: Insufficient documentation

## 2019-04-27 NOTE — Progress Notes (Signed)
   Covid-19 Vaccination Clinic  Name:  George Vega    MRN: [id number] DOB: 08/30/1940  04/27/2019  [name] was observed post Covid-19 immunization for 30 minutes based on pre-vaccination screening without incidence. He was provided with Vaccine Information Sheet and instruction to access the V-Safe system.   [name] was instructed to call 911 with any severe reactions post vaccine: Marland Kitchen Difficulty breathing  . Swelling of your face and throat  . A fast heartbeat  . A bad rash all over your body  . Dizziness and weakness    Immunizations Administered    Name Date Dose VIS Date Route   Pfizer COVID-19 Vaccine 04/27/2019 10:12 AM 0.3 mL 02/20/2019 Intramuscular   Manufacturer: [place]   Lot: [id number]   Guin: [id number]

## 2019-05-01 ENCOUNTER — Ambulatory Visit (INDEPENDENT_AMBULATORY_CARE_PROVIDER_SITE_OTHER): Payer: Medicare Other | Admitting: Orthopedic Surgery

## 2019-05-01 ENCOUNTER — Ambulatory Visit: Payer: Medicare Other | Admitting: Orthopedic Surgery

## 2019-05-01 ENCOUNTER — Other Ambulatory Visit: Payer: Self-pay

## 2019-05-01 DIAGNOSIS — M1712 Unilateral primary osteoarthritis, left knee: Secondary | ICD-10-CM

## 2019-05-04 IMAGING — DX PORTABLE CHEST - 1 VIEW
1 series · 1 of 1 positions shown · non-contrast
Comparison: 06/03/2018; 06/02/2018; 05/30/2018; 04/29/2018; chest
CT-04/09/2018

CLINICAL DATA: Post right-sided VATS resection.

EXAM:
PORTABLE CHEST 1 VIEW

[chest]
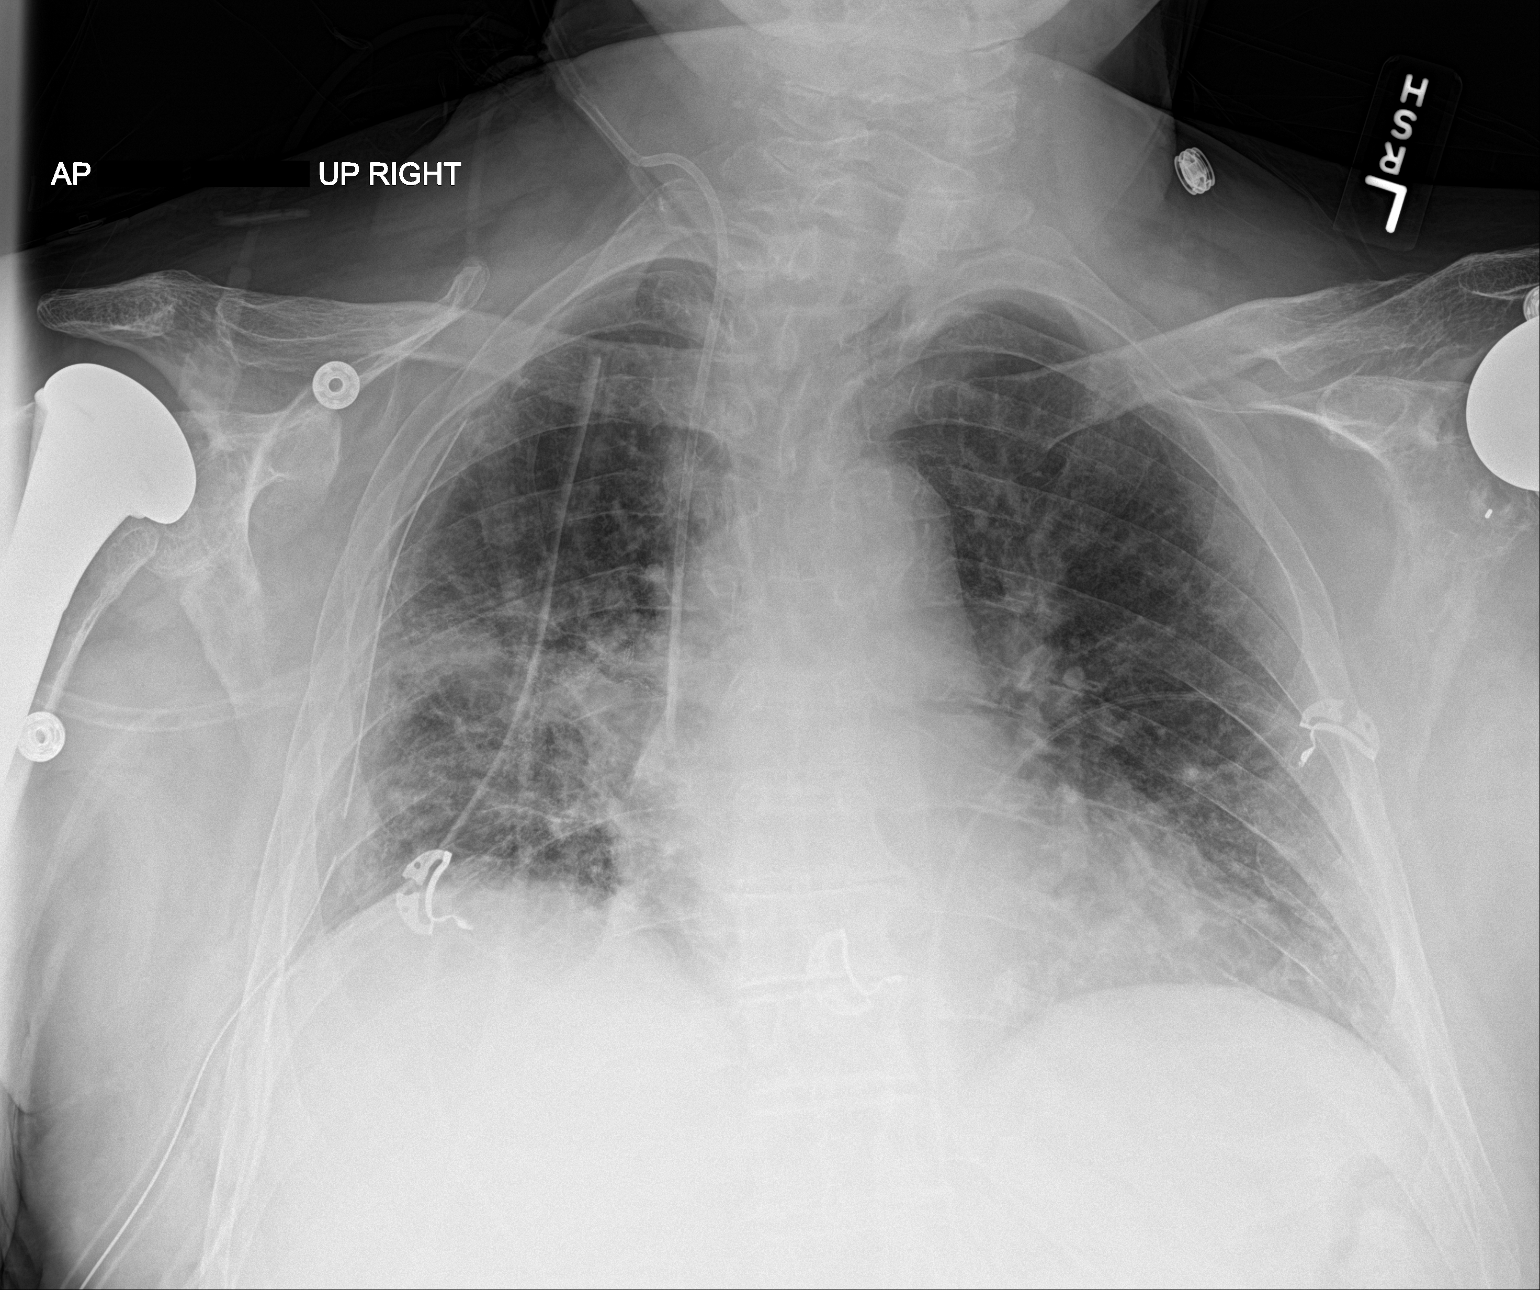

[1 of 1 positions shown; findings below may reference images not displayed]

FINDINGS: Grossly unchanged cardiac silhouette and mediastinal contours.
Stable postsurgical change of the right lung with associated volume
loss and perihilar heterogeneous opacities. Stable position of
support apparatus. No pneumothorax. Grossly unchanged left basilar
heterogeneous opacities favored to represent atelectasis. No pleural
effusion. No evidence of edema. Post bilateral hemi humeral
arthroplasty. No definite acute osseous abnormalities.
IMPRESSION: 1. Stable positioning of support apparatus.  No pneumothorax.
2. Stable postsurgical change of the right lung with grossly
unchanged perihilar and left basilar atelectasis.

## 2019-05-05 ENCOUNTER — Encounter: Payer: Self-pay | Admitting: Orthopedic Surgery

## 2019-05-05 ENCOUNTER — Other Ambulatory Visit: Payer: Self-pay

## 2019-05-05 ENCOUNTER — Ambulatory Visit (HOSPITAL_COMMUNITY)
Admission: RE | Admit: 2019-05-05 | Discharge: 2019-05-05 | Disposition: A | Discharge status: Home / Self Care | Payer: Medicare Other | Source: Ambulatory Visit | Attending: Interventional Radiology | Admitting: Interventional Radiology

## 2019-05-05 DIAGNOSIS — M1712 Unilateral primary osteoarthritis, left knee: Secondary | ICD-10-CM

## 2019-05-05 DIAGNOSIS — I771 Stricture of artery: Secondary | ICD-10-CM | POA: Diagnosis present

## 2019-05-05 MED ORDER — HYLAN G-F 20 48 MG/6ML IX SOSY
48.0000 mg | PREFILLED_SYRINGE | INTRA_ARTICULAR | Status: AC | PRN
  Administered 2019-05-05: 48 mg via INTRA_ARTICULAR

## 2019-05-05 MED ORDER — LIDOCAINE HCL 1 % IJ SOLN
5.0000 mL | INTRAMUSCULAR | Status: AC | PRN
  Administered 2019-05-05: 19:00:00 5 mL

## 2019-05-05 NOTE — Progress Notes (Signed)
Bilateral carotid artery duplex exam performed.  Preliminary results can be found under CV proc under chart review.  05/05/2019 10:10 AM  Barby Colvard, K., RDMS, RVT

## 2019-05-05 NOTE — Progress Notes (Signed)
   Procedure Note  Patient: ULISSES VONDRAK             Date of Birth: 12-Oct-1940           MRN: [id number]             Visit Date: 05/01/2019  Procedures: Visit Diagnoses:  1. Unilateral primary osteoarthritis, left knee     Large Joint Inj: L knee on 05/05/2019 7:02 PM Indications: pain, joint swelling and diagnostic evaluation Details: 18 G 1.5 in needle, superolateral approach  Arthrogram: No  Medications: 5 mL lidocaine 1 %; 48 mg Hylan 48 MG/6ML Outcome: tolerated well, no immediate complications Procedure, treatment alternatives, risks and benefits explained, specific risks discussed. Consent was given by the patient. Immediately prior to procedure a time out was called to verify the correct patient, procedure, equipment, support staff and site/side marked as required. Patient was prepped and draped in the usual sterile fashion.

## 2019-05-06 IMAGING — CR CHEST - 2 VIEW
2 series · 2 of 2 positions shown · non-contrast
Comparison: June 05, 2018 chest CT and PET-CT April 18, 2018

CLINICAL DATA: Chest tube removal. Recent video-assisted
thoracoscopy procedure

EXAM:
CHEST - 2 VIEW

[chest lat]
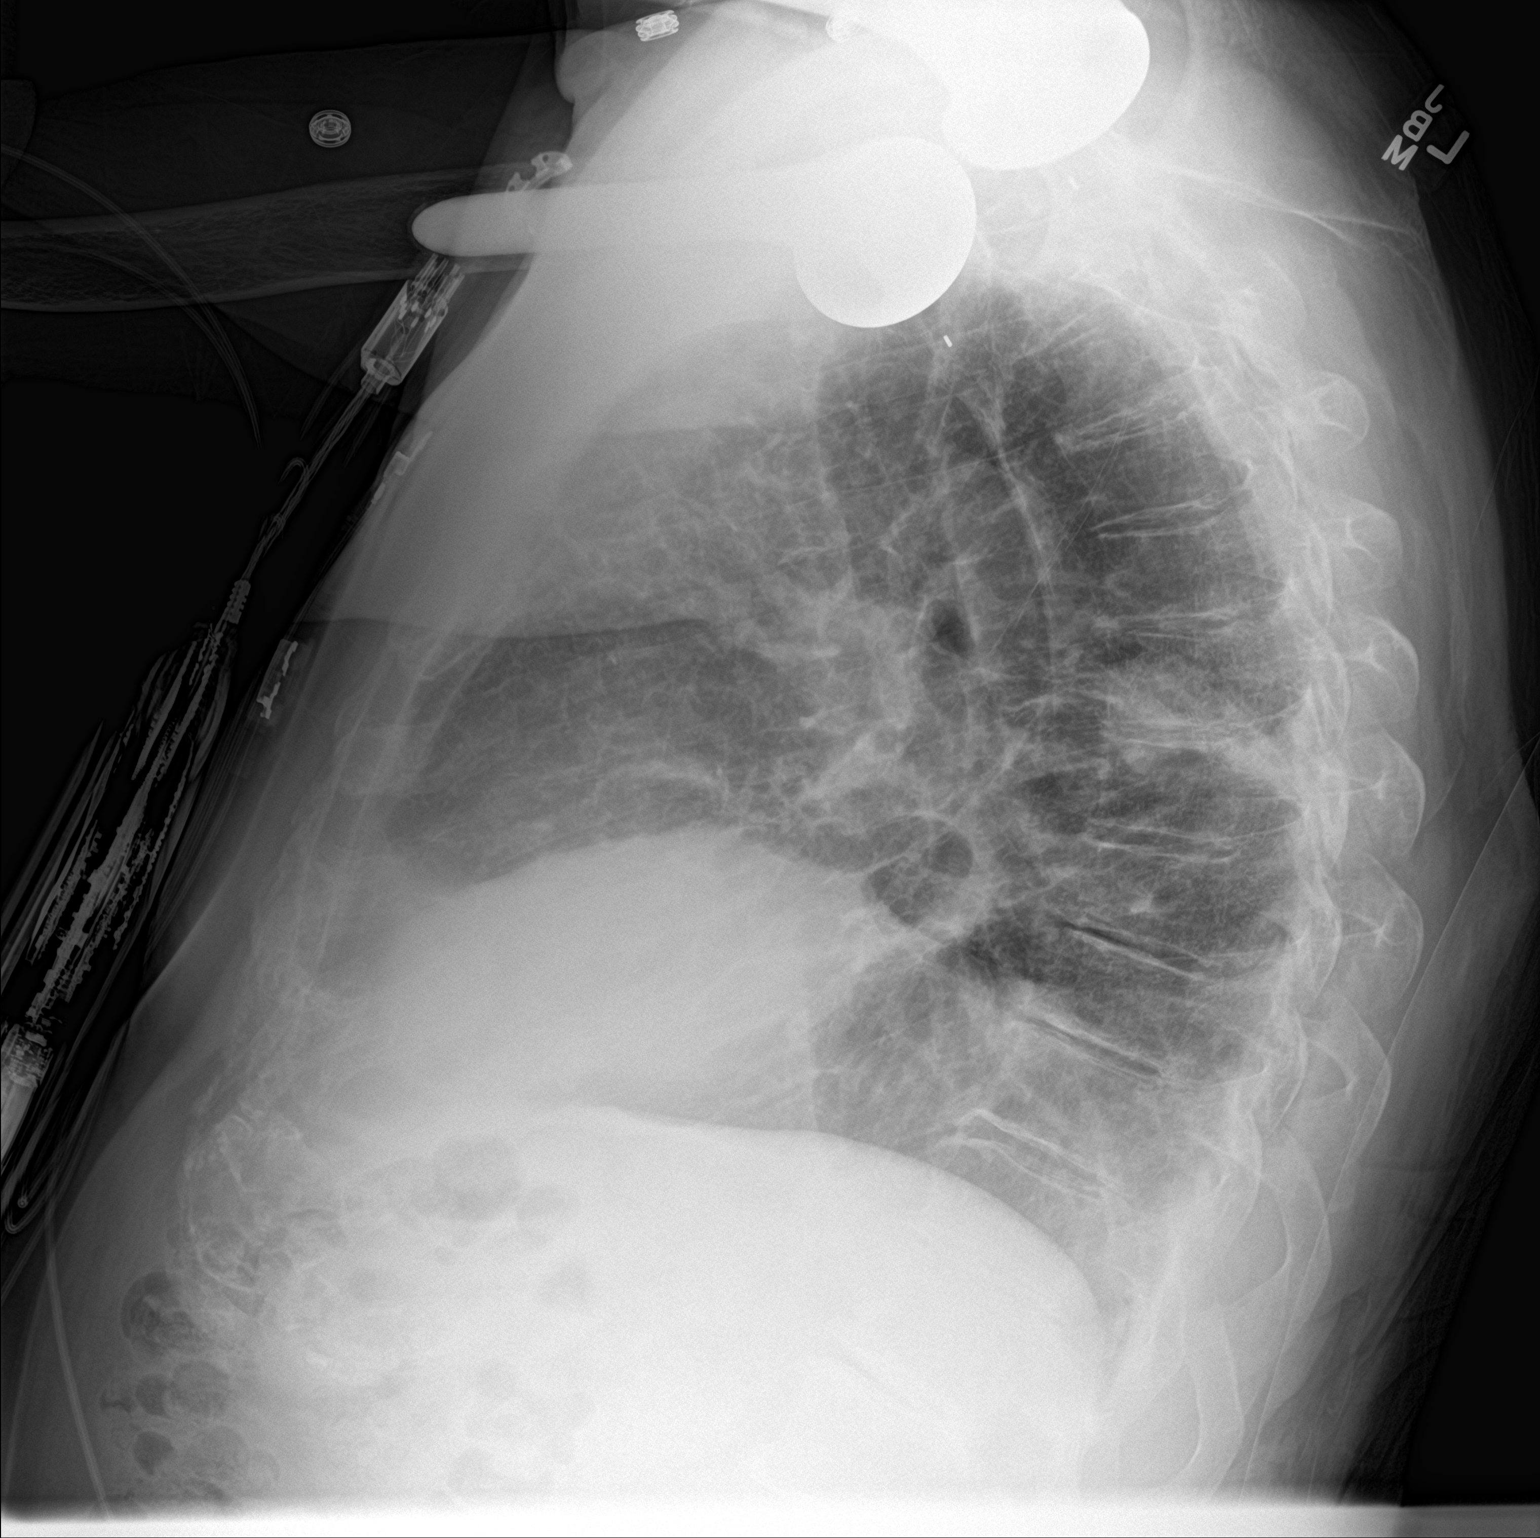

[chest ap]
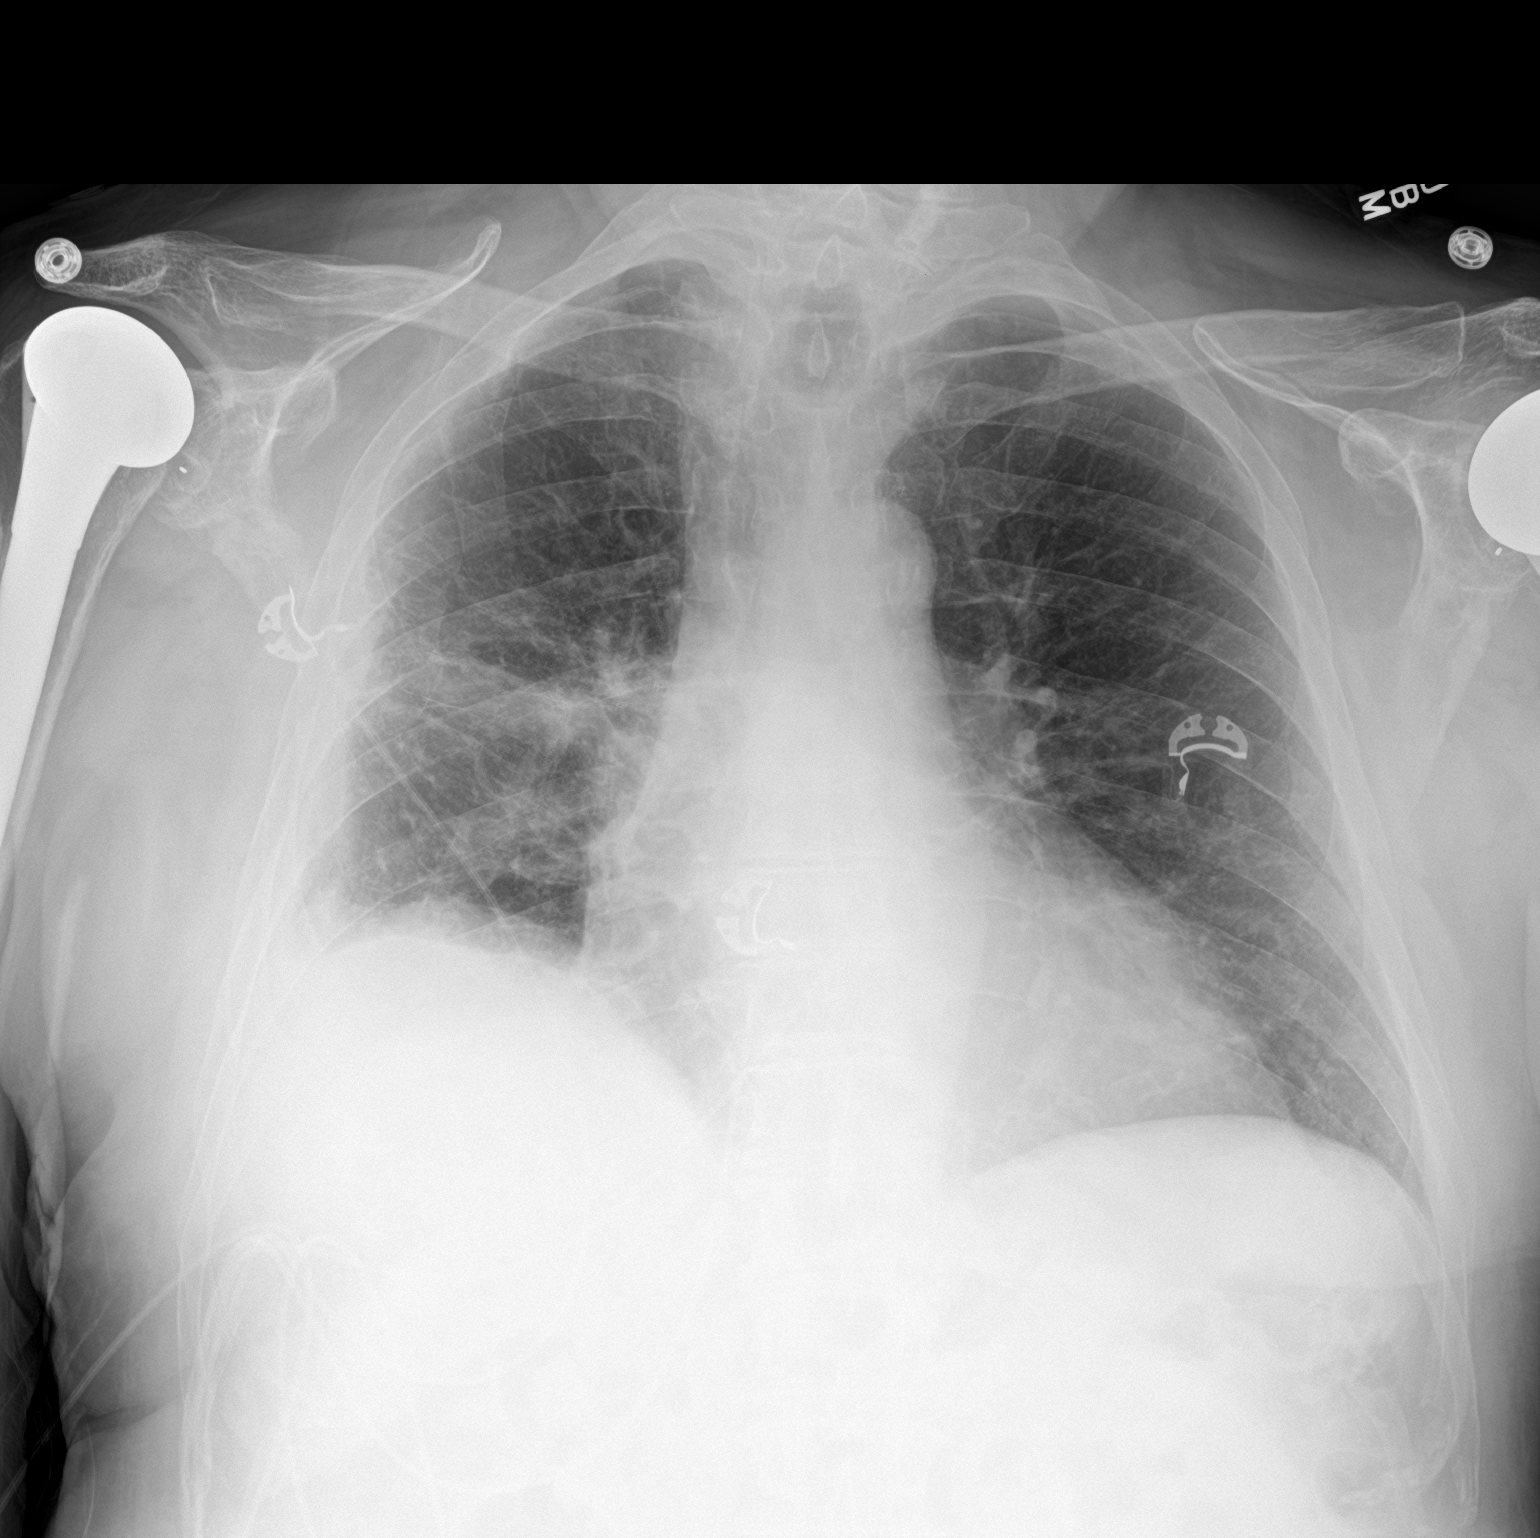

[2 of 2 positions shown; findings below may reference images not displayed]

FINDINGS: Right chest tube and central catheter have been removal. No
appreciable pneumothorax. There is a small right pleural effusion.
There is focal consolidation in the posterior right mid lung
region. No new opacity evident. Heart is upper normal in size with
pulmonary vascularity normal. No adenopathy. There are total
shoulder replacements bilaterally. Bones are diffusely osteoporotic.
Calcification is noted in the aortic arch and each carotid artery
region.
IMPRESSION: No pneumothorax. Central catheter on the right as well as right
chest tube have been removed. Consolidation posterior right mid lung
with small right pleural effusion noted. No well-defined mass seen
in this area currently. No frank consolidation a. Stable cardiac
prominence. Bones osteoporotic. There is aortic atherosclerosis as
well as bilateral carotid artery calcification.

## 2019-05-07 ENCOUNTER — Encounter (INDEPENDENT_AMBULATORY_CARE_PROVIDER_SITE_OTHER): Payer: Medicare Other | Admitting: Ophthalmology

## 2019-05-07 ENCOUNTER — Other Ambulatory Visit: Payer: Self-pay

## 2019-05-07 DIAGNOSIS — H33122 Parasitic cyst of retina, left eye: Secondary | ICD-10-CM | POA: Diagnosis not present

## 2019-05-07 DIAGNOSIS — H35033 Hypertensive retinopathy, bilateral: Secondary | ICD-10-CM

## 2019-05-07 DIAGNOSIS — H43813 Vitreous degeneration, bilateral: Secondary | ICD-10-CM

## 2019-05-07 DIAGNOSIS — H353211 Exudative age-related macular degeneration, right eye, with active choroidal neovascularization: Secondary | ICD-10-CM

## 2019-05-07 DIAGNOSIS — I10 Essential (primary) hypertension: Secondary | ICD-10-CM | POA: Diagnosis not present

## 2019-05-07 DIAGNOSIS — D3131 Benign neoplasm of right choroid: Secondary | ICD-10-CM

## 2019-05-09 IMAGING — CR CHEST - 2 VIEW
2 series · 2 of 2 positions shown · non-contrast
Comparison: 06/06/2018, 06/05/2018, 06/04/2018

CLINICAL DATA: 77-year-old male with a history of cough and
shortness of breath. VATS 06/02/2018 with wedge resection

EXAM:
CHEST - 2 VIEW

[w chest pa]
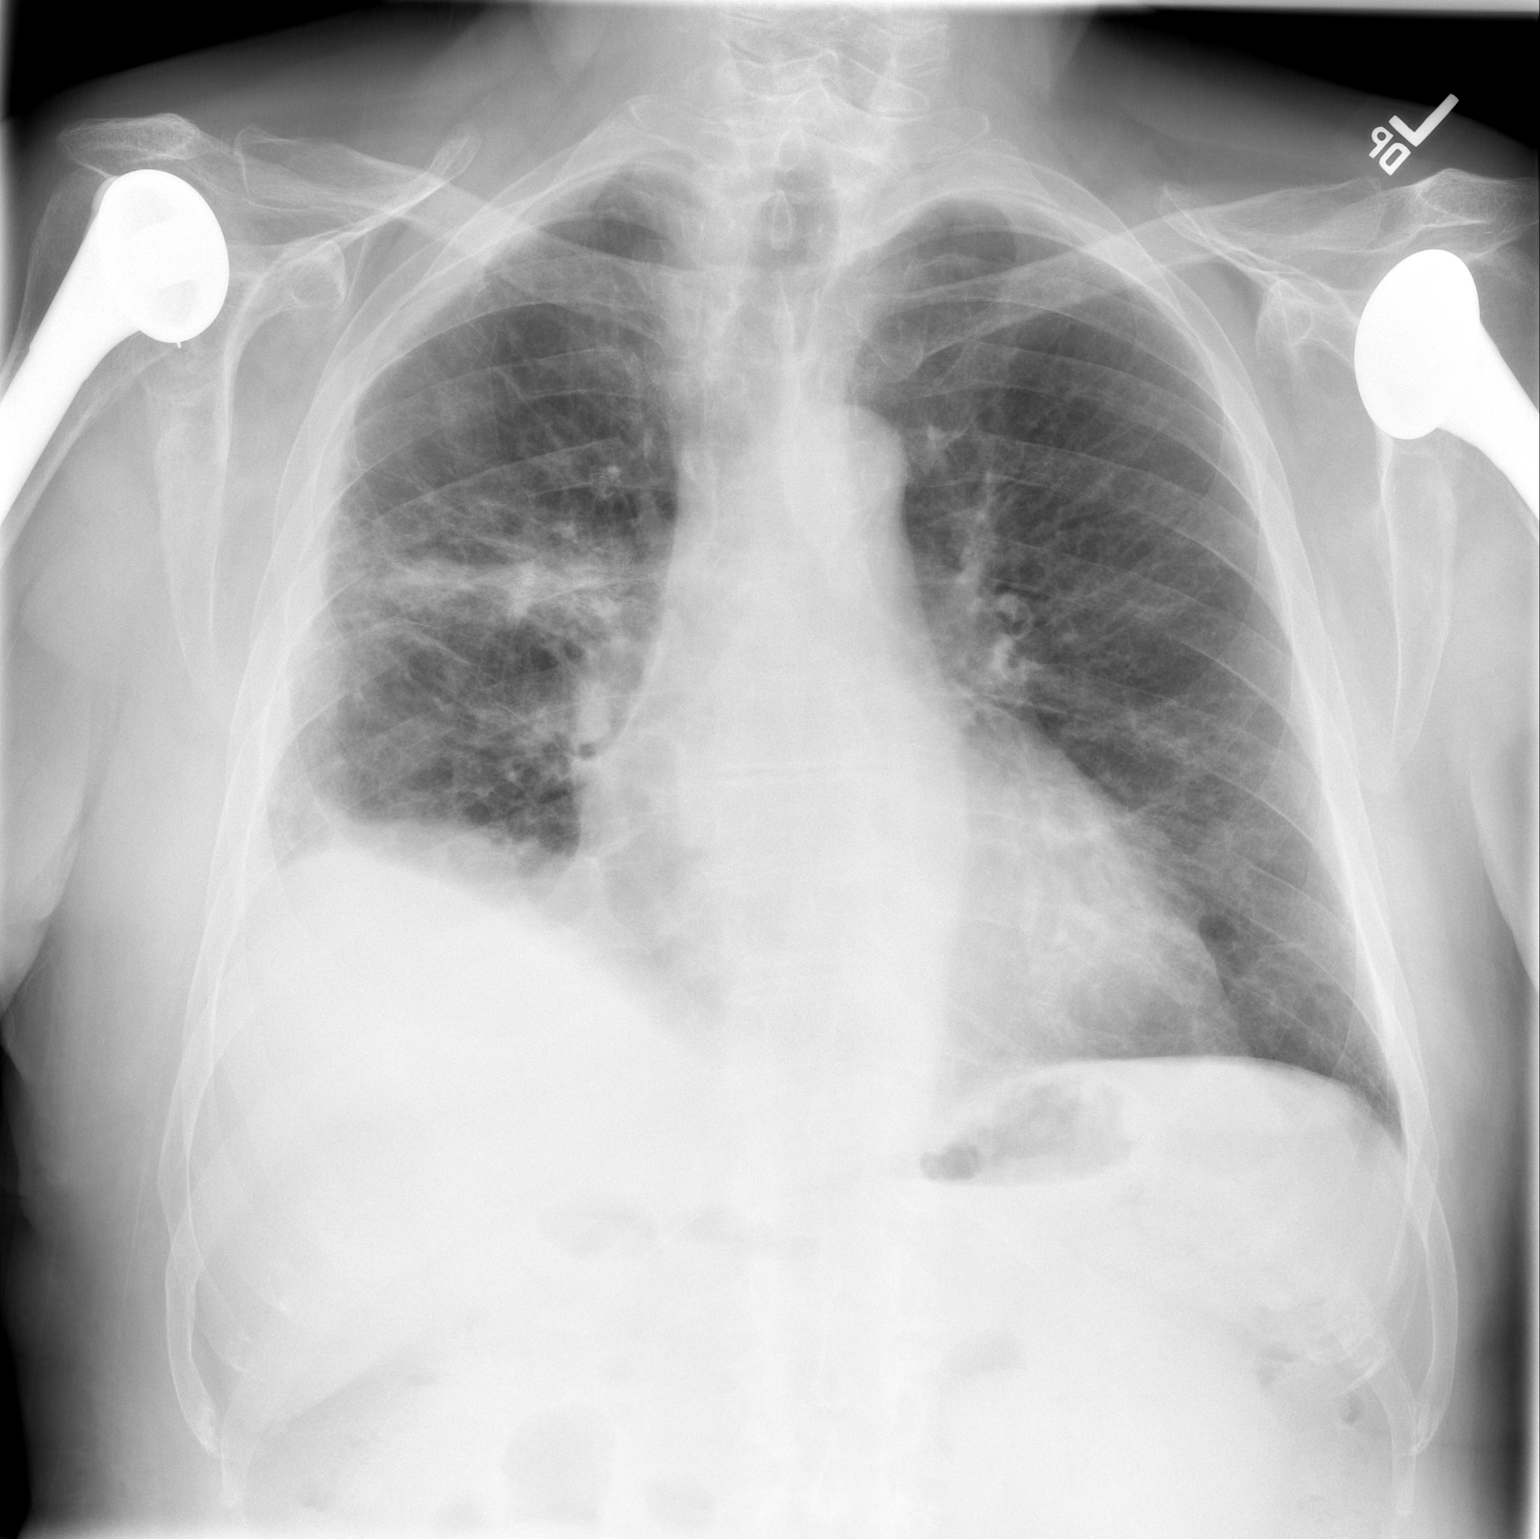

[w chest lat]
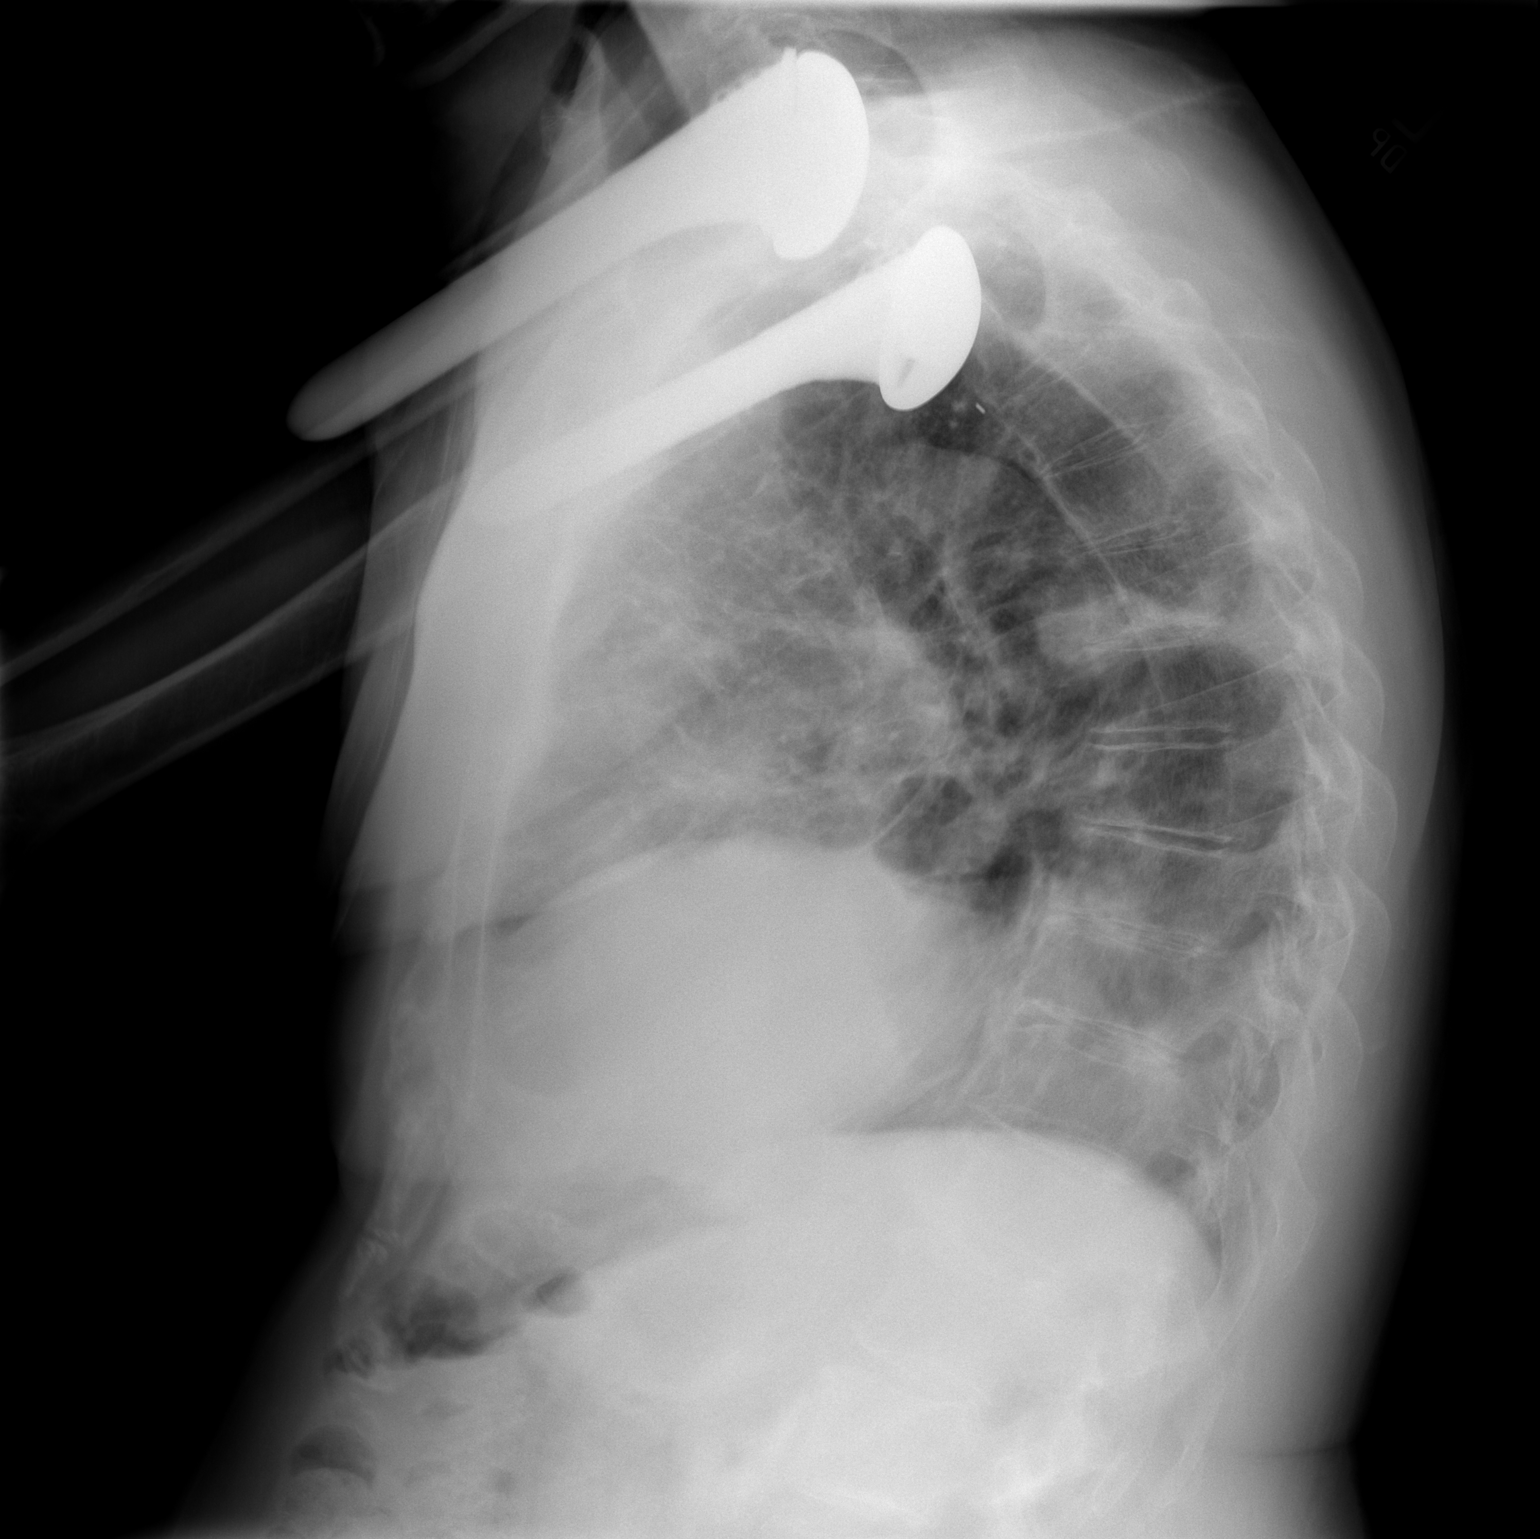

[2 of 2 positions shown; findings below may reference images not displayed]

FINDINGS: Cardiomediastinal silhouette unchanged in size and contour.

Similar appearance of reticulonodular opacities of the right hilar
region and mid lung as well as the right lung base with asymmetric
elevation the right hemidiaphragm. Pleuroparenchymal opacity on the
right lung periphery, unchanged.

Surgical changes of the bilateral shoulders.
IMPRESSION: Similar appearance of the chest x-ray, with no pneumothorax or new
pleural effusion.

Similar appearance of the right lung, likely with postsurgical
change/atelectasis in the mid lung and lung base.

## 2019-05-11 ENCOUNTER — Ambulatory Visit (INDEPENDENT_AMBULATORY_CARE_PROVIDER_SITE_OTHER): Payer: Medicare Other | Admitting: Cardiology

## 2019-05-11 ENCOUNTER — Encounter: Payer: Self-pay | Admitting: Cardiology

## 2019-05-11 ENCOUNTER — Other Ambulatory Visit: Payer: Self-pay

## 2019-05-11 ENCOUNTER — Encounter: Payer: Self-pay | Admitting: *Deleted

## 2019-05-11 VITALS — BP 114/78 | HR 85 | Ht 70.0 in | Wt 216.0 lb

## 2019-05-11 DIAGNOSIS — I480 Paroxysmal atrial fibrillation: Secondary | ICD-10-CM | POA: Diagnosis not present

## 2019-05-11 DIAGNOSIS — R002 Palpitations: Secondary | ICD-10-CM

## 2019-05-11 NOTE — Progress Notes (Signed)
Patient ID: George Vega, male   DOB: [birth year], 79 y.o.   MRN: [id number] Patient enrolled for [employer] to mail a 14 day ZIO XT long term holter monitor to his home.   Instructions included in kit and also sent to patient as My Chart message.

## 2019-05-11 NOTE — Progress Notes (Signed)
Electrophysiology Office Note   Date:  05/11/2019   ID:  George Vega, George Vega 10-29-1940, MRN [id number]  PCP:  [name], MD  Cardiologist:  Lovena Le Primary Electrophysiologist: Lovena Le    No chief complaint on file.    History of Present Illness: George Vega is a 79 y.o. male who is being seen today for the evaluation of atrial fibrillation at the request of Cristopher Peru. Presenting today for electrophysiology evaluation.  History of hypertension, PAF, acute on chronic diastolic heart failure, and history of GI bleeding.  He is currently anticoagulated and has had no additional bleeding.  He has been on dofetilide for approximately 1 year and had multiple episodes of atrial fibrillation in May.  He is now status post AF ablation 02/11/2019.  Today, denies symptoms of palpitations, chest pain, shortness of breath, orthopnea, PND, lower extremity edema, claudication, dizziness, presyncope, syncope, bleeding, or neurologic sequela. The patient is tolerating medications without difficulties.  Unfortunately, he has continued to have episodes of both tachycardia and bradycardia.  This is occurred since ablation.  He gets short of breath when these episodes occur.  He was readmitted to the hospital shortly after ablation due to volume overload.  He went in atrial flutter at that time.  Past Medical History:  Diagnosis Date  . Arthritis   . Atrial fibrillation, persistent ([place])   . Chronic sinusitis   . Coronary artery disease   . Depression   . Difficult intubation    was told with shoulder done 2006-alittle narrow  . 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 Hypertension   . Jejunostomy tube fell out    when asked about this in 04/2017, pt denied ever having a J tube, feeding tube, tubes placed post surgery so ??? veracity of a previous J tube.    . 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 Spastic colon   . 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 Stroke (cerebrum) ([place])   . SVT (supraventricular tachycardia) ([facility])    Past Surgical History:  Procedure Laterality Date  . ATRIAL FIBRILLATION ABLATION N/A 02/11/2019   Procedure: ATRIAL FIBRILLATION ABLATION;  Surgeon: Constance Haw, MD;  Location: [facility];  Service: Cardiovascular;  Laterality: N/A;  . CARDIAC CATHETERIZATION    . CATARACT EXTRACTION, BILATERAL    . COLONOSCOPY WITH PROPOFOL N/A 04/17/2017   Procedure: COLONOSCOPY WITH PROPOFOL;  Surgeon: Yetta Flock, MD;  Location: [facility];  Service: Gastroenterology;  Laterality: N/A;  . ESOPHAGEAL DILATION  2017  . ESOPHAGOGASTRODUODENOSCOPY (EGD) WITH PROPOFOL N/A 04/17/2017   Procedure: ESOPHAGOGASTRODUODENOSCOPY (EGD) WITH PROPOFOL;  Surgeon: Yetta Flock, MD;  Location: [facility];  Service: Gastroenterology;  Laterality: N/A;  . EYE SURGERY    . FOOT NEUROMA SURGERY  2002  . LEFT HEART CATH AND CORONARY ANGIOGRAPHY N/A 01/16/2019   Procedure: LEFT HEART CATH AND CORONARY ANGIOGRAPHY;  Surgeon: Jettie Booze, MD;  Location: [facility];  Service: Cardiovascular;  Laterality: N/A;  . LEFT HEART CATHETERIZATION WITH CORONARY ANGIOGRAM Right 06/14/2011   20% LM, chronic occluded mid LAD, 50% ostial LCX, 20% mid RI, RCA with collaterals to mid LAD, mid 10% stenosis, EF 60% 06/14/11  . NASAL SINUS SURGERY    . SHOULDER ARTHROSCOPY  03/01/2011   Procedure: ARTHROSCOPY SHOULDER;  Surgeon: Ninetta Lights, MD;  Location: [facility];  Service: Orthopedics;  Laterality: Right;  arthroscopy shoulder decompression subacromial partial acromioplasty with coracoacromial release, distal claviculectomy, debridement of labrium  . SHOULDER ARTHROSCOPY  03/01/2011   Procedure: ARTHROSCOPY SHOULDER;  Surgeon: Ninetta Lights, MD;  Location: [facility];  Service: Orthopedics;  Laterality: Right;  arthroscopy shoulder  decompression subacromial partial acromioplasty with coracoacromial release, distal claviculectomy, debridement of labrium  . TEE WITHOUT CARDIOVERSION N/A 03/19/2016   Procedure: TRANSESOPHAGEAL ECHOCARDIOGRAM (TEE);  Surgeon: Larey Dresser, MD;  Location: [facility];  Service: Cardiovascular;  Laterality: N/A;  . TOTAL KNEE ARTHROPLASTY Right 09/23/2013   Procedure: TOTAL KNEE ARTHROPLASTY;  Surgeon: Ninetta Lights, MD;  Location: [facility];  Service: Orthopedics;  Laterality: Right;  . TOTAL SHOULDER ARTHROPLASTY  06/28/2011   Procedure: TOTAL SHOULDER ARTHROPLASTY;  Surgeon: Ninetta Lights, MD;  Location: [facility];  Service: Orthopedics;  Laterality: Right;  . UVULOPALATOPHARYNGOPLASTY    . VIDEO ASSISTED THORACOSCOPY (VATS)/WEDGE RESECTION Right 06/02/2018   Procedure: VIDEO ASSISTED THORACOSCOPY (VATS)/WEDGE RESECTION with Lymph node disection and intercostal nerve block.;  Surgeon: [name], MD;  Location[facility];  Service: Thoracic;  Laterality: Right;     Current Outpatient Medications  Medication Sig Dispense Refill  . acetaminophen (TYLENOL) 325 MG tablet Take 650 mg by mouth every 6 (six) hours as needed for moderate pain.    Marland Kitchen apixaban (ELIQUIS) 5 MG TABS tablet Take 1 tablet (5 mg total) by mouth 2 (two) times daily. 180 tablet 1  . Cholecalciferol (VITAMIN D3) 5000 units TABS Take 5,000 Units by mouth daily.    Marland Kitchen dofetilide (TIKOSYN) 250 MCG capsule TAKE 1 CAPSULE BY MOUTH TWO TIMES DAILY 180 capsule 3  . ferrous sulfate (FEROSUL) 325 (65 FE) MG tablet Take 1 tablet (325 mg total) by mouth 2 (two) times daily with a meal.    . fluticasone (FLONASE) 50 MCG/ACT nasal spray Place 1-2 sprays into both nostrils daily as needed for allergies or rhinitis.    . furosemide (LASIX) 40 MG tablet Take 1 tablet (40 mg total) by mouth daily for 7 days, THEN 1 tablet (40 mg total) 3 (three) times a week for 23 days. 20 tablet 0  . irbesartan (AVAPRO) 150 MG tablet TAKE 1  TABLET(150 MG) BY MOUTH DAILY 90 tablet 3  . magnesium oxide (MAG-OX) 400 MG tablet Take 1 tablet (400 mg total) by mouth 2 (two) times daily. 60 tablet 0  . Melatonin 10 MG TABS Take 10 mg by mouth at bedtime as needed (sleep).     . metoprolol tartrate (LOPRESSOR) 25 MG tablet TAKE 1 TABLET BY MOUTH 2  TIMES DAILY. 180 tablet 2  . metoprolol tartrate (LOPRESSOR) 25 MG tablet Take 1-2 tablets (25-50 mg total) by mouth daily as needed (for breakthrough palpitations). 180 tablet 3  . Multiple Vitamins-Minerals (PRESERVISION AREDS 2 PO) Take 1 capsule by mouth 2 (two) times daily.    . pantoprazole (PROTONIX) 40 MG tablet Take 40 mg by mouth daily.     Marland Kitchen PARoxetine (PAXIL) 40 MG tablet Take 40 mg by mouth at bedtime.     . polycarbophil (FIBERCON) 625 MG tablet Take 1,250 mg by mouth at bedtime.     [name] Glycol-Propyl Glycol (SYSTANE OP) Place 2 drops into both eyes 2 (two) times daily as needed (dry eyes).     . potassium chloride 20 MEQ TBCR take 1 tab by mouth every day that you take lasix. 20 tablet 0  . rosuvastatin (  CRESTOR) 5 MG tablet TAKE 1 TABLET BY MOUTH  EVERY MORNING 90 tablet 3  . tamsulosin (FLOMAX) 0.4 MG CAPS capsule Take 0.4 mg by mouth at bedtime.   11   No current facility-administered medications for this visit.    Allergies:   Glucosamine-chondroitin, Tetanus toxoid, Fish oil, Ibuprofen, and Naproxen   Social History:  The patient  reports that he quit smoking about 21 years ago. His smoking use included cigarettes. He has a 60.00 pack-year smoking history. He has never used smokeless tobacco. He reports current alcohol use of about 14.0 standard drinks of alcohol per week. He reports that he does not use drugs.   Family History:  The patient's family history includes Cancer in his mother; Heart attack in his father; Migraines in his brother.    ROS:  Please see the history of present illness.   Otherwise, review of systems is positive for none.   All other systems  are reviewed and negative.   PHYSICAL EXAM: VS:  BP 114/78   Pulse 85   Ht 5\' 10"  (1.778 m)   Wt 216 lb (98 kg)   SpO2 93%   BMI 30.99 kg/m  , BMI Body mass index is 30.99 kg/m. GEN: Well nourished, well developed, in no acute distress  HEENT: normal  Neck: no JVD, carotid bruits, or masses Cardiac: RRR; no murmurs, rubs, or gallops,no edema  Respiratory:  clear to auscultation bilaterally, normal work of breathing GI: soft, nontender, nondistended, + BS MS: no deformity or atrophy  Skin: warm and dry Neuro:  Strength and sensation are intact Psych: euthymic mood, full affect  EKG:  EKG is ordered today. Personal review of the ekg ordered shows sinus rhythm, rate 85  Recent Labs: 02/14/2019: ALT 64; B Natriuretic Peptide 208.6 02/16/2019: Hemoglobin 10.7; Platelets 168 02/24/2019: BUN 18; Creatinine, Ser 1.05; Magnesium 2.1; Potassium 4.4; Sodium 139    Lipid Panel  No results found for: CHOL, TRIG, HDL, CHOLHDL, VLDL, LDLCALC, LDLDIRECT   Wt Readings from Last 3 Encounters:  05/11/19 216 lb (98 kg)  04/13/19 215 lb 6.4 oz (97.7 kg)  04/03/19 214 lb (97.1 kg)      Other studies Reviewed: Additional studies/ records that were reviewed today include: TTE 02/14/19 Review of the above records today demonstrates:  1. Left ventricular ejection fraction, by visual estimation, is 60 to  65%. The left ventricle has normal function. There is no left ventricular  hypertrophy.  2. Left ventricular diastolic parameters are consistent with Grade I  diastolic dysfunction (impaired relaxation).  3. Global right ventricle has normal systolic function.The right  ventricular size is normal. No increase in right ventricular wall  thickness.  4. Left atrial size was normal.  5. Right atrial size was normal.  6. The mitral valve is normal in structure. Mild mitral valve  regurgitation. No evidence of mitral stenosis.  7. The tricuspid valve is normal in structure. Tricuspid  valve  regurgitation is mild.  8. The aortic valve is normal in structure. Aortic valve regurgitation is  not visualized. No evidence of aortic valve sclerosis or stenosis.  9. The pulmonic valve was normal in structure. Pulmonic valve  regurgitation is not visualized.  10. Mildly elevated pulmonary artery systolic pressure.  11. The inferior vena cava is normal in size with greater than 50%  respiratory variability, suggesting right atrial pressure of 3 mmHg.   Cath 01/16/19  Ost Cx to Prox Cx lesion is 50% stenosed.  Mid LM to [facility]  LM lesion is 25% stenosed.  Mid LAD lesion is 100% stenosed.  LV end diastolic pressure is normal.  There is no aortic valve stenosis.  ASSESSMENT AND PLAN:  1.  Paroxysmal atrial fibrillation: Currently on Xarelto and dofetilide.  Status post AF ablation 02/11/2019.  CHA2DS2-VASc of 6.  Urgently he is continued to have episodes of tachycardia and bradycardia since ablation.  Due to that, we Javid Kemler place a 14-day monitor to further determine his arrhythmia burden.  2.  Hyperlipidemia: Continue statin  3.  GI bleeding: Currently on Xarelto.  No further GI bleeding.  4.  Diastolic heart failure: Maintaining a low-salt diet without evidence of volume overload    Current medicines are reviewed at length with the patient today.   The patient does not have concerns regarding his medicines.  The following changes were made today: none  Labs/ tests ordered today include:  Orders Placed This Encounter  Procedures  . LONG TERM MONITOR (3-14 DAYS)  . EKG 12-Lead    Disposition:   FU with Yerick Eggebrecht 3 months  Signed, Jaslynn Thome Meredith Leeds, MD  05/11/2019 1:32 PM     [place] [address] [phone] (office) [phone] (fax)

## 2019-05-11 NOTE — Telephone Encounter (Signed)
Fine with me to put off until he completes not only the study but gets the result and appropriate f/u for any findings and this may take a month or so > he can call us back when he reaches this point if addressing the pulse problem doesn't fix his symptoms

## 2019-05-11 NOTE — Patient Instructions (Signed)
Medication Instructions:  Your physician recommends that you continue on your current medications as directed. Please refer to the Current Medication list given to you today.  *If you need a refill on your cardiac medications before your next appointment, please call your pharmacy*   Lab Work: None ordered If you have labs (blood work) drawn today and your tests are completely normal, you will receive your results only by: Marland Kitchen MyChart Message (if you have MyChart) OR . A paper copy in the mail If you have any lab test that is abnormal or we need to change your treatment, we will call you to review the results.   Testing/Procedures: Your physician has recommended that you wear a 2 week holter monitor. Holter monitors are medical devices that record the heart's electrical activity. Doctors most often use these monitors to diagnose arrhythmias. Arrhythmias are problems with the speed or rhythm of the heartbeat. The monitor is a small, portable device. You can wear one while you do your normal daily activities. This is usually used to diagnose what is causing palpitations/syncope (passing out). This monitor will be mailed to you.   Follow-Up: At [facility], you and your health needs are our priority.  As part of our continuing mission to provide you with exceptional heart care, we have created designated Provider Care Teams.  These Care Teams include your primary Cardiologist (physician) and Advanced Practice Providers (APPs -  Physician Assistants and Nurse Practitioners) who all work together to provide you with the care you need, when you need it.   Your next appointment:   To be determined after monitor has been completed

## 2019-05-11 NOTE — Telephone Encounter (Signed)
PCCs please see new note.  MW does not want this scheduled at this time.  Will wait until after pt receives results from his stress test. Thanks!

## 2019-05-11 NOTE — Telephone Encounter (Signed)
MW: please see pt email:  Dear George Vega I wish to reschedule the pulmonary stress test that you have me scheduled for next week. I have been able to correlate the wide swings in pulse rate with the shortness of breath and will now be wearing a heart monitor for the next several weeks to determine what is going on with heart first and that may fix the breath symptom. Thanks, George Vega  PCCs please advise if you can reschedule this stress test with this patient.  He needs to have this moved until after he is done wearing a heart monitor (per pt request)

## 2019-05-12 NOTE — Telephone Encounter (Signed)
I have called [facility] and canceled cpx.  Nothing further needed.

## 2019-05-15 ENCOUNTER — Other Ambulatory Visit (HOSPITAL_COMMUNITY): Payer: Medicare Other

## 2019-05-17 ENCOUNTER — Ambulatory Visit (INDEPENDENT_AMBULATORY_CARE_PROVIDER_SITE_OTHER): Payer: Medicare Other

## 2019-05-17 DIAGNOSIS — R002 Palpitations: Secondary | ICD-10-CM | POA: Diagnosis not present

## 2019-05-18 ENCOUNTER — Encounter (HOSPITAL_COMMUNITY): Payer: Medicare Other

## 2019-05-27 MED ORDER — METOPROLOL TARTRATE 25 MG PO TABS: 25.0000 mg | ORAL_TABLET | Freq: Two times a day (BID) | ORAL | 3 refills | Status: DC

## 2019-05-27 MED ORDER — DOFETILIDE 250 MCG PO CAPS: ORAL_CAPSULE | ORAL | 3 refills | Status: DC

## 2019-06-12 ENCOUNTER — Telehealth: Payer: Self-pay | Admitting: Cardiology

## 2019-06-12 NOTE — Telephone Encounter (Signed)
Followed up pt triggered events and what he was experiencing at the time he "triggered" them. Pt reports light headedness/dizziness (what he feels when he experiences tachycardia), sob that is somewhat improved and can feel activity in his chest but "can't really define it". Denies CP, edema, weight gain, syncope. Will review w/ Dr. Curt Bears.  Pt aware office will call once monitor reports reviewed by [place]. Patient verbalized understanding and agreeable to plan.

## 2019-06-12 NOTE — Telephone Encounter (Signed)
New Message  George Vega from [place] is calling in with abnormal zio results. Transferred call to triage, was told by Arbie Cookey to send a message for a call back. George Vega can be reached at  [phone], reference # [id number].

## 2019-06-12 NOTE — Telephone Encounter (Signed)
Spoke with [place] at [employer] who was calling to report that the patient had symptomatic bradycardia for >30 seconds with an average heart rate of 38 bpm. Atrial flutter was also seen with longest period being 1 hour and 29 minutes. Patient also triggered symptomatic SVT event with average heart rate of 148 bpm.   Called the patient to see what symptoms he was having. Spoke with the patient's wife who states that the patient is asleep. He will call back when he wakes up.

## 2019-06-18 NOTE — Telephone Encounter (Signed)
See monitor result for further documentation of plan

## 2019-06-23 ENCOUNTER — Other Ambulatory Visit: Payer: Self-pay | Admitting: Urology

## 2019-06-23 DIAGNOSIS — R972 Elevated prostate specific antigen [PSA]: Secondary | ICD-10-CM

## 2019-06-23 NOTE — Telephone Encounter (Signed)
Pt scheduled to see Dr. Curt Bears on Thursday to discuss continued SOB, dizziness, no energy, high HRs, low DBP and possible repeat ablation. Patient verbalized understanding and agreeable to plan.

## 2019-06-25 ENCOUNTER — Encounter: Payer: Self-pay | Admitting: Cardiology

## 2019-06-25 ENCOUNTER — Other Ambulatory Visit: Payer: Self-pay

## 2019-06-25 ENCOUNTER — Ambulatory Visit (INDEPENDENT_AMBULATORY_CARE_PROVIDER_SITE_OTHER): Payer: Medicare Other | Admitting: Cardiology

## 2019-06-25 ENCOUNTER — Other Ambulatory Visit: Payer: Self-pay | Admitting: Cardiology

## 2019-06-25 VITALS — HR 145 | Ht 70.0 in | Wt 213.0 lb

## 2019-06-25 DIAGNOSIS — I4819 Other persistent atrial fibrillation: Secondary | ICD-10-CM | POA: Diagnosis not present

## 2019-06-25 MED ORDER — AMIODARONE HCL 200 MG PO TABS: 200.0000 mg | ORAL_TABLET | Freq: Every day | ORAL | 3 refills | Status: DC

## 2019-06-25 MED ORDER — AMIODARONE HCL 200 MG PO TABS: 200.0000 mg | ORAL_TABLET | Freq: Every day | ORAL | 0 refills | Status: DC

## 2019-06-25 NOTE — Patient Instructions (Addendum)
Medication Instructions:  Your physician has recommended you make the following change in your medication:  1. STOP Tikosyn 2. START Amiodarone  - take 2 tablets (400 mg total) TWICE a day for 2 weeks, then  - take 1 tablet (200 mg total) TWICE a day for 2 weeks, then  - take 1 tablet (200 mg total) ONCE a day   *If you need a refill on your cardiac medications before your next appointment, please call your pharmacy*   Lab Work: None ordered If you have labs (blood work) drawn today and your tests are completely normal, you will receive your results only by: Marland Kitchen MyChart Message (if you have MyChart) OR . A paper copy in the mail If you have any lab test that is abnormal or we need to change your treatment, we will call you to review the results.   Testing/Procedures: Your physician has recommended that you have a Cardioversion (DCCV). Electrical Cardioversion uses a jolt of electricity to your heart either through paddles or wired patches attached to your chest. This is a controlled, usually prescheduled, procedure. Defibrillation is done under light anesthesia in the hospital, and you usually go home the day of the procedure. This is done to get your heart back into a normal rhythm. You are not awake for the procedure.   INSTRUCTIONS              COVID TEST-- On 06/30/2019@ 9:00 am - You will go to [facility] hospital ([place]) for your Covid testing.   This is a drive thru test site.  There will be multiple testing areas.  Be sure to share with the first checkpoint that you are there for pre-procedure/surgery testing. This will put you into the right (yellow) lane that leads to the PAT testing team. Stay in your car and the nurse team will come to your car to test you.  After you are tested please go home and self quarantine until the day of your procedure.       Please arrive at the [facility] main entrance of [facility] hospital on 07/02/2019 at  7:00 am Do not  eat or drink after midnight prior to procedure Hold your morning medications. You will need someone to drive you home at discharge      Follow-Up: At [facility], you and your health needs are our priority.  As part of our continuing mission to provide you with exceptional heart care, we have created designated Provider Care Teams.  These Care Teams include your primary Cardiologist (physician) and Advanced Practice Providers (APPs -  Physician Assistants and Nurse Practitioners) who all work together to provide you with the care you need, when you need it.  We recommend signing up for the patient portal called "MyChart".  Sign up information is provided on this After Visit Summary.  MyChart is used to connect with patients for Virtual Visits (Telemedicine).  Patients are able to view lab/test results, encounter notes, upcoming appointments, etc.  Non-urgent messages can be sent to your provider as well.   To learn more about what you can do with MyChart, go to [email or web address].    Your next appointment:   2 week(s) to 3 weeks  The format for your next appointment:   In Person  Provider:   [facility] clinic   Thank you for choosing [facility]!!   Trinidad Curet, RN [phone]    Other Instructions

## 2019-06-25 NOTE — Progress Notes (Signed)
Electrophysiology Office Note   Date:  06/26/2019   ID:  George Vega, DOB 12-27-1940, MRN [id number]  PCP:  [name], MD  Cardiologist:  Lovena Le Primary Electrophysiologist: Lovena Le    No chief complaint on file.    History of Present Illness: George Vega is a 79 y.o. male who is being seen today for the evaluation of atrial fibrillation at the request of George Vega. Presenting today for electrophysiology evaluation.  History of hypertension, PAF, acute on chronic diastolic heart failure, and history of GI bleeding.  He is currently anticoagulated and has had no additional bleeding.  He has been on dofetilide for approximately 1 year and had multiple episodes of atrial fibrillation in May.  He is now status post AF ablation 02/11/2019.  Today, denies symptoms of palpitations, chest pain,  orthopnea, PND, lower extremity edema, claudication, dizziness, presyncope, syncope, bleeding, or neurologic sequela. The patient is tolerating medications without difficulties.  He is currently feeling poorly.  He has weakness, fatigue, and shortness of breath.  He presents to clinic today in atrial flutter.  He has had multiple arrhythmias since his ablation.  Unfortunately his right pulmonary veins were not able to be isolated.  Past Medical History:  Diagnosis Date  . Arthritis   . Atrial fibrillation, persistent ([place])   . Chronic sinusitis   . Coronary artery disease   . Depression   . Difficult intubation    was told with shoulder done 2006-alittle narrow  . 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 Hypertension   . Jejunostomy tube fell out    when asked about this in 04/2017, pt denied ever having a J tube, feeding tube, tubes placed post surgery so ??? veracity of a previous J tube.    . 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 Spastic colon   . Squamous cell carcinoma of lung, stage I, right ([place]) 05/07/2018   bx 04/29/18; isolated PET uptake in RLL mass  . Stroke  (cerebrum) ([place])   . SVT (supraventricular tachycardia) ([facility])    Past Surgical History:  Procedure Laterality Date  . ATRIAL FIBRILLATION ABLATION N/A 02/11/2019   Procedure: ATRIAL FIBRILLATION ABLATION;  Surgeon: Constance Haw, MD;  Location: [facility];  Service: Cardiovascular;  Laterality: N/A;  . CARDIAC CATHETERIZATION    . CATARACT EXTRACTION, BILATERAL    . COLONOSCOPY WITH PROPOFOL N/A 04/17/2017   Procedure: COLONOSCOPY WITH PROPOFOL;  Surgeon: Yetta Flock, MD;  Location: [place] [facility];  Service: Gastroenterology;  Laterality: N/A;  . ESOPHAGEAL DILATION  2017  . ESOPHAGOGASTRODUODENOSCOPY (EGD) WITH PROPOFOL N/A 04/17/2017   Procedure: ESOPHAGOGASTRODUODENOSCOPY (EGD) WITH PROPOFOL;  Surgeon: Yetta Flock, MD;  Location: [facility];  Service: Gastroenterology;  Laterality: N/A;  . EYE SURGERY    . FOOT NEUROMA SURGERY  2002  . LEFT HEART CATH AND CORONARY ANGIOGRAPHY N/A 01/16/2019   Procedure: LEFT HEART CATH AND CORONARY ANGIOGRAPHY;  Surgeon: Jettie Booze, MD;  Location: [facility];  Service: Cardiovascular;  Laterality: N/A;  . LEFT HEART CATHETERIZATION WITH CORONARY ANGIOGRAM Right 06/14/2011   20% LM, chronic occluded mid LAD, 50% ostial LCX, 20% mid RI, RCA with collaterals to mid LAD, mid 10% stenosis, EF 60% 06/14/11  . NASAL SINUS SURGERY    . SHOULDER ARTHROSCOPY  03/01/2011   Procedure: ARTHROSCOPY SHOULDER;  Surgeon: Ninetta Lights, MD;  Location: [facility];  Service: Orthopedics;  Laterality: Right;  arthroscopy shoulder decompression subacromial  partial acromioplasty with coracoacromial release, distal claviculectomy, debridement of labrium  . SHOULDER ARTHROSCOPY  03/01/2011   Procedure: ARTHROSCOPY SHOULDER;  Surgeon: Ninetta Lights, MD;  Location: [place];  Service: Orthopedics;  Laterality: Right;  arthroscopy shoulder decompression subacromial partial acromioplasty with coracoacromial  release, distal claviculectomy, debridement of labrium  . TEE WITHOUT CARDIOVERSION N/A 03/19/2016   Procedure: TRANSESOPHAGEAL ECHOCARDIOGRAM (TEE);  Surgeon: Larey Dresser, MD;  Location: [place];  Service: Cardiovascular;  Laterality: N/A;  . TOTAL KNEE ARTHROPLASTY Right 09/23/2013   Procedure: TOTAL KNEE ARTHROPLASTY;  Surgeon: Ninetta Lights, MD;  Location: [facility];  Service: Orthopedics;  Laterality: Right;  . TOTAL SHOULDER ARTHROPLASTY  06/28/2011   Procedure: TOTAL SHOULDER ARTHROPLASTY;  Surgeon: Ninetta Lights, MD;  Location: [facility];  Service: Orthopedics;  Laterality: Right;  . UVULOPALATOPHARYNGOPLASTY    . VIDEO ASSISTED THORACOSCOPY (VATS)/WEDGE RESECTION Right 06/02/2018   Procedure: VIDEO ASSISTED THORACOSCOPY (VATS)/WEDGE RESECTION with Lymph node disection and intercostal nerve block.;  Surgeon: [name], MD;  Location: [facility];  Service: Thoracic;  Laterality: Right;     Current Outpatient Medications  Medication Sig Dispense Refill  . acetaminophen (TYLENOL) 325 MG tablet Take 650 mg by mouth every 6 (six) hours as needed for moderate pain.    Marland Kitchen apixaban (ELIQUIS) 5 MG TABS tablet Take 1 tablet (5 mg total) by mouth 2 (two) times daily. 180 tablet 1  . Cholecalciferol (VITAMIN D3) 5000 units TABS Take 5,000 Units by mouth daily.    . ferrous sulfate (FEROSUL) 325 (65 FE) MG tablet Take 1 tablet (325 mg total) by mouth 2 (two) times daily with a meal.    . fluticasone (FLONASE) 50 MCG/ACT nasal spray Place 1-2 sprays into both nostrils daily as needed for allergies or rhinitis.    . furosemide (LASIX) 40 MG tablet Take 40 mg by mouth 3 (three) times a week.    . irbesartan (AVAPRO) 150 MG tablet TAKE 1 TABLET(150 MG) BY MOUTH DAILY 90 tablet 3  . magnesium oxide (MAG-OX) 400 MG tablet Take 1 tablet (400 mg total) by mouth 2 (two) times daily. 60 tablet 0  . Melatonin 10 MG TABS Take 10 mg by mouth at bedtime as needed (sleep).     . metoprolol  tartrate (LOPRESSOR) 25 MG tablet Take 1 tablet (25 mg total) by mouth 2 (two) times daily. 180 tablet 3  . Multiple Vitamins-Minerals (PRESERVISION AREDS 2 PO) Take 1 capsule by mouth 2 (two) times daily.    . pantoprazole (PROTONIX) 40 MG tablet Take 40 mg by mouth daily.     Marland Kitchen PARoxetine (PAXIL) 40 MG tablet Take 40 mg by mouth at bedtime.     . polycarbophil (FIBERCON) 625 MG tablet Take 1,250 mg by mouth at bedtime.     George Vega (SYSTANE OP) Place 2 drops into both eyes 2 (two) times daily as needed (dry eyes).     . potassium chloride 20 MEQ TBCR take 1 tab by mouth every day that you take lasix. 20 tablet 0  . rosuvastatin (CRESTOR) 5 MG tablet TAKE 1 TABLET BY MOUTH  EVERY MORNING 90 tablet 3  . tamsulosin (FLOMAX) 0.4 MG CAPS capsule Take 0.4 mg by mouth at bedtime.   11  . amiodarone (PACERONE) 200 MG tablet Take 1 tablet (200 mg total) by mouth daily. 84 tablet 0  . amiodarone (PACERONE) 200 MG tablet Take 1 tablet (200 mg total) by mouth  daily. 30 tablet 3   No current facility-administered medications for this visit.    Allergies:   Glucosamine-chondroitin, Tetanus toxoid, Fish oil, Ibuprofen, and Naproxen   Social History:  The patient  reports that he quit smoking about 21 years ago. His smoking use included cigarettes. He has a 60.00 pack-year smoking history. He has never used smokeless tobacco. He reports current alcohol use of about 14.0 standard drinks of alcohol per week. He reports that he does not use drugs.   Family History:  The patient's family history includes Cancer in his mother; Heart attack in his father; Migraines in his brother.    ROS:  Please see the history of present illness.   Otherwise, review of systems is positive for none.   All other systems are reviewed and negative.   PHYSICAL EXAM: VS:  Pulse (!) 145   Ht 5\' 10"  (1.778 m)   Wt 213 lb (96.6 kg)   SpO2 95%   BMI 30.56 kg/m  , BMI Body mass index is 30.56 kg/m. GEN:  Well nourished, well developed, in no acute distress  HEENT: normal  Neck: no JVD, carotid bruits, or masses Cardiac: Tachycardic, regular; no murmurs, rubs, or gallops,no edema  Respiratory:  clear to auscultation bilaterally, normal work of breathing GI: soft, nontender, nondistended, + BS MS: no deformity or atrophy  Skin: warm and dry Neuro:  Strength and sensation are intact Psych: euthymic mood, full affect  EKG:  EKG is ordered today. Personal review of the ekg ordered shows atrial flutter, rate 145  Recent Labs: 02/14/2019: ALT 64; B Natriuretic Peptide 208.6 02/16/2019: Hemoglobin 10.7; Platelets 168 02/24/2019: BUN 18; Creatinine, Ser 1.05; Magnesium 2.1; Potassium 4.4; Sodium 139    Lipid Panel  No results found for: CHOL, TRIG, HDL, CHOLHDL, VLDL, LDLCALC, LDLDIRECT   Wt Readings from Last 3 Encounters:  06/25/19 213 lb (96.6 kg)  05/11/19 216 lb (98 kg)  04/13/19 215 lb 6.4 oz (97.7 kg)      Other studies Reviewed: Additional studies/ records that were reviewed today include: TTE 02/14/19 Review of the above records today demonstrates:  1. Left ventricular ejection fraction, by visual estimation, is 60 to  65%. The left ventricle has normal function. There is no left ventricular  hypertrophy.  2. Left ventricular diastolic parameters are consistent with Grade I  diastolic dysfunction (impaired relaxation).  3. Global right ventricle has normal systolic function.The right  ventricular size is normal. No increase in right ventricular wall  thickness.  4. Left atrial size was normal.  5. Right atrial size was normal.  6. The mitral valve is normal in structure. Mild mitral valve  regurgitation. No evidence of mitral stenosis.  7. The tricuspid valve is normal in structure. Tricuspid valve  regurgitation is mild.  8. The aortic valve is normal in structure. Aortic valve regurgitation is  not visualized. No evidence of aortic valve sclerosis or stenosis.    9. The pulmonic valve was normal in structure. Pulmonic valve  regurgitation is not visualized.  10. Mildly elevated pulmonary artery systolic pressure.  11. The inferior vena cava is normal in size with greater than 50%  respiratory variability, suggesting right atrial pressure of 3 mmHg.   Cath 01/16/19  Ost Cx to Prox Cx lesion is 50% stenosed.  Mid LM to Dist LM lesion is 25% stenosed.  Mid LAD lesion is 100% stenosed.  LV end diastolic pressure is normal.  There is no aortic valve stenosis.  Cardiac monitor for  221 personally reviewed Max 174 bpm 11:32pm, 03/10 Min 34 bpm 09:54am, 03/14 Avg 76 bpm 1.9% PACs, less than 1% PVCs SVT occurred which could possibly be either atrial flutter or atrial tachycardia.  This occurred 6% of the time.   Triggered events associated with sinus rhythm, SVT, PACs  ASSESSMENT AND PLAN:  1.  Paroxysmal atrial fibrillation/atrial flutter: Currently on Xarelto and dofetilide.  Status post AF ablation 02/11/2019.  CHA2DS2-VASc of 6.  Unfortunately he is in atrial flutter today.  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  At this point, he would like to try to avoid ablation.  We Symir Mah thus load him on amiodarone and plan for cardioversion.  2.  Hyperlipidemia: Continue statin  3.  GI bleeding: Currently on Xarelto without further bleeding.  4.  Diastolic heart failure: Maintaining a low-salt diet without volume overload.    Current medicines are reviewed at length with the patient today.   The patient does not have concerns regarding his medicines.  The following changes were made today: Stop dofetilide, start amiodarone  Labs/ tests ordered today include:  Orders Placed This Encounter  Procedures  . EKG 12-Lead    Disposition:   FU with George Vega 3 months  Signed, George Vega Meredith Leeds, MD  06/26/2019 7:11 AM     [facility] [address] [place] [address] [phone] (office) [phone] (fax)

## 2019-06-30 ENCOUNTER — Inpatient Hospital Stay (HOSPITAL_COMMUNITY): Admission: RE | Admit: 2019-06-30 | Payer: Medicare Other | Source: Ambulatory Visit

## 2019-07-02 ENCOUNTER — Ambulatory Visit (HOSPITAL_COMMUNITY): Admission: RE | Admit: 2019-07-02 | Payer: Medicare Other | Source: Home / Self Care | Admitting: Cardiology

## 2019-07-02 ENCOUNTER — Encounter (HOSPITAL_COMMUNITY): Admission: RE | Payer: Self-pay | Source: Home / Self Care

## 2019-07-02 SURGERY — CARDIOVERSION
Anesthesia: General

## 2019-07-06 ENCOUNTER — Encounter: Payer: Self-pay | Admitting: Internal Medicine

## 2019-07-06 ENCOUNTER — Encounter (HOSPITAL_COMMUNITY): Payer: Self-pay

## 2019-07-06 ENCOUNTER — Inpatient Hospital Stay: Payer: Medicare Other | Attending: Internal Medicine

## 2019-07-06 ENCOUNTER — Other Ambulatory Visit: Payer: Self-pay

## 2019-07-06 ENCOUNTER — Ambulatory Visit (HOSPITAL_COMMUNITY)
Admission: RE | Admit: 2019-07-06 | Discharge: 2019-07-06 | Disposition: A | Discharge status: Home / Self Care | Payer: Medicare Other | Source: Ambulatory Visit | Attending: Internal Medicine | Admitting: Internal Medicine

## 2019-07-06 DIAGNOSIS — I2510 Atherosclerotic heart disease of native coronary artery without angina pectoris: Secondary | ICD-10-CM | POA: Diagnosis not present

## 2019-07-06 DIAGNOSIS — I10 Essential (primary) hypertension: Secondary | ICD-10-CM | POA: Insufficient documentation

## 2019-07-06 DIAGNOSIS — F329 Major depressive disorder, single episode, unspecified: Secondary | ICD-10-CM | POA: Insufficient documentation

## 2019-07-06 DIAGNOSIS — C3490 Malignant neoplasm of unspecified part of unspecified bronchus or lung: Secondary | ICD-10-CM | POA: Diagnosis present

## 2019-07-06 DIAGNOSIS — K589 Irritable bowel syndrome without diarrhea: Secondary | ICD-10-CM | POA: Diagnosis not present

## 2019-07-06 DIAGNOSIS — I4891 Unspecified atrial fibrillation: Secondary | ICD-10-CM | POA: Diagnosis not present

## 2019-07-06 DIAGNOSIS — Z79899 Other long term (current) drug therapy: Secondary | ICD-10-CM | POA: Insufficient documentation

## 2019-07-06 DIAGNOSIS — Z8673 Personal history of transient ischemic attack (TIA), and cerebral infarction without residual deficits: Secondary | ICD-10-CM | POA: Diagnosis not present

## 2019-07-06 DIAGNOSIS — C3431 Malignant neoplasm of lower lobe, right bronchus or lung: Secondary | ICD-10-CM | POA: Insufficient documentation

## 2019-07-06 DIAGNOSIS — J439 Emphysema, unspecified: Secondary | ICD-10-CM | POA: Diagnosis not present

## 2019-07-06 DIAGNOSIS — Z7901 Long term (current) use of anticoagulants: Secondary | ICD-10-CM | POA: Insufficient documentation

## 2019-07-06 LAB — CBC WITH DIFFERENTIAL (CANCER CENTER ONLY)
Abs Immature Granulocytes: 0.02 10*3/uL (ref 0.00–0.07)
Basophils Absolute: 0.1 10*3/uL (ref 0.0–0.1)
Basophils Relative: 1 %
Eosinophils Absolute: 0.8 10*3/uL — ABNORMAL HIGH (ref 0.0–0.5)
Eosinophils Relative: 9 %
HCT: 42.4 % (ref 39.0–52.0)
Hemoglobin: 13.5 g/dL (ref 13.0–17.0)
Immature Granulocytes: 0 %
Lymphocytes Relative: 19 %
Lymphs Abs: 1.6 10*3/uL (ref 0.7–4.0)
MCH: 31.3 pg (ref 26.0–34.0)
MCHC: 31.8 g/dL (ref 30.0–36.0)
MCV: 98.1 fL (ref 80.0–100.0)
Monocytes Absolute: 0.8 10*3/uL (ref 0.1–1.0)
Monocytes Relative: 10 %
Neutro Abs: 5.2 10*3/uL (ref 1.7–7.7)
Neutrophils Relative %: 61 %
Platelet Count: 192 10*3/uL (ref 150–400)
RBC: 4.32 MIL/uL (ref 4.22–5.81)
RDW: 13.1 % (ref 11.5–15.5)
WBC Count: 8.5 10*3/uL (ref 4.0–10.5)
nRBC: 0 % (ref 0.0–0.2)

## 2019-07-06 LAB — CMP (CANCER CENTER ONLY)
ALT: 24 U/L (ref 0–44)
AST: 23 U/L (ref 15–41)
Albumin: 3.7 g/dL (ref 3.5–5.0)
Alkaline Phosphatase: 115 U/L (ref 38–126)
Anion gap: 9 (ref 5–15)
BUN: 17 mg/dL (ref 8–23)
CO2: 28 mmol/L (ref 22–32)
Calcium: 8.7 mg/dL — ABNORMAL LOW (ref 8.9–10.3)
Chloride: 103 mmol/L (ref 98–111)
Creatinine: 1.16 mg/dL (ref 0.61–1.24)
GFR, Est AFR Am: >60 mL/min (ref >60)
GFR, Estimated: 60 mL/min — ABNORMAL LOW (ref >60)
Glucose, Bld: 108 mg/dL — ABNORMAL HIGH (ref 70–99)
Potassium: 4.3 mmol/L (ref 3.5–5.1)
Sodium: 140 mmol/L (ref 135–145)
Total Bilirubin: 0.9 mg/dL (ref 0.3–1.2)
Total Protein: 6.7 g/dL (ref 6.5–8.1)

## 2019-07-06 MED ORDER — SODIUM CHLORIDE (PF) 0.9 % IJ SOLN
INTRAMUSCULAR | Status: AC
  Filled 2019-07-06: qty 50

## 2019-07-06 MED ORDER — IOHEXOL 300 MG/ML  SOLN
75.0000 mL | Freq: Once | INTRAMUSCULAR | Status: AC | PRN
  Administered 2019-07-06: 75 mL via INTRAVENOUS

## 2019-07-08 ENCOUNTER — Encounter: Payer: Self-pay | Admitting: Internal Medicine

## 2019-07-08 ENCOUNTER — Telehealth: Payer: Self-pay | Admitting: Internal Medicine

## 2019-07-08 ENCOUNTER — Other Ambulatory Visit: Payer: Self-pay

## 2019-07-08 ENCOUNTER — Inpatient Hospital Stay (HOSPITAL_BASED_OUTPATIENT_CLINIC_OR_DEPARTMENT_OTHER): Payer: Medicare Other | Admitting: Internal Medicine

## 2019-07-08 DIAGNOSIS — C3431 Malignant neoplasm of lower lobe, right bronchus or lung: Secondary | ICD-10-CM | POA: Diagnosis not present

## 2019-07-08 DIAGNOSIS — C3490 Malignant neoplasm of unspecified part of unspecified bronchus or lung: Secondary | ICD-10-CM | POA: Diagnosis not present

## 2019-07-08 NOTE — Telephone Encounter (Signed)
Scheduled per los. Patient declined printout  

## 2019-07-08 NOTE — Progress Notes (Signed)
[place] [phone]   [phone]  OFFICE PROGRESS NOTE  [name], MD [place] [address]  DIAGNOSIS: Stage IB (T2a, N0, M0) non-small cell lung cancer, squamous cell carcinoma presented with right lower lobe lung nodule diagnosed in February 2020.  PRIOR THERAPY: Right VATS with superior segmentectomy on the right lower lobe with lymph node dissection under the care of Dr. Servando Snare on June 02, 2018.  CURRENT THERAPY: Observation.  INTERVAL HISTORY: George Vega 79 y.o. male returns to the clinic today for follow-up visit accompanied by his wife.  The patient is feeling fine today with no concerning complaints except for mild shortness of breath with exertion.  He denied having any current chest pain, cough or hemoptysis.  He denied having any fever or chills.  He has no nausea, vomiting, diarrhea or constipation.  He has no headache or visual changes.  The patient had repeat CT scan of the chest performed recently and he is here for evaluation and discussion of his discuss results.  MEDICAL HISTORY: Past Medical History:  Diagnosis Date  . Arthritis   . Atrial fibrillation, persistent ([facility])   . Chronic sinusitis   . Coronary artery disease   . Depression   . Difficult intubation    was told with shoulder done 2006-alittle narrow  . 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 Hypertension   . Jejunostomy tube fell out    when asked about this in 04/2017, pt denied ever having a J tube, feeding tube, tubes placed post surgery so ??? veracity of a previous J tube.    . 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 Spastic colon   . Squamous cell carcinoma of lung, stage I, right ([facility]) 05/07/2018   bx 04/29/18; isolated PET uptake in RLL mass  . Stroke (cerebrum) ([facility])   . SVT (supraventricular tachycardia) ([facility])     ALLERGIES:  is allergic to glucosamine-chondroitin; tetanus toxoid; fish oil; ibuprofen; and  naproxen.  MEDICATIONS:  Current Outpatient Medications  Medication Sig Dispense Refill  . acetaminophen (TYLENOL) 325 MG tablet Take 650 mg by mouth every 6 (six) hours as needed for moderate pain.    Marland Kitchen amiodarone (PACERONE) 200 MG tablet Take 1 tablet (200 mg total) by mouth daily. (Patient not taking: Reported on 06/26/2019) 84 tablet 0  . amiodarone (PACERONE) 200 MG tablet Take 1 tablet (200 mg total) by mouth daily. 30 tablet 3  . apixaban (ELIQUIS) 5 MG TABS tablet Take 1 tablet (5 mg total) by mouth 2 (two) times daily. 180 tablet 1  . Cholecalciferol (VITAMIN D3) 5000 units TABS Take 5,000 Units by mouth daily.    . ferrous sulfate (FEROSUL) 325 (65 FE) MG tablet Take 1 tablet (325 mg total) by mouth 2 (two) times daily with a meal.    . fluticasone (FLONASE) 50 MCG/ACT nasal spray Place 1-2 sprays into both nostrils daily as needed for allergies or rhinitis.    . furosemide (LASIX) 40 MG tablet Take 40 mg by mouth 3 (three) times a week.    . irbesartan (AVAPRO) 150 MG tablet TAKE 1 TABLET(150 MG) BY MOUTH DAILY (Patient taking differently: Take 150 mg by mouth daily. ) 90 tablet 3  . magnesium oxide (MAG-OX) 400 MG tablet Take 1 tablet (400 mg total) by mouth 2 (two) times daily. 60 tablet 0  . Melatonin 10 MG TABS Take 10 mg by mouth at bedtime as  needed (sleep).     . metoprolol tartrate (LOPRESSOR) 25 MG tablet Take 1 tablet (25 mg total) by mouth 2 (two) times daily. 180 tablet 3  . Multiple Vitamins-Minerals (PRESERVISION AREDS 2 PO) Take 1 capsule by mouth 2 (two) times daily.    . pantoprazole (PROTONIX) 40 MG tablet Take 40 mg by mouth daily.     Marland Kitchen PARoxetine (PAXIL) 40 MG tablet Take 40 mg by mouth at bedtime.     . polycarbophil (FIBERCON) 625 MG tablet Take 1,250 mg by mouth at bedtime.     [name] Glycol-Propyl Glycol (SYSTANE OP) Place 2 drops into both eyes 2 (two) times daily as needed (dry eyes).     . potassium chloride 20 MEQ TBCR take 1 tab by mouth every day  that you take lasix. (Patient taking differently: Take 20 mg by mouth 3 (three) times a week. take 1 tab by mouth every day that you take lasix.) 20 tablet 0  . rosuvastatin (CRESTOR) 5 MG tablet TAKE 1 TABLET BY MOUTH  EVERY MORNING (Patient taking differently: Take 5 mg by mouth daily. ) 90 tablet 3  . tamsulosin (FLOMAX) 0.4 MG CAPS capsule Take 0.4 mg by mouth at bedtime.   11   No current facility-administered medications for this visit.    SURGICAL HISTORY:  Past Surgical History:  Procedure Laterality Date  . ATRIAL FIBRILLATION ABLATION N/A 02/11/2019   Procedure: ATRIAL FIBRILLATION ABLATION;  Surgeon: Constance Haw, MD;  Location: [facility];  Service: Cardiovascular;  Laterality: N/A;  . CARDIAC CATHETERIZATION    . CATARACT EXTRACTION, BILATERAL    . COLONOSCOPY WITH PROPOFOL N/A 04/17/2017   Procedure: COLONOSCOPY WITH PROPOFOL;  Surgeon: Yetta Flock, MD;  Location: [facility];  Service: Gastroenterology;  Laterality: N/A;  . ESOPHAGEAL DILATION  2017  . ESOPHAGOGASTRODUODENOSCOPY (EGD) WITH PROPOFOL N/A 04/17/2017   Procedure: ESOPHAGOGASTRODUODENOSCOPY (EGD) WITH PROPOFOL;  Surgeon: Yetta Flock, MD;  Location: [facility];  Service: Gastroenterology;  Laterality: N/A;  . EYE SURGERY    . FOOT NEUROMA SURGERY  2002  . LEFT HEART CATH AND CORONARY ANGIOGRAPHY N/A 01/16/2019   Procedure: LEFT HEART CATH AND CORONARY ANGIOGRAPHY;  Surgeon: Jettie Booze, MD;  Location: [facility];  Service: Cardiovascular;  Laterality: N/A;  . LEFT HEART CATHETERIZATION WITH CORONARY ANGIOGRAM Right 06/14/2011   20% LM, chronic occluded mid LAD, 50% ostial LCX, 20% mid RI, RCA with collaterals to mid LAD, mid 10% stenosis, EF 60% 06/14/11  . NASAL SINUS SURGERY    . SHOULDER ARTHROSCOPY  03/01/2011   Procedure: ARTHROSCOPY SHOULDER;  Surgeon: Ninetta Lights, MD;  Location: [facility];  Service: Orthopedics;  Laterality: Right;   arthroscopy shoulder decompression subacromial partial acromioplasty with coracoacromial release, distal claviculectomy, debridement of labrium  . SHOULDER ARTHROSCOPY  03/01/2011   Procedure: ARTHROSCOPY SHOULDER;  Surgeon: Ninetta Lights, MD;  Location: [facility];  Service: Orthopedics;  Laterality: Right;  arthroscopy shoulder decompression subacromial partial acromioplasty with coracoacromial release, distal claviculectomy, debridement of labrium  . TEE WITHOUT CARDIOVERSION N/A 03/19/2016   Procedure: TRANSESOPHAGEAL ECHOCARDIOGRAM (TEE);  Surgeon: Larey Dresser, MD;  Location: [facility];  Service: Cardiovascular;  Laterality: N/A;  . TOTAL KNEE ARTHROPLASTY Right 09/23/2013   Procedure: TOTAL KNEE ARTHROPLASTY;  Surgeon: Ninetta Lights, MD;  Location: [facility];  Service: Orthopedics;  Laterality: Right;  . TOTAL SHOULDER ARTHROPLASTY  06/28/2011   Procedure: TOTAL SHOULDER ARTHROPLASTY;  Surgeon:  Ninetta Lights, MD;  Location: [facility];  Service: Orthopedics;  Laterality: Right;  . UVULOPALATOPHARYNGOPLASTY    . VIDEO ASSISTED THORACOSCOPY (VATS)/WEDGE RESECTION Right 06/02/2018   Procedure: VIDEO ASSISTED THORACOSCOPY (VATS)/WEDGE RESECTION with Lymph node disection and intercostal nerve block.;  Surgeon: [name], MD;  Location: [facility];  Service: Thoracic;  Laterality: Right;    REVIEW OF SYSTEMS:  A comprehensive review of systems was negative except for: Respiratory: positive for dyspnea on exertion   PHYSICAL EXAMINATION: General appearance: alert, cooperative and no distress Head: Normocephalic, without obvious abnormality, atraumatic Neck: no adenopathy, no JVD, supple, symmetrical, trachea midline and thyroid not enlarged, symmetric, no tenderness/mass/nodules Lymph nodes: Cervical, supraclavicular, and axillary nodes normal. Resp: clear to auscultation bilaterally Back: symmetric, no curvature. ROM normal. No CVA tenderness. Cardio:  regular rate and rhythm, S1, S2 normal, no murmur, click, rub or gallop GI: soft, non-tender; bowel sounds normal; no masses,  no organomegaly Extremities: extremities normal, atraumatic, no cyanosis or edema  ECOG PERFORMANCE STATUS: 1 - Symptomatic but completely ambulatory  Blood pressure (!) 100/59, pulse 75, temperature 97.8 F (36.6 C), temperature source Temporal, resp. rate 17, height 5\' 10"  (1.778 m), weight 215 lb 12.8 oz (97.9 kg), SpO2 96 %.  LABORATORY DATA: Lab Results  Component Value Date   WBC 8.5 07/06/2019   HGB 13.5 07/06/2019   HCT 42.4 07/06/2019   MCV 98.1 07/06/2019   PLT 192 07/06/2019      Chemistry      Component Value Date/Time   NA 140 07/06/2019 1051   NA 139 02/24/2019 1246   K 4.3 07/06/2019 1051   CL 103 07/06/2019 1051   CO2 28 07/06/2019 1051   BUN 17 07/06/2019 1051   BUN 18 02/24/2019 1246   CREATININE 1.16 07/06/2019 1051      Component Value Date/Time   CALCIUM 8.7 (L) 07/06/2019 1051   ALKPHOS 115 07/06/2019 1051   AST 23 07/06/2019 1051   ALT 24 07/06/2019 1051   BILITOT 0.9 07/06/2019 1051       RADIOGRAPHIC STUDIES: CT Chest W Contrast  Result Date: 07/06/2019 CLINICAL DATA:  Non-small-cell lung cancer. Restaging. EXAM: CT CHEST WITH CONTRAST TECHNIQUE: Multidetector CT imaging of the chest was performed during intravenous contrast administration. CONTRAST:  45mL OMNIPAQUE IOHEXOL 300 MG/ML  SOLN COMPARISON:  CTA chest 02/14/2019. FINDINGS: Cardiovascular: The heart size is normal. No substantial pericardial effusion. Coronary artery calcification is evident. Atherosclerotic calcification is noted in the wall of the thoracic aorta. Mediastinum/Nodes: Mediastinal and right hilar lymphadenopathy has progressed in the interval. 2.5 cm short axis subcarinal node on 74/2 was 1.7 cm previously. 2.1 cm short axis lymph node posterior to the left atrium has increased from 1.5 cm previously. 2.3 cm short axis right hilar lymph node was 1.2  cm when I remeasure in a similar fashion on the prior study. There is no left hilar lymphadenopathy on today's exam. The esophagus has normal imaging features. There is no axillary lymphadenopathy. Lungs/Pleura: Centrilobular and paraseptal emphysema evident. Subpleural reticulation noted in both lungs, as before. Surgical staple line noted posterior right lung, stable. Chronic atelectasis or scarring posterior right lung base is similar. Nodular component measured previously at 2.4 cm measures 1.8 cm today. Several tiny calcified and noncalcified pulmonary nodules in the periphery of the left lower lobe are stable (index noncalcified 4 mm nodule visible on image 90/series 7). No pleural effusion. Upper Abdomen: 2.2 cm stone identified in the gallbladder fundus. 5  mm nonobstructing stone noted upper pole left kidney. Musculoskeletal: Status post bilateral shoulder replacement. No worrisome lytic or sclerotic osseous abnormality. IMPRESSION: 1. Interval progression of mediastinal and right hilar lymphadenopathy, concerning for metastatic progression. 2. Stable appearance of postsurgical change in the posterior right lung. Associated chronic atelectasis or scarring posterior right lung base with nodular component measuring slightly smaller today. Continued attention on follow-up recommended. 3. Cholelithiasis. 4. 5 mm nonobstructing left renal stone. 5. Aortic Atherosclerosis (ICD10-I70.0) and Emphysema (ICD10-J43.9). Electronically Signed   By: Misty Stanley M.D.   On: 07/06/2019 14:53   LONG TERM MONITOR (3-14 DAYS)  Result Date: 06/12/2019 Max 174 bpm 11:32pm, 03/10 Min 34 bpm 09:54am, 03/14 Avg 76 bpm 1.9% PACs, less than 1% PVCs SVT occurred which could possibly be either atrial flutter or atrial tachycardia.  This occurred 6% of the time.  Triggered events associated with sinus rhythm, SVT, PACs Will Camnitz, MD   ASSESSMENT AND PLAN: This is a very pleasant 79 years old white male with a stage IB  non-small cell lung cancer, squamous cell carcinoma status post superior segmentectomy of the right lower lobe with lymph node dissection in March 2020 under the care of Dr. Servando Snare. The tumor size was 2.7 cm with visceropleural involvement. The patient has been in observation since that time. He had repeat CT scan of the chest performed recently.  I personally and independently reviewed the scan images and discussed the results with the patient and his wife today. His scan showed evidence for progressive mediastinal and right hilar adenopathy concerning for metastatic disease to this area. I recommended for the patient to have a PET scan for further evaluation and restaging of his disease. I will see him back for follow-up visit in 2 weeks for discussion of his PET scan results and further recommendation regarding his condition. The patient was advised to call immediately if he has any concerning symptoms in the interval. The patient voices understanding of current disease status and treatment options and is in agreement with the current care plan.  All questions were answered. The patient knows to call the clinic with any problems, questions or concerns. We can certainly see the patient much sooner if necessary.  Disclaimer: This note was dictated with voice recognition software. Similar sounding words can inadvertently be transcribed and may not be corrected upon review.

## 2019-07-09 ENCOUNTER — Ambulatory Visit: Payer: Medicare Other | Admitting: Cardiology

## 2019-07-09 ENCOUNTER — Encounter: Payer: Self-pay | Admitting: Internal Medicine

## 2019-07-20 ENCOUNTER — Ambulatory Visit
Admission: RE | Admit: 2019-07-20 | Discharge: 2019-07-20 | Disposition: A | Discharge status: Home / Self Care | Payer: Medicare Other | Source: Ambulatory Visit | Attending: Urology | Admitting: Urology

## 2019-07-20 ENCOUNTER — Other Ambulatory Visit: Payer: Self-pay

## 2019-07-20 DIAGNOSIS — R972 Elevated prostate specific antigen [PSA]: Secondary | ICD-10-CM

## 2019-07-20 MED ORDER — GADOBENATE DIMEGLUMINE 529 MG/ML IV SOLN
20.0000 mL | Freq: Once | INTRAVENOUS | Status: AC | PRN
  Administered 2019-07-20: 09:00:00 20 mL via INTRAVENOUS

## 2019-07-21 ENCOUNTER — Other Ambulatory Visit: Payer: Self-pay

## 2019-07-21 ENCOUNTER — Encounter: Payer: Self-pay | Admitting: Internal Medicine

## 2019-07-21 ENCOUNTER — Ambulatory Visit (HOSPITAL_COMMUNITY)
Admission: RE | Admit: 2019-07-21 | Discharge: 2019-07-21 | Disposition: A | Discharge status: Home / Self Care | Payer: Medicare Other | Source: Ambulatory Visit | Attending: Internal Medicine | Admitting: Internal Medicine

## 2019-07-21 DIAGNOSIS — Z79899 Other long term (current) drug therapy: Secondary | ICD-10-CM | POA: Diagnosis not present

## 2019-07-21 DIAGNOSIS — C787 Secondary malignant neoplasm of liver and intrahepatic bile duct: Secondary | ICD-10-CM | POA: Diagnosis not present

## 2019-07-21 DIAGNOSIS — K769 Liver disease, unspecified: Secondary | ICD-10-CM | POA: Insufficient documentation

## 2019-07-21 DIAGNOSIS — C3490 Malignant neoplasm of unspecified part of unspecified bronchus or lung: Secondary | ICD-10-CM | POA: Insufficient documentation

## 2019-07-21 DIAGNOSIS — C771 Secondary and unspecified malignant neoplasm of intrathoracic lymph nodes: Secondary | ICD-10-CM | POA: Insufficient documentation

## 2019-07-21 LAB — GLUCOSE, CAPILLARY: Glucose-Capillary: 107 mg/dL — ABNORMAL HIGH (ref 70–99)

## 2019-07-21 MED ORDER — FLUDEOXYGLUCOSE F - 18 (FDG) INJECTION
11.3800 | Freq: Once | INTRAVENOUS | Status: AC | PRN
  Administered 2019-07-21: 12:00:00 11.38 via INTRAVENOUS

## 2019-07-22 ENCOUNTER — Encounter: Payer: Self-pay | Admitting: Internal Medicine

## 2019-07-22 ENCOUNTER — Telehealth: Payer: Self-pay | Admitting: Internal Medicine

## 2019-07-22 ENCOUNTER — Other Ambulatory Visit: Payer: Self-pay | Admitting: Cardiology

## 2019-07-22 ENCOUNTER — Inpatient Hospital Stay: Payer: Medicare Other | Attending: Internal Medicine | Admitting: Internal Medicine

## 2019-07-22 ENCOUNTER — Other Ambulatory Visit: Payer: Self-pay

## 2019-07-22 VITALS — BP 89/57 | HR 68 | Temp 98.2°F | Resp 18 | Ht 70.0 in | Wt 213.5 lb

## 2019-07-22 DIAGNOSIS — C771 Secondary and unspecified malignant neoplasm of intrathoracic lymph nodes: Secondary | ICD-10-CM | POA: Insufficient documentation

## 2019-07-22 DIAGNOSIS — Z79899 Other long term (current) drug therapy: Secondary | ICD-10-CM | POA: Insufficient documentation

## 2019-07-22 DIAGNOSIS — I10 Essential (primary) hypertension: Secondary | ICD-10-CM | POA: Diagnosis not present

## 2019-07-22 DIAGNOSIS — C787 Secondary malignant neoplasm of liver and intrahepatic bile duct: Secondary | ICD-10-CM | POA: Diagnosis not present

## 2019-07-22 DIAGNOSIS — Z7901 Long term (current) use of anticoagulants: Secondary | ICD-10-CM | POA: Diagnosis not present

## 2019-07-22 DIAGNOSIS — I4819 Other persistent atrial fibrillation: Secondary | ICD-10-CM | POA: Insufficient documentation

## 2019-07-22 DIAGNOSIS — C3431 Malignant neoplasm of lower lobe, right bronchus or lung: Secondary | ICD-10-CM | POA: Diagnosis present

## 2019-07-22 DIAGNOSIS — I2510 Atherosclerotic heart disease of native coronary artery without angina pectoris: Secondary | ICD-10-CM | POA: Diagnosis not present

## 2019-07-22 DIAGNOSIS — C3491 Malignant neoplasm of unspecified part of right bronchus or lung: Secondary | ICD-10-CM | POA: Diagnosis not present

## 2019-07-22 DIAGNOSIS — C3490 Malignant neoplasm of unspecified part of unspecified bronchus or lung: Secondary | ICD-10-CM

## 2019-07-22 NOTE — Progress Notes (Signed)
[facility] [phone]   [phone]  OFFICE PROGRESS NOTE  George Lass, MD [place] [address]  DIAGNOSIS: Stage IB (T2a, N0, M0) non-small cell lung cancer, squamous cell carcinoma presented with right lower lobe lung nodule diagnosed in February 2020.  PRIOR THERAPY: Right VATS with superior segmentectomy on the right lower lobe with lymph node dissection under the care of Dr. Servando Snare on June 02, 2018.  CURRENT THERAPY: Observation.  INTERVAL HISTORY: George Vega 79 y.o. male returns to the clinic today for follow-up visit accompanied by his wife.  The patient is feeling fine today with no concerning complaints except for fatigue.  He denied having any chest pain, shortness of breath, cough or hemoptysis.  He has no nausea, vomiting, diarrhea or constipation.  He has no headache or visual changes.  The patient had a PET scan performed recently and he is here for evaluation and discussion of his PET scan results and further recommendation regarding his condition.  MEDICAL HISTORY: Past Medical History:  Diagnosis Date  . Arthritis   . Atrial fibrillation, persistent ([place])   . Chronic sinusitis   . Coronary artery disease   . Depression   . Difficult intubation    was told with shoulder done 2006-alittle narrow  . 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 Hypertension   . Jejunostomy tube fell out    when asked about this in 04/2017, pt denied ever having a J tube, feeding tube, tubes placed post surgery so ??? veracity of a previous J tube.    . 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 Spastic colon   . Squamous cell carcinoma of lung, stage I, right ([place]) 05/07/2018   bx 04/29/18; isolated PET uptake in RLL mass  . Stroke (cerebrum) ([place])   . SVT (supraventricular tachycardia) ([facility])     ALLERGIES:  is allergic to glucosamine-chondroitin; tetanus toxoid; fish oil; ibuprofen; and  naproxen.  MEDICATIONS:  Current Outpatient Medications  Medication Sig Dispense Refill  . acetaminophen (TYLENOL) 325 MG tablet Take 650 mg by mouth every 6 (six) hours as needed for moderate pain.    Marland Kitchen amiodarone (PACERONE) 200 MG tablet Take 1 tablet (200 mg total) by mouth daily. (Patient not taking: Reported on 06/26/2019) 84 tablet 0  . amiodarone (PACERONE) 200 MG tablet Take 1 tablet (200 mg total) by mouth daily. 30 tablet 3  . apixaban (ELIQUIS) 5 MG TABS tablet Take 1 tablet (5 mg total) by mouth 2 (two) times daily. 180 tablet 1  . Cholecalciferol (VITAMIN D3) 5000 units TABS Take 5,000 Units by mouth daily.    . ferrous sulfate (FEROSUL) 325 (65 FE) MG tablet Take 1 tablet (325 mg total) by mouth 2 (two) times daily with a meal.    . fluticasone (FLONASE) 50 MCG/ACT nasal spray Place 1-2 sprays into both nostrils daily as needed for allergies or rhinitis.    . furosemide (LASIX) 40 MG tablet Take 40 mg by mouth 3 (three) times a week.    . irbesartan (AVAPRO) 150 MG tablet TAKE 1 TABLET(150 MG) BY MOUTH DAILY (Patient taking differently: Take 150 mg by mouth daily. ) 90 tablet 3  . magnesium oxide (MAG-OX) 400 MG tablet Take 1 tablet (400 mg total) by mouth 2 (two) times daily. 60 tablet 0  . Melatonin 10 MG TABS Take 10 mg by mouth at bedtime as needed (sleep).     Marland Kitchen  metoprolol tartrate (LOPRESSOR) 25 MG tablet Take 1 tablet (25 mg total) by mouth 2 (two) times daily. 180 tablet 3  . Multiple Vitamins-Minerals (PRESERVISION AREDS 2 PO) Take 1 capsule by mouth 2 (two) times daily.    . pantoprazole (PROTONIX) 40 MG tablet Take 40 mg by mouth daily.     Marland Kitchen PARoxetine (PAXIL) 40 MG tablet Take 40 mg by mouth at bedtime.     . polycarbophil (FIBERCON) 625 MG tablet Take 1,250 mg by mouth at bedtime.     [name] Glycol-Propyl Glycol (SYSTANE OP) Place 2 drops into both eyes 2 (two) times daily as needed (dry eyes).     . potassium chloride 20 MEQ TBCR take 1 tab by mouth every day  that you take lasix. (Patient taking differently: Take 20 mg by mouth 3 (three) times a week. take 1 tab by mouth every day that you take lasix.) 20 tablet 0  . rosuvastatin (CRESTOR) 5 MG tablet TAKE 1 TABLET BY MOUTH  EVERY MORNING (Patient taking differently: Take 5 mg by mouth daily. ) 90 tablet 3  . tamsulosin (FLOMAX) 0.4 MG CAPS capsule Take 0.4 mg by mouth at bedtime.   11   No current facility-administered medications for this visit.    SURGICAL HISTORY:  Past Surgical History:  Procedure Laterality Date  . ATRIAL FIBRILLATION ABLATION N/A 02/11/2019   Procedure: ATRIAL FIBRILLATION ABLATION;  Surgeon: Constance Haw, MD;  Location: [facility];  Service: Cardiovascular;  Laterality: N/A;  . CARDIAC CATHETERIZATION    . CATARACT EXTRACTION, BILATERAL    . COLONOSCOPY WITH PROPOFOL N/A 04/17/2017   Procedure: COLONOSCOPY WITH PROPOFOL;  Surgeon: Yetta Flock, MD;  Location: [facility];  Service: Gastroenterology;  Laterality: N/A;  . ESOPHAGEAL DILATION  2017  . ESOPHAGOGASTRODUODENOSCOPY (EGD) WITH PROPOFOL N/A 04/17/2017   Procedure: ESOPHAGOGASTRODUODENOSCOPY (EGD) WITH PROPOFOL;  Surgeon: Yetta Flock, MD;  Location: [facility];  Service: Gastroenterology;  Laterality: N/A;  . EYE SURGERY    . FOOT NEUROMA SURGERY  2002  . LEFT HEART CATH AND CORONARY ANGIOGRAPHY N/A 01/16/2019   Procedure: LEFT HEART CATH AND CORONARY ANGIOGRAPHY;  Surgeon: Jettie Booze, MD;  Location: [facility];  Service: Cardiovascular;  Laterality: N/A;  . LEFT HEART CATHETERIZATION WITH CORONARY ANGIOGRAM Right 06/14/2011   20% LM, chronic occluded mid LAD, 50% ostial LCX, 20% mid RI, RCA with collaterals to mid LAD, mid 10% stenosis, EF 60% 06/14/11  . NASAL SINUS SURGERY    . SHOULDER ARTHROSCOPY  03/01/2011   Procedure: ARTHROSCOPY SHOULDER;  Surgeon: Ninetta Lights, MD;  Location: [facility];  Service: Orthopedics;  Laterality: Right;   arthroscopy shoulder decompression subacromial partial acromioplasty with coracoacromial release, distal claviculectomy, debridement of labrium  . SHOULDER ARTHROSCOPY  03/01/2011   Procedure: ARTHROSCOPY SHOULDER;  Surgeon: Ninetta Lights, MD;  Location: [facility];  Service: Orthopedics;  Laterality: Right;  arthroscopy shoulder decompression subacromial partial acromioplasty with coracoacromial release, distal claviculectomy, debridement of labrium  . TEE WITHOUT CARDIOVERSION N/A 03/19/2016   Procedure: TRANSESOPHAGEAL ECHOCARDIOGRAM (TEE);  Surgeon: Larey Dresser, MD;  Location: [facility];  Service: Cardiovascular;  Laterality: N/A;  . TOTAL KNEE ARTHROPLASTY Right 09/23/2013   Procedure: TOTAL KNEE ARTHROPLASTY;  Surgeon: Ninetta Lights, MD;  Location: [facility];  Service: Orthopedics;  Laterality: Right;  . TOTAL SHOULDER ARTHROPLASTY  06/28/2011   Procedure: TOTAL SHOULDER ARTHROPLASTY;  Surgeon: Ninetta Lights, MD;  Location: [facility];  Service: Orthopedics;  Laterality: Right;  . UVULOPALATOPHARYNGOPLASTY    . VIDEO ASSISTED THORACOSCOPY (VATS)/WEDGE RESECTION Right 06/02/2018   Procedure: VIDEO ASSISTED THORACOSCOPY (VATS)/WEDGE RESECTION with Lymph node disection and intercostal nerve block.;  Surgeon: [name], MD;  Location: [facility];  Service: Thoracic;  Laterality: Right;    REVIEW OF SYSTEMS:  Constitutional: positive for fatigue Eyes: negative Ears, nose, mouth, throat, and face: negative Respiratory: negative Cardiovascular: negative Gastrointestinal: negative Genitourinary:negative Integument/breast: negative Hematologic/lymphatic: negative Musculoskeletal:negative Neurological: negative Behavioral/Psych: negative Endocrine: negative Allergic/Immunologic: negative   PHYSICAL EXAMINATION: General appearance: alert, cooperative and no distress Head: Normocephalic, without obvious abnormality, atraumatic Neck: no adenopathy, no  JVD, supple, symmetrical, trachea midline and thyroid not enlarged, symmetric, no tenderness/mass/nodules Lymph nodes: Cervical, supraclavicular, and axillary nodes normal. Resp: clear to auscultation bilaterally Back: symmetric, no curvature. ROM normal. No CVA tenderness. Cardio: regular rate and rhythm, S1, S2 normal, no murmur, click, rub or gallop GI: soft, non-tender; bowel sounds normal; no masses,  no organomegaly Extremities: extremities normal, atraumatic, no cyanosis or edema Neurologic: Alert and oriented X 3, normal strength and tone. Normal symmetric reflexes. Normal coordination and gait  ECOG PERFORMANCE STATUS: 1 - Symptomatic but completely ambulatory  Blood pressure (!) 89/57, pulse 68, temperature 98.2 F (36.8 C), temperature source Temporal, resp. rate 18, height 5\' 10"  (1.778 m), weight 213 lb 8 oz (96.8 kg), SpO2 98 %.  LABORATORY DATA: Lab Results  Component Value Date   WBC 8.5 07/06/2019   HGB 13.5 07/06/2019   HCT 42.4 07/06/2019   MCV 98.1 07/06/2019   PLT 192 07/06/2019      Chemistry      Component Value Date/Time   NA 140 07/06/2019 1051   NA 139 02/24/2019 1246   K 4.3 07/06/2019 1051   CL 103 07/06/2019 1051   CO2 28 07/06/2019 1051   BUN 17 07/06/2019 1051   BUN 18 02/24/2019 1246   CREATININE 1.16 07/06/2019 1051      Component Value Date/Time   CALCIUM 8.7 (L) 07/06/2019 1051   ALKPHOS 115 07/06/2019 1051   AST 23 07/06/2019 1051   ALT 24 07/06/2019 1051   BILITOT 0.9 07/06/2019 1051       RADIOGRAPHIC STUDIES: CT Chest W Contrast  Result Date: 07/06/2019 CLINICAL DATA:  Non-small-cell lung cancer. Restaging. EXAM: CT CHEST WITH CONTRAST TECHNIQUE: Multidetector CT imaging of the chest was performed during intravenous contrast administration. CONTRAST:  68mL OMNIPAQUE IOHEXOL 300 MG/ML  SOLN COMPARISON:  CTA chest 02/14/2019. FINDINGS: Cardiovascular: The heart size is normal. No substantial pericardial effusion. Coronary artery  calcification is evident. Atherosclerotic calcification is noted in the wall of the thoracic aorta. Mediastinum/Nodes: Mediastinal and right hilar lymphadenopathy has progressed in the interval. 2.5 cm short axis subcarinal node on 74/2 was 1.7 cm previously. 2.1 cm short axis lymph node posterior to the left atrium has increased from 1.5 cm previously. 2.3 cm short axis right hilar lymph node was 1.2 cm when I remeasure in a similar fashion on the prior study. There is no left hilar lymphadenopathy on today's exam. The esophagus has normal imaging features. There is no axillary lymphadenopathy. Lungs/Pleura: Centrilobular and paraseptal emphysema evident. Subpleural reticulation noted in both lungs, as before. Surgical staple line noted posterior right lung, stable. Chronic atelectasis or scarring posterior right lung base is similar. Nodular component measured previously at 2.4 cm measures 1.8 cm today. Several tiny calcified and noncalcified pulmonary nodules in the periphery of the left  lower lobe are stable (index noncalcified 4 mm nodule visible on image 90/series 7). No pleural effusion. Upper Abdomen: 2.2 cm stone identified in the gallbladder fundus. 5 mm nonobstructing stone noted upper pole left kidney. Musculoskeletal: Status post bilateral shoulder replacement. No worrisome lytic or sclerotic osseous abnormality. IMPRESSION: 1. Interval progression of mediastinal and right hilar lymphadenopathy, concerning for metastatic progression. 2. Stable appearance of postsurgical change in the posterior right lung. Associated chronic atelectasis or scarring posterior right lung base with nodular component measuring slightly smaller today. Continued attention on follow-up recommended. 3. Cholelithiasis. 4. 5 mm nonobstructing left renal stone. 5. Aortic Atherosclerosis (ICD10-I70.0) and Emphysema (ICD10-J43.9). Electronically Signed   By: Misty Stanley M.D.   On: 07/06/2019 14:53   MR PROSTATE W WO  CONTRAST  Result Date: 07/20/2019 CLINICAL DATA:  Elevated PSA. EXAM: MR PROSTATE WITHOUT AND WITH CONTRAST TECHNIQUE: Multiplanar multisequence MRI images were obtained of the pelvis centered about the prostate. Pre and post contrast images were obtained. CONTRAST:  23mL MULTIHANCE GADOBENATE DIMEGLUMINE 529 MG/ML IV SOLN COMPARISON:  None. FINDINGS: Prostate: Artifact due to rectal gas is noted, which especially degrades diffusion imaging. -- Peripheral Zone: Ill-defined area of T2 hypointensity is seen in the right lateral and anterior mid gland and base, which 14 x 13 mm (image 29/12). This area shows moderate ADC hypointensity, but no evidence of DWI hyperintensity. Early focal contrast enhancement is seen. -- Transition/Central Zone:  Normal appearance. -- Measurements/Volume:  3.8 x 3.0 x 4.5 cm (volume = 27 cm^3) Transcapsular spread: No definite extracapsular extension Seminal vesicle involvement:  Absent Neurovascular bundle involvement:  Absent Pelvic adenopathy: None visualized Bone metastasis: None visualized Other:  Sigmoid diverticulosis, without evidence of diverticulitis. IMPRESSION: 1. Image degradation due to artifact from rectal gas. 2. Ill-defined peripheral zone signal abnormality in the right anterior and lateral mid gland and base, suspicious for high-grade carcinoma. PI-RADS 4: High (clinically significant cancer is likely to be present) (I have processed this exam in the DynaCAD application for MR/US fusion-guided biopsy if performed.) Electronically Signed   By: Marlaine Hind M.D.   On: 07/20/2019 12:35   NM PET Image Restag (PS) Skull Base To Thigh  Result Date: 07/22/2019 CLINICAL DATA:  Subsequent treatment strategy for non-small cell lung cancer. COVID vaccination is in the RIGHT or EXAM: NUCLEAR MEDICINE PET SKULL BASE TO THIGH TECHNIQUE: 11.4 mCi F-18 FDG was injected intravenously. Full-ring PET imaging was performed from the skull base to thigh after the radiotracer. CT data  was obtained and used for attenuation correction and anatomic localization. Fasting blood glucose: 107 mg/dl COMPARISON:  PET-CT 05/08/2018, CT 07/06/2019 FINDINGS: Mediastinal blood pool activity: SUV max 3.4 Liver activity: SUV max NA NECK: No hypermetabolic lymph nodes in the neck. Incidental CT findings: none CHEST: Hypermetabolic subcarinal lymph node measuring 2.5 cm short axis with SUV max equal 16.1 Hypermetabolic RIGHT hilar lymph node measures approximately 2 cm with SUV max equal 09.6 No hypermetabolic pulmonary nodules. Interval resection of the RIGHT lower lobe nodule. Incidental CT findings: Postsurgical change in the RIGHT lower lobe. Coronary artery calcification and aortic atherosclerotic calcification. ABDOMEN/PELVIS: Evidence of multifocal hepatic metastasis. There several discrete hypermetabolic small lesions in the RIGHT hepatic lobe. There approximately 6 lesions in the RIGHT hepatic lobe. For example 1 cm lesion on image 104 with SUV max equal 10.7 posterior RIGHT hepatic lobe. Lesion the dome liver measures 1 cm or less with SUV max equal 6.3. Similar lesions on image 102. Adrenal glands are normal. No  hypermetabolic adenopathy in the abdomen pelvis. Incidental CT findings: Sigmoid diverticulosis. Atherosclerotic calcification of the aorta. SKELETON: No aggressive osseous lesion. Bilateral shoulder prosthetic Incidental CT findings: none IMPRESSION: 1. Metastatic adenopathy in the mediastinum with large hypermetabolic subcarinal and RIGHT hilar lymph nodes. 2. Postsurgical change in RIGHT lower lobe without evidence local recurrence. 3. Unfortunately evidence of multifocal hepatic metastasis with multiple small hypermetabolic lesions in the RIGHT hepatic lobe. Electronically Signed   By: Suzy Bouchard M.D.   On: 07/22/2019 09:00    ASSESSMENT AND PLAN: This is a very pleasant 79 years old white male with a stage IB non-small cell lung cancer, squamous cell carcinoma status post superior  segmentectomy of the right lower lobe with lymph node dissection in March 2020 under the care of Dr. Servando Snare. The tumor size was 2.7 cm with visceropleural involvement. The patient was found on the recent CT scan of the chest to have suspicious mediastinal lymphadenopathy.  I ordered a PET scan which was performed recently.  I personally and independently reviewed the PET scan images and discussed the result and showed the images to the patient and his wife. His PET scan showed hypermetabolic activity in the mediastinal lymph nodes including a large subcarinal and right hilar lymph nodes in addition to multifocal hepatic metastasis with multiple small hypermetabolic lesions in the right hepatic lobe. I recommended for the patient to have ultrasound-guided core biopsy of one of the hypermetabolic liver lesions. I will also complete his staging work-up by ordering MRI of the brain to rule out brain metastasis. The patient will come back for follow-up visit in 2 weeks for evaluation and discussion of his treatment options based on the biopsy results and the staging work-up. For the hypertension he is currently on multiple blood pressure medication and his blood pressure is low today.  He will discuss with his primary care physician adjustment of his medication. The patient was advised to call immediately if he has any concerning symptoms in the interval. The patient voices understanding of current disease status and treatment options and is in agreement with the current care plan.  All questions were answered. The patient knows to call the clinic with any problems, questions or concerns. We can certainly see the patient much sooner if necessary.  Disclaimer: This note was dictated with voice recognition software. Similar sounding words can inadvertently be transcribed and may not be corrected upon review.

## 2019-07-22 NOTE — Telephone Encounter (Signed)
Scheduled per los. Gave avs and calendar  

## 2019-07-23 ENCOUNTER — Encounter (HOSPITAL_COMMUNITY): Payer: Self-pay

## 2019-07-23 NOTE — Progress Notes (Unsigned)
George Vega  George Vega. George Vega" Male, 79 y.o., 09-May-1940 MRN:  [id number] Phone:  [phone] Jerilynn Mages) PCP:  [name], MD Primary Cvg:  Medicare/Medicare Part A And B Next Appt With Radiology ([facility]MR 1) 07/31/2019 at 8:00 PM  RE: Biopsy Received: Today Message Contents  Jacqulynn Cadet, MD  [facility]      Approved for attempted US guided biopsy of PET avid liver lesion. Lesions are small and not visible by CT, may be able to localize with [place].    Hx lung cancer.   HKM   Previous Messages  ----- Message -----  From: Lenore Cordia  Sent: 07/23/2019  9:49 AM EDT  To: [facility] Procedure Requests  Subject: Biopsy                      Procedure Requested: Ultrasound-guided core biopsy    Reason for Procedure: of the most accessible hypermetabolic liver lesion.    Provider Requesting: Dr. Curt Bears  Provider Telephone:  [phone]    Other Info: Rad Exams in Epic

## 2019-07-30 ENCOUNTER — Encounter (INDEPENDENT_AMBULATORY_CARE_PROVIDER_SITE_OTHER): Payer: Medicare Other | Admitting: Ophthalmology

## 2019-07-31 ENCOUNTER — Other Ambulatory Visit: Payer: Self-pay | Admitting: Radiology

## 2019-07-31 ENCOUNTER — Other Ambulatory Visit: Payer: Self-pay

## 2019-07-31 ENCOUNTER — Ambulatory Visit (HOSPITAL_COMMUNITY)
Admission: RE | Admit: 2019-07-31 | Discharge: 2019-07-31 | Disposition: A | Discharge status: Home / Self Care | Payer: Medicare Other | Source: Ambulatory Visit | Attending: Internal Medicine | Admitting: Internal Medicine

## 2019-07-31 DIAGNOSIS — C3490 Malignant neoplasm of unspecified part of unspecified bronchus or lung: Secondary | ICD-10-CM | POA: Insufficient documentation

## 2019-07-31 MED ORDER — GADOBUTROL 1 MMOL/ML IV SOLN
10.0000 mL | Freq: Once | INTRAVENOUS | Status: AC | PRN
  Administered 2019-07-31: 10 mL via INTRAVENOUS

## 2019-08-02 ENCOUNTER — Emergency Department (HOSPITAL_COMMUNITY)
Admission: EM | Admit: 2019-08-02 | Discharge: 2019-08-03 | Disposition: A | Discharge status: Home / Self Care | Payer: Medicare Other | Attending: Emergency Medicine | Admitting: Emergency Medicine

## 2019-08-02 ENCOUNTER — Encounter (HOSPITAL_COMMUNITY): Payer: Self-pay

## 2019-08-02 ENCOUNTER — Emergency Department (HOSPITAL_COMMUNITY): Payer: Medicare Other

## 2019-08-02 DIAGNOSIS — Y999 Unspecified external cause status: Secondary | ICD-10-CM | POA: Diagnosis not present

## 2019-08-02 DIAGNOSIS — W109XXA Fall (on) (from) unspecified stairs and steps, initial encounter: Secondary | ICD-10-CM | POA: Insufficient documentation

## 2019-08-02 DIAGNOSIS — S06300A Unspecified focal traumatic brain injury without loss of consciousness, initial encounter: Secondary | ICD-10-CM

## 2019-08-02 DIAGNOSIS — S0101XA Laceration without foreign body of scalp, initial encounter: Secondary | ICD-10-CM

## 2019-08-02 DIAGNOSIS — Y929 Unspecified place or not applicable: Secondary | ICD-10-CM | POA: Insufficient documentation

## 2019-08-02 DIAGNOSIS — I509 Heart failure, unspecified: Secondary | ICD-10-CM | POA: Diagnosis not present

## 2019-08-02 DIAGNOSIS — Y939 Activity, unspecified: Secondary | ICD-10-CM | POA: Diagnosis not present

## 2019-08-02 DIAGNOSIS — S0990XA Unspecified injury of head, initial encounter: Secondary | ICD-10-CM

## 2019-08-02 DIAGNOSIS — R519 Headache, unspecified: Secondary | ICD-10-CM | POA: Diagnosis not present

## 2019-08-02 HISTORY — DX: Malignant (primary) neoplasm, unspecified: C80.1

## 2019-08-02 HISTORY — DX: Heart failure, unspecified: I50.9

## 2019-08-02 LAB — CBC WITH DIFFERENTIAL/PLATELET
Abs Immature Granulocytes: 0.06 10*3/uL (ref 0.00–0.07)
Basophils Absolute: 0.1 10*3/uL (ref 0.0–0.1)
Basophils Relative: 1 %
Eosinophils Absolute: 0.6 10*3/uL — ABNORMAL HIGH (ref 0.0–0.5)
Eosinophils Relative: 6 %
HCT: 42.9 % (ref 39.0–52.0)
Hemoglobin: 13.7 g/dL (ref 13.0–17.0)
Immature Granulocytes: 1 %
Lymphocytes Relative: 18 %
Lymphs Abs: 1.7 10*3/uL (ref 0.7–4.0)
MCH: 32 pg (ref 26.0–34.0)
MCHC: 31.9 g/dL (ref 30.0–36.0)
MCV: 100.2 fL — ABNORMAL HIGH (ref 80.0–100.0)
Monocytes Absolute: 0.9 10*3/uL (ref 0.1–1.0)
Monocytes Relative: 10 %
Neutro Abs: 5.7 10*3/uL (ref 1.7–7.7)
Neutrophils Relative %: 64 %
Platelets: 187 10*3/uL (ref 150–400)
RBC: 4.28 MIL/uL (ref 4.22–5.81)
RDW: 13.6 % (ref 11.5–15.5)
WBC: 9 10*3/uL (ref 4.0–10.5)
nRBC: 0 % (ref 0.0–0.2)

## 2019-08-02 LAB — URINALYSIS, ROUTINE W REFLEX MICROSCOPIC
Bacteria, UA: NONE SEEN
Bilirubin Urine: NEGATIVE
Glucose, UA: NEGATIVE mg/dL
Hgb urine dipstick: NEGATIVE
Ketones, ur: NEGATIVE mg/dL
Nitrite: NEGATIVE
Protein, ur: NEGATIVE mg/dL
Specific Gravity, Urine: 1.017 (ref 1.005–1.030)
pH: 5 (ref 5.0–8.0)

## 2019-08-02 LAB — BASIC METABOLIC PANEL
Anion gap: 11 (ref 5–15)
BUN: 15 mg/dL (ref 8–23)
CO2: 24 mmol/L (ref 22–32)
Calcium: 8.7 mg/dL — ABNORMAL LOW (ref 8.9–10.3)
Chloride: 101 mmol/L (ref 98–111)
Creatinine, Ser: 0.98 mg/dL (ref 0.61–1.24)
GFR calc Af Amer: >60 mL/min (ref >60)
GFR calc non Af Amer: >60 mL/min (ref >60)
Glucose, Bld: 111 mg/dL — ABNORMAL HIGH (ref 70–99)
Potassium: 4.5 mmol/L (ref 3.5–5.1)
Sodium: 136 mmol/L (ref 135–145)

## 2019-08-02 LAB — TYPE AND SCREEN
ABO/RH(D): O NEG
Antibody Screen: NEGATIVE

## 2019-08-02 LAB — ABO/RH: ABO/RH(D): O NEG

## 2019-08-02 MED ORDER — LIDOCAINE-EPINEPHRINE (PF) 2 %-1:200000 IJ SOLN: 20.0000 mL | Freq: Once | INTRAMUSCULAR | Status: DC

## 2019-08-02 MED ORDER — ACETAMINOPHEN 500 MG PO TABS
1000.0000 mg | ORAL_TABLET | Freq: Once | ORAL | Status: AC
  Administered 2019-08-02: 1000 mg via ORAL
  Filled 2019-08-02: qty 2

## 2019-08-02 NOTE — Discharge Instructions (Addendum)
Keep wound clean.  Watch for signs of infection.  Return for new or worsening symptoms such as vomiting, confusion, neurologic concerns. If you continue to feel well you can have your biopsy done tomorrow.  Continue to hold your Eliquis.

## 2019-08-02 NOTE — ED Triage Notes (Signed)
Pt comes via [facility] from home, walking up steps and missed a step, fell backwards onto concrete, laceration to back of head, on Eliquis, last took yesterday for liver biopsy tomorrow

## 2019-08-02 NOTE — Consult Note (Signed)
Reason for Consult: Traumatic subarachnoid hemorrhage Referring Physician: Emergency department  George Vega is an 79 y.o. male.  HPI: 79 year old gentleman who had a fall tripped on a step questionable possible brief loss of consciousness.  Currently the great patient complaining of headache denies any nausea denies any numbness tingling arms or his legs patient came to the ER and was seen has some trace traumatic subarachnoid hemorrhage he is scheduled for liver biopsy tomorrow he is on Eliquis but this was stopped on Friday.  Past Medical History:  Diagnosis Date  . Cancer ([place])    liver  . CHF (congestive heart failure) ([place])     History reviewed. No pertinent surgical history.  No family history on file.  Social History:  has no history on file for tobacco, alcohol, and drug.  Allergies:  Allergies  Allergen Reactions  . Tetanus Toxoids     Medications: I have reviewed the patient's current medications.  Results for orders placed or performed during the hospital encounter of 08/02/19 (from the past 48 hour(s))  ABO/Rh     Status: None (Preliminary result)   Collection Time: 08/02/19  8:08 PM  Result Value Ref Range   ABO/RH(D)      O NEG Performed at [facility] [address], [place], [place] [address]   CBC with Differential     Status: Abnormal   Collection Time: 08/02/19  8:15 PM  Result Value Ref Range   WBC 9.0 4.0 - 10.5 K/uL   RBC 4.28 4.22 - 5.81 MIL/uL   Hemoglobin 13.7 13.0 - 17.0 g/dL   HCT 42.9 39.0 - 52.0 %   MCV 100.2 (H) 80.0 - 100.0 fL   MCH 32.0 26.0 - 34.0 pg   MCHC 31.9 30.0 - 36.0 g/dL   RDW 13.6 11.5 - 15.5 %   Platelets 187 150 - 400 K/uL   nRBC 0.0 0.0 - 0.2 %   Neutrophils Relative % 64 %   Neutro Abs 5.7 1.7 - 7.7 K/uL   Lymphocytes Relative 18 %   Lymphs Abs 1.7 0.7 - 4.0 K/uL   Monocytes Relative 10 %   Monocytes Absolute 0.9 0.1 - 1.0 K/uL   Eosinophils Relative 6 %   Eosinophils Absolute 0.6 (H) 0.0 - 0.5 K/uL    Basophils Relative 1 %   Basophils Absolute 0.1 0.0 - 0.1 K/uL   Immature Granulocytes 1 %   Abs Immature Granulocytes 0.06 0.00 - 0.07 K/uL    Comment: Performed at [facility] [address] [place], [place] [address]  Basic metabolic panel     Status: Abnormal   Collection Time: 08/02/19  8:15 PM  Result Value Ref Range   Sodium 136 135 - 145 mmol/L   Potassium 4.5 3.5 - 5.1 mmol/L   Chloride 101 98 - 111 mmol/L   CO2 24 22 - 32 mmol/L   Glucose, Bld 111 (H) 70 - 99 mg/dL    Comment: Glucose reference range applies only to samples taken after fasting for at least 8 hours.   BUN 15 8 - 23 mg/dL   Creatinine, Ser 0.98 0.61 - 1.24 mg/dL   Calcium 8.7 (L) 8.9 - 10.3 mg/dL   GFR calc non Af Amer >60 >60 mL/min   GFR calc Af Amer >60 >60 mL/min   Anion gap 11 5 - 15    Comment: Performed at [facility] [address] [place], [place] [address]  Type and screen  Status: None (Preliminary result)   Collection Time: 08/02/19  8:42 PM  Result Value Ref Range   ABO/RH(D) O NEG    Antibody Screen PENDING    Sample Expiration      08/05/2019,2359 Performed at [facility], [place] [address], [place], [place] [address]     CT Head Wo Contrast  Result Date: 08/02/2019 CLINICAL DATA:  Fall. Blood thinners. Laceration to posterior head. EXAM: CT HEAD WITHOUT CONTRAST TECHNIQUE: Contiguous axial images were obtained from the base of the skull through the vertex without intravenous contrast. COMPARISON:  August 26, 2013 FINDINGS: Brain: There is a small amount of blood along the falx as seen on axial image 74 and coronal image 30. There is a small amount of blood to the right of the falx, probably subarachnoid in location on coronal image 28 and axial image 24. There appears to be a small amount of blood in a left frontal sulcus on axial image 25. No other intracranial blood identified. Cerebellum, brainstem, and basal cisterns are normal. Ventricles are prominent but stable.  No mass effect or midline shift is noted. White matter changes are mild. No acute cortical ischemia or infarct. Vascular: Calcified atherosclerosis is seen in the intracranial carotids. Skull: Normal. Negative for fracture or focal lesion. Sinuses/Orbits: Mucosal thickening in the frontal, ethmoid, sphenoid, and maxillary sinuses is mild. Other: There is a hematoma over the left posterior scalp. Extracranial soft tissues are otherwise normal. IMPRESSION: 1. A small amount of subdural blood is seen along the left side of the falx. A small amount of subarachnoid hemorrhage is seen in the right frontal and left frontal regions. 2. Large hematoma over the posterior scalp. 3. No other acute abnormalities. Findings called to Dr. Reather Converse. Electronically Signed   By: Dorise Bullion III M.D   On: 08/02/2019 20:40    Review of Systems  Neurological: Positive for headaches.   Blood pressure 129/84, pulse 66, temperature 98.4 F (36.9 C), resp. rate 18, height 5\' 9"  (1.753 m), weight 97.1 kg, SpO2 94 %. Physical Exam  Constitutional: He is oriented to person, place, and time.  Neurological: He is alert and oriented to person, place, and time. He has normal strength. GCS eye subscore is 4. GCS verbal subscore is 5. GCS motor subscore is 6.  Patient awake and alert pupils equal extra movements are intact cranial nerves are intact strength is 5-5 upper and lower extremities no pronator drift    Assessment/Plan: 79 year old with small amount of interhemispheric traumatic subarachnoid hemorrhage and skim subdural recommend observation in the emergency department for the next 4 to 6 hours repeat head CT if stable patient can be discharged I think it is okay for him to proceed forward with his liver biopsy tomorrow.  Moselle Rister P 08/02/2019, 9:01 PM

## 2019-08-02 NOTE — ED Provider Notes (Signed)
[facility] EMERGENCY DEPARTMENT Provider Note   CSN: [id number] Arrival date & time: 08/02/19  1956     History Chief Complaint  Patient presents with  . Fall    George Vega is a 79 y.o. male.  Patient currently being evaluated for liver cancer, congestive heart failure history presents after walking up steps missing the last step causing him to fall and hit the back of his head.  No loss of consciousness.  Patient is on Eliquis however stopped taking on Friday to prepare for liver biopsy this week/tomorrow.  Patient denies any other pain.  Patient felt well today no shortness of breath, no vomiting, no blood in the stools, no fevers.  EMS brought the patient in and held pressure on the back of his scalp from the bleeding.        Past Medical History:  Diagnosis Date  . Cancer ([place])    liver  . CHF (congestive heart failure) ([facility])     There are no problems to display for this patient.   History reviewed. No pertinent surgical history.     No family history on file.  Social History   Tobacco Use  . Smoking status: Not on file  Substance Use Topics  . Alcohol use: Not on file  . Drug use: Not on file    Home Medications Prior to Admission medications   Not on File    Allergies    Tetanus toxoids  Review of Systems   Review of Systems  Constitutional: Negative for chills and fever.  HENT: Negative for congestion.   Eyes: Negative for visual disturbance.  Respiratory: Negative for shortness of breath.   Cardiovascular: Negative for chest pain.  Gastrointestinal: Negative for abdominal pain and vomiting.  Genitourinary: Negative for dysuria and flank pain.  Musculoskeletal: Negative for back pain, neck pain and neck stiffness.  Skin: Positive for wound. Negative for rash.  Neurological: Positive for headaches. Negative for weakness and light-headedness.    Physical Exam Updated Vital Signs BP 129/84   Pulse 66   Temp 98.4 F  (36.9 C)   Resp 18   Ht 5\' 9"  (1.753 m)   Wt 97.1 kg   SpO2 94%   BMI 31.60 kg/m   Physical Exam Vitals and nursing note reviewed.  Constitutional:      Appearance: He is well-developed.  HENT:     Head: Normocephalic.     Comments: Patient has 2 cm superficial laceration with mild bleeding posterior scalp mild hematoma. Eyes:     General:        Right eye: No discharge.        Left eye: No discharge.     Conjunctiva/sclera: Conjunctivae normal.  Neck:     Trachea: No tracheal deviation.  Cardiovascular:     Rate and Rhythm: Normal rate and regular rhythm.  Pulmonary:     Effort: Pulmonary effort is normal.     Breath sounds: Normal breath sounds.  Abdominal:     General: There is no distension.     Palpations: Abdomen is soft.     Tenderness: There is no abdominal tenderness. There is no guarding.  Musculoskeletal:     Cervical back: Normal range of motion and neck supple. No rigidity or tenderness.     Comments: Patient has no midline cervical thoracic or lumbar tenderness.  Patient has no focal tenderness to range of motion of hips knees and ankles, elbows wrists or shoulders bilateral.  Skin:  General: Skin is warm.     Findings: No rash.  Neurological:     Mental Status: He is alert and oriented to person, place, and time.  Psychiatric:        Mood and Affect: Mood normal.     ED Results / Procedures / Treatments   Labs (all labs ordered are listed, but only abnormal results are displayed) Labs Reviewed  CBC WITH DIFFERENTIAL/PLATELET - Abnormal; Notable for the following components:      Result Value   MCV 100.2 (*)    Eosinophils Absolute 0.6 (*)    All other components within normal limits  BASIC METABOLIC PANEL - Abnormal; Notable for the following components:   Glucose, Bld 111 (*)    Calcium 8.7 (*)    All other components within normal limits  URINALYSIS, ROUTINE W REFLEX MICROSCOPIC  TYPE AND SCREEN  ABO/RH    EKG EKG  Interpretation  Date/Time:  Sunday Aug 02 2019 20:09:54 EDT Ventricular Rate:  66 PR Interval:    QRS Duration: 100 QT Interval:  441 QTC Calculation: 463 R Axis:   56 Text Interpretation: Sinus rhythm Low voltage, precordial leads Confirmed by Elnora Morrison [id number]) on 08/02/2019 8:41:28 PM   Radiology CT Head Wo Contrast  Result Date: 08/02/2019 CLINICAL DATA:  Fall. Blood thinners. Laceration to posterior head. EXAM: CT HEAD WITHOUT CONTRAST TECHNIQUE: Contiguous axial images were obtained from the base of the skull through the vertex without intravenous contrast. COMPARISON:  August 26, 2013 FINDINGS: Brain: There is a small amount of blood along the falx as seen on axial image 74 and coronal image 30. There is a small amount of blood to the right of the falx, probably subarachnoid in location on coronal image 28 and axial image 24. There appears to be a small amount of blood in a left frontal sulcus on axial image 25. No other intracranial blood identified. Cerebellum, brainstem, and basal cisterns are normal. Ventricles are prominent but stable. No mass effect or midline shift is noted. White matter changes are mild. No acute cortical ischemia or infarct. Vascular: Calcified atherosclerosis is seen in the intracranial carotids. Skull: Normal. Negative for fracture or focal lesion. Sinuses/Orbits: Mucosal thickening in the frontal, ethmoid, sphenoid, and maxillary sinuses is mild. Other: There is a hematoma over the left posterior scalp. Extracranial soft tissues are otherwise normal. IMPRESSION: 1. A small amount of subdural blood is seen along the left side of the falx. A small amount of subarachnoid hemorrhage is seen in the right frontal and left frontal regions. 2. Large hematoma over the posterior scalp. 3. No other acute abnormalities. Findings called to Dr. Reather Converse. Electronically Signed   By: Dorise Bullion III M.D   On: 08/02/2019 20:40    Procedures .Marland KitchenLaceration Repair  Date/Time:  08/02/2019 9:08 PM Performed by: Elnora Morrison, MD Authorized by: Elnora Morrison, MD   Consent:    Consent obtained:  Verbal   Consent given by:  Patient   Risks discussed:  Infection, pain, poor cosmetic result and poor wound healing Anesthesia (see MAR for exact dosages):    Anesthesia method:  None Laceration details:    Location:  Scalp   Scalp location:  Occipital   Length (cm):  2   Depth (mm):  5 Repair type:    Repair type:  Simple Pre-procedure details:    Preparation:  Patient was prepped and draped in usual sterile fashion Exploration:    Hemostasis achieved with:  Direct pressure   Wound  exploration: wound explored through full range of motion     Wound extent: no muscle damage noted and no vascular damage noted     Contaminated: no   Treatment:    Area cleansed with:  Saline   Amount of cleaning:  Standard   Irrigation solution:  Sterile saline   Irrigation volume:  60   Irrigation method:  Syringe   Visualized foreign bodies/material removed: yes   Skin repair:    Repair method:  Tissue adhesive Approximation:    Approximation:  Close Post-procedure details:    Dressing:  Open (no dressing)   Patient tolerance of procedure:  Tolerated well, no immediate complications   (including critical care time)  Medications Ordered in ED Medications  acetaminophen (TYLENOL) tablet 1,000 mg (1,000 mg Oral Given 08/02/19 2045)    ED Course  I have reviewed the triage vital signs and the nursing notes.  Pertinent labs & imaging results that were available during my care of the patient were reviewed by me and considered in my medical decision making (see chart for details).    MDM Rules/Calculators/A&P                      Patient presents with level 2 trauma due to patient's fall with head injury and on Eliquis.  Patient stable on arrival, normal vital signs.  Bleeding controlled with pressure and gauze.  Plan for basic blood work, CT scan of the head, wound  care.  Patient's blood work reviewed normal white blood cell count normal hemoglobin. CT scan results discussed with radiologist and with neurosurgery on-call showing small intracranial hemorrhage. Neurosurgery recommends repeat CT scan in 4 to 6hours and if not worsening stable for outpatient follow-up and patient can have his biopsy for liver tomorrow.  Hold Eliquis. On reassessment updated the patient, patient doing well, Dermabond used for scalp laceration.  Patient care will be signed out to follow-up repeat CT scan result. Final Clinical Impression(s) / ED Diagnoses Final diagnoses:  Acute head injury, initial encounter  Laceration of scalp, initial encounter  Traumatic intracranial hemorrhage without loss of consciousness, initial encounter [facility])    Rx / DC Orders ED Discharge Orders    None       Elnora Morrison, MD 08/02/19 2329

## 2019-08-03 ENCOUNTER — Encounter: Payer: Self-pay | Admitting: Internal Medicine

## 2019-08-03 ENCOUNTER — Encounter (HOSPITAL_COMMUNITY): Payer: Self-pay

## 2019-08-03 ENCOUNTER — Emergency Department (HOSPITAL_COMMUNITY): Payer: Medicare Other

## 2019-08-03 ENCOUNTER — Ambulatory Visit (HOSPITAL_COMMUNITY): Payer: Medicare Other

## 2019-08-03 ENCOUNTER — Telehealth: Payer: Self-pay | Admitting: Medical Oncology

## 2019-08-03 DIAGNOSIS — S0101XA Laceration without foreign body of scalp, initial encounter: Secondary | ICD-10-CM | POA: Diagnosis not present

## 2019-08-03 MED ORDER — PAROXETINE HCL 20 MG PO TABS
40.0000 mg | ORAL_TABLET | Freq: Once | ORAL | Status: DC
  Filled 2019-08-03: qty 2

## 2019-08-03 MED ORDER — METOPROLOL TARTRATE 25 MG PO TABS
25.0000 mg | ORAL_TABLET | Freq: Once | ORAL | Status: AC
  Administered 2019-08-03: 25 mg via ORAL
  Filled 2019-08-03: qty 1

## 2019-08-03 MED ORDER — MECLIZINE HCL 25 MG PO TABS
25.0000 mg | ORAL_TABLET | Freq: Once | ORAL | Status: AC
  Administered 2019-08-03: 25 mg via ORAL
  Filled 2019-08-03: qty 1

## 2019-08-03 MED ORDER — MECLIZINE HCL 25 MG PO TABS: 25.0000 mg | ORAL_TABLET | Freq: Three times a day (TID) | ORAL | 0 refills | Status: AC | PRN

## 2019-08-03 MED ORDER — FERROUS SULFATE 325 (65 FE) MG PO TABS
325.0000 mg | ORAL_TABLET | Freq: Once | ORAL | Status: AC
  Administered 2019-08-03: 325 mg via ORAL
  Filled 2019-08-03: qty 1

## 2019-08-03 MED ORDER — TAMSULOSIN HCL 0.4 MG PO CAPS
0.4000 mg | ORAL_CAPSULE | Freq: Once | ORAL | Status: AC
  Administered 2019-08-03: 0.4 mg via ORAL
  Filled 2019-08-03: qty 1

## 2019-08-03 MED ORDER — APIXABAN 5 MG PO TABS
5.0000 mg | ORAL_TABLET | Freq: Two times a day (BID) | ORAL | Status: DC
  Filled 2019-08-03: qty 1

## 2019-08-03 MED ORDER — MAGNESIUM OXIDE 400 (241.3 MG) MG PO TABS
400.0000 mg | ORAL_TABLET | Freq: Once | ORAL | Status: AC
  Administered 2019-08-03: 400 mg via ORAL
  Filled 2019-08-03: qty 1

## 2019-08-03 NOTE — Telephone Encounter (Addendum)
Dr Worthy Flank message left on wife's mobile phone.

## 2019-08-03 NOTE — ED Provider Notes (Signed)
Care assumed from Dr. Reather Converse, patient with head injury with small subdural hematoma and small amount of subarachnoid hemorrhage pending follow-up CT scan.  Follow-up CT scan shows no significant change.  He is felt to be safe for discharge.  Results for orders placed or performed during the hospital encounter of 08/02/19  CBC with Differential  Result Value Ref Range   WBC 9.0 4.0 - 10.5 K/uL   RBC 4.28 4.22 - 5.81 MIL/uL   Hemoglobin 13.7 13.0 - 17.0 g/dL   HCT 42.9 39.0 - 52.0 %   MCV 100.2 (H) 80.0 - 100.0 fL   MCH 32.0 26.0 - 34.0 pg   MCHC 31.9 30.0 - 36.0 g/dL   RDW 13.6 11.5 - 15.5 %   Platelets 187 150 - 400 K/uL   nRBC 0.0 0.0 - 0.2 %   Neutrophils Relative % 64 %   Neutro Abs 5.7 1.7 - 7.7 K/uL   Lymphocytes Relative 18 %   Lymphs Abs 1.7 0.7 - 4.0 K/uL   Monocytes Relative 10 %   Monocytes Absolute 0.9 0.1 - 1.0 K/uL   Eosinophils Relative 6 %   Eosinophils Absolute 0.6 (H) 0.0 - 0.5 K/uL   Basophils Relative 1 %   Basophils Absolute 0.1 0.0 - 0.1 K/uL   Immature Granulocytes 1 %   Abs Immature Granulocytes 0.06 0.00 - 0.07 K/uL  Basic metabolic panel  Result Value Ref Range   Sodium 136 135 - 145 mmol/L   Potassium 4.5 3.5 - 5.1 mmol/L   Chloride 101 98 - 111 mmol/L   CO2 24 22 - 32 mmol/L   Glucose, Bld 111 (H) 70 - 99 mg/dL   BUN 15 8 - 23 mg/dL   Creatinine, Ser 0.98 0.61 - 1.24 mg/dL   Calcium 8.7 (L) 8.9 - 10.3 mg/dL   GFR calc non Af Amer >60 >60 mL/min   GFR calc Af Amer >60 >60 mL/min   Anion gap 11 5 - 15  Urinalysis, Routine w reflex microscopic  Result Value Ref Range   Color, Urine YELLOW YELLOW   APPearance HAZY (A) CLEAR   Specific Gravity, Urine 1.017 1.005 - 1.030   pH 5.0 5.0 - 8.0   Glucose, UA NEGATIVE NEGATIVE mg/dL   Hgb urine dipstick NEGATIVE NEGATIVE   Bilirubin Urine NEGATIVE NEGATIVE   Ketones, ur NEGATIVE NEGATIVE mg/dL   Protein, ur NEGATIVE NEGATIVE mg/dL   Nitrite NEGATIVE NEGATIVE   Leukocytes,Ua SMALL (A) NEGATIVE   RBC  / HPF 0-5 0 - 5 RBC/hpf   WBC, UA 11-20 0 - 5 WBC/hpf   Bacteria, UA NONE SEEN NONE SEEN   Squamous Epithelial / LPF 0-5 0 - 5   Mucus PRESENT    Hyaline Casts, UA PRESENT   Type and screen  Result Value Ref Range   ABO/RH(D) O NEG    Antibody Screen NEG    Sample Expiration      08/05/2019,2359 Performed at [facility], [address], [place], [place] [address]   ABO/Rh  Result Value Ref Range   ABO/RH(D)      O NEG Performed at [facility] [address] [place], [place] [address]    CT Head Wo Contrast  Addendum Date: 08/03/2019   ADDENDUM REPORT: 08/03/2019 02:17 ADDENDUM: These results were called by telephone at the time of interpretation on 08/03/2019 at 2:17 am to provider Dr. Roxanne Mins, who verbally acknowledged these results. Electronically Signed   By: Lovena Le  M.D.   On: 08/03/2019 02:17   Result Date: 08/03/2019 CLINICAL DATA:  Repeat CT head for bleeding EXAM: CT HEAD WITHOUT CONTRAST TECHNIQUE: Contiguous axial images were obtained from the base of the skull through the vertex without intravenous contrast. COMPARISON:  CT 08/02/2019 FINDINGS: Brain: Redemonstration of the trace parafalcine hemorrhage well as small amount of subarachnoid hemorrhage along the mesial right frontal lobe not significantly changed from the comparison exam. Additional hemorrhage previously seen in the left frontal sulcus is less convincing on this examination. There is a punctate focus of hyperdensity in the left parietal lobe (4/27) which may have been beyond the resolution of imaging on comparison. No new sites of intracranial hemorrhage identified. Stable hypoattenuating foci in the bilateral basal ganglia may reflect sequela of prior lacunar infarct. No CT evidence of acute large vascular territory or cortically based infarct. No mass effect or midline shift. Patchy areas of white matter hypoattenuation are most compatible with chronic microvascular angiopathy. Patchy areas of  white matter hypoattenuation are most compatible with chronic microvascular angiopathy. Vascular: Atherosclerotic calcification of the carotid siphons and intradural vertebral arteries. No hyperdense vessel. Skull: Left occipital-parietal swelling and hematoma is slightly redistributed from the comparison exam. No subjacent calvarial fracture. Sinuses/Orbits: Thickening and noted throughout the ethmoids, frontal, left sphenoid and maxillary sinuses. Small amount of layering fluid in the left maxillary sinus. Left concha bullosa. Orbital structures are unremarkable aside from prior lens extractions. Other: None IMPRESSION: 1. Redemonstration of the trace parafalcine hemorrhage as well as small amount of subarachnoid hemorrhage along the mesial right frontal lobe. Hemorrhage previously seen in the left frontal sulcus is less convincing on this examination. 2. There is a punctate focus of hyperdensity in the left parietal lobe which may have been beyond the resolution of imaging on comparison. No other new sites of intracranial hemorrhage identified. 3. Left occipital-parietal swelling and hematoma is slightly redistributed from the comparison exam. No subjacent calvarial fracture. 4. Diffuse mural disease of sinuses, possibly acute on chronic sinusitis. Correlate with symptoms or for recent instrumentation. Currently attempting to contact the ordering provider with a critical value result. Addendum will be submitted upon case discussion. Electronically Signed: By: Lovena Le M.D. On: 08/03/2019 02:01   CT Head Wo Contrast  Result Date: 08/02/2019 CLINICAL DATA:  Fall. Blood thinners. Laceration to posterior head. EXAM: CT HEAD WITHOUT CONTRAST TECHNIQUE: Contiguous axial images were obtained from the base of the skull through the vertex without intravenous contrast. COMPARISON:  August 26, 2013 FINDINGS: Brain: There is a small amount of blood along the falx as seen on axial image 74 and coronal image 30. There is  a small amount of blood to the right of the falx, probably subarachnoid in location on coronal image 28 and axial image 24. There appears to be a small amount of blood in a left frontal sulcus on axial image 25. No other intracranial blood identified. Cerebellum, brainstem, and basal cisterns are normal. Ventricles are prominent but stable. No mass effect or midline shift is noted. White matter changes are mild. No acute cortical ischemia or infarct. Vascular: Calcified atherosclerosis is seen in the intracranial carotids. Skull: Normal. Negative for fracture or focal lesion. Sinuses/Orbits: Mucosal thickening in the frontal, ethmoid, sphenoid, and maxillary sinuses is mild. Other: There is a hematoma over the left posterior scalp. Extracranial soft tissues are otherwise normal. IMPRESSION: 1. A small amount of subdural blood is seen along the left side of the falx. A small amount of subarachnoid hemorrhage is  seen in the right frontal and left frontal regions. 2. Large hematoma over the posterior scalp. 3. No other acute abnormalities. Findings called to Dr. Reather Converse. Electronically Signed   By: Dorise Bullion III M.D   On: 69/22/3009 79:49     Delora Fuel, MD 97/18/20 [phone]

## 2019-08-03 NOTE — Telephone Encounter (Signed)
.   ED- Sunday - he fell after missing top step ,hit  back of head, laceration glued .  Hx vertigo last week.  CTs done-   "IMPRESSION: 1. A small amount of subdural blood is seen along the left side of the falx. A small amount of subarachnoid hemorrhage is seen in the right frontal and left frontal regions. 2. Large hematoma over the posterior scalp. 3. No other acute abnormalities."  Last dose of Eloquis was Thursday  in preparation for CT bx tomorrow.  Bx cancelled.   today room still spinning when he gets up. Using Meclizine.  1.When can his bx be rescheduled?   2.Does he stay off Eloquis?

## 2019-08-03 NOTE — Telephone Encounter (Signed)
We definitely can hold the biopsy for another week or so until he feels better. Regarding Eliquis he may need to reach out to his primary care physician or cardiologist who prescribed the Eliquis to make a decision.  I would not consider resuming it now with his recent intracranial bleeding but the final decision has to come from his cardiologist. With the continuous dizziness and vertigo, the patient may need to go back to the emergency room for evaluation.  Not sure why he was discharged home without addressing the subdural hemorrhage. Thank you.

## 2019-08-03 NOTE — Progress Notes (Signed)
Patient's wife called to notify IR that she is unable to get the patient out of bed due to vertigo and that they need to reschedule the liver biopsy. Patient's wife will call Dr. Worthy Flank office to notify them. IR schedulers will call the patient/patient's wife to reschedule the liver biopsy.  Soyla Dryer, NP

## 2019-08-04 ENCOUNTER — Telehealth: Payer: Self-pay | Admitting: Cardiology

## 2019-08-04 ENCOUNTER — Ambulatory Visit (HOSPITAL_COMMUNITY): Payer: Medicare Other

## 2019-08-04 NOTE — Telephone Encounter (Signed)
Spoke to wife who reports pt was holding his Eliquis for a liver biopsy that was supposed to be performed yesterday.  Pt has held Eliquis since last Friday as instructed.  However, pt experienced a fall 2 days ago and biopsy temporarily cancelled.  Reports pt did hit his head and 2 CT performed and per wife "he has a small bleed".  Wife is inquiring as to if pt should restart Eliquis. Advised to have PCP review. Advised I will have Dr. Curt Bears review CT/ED adx and give his recommendation.  Informed wife that if pt does have a "small bleed" then Eliquis may be held until safe to restart from a bleed standpoint.   Wife understands this and aware I will call once reviewed/advised by MD.

## 2019-08-04 NOTE — Telephone Encounter (Signed)
   Pt c/o medication issue:  1. Name of Medication:   ELIQUIS 5 MG TABS tablet    2. How are you currently taking this medication (dosage and times per day)?   3. Are you having a reaction (difficulty breathing--STAT)?   4. What is your medication issue? Pt's wife said the pt held taking this meds due to upcoming surgery, however, the surgery was cancelled and would like to know if the pt can continue taking it again.

## 2019-08-05 ENCOUNTER — Inpatient Hospital Stay: Payer: Medicare Other | Admitting: Internal Medicine

## 2019-08-05 NOTE — Telephone Encounter (Signed)
Followed back up with wife. Informed that Dr. Curt Bears recommends that neurology should advise if safe to restart Eliquis. Wife did speak w/ PCP yesterday who informed her that they reviewed CT and spoke with CT reader/neurology.  Wife was instructed by PCP that it was ok for pt to restart his medication tomorrow.  She is going to follow back up with PCP to ensure further follow up testing isn't needed. She appreciates the follow up

## 2019-08-07 ENCOUNTER — Telehealth: Payer: Self-pay | Admitting: Medical Oncology

## 2019-08-07 ENCOUNTER — Telehealth: Payer: Self-pay | Admitting: Internal Medicine

## 2019-08-07 DIAGNOSIS — C3490 Malignant neoplasm of unspecified part of unspecified bronchus or lung: Secondary | ICD-10-CM

## 2019-08-07 NOTE — Telephone Encounter (Signed)
George Vega notified of f/u lab and provider visit.

## 2019-08-07 NOTE — Telephone Encounter (Signed)
Wants to r/s liver bx. For week of June 7th -[place] beach [place]. Order placed. BX scheduled for June 8

## 2019-08-07 NOTE — Telephone Encounter (Signed)
Scheduled appt per 5/28 sch message - pt aware of appts added.

## 2019-08-13 ENCOUNTER — Encounter (HOSPITAL_COMMUNITY): Payer: Self-pay

## 2019-08-13 NOTE — Progress Notes (Signed)
Constance Haw, MD  [facility]   Cc: Curt Bears, MD   Ok to hold anticoagulation for 2 days prior to biopsy with restart as soon as medically indicated.

## 2019-08-17 ENCOUNTER — Other Ambulatory Visit: Payer: Self-pay | Admitting: Radiology

## 2019-08-18 ENCOUNTER — Other Ambulatory Visit: Payer: Self-pay

## 2019-08-18 ENCOUNTER — Encounter (HOSPITAL_COMMUNITY): Payer: Self-pay

## 2019-08-18 ENCOUNTER — Ambulatory Visit (HOSPITAL_COMMUNITY)
Admission: RE | Admit: 2019-08-18 | Discharge: 2019-08-18 | Disposition: A | Discharge status: Home / Self Care | Payer: Medicare Other | Source: Ambulatory Visit | Attending: Internal Medicine | Admitting: Internal Medicine

## 2019-08-18 DIAGNOSIS — I4891 Unspecified atrial fibrillation: Secondary | ICD-10-CM | POA: Insufficient documentation

## 2019-08-18 DIAGNOSIS — Z886 Allergy status to analgesic agent status: Secondary | ICD-10-CM | POA: Insufficient documentation

## 2019-08-18 DIAGNOSIS — Z96651 Presence of right artificial knee joint: Secondary | ICD-10-CM | POA: Insufficient documentation

## 2019-08-18 DIAGNOSIS — Z888 Allergy status to other drugs, medicaments and biological substances status: Secondary | ICD-10-CM | POA: Insufficient documentation

## 2019-08-18 DIAGNOSIS — C3491 Malignant neoplasm of unspecified part of right bronchus or lung: Secondary | ICD-10-CM | POA: Insufficient documentation

## 2019-08-18 DIAGNOSIS — Z8673 Personal history of transient ischemic attack (TIA), and cerebral infarction without residual deficits: Secondary | ICD-10-CM | POA: Diagnosis not present

## 2019-08-18 DIAGNOSIS — Z800 Family history of malignant neoplasm of digestive organs: Secondary | ICD-10-CM | POA: Diagnosis not present

## 2019-08-18 DIAGNOSIS — Z8249 Family history of ischemic heart disease and other diseases of the circulatory system: Secondary | ICD-10-CM | POA: Insufficient documentation

## 2019-08-18 DIAGNOSIS — Z79899 Other long term (current) drug therapy: Secondary | ICD-10-CM | POA: Diagnosis not present

## 2019-08-18 DIAGNOSIS — I509 Heart failure, unspecified: Secondary | ICD-10-CM | POA: Diagnosis not present

## 2019-08-18 DIAGNOSIS — K769 Liver disease, unspecified: Secondary | ICD-10-CM | POA: Diagnosis present

## 2019-08-18 DIAGNOSIS — C787 Secondary malignant neoplasm of liver and intrahepatic bile duct: Secondary | ICD-10-CM | POA: Diagnosis not present

## 2019-08-18 DIAGNOSIS — I110 Hypertensive heart disease with heart failure: Secondary | ICD-10-CM | POA: Insufficient documentation

## 2019-08-18 DIAGNOSIS — F329 Major depressive disorder, single episode, unspecified: Secondary | ICD-10-CM | POA: Insufficient documentation

## 2019-08-18 DIAGNOSIS — Z87891 Personal history of nicotine dependence: Secondary | ICD-10-CM | POA: Insufficient documentation

## 2019-08-18 DIAGNOSIS — Z887 Allergy status to serum and vaccine status: Secondary | ICD-10-CM | POA: Insufficient documentation

## 2019-08-18 DIAGNOSIS — I2510 Atherosclerotic heart disease of native coronary artery without angina pectoris: Secondary | ICD-10-CM | POA: Insufficient documentation

## 2019-08-18 DIAGNOSIS — Z7901 Long term (current) use of anticoagulants: Secondary | ICD-10-CM | POA: Diagnosis not present

## 2019-08-18 DIAGNOSIS — Z96611 Presence of right artificial shoulder joint: Secondary | ICD-10-CM | POA: Insufficient documentation

## 2019-08-18 LAB — CBC WITH DIFFERENTIAL/PLATELET
Abs Immature Granulocytes: 0.02 10*3/uL (ref 0.00–0.07)
Basophils Absolute: 0.1 10*3/uL (ref 0.0–0.1)
Basophils Relative: 1 %
Eosinophils Absolute: 0.6 10*3/uL — ABNORMAL HIGH (ref 0.0–0.5)
Eosinophils Relative: 8 %
HCT: 43.2 % (ref 39.0–52.0)
Hemoglobin: 14.2 g/dL (ref 13.0–17.0)
Immature Granulocytes: 0 %
Lymphocytes Relative: 16 %
Lymphs Abs: 1.2 10*3/uL (ref 0.7–4.0)
MCH: 32.3 pg (ref 26.0–34.0)
MCHC: 32.9 g/dL (ref 30.0–36.0)
MCV: 98.2 fL (ref 80.0–100.0)
Monocytes Absolute: 0.8 10*3/uL (ref 0.1–1.0)
Monocytes Relative: 10 %
Neutro Abs: 5 10*3/uL (ref 1.7–7.7)
Neutrophils Relative %: 65 %
Platelets: 218 10*3/uL (ref 150–400)
RBC: 4.4 MIL/uL (ref 4.22–5.81)
RDW: 13.7 % (ref 11.5–15.5)
WBC: 7.7 10*3/uL (ref 4.0–10.5)
nRBC: 0 % (ref 0.0–0.2)

## 2019-08-18 LAB — COMPREHENSIVE METABOLIC PANEL
ALT: 57 U/L — ABNORMAL HIGH (ref 0–44)
AST: 46 U/L — ABNORMAL HIGH (ref 15–41)
Albumin: 3.9 g/dL (ref 3.5–5.0)
Alkaline Phosphatase: 175 U/L — ABNORMAL HIGH (ref 38–126)
Anion gap: 9 (ref 5–15)
BUN: 24 mg/dL — ABNORMAL HIGH (ref 8–23)
CO2: 28 mmol/L (ref 22–32)
Calcium: 8.9 mg/dL (ref 8.9–10.3)
Chloride: 102 mmol/L (ref 98–111)
Creatinine, Ser: 0.83 mg/dL (ref 0.61–1.24)
GFR calc Af Amer: >60 mL/min (ref >60)
GFR calc non Af Amer: >60 mL/min (ref >60)
Glucose, Bld: 135 mg/dL — ABNORMAL HIGH (ref 70–99)
Potassium: 4.3 mmol/L (ref 3.5–5.1)
Sodium: 139 mmol/L (ref 135–145)
Total Bilirubin: 1 mg/dL (ref 0.3–1.2)
Total Protein: 7.1 g/dL (ref 6.5–8.1)

## 2019-08-18 LAB — PROTIME-INR
INR: 1 (ref 0.8–1.2)
Prothrombin Time: 12.5 seconds (ref 11.4–15.2)

## 2019-08-18 MED ORDER — LIDOCAINE HCL 1 % IJ SOLN
INTRAMUSCULAR | Status: AC
  Filled 2019-08-18: qty 20

## 2019-08-18 MED ORDER — LIDOCAINE HCL (PF) 1 % IJ SOLN
INTRAMUSCULAR | Status: AC | PRN
  Administered 2019-08-18: 10 mL via INTRADERMAL

## 2019-08-18 MED ORDER — FENTANYL CITRATE (PF) 100 MCG/2ML IJ SOLN
INTRAMUSCULAR | Status: AC | PRN
  Administered 2019-08-18 (×2): 25 ug via INTRAVENOUS

## 2019-08-18 MED ORDER — MIDAZOLAM HCL 2 MG/2ML IJ SOLN
INTRAMUSCULAR | Status: AC | PRN
  Administered 2019-08-18: 1 mg via INTRAVENOUS

## 2019-08-18 MED ORDER — GELATIN ABSORBABLE 12-7 MM EX MISC
CUTANEOUS | Status: AC
  Filled 2019-08-18: qty 1

## 2019-08-18 MED ORDER — SODIUM CHLORIDE 0.9 % IV BOLUS
500.0000 mL | Freq: Once | INTRAVENOUS | Status: AC
  Administered 2019-08-18: 500 mL via INTRAVENOUS

## 2019-08-18 MED ORDER — FENTANYL CITRATE (PF) 100 MCG/2ML IJ SOLN
INTRAMUSCULAR | Status: AC
  Filled 2019-08-18: qty 2

## 2019-08-18 MED ORDER — SODIUM CHLORIDE 0.9 % IV SOLN: INTRAVENOUS | Status: DC

## 2019-08-18 MED ORDER — MIDAZOLAM HCL 2 MG/2ML IJ SOLN
INTRAMUSCULAR | Status: AC
  Filled 2019-08-18: qty 2

## 2019-08-18 NOTE — Progress Notes (Signed)
K. Brunning PA notified of Bp of 100/64 while patient is sitting up in bed, no pain, ok to discharge to home.

## 2019-08-18 NOTE — Discharge Instructions (Signed)
Liver Biopsy, Care After These instructions give you information on caring for yourself after your procedure. Your doctor may also give you more specific instructions. Call your doctor if you have any problems or questions after your procedure. What can I expect after the procedure? After the procedure, it is common to have:  Pain and soreness where the biopsy was done.  Bruising around the area where the biopsy was done.  Sleepiness and be tired for a few days. Follow these instructions at home: Medicines  Take over-the-counter and prescription medicines only as told by your doctor.  If you were prescribed an antibiotic medicine, take it as told by your doctor. Do not stop taking the antibiotic even if you start to feel better.  Do not take medicines such as aspirin and ibuprofen. These medicines can thin your blood. Do not take these medicines unless your doctor tells you to take them.  If you are taking prescription pain medicine, take actions to prevent or treat constipation. Your doctor may recommend that you: ? Drink enough fluid to keep your pee (urine) clear or pale yellow. ? Take over-the-counter or prescription medicines. ? Eat foods that are high in fiber, such as fresh fruits and vegetables, whole grains, and beans. ? Limit foods that are high in fat and processed sugars, such as fried and sweet foods. Caring for your cut  Follow instructions from your doctor about how to take care of your cuts from surgery (incisions). Make sure you: ? Wash your hands with soap and water before you change your bandage (dressing). If you cannot use soap and water, use hand sanitizer. ? Change your bandage as told by your doctor. ? Leave stitches (sutures), skin glue, or skin tape (adhesive) strips in place. They may need to stay in place for 2 weeks or longer. If tape strips get loose and curl up, you may trim the loose edges. Do not remove tape strips completely unless your doctor says it is  okay.  Check your cuts every day for signs of infection. Check for: ? Redness, swelling, or more pain. ? Fluid or blood. ? Pus or a bad smell. ? Warmth.  Do not take baths, swim, or use a hot tub until your doctor says it is okay to do so. Activity   Rest at home for 1-2 days or as told by your doctor. ? Avoid sitting for a long time without moving. Get up to take short walks every 1-2 hours.  Return to your normal activities as told by your doctor. Ask what activities are safe for you.  Do not do these things in the first 24 hours: ? Drive. ? Use machinery. ? Take a bath or shower.  Do not lift more than 10 pounds (4.5 kg) or play contact sports for the first 2 weeks. General instructions   Do not drink alcohol in the first week after the procedure.  Have someone stay with you for at least 24 hours after the procedure.  Get your test results. Ask your doctor or the department that is doing the test: ? When will my results be ready? ? How will I get my results? ? What are my treatment options? ? What other tests do I need? ? What are my next steps?  Keep all follow-up visits as told by your doctor. This is important. Contact a doctor if:  A cut bleeds and leaves more than just a small spot of blood.  A cut is red, puffs up (  swells), or hurts more than before.  Fluid or something else comes from a cut.  A cut smells bad.  You have a fever or chills. Get help right away if:  You have swelling, bloating, or pain in your belly (abdomen).  You get dizzy or faint.  You have a rash.  You feel sick to your stomach (nauseous) or throw up (vomit).  You have trouble breathing, feel short of breath, or feel faint.  Your chest hurts.  You have problems talking or seeing.  You have trouble with your balance or moving your arms or legs. Summary  After the procedure, it is common to have pain, soreness, bruising, and tiredness.  Your doctor will tell you how to  take care of yourself at home. Change your bandage, take your medicines, and limit your activities as told by your doctor.  Call your doctor if you have symptoms of infection. Get help right away if your belly swells, your cut bleeds a lot, or you have trouble talking or breathing. This information is not intended to replace advice given to you by your health care provider. Make sure you discuss any questions you have with your health care provider. Document Revised: 03/08/2017 Document Reviewed: 03/08/2017 Elsevier Patient Education  2020 [employer] Moderate Conscious Sedation, Adult, Care After These instructions provide you with information about caring for yourself after your procedure. Your health care provider may also give you more specific instructions. Your treatment has been planned according to current medical practices, but problems sometimes occur. Call your health care provider if you have any problems or questions after your procedure. What can I expect after the procedure? After your procedure, it is common:  To feel sleepy for several hours.  To feel clumsy and have poor balance for several hours.  To have poor judgment for several hours.  To vomit if you eat too soon. Follow these instructions at home: For at least 24 hours after the procedure:   Do not: ? Participate in activities where you could fall or become injured. ? Drive. ? Use heavy machinery. ? Drink alcohol. ? Take sleeping pills or medicines that cause drowsiness. ? Make important decisions or sign legal documents. ? Take care of children on your own.  Rest. Eating and drinking  Follow the diet recommended by your health care provider.  If you vomit: ? Drink water, juice, or soup when you can drink without vomiting. ? Make sure you have little or no nausea before eating solid foods. General instructions  Have a responsible adult stay with you until you are awake and alert.  Take  over-the-counter and prescription medicines only as told by your health care provider.  If you smoke, do not smoke without supervision.  Keep all follow-up visits as told by your health care provider. This is important. Contact a health care provider if:  You keep feeling nauseous or you keep vomiting.  You feel light-headed.  You develop a rash.  You have a fever. Get help right away if:  You have trouble breathing. This information is not intended to replace advice given to you by your health care provider. Make sure you discuss any questions you have with your health care provider. Document Revised: 02/08/2017 Document Reviewed: 06/18/2015 Elsevier Patient Education  2020 [employer]  

## 2019-08-18 NOTE — Procedures (Signed)
Interventional Radiology Procedure Note  Procedure: US liver met core bx  Complications: None  Estimated Blood Loss: min  Findings: 18 g cores x 3

## 2019-08-18 NOTE — Progress Notes (Signed)
Pt. Resting post Liver biopsy procedure.  BP dropped to 75/47 58-22 and repeat BP of 78/53.   Increased fluids and called PA- Lennette Bihari Brunning.   Ordered 500 cc bolus to be given.   Liver biopsy sie/ right flank side unremarkable.    No CP or SOB per pt.   Pt. States that when he sleeps his BP and HR decreases- Cardiologist aware.

## 2019-08-18 NOTE — H&P (Signed)
Chief Complaint: Patient was seen in consultation today for a liver lesion biopsy.   Referring Physician(s): Mohamed,Mohamed  Supervising Physician: Daryll Brod  Patient Status: [facility] - Out-pt  History of Present Illness: George Vega is a 79 y.o. male with a medical history that includes SVT, atrial fibrillation (on Eliquis), stroke, CAD, CHF and squamous cell carcinoma of the right lung. He had a lung biopsy done 04/29/2018 by Dr. Annamaria Boots and a right video assisted thoracoscopy June 02, 2018 by Dr. Servando Snare.  The patient did not receive any other therapy for his Stage Ib cancer. Follow up CT chest 07/06/2019 showed: Interval progression of mediastinal and right hilar lymphadenopathy, concerning for metastatic progression. A PET scan on 07/22/2019 confirmed the CT findings as well as a discovery of multifocal hepatic metastasis with multiple small hypermetabolic lesions in the RIGHT hepatic lobe; "Evidence of multifocal hepatic metastasis. There are several discrete hypermetabolic small lesions in the RIGHT hepatic lobe. There approximately 6 lesions in the RIGHT hepatic lobe. For example 1 cm lesion on image 104 with SUV max equal 10.7 posterior RIGHT hepatic lobe. Lesion the dome liver measures 1 cm or less with SUV max equal 6.3. Similar lesions on image 102."  Interventional Radiology has been asked to evaluate this patient for a liver lesion biopsy. Imaging was reviewed by Dr. Laurence Ferrari. Patient was scheduled to have the biopsy done 08/03/2019, however, the patient fell the day before on 08/02/2019. Imaging in the ED demonstrated a small intracranial hemorrhage but after a period of observation the patient was discharged home. He was suffering from a severe bout of vertigo the day of his liver biopsy and was unable to make it to [facility] for his procedure.       Past Medical History:  Diagnosis Date  . Arthritis   . Atrial fibrillation, persistent ([place])   . Cancer ([place])    liver   . CHF (congestive heart failure) ([place])   . Chronic sinusitis   . Coronary artery disease   . Depression   . Difficult intubation    was told with shoulder done 2006-alittle narrow  . 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 Hypertension   . Jejunostomy tube fell out    when asked about this in 04/2017, pt denied ever having a J tube, feeding tube, tubes placed post surgery so ??? veracity of a previous J tube.    . 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 Spastic colon   . Squamous cell carcinoma of lung, stage I, right ([place]) 05/07/2018   bx 04/29/18; isolated PET uptake in RLL mass  . Stroke (cerebrum) ([place])   . SVT (supraventricular tachycardia) ([facility])     Past Surgical History:  Procedure Laterality Date  . ATRIAL FIBRILLATION ABLATION N/A 02/11/2019   Procedure: ATRIAL FIBRILLATION ABLATION;  Surgeon: Constance Haw, MD;  Location: [place] [facility];  Service: Cardiovascular;  Laterality: N/A;  . CARDIAC CATHETERIZATION    . CATARACT EXTRACTION, BILATERAL    . COLONOSCOPY WITH PROPOFOL N/A 04/17/2017   Procedure: COLONOSCOPY WITH PROPOFOL;  Surgeon: Yetta Flock, MD;  Location: [place];  Service: Gastroenterology;  Laterality: N/A;  . ESOPHAGEAL DILATION  2017  . ESOPHAGOGASTRODUODENOSCOPY (EGD) WITH PROPOFOL N/A 04/17/2017   Procedure: ESOPHAGOGASTRODUODENOSCOPY (EGD) WITH PROPOFOL;  Surgeon: Yetta Flock, MD;  Location: [place];  Service: Gastroenterology;  Laterality: N/A;  . EYE SURGERY    . FOOT NEUROMA SURGERY  2002  . LEFT HEART  CATH AND CORONARY ANGIOGRAPHY N/A 01/16/2019   Procedure: LEFT HEART CATH AND CORONARY ANGIOGRAPHY;  Surgeon: Jettie Booze, MD;  Location: [place] [facility];  Service: Cardiovascular;  Laterality: N/A;  . LEFT HEART CATHETERIZATION WITH CORONARY ANGIOGRAM Right 06/14/2011   20% LM, chronic occluded mid LAD, 50% ostial LCX, 20% mid RI, RCA with collaterals to mid LAD, mid 10% stenosis, EF 60% 06/14/11  .  NASAL SINUS SURGERY    . SHOULDER ARTHROSCOPY  03/01/2011   Procedure: ARTHROSCOPY SHOULDER;  Surgeon: Ninetta Lights, MD;  Location: [place];  Service: Orthopedics;  Laterality: Right;  arthroscopy shoulder decompression subacromial partial acromioplasty with coracoacromial release, distal claviculectomy, debridement of labrium  . SHOULDER ARTHROSCOPY  03/01/2011   Procedure: ARTHROSCOPY SHOULDER;  Surgeon: Ninetta Lights, MD;  Location: [place];  Service: Orthopedics;  Laterality: Right;  arthroscopy shoulder decompression subacromial partial acromioplasty with coracoacromial release, distal claviculectomy, debridement of labrium  . TEE WITHOUT CARDIOVERSION N/A 03/19/2016   Procedure: TRANSESOPHAGEAL ECHOCARDIOGRAM (TEE);  Surgeon: Larey Dresser, MD;  Location: [place];  Service: Cardiovascular;  Laterality: N/A;  . TOTAL KNEE ARTHROPLASTY Right 09/23/2013   Procedure: TOTAL KNEE ARTHROPLASTY;  Surgeon: Ninetta Lights, MD;  Location: [place];  Service: Orthopedics;  Laterality: Right;  . TOTAL SHOULDER ARTHROPLASTY  06/28/2011   Procedure: TOTAL SHOULDER ARTHROPLASTY;  Surgeon: Ninetta Lights, MD;  Location: [place];  Service: Orthopedics;  Laterality: Right;  . UVULOPALATOPHARYNGOPLASTY    . VIDEO ASSISTED THORACOSCOPY (VATS)/WEDGE RESECTION Right 06/02/2018   Procedure: VIDEO ASSISTED THORACOSCOPY (VATS)/WEDGE RESECTION with Lymph node disection and intercostal nerve block.;  Surgeon: [name], MD;  Location: [place];  Service: Thoracic;  Laterality: Right;    Allergies: Glucosamine-chondroitin, Tetanus toxoid, Fish oil, Ibuprofen, Naproxen, Glucosamine-chondroitin, Ibuprofen, Naproxen, and Tetanus toxoids  Medications: Prior to Admission medications   Medication Sig Start Date End Date Taking? Authorizing Provider  acetaminophen (TYLENOL) 325 MG tablet Take 650 mg by mouth every 6 (six) hours as needed for moderate pain.     [provider]  acetaminophen (TYLENOL) 325 MG tablet Take 650 mg by mouth 2 (two) times daily as needed for mild pain or headache.    [provider]  amiodarone (PACERONE) 200 MG tablet Take 1 tablet (200 mg total) by mouth daily. Patient not taking: Reported on 06/26/2019 06/25/19   Constance Haw, MD  amiodarone (PACERONE) 200 MG tablet Take 1 tablet (200 mg total) by mouth daily. 06/25/19   Camnitz, Ocie Doyne, MD  amiodarone (PACERONE) 200 MG tablet Take 200 mg by mouth daily. 07/22/19   [provider]  apixaban (ELIQUIS) 5 MG TABS tablet Take 1 tablet (5 mg total) by mouth 2 (two) times daily. 03/25/19   Camnitz, Ocie Doyne, MD  Cholecalciferol (VITAMIN D3) 5000 units TABS Take 5,000 Units by mouth daily.    [provider]  Cholecalciferol 125 MCG (5000 UT) TABS Take 1 tablet by mouth daily.    [provider]  ELIQUIS 5 MG TABS tablet Take 5 mg by mouth 2 (two) times daily. 06/20/19   [provider]  ferrous sulfate (FEROSUL) 325 (65 FE) MG tablet Take 1 tablet (325 mg total) by mouth 2 (two) times daily with a meal. 06/06/18   Nani Skillern, PA-C  ferrous sulfate (FEROSUL) 325 (65 FE) MG tablet Take 325 mg by mouth 2 (two) times daily.    [provider]  fluticasone Asencion Islam)  50 MCG/ACT nasal spray Place 1-2 sprays into both nostrils daily as needed for allergies or rhinitis.    [provider]  furosemide (LASIX) 40 MG tablet Take 40 mg by mouth 3 (three) times a week.    [provider]  furosemide (LASIX) 40 MG tablet Take 1 tablet by mouth every Monday, Wednesday, and Friday. 03/10/19   [provider]  irbesartan (AVAPRO) 150 MG tablet TAKE 1 TABLET(150 MG) BY MOUTH DAILY Patient taking differently: Take 150 mg by mouth daily.  11/25/18   Evans Lance, MD  irbesartan (AVAPRO) 150 MG tablet Take 150 mg by mouth daily. 06/20/19   [provider]  magnesium oxide (MAG-OX) 400  MG tablet Take 1 tablet (400 mg total) by mouth 2 (two) times daily. 02/17/19   [name], MD  MAGNESIUM-OXIDE 400 (241.3 Mg) MG tablet Take 1 tablet by mouth 2 (two) times daily. 02/17/19   [provider]  meclizine (ANTIVERT) 25 MG tablet Take 1 tablet (25 mg total) by mouth 3 (three) times daily as needed for dizziness. 3/53/61   Delora Fuel, MD  Melatonin 10 MG TABS Take 10 mg by mouth at bedtime as needed (sleep).     [provider]  Melatonin 10 MG TABS Take 1 tablet by mouth at bedtime as needed (Sleep).    [provider]  metoprolol tartrate (LOPRESSOR) 25 MG tablet Take 1 tablet (25 mg total) by mouth 2 (two) times daily. 05/27/19   Camnitz, Ocie Doyne, MD  metoprolol tartrate (LOPRESSOR) 25 MG tablet Take 25 mg by mouth 2 (two) times daily. 05/27/19   [provider]  Multiple Vitamins-Minerals (PRESERVISION AREDS 2 PO) Take 1 capsule by mouth 2 (two) times daily.    [provider]  Multiple Vitamins-Minerals (PRESERVISION AREDS 2) CAPS Take 1 capsule by mouth 2 (two) times daily.    [provider]  pantoprazole (PROTONIX) 40 MG tablet Take 40 mg by mouth daily.  02/22/17   [provider]  pantoprazole (PROTONIX) 40 MG tablet Take 40 mg by mouth daily. 07/22/19   [provider]  PARoxetine (PAXIL) 40 MG tablet Take 40 mg by mouth at bedtime.     [provider]  PARoxetine (PAXIL) 40 MG tablet Take 40 mg by mouth at bedtime. 05/18/19   [provider]  polycarbophil (FIBERCON) 625 MG tablet Take 1,250 mg by mouth at bedtime.     [provider]  polycarbophil (FIBERCON) 625 MG tablet Take 1,250 mg by mouth at bedtime.    [provider]  Polyethyl Glycol-Propyl Glycol (SYSTANE OP) Place 2 drops into both eyes 2 (two) times daily as needed (dry eyes).     [provider]  Polyethyl Glycol-Propyl Glycol (SYSTANE OP) Apply 2 drops to eye daily as needed (Irritated eyes).     [provider]  potassium chloride 20 MEQ TBCR take 1 tab by mouth every day that you take lasix. Patient taking differently: Take 20 mg by mouth 3 (three) times a week. take 1 tab by mouth every day that you take lasix. 02/17/19   [name], MD  Potassium Chloride ER 20 MEQ TBCR Take 1 tablet by mouth every Monday, Wednesday, and Friday. Take with furosemide. 02/17/19   [provider]  rosuvastatin (CRESTOR) 5 MG tablet TAKE 1 TABLET BY MOUTH  EVERY MORNING Patient taking differently: Take 5 mg by mouth daily.  10/23/18   Evans Lance, MD  rosuvastatin ([place])  5 MG tablet Take 5 mg by mouth daily. 07/02/19   [provider]  tamsulosin (FLOMAX) 0.4 MG CAPS capsule Take 0.4 mg by mouth at bedtime.  12/04/16   [provider]  tamsulosin (FLOMAX) 0.4 MG CAPS capsule Take 0.4 mg by mouth at bedtime.  06/24/19   [provider]     Family History  Problem Relation Age of Onset  . Cancer Mother        colon  . Heart attack Father   . Migraines Brother     Social History   Socioeconomic History  . Marital status: Married    Spouse name: Not on file  . Number of children: 3  . Years of education: Not on file  . Highest education level: Not on file  Occupational History  . Occupation: retired  Tobacco Use  . Smoking status: Former Smoker    Packs/day: 2.00    Years: 30.00    Pack years: 60.00    Types: Cigarettes    Quit date: 02/27/1998    Years since quitting: 21.4  . Smokeless tobacco: Never Used  Substance and Sexual Activity  . Alcohol use: Yes    Alcohol/week: 14.0 standard drinks    Types: 14 Standard drinks or equivalent per week    Comment: 2 drinks daily ([place])  . Drug use: No  . Sexual activity: Not on file  Other Topics Concern  . Not on file  Social History Narrative   ** Merged History Encounter **       Patient is left handed. Patient drinks one cup caffeine daily.   Social Determinants of Health    Financial Resource Strain:   . Difficulty of Paying Living Expenses:   Food Insecurity:   . Worried About Charity fundraiser in the Last Year:   . Arboriculturist in the Last Year:   Transportation Needs:   . Film/video editor (Medical):   Marland Kitchen Lack of Transportation (Non-Medical):   Physical Activity:   . Days of Exercise per Week:   . Minutes of Exercise per Session:   Stress:   . Feeling of Stress :   Social Connections:   . Frequency of Communication with Friends and Family:   . Frequency of Social Gatherings with Friends and Family:   . Attends Religious Services:   . Active Member of Clubs or Organizations:   . Attends Archivist Meetings:   Marland Kitchen Marital Status:      Review of Systems: A 12 point ROS discussed and pertinent positives are indicated in the HPI above.  All other systems are negative.  Review of Systems  Constitutional: Negative for fatigue and fever.  Respiratory: Negative for cough and shortness of breath.   Cardiovascular: Negative for chest pain and leg swelling.  Gastrointestinal: Negative for abdominal distention, abdominal pain, diarrhea, nausea and vomiting.  Musculoskeletal: Negative for back pain.  Neurological: Positive for dizziness.       Occasional dizziness    Vital Signs: Temp: 97.7, BP 115/69, HR 70, RR 18, 97% on room air  Physical Exam Constitutional:      General: He is not in acute distress.    Appearance: Normal appearance.  HENT:     Mouth/Throat:     Mouth: Mucous membranes are moist.  Cardiovascular:     Rate and Rhythm: Normal rate and regular rhythm.     Pulses: Normal pulses.     Heart sounds: Normal heart sounds.  Pulmonary:     Effort: Pulmonary effort is normal.     Breath sounds: Normal breath sounds.  Abdominal:     General: Bowel sounds are normal.     Palpations: Abdomen is soft.     Tenderness: There is no abdominal tenderness.  Skin:    General: Skin is warm and dry.  Neurological:      Mental Status: He is alert and oriented to person, place, and time.  Psychiatric:        Mood and Affect: Mood normal.        Behavior: Behavior normal.        Thought Content: Thought content normal.        Judgment: Judgment normal.     Imaging: CT Head Wo Contrast  Addendum Date: 08/03/2019   ADDENDUM REPORT: 08/03/2019 02:17 ADDENDUM: These results were called by telephone at the time of interpretation on 08/03/2019 at 2:17 am to provider Dr. Roxanne Mins, who verbally acknowledged these results. Electronically Signed   By: Lovena Le M.D.   On: 08/03/2019 02:17   Result Date: 08/03/2019 CLINICAL DATA:  Repeat CT head for bleeding EXAM: CT HEAD WITHOUT CONTRAST TECHNIQUE: Contiguous axial images were obtained from the base of the skull through the vertex without intravenous contrast. COMPARISON:  CT 08/02/2019 FINDINGS: Brain: Redemonstration of the trace parafalcine hemorrhage well as small amount of subarachnoid hemorrhage along the mesial right frontal lobe not significantly changed from the comparison exam. Additional hemorrhage previously seen in the left frontal sulcus is less convincing on this examination. There is a punctate focus of hyperdensity in the left parietal lobe (4/27) which may have been beyond the resolution of imaging on comparison. No new sites of intracranial hemorrhage identified. Stable hypoattenuating foci in the bilateral basal ganglia may reflect sequela of prior lacunar infarct. No CT evidence of acute large vascular territory or cortically based infarct. No mass effect or midline shift. Patchy areas of white matter hypoattenuation are most compatible with chronic microvascular angiopathy. Patchy areas of white matter hypoattenuation are most compatible with chronic microvascular angiopathy. Vascular: Atherosclerotic calcification of the carotid siphons and intradural vertebral arteries. No hyperdense vessel. Skull: Left occipital-parietal swelling and hematoma is slightly  redistributed from the comparison exam. No subjacent calvarial fracture. Sinuses/Orbits: Thickening and noted throughout the ethmoids, frontal, left sphenoid and maxillary sinuses. Small amount of layering fluid in the left maxillary sinus. Left concha bullosa. Orbital structures are unremarkable aside from prior lens extractions. Other: None IMPRESSION: 1. Redemonstration of the trace parafalcine hemorrhage as well as small amount of subarachnoid hemorrhage along the mesial right frontal lobe. Hemorrhage previously seen in the left frontal sulcus is less convincing on this examination. 2. There is a punctate focus of hyperdensity in the left parietal lobe which may have been beyond the resolution of imaging on comparison. No other new sites of intracranial hemorrhage identified. 3. Left occipital-parietal swelling and hematoma is slightly redistributed from the comparison exam. No subjacent calvarial fracture. 4. Diffuse mural disease of sinuses, possibly acute on chronic sinusitis. Correlate with symptoms or for recent instrumentation. Currently attempting to contact the ordering provider with a critical value result. Addendum will be submitted upon case discussion. Electronically Signed: By: Lovena Le M.D. On: 08/03/2019 02:01   CT Head Wo Contrast  Result Date: 08/02/2019 CLINICAL DATA:  Fall. Blood thinners. Laceration to posterior head. EXAM: CT HEAD WITHOUT CONTRAST TECHNIQUE: Contiguous axial images were obtained from the base of the skull through the vertex without intravenous contrast.  COMPARISON:  August 26, 2013 FINDINGS: Brain: There is a small amount of blood along the falx as seen on axial image 74 and coronal image 30. There is a small amount of blood to the right of the falx, probably subarachnoid in location on coronal image 28 and axial image 24. There appears to be a small amount of blood in a left frontal sulcus on axial image 25. No other intracranial blood identified. Cerebellum,  brainstem, and basal cisterns are normal. Ventricles are prominent but stable. No mass effect or midline shift is noted. White matter changes are mild. No acute cortical ischemia or infarct. Vascular: Calcified atherosclerosis is seen in the intracranial carotids. Skull: Normal. Negative for fracture or focal lesion. Sinuses/Orbits: Mucosal thickening in the frontal, ethmoid, sphenoid, and maxillary sinuses is mild. Other: There is a hematoma over the left posterior scalp. Extracranial soft tissues are otherwise normal. IMPRESSION: 1. A small amount of subdural blood is seen along the left side of the falx. A small amount of subarachnoid hemorrhage is seen in the right frontal and left frontal regions. 2. Large hematoma over the posterior scalp. 3. No other acute abnormalities. Findings called to Dr. Reather Converse. Electronically Signed   By: Dorise Bullion III M.D   On: 08/02/2019 20:40   MR BRAIN W WO CONTRAST  Result Date: 08/01/2019 CLINICAL DATA:  Non-small cell lung cancer, staging. EXAM: MRI HEAD WITHOUT AND WITH CONTRAST TECHNIQUE: Multiplanar, multiecho pulse sequences of the brain and surrounding structures were obtained without and with intravenous contrast. CONTRAST:  26mL GADAVIST GADOBUTROL 1 MMOL/ML IV SOLN COMPARISON:  05/07/2017 MRI head. FINDINGS: Brain: Moderate diffuse parenchymal volume loss with ex vacuo dilatation. Mild chronic microvascular ischemic changes. Remote right basal ganglia and left corona radiata lacunar infarcts. No abnormal enhancement. No acute infarct. No midline shift or extra-axial fluid collection. No intracranial hemorrhage. Vascular: Chronic left vertebral artery occlusion. Otherwise normal intravascular enhancement. Skull and upper cervical spine: Normal marrow signal. Sinuses/Orbits: Sequela of bilateral lens replacement. Mild pansinus mucosal thickening. No mastoid effusion. Other: None. IMPRESSION: No evidence of intracranial metastases. Moderate cerebral atrophy and  mild chronic microvascular ischemic changes. Remote right basal ganglia and left corona radiata infarcts. Chronic left vertebral artery occlusion. Electronically Signed   By: Primitivo Gauze M.D.   On: 08/01/2019 10:42   MR PROSTATE W WO CONTRAST  Result Date: 07/20/2019 CLINICAL DATA:  Elevated PSA. EXAM: MR PROSTATE WITHOUT AND WITH CONTRAST TECHNIQUE: Multiplanar multisequence MRI images were obtained of the pelvis centered about the prostate. Pre and post contrast images were obtained. CONTRAST:  68mL MULTIHANCE GADOBENATE DIMEGLUMINE 529 MG/ML IV SOLN COMPARISON:  None. FINDINGS: Prostate: Artifact due to rectal gas is noted, which especially degrades diffusion imaging. -- Peripheral Zone: Ill-defined area of T2 hypointensity is seen in the right lateral and anterior mid gland and base, which 14 x 13 mm (image 29/12). This area shows moderate ADC hypointensity, but no evidence of DWI hyperintensity. Early focal contrast enhancement is seen. -- Transition/Central Zone:  Normal appearance. -- Measurements/Volume:  3.8 x 3.0 x 4.5 cm (volume = 27 cm^3) Transcapsular spread: No definite extracapsular extension Seminal vesicle involvement:  Absent Neurovascular bundle involvement:  Absent Pelvic adenopathy: None visualized Bone metastasis: None visualized Other:  Sigmoid diverticulosis, without evidence of diverticulitis. IMPRESSION: 1. Image degradation due to artifact from rectal gas. 2. Ill-defined peripheral zone signal abnormality in the right anterior and lateral mid gland and base, suspicious for high-grade carcinoma. PI-RADS 4: High (clinically significant cancer is likely  to be present) (I have processed this exam in the DynaCAD application for MR/US fusion-guided biopsy if performed.) Electronically Signed   By: Marlaine Hind M.D.   On: 07/20/2019 12:35   NM PET Image Restag (PS) Skull Base To Thigh  Result Date: 07/22/2019 CLINICAL DATA:  Subsequent treatment strategy for non-small cell lung  cancer. COVID vaccination is in the RIGHT or EXAM: NUCLEAR MEDICINE PET SKULL BASE TO THIGH TECHNIQUE: 11.4 mCi F-18 FDG was injected intravenously. Full-ring PET imaging was performed from the skull base to thigh after the radiotracer. CT data was obtained and used for attenuation correction and anatomic localization. Fasting blood glucose: 107 mg/dl COMPARISON:  PET-CT 05/08/2018, CT 07/06/2019 FINDINGS: Mediastinal blood pool activity: SUV max 3.4 Liver activity: SUV max NA NECK: No hypermetabolic lymph nodes in the neck. Incidental CT findings: none CHEST: Hypermetabolic subcarinal lymph node measuring 2.5 cm short axis with SUV max equal 99.3 Hypermetabolic RIGHT hilar lymph node measures approximately 2 cm with SUV max equal 57.0 No hypermetabolic pulmonary nodules. Interval resection of the RIGHT lower lobe nodule. Incidental CT findings: Postsurgical change in the RIGHT lower lobe. Coronary artery calcification and aortic atherosclerotic calcification. ABDOMEN/PELVIS: Evidence of multifocal hepatic metastasis. There several discrete hypermetabolic small lesions in the RIGHT hepatic lobe. There approximately 6 lesions in the RIGHT hepatic lobe. For example 1 cm lesion on image 104 with SUV max equal 10.7 posterior RIGHT hepatic lobe. Lesion the dome liver measures 1 cm or less with SUV max equal 6.3. Similar lesions on image 102. Adrenal glands are normal. No hypermetabolic adenopathy in the abdomen pelvis. Incidental CT findings: Sigmoid diverticulosis. Atherosclerotic calcification of the aorta. SKELETON: No aggressive osseous lesion. Bilateral shoulder prosthetic Incidental CT findings: none IMPRESSION: 1. Metastatic adenopathy in the mediastinum with large hypermetabolic subcarinal and RIGHT hilar lymph nodes. 2. Postsurgical change in RIGHT lower lobe without evidence local recurrence. 3. Unfortunately evidence of multifocal hepatic metastasis with multiple small hypermetabolic lesions in the RIGHT  hepatic lobe. Electronically Signed   By: Suzy Bouchard M.D.   On: 07/22/2019 09:00    Labs:  CBC: Recent Labs    02/16/19 0444 07/06/19 1051 08/02/19 2015 08/18/19 1137  WBC 9.8 8.5 9.0 7.7  HGB 10.7* 13.5 13.7 14.2  HCT 33.5* 42.4 42.9 43.2  PLT 168 192 187 218    COAGS: Recent Labs    02/14/19 0245 08/18/19 1137  INR 1.5* 1.0  APTT 35  --     BMP: Recent Labs    02/24/19 1246 07/06/19 1051 08/02/19 2015 08/18/19 1137  NA 139 140 136 139  K 4.4 4.3 4.5 4.3  CL 100 103 101 102  CO2 25 28 24 28   GLUCOSE 148* 108* 111* 135*  BUN 18 17 15  24*  CALCIUM 9.6 8.7* 8.7* 8.9  CREATININE 1.05 1.16 0.98 0.83  GFRNONAA 68 60* >60 >60  GFRAA 78 >60 >60 >60    LIVER FUNCTION TESTS: Recent Labs    01/02/19 1132 02/14/19 0245 07/06/19 1051 08/18/19 1137  BILITOT 0.7 0.7 0.9 1.0  AST 26 66* 23 46*  ALT 29 64* 24 57*  ALKPHOS 131* 115 115 175*  PROT 6.4* 6.0* 6.7 7.1  ALBUMIN 3.5 3.6 3.7 3.9    TUMOR MARKERS: No results for input(s): AFPTM, CEA, CA199, CHROMGRNA in the last 8760 hours.  Assessment and Plan:  Right Lung Cancer with metastasis: Sherard Sutch. Gammel, 79 year old male presents to the [place] [facility] department today for a liver lesion  biopsy. Risks and benefits of a liver lesion biopsy were discussed with the patient including, but not limited to bleeding, infection, damage to adjacent structures or low yield requiring additional tests.  All of the questions were answered and there is agreement to proceed.  Consent signed and in chart.  The patient has been NPO and he has held his Eliquis since August 13, 2019. Labs and vitals have been reviewed and are within acceptable limits.   Thank you for this interesting consult.  I greatly enjoyed meeting SHAMON COTHRAN and look forward to participating in their care.  A copy of this report was sent to the requesting provider on this date.  Electronically Signed: Theresa Duty,  NP 08/18/2019, 12:42 PM   I spent a total of  30 Minutes   in face to face in clinical consultation, greater than 50% of which was counseling/coordinating care for liver lesion biopsy.

## 2019-08-19 ENCOUNTER — Other Ambulatory Visit: Payer: Self-pay | Admitting: Physician Assistant

## 2019-08-19 ENCOUNTER — Encounter (INDEPENDENT_AMBULATORY_CARE_PROVIDER_SITE_OTHER): Payer: Self-pay

## 2019-08-19 ENCOUNTER — Other Ambulatory Visit: Payer: Self-pay | Admitting: Internal Medicine

## 2019-08-19 DIAGNOSIS — C3491 Malignant neoplasm of unspecified part of right bronchus or lung: Secondary | ICD-10-CM

## 2019-08-20 ENCOUNTER — Other Ambulatory Visit: Payer: Self-pay

## 2019-08-20 ENCOUNTER — Inpatient Hospital Stay: Payer: Medicare Other | Attending: Internal Medicine | Admitting: Internal Medicine

## 2019-08-20 ENCOUNTER — Encounter: Payer: Self-pay | Admitting: Internal Medicine

## 2019-08-20 ENCOUNTER — Inpatient Hospital Stay: Payer: Medicare Other

## 2019-08-20 VITALS — BP 94/75 | HR 72 | Temp 97.3°F | Resp 17 | Ht 69.0 in | Wt 215.2 lb

## 2019-08-20 DIAGNOSIS — Z79899 Other long term (current) drug therapy: Secondary | ICD-10-CM | POA: Diagnosis not present

## 2019-08-20 DIAGNOSIS — I2510 Atherosclerotic heart disease of native coronary artery without angina pectoris: Secondary | ICD-10-CM | POA: Insufficient documentation

## 2019-08-20 DIAGNOSIS — I4819 Other persistent atrial fibrillation: Secondary | ICD-10-CM | POA: Diagnosis not present

## 2019-08-20 DIAGNOSIS — I10 Essential (primary) hypertension: Secondary | ICD-10-CM

## 2019-08-20 DIAGNOSIS — Z7901 Long term (current) use of anticoagulants: Secondary | ICD-10-CM | POA: Insufficient documentation

## 2019-08-20 DIAGNOSIS — C3491 Malignant neoplasm of unspecified part of right bronchus or lung: Secondary | ICD-10-CM

## 2019-08-20 DIAGNOSIS — C787 Secondary malignant neoplasm of liver and intrahepatic bile duct: Secondary | ICD-10-CM | POA: Insufficient documentation

## 2019-08-20 DIAGNOSIS — C771 Secondary and unspecified malignant neoplasm of intrathoracic lymph nodes: Secondary | ICD-10-CM | POA: Diagnosis not present

## 2019-08-20 DIAGNOSIS — C3431 Malignant neoplasm of lower lobe, right bronchus or lung: Secondary | ICD-10-CM | POA: Insufficient documentation

## 2019-08-20 DIAGNOSIS — Z5111 Encounter for antineoplastic chemotherapy: Secondary | ICD-10-CM | POA: Insufficient documentation

## 2019-08-20 LAB — CBC WITH DIFFERENTIAL (CANCER CENTER ONLY)
Abs Immature Granulocytes: 0.02 10*3/uL (ref 0.00–0.07)
Basophils Absolute: 0.1 10*3/uL (ref 0.0–0.1)
Basophils Relative: 1 %
Eosinophils Absolute: 0.6 10*3/uL — ABNORMAL HIGH (ref 0.0–0.5)
Eosinophils Relative: 8 %
HCT: 41.9 % (ref 39.0–52.0)
Hemoglobin: 13.5 g/dL (ref 13.0–17.0)
Immature Granulocytes: 0 %
Lymphocytes Relative: 20 %
Lymphs Abs: 1.5 10*3/uL (ref 0.7–4.0)
MCH: 32.1 pg (ref 26.0–34.0)
MCHC: 32.2 g/dL (ref 30.0–36.0)
MCV: 99.8 fL (ref 80.0–100.0)
Monocytes Absolute: 0.7 10*3/uL (ref 0.1–1.0)
Monocytes Relative: 9 %
Neutro Abs: 4.6 10*3/uL (ref 1.7–7.7)
Neutrophils Relative %: 62 %
Platelet Count: 202 10*3/uL (ref 150–400)
RBC: 4.2 MIL/uL — ABNORMAL LOW (ref 4.22–5.81)
RDW: 13.7 % (ref 11.5–15.5)
WBC Count: 7.6 10*3/uL (ref 4.0–10.5)
nRBC: 0 % (ref 0.0–0.2)

## 2019-08-20 LAB — CMP (CANCER CENTER ONLY)
ALT: 49 U/L — ABNORMAL HIGH (ref 0–44)
AST: 36 U/L (ref 15–41)
Albumin: 3.4 g/dL — ABNORMAL LOW (ref 3.5–5.0)
Alkaline Phosphatase: 183 U/L — ABNORMAL HIGH (ref 38–126)
Anion gap: 8 (ref 5–15)
BUN: 17 mg/dL (ref 8–23)
CO2: 27 mmol/L (ref 22–32)
Calcium: 8.9 mg/dL (ref 8.9–10.3)
Chloride: 104 mmol/L (ref 98–111)
Creatinine: 1.03 mg/dL (ref 0.61–1.24)
GFR, Est AFR Am: >60 mL/min (ref >60)
GFR, Estimated: >60 mL/min (ref >60)
Glucose, Bld: 118 mg/dL — ABNORMAL HIGH (ref 70–99)
Potassium: 3.8 mmol/L (ref 3.5–5.1)
Sodium: 139 mmol/L (ref 135–145)
Total Bilirubin: 0.8 mg/dL (ref 0.3–1.2)
Total Protein: 6.6 g/dL (ref 6.5–8.1)

## 2019-08-20 NOTE — Progress Notes (Signed)
[facility] Telephone:([phone]) [phone]   [phone]  OFFICE PROGRESS NOTE  [name], MD [place] [address]  DIAGNOSIS: Metastatic non-small cell lung cancer initially diagnosed as stage IB (T2a, N0, M0) non-small cell lung cancer, squamous cell carcinoma presented with right lower lobe lung nodule diagnosed in February 2020.  PRIOR THERAPY: Right VATS with superior segmentectomy on the right lower lobe with lymph node dissection under the care of Dr. Servando Snare on June 02, 2018.  CURRENT THERAPY: Observation.  INTERVAL HISTORY: George Vega 79 y.o. male returns to the clinic today for follow-up visit accompanied by his wife.  The patient is feeling fine today with no concerning complaints.  He denied having any current chest pain, shortness of breath, cough or hemoptysis.  He denied having any fever or chills.  He has no nausea, vomiting, diarrhea or constipation.  He has no headache or visual changes.  He underwent ultrasound-guided core biopsy of one of the liver lesion by interventional radiology.  The final pathology is still pending.  He is here today for evaluation and discussion of his condition and treatment options.   MEDICAL HISTORY: Past Medical History:  Diagnosis Date  . Arthritis   . Atrial fibrillation, persistent ([facility])   . Cancer ([facility])    liver  . CHF (congestive heart failure) ([facility])   . Chronic sinusitis   . Coronary artery disease   . Depression   . Difficult intubation    was told with shoulder done 2006-alittle narrow  . 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 Hypertension   . Jejunostomy tube fell out    when asked about this in 04/2017, pt denied ever having a J tube, feeding tube, tubes placed post surgery so ??? veracity of a previous J tube.    . 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 Spastic colon   . Squamous cell carcinoma of lung, stage I, right ([facility]) 05/07/2018   bx 04/29/18; isolated  PET uptake in RLL mass  . Stroke (cerebrum) ([facility])   . SVT (supraventricular tachycardia) ([facility])     ALLERGIES:  is allergic to glucosamine-chondroitin, tetanus toxoid, fish oil, ibuprofen, naproxen, glucosamine-chondroitin, ibuprofen, naproxen, and tetanus toxoids.  MEDICATIONS:  Current Outpatient Medications  Medication Sig Dispense Refill  . acetaminophen (TYLENOL) 325 MG tablet Take 650 mg by mouth 2 (two) times daily as needed for mild pain or headache.    . Cholecalciferol 125 MCG (5000 UT) TABS Take 1 tablet by mouth daily.    Marland Kitchen ELIQUIS 5 MG TABS tablet Take 5 mg by mouth 2 (two) times daily.    . ferrous sulfate (FEROSUL) 325 (65 FE) MG tablet Take 1 tablet (325 mg total) by mouth 2 (two) times daily with a meal.    . fluticasone (FLONASE) 50 MCG/ACT nasal spray Place 1-2 sprays into both nostrils daily as needed for allergies or rhinitis.    . furosemide (LASIX) 40 MG tablet Take 1 tablet by mouth every Monday, Wednesday, and Friday.    . irbesartan (AVAPRO) 150 MG tablet Take 150 mg by mouth daily.    . meclizine (ANTIVERT) 25 MG tablet Take 1 tablet (25 mg total) by mouth 3 (three) times daily as needed for dizziness. 30 tablet 0  . metoprolol tartrate (LOPRESSOR) 25 MG tablet Take 1 tablet (25 mg total) by mouth 2 (two) times daily. 180 tablet 3  . Multiple Vitamins-Minerals (PRESERVISION AREDS 2) CAPS Take  1 capsule by mouth 2 (two) times daily.    . pantoprazole (PROTONIX) 40 MG tablet Take 40 mg by mouth daily.    Marland Kitchen PARoxetine (PAXIL) 40 MG tablet Take 40 mg by mouth at bedtime.    . polycarbophil (FIBERCON) 625 MG tablet Take 1,250 mg by mouth at bedtime.     [name] Glycol-Propyl Glycol (SYSTANE OP) Place 2 drops into both eyes 2 (two) times daily as needed (dry eyes).     . Potassium Chloride ER 20 MEQ TBCR Take 1 tablet by mouth every Monday, Wednesday, and Friday. Take with furosemide.    . rosuvastatin (CRESTOR) 5 MG tablet TAKE 1 TABLET BY MOUTH  EVERY MORNING  (Patient taking differently: Take 5 mg by mouth daily. ) 90 tablet 3  . tamsulosin (FLOMAX) 0.4 MG CAPS capsule Take 0.4 mg by mouth at bedtime.   11  . amiodarone (PACERONE) 200 MG tablet Take 200 mg by mouth daily.    . magnesium oxide (MAG-OX) 400 MG tablet Take 1 tablet (400 mg total) by mouth 2 (two) times daily. 60 tablet 0  . Melatonin 10 MG TABS Take 1 tablet by mouth at bedtime as needed (Sleep).     No current facility-administered medications for this visit.    SURGICAL HISTORY:  Past Surgical History:  Procedure Laterality Date  . ATRIAL FIBRILLATION ABLATION N/A 02/11/2019   Procedure: ATRIAL FIBRILLATION ABLATION;  Surgeon: Constance Haw, MD;  Location: [facility];  Service: Cardiovascular;  Laterality: N/A;  . CARDIAC CATHETERIZATION    . CATARACT EXTRACTION, BILATERAL    . COLONOSCOPY WITH PROPOFOL N/A 04/17/2017   Procedure: COLONOSCOPY WITH PROPOFOL;  Surgeon: Yetta Flock, MD;  Location: [facility];  Service: Gastroenterology;  Laterality: N/A;  . ESOPHAGEAL DILATION  2017  . ESOPHAGOGASTRODUODENOSCOPY (EGD) WITH PROPOFOL N/A 04/17/2017   Procedure: ESOPHAGOGASTRODUODENOSCOPY (EGD) WITH PROPOFOL;  Surgeon: Yetta Flock, MD;  Location: [facility];  Service: Gastroenterology;  Laterality: N/A;  . EYE SURGERY    . FOOT NEUROMA SURGERY  2002  . LEFT HEART CATH AND CORONARY ANGIOGRAPHY N/A 01/16/2019   Procedure: LEFT HEART CATH AND CORONARY ANGIOGRAPHY;  Surgeon: Jettie Booze, MD;  Location: [facility];  Service: Cardiovascular;  Laterality: N/A;  . LEFT HEART CATHETERIZATION WITH CORONARY ANGIOGRAM Right 06/14/2011   20% LM, chronic occluded mid LAD, 50% ostial LCX, 20% mid RI, RCA with collaterals to mid LAD, mid 10% stenosis, EF 60% 06/14/11  . NASAL SINUS SURGERY    . SHOULDER ARTHROSCOPY  03/01/2011   Procedure: ARTHROSCOPY SHOULDER;  Surgeon: Ninetta Lights, MD;  Location: [facility];  Service: Orthopedics;   Laterality: Right;  arthroscopy shoulder decompression subacromial partial acromioplasty with coracoacromial release, distal claviculectomy, debridement of labrium  . SHOULDER ARTHROSCOPY  03/01/2011   Procedure: ARTHROSCOPY SHOULDER;  Surgeon: Ninetta Lights, MD;  Location: [facility];  Service: Orthopedics;  Laterality: Right;  arthroscopy shoulder decompression subacromial partial acromioplasty with coracoacromial release, distal claviculectomy, debridement of labrium  . TEE WITHOUT CARDIOVERSION N/A 03/19/2016   Procedure: TRANSESOPHAGEAL ECHOCARDIOGRAM (TEE);  Surgeon: Larey Dresser, MD;  Location: [facility];  Service: Cardiovascular;  Laterality: N/A;  . TOTAL KNEE ARTHROPLASTY Right 09/23/2013   Procedure: TOTAL KNEE ARTHROPLASTY;  Surgeon: Ninetta Lights, MD;  Location: [facility];  Service: Orthopedics;  Laterality: Right;  . TOTAL SHOULDER ARTHROPLASTY  06/28/2011   Procedure: TOTAL SHOULDER ARTHROPLASTY;  Surgeon: Ninetta Lights, MD;  Location: [facility];  Service: Orthopedics;  Laterality: Right;  . UVULOPALATOPHARYNGOPLASTY    . VIDEO ASSISTED THORACOSCOPY (VATS)/WEDGE RESECTION Right 06/02/2018   Procedure: VIDEO ASSISTED THORACOSCOPY (VATS)/WEDGE RESECTION with Lymph node disection and intercostal nerve block.;  Surgeon: [name], MD;  Location: [facility];  Service: Thoracic;  Laterality: Right;    REVIEW OF SYSTEMS:  A comprehensive review of systems was negative except for: Constitutional: positive for fatigue   PHYSICAL EXAMINATION: General appearance: alert, cooperative and no distress Head: Normocephalic, without obvious abnormality, atraumatic Neck: no adenopathy, no JVD, supple, symmetrical, trachea midline and thyroid not enlarged, symmetric, no tenderness/mass/nodules Lymph nodes: Cervical, supraclavicular, and axillary nodes normal. Resp: clear to auscultation bilaterally Back: symmetric, no curvature. ROM normal. No CVA  tenderness. Cardio: regular rate and rhythm, S1, S2 normal, no murmur, click, rub or gallop GI: soft, non-tender; bowel sounds normal; no masses,  no organomegaly Extremities: extremities normal, atraumatic, no cyanosis or edema  ECOG PERFORMANCE STATUS: 1 - Symptomatic but completely ambulatory  Blood pressure 94/75, pulse 72, temperature (!) 97.3 F (36.3 C), temperature source Temporal, resp. rate 17, height 5\' 9"  (1.753 m), weight 215 lb 3.2 oz (97.6 kg), SpO2 96 %.  LABORATORY DATA: Lab Results  Component Value Date   WBC 7.6 08/20/2019   HGB 13.5 08/20/2019   HCT 41.9 08/20/2019   MCV 99.8 08/20/2019   PLT 202 08/20/2019      Chemistry      Component Value Date/Time   NA 139 08/20/2019 1029   NA 139 02/24/2019 1246   K 3.8 08/20/2019 1029   CL 104 08/20/2019 1029   CO2 27 08/20/2019 1029   BUN 17 08/20/2019 1029   BUN 18 02/24/2019 1246   CREATININE 1.03 08/20/2019 1029      Component Value Date/Time   CALCIUM 8.9 08/20/2019 1029   ALKPHOS 183 (H) 08/20/2019 1029   AST 36 08/20/2019 1029   ALT 49 (H) 08/20/2019 1029   BILITOT 0.8 08/20/2019 1029       RADIOGRAPHIC STUDIES: CT Head Wo Contrast  Addendum Date: 08/03/2019   ADDENDUM REPORT: 08/03/2019 02:17 ADDENDUM: These results were called by telephone at the time of interpretation on 08/03/2019 at 2:17 am to provider Dr. Roxanne Mins, who verbally acknowledged these results. Electronically Signed   By: Lovena Le M.D.   On: 08/03/2019 02:17   Result Date: 08/03/2019 CLINICAL DATA:  Repeat CT head for bleeding EXAM: CT HEAD WITHOUT CONTRAST TECHNIQUE: Contiguous axial images were obtained from the base of the skull through the vertex without intravenous contrast. COMPARISON:  CT 08/02/2019 FINDINGS: Brain: Redemonstration of the trace parafalcine hemorrhage well as small amount of subarachnoid hemorrhage along the mesial right frontal lobe not significantly changed from the comparison exam. Additional hemorrhage  previously seen in the left frontal sulcus is less convincing on this examination. There is a punctate focus of hyperdensity in the left parietal lobe (4/27) which may have been beyond the resolution of imaging on comparison. No new sites of intracranial hemorrhage identified. Stable hypoattenuating foci in the bilateral basal ganglia may reflect sequela of prior lacunar infarct. No CT evidence of acute large vascular territory or cortically based infarct. No mass effect or midline shift. Patchy areas of white matter hypoattenuation are most compatible with chronic microvascular angiopathy. Patchy areas of white matter hypoattenuation are most compatible with chronic microvascular angiopathy. Vascular: Atherosclerotic calcification of the carotid siphons and intradural vertebral arteries. No hyperdense vessel. Skull: Left occipital-parietal swelling and  hematoma is slightly redistributed from the comparison exam. No subjacent calvarial fracture. Sinuses/Orbits: Thickening and noted throughout the ethmoids, frontal, left sphenoid and maxillary sinuses. Small amount of layering fluid in the left maxillary sinus. Left concha bullosa. Orbital structures are unremarkable aside from prior lens extractions. Other: None IMPRESSION: 1. Redemonstration of the trace parafalcine hemorrhage as well as small amount of subarachnoid hemorrhage along the mesial right frontal lobe. Hemorrhage previously seen in the left frontal sulcus is less convincing on this examination. 2. There is a punctate focus of hyperdensity in the left parietal lobe which may have been beyond the resolution of imaging on comparison. No other new sites of intracranial hemorrhage identified. 3. Left occipital-parietal swelling and hematoma is slightly redistributed from the comparison exam. No subjacent calvarial fracture. 4. Diffuse mural disease of sinuses, possibly acute on chronic sinusitis. Correlate with symptoms or for recent instrumentation.  Currently attempting to contact the ordering provider with a critical value result. Addendum will be submitted upon case discussion. Electronically Signed: By: Lovena Le M.D. On: 08/03/2019 02:01   CT Head Wo Contrast  Result Date: 08/02/2019 CLINICAL DATA:  Fall. Blood thinners. Laceration to posterior head. EXAM: CT HEAD WITHOUT CONTRAST TECHNIQUE: Contiguous axial images were obtained from the base of the skull through the vertex without intravenous contrast. COMPARISON:  August 26, 2013 FINDINGS: Brain: There is a small amount of blood along the falx as seen on axial image 74 and coronal image 30. There is a small amount of blood to the right of the falx, probably subarachnoid in location on coronal image 28 and axial image 24. There appears to be a small amount of blood in a left frontal sulcus on axial image 25. No other intracranial blood identified. Cerebellum, brainstem, and basal cisterns are normal. Ventricles are prominent but stable. No mass effect or midline shift is noted. White matter changes are mild. No acute cortical ischemia or infarct. Vascular: Calcified atherosclerosis is seen in the intracranial carotids. Skull: Normal. Negative for fracture or focal lesion. Sinuses/Orbits: Mucosal thickening in the frontal, ethmoid, sphenoid, and maxillary sinuses is mild. Other: There is a hematoma over the left posterior scalp. Extracranial soft tissues are otherwise normal. IMPRESSION: 1. A small amount of subdural blood is seen along the left side of the falx. A small amount of subarachnoid hemorrhage is seen in the right frontal and left frontal regions. 2. Large hematoma over the posterior scalp. 3. No other acute abnormalities. Findings called to Dr. Reather Converse. Electronically Signed   By: Dorise Bullion III M.D   On: 08/02/2019 20:40   MR BRAIN W WO CONTRAST  Result Date: 08/01/2019 CLINICAL DATA:  Non-small cell lung cancer, staging. EXAM: MRI HEAD WITHOUT AND WITH CONTRAST TECHNIQUE:  Multiplanar, multiecho pulse sequences of the brain and surrounding structures were obtained without and with intravenous contrast. CONTRAST:  89mL GADAVIST GADOBUTROL 1 MMOL/ML IV SOLN COMPARISON:  05/07/2017 MRI head. FINDINGS: Brain: Moderate diffuse parenchymal volume loss with ex vacuo dilatation. Mild chronic microvascular ischemic changes. Remote right basal ganglia and left corona radiata lacunar infarcts. No abnormal enhancement. No acute infarct. No midline shift or extra-axial fluid collection. No intracranial hemorrhage. Vascular: Chronic left vertebral artery occlusion. Otherwise normal intravascular enhancement. Skull and upper cervical spine: Normal marrow signal. Sinuses/Orbits: Sequela of bilateral lens replacement. Mild pansinus mucosal thickening. No mastoid effusion. Other: None. IMPRESSION: No evidence of intracranial metastases. Moderate cerebral atrophy and mild chronic microvascular ischemic changes. Remote right basal ganglia and left corona radiata infarcts.  Chronic left vertebral artery occlusion. Electronically Signed   By: Primitivo Gauze M.D.   On: 08/01/2019 10:42   NM PET Image Restag (PS) Skull Base To Thigh  Result Date: 07/22/2019 CLINICAL DATA:  Subsequent treatment strategy for non-small cell lung cancer. COVID vaccination is in the RIGHT or EXAM: NUCLEAR MEDICINE PET SKULL BASE TO THIGH TECHNIQUE: 11.4 mCi F-18 FDG was injected intravenously. Full-ring PET imaging was performed from the skull base to thigh after the radiotracer. CT data was obtained and used for attenuation correction and anatomic localization. Fasting blood glucose: 107 mg/dl COMPARISON:  PET-CT 05/08/2018, CT 07/06/2019 FINDINGS: Mediastinal blood pool activity: SUV max 3.4 Liver activity: SUV max NA NECK: No hypermetabolic lymph nodes in the neck. Incidental CT findings: none CHEST: Hypermetabolic subcarinal lymph node measuring 2.5 cm short axis with SUV max equal 53.6 Hypermetabolic RIGHT hilar  lymph node measures approximately 2 cm with SUV max equal 14.4 No hypermetabolic pulmonary nodules. Interval resection of the RIGHT lower lobe nodule. Incidental CT findings: Postsurgical change in the RIGHT lower lobe. Coronary artery calcification and aortic atherosclerotic calcification. ABDOMEN/PELVIS: Evidence of multifocal hepatic metastasis. There several discrete hypermetabolic small lesions in the RIGHT hepatic lobe. There approximately 6 lesions in the RIGHT hepatic lobe. For example 1 cm lesion on image 104 with SUV max equal 10.7 posterior RIGHT hepatic lobe. Lesion the dome liver measures 1 cm or less with SUV max equal 6.3. Similar lesions on image 102. Adrenal glands are normal. No hypermetabolic adenopathy in the abdomen pelvis. Incidental CT findings: Sigmoid diverticulosis. Atherosclerotic calcification of the aorta. SKELETON: No aggressive osseous lesion. Bilateral shoulder prosthetic Incidental CT findings: none IMPRESSION: 1. Metastatic adenopathy in the mediastinum with large hypermetabolic subcarinal and RIGHT hilar lymph nodes. 2. Postsurgical change in RIGHT lower lobe without evidence local recurrence. 3. Unfortunately evidence of multifocal hepatic metastasis with multiple small hypermetabolic lesions in the RIGHT hepatic lobe. Electronically Signed   By: Suzy Bouchard M.D.   On: 07/22/2019 09:00   [place] CORE BIOPSY (LIVER)  Result Date: 08/18/2019 INDICATION: LUNG CANCER, LIVER METASTASES EXAM: ULTRASOUND RIGHT LIVER METASTASIS 18 GAUGE CORE BIOPSY MEDICATIONS: 1% LIDOCAINE ANESTHESIA/SEDATION: Moderate (conscious) sedation was employed during this procedure. A total of Versed 1.0 mg and Fentanyl 50 mcg was administered intravenously. Moderate Sedation Time: 10 minutes. The patient's level of consciousness and vital signs were monitored continuously by radiology nursing throughout the procedure under my direct supervision. FLUOROSCOPY TIME:  Fluoroscopy Time: NONE. COMPLICATIONS: None  immediate. PROCEDURE: Informed written consent was obtained from the patient after a thorough discussion of the procedural risks, benefits and alternatives. All questions were addressed. Maximal Sterile Barrier Technique was utilized including caps, mask, sterile gowns, sterile gloves, sterile drape, hand hygiene and skin antiseptic. A timeout was performed prior to the initiation of the procedure. previous imaging reviewed. patient positioned slightly right anterior oblique. Preliminary ultrasound performed. small hypoechoic lesions present in the right lobe. a peripheral lesion was marked a lower intercostal space in the mid axillary line. under sterile conditions and local anesthesia, a 17 gauge coaxial guide needles advanced to a hypoechoic lesion. needle position confirmed with ultrasound. images obtained for documentation. 3 18 gauge core biopsies obtained. samples were intact and fragmented. these were placed in formalin. needle tract occluded with gel-foam. postprocedure imaging demonstrates no hemorrhage or hematoma. patient tolerated biopsy well. IMPRESSION: Successful ultrasound right liver metastasis 18 gauge core biopsy Electronically Signed   By: Jerilynn Mages.  Shick M.D.   On: 08/18/2019 14:51  ASSESSMENT AND PLAN: This is a very pleasant 79 years old white male with a stage IB non-small cell lung cancer, squamous cell carcinoma status post superior segmentectomy of the right lower lobe with lymph node dissection in March 2020 under the care of Dr. Servando Snare. The tumor size was 2.7 cm with visceropleural involvement. The patient was found on the recent CT scan of the chest to have suspicious mediastinal lymphadenopathy.  I ordered a PET scan which was performed recently.  I personally and independently reviewed the PET scan images and discussed the result and showed the images to the patient and his wife. His PET scan showed hypermetabolic activity in the mediastinal lymph nodes including a large  subcarinal and right hilar lymph nodes in addition to multifocal hepatic metastasis with multiple small hypermetabolic lesions in the right hepatic lobe. The patient underwent ultrasound-guided core biopsy of the liver lesion and the final pathology is still pending.  He also has MRI of the brain that showed no concerning findings for brain metastasis. I recommended for the patient to come back for follow-up visit in 1 week for more discussion of his treatment options based on the final pathology. The patient was advised to call immediately if he has any concerning symptoms in the interval. The patient voices understanding of current disease status and treatment options and is in agreement with the current care plan.  All questions were answered. The patient knows to call the clinic with any problems, questions or concerns. We can certainly see the patient much sooner if necessary.  Disclaimer: This note was dictated with voice recognition software. Similar sounding words can inadvertently be transcribed and may not be corrected upon review.

## 2019-08-24 ENCOUNTER — Encounter: Payer: Self-pay | Admitting: Internal Medicine

## 2019-08-24 LAB — SURGICAL PATHOLOGY

## 2019-08-25 ENCOUNTER — Telehealth: Payer: Self-pay | Admitting: Internal Medicine

## 2019-08-25 NOTE — Telephone Encounter (Signed)
Called and spoke with patient. Confirmed appt  °

## 2019-08-26 ENCOUNTER — Other Ambulatory Visit: Payer: Self-pay | Admitting: Physician Assistant

## 2019-08-26 DIAGNOSIS — C3491 Malignant neoplasm of unspecified part of right bronchus or lung: Secondary | ICD-10-CM

## 2019-08-27 ENCOUNTER — Other Ambulatory Visit: Payer: Self-pay

## 2019-08-27 ENCOUNTER — Inpatient Hospital Stay: Payer: Medicare Other

## 2019-08-27 ENCOUNTER — Inpatient Hospital Stay (HOSPITAL_BASED_OUTPATIENT_CLINIC_OR_DEPARTMENT_OTHER): Payer: Medicare Other | Admitting: Internal Medicine

## 2019-08-27 VITALS — BP 106/59 | HR 70 | Temp 97.9°F | Resp 17 | Ht 69.0 in | Wt 211.2 lb

## 2019-08-27 DIAGNOSIS — R5382 Chronic fatigue, unspecified: Secondary | ICD-10-CM

## 2019-08-27 DIAGNOSIS — Z5112 Encounter for antineoplastic immunotherapy: Secondary | ICD-10-CM

## 2019-08-27 DIAGNOSIS — Z5111 Encounter for antineoplastic chemotherapy: Secondary | ICD-10-CM | POA: Diagnosis not present

## 2019-08-27 DIAGNOSIS — C7A1 Malignant poorly differentiated neuroendocrine tumors: Secondary | ICD-10-CM | POA: Diagnosis not present

## 2019-08-27 DIAGNOSIS — Z7189 Other specified counseling: Secondary | ICD-10-CM

## 2019-08-27 DIAGNOSIS — C3491 Malignant neoplasm of unspecified part of right bronchus or lung: Secondary | ICD-10-CM

## 2019-08-27 LAB — CBC WITH DIFFERENTIAL (CANCER CENTER ONLY)
Abs Immature Granulocytes: 0.03 10*3/uL (ref 0.00–0.07)
Basophils Absolute: 0.1 10*3/uL (ref 0.0–0.1)
Basophils Relative: 1 %
Eosinophils Absolute: 0.8 10*3/uL — ABNORMAL HIGH (ref 0.0–0.5)
Eosinophils Relative: 9 %
HCT: 43.3 % (ref 39.0–52.0)
Hemoglobin: 13.9 g/dL (ref 13.0–17.0)
Immature Granulocytes: 0 %
Lymphocytes Relative: 18 %
Lymphs Abs: 1.7 10*3/uL (ref 0.7–4.0)
MCH: 31.2 pg (ref 26.0–34.0)
MCHC: 32.1 g/dL (ref 30.0–36.0)
MCV: 97.3 fL (ref 80.0–100.0)
Monocytes Absolute: 0.7 10*3/uL (ref 0.1–1.0)
Monocytes Relative: 8 %
Neutro Abs: 5.9 10*3/uL (ref 1.7–7.7)
Neutrophils Relative %: 64 %
Platelet Count: 232 10*3/uL (ref 150–400)
RBC: 4.45 MIL/uL (ref 4.22–5.81)
RDW: 13.6 % (ref 11.5–15.5)
WBC Count: 9.3 10*3/uL (ref 4.0–10.5)
nRBC: 0 % (ref 0.0–0.2)

## 2019-08-27 LAB — CMP (CANCER CENTER ONLY)
ALT: 60 U/L — ABNORMAL HIGH (ref 0–44)
AST: 48 U/L — ABNORMAL HIGH (ref 15–41)
Albumin: 3.5 g/dL (ref 3.5–5.0)
Alkaline Phosphatase: 199 U/L — ABNORMAL HIGH (ref 38–126)
Anion gap: 9 (ref 5–15)
BUN: 23 mg/dL (ref 8–23)
CO2: 28 mmol/L (ref 22–32)
Calcium: 9.4 mg/dL (ref 8.9–10.3)
Chloride: 102 mmol/L (ref 98–111)
Creatinine: 1.39 mg/dL — ABNORMAL HIGH (ref 0.61–1.24)
GFR, Est AFR Am: 56 mL/min — ABNORMAL LOW (ref >60)
GFR, Estimated: 48 mL/min — ABNORMAL LOW (ref >60)
Glucose, Bld: 164 mg/dL — ABNORMAL HIGH (ref 70–99)
Potassium: 4.2 mmol/L (ref 3.5–5.1)
Sodium: 139 mmol/L (ref 135–145)
Total Bilirubin: 0.9 mg/dL (ref 0.3–1.2)
Total Protein: 6.6 g/dL (ref 6.5–8.1)

## 2019-08-27 NOTE — Progress Notes (Signed)
START ON PATHWAY REGIMEN - Small Cell Lung     Cycles 1 through 4: A cycle is every 21 days:     Durvalumab      Carboplatin      Etoposide    Cycles 5 and beyond: A cycle is every 28 days:     Durvalumab   **Always confirm dose/schedule in your pharmacy ordering system**  Patient Characteristics: Newly Diagnosed, Preoperative or Nonsurgical Candidate (Clinical Staging), First Line, Extensive Stage Therapeutic Status: Newly Diagnosed, Preoperative or Nonsurgical Candidate (Clinical Staging) AJCC T Category: cT2a AJCC N Category: cN3 AJCC M Category: cM1c AJCC 8 Stage Grouping: IVB Stage Classification: Extensive  Intent of Therapy: Non-Curative / Palliative Intent, Discussed with Patient

## 2019-08-27 NOTE — Progress Notes (Signed)
[name] [phone]   [phone]  OFFICE PROGRESS NOTE  [name], MD [place] [address]  DIAGNOSIS: Metastatic non-small cell lung cancer, poorly differentiated carcinoma with neuroendocrine features diagnosed in June 2021 initially diagnosed as stage IB (T2a, N0, M0) non-small cell lung cancer, squamous cell carcinoma presented with right lower lobe lung nodule diagnosed in February 2020.  PRIOR THERAPY: Right VATS with superior segmentectomy on the right lower lobe with lymph node dissection under the care of Dr. Servando Snare on June 02, 2018.  CURRENT THERAPY: Observation.  INTERVAL HISTORY: George Vega 79 y.o. male returns to the clinic today for follow-up visit accompanied by his wife. The patient is feeling fine today with no concerning complaints except for fatigue and generalized weakness in addition to anxiety about his biopsy results. He denied having any current chest pain, shortness breath, cough or hemoptysis. He continues to have mild nausea with no significant vomiting, diarrhea or constipation. He denied having any fever or chills. He has no headache or visual changes. He had ultrasound-guided core biopsy of one of the metastatic liver lesion and unfortunately the final diagnosis was consistent with poorly differentiated carcinoma with neuroendocrine features. The patient is here today for evaluation and discussion of his treatment options based on the final pathology.  MEDICAL HISTORY: Past Medical History:  Diagnosis Date  . Arthritis   . Atrial fibrillation, persistent ([facility])   . Cancer ([facility])    liver  . CHF (congestive heart failure) ([facility])   . Chronic sinusitis   . Coronary artery disease   . Depression   . Difficult intubation    was told with shoulder done 2006-alittle narrow  . 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 Hypertension   . Jejunostomy tube fell out    when asked about this in 04/2017, pt  denied ever having a J tube, feeding tube, tubes placed post surgery so ??? veracity of a previous J tube.    . 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 Spastic colon   . Squamous cell carcinoma of lung, stage I, right ([facility]) 05/07/2018   bx 04/29/18; isolated PET uptake in RLL mass  . Stroke (cerebrum) ([facility])   . SVT (supraventricular tachycardia) ([facility])     ALLERGIES:  is allergic to glucosamine-chondroitin, tetanus toxoid, fish oil, ibuprofen, naproxen, glucosamine-chondroitin, ibuprofen, naproxen, and tetanus toxoids.  MEDICATIONS:  Current Outpatient Medications  Medication Sig Dispense Refill  . acetaminophen (TYLENOL) 325 MG tablet Take 650 mg by mouth 2 (two) times daily as needed for mild pain or headache.    Marland Kitchen amiodarone (PACERONE) 200 MG tablet Take 200 mg by mouth daily.    . Cholecalciferol 125 MCG (5000 UT) TABS Take 1 tablet by mouth daily.    Marland Kitchen ELIQUIS 5 MG TABS tablet Take 5 mg by mouth 2 (two) times daily.    . ferrous sulfate (FEROSUL) 325 (65 FE) MG tablet Take 1 tablet (325 mg total) by mouth 2 (two) times daily with a meal.    . fluticasone (FLONASE) 50 MCG/ACT nasal spray Place 1-2 sprays into both nostrils daily as needed for allergies or rhinitis.    . furosemide (LASIX) 40 MG tablet Take 1 tablet by mouth every Monday, Wednesday, and Friday.    . irbesartan (AVAPRO) 150 MG tablet Take 150 mg by mouth daily.    . magnesium oxide (MAG-OX) 400 MG tablet Take 1 tablet (400 mg total)  by mouth 2 (two) times daily. 60 tablet 0  . Melatonin 10 MG TABS Take 1 tablet by mouth at bedtime as needed (Sleep).    . metoprolol tartrate (LOPRESSOR) 25 MG tablet Take 1 tablet (25 mg total) by mouth 2 (two) times daily. 180 tablet 3  . Multiple Vitamins-Minerals (PRESERVISION AREDS 2) CAPS Take 1 capsule by mouth 2 (two) times daily.    . pantoprazole (PROTONIX) 40 MG tablet Take 40 mg by mouth daily.    Marland Kitchen PARoxetine (PAXIL) 40 MG tablet Take 40 mg by mouth at  bedtime.    . polycarbophil (FIBERCON) 625 MG tablet Take 1,250 mg by mouth at bedtime.     [name] Glycol-Propyl Glycol (SYSTANE OP) Place 2 drops into both eyes 2 (two) times daily as needed (dry eyes).     . Potassium Chloride ER 20 MEQ TBCR Take 1 tablet by mouth every Monday, Wednesday, and Friday. Take with furosemide.    . rosuvastatin (CRESTOR) 5 MG tablet TAKE 1 TABLET BY MOUTH  EVERY MORNING (Patient taking differently: Take 5 mg by mouth daily. ) 90 tablet 3  . tamsulosin (FLOMAX) 0.4 MG CAPS capsule Take 0.4 mg by mouth at bedtime.   11  . meclizine (ANTIVERT) 25 MG tablet Take 1 tablet (25 mg total) by mouth 3 (three) times daily as needed for dizziness. (Patient not taking: Reported on 08/27/2019) 30 tablet 0   No current facility-administered medications for this visit.    SURGICAL HISTORY:  Past Surgical History:  Procedure Laterality Date  . ATRIAL FIBRILLATION ABLATION N/A 02/11/2019   Procedure: ATRIAL FIBRILLATION ABLATION;  Surgeon: Constance Haw, MD;  Location: [facility];  Service: Cardiovascular;  Laterality: N/A;  . CARDIAC CATHETERIZATION    . CATARACT EXTRACTION, BILATERAL    . COLONOSCOPY WITH PROPOFOL N/A 04/17/2017   Procedure: COLONOSCOPY WITH PROPOFOL;  Surgeon: Yetta Flock, MD;  Location: [facility];  Service: Gastroenterology;  Laterality: N/A;  . ESOPHAGEAL DILATION  2017  . ESOPHAGOGASTRODUODENOSCOPY (EGD) WITH PROPOFOL N/A 04/17/2017   Procedure: ESOPHAGOGASTRODUODENOSCOPY (EGD) WITH PROPOFOL;  Surgeon: Yetta Flock, MD;  Location: [facility];  Service: Gastroenterology;  Laterality: N/A;  . EYE SURGERY    . FOOT NEUROMA SURGERY  2002  . LEFT HEART CATH AND CORONARY ANGIOGRAPHY N/A 01/16/2019   Procedure: LEFT HEART CATH AND CORONARY ANGIOGRAPHY;  Surgeon: Jettie Booze, MD;  Location: [facility];  Service: Cardiovascular;  Laterality: N/A;  . LEFT HEART CATHETERIZATION WITH CORONARY ANGIOGRAM Right  06/14/2011   20% LM, chronic occluded mid LAD, 50% ostial LCX, 20% mid RI, RCA with collaterals to mid LAD, mid 10% stenosis, EF 60% 06/14/11  . NASAL SINUS SURGERY    . SHOULDER ARTHROSCOPY  03/01/2011   Procedure: ARTHROSCOPY SHOULDER;  Surgeon: Ninetta Lights, MD;  Location: [facility];  Service: Orthopedics;  Laterality: Right;  arthroscopy shoulder decompression subacromial partial acromioplasty with coracoacromial release, distal claviculectomy, debridement of labrium  . SHOULDER ARTHROSCOPY  03/01/2011   Procedure: ARTHROSCOPY SHOULDER;  Surgeon: Ninetta Lights, MD;  Location: [facility];  Service: Orthopedics;  Laterality: Right;  arthroscopy shoulder decompression subacromial partial acromioplasty with coracoacromial release, distal claviculectomy, debridement of labrium  . TEE WITHOUT CARDIOVERSION N/A 03/19/2016   Procedure: TRANSESOPHAGEAL ECHOCARDIOGRAM (TEE);  Surgeon: Larey Dresser, MD;  Location: [facility];  Service: Cardiovascular;  Laterality: N/A;  . TOTAL KNEE ARTHROPLASTY Right 09/23/2013   Procedure: TOTAL KNEE ARTHROPLASTY;  Surgeon: Ninetta Lights, MD;  Location: [facility];  Service: Orthopedics;  Laterality: Right;  . TOTAL SHOULDER ARTHROPLASTY  06/28/2011   Procedure: TOTAL SHOULDER ARTHROPLASTY;  Surgeon: Ninetta Lights, MD;  Location: [facility];  Service: Orthopedics;  Laterality: Right;  . UVULOPALATOPHARYNGOPLASTY    . VIDEO ASSISTED THORACOSCOPY (VATS)/WEDGE RESECTION Right 06/02/2018   Procedure: VIDEO ASSISTED THORACOSCOPY (VATS)/WEDGE RESECTION with Lymph node disection and intercostal nerve block.;  Surgeon: [name], MD;  Location: [facility];  Service: Thoracic;  Laterality: Right;    REVIEW OF SYSTEMS:  Constitutional: positive for fatigue and weight loss Eyes: negative Ears, nose, mouth, throat, and face: negative Respiratory: negative Cardiovascular: negative Gastrointestinal: positive for  nausea Genitourinary:negative Integument/breast: negative Hematologic/lymphatic: negative Musculoskeletal:negative Neurological: negative Behavioral/Psych: negative Endocrine: negative Allergic/Immunologic: negative   PHYSICAL EXAMINATION: General appearance: alert, cooperative, fatigued and no distress Head: Normocephalic, without obvious abnormality, atraumatic Neck: no adenopathy, no JVD, supple, symmetrical, trachea midline and thyroid not enlarged, symmetric, no tenderness/mass/nodules Lymph nodes: Cervical, supraclavicular, and axillary nodes normal. Resp: clear to auscultation bilaterally Back: symmetric, no curvature. ROM normal. No CVA tenderness. Cardio: regular rate and rhythm, S1, S2 normal, no murmur, click, rub or gallop GI: soft, non-tender; bowel sounds normal; no masses,  no organomegaly Extremities: extremities normal, atraumatic, no cyanosis or edema Neurologic: Alert and oriented X 3, normal strength and tone. Normal symmetric reflexes. Normal coordination and gait  ECOG PERFORMANCE STATUS: 1 - Symptomatic but completely ambulatory  Blood pressure (!) 106/59, pulse 70, temperature 97.9 F (36.6 C), temperature source Temporal, resp. rate 17, height 5\' 9"  (1.753 m), weight 211 lb 3.2 oz (95.8 kg), SpO2 94 %.  LABORATORY DATA: Lab Results  Component Value Date   WBC 9.3 08/27/2019   HGB 13.9 08/27/2019   HCT 43.3 08/27/2019   MCV 97.3 08/27/2019   PLT 232 08/27/2019      Chemistry      Component Value Date/Time   NA 139 08/27/2019 1031   NA 139 02/24/2019 1246   K 4.2 08/27/2019 1031   CL 102 08/27/2019 1031   CO2 28 08/27/2019 1031   BUN 23 08/27/2019 1031   BUN 18 02/24/2019 1246   CREATININE 1.39 (H) 08/27/2019 1031      Component Value Date/Time   CALCIUM 9.4 08/27/2019 1031   ALKPHOS 199 (H) 08/27/2019 1031   AST 48 (H) 08/27/2019 1031   ALT 60 (H) 08/27/2019 1031   BILITOT 0.9 08/27/2019 1031       RADIOGRAPHIC STUDIES: CT Head Wo  Contrast  Addendum Date: 08/03/2019   ADDENDUM REPORT: 08/03/2019 02:17 ADDENDUM: These results were called by telephone at the time of interpretation on 08/03/2019 at 2:17 am to provider Dr. Roxanne Mins, who verbally acknowledged these results. Electronically Signed   By: Lovena Le M.D.   On: 08/03/2019 02:17   Result Date: 08/03/2019 CLINICAL DATA:  Repeat CT head for bleeding EXAM: CT HEAD WITHOUT CONTRAST TECHNIQUE: Contiguous axial images were obtained from the base of the skull through the vertex without intravenous contrast. COMPARISON:  CT 08/02/2019 FINDINGS: Brain: Redemonstration of the trace parafalcine hemorrhage well as small amount of subarachnoid hemorrhage along the mesial right frontal lobe not significantly changed from the comparison exam. Additional hemorrhage previously seen in the left frontal sulcus is less convincing on this examination. There is a punctate focus of hyperdensity in the left parietal lobe (4/27) which may have been beyond the resolution of imaging on comparison. No new sites of intracranial  hemorrhage identified. Stable hypoattenuating foci in the bilateral basal ganglia may reflect sequela of prior lacunar infarct. No CT evidence of acute large vascular territory or cortically based infarct. No mass effect or midline shift. Patchy areas of white matter hypoattenuation are most compatible with chronic microvascular angiopathy. Patchy areas of white matter hypoattenuation are most compatible with chronic microvascular angiopathy. Vascular: Atherosclerotic calcification of the carotid siphons and intradural vertebral arteries. No hyperdense vessel. Skull: Left occipital-parietal swelling and hematoma is slightly redistributed from the comparison exam. No subjacent calvarial fracture. Sinuses/Orbits: Thickening and noted throughout the ethmoids, frontal, left sphenoid and maxillary sinuses. Small amount of layering fluid in the left maxillary sinus. Left concha bullosa. Orbital  structures are unremarkable aside from prior lens extractions. Other: None IMPRESSION: 1. Redemonstration of the trace parafalcine hemorrhage as well as small amount of subarachnoid hemorrhage along the mesial right frontal lobe. Hemorrhage previously seen in the left frontal sulcus is less convincing on this examination. 2. There is a punctate focus of hyperdensity in the left parietal lobe which may have been beyond the resolution of imaging on comparison. No other new sites of intracranial hemorrhage identified. 3. Left occipital-parietal swelling and hematoma is slightly redistributed from the comparison exam. No subjacent calvarial fracture. 4. Diffuse mural disease of sinuses, possibly acute on chronic sinusitis. Correlate with symptoms or for recent instrumentation. Currently attempting to contact the ordering provider with a critical value result. Addendum will be submitted upon case discussion. Electronically Signed: By: Lovena Le M.D. On: 08/03/2019 02:01   CT Head Wo Contrast  Result Date: 08/02/2019 CLINICAL DATA:  Fall. Blood thinners. Laceration to posterior head. EXAM: CT HEAD WITHOUT CONTRAST TECHNIQUE: Contiguous axial images were obtained from the base of the skull through the vertex without intravenous contrast. COMPARISON:  August 26, 2013 FINDINGS: Brain: There is a small amount of blood along the falx as seen on axial image 74 and coronal image 30. There is a small amount of blood to the right of the falx, probably subarachnoid in location on coronal image 28 and axial image 24. There appears to be a small amount of blood in a left frontal sulcus on axial image 25. No other intracranial blood identified. Cerebellum, brainstem, and basal cisterns are normal. Ventricles are prominent but stable. No mass effect or midline shift is noted. White matter changes are mild. No acute cortical ischemia or infarct. Vascular: Calcified atherosclerosis is seen in the intracranial carotids. Skull:  Normal. Negative for fracture or focal lesion. Sinuses/Orbits: Mucosal thickening in the frontal, ethmoid, sphenoid, and maxillary sinuses is mild. Other: There is a hematoma over the left posterior scalp. Extracranial soft tissues are otherwise normal. IMPRESSION: 1. A small amount of subdural blood is seen along the left side of the falx. A small amount of subarachnoid hemorrhage is seen in the right frontal and left frontal regions. 2. Large hematoma over the posterior scalp. 3. No other acute abnormalities. Findings called to Dr. Reather Converse. Electronically Signed   By: Dorise Bullion III M.D   On: 08/02/2019 20:40   MR BRAIN W WO CONTRAST  Result Date: 08/01/2019 CLINICAL DATA:  Non-small cell lung cancer, staging. EXAM: MRI HEAD WITHOUT AND WITH CONTRAST TECHNIQUE: Multiplanar, multiecho pulse sequences of the brain and surrounding structures were obtained without and with intravenous contrast. CONTRAST:  44mL GADAVIST GADOBUTROL 1 MMOL/ML IV SOLN COMPARISON:  05/07/2017 MRI head. FINDINGS: Brain: Moderate diffuse parenchymal volume loss with ex vacuo dilatation. Mild chronic microvascular ischemic changes. Remote right basal  ganglia and left corona radiata lacunar infarcts. No abnormal enhancement. No acute infarct. No midline shift or extra-axial fluid collection. No intracranial hemorrhage. Vascular: Chronic left vertebral artery occlusion. Otherwise normal intravascular enhancement. Skull and upper cervical spine: Normal marrow signal. Sinuses/Orbits: Sequela of bilateral lens replacement. Mild pansinus mucosal thickening. No mastoid effusion. Other: None. IMPRESSION: No evidence of intracranial metastases. Moderate cerebral atrophy and mild chronic microvascular ischemic changes. Remote right basal ganglia and left corona radiata infarcts. Chronic left vertebral artery occlusion. Electronically Signed   By: Primitivo Gauze M.D.   On: 08/01/2019 10:42   [place] CORE BIOPSY (LIVER)  Result Date:  08/18/2019 INDICATION: LUNG CANCER, LIVER METASTASES EXAM: ULTRASOUND RIGHT LIVER METASTASIS 18 GAUGE CORE BIOPSY MEDICATIONS: 1% LIDOCAINE ANESTHESIA/SEDATION: Moderate (conscious) sedation was employed during this procedure. A total of Versed 1.0 mg and Fentanyl 50 mcg was administered intravenously. Moderate Sedation Time: 10 minutes. The patient's level of consciousness and vital signs were monitored continuously by radiology nursing throughout the procedure under my direct supervision. FLUOROSCOPY TIME:  Fluoroscopy Time: NONE. COMPLICATIONS: None immediate. PROCEDURE: Informed written consent was obtained from the patient after a thorough discussion of the procedural risks, benefits and alternatives. All questions were addressed. Maximal Sterile Barrier Technique was utilized including caps, mask, sterile gowns, sterile gloves, sterile drape, hand hygiene and skin antiseptic. A timeout was performed prior to the initiation of the procedure. previous imaging reviewed. patient positioned slightly right anterior oblique. Preliminary ultrasound performed. small hypoechoic lesions present in the right lobe. a peripheral lesion was marked a lower intercostal space in the mid axillary line. under sterile conditions and local anesthesia, a 17 gauge coaxial guide needles advanced to a hypoechoic lesion. needle position confirmed with ultrasound. images obtained for documentation. 3 18 gauge core biopsies obtained. samples were intact and fragmented. these were placed in formalin. needle tract occluded with gel-foam. postprocedure imaging demonstrates no hemorrhage or hematoma. patient tolerated biopsy well. IMPRESSION: Successful ultrasound right liver metastasis 18 gauge core biopsy Electronically Signed   By: Jerilynn Mages.  Shick M.D.   On: 08/18/2019 14:51    ASSESSMENT AND PLAN: This is a very pleasant 79 years old white male with metastatic non-small cell lung cancer, poorly differentiated carcinoma with neuroendocrine  features diagnosed in June 2021 initially diagnosed as stage IB non-small cell lung cancer, squamous cell carcinoma status post superior segmentectomy of the right lower lobe with lymph node dissection in March 2020 under the care of Dr. Servando Snare. The tumor size was 2.7 cm with visceropleural involvement. He was found on recent imaging studies to have evidence for disease metastasis in the liver as well as the lung but no evidence for metastatic disease to the brain. Ultrasound-guided core biopsy of one of the metastatic liver lesion was consistent with metastatic poorly differentiated carcinoma with neuroendocrine features. I had a lengthy discussion with the patient and his wife today about his current disease stage, prognosis and treatment options. I explained to the patient that he has incurable condition and all the treatment will be of palliative nature. He was given the option of palliative care and hospice referral versus consideration of palliative systemic chemotherapy with small cell regimen that usually the preferred regimen for patient with neuroendocrine features of the lung cancer. The patient is interested in proceeding with systemic chemotherapy and I recommended for him a regimen consisting of carboplatin for AUC of 5 on day 1 and etoposide 100 mg/M2 on days 1, 2 and 3 with Neulasta support as well as immunotherapy with  Imfinzi. I discussed with the patient the adverse effect of this treatment including but not limited to alopecia, myelosuppression, nausea and vomiting, peripheral neuropathy, liver or renal dysfunction as well as immunotherapy adverse effects. The patient is expected to start the first cycle of this treatment next week. I will arrange for the patient to have a chemotherapy education class before the first dose of his treatment. I will send a prescription for Compazine 10 mg p.o. every 6 hours as needed for nausea to his pharmacy. I will arrange for the patient to come  back for follow-up visit in 2 weeks for evaluation and management of any adverse effect of his treatment. The patient was advised to call immediately if he has any concerning symptoms in the interval. The patient voices understanding of current disease status and treatment options and is in agreement with the current care plan.  All questions were answered. The patient knows to call the clinic with any problems, questions or concerns. We can certainly see the patient much sooner if necessary.  Disclaimer: This note was dictated with voice recognition software. Similar sounding words can inadvertently be transcribed and may not be corrected upon review.

## 2019-08-28 ENCOUNTER — Encounter: Payer: Self-pay | Admitting: Internal Medicine

## 2019-08-28 DIAGNOSIS — Z7189 Other specified counseling: Secondary | ICD-10-CM | POA: Insufficient documentation

## 2019-08-28 DIAGNOSIS — Z5112 Encounter for antineoplastic immunotherapy: Secondary | ICD-10-CM | POA: Insufficient documentation

## 2019-08-28 DIAGNOSIS — Z5111 Encounter for antineoplastic chemotherapy: Secondary | ICD-10-CM | POA: Insufficient documentation

## 2019-08-28 NOTE — Progress Notes (Signed)
Pharmacist Chemotherapy Monitoring - Initial Assessment    Anticipated start date: 09/02/2019   Regimen:  . Are orders appropriate based on the patient's diagnosis, regimen, and cycle? Yes . Does the plan date match the patient's scheduled date? Yes . Is the sequencing of drugs appropriate? Yes . Are the premedications appropriate for the patient's regimen? Yes . Prior Authorization for treatment is: Approved o If applicable, is the correct biosimilar selected based on the patient's insurance? yes  Organ Function and Labs: Marland Kitchen Are dose adjustments needed based on the patient's renal function, hepatic function, or hematologic function? No . Are appropriate labs ordered prior to the start of patient's treatment? Yes . Other organ system assessment, if indicated: N/A . The following baseline labs, if indicated, have been ordered: durvalumab: baseline TSH +/- T4  Dose Assessment: . Are the drug doses appropriate? Yes . Are the following correct: o Drug concentrations Yes o IV fluid compatible with drug Yes o Administration routes Yes o Timing of therapy Yes . If applicable, does the patient have documented access for treatment and/or plans for port-a-cath placement? not applicable . If applicable, have lifetime cumulative doses been properly documented and assessed? not applicable Lifetime Dose Tracking  No doses have been documented on this patient for the following tracked chemicals: Doxorubicin, Epirubicin, Idarubicin, Daunorubicin, Mitoxantrone, Bleomycin, Oxaliplatin, Carboplatin, Liposomal Doxorubicin  o   Toxicity Monitoring/Prevention: . The patient has the following take home antiemetics prescribed: none documented on medication list - follow up patient has antiemetics for C1 . The patient has the following take home medications prescribed: CSFs for > 20% risk febrile neutropenia . Medication allergies and previous infusion related reactions, if applicable, have been reviewed and  addressed. Yes . The patient's current medication list has been assessed for drug-drug interactions with their chemotherapy regimen. no significant drug-drug interactions were identified on review.  Order Review: . Are the treatment plan orders signed? No . Is the patient scheduled to see a provider prior to their treatment? No  I verify that I have reviewed each item in the above checklist and answered each question accordingly.  Britt Boozer 08/28/2019 1:13 PM

## 2019-08-31 ENCOUNTER — Inpatient Hospital Stay: Payer: Medicare Other

## 2019-08-31 ENCOUNTER — Other Ambulatory Visit: Payer: Self-pay | Admitting: Physician Assistant

## 2019-08-31 ENCOUNTER — Other Ambulatory Visit: Payer: Self-pay

## 2019-08-31 DIAGNOSIS — C3491 Malignant neoplasm of unspecified part of right bronchus or lung: Secondary | ICD-10-CM

## 2019-08-31 MED ORDER — PROCHLORPERAZINE MALEATE 10 MG PO TABS: 10.0000 mg | ORAL_TABLET | Freq: Four times a day (QID) | ORAL | 2 refills | Status: AC | PRN

## 2019-09-02 ENCOUNTER — Emergency Department (HOSPITAL_COMMUNITY): Payer: Medicare Other

## 2019-09-02 ENCOUNTER — Inpatient Hospital Stay: Payer: Medicare Other

## 2019-09-02 ENCOUNTER — Encounter: Payer: Self-pay | Admitting: Internal Medicine

## 2019-09-02 ENCOUNTER — Encounter (HOSPITAL_COMMUNITY): Payer: Self-pay

## 2019-09-02 ENCOUNTER — Emergency Department (HOSPITAL_COMMUNITY)
Admission: EM | Admit: 2019-09-02 | Discharge: 2019-09-02 | Disposition: A | Discharge status: Home / Self Care | Payer: Medicare Other | Attending: Emergency Medicine | Admitting: Emergency Medicine

## 2019-09-02 ENCOUNTER — Encounter (HOSPITAL_COMMUNITY): Payer: Self-pay | Admitting: Internal Medicine

## 2019-09-02 ENCOUNTER — Other Ambulatory Visit: Payer: Self-pay | Admitting: Internal Medicine

## 2019-09-02 ENCOUNTER — Other Ambulatory Visit: Payer: Self-pay

## 2019-09-02 VITALS — BP 97/65 | HR 65 | Temp 97.5°F | Resp 18

## 2019-09-02 DIAGNOSIS — I10 Essential (primary) hypertension: Secondary | ICD-10-CM | POA: Diagnosis not present

## 2019-09-02 DIAGNOSIS — M25512 Pain in left shoulder: Secondary | ICD-10-CM | POA: Diagnosis not present

## 2019-09-02 DIAGNOSIS — Y939 Activity, unspecified: Secondary | ICD-10-CM | POA: Insufficient documentation

## 2019-09-02 DIAGNOSIS — W1809XA Striking against other object with subsequent fall, initial encounter: Secondary | ICD-10-CM | POA: Diagnosis not present

## 2019-09-02 DIAGNOSIS — Y999 Unspecified external cause status: Secondary | ICD-10-CM | POA: Insufficient documentation

## 2019-09-02 DIAGNOSIS — W19XXXA Unspecified fall, initial encounter: Secondary | ICD-10-CM

## 2019-09-02 DIAGNOSIS — Z5112 Encounter for antineoplastic immunotherapy: Secondary | ICD-10-CM | POA: Insufficient documentation

## 2019-09-02 DIAGNOSIS — Y92230 Patient room in hospital as the place of occurrence of the external cause: Secondary | ICD-10-CM | POA: Diagnosis not present

## 2019-09-02 DIAGNOSIS — S80212A Abrasion, left knee, initial encounter: Secondary | ICD-10-CM | POA: Insufficient documentation

## 2019-09-02 DIAGNOSIS — S0990XA Unspecified injury of head, initial encounter: Secondary | ICD-10-CM

## 2019-09-02 DIAGNOSIS — S90511A Abrasion, right ankle, initial encounter: Secondary | ICD-10-CM | POA: Diagnosis not present

## 2019-09-02 DIAGNOSIS — C7A1 Malignant poorly differentiated neuroendocrine tumors: Secondary | ICD-10-CM

## 2019-09-02 DIAGNOSIS — R5382 Chronic fatigue, unspecified: Secondary | ICD-10-CM

## 2019-09-02 DIAGNOSIS — C787 Secondary malignant neoplasm of liver and intrahepatic bile duct: Secondary | ICD-10-CM | POA: Insufficient documentation

## 2019-09-02 DIAGNOSIS — F17210 Nicotine dependence, cigarettes, uncomplicated: Secondary | ICD-10-CM | POA: Insufficient documentation

## 2019-09-02 DIAGNOSIS — C3491 Malignant neoplasm of unspecified part of right bronchus or lung: Secondary | ICD-10-CM | POA: Insufficient documentation

## 2019-09-02 LAB — CMP (CANCER CENTER ONLY)
ALT: 62 U/L — ABNORMAL HIGH (ref 0–44)
AST: 43 U/L — ABNORMAL HIGH (ref 15–41)
Albumin: 3.5 g/dL (ref 3.5–5.0)
Alkaline Phosphatase: 168 U/L — ABNORMAL HIGH (ref 38–126)
Anion gap: 9 (ref 5–15)
BUN: 16 mg/dL (ref 8–23)
CO2: 25 mmol/L (ref 22–32)
Calcium: 9.2 mg/dL (ref 8.9–10.3)
Chloride: 105 mmol/L (ref 98–111)
Creatinine: 1.07 mg/dL (ref 0.61–1.24)
GFR, Est AFR Am: >60 mL/min (ref >60)
GFR, Estimated: >60 mL/min (ref >60)
Glucose, Bld: 142 mg/dL — ABNORMAL HIGH (ref 70–99)
Potassium: 4.2 mmol/L (ref 3.5–5.1)
Sodium: 139 mmol/L (ref 135–145)
Total Bilirubin: 0.7 mg/dL (ref 0.3–1.2)
Total Protein: 6.7 g/dL (ref 6.5–8.1)

## 2019-09-02 LAB — CBC WITH DIFFERENTIAL (CANCER CENTER ONLY)
Abs Immature Granulocytes: 0.02 10*3/uL (ref 0.00–0.07)
Basophils Absolute: 0.1 10*3/uL (ref 0.0–0.1)
Basophils Relative: 1 %
Eosinophils Absolute: 0.5 10*3/uL (ref 0.0–0.5)
Eosinophils Relative: 7 %
HCT: 43.3 % (ref 39.0–52.0)
Hemoglobin: 14.2 g/dL (ref 13.0–17.0)
Immature Granulocytes: 0 %
Lymphocytes Relative: 15 %
Lymphs Abs: 1.1 10*3/uL (ref 0.7–4.0)
MCH: 32.2 pg (ref 26.0–34.0)
MCHC: 32.8 g/dL (ref 30.0–36.0)
MCV: 98.2 fL (ref 80.0–100.0)
Monocytes Absolute: 0.6 10*3/uL (ref 0.1–1.0)
Monocytes Relative: 9 %
Neutro Abs: 4.9 10*3/uL (ref 1.7–7.7)
Neutrophils Relative %: 68 %
Platelet Count: 182 10*3/uL (ref 150–400)
RBC: 4.41 MIL/uL (ref 4.22–5.81)
RDW: 13.4 % (ref 11.5–15.5)
WBC Count: 7.3 10*3/uL (ref 4.0–10.5)
nRBC: 0 % (ref 0.0–0.2)

## 2019-09-02 LAB — TSH: TSH: 1.565 u[IU]/mL (ref 0.320–4.118)

## 2019-09-02 MED ORDER — PALONOSETRON HCL INJECTION 0.25 MG/5ML
INTRAVENOUS | Status: AC
  Filled 2019-09-02: qty 5

## 2019-09-02 MED ORDER — SODIUM CHLORIDE 0.9 % IV SOLN
Freq: Once | INTRAVENOUS | Status: AC
  Filled 2019-09-02: qty 250

## 2019-09-02 MED ORDER — SODIUM CHLORIDE 0.9 % IV SOLN
510.0000 mg | Freq: Once | INTRAVENOUS | Status: AC
  Administered 2019-09-02: 510 mg via INTRAVENOUS
  Filled 2019-09-02: qty 51

## 2019-09-02 MED ORDER — SODIUM CHLORIDE 0.9% FLUSH
10.0000 mL | INTRAVENOUS | Status: DC | PRN
  Filled 2019-09-02: qty 10

## 2019-09-02 MED ORDER — SODIUM CHLORIDE 0.9 % IV SOLN
150.0000 mg | Freq: Once | INTRAVENOUS | Status: AC
  Administered 2019-09-02: 150 mg via INTRAVENOUS
  Filled 2019-09-02: qty 150

## 2019-09-02 MED ORDER — OXYCODONE-ACETAMINOPHEN 5-325 MG PO TABS: 1.0000 | ORAL_TABLET | Freq: Four times a day (QID) | ORAL | 0 refills | Status: AC | PRN

## 2019-09-02 MED ORDER — PALONOSETRON HCL INJECTION 0.25 MG/5ML
0.2500 mg | Freq: Once | INTRAVENOUS | Status: AC
  Administered 2019-09-02: 0.25 mg via INTRAVENOUS

## 2019-09-02 MED ORDER — SENNOSIDES-DOCUSATE SODIUM 8.6-50 MG PO TABS: 1.0000 | ORAL_TABLET | Freq: Every evening | ORAL | 0 refills | Status: AC | PRN

## 2019-09-02 MED ORDER — SODIUM CHLORIDE 0.9 % IV SOLN
1500.0000 mg | Freq: Once | INTRAVENOUS | Status: AC
  Administered 2019-09-02: 1500 mg via INTRAVENOUS
  Filled 2019-09-02: qty 30

## 2019-09-02 MED ORDER — SODIUM CHLORIDE 0.9 % IV SOLN
10.0000 mg | Freq: Once | INTRAVENOUS | Status: AC
  Administered 2019-09-02: 10 mg via INTRAVENOUS
  Filled 2019-09-02: qty 10

## 2019-09-02 MED ORDER — HEPARIN SOD (PORK) LOCK FLUSH 100 UNIT/ML IV SOLN
500.0000 [IU] | Freq: Once | INTRAVENOUS | Status: DC | PRN
  Filled 2019-09-02: qty 5

## 2019-09-02 MED ORDER — OXYCODONE-ACETAMINOPHEN 5-325 MG PO TABS
1.0000 | ORAL_TABLET | Freq: Once | ORAL | Status: AC
  Administered 2019-09-02: 1 via ORAL
  Filled 2019-09-02: qty 1

## 2019-09-02 MED ORDER — SODIUM CHLORIDE 0.9 % IV SOLN
100.0000 mg/m2 | Freq: Once | INTRAVENOUS | Status: AC
  Administered 2019-09-02: 220 mg via INTRAVENOUS
  Filled 2019-09-02: qty 11

## 2019-09-02 NOTE — Progress Notes (Signed)
Met with patient at registration to introduce myself as Financial Resource Specialist and to offer available resources. ° °Discussed one-time $1000 [facility] grant and qualifications to assist with personal expenses while going through treatment. ° °Gave him my card if interested in applying and for any additional financial questions or concerns.   °

## 2019-09-02 NOTE — ED Provider Notes (Signed)
Emergency Department Provider Note   I have reviewed the triage vital signs and the nursing notes.   HISTORY  Chief Complaint No chief complaint on file.   HPI George Vega is a 79 y.o. male with past medical history reviewed below including anticoagulation on A. fib presents with mechanical fall today.  Patient was at the cancer center receiving his first infusion when he tripped while getting out of the wheelchair.  The patient's wife states that he fell and did strike the back of his head.  No known loss of consciousness.  He primarily fell hurting his left shoulder which is where most of his pain is located.  He does have an abrasion to the left knee and right ankle but has been ambulatory since the fall.  No vomiting or confusion since. Pain in the shoulder is moderate to severe and worse with movement.   Past Medical History:  Diagnosis Date  . Arthritis   . Atrial fibrillation, persistent ([place])   . Cancer ([place])    liver  . CHF (congestive heart failure) ([place])   . Chronic sinusitis   . Coronary artery disease   . Depression   . Difficult intubation    was told with shoulder done 2006-alittle narrow  . 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 Hypertension   . Jejunostomy tube fell out    when asked about this in 04/2017, pt denied ever having a J tube, feeding tube, tubes placed post surgery so ??? veracity of a previous J tube.    . 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 Spastic colon   . 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 Stroke (cerebrum) ([place])   . SVT (supraventricular tachycardia) [facility])     Patient Active Problem List   Diagnosis Date Noted  . Encounter for antineoplastic chemotherapy 08/28/2019  . Encounter for antineoplastic immunotherapy 08/28/2019  . Goals of care, counseling/discussion 08/28/2019  . 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 Elevated troponin   . Acute respiratory failure ([place]) 02/14/2019  . H/O cardiac radiofrequency ablation 02/14/2019  . Acute on chronic diastolic CHF (congestive heart failure) ([place])   . Paroxysmal atrial fibrillation ([place]) 02/11/2019  . Lung cancer ([place]) 06/02/2018  . Prostate nodule 05/15/2018  . Squamous cell carcinoma of lung, stage I, right ([place]) 05/07/2018  . AVM (arteriovenous malformation) of colon without hemorrhage   . Gastric AVM   . Chronic anticoagulation   . Iron deficiency anemia due to chronic blood loss   . Symptomatic anemia 04/16/2017  . 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 Prediabetes 01/30/2017  . Vitamin D deficiency 01/30/2017  . Multiple pulmonary nodules determined by computed tomography of lung 10/09/2016  . Pulmonary nodules 10/09/2016  . Persistent atrial fibrillation ([place]) 03/19/2016  . Upper airway cough syndrome 01/21/2016  . Dyspnea on exertion 01/20/2016  . SVT (supraventricular tachycardia) ([place]) 11/01/2015  . Atrial tachycardia ([place]) 09/08/2015  . 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 BPH (benign prostatic hyperplasia) 05/19/2014  . CVA (cerebral vascular accident) ([place]) 02/01/2014  . Benign paroxysmal positional vertigo 01/12/2014  . Headache 01/12/2014  . DTs (delirium tremens) ([place]) 09/26/2013  . 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 Ataxia 04/02/2013  . Leukocytosis 04/02/2013  . CAD (coronary artery disease) 04/02/2013  .  Hyperlipemia 04/02/2013  . HTN (hypertension) 04/02/2013  . Abnormal MRA, brain 11/06/2012  . Arthralgia 09/02/2012  . Synovitis of wrist 07/25/2012  . OA (osteoarthritis) 07/25/2012  . Spastic colon 05/03/2011  . Arthritis 05/03/2011  . Coronary atherosclerosis 11/04/2008  . DEPRESSION 11/03/2008  . DYSTHYMIC DISORDER 10/13/2008  . STRESS ELECTROCARDIOGRAM, ABNORMAL 10/13/2008     Past Surgical History:  Procedure Laterality Date  . ATRIAL FIBRILLATION ABLATION N/A 02/11/2019   Procedure: ATRIAL FIBRILLATION ABLATION;  Surgeon: Constance Haw, MD;  Location: [place];  Service: Cardiovascular;  Laterality: N/A;  . CARDIAC CATHETERIZATION    . CATARACT EXTRACTION, BILATERAL    . COLONOSCOPY WITH PROPOFOL N/A 04/17/2017   Procedure: COLONOSCOPY WITH PROPOFOL;  Surgeon: Yetta Flock, MD;  Location: [place];  Service: Gastroenterology;  Laterality: N/A;  . ESOPHAGEAL DILATION  2017  . ESOPHAGOGASTRODUODENOSCOPY (EGD) WITH PROPOFOL N/A 04/17/2017   Procedure: ESOPHAGOGASTRODUODENOSCOPY (EGD) WITH PROPOFOL;  Surgeon: Yetta Flock, MD;  Location: [facility];  Service: Gastroenterology;  Laterality: N/A;  . EYE SURGERY    . FOOT NEUROMA SURGERY  2002  . LEFT HEART CATH AND CORONARY ANGIOGRAPHY N/A 01/16/2019   Procedure: LEFT HEART CATH AND CORONARY ANGIOGRAPHY;  Surgeon: Jettie Booze, MD;  Location: [place] [facility];  Service: Cardiovascular;  Laterality: N/A;  . LEFT HEART CATHETERIZATION WITH CORONARY ANGIOGRAM Right 06/14/2011   20% LM, chronic occluded mid LAD, 50% ostial LCX, 20% mid RI, RCA with collaterals to mid LAD, mid 10% stenosis, EF 60% 06/14/11  . NASAL SINUS SURGERY    . SHOULDER ARTHROSCOPY  03/01/2011   Procedure: ARTHROSCOPY SHOULDER;  Surgeon: Ninetta Lights, MD;  Location: [facility];  Service: Orthopedics;  Laterality: Right;  arthroscopy shoulder decompression subacromial partial acromioplasty with coracoacromial release, distal claviculectomy, debridement of labrium  . SHOULDER ARTHROSCOPY  03/01/2011   Procedure: ARTHROSCOPY SHOULDER;  Surgeon: Ninetta Lights, MD;  Location: [place];  Service: Orthopedics;  Laterality: Right;  arthroscopy shoulder decompression subacromial partial acromioplasty with coracoacromial release, distal claviculectomy, debridement of labrium  . TEE  WITHOUT CARDIOVERSION N/A 03/19/2016   Procedure: TRANSESOPHAGEAL ECHOCARDIOGRAM (TEE);  Surgeon: Larey Dresser, MD;  Location: [place];  Service: Cardiovascular;  Laterality: N/A;  . TOTAL KNEE ARTHROPLASTY Right 09/23/2013   Procedure: TOTAL KNEE ARTHROPLASTY;  Surgeon: Ninetta Lights, MD;  Location: [place];  Service: Orthopedics;  Laterality: Right;  . TOTAL SHOULDER ARTHROPLASTY  06/28/2011   Procedure: TOTAL SHOULDER ARTHROPLASTY;  Surgeon: Ninetta Lights, MD;  Location: [facility];  Service: Orthopedics;  Laterality: Right;  . UVULOPALATOPHARYNGOPLASTY    . VIDEO ASSISTED THORACOSCOPY (VATS)/WEDGE RESECTION Right 06/02/2018   Procedure: VIDEO ASSISTED THORACOSCOPY (VATS)/WEDGE RESECTION with Lymph node disection and intercostal nerve block.;  Surgeon: [name], MD;  Location: [facility];  Service: Thoracic;  Laterality: Right;    Allergies Glucosamine-chondroitin, Tetanus toxoid, Fish oil, Ibuprofen, Naproxen, Glucosamine-chondroitin, Ibuprofen, and Naproxen  Family History  Problem Relation Age of Onset  . Cancer Mother        colon  . Heart attack Father   . Migraines Brother     Social History Social History   Tobacco Use  . Smoking status: Former Smoker    Packs/day: 2.00    Years: 30.00    Pack years: 60.00    Types: Cigarettes    Quit date: 02/27/1998    Years since quitting: 21.5  . Smokeless tobacco: Never Used  Vaping Use  . Vaping Use: Never used  Substance Use Topics  . Alcohol use: Yes    Alcohol/week: 14.0 standard drinks    Types: 14 Standard drinks or equivalent per week    Comment: 2 drinks daily ([place])  . Drug use: No    Review of Systems  Constitutional: No fever/chills Eyes: No visual changes. ENT: No sore throat. Cardiovascular: Denies chest pain. Respiratory: Denies shortness of breath. Gastrointestinal: No abdominal pain.  No nausea, no vomiting.  No diarrhea.  No constipation. Genitourinary: Negative for  dysuria. Musculoskeletal: Positive left shoulder pain.  Skin: Negative for rash. Abrasions to the left knee and right ankle.  Neurological: Negative for headaches, focal weakness or numbness.  10-point ROS otherwise negative.  ____________________________________________   PHYSICAL EXAM:  VITAL SIGNS: ED Triage Vitals  Enc Vitals Group     BP 09/02/19 2155 109/86     Pulse Rate 09/02/19 2155 69     Resp 09/02/19 2155 18     Temp 09/02/19 2155 97.7 F (36.5 C)     Temp Source 09/02/19 2155 Oral     SpO2 09/02/19 2155 96 %     Weight 09/02/19 2155 217 lb (98.4 kg)     Height 09/02/19 2155 5\' 9"  (1.753 m)   Constitutional: Alert and oriented. Well appearing and in no acute distress. Eyes: Conjunctivae are normal.  Head: Faint abrasion to the occipital scalp. No hematoma.  Nose: No congestion/rhinnorhea. Mouth/Throat: Mucous membranes are moist.  Oropharynx non-erythematous. Neck: No stridor. No cervical spine tenderness to palpation. Cardiovascular: Normal rate, regular rhythm. Good peripheral circulation. Grossly normal heart sounds.   Respiratory: Normal respiratory effort.  No retractions. Lungs CTAB. Gastrointestinal: Soft and nontender. No distention.  Musculoskeletal: Limited ROM of the left shoulder with focal anterior tenderness. Normal ROM of the remaining extremities without focal or point tenderness.  Neurologic:  Normal speech and language. No gross focal neurologic deficits are appreciated.  Skin:  Skin is warm and dry. Abrasion to the left anterior knee and right lateral ankle. No lacerations.    ____________________________________________  RADIOLOGY  CT Head Wo Contrast  Result Date: 09/02/2019 CLINICAL DATA:  Status post trauma. EXAM: CT HEAD WITHOUT CONTRAST TECHNIQUE: Contiguous axial images were obtained from the base of the skull through the vertex without intravenous contrast. COMPARISON:  Aug 03, 2019 FINDINGS: Brain: There is mild cerebral atrophy  with widening of the extra-axial spaces and ventricular dilatation. There are areas of decreased attenuation within the white matter tracts of the supratentorial brain, consistent with microvascular disease changes. A chronic right basal ganglia lacunar infarct is seen. Vascular: No hyperdense vessel or unexpected calcification. Skull: Normal. Negative for fracture or focal lesion. Sinuses/Orbits: There is mild bilateral ethmoid sinus mucosal thickening. Other: None. IMPRESSION: 1. Generalized cerebral atrophy. 2. No acute intracranial abnormality. Electronically Signed   By: Virgina Norfolk M.D.   On: 09/02/2019 22:24   DG Shoulder Left  Result Date: 09/02/2019 CLINICAL DATA:  Fall getting out of wheelchair, left shoulder pain. EXAM: LEFT SHOULDER - 2+ VIEW COMPARISON:  None. FINDINGS: Left shoulder arthroplasty in expected alignment. No periprosthetic lucency or fracture. The remainder of the shoulder is intact without acute fracture. No abnormality of the included ribs. IMPRESSION: Left shoulder arthroplasty in expected alignment without complication. No acute fracture. Electronically Signed   By: Keith Rake M.D.   On: 09/02/2019 21:42    ____________________________________________   PROCEDURES  Procedure(s) performed:   Procedures  None ____________________________________________   INITIAL  IMPRESSION / ASSESSMENT AND PLAN / ED COURSE  Pertinent labs & imaging results that were available during my care of the patient were reviewed by me and considered in my medical decision making (see chart for details).   Patient with mechanical fall on anticoagulation. CT head reviewed with no acute findings. Plain films of the left shoulder without fracture or dislocation. Sling provided but stressed ROM exercises every 1 hour while awake. Will f/u with ortho is pain persists. C-spine cleared by [facility]. Short course of pain meds given after [place] drug database review.     ____________________________________________  FINAL CLINICAL IMPRESSION(S) / ED DIAGNOSES  Final diagnoses:  Closed head injury, initial encounter  Fall, initial encounter  Acute pain of left shoulder  Abrasion of left knee, initial encounter  Abrasion of right ankle, initial encounter     MEDICATIONS GIVEN DURING THIS VISIT:  Medications  oxyCODONE-acetaminophen (PERCOCET/ROXICET) 5-325 MG per tablet 1 tablet (1 tablet Oral Given 09/02/19 2245)     NEW OUTPATIENT MEDICATIONS STARTED DURING THIS VISIT:  Discharge Medication List as of 09/02/2019 10:34 PM    START taking these medications   Details  oxyCODONE-acetaminophen (PERCOCET/ROXICET) 5-325 MG tablet Take 1 tablet by mouth every 6 (six) hours as needed for severe pain., Starting Wed 09/02/2019, Normal    senna-docusate (SENOKOT-S) 8.6-50 MG tablet Take 1 tablet by mouth at bedtime as needed for mild constipation or moderate constipation., Starting Wed 09/02/2019, Normal        Note:  This document was prepared using Dragon voice recognition software and may include unintentional dictation errors.  Nanda Quinton, MD, [facility]    Nolawi Kanady, [facility], MD 09/03/19 2027

## 2019-09-02 NOTE — Patient Instructions (Signed)
[facility] Discharge Instructions for Patients Receiving Chemotherapy  Today you received the following chemotherapy agents Imfinzi, Carboplatin and Etoposide  To help prevent nausea and vomiting after your treatment, we encourage you to take your nausea medication as directed.    If you develop nausea and vomiting that is not controlled by your nausea medication, call the clinic.   BELOW ARE SYMPTOMS THAT SHOULD BE REPORTED IMMEDIATELY:  *FEVER GREATER THAN 100.5 F  *CHILLS WITH OR WITHOUT FEVER  NAUSEA AND VOMITING THAT IS NOT CONTROLLED WITH YOUR NAUSEA MEDICATION  *UNUSUAL SHORTNESS OF BREATH  *UNUSUAL BRUISING OR BLEEDING  TENDERNESS IN MOUTH AND THROAT WITH OR WITHOUT PRESENCE OF ULCERS  *URINARY PROBLEMS  *BOWEL PROBLEMS  UNUSUAL RASH Items with * indicate a potential emergency and should be followed up as soon as possible.  Feel free to call the clinic should you have any questions or concerns. The clinic phone number is [phone].  Please show the [facility] at check-in to the Emergency Department and triage nurse.  Durvalumab injection What is this medicine? DURVALUMAB (dur VAL ue mab) is a monoclonal antibody. It is used to treat urothelial cancer and lung cancer. This medicine may be used for other purposes; ask your health care provider or pharmacist if you have questions. COMMON BRAND NAME(S): IMFINZI What should I tell my health care provider before I take this medicine? They need to know if you have any of these conditions:  diabetes  immune system problems  infection  inflammatory bowel disease  kidney disease  liver disease  lung or breathing disease  lupus  organ transplant  stomach or intestine problems  thyroid disease  an unusual or allergic reaction to durvalumab, other medicines, foods, dyes, or preservatives  pregnant or trying to get pregnant  breast-feeding How should I use this medicine? This  medicine is for infusion into a vein. It is given by a health care professional in a hospital or clinic setting. A special MedGuide will be given to you before each treatment. Be sure to read this information carefully each time. Talk to your pediatrician regarding the use of this medicine in children. Special care may be needed. Overdosage: If you think you have taken too much of this medicine contact a poison control center or emergency room at once. NOTE: This medicine is only for you. Do not share this medicine with others. What if I miss a dose? It is important not to miss your dose. Call your doctor or health care professional if you are unable to keep an appointment. What may interact with this medicine? Interactions have not been studied. This list may not describe all possible interactions. Give your health care provider a list of all the medicines, herbs, non-prescription drugs, or dietary supplements you use. Also tell them if you smoke, drink alcohol, or use illegal drugs. Some items may interact with your medicine. What should I watch for while using this medicine? This drug may make you feel generally unwell. Continue your course of treatment even though you feel ill unless your doctor tells you to stop. You may need blood work done while you are taking this medicine. Do not become pregnant while taking this medicine or for 3 months after stopping it. Women should inform their doctor if they wish to become pregnant or think they might be pregnant. There is a potential for serious side effects to an unborn child. Talk to your health care professional or pharmacist for more  information. Do not breast-feed an infant while taking this medicine or for 3 months after stopping it. What side effects may I notice from receiving this medicine? Side effects that you should report to your doctor or health care professional as soon as possible:  allergic reactions like skin rash, itching or hives,  swelling of the face, lips, or tongue  black, tarry stools  bloody or watery diarrhea  breathing problems  change in emotions or moods  change in sex drive  changes in vision  chest pain or chest tightness  chills  confusion  cough  facial flushing  fever  headache  signs and symptoms of high blood sugar such as dizziness; dry mouth; dry skin; fruity breath; nausea; stomach pain; increased hunger or thirst; increased urination  signs and symptoms of liver injury like dark yellow or brown urine; general ill feeling or flu-like symptoms; light-colored stools; loss of appetite; nausea; right upper belly pain; unusually weak or tired; yellowing of the eyes or skin  stomach pain  trouble passing urine or change in the amount of urine  weight gain or weight loss Side effects that usually do not require medical attention (report these to your doctor or health care professional if they continue or are bothersome):  bone pain  constipation  loss of appetite  muscle pain  nausea  swelling of the ankles, feet, hands  tiredness This list may not describe all possible side effects. Call your doctor for medical advice about side effects. You may report side effects to FDA at 1-800-FDA-1088. Where should I keep my medicine? This drug is given in a hospital or clinic and will not be stored at home. NOTE: This sheet is a summary. It may not cover all possible information. If you have questions about this medicine, talk to your doctor, pharmacist, or health care provider.  2020 Elsevier/Gold Standard (2016-05-08 19:25:04)  Carboplatin injection What is this medicine? CARBOPLATIN (KAR boe pla tin) is a chemotherapy drug. It targets fast dividing cells, like cancer cells, and causes these cells to die. This medicine is used to treat ovarian cancer and many other cancers. This medicine may be used for other purposes; ask your health care provider or pharmacist if you have  questions. COMMON BRAND NAME(S): Paraplatin What should I tell my health care provider before I take this medicine? They need to know if you have any of these conditions:  blood disorders  hearing problems  kidney disease  recent or ongoing radiation therapy  an unusual or allergic reaction to carboplatin, cisplatin, other chemotherapy, other medicines, foods, dyes, or preservatives  pregnant or trying to get pregnant  breast-feeding How should I use this medicine? This drug is usually given as an infusion into a vein. It is administered in a hospital or clinic by a specially trained health care professional. Talk to your pediatrician regarding the use of this medicine in children. Special care may be needed. Overdosage: If you think you have taken too much of this medicine contact a poison control center or emergency room at once. NOTE: This medicine is only for you. Do not share this medicine with others. What if I miss a dose? It is important not to miss a dose. Call your doctor or health care professional if you are unable to keep an appointment. What may interact with this medicine?  medicines for seizures  medicines to increase blood counts like filgrastim, pegfilgrastim, sargramostim  some antibiotics like amikacin, gentamicin, neomycin, streptomycin, tobramycin  vaccines Talk  to your doctor or health care professional before taking any of these medicines:  acetaminophen  aspirin  ibuprofen  ketoprofen  naproxen This list may not describe all possible interactions. Give your health care provider a list of all the medicines, herbs, non-prescription drugs, or dietary supplements you use. Also tell them if you smoke, drink alcohol, or use illegal drugs. Some items may interact with your medicine. What should I watch for while using this medicine? Your condition will be monitored carefully while you are receiving this medicine. You will need important blood work done  while you are taking this medicine. This drug may make you feel generally unwell. This is not uncommon, as chemotherapy can affect healthy cells as well as cancer cells. Report any side effects. Continue your course of treatment even though you feel ill unless your doctor tells you to stop. In some cases, you may be given additional medicines to help with side effects. Follow all directions for their use. Call your doctor or health care professional for advice if you get a fever, chills or sore throat, or other symptoms of a cold or flu. Do not treat yourself. This drug decreases your body's ability to fight infections. Try to avoid being around people who are sick. This medicine may increase your risk to bruise or bleed. Call your doctor or health care professional if you notice any unusual bleeding. Be careful brushing and flossing your teeth or using a toothpick because you may get an infection or bleed more easily. If you have any dental work done, tell your dentist you are receiving this medicine. Avoid taking products that contain aspirin, acetaminophen, ibuprofen, naproxen, or ketoprofen unless instructed by your doctor. These medicines may hide a fever. Do not become pregnant while taking this medicine. Women should inform their doctor if they wish to become pregnant or think they might be pregnant. There is a potential for serious side effects to an unborn child. Talk to your health care professional or pharmacist for more information. Do not breast-feed an infant while taking this medicine. What side effects may I notice from receiving this medicine? Side effects that you should report to your doctor or health care professional as soon as possible:  allergic reactions like skin rash, itching or hives, swelling of the face, lips, or tongue  signs of infection - fever or chills, cough, sore throat, pain or difficulty passing urine  signs of decreased platelets or bleeding - bruising, pinpoint  red spots on the skin, black, tarry stools, nosebleeds  signs of decreased red blood cells - unusually weak or tired, fainting spells, lightheadedness  breathing problems  changes in hearing  changes in vision  chest pain  high blood pressure  low blood counts - This drug may decrease the number of white blood cells, red blood cells and platelets. You may be at increased risk for infections and bleeding.  nausea and vomiting  pain, swelling, redness or irritation at the injection site  pain, tingling, numbness in the hands or feet  problems with balance, talking, walking  trouble passing urine or change in the amount of urine Side effects that usually do not require medical attention (report to your doctor or health care professional if they continue or are bothersome):  hair loss  loss of appetite  metallic taste in the mouth or changes in taste This list may not describe all possible side effects. Call your doctor for medical advice about side effects. You may report side effects to  FDA at 1-800-FDA-1088. Where should I keep my medicine? This drug is given in a hospital or clinic and will not be stored at home. NOTE: This sheet is a summary. It may not cover all possible information. If you have questions about this medicine, talk to your doctor, pharmacist, or health care provider.  2020 Elsevier/Gold Standard (2007-06-03 14:38:05)  Etoposide, VP-16 injection What is this medicine? ETOPOSIDE, VP-16 (e toe POE side) is a chemotherapy drug. It is used to treat testicular cancer, lung cancer, and other cancers. This medicine may be used for other purposes; ask your health care provider or pharmacist if you have questions. COMMON BRAND NAME(S): Etopophos, Toposar, VePesid What should I tell my health care provider before I take this medicine? They need to know if you have any of these conditions:  infection  kidney disease  liver disease  low blood counts, like low  white cell, platelet, or red cell counts  an unusual or allergic reaction to etoposide, other medicines, foods, dyes, or preservatives  pregnant or trying to get pregnant  breast-feeding How should I use this medicine? This medicine is for infusion into a vein. It is administered in a hospital or clinic by a specially trained health care professional. Talk to your pediatrician regarding the use of this medicine in children. Special care may be needed. Overdosage: If you think you have taken too much of this medicine contact a poison control center or emergency room at once. NOTE: This medicine is only for you. Do not share this medicine with others. What if I miss a dose? It is important not to miss your dose. Call your doctor or health care professional if you are unable to keep an appointment. What may interact with this medicine? This medicine may interact with the following medications:  warfarin This list may not describe all possible interactions. Give your health care provider a list of all the medicines, herbs, non-prescription drugs, or dietary supplements you use. Also tell them if you smoke, drink alcohol, or use illegal drugs. Some items may interact with your medicine. What should I watch for while using this medicine? Visit your doctor for checks on your progress. This drug may make you feel generally unwell. This is not uncommon, as chemotherapy can affect healthy cells as well as cancer cells. Report any side effects. Continue your course of treatment even though you feel ill unless your doctor tells you to stop. In some cases, you may be given additional medicines to help with side effects. Follow all directions for their use. Call your doctor or health care professional for advice if you get a fever, chills or sore throat, or other symptoms of a cold or flu. Do not treat yourself. This drug decreases your body's ability to fight infections. Try to avoid being around people who  are sick. This medicine may increase your risk to bruise or bleed. Call your doctor or health care professional if you notice any unusual bleeding. Talk to your doctor about your risk of cancer. You may be more at risk for certain types of cancers if you take this medicine. Do not become pregnant while taking this medicine or for at least 6 months after stopping it. Women should inform their doctor if they wish to become pregnant or think they might be pregnant. Women of child-bearing potential will need to have a negative pregnancy test before starting this medicine. There is a potential for serious side effects to an unborn child. Talk to your  health care professional or pharmacist for more information. Do not breast-feed an infant while taking this medicine. Men must use a latex condom during sexual contact with a woman while taking this medicine and for at least 4 months after stopping it. A latex condom is needed even if you have had a vasectomy. Contact your doctor right away if your partner becomes pregnant. Do not donate sperm while taking this medicine and for at least 4 months after you stop taking this medicine. Men should inform their doctors if they wish to father a child. This medicine may lower sperm counts. What side effects may I notice from receiving this medicine? Side effects that you should report to your doctor or health care professional as soon as possible:  allergic reactions like skin rash, itching or hives, swelling of the face, lips, or tongue  low blood counts - this medicine may decrease the number of white blood cells, red blood cells, and platelets. You may be at increased risk for infections and bleeding  nausea, vomiting  redness, blistering, peeling or loosening of the skin, including inside the mouth  signs and symptoms of infection like fever; chills; cough; sore throat; pain or trouble passing urine  signs and symptoms of low red blood cells or anemia such as  unusually weak or tired; feeling faint or lightheaded; falls; breathing problems  unusual bruising or bleeding Side effects that usually do not require medical attention (report to your doctor or health care professional if they continue or are bothersome):  changes in taste  diarrhea  hair loss  loss of appetite  mouth sores This list may not describe all possible side effects. Call your doctor for medical advice about side effects. You may report side effects to FDA at 1-800-FDA-1088. Where should I keep my medicine? This drug is given in a hospital or clinic and will not be stored at home. NOTE: This sheet is a summary. It may not cover all possible information. If you have questions about this medicine, talk to your doctor, pharmacist, or health care provider.  2020 Elsevier/Gold Standard (2018-04-23 16:57:15)

## 2019-09-02 NOTE — Discharge Instructions (Signed)
You were seen in the emergency room today after fall with head injury on a blood thinner.  Your CT scan looked normal.  The x-ray of your shoulder did not show fracture or dislocation.  You will continue to have soreness and pain over the next several days.  I have called in a prescription for Percocet.  Please remove your shoulder from the sling at least once every hour and perform range of motion exercises to prevent a frozen joint.  The Percocet can cause you to be drowsy and you cannot drive a car while taking this medicine.  Do not take this with alcohol or other pain medicines.  I have also called in some constipation medication should you develop constipation which is common with Percocet.  Please follow with your primary care doctor as well as your orthopedist if symptoms continue and/or worsen.

## 2019-09-02 NOTE — ED Triage Notes (Signed)
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

## 2019-09-03 ENCOUNTER — Other Ambulatory Visit: Payer: Self-pay | Admitting: Medical

## 2019-09-03 ENCOUNTER — Inpatient Hospital Stay (HOSPITAL_BASED_OUTPATIENT_CLINIC_OR_DEPARTMENT_OTHER): Payer: Medicare Other | Admitting: Medical

## 2019-09-03 ENCOUNTER — Inpatient Hospital Stay: Payer: Medicare Other

## 2019-09-03 ENCOUNTER — Other Ambulatory Visit: Payer: Self-pay

## 2019-09-03 VITALS — BP 88/56 | HR 71 | Temp 97.8°F | Resp 18

## 2019-09-03 DIAGNOSIS — C3491 Malignant neoplasm of unspecified part of right bronchus or lung: Secondary | ICD-10-CM

## 2019-09-03 DIAGNOSIS — M62838 Other muscle spasm: Secondary | ICD-10-CM

## 2019-09-03 DIAGNOSIS — C7A1 Malignant poorly differentiated neuroendocrine tumors: Secondary | ICD-10-CM

## 2019-09-03 DIAGNOSIS — Z5111 Encounter for antineoplastic chemotherapy: Secondary | ICD-10-CM | POA: Diagnosis not present

## 2019-09-03 MED ORDER — SODIUM CHLORIDE 0.9 % IV SOLN
Freq: Once | INTRAVENOUS | Status: AC
  Filled 2019-09-03: qty 250

## 2019-09-03 MED ORDER — SODIUM CHLORIDE 0.9 % IV SOLN
10.0000 mg | Freq: Once | INTRAVENOUS | Status: AC
  Administered 2019-09-03: 10 mg via INTRAVENOUS
  Filled 2019-09-03: qty 10

## 2019-09-03 MED ORDER — BACLOFEN 10 MG PO TABS: 10.0000 mg | ORAL_TABLET | Freq: Two times a day (BID) | ORAL | 0 refills | Status: AC

## 2019-09-03 MED ORDER — SODIUM CHLORIDE 0.9 % IV SOLN
100.0000 mg/m2 | Freq: Once | INTRAVENOUS | Status: AC
  Administered 2019-09-03: 220 mg via INTRAVENOUS
  Filled 2019-09-03: qty 11

## 2019-09-03 NOTE — Progress Notes (Signed)
This RN upon leaving the cancer center witnessed George Vega trip as he was getting into his car. He fell on his left shoulder and hit his head on the tire of his car. His wife was present. He has a scrap on his left leg and a small knot on the back of his head. Did range of motion and no deformities were present/ His vital signs: BP 104/66, Resp 16, o2 96%, HR 66. He was alert and oriented with no cognitive defects.  Patient stated that he felt fine. Encouraged patent to go to the Emergency room for evaluation since he hit his head. Both him and his wife voiced understanding.

## 2019-09-03 NOTE — Patient Instructions (Signed)
[facility] Discharge Instructions for Patients Receiving Chemotherapy  Today you received the following chemotherapy agents Etoposide+  To help prevent nausea and vomiting after your treatment, we encourage you to take your nausea medication as directed.    If you develop nausea and vomiting that is not controlled by your nausea medication, call the clinic.   BELOW ARE SYMPTOMS THAT SHOULD BE REPORTED IMMEDIATELY:  *FEVER GREATER THAN 100.5 F  *CHILLS WITH OR WITHOUT FEVER  NAUSEA AND VOMITING THAT IS NOT CONTROLLED WITH YOUR NAUSEA MEDICATION  *UNUSUAL SHORTNESS OF BREATH  *UNUSUAL BRUISING OR BLEEDING  TENDERNESS IN MOUTH AND THROAT WITH OR WITHOUT PRESENCE OF ULCERS  *URINARY PROBLEMS  *BOWEL PROBLEMS  UNUSUAL RASH Items with * indicate a potential emergency and should be followed up as soon as possible.  Feel free to call the clinic should you have any questions or concerns. The clinic phone number is [phone].  Please show the [place] at check-in to the Emergency Department and triage nurse.

## 2019-09-04 ENCOUNTER — Encounter (HOSPITAL_COMMUNITY): Payer: Self-pay | Admitting: Internal Medicine

## 2019-09-04 ENCOUNTER — Other Ambulatory Visit: Payer: Self-pay

## 2019-09-04 ENCOUNTER — Inpatient Hospital Stay: Payer: Medicare Other

## 2019-09-04 VITALS — BP 104/70 | HR 74 | Temp 97.5°F | Resp 16

## 2019-09-04 DIAGNOSIS — Z5111 Encounter for antineoplastic chemotherapy: Secondary | ICD-10-CM | POA: Diagnosis not present

## 2019-09-04 DIAGNOSIS — C7A1 Malignant poorly differentiated neuroendocrine tumors: Secondary | ICD-10-CM

## 2019-09-04 MED ORDER — SODIUM CHLORIDE 0.9 % IV SOLN
Freq: Once | INTRAVENOUS | Status: AC
  Filled 2019-09-04: qty 250

## 2019-09-04 MED ORDER — SODIUM CHLORIDE 0.9 % IV SOLN
10.0000 mg | Freq: Once | INTRAVENOUS | Status: AC
  Administered 2019-09-04: 10 mg via INTRAVENOUS
  Filled 2019-09-04: qty 10

## 2019-09-04 MED ORDER — SODIUM CHLORIDE 0.9 % IV SOLN
100.0000 mg/m2 | Freq: Once | INTRAVENOUS | Status: AC
  Administered 2019-09-04: 220 mg via INTRAVENOUS
  Filled 2019-09-04: qty 11

## 2019-09-04 NOTE — Patient Instructions (Signed)
[facility] Discharge Instructions for Patients Receiving Chemotherapy  Today you received the following chemotherapy agents: Etoposide  To help prevent nausea and vomiting after your treatment, we encourage you to take your nausea medication as prescribed.    If you develop nausea and vomiting that is not controlled by your nausea medication, call the clinic.   BELOW ARE SYMPTOMS THAT SHOULD BE REPORTED IMMEDIATELY:  *FEVER GREATER THAN 100.5 F  *CHILLS WITH OR WITHOUT FEVER  NAUSEA AND VOMITING THAT IS NOT CONTROLLED WITH YOUR NAUSEA MEDICATION  *UNUSUAL SHORTNESS OF BREATH  *UNUSUAL BRUISING OR BLEEDING  TENDERNESS IN MOUTH AND THROAT WITH OR WITHOUT PRESENCE OF ULCERS  *URINARY PROBLEMS  *BOWEL PROBLEMS  UNUSUAL RASH Items with * indicate a potential emergency and should be followed up as soon as possible.  Feel free to call the clinic should you have any questions or concerns. The clinic phone number is [phone].  Please show the [facility] at check-in to the Emergency Department and triage nurse.

## 2019-09-04 NOTE — Progress Notes (Signed)
George Vega was seen in the infusion room today as he was receiving chemotherapy.  He reports that he was getting out of the wheelchair last evening to get into his wife's car when he caught his foot on the foot rest of the wheelchair.  He fell onto his left shoulder.  He has had a history of a total left shoulder replacement.  He was taken to the emergency room work for his report he was evaluated and found not to have any fractures.  He reports today that he is having pain in his left shoulder.  On examination it is noted that he has an area of muscle spasms and tenderness in his left superior SCM.  It was noted today that his blood pressure is 88/56.  He reports that his blood pressure is generally a little bit higher but is consistently lower.  He is on amiodarone 200 mg once daily, Lasix 40 mg once daily on Monday Wednesday Fridays, irbesartan 150 mg once daily and metoprolol 25 mg p.o. twice daily.  He was given a prescription for baclofen 10 mg twice daily and was told to decrease his metoprolol to 25 mg once daily.  He was also asked to monitor his blood pressure at home.  This was reviewed with Dr. Earlie Server.  The patient expresses understanding and agreement with this plan.  Sandi Mealy, MHS, PA-C Physician Assistant

## 2019-09-07 ENCOUNTER — Inpatient Hospital Stay (HOSPITAL_BASED_OUTPATIENT_CLINIC_OR_DEPARTMENT_OTHER): Payer: Medicare Other | Admitting: Internal Medicine

## 2019-09-07 ENCOUNTER — Inpatient Hospital Stay: Payer: Medicare Other

## 2019-09-07 ENCOUNTER — Encounter: Payer: Self-pay | Admitting: Internal Medicine

## 2019-09-07 ENCOUNTER — Telehealth: Payer: Self-pay | Admitting: *Deleted

## 2019-09-07 ENCOUNTER — Other Ambulatory Visit: Payer: Self-pay

## 2019-09-07 VITALS — BP 75/44 | HR 74 | Temp 97.6°F | Resp 18 | Ht 69.0 in | Wt 215.1 lb

## 2019-09-07 VITALS — BP 94/55 | HR 66

## 2019-09-07 DIAGNOSIS — C7A1 Malignant poorly differentiated neuroendocrine tumors: Secondary | ICD-10-CM | POA: Diagnosis not present

## 2019-09-07 DIAGNOSIS — Z5112 Encounter for antineoplastic immunotherapy: Secondary | ICD-10-CM

## 2019-09-07 DIAGNOSIS — Z5111 Encounter for antineoplastic chemotherapy: Secondary | ICD-10-CM | POA: Diagnosis not present

## 2019-09-07 DIAGNOSIS — I959 Hypotension, unspecified: Secondary | ICD-10-CM

## 2019-09-07 DIAGNOSIS — C3411 Malignant neoplasm of upper lobe, right bronchus or lung: Secondary | ICD-10-CM | POA: Diagnosis not present

## 2019-09-07 DIAGNOSIS — C3491 Malignant neoplasm of unspecified part of right bronchus or lung: Secondary | ICD-10-CM | POA: Diagnosis not present

## 2019-09-07 LAB — CBC WITH DIFFERENTIAL (CANCER CENTER ONLY)
Abs Immature Granulocytes: 0.04 10*3/uL (ref 0.00–0.07)
Basophils Absolute: 0 10*3/uL (ref 0.0–0.1)
Basophils Relative: 0 %
Eosinophils Absolute: 0.3 10*3/uL (ref 0.0–0.5)
Eosinophils Relative: 4 %
HCT: 36.9 % — ABNORMAL LOW (ref 39.0–52.0)
Hemoglobin: 12.1 g/dL — ABNORMAL LOW (ref 13.0–17.0)
Immature Granulocytes: 1 %
Lymphocytes Relative: 9 %
Lymphs Abs: 0.6 10*3/uL — ABNORMAL LOW (ref 0.7–4.0)
MCH: 32.2 pg (ref 26.0–34.0)
MCHC: 32.8 g/dL (ref 30.0–36.0)
MCV: 98.1 fL (ref 80.0–100.0)
Monocytes Absolute: 0 10*3/uL — ABNORMAL LOW (ref 0.1–1.0)
Monocytes Relative: 0 %
Neutro Abs: 6.2 10*3/uL (ref 1.7–7.7)
Neutrophils Relative %: 86 %
Platelet Count: 158 10*3/uL (ref 150–400)
RBC: 3.76 MIL/uL — ABNORMAL LOW (ref 4.22–5.81)
RDW: 13.2 % (ref 11.5–15.5)
WBC Count: 7.2 10*3/uL (ref 4.0–10.5)
nRBC: 0 % (ref 0.0–0.2)

## 2019-09-07 LAB — CMP (CANCER CENTER ONLY)
ALT: 72 U/L — ABNORMAL HIGH (ref 0–44)
AST: 44 U/L — ABNORMAL HIGH (ref 15–41)
Albumin: 3 g/dL — ABNORMAL LOW (ref 3.5–5.0)
Alkaline Phosphatase: 131 U/L — ABNORMAL HIGH (ref 38–126)
Anion gap: 9 (ref 5–15)
BUN: 19 mg/dL (ref 8–23)
CO2: 25 mmol/L (ref 22–32)
Calcium: 8.6 mg/dL — ABNORMAL LOW (ref 8.9–10.3)
Chloride: 102 mmol/L (ref 98–111)
Creatinine: 0.89 mg/dL (ref 0.61–1.24)
GFR, Est AFR Am: >60 mL/min (ref >60)
GFR, Estimated: >60 mL/min (ref >60)
Glucose, Bld: 117 mg/dL — ABNORMAL HIGH (ref 70–99)
Potassium: 4.1 mmol/L (ref 3.5–5.1)
Sodium: 136 mmol/L (ref 135–145)
Total Bilirubin: 1.6 mg/dL — ABNORMAL HIGH (ref 0.3–1.2)
Total Protein: 5.7 g/dL — ABNORMAL LOW (ref 6.5–8.1)

## 2019-09-07 MED ORDER — PEGFILGRASTIM-JMDB 6 MG/0.6ML ~~LOC~~ SOSY
6.0000 mg | PREFILLED_SYRINGE | Freq: Once | SUBCUTANEOUS | Status: AC
  Administered 2019-09-07: 6 mg via SUBCUTANEOUS

## 2019-09-07 MED ORDER — PEGFILGRASTIM-JMDB 6 MG/0.6ML ~~LOC~~ SOSY
PREFILLED_SYRINGE | SUBCUTANEOUS | Status: AC
  Filled 2019-09-07: qty 0.6

## 2019-09-07 MED ORDER — SODIUM CHLORIDE 0.9 % IV SOLN
INTRAVENOUS | Status: DC
  Filled 2019-09-07 (×3): qty 250

## 2019-09-07 NOTE — Progress Notes (Signed)
[employer] [phone]   [phone]  OFFICE PROGRESS NOTE  [name], MD [place] [address]  DIAGNOSIS: Metastatic non-small cell lung cancer, poorly differentiated carcinoma with neuroendocrine features diagnosed in June 2021 initially diagnosed as stage IB (T2a, N0, M0) non-small cell lung cancer, squamous cell carcinoma presented with right lower lobe lung nodule diagnosed in February 2020.  PRIOR THERAPY: Right VATS with superior segmentectomy on the right lower lobe with lymph node dissection under the care of Dr. Servando Snare on June 02, 2018.  CURRENT THERAPY: Observation.  INTERVAL HISTORY: George Vega 79 y.o. male returns to the clinic today for follow-up visit accompanied by his daughter.  The patient tolerated the first week of his treatment well except for fatigue.  He has a fall last week when he tripped on the wheelchair.  He has bruises on the left shoulder.  He had x-ray of the shoulder at the emergency department at that time that showed no evidence of fracture.  He denied having any current chest pain but has shortness of breath with exertion with no cough or hemoptysis.  He denied having any fever or chills.  He has no nausea, vomiting, diarrhea or constipation.  He denied having any headache or visual changes.  The patient is here today for evaluation and repeat blood work.  MEDICAL HISTORY: Past Medical History:  Diagnosis Date  . Arthritis   . Atrial fibrillation, persistent ([facility])   . Cancer ([facility])    liver  . CHF (congestive heart failure) ([facility])   . Chronic sinusitis   . Coronary artery disease   . Depression   . Difficult intubation    was told with shoulder done 2006-alittle narrow  . 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 Hypertension   . Jejunostomy tube fell out    when asked about this in 04/2017, pt denied ever having a J tube, feeding tube, tubes placed post surgery so ??? veracity of a previous J  tube.    . 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 Spastic colon   . Squamous cell carcinoma of lung, stage I, right ([facility]) 05/07/2018   bx 04/29/18; isolated PET uptake in RLL mass  . Stroke (cerebrum) ([facility])   . SVT (supraventricular tachycardia) ([facility])     ALLERGIES:  is allergic to glucosamine-chondroitin, tetanus toxoid, fish oil, ibuprofen, naproxen, glucosamine-chondroitin, ibuprofen, and naproxen.  MEDICATIONS:  Current Outpatient Medications  Medication Sig Dispense Refill  . acetaminophen (TYLENOL) 325 MG tablet Take 650 mg by mouth 2 (two) times daily as needed for mild pain or headache.    Marland Kitchen amiodarone (PACERONE) 200 MG tablet Take 200 mg by mouth daily.    . baclofen (LIORESAL) 10 MG tablet Take 1 tablet (10 mg total) by mouth 2 (two) times daily. 30 each 0  . Cholecalciferol 125 MCG (5000 UT) TABS Take 1 tablet by mouth daily.    Marland Kitchen ELIQUIS 5 MG TABS tablet Take 5 mg by mouth 2 (two) times daily.    . ferrous sulfate (FEROSUL) 325 (65 FE) MG tablet Take 1 tablet (325 mg total) by mouth 2 (two) times daily with a meal.    . fluticasone (FLONASE) 50 MCG/ACT nasal spray Place 1-2 sprays into both nostrils daily as needed for allergies or rhinitis.    . furosemide (LASIX) 40 MG tablet Take 1 tablet by mouth every Monday, Wednesday, and Friday.    . irbesartan (  AVAPRO) 150 MG tablet Take 150 mg by mouth daily.    . magnesium oxide (MAG-OX) 400 MG tablet Take 1 tablet (400 mg total) by mouth 2 (two) times daily. 60 tablet 0  . meclizine (ANTIVERT) 25 MG tablet Take 1 tablet (25 mg total) by mouth 3 (three) times daily as needed for dizziness. 30 tablet 0  . Melatonin 10 MG TABS Take 1 tablet by mouth at bedtime as needed (Sleep).    . metoprolol tartrate (LOPRESSOR) 25 MG tablet Take 1 tablet (25 mg total) by mouth 2 (two) times daily. 180 tablet 3  . Multiple Vitamins-Minerals (PRESERVISION AREDS 2) CAPS Take 1 capsule by mouth 2 (two) times daily.    Marland Kitchen  oxyCODONE-acetaminophen (PERCOCET/ROXICET) 5-325 MG tablet Take 1 tablet by mouth every 6 (six) hours as needed for severe pain. 10 tablet 0  . pantoprazole (PROTONIX) 40 MG tablet Take 40 mg by mouth daily.    Marland Kitchen PARoxetine (PAXIL) 40 MG tablet Take 40 mg by mouth at bedtime.    . polycarbophil (FIBERCON) 625 MG tablet Take 1,250 mg by mouth at bedtime.     [name] Glycol-Propyl Glycol (SYSTANE OP) Place 2 drops into both eyes 2 (two) times daily as needed (dry eyes).     . Potassium Chloride ER 20 MEQ TBCR Take 1 tablet by mouth every Monday, Wednesday, and Friday. Take with furosemide.    . prochlorperazine (COMPAZINE) 10 MG tablet Take 1 tablet (10 mg total) by mouth every 6 (six) hours as needed. 30 tablet 2  . rosuvastatin (CRESTOR) 5 MG tablet TAKE 1 TABLET BY MOUTH IN  THE MORNING (Patient taking differently: Take 5 mg by mouth daily. ) 90 tablet 3  . senna-docusate (SENOKOT-S) 8.6-50 MG tablet Take 1 tablet by mouth at bedtime as needed for mild constipation or moderate constipation. 30 tablet 0  . tamsulosin (FLOMAX) 0.4 MG CAPS capsule Take 0.4 mg by mouth at bedtime.   11   No current facility-administered medications for this visit.    SURGICAL HISTORY:  Past Surgical History:  Procedure Laterality Date  . ATRIAL FIBRILLATION ABLATION N/A 02/11/2019   Procedure: ATRIAL FIBRILLATION ABLATION;  Surgeon: Constance Haw, MD;  Location: [facility];  Service: Cardiovascular;  Laterality: N/A;  . CARDIAC CATHETERIZATION    . CATARACT EXTRACTION, BILATERAL    . COLONOSCOPY WITH PROPOFOL N/A 04/17/2017   Procedure: COLONOSCOPY WITH PROPOFOL;  Surgeon: Yetta Flock, MD;  Location: [facility];  Service: Gastroenterology;  Laterality: N/A;  . ESOPHAGEAL DILATION  2017  . ESOPHAGOGASTRODUODENOSCOPY (EGD) WITH PROPOFOL N/A 04/17/2017   Procedure: ESOPHAGOGASTRODUODENOSCOPY (EGD) WITH PROPOFOL;  Surgeon: Yetta Flock, MD;  Location: [facility];  Service:  Gastroenterology;  Laterality: N/A;  . EYE SURGERY    . FOOT NEUROMA SURGERY  2002  . LEFT HEART CATH AND CORONARY ANGIOGRAPHY N/A 01/16/2019   Procedure: LEFT HEART CATH AND CORONARY ANGIOGRAPHY;  Surgeon: Jettie Booze, MD;  Location: [facility];  Service: Cardiovascular;  Laterality: N/A;  . LEFT HEART CATHETERIZATION WITH CORONARY ANGIOGRAM Right 06/14/2011   20% LM, chronic occluded mid LAD, 50% ostial LCX, 20% mid RI, RCA with collaterals to mid LAD, mid 10% stenosis, EF 60% 06/14/11  . NASAL SINUS SURGERY    . SHOULDER ARTHROSCOPY  03/01/2011   Procedure: ARTHROSCOPY SHOULDER;  Surgeon: Ninetta Lights, MD;  Location: [facility];  Service: Orthopedics;  Laterality: Right;  arthroscopy shoulder decompression subacromial partial acromioplasty with  coracoacromial release, distal claviculectomy, debridement of labrium  . SHOULDER ARTHROSCOPY  03/01/2011   Procedure: ARTHROSCOPY SHOULDER;  Surgeon: Ninetta Lights, MD;  Location: [facility];  Service: Orthopedics;  Laterality: Right;  arthroscopy shoulder decompression subacromial partial acromioplasty with coracoacromial release, distal claviculectomy, debridement of labrium  . TEE WITHOUT CARDIOVERSION N/A 03/19/2016   Procedure: TRANSESOPHAGEAL ECHOCARDIOGRAM (TEE);  Surgeon: Larey Dresser, MD;  Location: [facility];  Service: Cardiovascular;  Laterality: N/A;  . TOTAL KNEE ARTHROPLASTY Right 09/23/2013   Procedure: TOTAL KNEE ARTHROPLASTY;  Surgeon: Ninetta Lights, MD;  Location: [facility];  Service: Orthopedics;  Laterality: Right;  . TOTAL SHOULDER ARTHROPLASTY  06/28/2011   Procedure: TOTAL SHOULDER ARTHROPLASTY;  Surgeon: Ninetta Lights, MD;  Location: [facility];  Service: Orthopedics;  Laterality: Right;  . UVULOPALATOPHARYNGOPLASTY    . VIDEO ASSISTED THORACOSCOPY (VATS)/WEDGE RESECTION Right 06/02/2018   Procedure: VIDEO ASSISTED THORACOSCOPY (VATS)/WEDGE RESECTION with Lymph node  disection and intercostal nerve block.;  Surgeon: [name], MD;  Location: [facility];  Service: Thoracic;  Laterality: Right;    REVIEW OF SYSTEMS:  Constitutional: positive for fatigue Eyes: negative Ears, nose, mouth, throat, and face: negative Respiratory: negative Cardiovascular: negative Gastrointestinal: negative Genitourinary:negative Integument/breast: negative Hematologic/lymphatic: negative Musculoskeletal:positive for arthralgias Neurological: negative Behavioral/Psych: negative Endocrine: negative Allergic/Immunologic: negative   PHYSICAL EXAMINATION: General appearance: alert, cooperative, fatigued and no distress Head: Normocephalic, without obvious abnormality, atraumatic Neck: no adenopathy, no JVD, supple, symmetrical, trachea midline and thyroid not enlarged, symmetric, no tenderness/mass/nodules Lymph nodes: Cervical, supraclavicular, and axillary nodes normal. Resp: clear to auscultation bilaterally Back: symmetric, no curvature. ROM normal. No CVA tenderness. Cardio: regular rate and rhythm, S1, S2 normal, no murmur, click, rub or gallop GI: soft, non-tender; bowel sounds normal; no masses,  no organomegaly Extremities: extremities normal, atraumatic, no cyanosis or edema Neurologic: Alert and oriented X 3, normal strength and tone. Normal symmetric reflexes. Normal coordination and gait  ECOG PERFORMANCE STATUS: 1 - Symptomatic but completely ambulatory  Blood pressure (!) 75/44, pulse 74, temperature 97.6 F (36.4 C), temperature source Temporal, resp. rate 18, height 5\' 9"  (1.753 m), weight 215 lb 1.6 oz (97.6 kg), SpO2 94 %.  LABORATORY DATA: Lab Results  Component Value Date   WBC 7.3 09/02/2019   HGB 14.2 09/02/2019   HCT 43.3 09/02/2019   MCV 98.2 09/02/2019   PLT 182 09/02/2019      Chemistry      Component Value Date/Time   NA 139 09/02/2019 1216   NA 139 02/24/2019 1246   K 4.2 09/02/2019 1216   CL 105 09/02/2019 1216   CO2 25  09/02/2019 1216   BUN 16 09/02/2019 1216   BUN 18 02/24/2019 1246   CREATININE 1.07 09/02/2019 1216      Component Value Date/Time   CALCIUM 9.2 09/02/2019 1216   ALKPHOS 168 (H) 09/02/2019 1216   AST 43 (H) 09/02/2019 1216   ALT 62 (H) 09/02/2019 1216   BILITOT 0.7 09/02/2019 1216       RADIOGRAPHIC STUDIES: CT Head Wo Contrast  Result Date: 09/02/2019 CLINICAL DATA:  Status post trauma. EXAM: CT HEAD WITHOUT CONTRAST TECHNIQUE: Contiguous axial images were obtained from the base of the skull through the vertex without intravenous contrast. COMPARISON:  Aug 03, 2019 FINDINGS: Brain: There is mild cerebral atrophy with widening of the extra-axial spaces and ventricular dilatation. There are areas of decreased attenuation within the white matter tracts of the supratentorial brain, consistent with microvascular  disease changes. A chronic right basal ganglia lacunar infarct is seen. Vascular: No hyperdense vessel or unexpected calcification. Skull: Normal. Negative for fracture or focal lesion. Sinuses/Orbits: There is mild bilateral ethmoid sinus mucosal thickening. Other: None. IMPRESSION: 1. Generalized cerebral atrophy. 2. No acute intracranial abnormality. Electronically Signed   By: Virgina Norfolk M.D.   On: 09/02/2019 22:24   DG Shoulder Left  Result Date: 09/02/2019 CLINICAL DATA:  Fall getting out of wheelchair, left shoulder pain. EXAM: LEFT SHOULDER - 2+ VIEW COMPARISON:  None. FINDINGS: Left shoulder arthroplasty in expected alignment. No periprosthetic lucency or fracture. The remainder of the shoulder is intact without acute fracture. No abnormality of the included ribs. IMPRESSION: Left shoulder arthroplasty in expected alignment without complication. No acute fracture. Electronically Signed   By: Keith Rake M.D.   On: 09/02/2019 21:42   [place] CORE BIOPSY (LIVER)  Result Date: 08/18/2019 INDICATION: LUNG CANCER, LIVER METASTASES EXAM: ULTRASOUND RIGHT LIVER METASTASIS 18  GAUGE CORE BIOPSY MEDICATIONS: 1% LIDOCAINE ANESTHESIA/SEDATION: Moderate (conscious) sedation was employed during this procedure. A total of Versed 1.0 mg and Fentanyl 50 mcg was administered intravenously. Moderate Sedation Time: 10 minutes. The patient's level of consciousness and vital signs were monitored continuously by radiology nursing throughout the procedure under my direct supervision. FLUOROSCOPY TIME:  Fluoroscopy Time: NONE. COMPLICATIONS: None immediate. PROCEDURE: Informed written consent was obtained from the patient after a thorough discussion of the procedural risks, benefits and alternatives. All questions were addressed. Maximal Sterile Barrier Technique was utilized including caps, mask, sterile gowns, sterile gloves, sterile drape, hand hygiene and skin antiseptic. A timeout was performed prior to the initiation of the procedure. previous imaging reviewed. patient positioned slightly right anterior oblique. Preliminary ultrasound performed. small hypoechoic lesions present in the right lobe. a peripheral lesion was marked a lower intercostal space in the mid axillary line. under sterile conditions and local anesthesia, a 17 gauge coaxial guide needles advanced to a hypoechoic lesion. needle position confirmed with ultrasound. images obtained for documentation. 3 18 gauge core biopsies obtained. samples were intact and fragmented. these were placed in formalin. needle tract occluded with gel-foam. postprocedure imaging demonstrates no hemorrhage or hematoma. patient tolerated biopsy well. IMPRESSION: Successful ultrasound right liver metastasis 18 gauge core biopsy Electronically Signed   By: Jerilynn Mages.  Shick M.D.   On: 08/18/2019 14:51    ASSESSMENT AND PLAN: This is a very pleasant 79 years old white male with metastatic non-small cell lung cancer, poorly differentiated carcinoma with neuroendocrine features diagnosed in June 2021 initially diagnosed as stage IB non-small cell lung cancer,  squamous cell carcinoma status post superior segmentectomy of the right lower lobe with lymph node dissection in March 2020 under the care of Dr. Servando Snare. The tumor size was 2.7 cm with visceropleural involvement. He was found on recent imaging studies to have evidence for disease metastasis in the liver as well as the lung but no evidence for metastatic disease to the brain. Ultrasound-guided core biopsy of one of the metastatic liver lesion was consistent with metastatic poorly differentiated carcinoma with neuroendocrine features. The patient is currently undergoing systemic chemotherapy with carboplatin, etoposide and Imfinzi status post 1 cycle started last week. The patient tolerated the first week of his treatment well except for fatigue. I recommended for him to continue his current treatment and he is expected to start cycle #2 in 2 weeks. For the hypotension, I will arrange for the patient to receive 1 L of normal saline in the clinic today.  He  was also encouraged to increase his oral intake. For the left shoulder pain after the fall, he will apply lidocaine patch to that area in addition to his current treatment with Tylenol or ibuprofen if needed. The patient was advised to call immediately if he has any other concerning symptoms in the interval. The patient voices understanding of current disease status and treatment options and is in agreement with the current care plan.  All questions were answered. The patient knows to call the clinic with any problems, questions or concerns. We can certainly see the patient much sooner if necessary.  Disclaimer: This note was dictated with voice recognition software. Similar sounding words can inadvertently be transcribed and may not be corrected upon review.

## 2019-09-07 NOTE — Telephone Encounter (Signed)
-----   Message from Veverly Fells, RN sent at 09/02/2019  4:06 PM EDT ----- Regarding: George Vega First time Follow up: First time Imfinzi, Carboplatin and Etoposide today via PIV. Day 2 and 3 of Etoposide on 6/24 and 6/25. Tolerated well

## 2019-09-07 NOTE — Telephone Encounter (Signed)
Called & spoke to wife about how pt did post treatment.  His shoulder is sore/bruised from fall but medication is helping.  She reports that he is fatigued & resting, no n/v, eating but less than usual & drinking fluids. He took ducolax for constipation but she doesn't know if he has had results yet & if not will repeat.  He sees Dr Julien Nordmann today & will get neulasta injection today.  Reminded to call with any problems.

## 2019-09-07 NOTE — Patient Instructions (Addendum)
Pegfilgrastim injection What is this medicine? PEGFILGRASTIM (PEG fil gra stim) is a long-acting granulocyte colony-stimulating factor that stimulates the growth of neutrophils, a type of white blood cell important in the body's fight against infection. It is used to reduce the incidence of fever and infection in patients with certain types of cancer who are receiving chemotherapy that affects the bone marrow, and to increase survival after being exposed to high doses of radiation. This medicine may be used for other purposes; ask your health care provider or pharmacist if you have questions. COMMON BRAND NAME(S): Steve Rattler, Ziextenzo What should I tell my health care provider before I take this medicine? They need to know if you have any of these conditions:  kidney disease  latex allergy  ongoing radiation therapy  sickle cell disease  skin reactions to acrylic adhesives (On-Body Injector only)  an unusual or allergic reaction to pegfilgrastim, filgrastim, other medicines, foods, dyes, or preservatives  pregnant or trying to get pregnant  breast-feeding How should I use this medicine? This medicine is for injection under the skin. If you get this medicine at home, you will be taught how to prepare and give the pre-filled syringe or how to use the On-body Injector. Refer to the patient Instructions for Use for detailed instructions. Use exactly as directed. Tell your healthcare provider immediately if you suspect that the On-body Injector may not have performed as intended or if you suspect the use of the On-body Injector resulted in a missed or partial dose. It is important that you put your used needles and syringes in a special sharps container. Do not put them in a trash can. If you do not have a sharps container, call your pharmacist or healthcare provider to get one. Talk to your pediatrician regarding the use of this medicine in children. While this drug may be  prescribed for selected conditions, precautions do apply. Overdosage: If you think you have taken too much of this medicine contact a poison control center or emergency room at once. NOTE: This medicine is only for you. Do not share this medicine with others. What if I miss a dose? It is important not to miss your dose. Call your doctor or health care professional if you miss your dose. If you miss a dose due to an On-body Injector failure or leakage, a new dose should be administered as soon as possible using a single prefilled syringe for manual use. What may interact with this medicine? Interactions have not been studied. Give your health care provider a list of all the medicines, herbs, non-prescription drugs, or dietary supplements you use. Also tell them if you smoke, drink alcohol, or use illegal drugs. Some items may interact with your medicine. This list may not describe all possible interactions. Give your health care provider a list of all the medicines, herbs, non-prescription drugs, or dietary supplements you use. Also tell them if you smoke, drink alcohol, or use illegal drugs. Some items may interact with your medicine. What should I watch for while using this medicine? You may need blood work done while you are taking this medicine. If you are going to need a MRI, CT scan, or other procedure, tell your doctor that you are using this medicine (On-Body Injector only). What side effects may I notice from receiving this medicine? Side effects that you should report to your doctor or health care professional as soon as possible:  allergic reactions like skin rash, itching or hives, swelling of the  face, lips, or tongue  back pain  dizziness  fever  pain, redness, or irritation at site where injected  pinpoint red spots on the skin  red or dark-brown urine  shortness of breath or breathing problems  stomach or side pain, or pain at the  shoulder  swelling  tiredness  trouble passing urine or change in the amount of urine Side effects that usually do not require medical attention (report to your doctor or health care professional if they continue or are bothersome):  bone pain  muscle pain This list may not describe all possible side effects. Call your doctor for medical advice about side effects. You may report side effects to FDA at 1-800-FDA-1088. Where should I keep my medicine? Keep out of the reach of children. If you are using this medicine at home, you will be instructed on how to store it. Throw away any unused medicine after the expiration date on the label. NOTE: This sheet is a summary. It may not cover all possible information. If you have questions about this medicine, talk to your doctor, pharmacist, or health care provider.  2020 Elsevier/Gold Standard (2017-06-03 16:57:08)    Rehydration, Adult Rehydration is the replacement of body fluids and salts and minerals (electrolytes) that are lost during dehydration. Dehydration is when there is not enough fluid or water in the body. This happens when you lose more fluids than you take in. Common causes of dehydration include:  Vomiting.  Diarrhea.  Excessive sweating, such as from heat exposure or exercise.  Taking medicines that cause the body to lose excess fluid (diuretics).  Impaired kidney function.  Not drinking enough fluid.  Certain illnesses or infections.  Certain poorly controlled long-term (chronic) illnesses, such as diabetes, heart disease, and kidney disease.  Symptoms of mild dehydration may include thirst, dry lips and mouth, dry skin, and dizziness. Symptoms of severe dehydration may include increased heart rate, confusion, fainting, and not urinating. You can rehydrate by drinking certain fluids or getting fluids through an IV tube, as told by your health care provider. What are the risks? Generally, rehydration is safe.  However, one problem that can happen is taking in too much fluid (overhydration). This is rare. If overhydration happens, it can cause an electrolyte imbalance, kidney failure, or a decrease in salt (sodium) levels in the body. How to rehydrate Follow instructions from your health care provider for rehydration. The kind of fluid you should drink and the amount you should drink depend on your condition.  If directed by your health care provider, drink an oral rehydration solution (ORS). This is a drink designed to treat dehydration that is found in pharmacies and retail stores. ? Make an ORS by following instructions on the package. ? Start by drinking small amounts, about  cup (120 mL) every 5-10 minutes. ? Slowly increase how much you drink until you have taken the amount recommended by your health care provider.  Drink enough clear fluids to keep your urine clear or pale yellow. If you were instructed to drink an ORS, finish the ORS first, then start slowly drinking other clear fluids. Drink fluids such as: ? Water. Do not drink only water. Doing that can lead to having too little sodium in your body (hyponatremia). ? Ice chips. ? Fruit juice that you have added water to (diluted juice). ? Low-calorie sports drinks.  If you are severely dehydrated, your health care provider may recommend that you receive fluids through an IV tube in the hospital.  Do not take sodium tablets. Doing that can lead to the condition of having too much sodium in your body (hypernatremia). Eating while you rehydrate Follow instructions from your health care provider about what to eat while you rehydrate. Your health care provider may recommend that you slowly begin eating regular foods in small amounts.  Eat foods that contain a healthy balance of electrolytes, such as bananas, oranges, potatoes, tomatoes, and spinach.  Avoid foods that are greasy or contain a lot of fat or sugar.  In some cases, you may get  nutrition through a feeding tube that is passed through your nose and into your stomach (nasogastric tube, or NG tube). This may be done if you have uncontrolled vomiting or diarrhea. Beverages to avoid Certain beverages may make dehydration worse. While you rehydrate, avoid:  Alcohol.  Caffeine.  Drinks that contain a lot of sugar. These include: ? High-calorie sports drinks. ? Fruit juice that is not diluted. ? Soda.  Check nutrition labels to see how much sugar or caffeine a beverage contains. Signs of dehydration recovery You may be recovering from dehydration if:  You are urinating more often than before you started rehydrating.  Your urine is clear or pale yellow.  Your energy level improves.  You vomit less frequently.  You have diarrhea less frequently.  Your appetite improves or returns to normal.  You feel less dizzy or less light-headed.  Your skin tone and color start to look more normal. Contact a health care provider if:  You continue to have symptoms of mild dehydration, such as: ? Thirst. ? Dry lips. ? Slightly dry mouth. ? Dry, warm skin. ? Dizziness.  You continue to vomit or have diarrhea. Get help right away if:  You have symptoms of dehydration that get worse.  You feel: ? Confused. ? Weak. ? Like you are going to faint.  You have not urinated in 6-8 hours.  You have very dark urine.  You have trouble breathing.  Your heart rate while sitting still is over 100 beats a minute.  You cannot drink fluids without vomiting.  You have vomiting or diarrhea that: ? Gets worse. ? Does not go away.  You have a fever. This information is not intended to replace advice given to you by your health care provider. Make sure you discuss any questions you have with your health care provider. Document Revised: 02/08/2017 Document Reviewed: 04/22/2015 Elsevier Patient Education  2020 [employer].

## 2019-09-09 ENCOUNTER — Encounter: Payer: Self-pay | Admitting: Internal Medicine

## 2019-09-10 ENCOUNTER — Other Ambulatory Visit: Payer: Self-pay | Admitting: Medical Oncology

## 2019-09-10 DIAGNOSIS — I878 Other specified disorders of veins: Secondary | ICD-10-CM

## 2019-09-11 ENCOUNTER — Ambulatory Visit: Payer: Medicare Other | Admitting: Orthopaedic Surgery

## 2019-09-15 ENCOUNTER — Other Ambulatory Visit: Payer: Self-pay

## 2019-09-15 ENCOUNTER — Inpatient Hospital Stay: Payer: Medicare Other | Attending: Internal Medicine

## 2019-09-15 DIAGNOSIS — C787 Secondary malignant neoplasm of liver and intrahepatic bile duct: Secondary | ICD-10-CM | POA: Insufficient documentation

## 2019-09-15 DIAGNOSIS — C7A1 Malignant poorly differentiated neuroendocrine tumors: Secondary | ICD-10-CM

## 2019-09-15 DIAGNOSIS — I110 Hypertensive heart disease with heart failure: Secondary | ICD-10-CM | POA: Diagnosis not present

## 2019-09-15 DIAGNOSIS — Z9221 Personal history of antineoplastic chemotherapy: Secondary | ICD-10-CM | POA: Insufficient documentation

## 2019-09-15 DIAGNOSIS — I2510 Atherosclerotic heart disease of native coronary artery without angina pectoris: Secondary | ICD-10-CM | POA: Insufficient documentation

## 2019-09-15 DIAGNOSIS — Z8673 Personal history of transient ischemic attack (TIA), and cerebral infarction without residual deficits: Secondary | ICD-10-CM | POA: Insufficient documentation

## 2019-09-15 DIAGNOSIS — Z79899 Other long term (current) drug therapy: Secondary | ICD-10-CM | POA: Insufficient documentation

## 2019-09-15 DIAGNOSIS — I4819 Other persistent atrial fibrillation: Secondary | ICD-10-CM | POA: Insufficient documentation

## 2019-09-15 DIAGNOSIS — R002 Palpitations: Secondary | ICD-10-CM | POA: Diagnosis not present

## 2019-09-15 DIAGNOSIS — I509 Heart failure, unspecified: Secondary | ICD-10-CM | POA: Insufficient documentation

## 2019-09-15 DIAGNOSIS — Z7952 Long term (current) use of systemic steroids: Secondary | ICD-10-CM | POA: Insufficient documentation

## 2019-09-15 DIAGNOSIS — C3431 Malignant neoplasm of lower lobe, right bronchus or lung: Secondary | ICD-10-CM | POA: Insufficient documentation

## 2019-09-15 DIAGNOSIS — Z7901 Long term (current) use of anticoagulants: Secondary | ICD-10-CM | POA: Insufficient documentation

## 2019-09-15 DIAGNOSIS — F329 Major depressive disorder, single episode, unspecified: Secondary | ICD-10-CM | POA: Insufficient documentation

## 2019-09-15 DIAGNOSIS — K589 Irritable bowel syndrome without diarrhea: Secondary | ICD-10-CM | POA: Insufficient documentation

## 2019-09-15 LAB — CBC WITH DIFFERENTIAL (CANCER CENTER ONLY)
Abs Immature Granulocytes: 2.95 10*3/uL — ABNORMAL HIGH (ref 0.00–0.07)
Basophils Absolute: 0 10*3/uL (ref 0.0–0.1)
Basophils Relative: 0 %
Eosinophils Absolute: 0.1 10*3/uL (ref 0.0–0.5)
Eosinophils Relative: 1 %
HCT: 35 % — ABNORMAL LOW (ref 39.0–52.0)
Hemoglobin: 11.4 g/dL — ABNORMAL LOW (ref 13.0–17.0)
Immature Granulocytes: 16 %
Lymphocytes Relative: 7 %
Lymphs Abs: 1.3 10*3/uL (ref 0.7–4.0)
MCH: 31.3 pg (ref 26.0–34.0)
MCHC: 32.6 g/dL (ref 30.0–36.0)
MCV: 96.2 fL (ref 80.0–100.0)
Monocytes Absolute: 2.3 10*3/uL — ABNORMAL HIGH (ref 0.1–1.0)
Monocytes Relative: 12 %
Neutro Abs: 12.2 10*3/uL — ABNORMAL HIGH (ref 1.7–7.7)
Neutrophils Relative %: 64 %
Platelet Count: 64 10*3/uL — ABNORMAL LOW (ref 150–400)
RBC: 3.64 MIL/uL — ABNORMAL LOW (ref 4.22–5.81)
RDW: 13.1 % (ref 11.5–15.5)
WBC Count: 18.9 10*3/uL — ABNORMAL HIGH (ref 4.0–10.5)
nRBC: 0.1 % (ref 0.0–0.2)

## 2019-09-15 LAB — CMP (CANCER CENTER ONLY)
ALT: 52 U/L — ABNORMAL HIGH (ref 0–44)
AST: 35 U/L (ref 15–41)
Albumin: 3.2 g/dL — ABNORMAL LOW (ref 3.5–5.0)
Alkaline Phosphatase: 210 U/L — ABNORMAL HIGH (ref 38–126)
Anion gap: 10 (ref 5–15)
BUN: 10 mg/dL (ref 8–23)
CO2: 25 mmol/L (ref 22–32)
Calcium: 8.9 mg/dL (ref 8.9–10.3)
Chloride: 102 mmol/L (ref 98–111)
Creatinine: 0.95 mg/dL (ref 0.61–1.24)
GFR, Est AFR Am: >60 mL/min (ref >60)
GFR, Estimated: >60 mL/min (ref >60)
Glucose, Bld: 137 mg/dL — ABNORMAL HIGH (ref 70–99)
Potassium: 4.1 mmol/L (ref 3.5–5.1)
Sodium: 137 mmol/L (ref 135–145)
Total Bilirubin: 0.5 mg/dL (ref 0.3–1.2)
Total Protein: 6.2 g/dL — ABNORMAL LOW (ref 6.5–8.1)

## 2019-09-16 ENCOUNTER — Telehealth: Payer: Self-pay | Admitting: Internal Medicine

## 2019-09-16 ENCOUNTER — Telehealth: Payer: Self-pay | Admitting: Cardiology

## 2019-09-16 NOTE — Telephone Encounter (Signed)
I spoke with patient's wife. She reports patient has been having increased shortness of breath. Had been on oxygen in the past. BP and heart rate are fine.  Oxygen sat is OK.  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   Has been seen by pulmonary.  I asked patient's wife to contact pulmonary regarding shortness of breath and possible need for oxygen. Patient had to cancel appointment with Dr Curt Bears for July 13. Is currently scheduled for October. Would like sooner appointment.  I scheduled patient to see Rebecca Eaton on August 6,2021 at 12:35.

## 2019-09-16 NOTE — Telephone Encounter (Signed)
New message  Pt c/o Shortness Of Breath: STAT if SOB developed within the last 24 hours or pt is noticeably SOB on the phone  1. Are you currently SOB (can you hear that pt is SOB on the phone)? No  2. How long have you been experiencing SOB? "A long time"  3. Are you SOB when sitting or when up moving around? Both  4. Are you currently experiencing any other symptoms? Difficulty breathing even though oxygen level is staying normal, panting. Patient's wife would like to discuss portable oxygen options. Please call back.

## 2019-09-17 ENCOUNTER — Emergency Department (HOSPITAL_COMMUNITY): Payer: Medicare Other

## 2019-09-17 ENCOUNTER — Encounter (HOSPITAL_COMMUNITY): Payer: Self-pay

## 2019-09-17 ENCOUNTER — Other Ambulatory Visit: Payer: Self-pay

## 2019-09-17 ENCOUNTER — Inpatient Hospital Stay (HOSPITAL_COMMUNITY)
Admission: EM | Admit: 2019-09-17 | Discharge: 2019-09-25 | DRG: 291 | Disposition: A | Discharge status: Home Health | Payer: Medicare Other | Attending: Internal Medicine | Admitting: Internal Medicine

## 2019-09-17 DIAGNOSIS — I4819 Other persistent atrial fibrillation: Secondary | ICD-10-CM | POA: Diagnosis present

## 2019-09-17 DIAGNOSIS — D72829 Elevated white blood cell count, unspecified: Secondary | ICD-10-CM | POA: Diagnosis present

## 2019-09-17 DIAGNOSIS — J9621 Acute and chronic respiratory failure with hypoxia: Secondary | ICD-10-CM | POA: Diagnosis not present

## 2019-09-17 DIAGNOSIS — T458X5A Adverse effect of other primarily systemic and hematological agents, initial encounter: Secondary | ICD-10-CM | POA: Diagnosis not present

## 2019-09-17 DIAGNOSIS — Z85118 Personal history of other malignant neoplasm of bronchus and lung: Secondary | ICD-10-CM

## 2019-09-17 DIAGNOSIS — Z66 Do not resuscitate: Secondary | ICD-10-CM | POA: Diagnosis not present

## 2019-09-17 DIAGNOSIS — R0600 Dyspnea, unspecified: Secondary | ICD-10-CM

## 2019-09-17 DIAGNOSIS — Z96611 Presence of right artificial shoulder joint: Secondary | ICD-10-CM | POA: Diagnosis not present

## 2019-09-17 DIAGNOSIS — Z87891 Personal history of nicotine dependence: Secondary | ICD-10-CM | POA: Diagnosis not present

## 2019-09-17 DIAGNOSIS — E66811 Obesity, class 1: Secondary | ICD-10-CM

## 2019-09-17 DIAGNOSIS — R002 Palpitations: Secondary | ICD-10-CM | POA: Diagnosis present

## 2019-09-17 DIAGNOSIS — R0602 Shortness of breath: Secondary | ICD-10-CM

## 2019-09-17 DIAGNOSIS — I2510 Atherosclerotic heart disease of native coronary artery without angina pectoris: Secondary | ICD-10-CM | POA: Diagnosis present

## 2019-09-17 DIAGNOSIS — E669 Obesity, unspecified: Secondary | ICD-10-CM | POA: Diagnosis not present

## 2019-09-17 DIAGNOSIS — E785 Hyperlipidemia, unspecified: Secondary | ICD-10-CM | POA: Diagnosis present

## 2019-09-17 DIAGNOSIS — Z96651 Presence of right artificial knee joint: Secondary | ICD-10-CM | POA: Diagnosis not present

## 2019-09-17 DIAGNOSIS — I110 Hypertensive heart disease with heart failure: Principal | ICD-10-CM | POA: Diagnosis present

## 2019-09-17 DIAGNOSIS — R296 Repeated falls: Secondary | ICD-10-CM | POA: Diagnosis present

## 2019-09-17 DIAGNOSIS — C7800 Secondary malignant neoplasm of unspecified lung: Secondary | ICD-10-CM | POA: Diagnosis present

## 2019-09-17 DIAGNOSIS — I480 Paroxysmal atrial fibrillation: Secondary | ICD-10-CM | POA: Diagnosis present

## 2019-09-17 DIAGNOSIS — J9690 Respiratory failure, unspecified, unspecified whether with hypoxia or hypercapnia: Secondary | ICD-10-CM

## 2019-09-17 DIAGNOSIS — J9691 Respiratory failure, unspecified with hypoxia: Secondary | ICD-10-CM

## 2019-09-17 DIAGNOSIS — Z8673 Personal history of transient ischemic attack (TIA), and cerebral infarction without residual deficits: Secondary | ICD-10-CM | POA: Diagnosis not present

## 2019-09-17 DIAGNOSIS — Z809 Family history of malignant neoplasm, unspecified: Secondary | ICD-10-CM | POA: Diagnosis not present

## 2019-09-17 DIAGNOSIS — C787 Secondary malignant neoplasm of liver and intrahepatic bile duct: Secondary | ICD-10-CM | POA: Diagnosis not present

## 2019-09-17 DIAGNOSIS — J9601 Acute respiratory failure with hypoxia: Secondary | ICD-10-CM | POA: Diagnosis present

## 2019-09-17 DIAGNOSIS — F419 Anxiety disorder, unspecified: Secondary | ICD-10-CM | POA: Diagnosis present

## 2019-09-17 DIAGNOSIS — I5033 Acute on chronic diastolic (congestive) heart failure: Secondary | ICD-10-CM | POA: Diagnosis present

## 2019-09-17 DIAGNOSIS — J441 Chronic obstructive pulmonary disease with (acute) exacerbation: Secondary | ICD-10-CM | POA: Diagnosis present

## 2019-09-17 DIAGNOSIS — Z6830 Body mass index (BMI) 30.0-30.9, adult: Secondary | ICD-10-CM

## 2019-09-17 DIAGNOSIS — Z9221 Personal history of antineoplastic chemotherapy: Secondary | ICD-10-CM

## 2019-09-17 DIAGNOSIS — Z20822 Contact with and (suspected) exposure to covid-19: Secondary | ICD-10-CM | POA: Diagnosis not present

## 2019-09-17 DIAGNOSIS — Z8249 Family history of ischemic heart disease and other diseases of the circulatory system: Secondary | ICD-10-CM

## 2019-09-17 DIAGNOSIS — I10 Essential (primary) hypertension: Secondary | ICD-10-CM | POA: Diagnosis present

## 2019-09-17 DIAGNOSIS — C3490 Malignant neoplasm of unspecified part of unspecified bronchus or lung: Secondary | ICD-10-CM | POA: Diagnosis present

## 2019-09-17 LAB — COMPREHENSIVE METABOLIC PANEL
ALT: 66 U/L — ABNORMAL HIGH (ref 0–44)
AST: 58 U/L — ABNORMAL HIGH (ref 15–41)
Albumin: 3 g/dL — ABNORMAL LOW (ref 3.5–5.0)
Alkaline Phosphatase: 219 U/L — ABNORMAL HIGH (ref 38–126)
Anion gap: 11 (ref 5–15)
BUN: 8 mg/dL (ref 8–23)
CO2: 23 mmol/L (ref 22–32)
Calcium: 8.6 mg/dL — ABNORMAL LOW (ref 8.9–10.3)
Chloride: 102 mmol/L (ref 98–111)
Creatinine, Ser: 0.9 mg/dL (ref 0.61–1.24)
GFR calc Af Amer: >60 mL/min (ref >60)
GFR calc non Af Amer: >60 mL/min (ref >60)
Glucose, Bld: 127 mg/dL — ABNORMAL HIGH (ref 70–99)
Potassium: 4.1 mmol/L (ref 3.5–5.1)
Sodium: 136 mmol/L (ref 135–145)
Total Bilirubin: 0.8 mg/dL (ref 0.3–1.2)
Total Protein: 5.9 g/dL — ABNORMAL LOW (ref 6.5–8.1)

## 2019-09-17 LAB — CBC WITH DIFFERENTIAL/PLATELET
Abs Immature Granulocytes: 3.9 10*3/uL — ABNORMAL HIGH (ref 0.00–0.07)
Basophils Absolute: 0.1 10*3/uL (ref 0.0–0.1)
Basophils Relative: 0 %
Eosinophils Absolute: 0.1 10*3/uL (ref 0.0–0.5)
Eosinophils Relative: 0 %
HCT: 35.1 % — ABNORMAL LOW (ref 39.0–52.0)
Hemoglobin: 11.4 g/dL — ABNORMAL LOW (ref 13.0–17.0)
Immature Granulocytes: 15 %
Lymphocytes Relative: 6 %
Lymphs Abs: 1.4 10*3/uL (ref 0.7–4.0)
MCH: 31.8 pg (ref 26.0–34.0)
MCHC: 32.5 g/dL (ref 30.0–36.0)
MCV: 97.8 fL (ref 80.0–100.0)
Monocytes Absolute: 2.7 10*3/uL — ABNORMAL HIGH (ref 0.1–1.0)
Monocytes Relative: 10 %
Neutro Abs: 18 10*3/uL — ABNORMAL HIGH (ref 1.7–7.7)
Neutrophils Relative %: 69 %
Platelets: 110 10*3/uL — ABNORMAL LOW (ref 150–400)
RBC: 3.59 MIL/uL — ABNORMAL LOW (ref 4.22–5.81)
RDW: 13.6 % (ref 11.5–15.5)
WBC: 26.1 10*3/uL — ABNORMAL HIGH (ref 4.0–10.5)
nRBC: 0.2 % (ref 0.0–0.2)

## 2019-09-17 LAB — URINALYSIS, ROUTINE W REFLEX MICROSCOPIC
Bilirubin Urine: NEGATIVE
Glucose, UA: NEGATIVE mg/dL
Hgb urine dipstick: NEGATIVE
Ketones, ur: NEGATIVE mg/dL
Leukocytes,Ua: NEGATIVE
Nitrite: NEGATIVE
Protein, ur: NEGATIVE mg/dL
Specific Gravity, Urine: >1.046 — ABNORMAL HIGH (ref 1.005–1.030)
pH: 5 (ref 5.0–8.0)

## 2019-09-17 LAB — TROPONIN I (HIGH SENSITIVITY)
Troponin I (High Sensitivity): 17 ng/L (ref <18)
Troponin I (High Sensitivity): 21 ng/L — ABNORMAL HIGH (ref <18)

## 2019-09-17 LAB — SARS CORONAVIRUS 2 BY RT PCR (HOSPITAL ORDER, PERFORMED IN ~~LOC~~ HOSPITAL LAB): SARS Coronavirus 2: NEGATIVE

## 2019-09-17 LAB — BRAIN NATRIURETIC PEPTIDE: B Natriuretic Peptide: 586.1 pg/mL — ABNORMAL HIGH (ref 0.0–100.0)

## 2019-09-17 MED ORDER — PRESERVISION AREDS 2 PO CAPS: 1.0000 | ORAL_CAPSULE | Freq: Two times a day (BID) | ORAL | Status: DC

## 2019-09-17 MED ORDER — MAGNESIUM OXIDE 400 (241.3 MG) MG PO TABS
400.0000 mg | ORAL_TABLET | Freq: Two times a day (BID) | ORAL | Status: DC
  Administered 2019-09-17 – 2019-09-25 (×16): 400 mg via ORAL
  Filled 2019-09-17 (×16): qty 1

## 2019-09-17 MED ORDER — PREDNISONE 20 MG PO TABS
40.0000 mg | ORAL_TABLET | Freq: Every day | ORAL | Status: DC
  Administered 2019-09-18 – 2019-09-19 (×2): 40 mg via ORAL
  Filled 2019-09-17 (×2): qty 2

## 2019-09-17 MED ORDER — APIXABAN 5 MG PO TABS
5.0000 mg | ORAL_TABLET | Freq: Two times a day (BID) | ORAL | Status: DC
  Administered 2019-09-17 – 2019-09-25 (×16): 5 mg via ORAL
  Filled 2019-09-17 (×17): qty 1

## 2019-09-17 MED ORDER — IPRATROPIUM-ALBUTEROL 0.5-2.5 (3) MG/3ML IN SOLN
3.0000 mL | Freq: Four times a day (QID) | RESPIRATORY_TRACT | Status: DC
  Administered 2019-09-17 (×2): 3 mL via RESPIRATORY_TRACT
  Filled 2019-09-17 (×3): qty 3

## 2019-09-17 MED ORDER — PANTOPRAZOLE SODIUM 40 MG PO TBEC
40.0000 mg | DELAYED_RELEASE_TABLET | Freq: Every day | ORAL | Status: DC
  Administered 2019-09-17 – 2019-09-25 (×8): 40 mg via ORAL
  Filled 2019-09-17 (×8): qty 1

## 2019-09-17 MED ORDER — ALBUTEROL SULFATE (2.5 MG/3ML) 0.083% IN NEBU
2.5000 mg | INHALATION_SOLUTION | RESPIRATORY_TRACT | Status: DC | PRN
  Filled 2019-09-17: qty 3

## 2019-09-17 MED ORDER — FERROUS SULFATE 325 (65 FE) MG PO TABS
325.0000 mg | ORAL_TABLET | Freq: Two times a day (BID) | ORAL | Status: DC
  Administered 2019-09-17 – 2019-09-25 (×17): 325 mg via ORAL
  Filled 2019-09-17 (×17): qty 1

## 2019-09-17 MED ORDER — LORAZEPAM 2 MG/ML IJ SOLN
0.5000 mg | Freq: Once | INTRAMUSCULAR | Status: AC
  Administered 2019-09-17: 0.5 mg via INTRAVENOUS
  Filled 2019-09-17: qty 1

## 2019-09-17 MED ORDER — LACTATED RINGERS IV SOLN: INTRAVENOUS | Status: DC

## 2019-09-17 MED ORDER — METHYLPREDNISOLONE SODIUM SUCC 125 MG IJ SOLR
80.0000 mg | Freq: Two times a day (BID) | INTRAMUSCULAR | Status: AC
  Administered 2019-09-17 (×2): 80 mg via INTRAVENOUS
  Filled 2019-09-17 (×2): qty 2

## 2019-09-17 MED ORDER — MECLIZINE HCL 25 MG PO TABS
25.0000 mg | ORAL_TABLET | Freq: Three times a day (TID) | ORAL | Status: DC | PRN
  Administered 2019-09-21: 25 mg via ORAL
  Filled 2019-09-17: qty 1

## 2019-09-17 MED ORDER — METOPROLOL TARTRATE 25 MG PO TABS
25.0000 mg | ORAL_TABLET | Freq: Two times a day (BID) | ORAL | Status: DC
  Administered 2019-09-17 – 2019-09-23 (×13): 25 mg via ORAL
  Filled 2019-09-17 (×8): qty 1
  Filled 2019-09-17: qty 2
  Filled 2019-09-17 (×4): qty 1

## 2019-09-17 MED ORDER — BACLOFEN 10 MG PO TABS
10.0000 mg | ORAL_TABLET | Freq: Two times a day (BID) | ORAL | Status: DC
  Administered 2019-09-17 – 2019-09-25 (×16): 10 mg via ORAL
  Filled 2019-09-17 (×18): qty 1

## 2019-09-17 MED ORDER — LORAZEPAM 2 MG/ML IJ SOLN: 0.5000 mg | Freq: Once | INTRAMUSCULAR | Status: DC | PRN

## 2019-09-17 MED ORDER — IPRATROPIUM-ALBUTEROL 0.5-2.5 (3) MG/3ML IN SOLN
3.0000 mL | Freq: Once | RESPIRATORY_TRACT | Status: AC
  Administered 2019-09-17: 3 mL via RESPIRATORY_TRACT
  Filled 2019-09-17: qty 3

## 2019-09-17 MED ORDER — POLYVINYL ALCOHOL 1.4 % OP SOLN: 1.0000 [drp] | Freq: Two times a day (BID) | OPHTHALMIC | Status: DC | PRN

## 2019-09-17 MED ORDER — LORAZEPAM 2 MG/ML IJ SOLN
0.5000 mg | INTRAMUSCULAR | Status: DC | PRN
  Administered 2019-09-17: 0.5 mg via INTRAVENOUS
  Filled 2019-09-17 (×3): qty 1

## 2019-09-17 MED ORDER — SENNOSIDES-DOCUSATE SODIUM 8.6-50 MG PO TABS: 1.0000 | ORAL_TABLET | Freq: Every evening | ORAL | Status: DC | PRN

## 2019-09-17 MED ORDER — AMIODARONE HCL 200 MG PO TABS
200.0000 mg | ORAL_TABLET | Freq: Every day | ORAL | Status: DC
  Administered 2019-09-17 – 2019-09-23 (×6): 200 mg via ORAL
  Filled 2019-09-17 (×7): qty 1

## 2019-09-17 MED ORDER — CALCIUM POLYCARBOPHIL 625 MG PO TABS
1250.0000 mg | ORAL_TABLET | Freq: Every day | ORAL | Status: DC
  Administered 2019-09-17 – 2019-09-24 (×8): 1250 mg via ORAL
  Filled 2019-09-17 (×9): qty 2

## 2019-09-17 MED ORDER — PAROXETINE HCL 20 MG PO TABS
40.0000 mg | ORAL_TABLET | Freq: Every day | ORAL | Status: DC
  Administered 2019-09-17 – 2019-09-24 (×8): 40 mg via ORAL
  Filled 2019-09-17 (×8): qty 2

## 2019-09-17 MED ORDER — IOHEXOL 350 MG/ML SOLN
75.0000 mL | Freq: Once | INTRAVENOUS | Status: AC | PRN
  Administered 2019-09-17: 75 mL via INTRAVENOUS

## 2019-09-17 MED ORDER — IRBESARTAN 150 MG PO TABS
150.0000 mg | ORAL_TABLET | Freq: Every day | ORAL | Status: DC
  Administered 2019-09-17 – 2019-09-18 (×2): 150 mg via ORAL
  Filled 2019-09-17 (×2): qty 1

## 2019-09-17 MED ORDER — TAMSULOSIN HCL 0.4 MG PO CAPS
0.4000 mg | ORAL_CAPSULE | Freq: Every day | ORAL | Status: DC
  Administered 2019-09-17: 0.4 mg via ORAL
  Filled 2019-09-17: qty 1

## 2019-09-17 MED ORDER — ROSUVASTATIN CALCIUM 5 MG PO TABS
5.0000 mg | ORAL_TABLET | Freq: Every day | ORAL | Status: DC
  Administered 2019-09-17 – 2019-09-25 (×8): 5 mg via ORAL
  Filled 2019-09-17 (×8): qty 1

## 2019-09-17 MED ORDER — OXYCODONE-ACETAMINOPHEN 5-325 MG PO TABS: 1.0000 | ORAL_TABLET | Freq: Four times a day (QID) | ORAL | Status: DC | PRN

## 2019-09-17 MED ORDER — PROSIGHT PO TABS
1.0000 | ORAL_TABLET | Freq: Every day | ORAL | Status: DC
  Administered 2019-09-17 – 2019-09-25 (×8): 1 via ORAL
  Filled 2019-09-17 (×8): qty 1

## 2019-09-17 MED ORDER — DOXYCYCLINE HYCLATE 100 MG PO TABS
100.0000 mg | ORAL_TABLET | Freq: Two times a day (BID) | ORAL | Status: AC
  Administered 2019-09-17 – 2019-09-21 (×9): 100 mg via ORAL
  Filled 2019-09-17 (×11): qty 1

## 2019-09-17 MED ORDER — FUROSEMIDE 10 MG/ML IJ SOLN
20.0000 mg | Freq: Once | INTRAMUSCULAR | Status: AC
  Administered 2019-09-17: 20 mg via INTRAVENOUS
  Filled 2019-09-17: qty 2

## 2019-09-17 MED ORDER — IPRATROPIUM-ALBUTEROL 0.5-2.5 (3) MG/3ML IN SOLN
3.0000 mL | Freq: Three times a day (TID) | RESPIRATORY_TRACT | Status: DC
  Administered 2019-09-18 – 2019-09-20 (×8): 3 mL via RESPIRATORY_TRACT
  Filled 2019-09-17 (×7): qty 3

## 2019-09-17 MED ORDER — MELATONIN 5 MG PO TABS
10.0000 mg | ORAL_TABLET | Freq: Every evening | ORAL | Status: DC | PRN
  Filled 2019-09-17: qty 2

## 2019-09-17 NOTE — Telephone Encounter (Signed)
Spoke with the pt's spouse  She states pt was having SOB and she wanted him to have o2- he was not already using  He was eventually taken to the hospital last night and was admitted and started on o2  I advised that we would have had to see and evaluate the pt to start him on o2  She understands  Will call for f/u here once he is d/c  Nothing further needed

## 2019-09-17 NOTE — H&P (Signed)
History and Physical    George Vega GYK:[id number] DOB: December 22, 1940 DOA: 09/17/2019  PCP: [name], MD Consultants:  [facility] - cardiology; [facility] - pulmonology; [facility] - oncology Patient coming from:  Home - lives with wife and daughter and her 2 sons; NOK: Wife, Leevon Upperman, [phone], [phone]; Daughter, Juliann Pares, [phone]   Chief Complaint: SOB  HPI: George Vega is a 79 y.o. male with medical history significant of CVA; stage 1 RLL SCC; HTN; CAD; afib; and CHF presenting with SOB.  He has been having trouble.  He started chemo 2 1/2 weeks ago.  Since then, he has been having problems with his breathing, more pronounced.  It was intermittent.  A few days ago, he started with panting, wheezing.  Difficulty sleeping and staying awake.  It got worse last night and they had to come in.  +audible wheezing.  + cough.  He does not wear home O2 but they think he needs it.  His pulse ox has been in the 90s at home.  +LE edema, L >R, intermittently.  No obvious orthopnea.  +PND.  Panting with minimal exertion.  No fevers.  Mild rhinorrhea x 2 days.    ED Course:  Carryover, per Dr. Hal Hope:  79 year old male with known history of lung cancer A. fib CHF presents to the ER because of worsening shortness of breath. CT angiogram of the chest was negative for PE BNP is elevated more than 500 because of shortness of breath is not clear patient is hypoxic satting in the 80s. DuoNeb's and 1 dose of IV Lasix is being given admitted for further observation. Patient's leukocytosis could be from recent Neulasta possible. No definite evidence of any infection.   Review of Systems: As per HPI; otherwise review of systems reviewed and negative.   Ambulatory Status:  Ambulates with a walker  COVID Vaccine Status:  Complete  Past Medical History:  Diagnosis Date  . Arthritis   . Atrial fibrillation, persistent ([place])    on Eliquis  . Cancer ([place])    liver  . CHF  (congestive heart failure) ([place])   . Chronic sinusitis   . Coronary artery disease   . Depression   . Difficult intubation    was told with shoulder done 2006-alittle narrow  . 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 Hypertension   . Jejunostomy tube fell out    when asked about this in 04/2017, pt denied ever having a J tube, feeding tube, tubes placed post surgery so ??? veracity of a previous J tube.    . 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 Spastic colon   . Squamous cell carcinoma of lung, stage I, right ([place]) 05/07/2018   bx 04/29/18; isolated PET uptake in RLL mass  . Stroke (cerebrum) ([place])   . SVT (supraventricular tachycardia) (HCC)     Past Surgical History:  Procedure Laterality Date  . ATRIAL FIBRILLATION ABLATION N/A 02/11/2019   Procedure: ATRIAL FIBRILLATION ABLATION;  Surgeon: Constance Haw, MD;  Location: [place] [facility];  Service: Cardiovascular;  Laterality: N/A;  . CARDIAC CATHETERIZATION    . CATARACT EXTRACTION, BILATERAL    . COLONOSCOPY WITH PROPOFOL N/A 04/17/2017   Procedure: COLONOSCOPY WITH PROPOFOL;  Surgeon: Yetta Flock, MD;  Location: [place];  Service: Gastroenterology;  Laterality: N/A;  . ESOPHAGEAL DILATION  2017  . ESOPHAGOGASTRODUODENOSCOPY (EGD) WITH PROPOFOL N/A 04/17/2017   Procedure: ESOPHAGOGASTRODUODENOSCOPY (EGD) WITH PROPOFOL;  Surgeon: Yetta Flock, MD;  [facility];  Service: Gastroenterology;  Laterality: N/A;  . EYE SURGERY    . FOOT NEUROMA SURGERY  2002  . LEFT HEART CATH AND CORONARY ANGIOGRAPHY N/A 01/16/2019   Procedure: LEFT HEART CATH AND CORONARY ANGIOGRAPHY;  Surgeon: Jettie Booze, MD;  Location: [place] [facility];  Service: Cardiovascular;  Laterality: N/A;  . LEFT HEART CATHETERIZATION WITH CORONARY ANGIOGRAM Right 06/14/2011   20% LM, chronic occluded mid LAD, 50% ostial LCX, 20% mid RI, RCA with collaterals to mid LAD, mid 10% stenosis, EF 60% 06/14/11  . NASAL SINUS  SURGERY    . SHOULDER ARTHROSCOPY  03/01/2011   Procedure: ARTHROSCOPY SHOULDER;  Surgeon: Ninetta Lights, MD;  Location: [place];  Service: Orthopedics;  Laterality: Right;  arthroscopy shoulder decompression subacromial partial acromioplasty with coracoacromial release, distal claviculectomy, debridement of labrium  . SHOULDER ARTHROSCOPY  03/01/2011   Procedure: ARTHROSCOPY SHOULDER;  Surgeon: Ninetta Lights, MD;  Location: [place];  Service: Orthopedics;  Laterality: Right;  arthroscopy shoulder decompression subacromial partial acromioplasty with coracoacromial release, distal claviculectomy, debridement of labrium  . TEE WITHOUT CARDIOVERSION N/A 03/19/2016   Procedure: TRANSESOPHAGEAL ECHOCARDIOGRAM (TEE);  Surgeon: Larey Dresser, MD;  Location: [place];  Service: Cardiovascular;  Laterality: N/A;  . TOTAL KNEE ARTHROPLASTY Right 09/23/2013   Procedure: TOTAL KNEE ARTHROPLASTY;  Surgeon: Ninetta Lights, MD;  Location: [place];  Service: Orthopedics;  Laterality: Right;  . TOTAL SHOULDER ARTHROPLASTY  06/28/2011   Procedure: TOTAL SHOULDER ARTHROPLASTY;  Surgeon: Ninetta Lights, MD;  Location: [place];  Service: Orthopedics;  Laterality: Right;  . UVULOPALATOPHARYNGOPLASTY    . VIDEO ASSISTED THORACOSCOPY (VATS)/WEDGE RESECTION Right 06/02/2018   Procedure: VIDEO ASSISTED THORACOSCOPY (VATS)/WEDGE RESECTION with Lymph node disection and intercostal nerve block.;  Surgeon: [name], MD;  Location: [place];  Service: Thoracic;  Laterality: Right;    Social History   Socioeconomic History  . Marital status: Married    Spouse name: Not on file  . Number of children: 3  . Years of education: Not on file  . Highest education level: Not on file  Occupational History  . Occupation: retired  Tobacco Use  . Smoking status: Former Smoker    Packs/day: 2.00    Years: 30.00    Pack years: 60.00    Types: Cigarettes    Quit  date: 02/27/1998    Years since quitting: 21.5  . Smokeless tobacco: Never Used  Vaping Use  . Vaping Use: Never used  Substance and Sexual Activity  . Alcohol use: Yes    Alcohol/week: 14.0 standard drinks    Types: 14 Standard drinks or equivalent per week    Comment: 2 drinks daily ([place])  . Drug use: No  . Sexual activity: Not on file  Other Topics Concern  . Not on file  Social History Narrative   ** Merged History Encounter **       Patient is left handed. Patient drinks one cup caffeine daily.   Social Determinants of Health   Financial Resource Strain:   . Difficulty of Paying Living Expenses:   Food Insecurity:   . Worried About Charity fundraiser in the Last Year:   . Arboriculturist in the Last Year:   Transportation Needs:   . Film/video editor (Medical):   Marland Kitchen Lack of Transportation (Non-Medical):   Physical Activity:   . Days of Exercise per Week:   .  Minutes of Exercise per Session:   Stress:   . Feeling of Stress :   Social Connections:   . Frequency of Communication with Friends and Family:   . Frequency of Social Gatherings with Friends and Family:   . Attends Religious Services:   . Active Member of Clubs or Organizations:   . Attends Archivist Meetings:   Marland Kitchen Marital Status:   Intimate Partner Violence:   . Fear of Current or Ex-Partner:   . Emotionally Abused:   Marland Kitchen Physically Abused:   . Sexually Abused:     Allergies  Allergen Reactions  . Glucosamine-Chondroitin Anaphylaxis and Other (See Comments)    Stomach cramps, can't eat   . Tetanus Toxoid Anaphylaxis and Hives    hives/throat swells shut   . Fish Oil Other (See Comments)    Stomach cramps, can't eat   . Ibuprofen Nausea Only and Other (See Comments)    Stomach pains   . Naproxen Diarrhea  . Glucosamine-Chondroitin Other (See Comments)    Cramps  . Ibuprofen Other (See Comments)    Stomach cramps  . Naproxen Other (See Comments)    Stomach cramps     Family History  Problem Relation Age of Onset  . Cancer Mother        colon  . Heart attack Father   . Migraines Brother     Prior to Admission medications   Medication Sig Start Date End Date Taking? Authorizing Provider  acetaminophen (TYLENOL) 325 MG tablet Take 650 mg by mouth 2 (two) times daily as needed for mild pain or headache.   Yes [provider]  amiodarone (PACERONE) 200 MG tablet Take 200 mg by mouth daily. 07/22/19  Yes [provider]  baclofen (LIORESAL) 10 MG tablet Take 1 tablet (10 mg total) by mouth 2 (two) times daily. 09/03/19  Yes Tanner, Lyndon Code., PA-C  Cholecalciferol 125 MCG (5000 UT) TABS Take 1 tablet by mouth daily.   Yes [provider]  ELIQUIS 5 MG TABS tablet Take 5 mg by mouth 2 (two) times daily. 06/20/19  Yes [provider]  ferrous sulfate (FEROSUL) 325 (65 FE) MG tablet Take 1 tablet (325 mg total) by mouth 2 (two) times daily with a meal. 06/06/18  Yes Tacy Dura, Donielle M, PA-C  fluticasone (FLONASE) 50 MCG/ACT nasal spray Place 1-2 sprays into both nostrils daily as needed for allergies or rhinitis.   Yes [provider]  furosemide (LASIX) 40 MG tablet Take 1 tablet by mouth every Monday, Wednesday, and Friday. 03/10/19  Yes [provider]  irbesartan (AVAPRO) 150 MG tablet Take 150 mg by mouth daily. 06/20/19  Yes [provider]  magnesium oxide (MAG-OX) 400 MG tablet Take 1 tablet (400 mg total) by mouth 2 (two) times daily. 02/17/19  Yes [name], MD  meclizine (ANTIVERT) 25 MG tablet Take 1 tablet (25 mg total) by mouth 3 (three) times daily as needed for dizziness. 0/62/69  Yes Delora Fuel, MD  Melatonin 10 MG TABS Take 1 tablet by mouth at bedtime as needed (Sleep).   Yes [provider]  metoprolol tartrate (LOPRESSOR) 25 MG tablet Take 1 tablet (25 mg total) by mouth 2 (two) times daily. 05/27/19  Yes Camnitz, Will Hassell Done, MD  Multiple Vitamins-Minerals  (PRESERVISION AREDS 2) CAPS Take 1 capsule by mouth 2 (two) times daily.   Yes [provider]  oxyCODONE-acetaminophen (PERCOCET/ROXICET) 5-325 MG tablet Take 1 tablet by mouth every 6 (six) hours  as needed for severe pain. 09/02/19  Yes Long, Wonda Olds, MD  pantoprazole (PROTONIX) 40 MG tablet Take 40 mg by mouth daily. 07/22/19  Yes [provider]  PARoxetine (PAXIL) 40 MG tablet Take 40 mg by mouth at bedtime. 05/18/19  Yes [provider]  polycarbophil (FIBERCON) 625 MG tablet Take 1,250 mg by mouth at bedtime.    Yes [provider]  Polyethyl Glycol-Propyl Glycol (SYSTANE OP) Place 2 drops into both eyes 2 (two) times daily as needed (dry eyes).    Yes [provider]  Potassium Chloride ER 20 MEQ TBCR Take 1 tablet by mouth every Monday, Wednesday, and Friday. Take with furosemide. 02/17/19  Yes [provider]  prochlorperazine (COMPAZINE) 10 MG tablet Take 1 tablet (10 mg total) by mouth every 6 (six) hours as needed. 08/31/19  Yes Heilingoetter, Cassandra L, PA-C  rosuvastatin (CRESTOR) 5 MG tablet TAKE 1 TABLET BY MOUTH IN  THE MORNING Patient taking differently: Take 5 mg by mouth daily.  09/02/19  Yes Evans Lance, MD  senna-docusate (SENOKOT-S) 8.6-50 MG tablet Take 1 tablet by mouth at bedtime as needed for mild constipation or moderate constipation. 09/02/19  Yes Long, Wonda Olds, MD  tamsulosin (FLOMAX) 0.4 MG CAPS capsule Take 0.4 mg by mouth at bedtime.  12/04/16  Yes [provider]    Physical Exam: Vitals:   09/17/19 0415 09/17/19 0605 09/17/19 0641 09/17/19 0800  BP: 117/79 (!) 116/91 137/85 128/80  Pulse: 96  (!) 103 (!) 103  Resp: (!) 38 (!) 26 (!) 33 (!) 31  Temp:  98.7 F (37.1 C)    TempSrc:  Oral    SpO2: 95% 96% 95% 94%  Weight:      Height:         . General:  Appears calm and comfortable and is NAD; appears fatigued . Eyes:   EOMI, normal lids, iris . ENT:  grossly normal hearing, lips & tongue,  mmm; appropriate dentition . Neck:  no LAD, masses or thyromegaly . Cardiovascular:  RRR, no m/r/g. No LE edema.  Marland Kitchen Respiratory:  Audible wheezing from the bedside, diminished air movement diffusely without apparent consolidation.  Mildly increased respiratory effort on [place] O2. . Abdomen:  soft, NT, ND, NABS . Skin:  no rash or induration seen on limited exam . Musculoskeletal:  grossly normal tone BUE/BLE, good ROM, no bony abnormality . Psychiatric:  Mildly anxious mood and affect, speech fluent and appropriate, AOx3 . Neurologic:  CN 2-12 grossly intact, moves all extremities in coordinated fashion    Radiological Exams on Admission: CT Angio Chest PE W/Cm &/Or Wo Cm  Result Date: 09/17/2019 CLINICAL DATA:  Hypoxemia EXAM: CT ANGIOGRAPHY CHEST WITH CONTRAST TECHNIQUE: Multidetector CT imaging of the chest was performed using the standard protocol during bolus administration of intravenous contrast. Multiplanar CT image reconstructions and MIPs were obtained to evaluate the vascular anatomy. CONTRAST:  35mL OMNIPAQUE IOHEXOL 350 MG/ML SOLN COMPARISON:  07/21/2019 PET-CT FINDINGS: Cardiovascular: Satisfactory opacification of the pulmonary arteries to the segmental level. No evidence of pulmonary embolism. Normal heart size. No pericardial effusion. Multifocal coronary atherosclerosis. Mediastinum/Nodes: Right hilar and subcarinal adenopathy that is improved from prior. A subcarinal node measures 14 mm in short axis compared to 24 mm previously. Lungs/Pleura: Tracheobronchomalacia. Postoperative right lower lobe with sutures along the posterior major fissure. Emphysema. There is no edema, consolidation, effusion, or pneumothorax. Upper Abdomen: Liver lesions described on comparison PET CT are not seen on this study which  is essentially a noncontrast phase of the liver. Musculoskeletal: No acute or aggressive finding. Pseudoarticulation between the upper right first 2 ribs. T2 and T3 superior endplate  sclerosis and mild height loss which is new from 07/06/2019. Review of the MIP images confirms the above findings. IMPRESSION: 1. Negative for pulmonary embolism or other acute finding. 2. History of lung cancer with improved right hilar and mediastinal adenopathy. 3. Aortic Atherosclerosis (ICD10-I70.0) and Emphysema (ICD10-J43.9). 4. Interval but nonacute superior endplate fractures at T2 and T3 when compared to July 06, 2019 CT. Electronically Signed   By: Monte Fantasia M.D.   On: 09/17/2019 06:10   DG Chest Portable 1 View  Result Date: 09/17/2019 CLINICAL DATA:  Dyspnea EXAM: PORTABLE CHEST 1 VIEW COMPARISON:  04/13/2019 FINDINGS: Cardiac shadow is enlarged. Aortic calcifications are again noted. Chronic fibrotic changes are seen bilaterally. No focal confluent infiltrate is seen. Bilateral shoulder replacements are noted. IMPRESSION: Chronic scarring without acute abnormality. Electronically Signed   By: Inez Catalina M.D.   On: 09/17/2019 02:31    EKG: Independently reviewed.  NSR with rate 98; nonspecific ST changes with no evidence of acute ischemia   Labs on Admission: I have personally reviewed the available labs and imaging studies at the time of the admission.  Pertinent labs:   Glucose 127 AP 219 Albumin 3.0 AST 58/ALT 66 BNP 586.1; 208.6 on 12/5 HS troponin 17, 21 WBC 26.1 Hgb 11.4 Platelets 110 UA unremarkable   Assessment/Plan Principal Problem:   Acute respiratory failure with hypoxia (HCC) Active Problems:   Hyperlipemia   HTN (hypertension)   Lung cancer (HCC)   Paroxysmal atrial fibrillation (HCC)   Obesity (BMI 30.0-34.9)   Acute on chronic respiratory failure associated with a COPD exacerbation -Patient without clear diagnosis of COPD but emphysema seen on imaging presenting with initially intermittent SOB and now severe and with minimal exertion, marked wheezing -CTA negative for PNA, PE -Mildly elevated BNP but no edema on PE or on CXR so CHF  exacerbation seems less likely -Patient's symptoms are most likely caused by acute COPD exacerbation.  -He does not have fever; leukocytosis is likely related to Neulasta.  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 Julien Nordmann has been notified of the admission, as well as of the plan to observe at [facility] -Pain control with Percocet, which was recently prescribed but does not appear to be a chronic medication for him based on review of the PDMP -Palliative care consultation/further code status discussion may be reasonable in the future  HTN -Continue Avapro, Lopressor  HLD -Continue Crestor  Afib -Rate controlled with Amiodarone, Lopressor -Continue Eliquis  Obesity -Body mass index is 31.76 kg/m. -Weight loss should be encouraged -Outpatient PCP/bariatric medicine f/u encouraged   Note: This patient has been tested and is negative for the novel coronavirus COVID-19.    DVT prophylaxis: Eliquis  Code Status:  Full - confirmed with patient/wife Family Communication: Wife was present throughout evaluation and voice mail message was left regarding plan for transfer to [facility]  Disposition Plan:  The patient is from: home  Anticipated d/c is to: home without [facility] services, although he may possibly benefit from home O2 (walk of life/ambulatory pulse ox requested for tomorrow AM)  Anticipated d/c date will depend on clinical response to treatment, but possibly in the next  1-3 days if he has excellent response to treatment  Patient is currently: acutely ill Consults called:  Oncology was notified of admission; PT/OT/Nutrition/RT  Admission status: It is my clinical opinion that referral for OBSERVATION is reasonable and necessary in this patient based on the above information provided. The aforementioned taken together are felt to place the patient at high risk for further clinical deterioration. However it is anticipated that the patient may be medically stable for discharge from the hospital within 24 to 48 hours.      Karmen Bongo MD [facility]   How to contact the [facility] Attending or Consulting provider [place] or covering provider during after hours [place], for this patient?  1. Check the care team in [facility] and look for a) attending/consulting [facility] provider listed and b) the [facility] team listed 2. Log into [email or web address] and use [place]'s universal password to access. If you do not have the password, please contact the hospital operator. 3. Locate the [facility] provider you are looking for under [facility] and page to a number that you can be directly reached. 4. If you still have difficulty reaching the provider, please page the [facility] (Director on Call) for the Hospitalists listed on amion for assistance.   09/17/2019, 9:02 AM

## 2019-09-17 NOTE — ED Notes (Signed)
[phone] Ward- daughter would like an update

## 2019-09-17 NOTE — ED Triage Notes (Signed)
Pt BIB  EMS from home with c.o palpations at rest with SOB,  Started at 0000. On EMS arrival pt sating 90 percent RA, placed on 4L [place] at 96 percent. Pt denies Chest pain .   HX afib , liver cancer , CHF

## 2019-09-17 NOTE — ED Provider Notes (Signed)
[facility] EMERGENCY DEPARTMENT Provider Note   CSN: [id number] Arrival date & time: 09/17/19  0058     History Chief Complaint  Patient presents with  . Palpitations  . Shortness of Breath    George Vega is a 79 y.o. 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  Patient states that he intermittently feels that his heart is beating fast and irregularly with associated shortness of breath.  No specific triggers or alleviating/aggravating factors.  He states this feels similar to when he has gone into A. fib before.  He states that he has required cardioversion and subsequently had an ablation a few years ago, does go in and out of this rhythm, however has been doing well on amiodarone.  He currently is not experiencing any significant palpitations.  He has had progressively worsening shortness of breath over the past few weeks since starting chemotherapy.  States is worse with activity.  His wife contacted his cardiologist and subsequently his pulmonologist today to discuss the possibility of obtaining supplemental oxygen to utilize at home as he is utilized this in the past, not currently on chronic oxygen.  He denies fever, chills, chest pain, syncope, leg pain/swelling, hemoptysis, recent surgery/trauma, recent long travel, hormone use, or hx of DVT/PE.  He currently takes Eliquis and has not missed any doses recently.   HPI     Past Medical History:  Diagnosis Date  . Arthritis   . Atrial fibrillation, persistent ([place])   . Cancer ([place])    liver  . CHF (congestive heart failure) ([place])   . Chronic sinusitis   . Coronary  artery disease   . Depression   . Difficult intubation    was told with shoulder done 2006-alittle narrow  . 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 Hypertension   . Jejunostomy tube fell out    when asked about this in 04/2017, pt denied ever having a J tube, feeding tube, tubes placed post surgery so ??? veracity of a previous J tube.    . 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 Spastic colon   . 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 Stroke (cerebrum) ([place])   . SVT (supraventricular tachycardia) [facility])     Patient Active Problem List   Diagnosis Date Noted  . Encounter for antineoplastic chemotherapy 08/28/2019  . Encounter for antineoplastic immunotherapy 08/28/2019  . Goals of care, counseling/discussion 08/28/2019  . Malignant poorly differentiated neuroendocrine carcinoma ([place]) 08/27/2019  . Elevated troponin   . Acute respiratory failure ([place]) 02/14/2019  . H/O cardiac radiofrequency ablation 02/14/2019  . Acute on chronic diastolic CHF (congestive heart failure) ([place])   . Paroxysmal atrial fibrillation ([place]) 02/11/2019  . Lung cancer ([place]) 06/02/2018  . Prostate nodule 05/15/2018  . 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 AVM (arteriovenous malformation) of colon without hemorrhage   . Gastric AVM   . Chronic anticoagulation   . Iron deficiency anemia due to chronic blood loss   . Symptomatic anemia 04/16/2017  . 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  Prediabetes 01/30/2017  . Vitamin D deficiency 01/30/2017  . Multiple pulmonary nodules determined by computed tomography of lung 10/09/2016  . Pulmonary nodules 10/09/2016  . Persistent atrial fibrillation ([place]) 03/19/2016  . Upper airway cough syndrome 01/21/2016  . Dyspnea on exertion 01/20/2016  . SVT (supraventricular tachycardia) ([place]) 11/01/2015  .  Atrial tachycardia ([place]) 09/08/2015  . 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 BPH (benign prostatic hyperplasia) 05/19/2014  . CVA (cerebral vascular accident) ([place]) 02/01/2014  . Benign paroxysmal positional vertigo 01/12/2014  . Headache 01/12/2014  . DTs (delirium tremens) ([place]) 09/26/2013  . 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 Ataxia 04/02/2013  . Leukocytosis 04/02/2013  . CAD (coronary artery disease) 04/02/2013  . Hyperlipemia 04/02/2013  . HTN (hypertension) 04/02/2013  . Abnormal MRA, brain 11/06/2012  . Arthralgia 09/02/2012  . Synovitis of wrist 07/25/2012  . OA (osteoarthritis) 07/25/2012  . Spastic colon 05/03/2011  . Arthritis 05/03/2011  . Coronary atherosclerosis 11/04/2008  . DEPRESSION 11/03/2008  . DYSTHYMIC DISORDER 10/13/2008  . STRESS ELECTROCARDIOGRAM, ABNORMAL 10/13/2008    Past Surgical History:  Procedure Laterality Date  . ATRIAL FIBRILLATION ABLATION N/A 02/11/2019   Procedure: ATRIAL FIBRILLATION ABLATION;  Surgeon: Constance Haw, MD;  Location: [place] [facility];  Service: Cardiovascular;  Laterality: N/A;  . CARDIAC CATHETERIZATION    . CATARACT EXTRACTION, BILATERAL    . COLONOSCOPY WITH PROPOFOL N/A 04/17/2017   Procedure: COLONOSCOPY WITH PROPOFOL;  Surgeon: Yetta Flock, MD;  Location: [place];  Service: Gastroenterology;  Laterality: N/A;  . ESOPHAGEAL DILATION  2017  . ESOPHAGOGASTRODUODENOSCOPY (EGD) WITH PROPOFOL N/A 04/17/2017   Procedure: ESOPHAGOGASTRODUODENOSCOPY (EGD) WITH PROPOFOL;  Surgeon: Yetta Flock, MD;  Location: [place];  Service: Gastroenterology;  Laterality: N/A;  . EYE SURGERY    . FOOT NEUROMA SURGERY  2002  . LEFT HEART CATH AND CORONARY ANGIOGRAPHY N/A 01/16/2019   Procedure: LEFT HEART CATH AND CORONARY ANGIOGRAPHY;  Surgeon: Jettie Booze, MD;  Location: [place] [facility];  Service: Cardiovascular;  Laterality: N/A;    . LEFT HEART CATHETERIZATION WITH CORONARY ANGIOGRAM Right 06/14/2011   20% LM, chronic occluded mid LAD, 50% ostial LCX, 20% mid RI, RCA with collaterals to mid LAD, mid 10% stenosis, EF 60% 06/14/11  . NASAL SINUS SURGERY    . SHOULDER ARTHROSCOPY  03/01/2011   Procedure: ARTHROSCOPY SHOULDER;  Surgeon: Ninetta Lights, MD;  Location: [place];  Service: Orthopedics;  Laterality: Right;  arthroscopy shoulder decompression subacromial partial acromioplasty with coracoacromial release, distal claviculectomy, debridement of labrium  . SHOULDER ARTHROSCOPY  03/01/2011   Procedure: ARTHROSCOPY SHOULDER;  Surgeon: Ninetta Lights, MD;  Location: [place];  Service: Orthopedics;  Laterality: Right;  arthroscopy shoulder decompression subacromial partial acromioplasty with coracoacromial release, distal claviculectomy, debridement of labrium  . TEE WITHOUT CARDIOVERSION N/A 03/19/2016   Procedure: TRANSESOPHAGEAL ECHOCARDIOGRAM (TEE);  Surgeon: Larey Dresser, MD;  Location: [place];  Service: Cardiovascular;  Laterality: N/A;  . TOTAL KNEE ARTHROPLASTY Right 09/23/2013   Procedure: TOTAL KNEE ARTHROPLASTY;  Surgeon: Ninetta Lights, MD;  Location: [place];  Service: Orthopedics;  Laterality: Right;  . TOTAL SHOULDER ARTHROPLASTY  06/28/2011   Procedure: TOTAL SHOULDER ARTHROPLASTY;  Surgeon: Ninetta Lights, MD;  Location: [place];  Service: Orthopedics;  Laterality: Right;  . UVULOPALATOPHARYNGOPLASTY    . VIDEO ASSISTED THORACOSCOPY (VATS)/WEDGE RESECTION Right 06/02/2018   Procedure: VIDEO ASSISTED  THORACOSCOPY (VATS)/WEDGE RESECTION with Lymph node disection and intercostal nerve block.;  Surgeon: [name], MD;  Location: [place] [facility];  Service: Thoracic;  Laterality: Right;       Family History  Problem Relation Age of Onset  . Cancer Mother        colon  . Heart attack Father   . Migraines Brother     Social History   Tobacco Use  .  Smoking status: Former Smoker    Packs/day: 2.00    Years: 30.00    Pack years: 60.00    Types: Cigarettes    Quit date: 02/27/1998    Years since quitting: 21.5  . Smokeless tobacco: Never Used  Vaping Use  . Vaping Use: Never used  Substance Use Topics  . Alcohol use: Yes    Alcohol/week: 14.0 standard drinks    Types: 14 Standard drinks or equivalent per week    Comment: 2 drinks daily ([place])  . Drug use: No    Home Medications Prior to Admission medications   Medication Sig Start Date End Date Taking? Authorizing Provider  acetaminophen (TYLENOL) 325 MG tablet Take 650 mg by mouth 2 (two) times daily as needed for mild pain or headache.    [provider]  amiodarone (PACERONE) 200 MG tablet Take 200 mg by mouth daily. 07/22/19   [provider]  baclofen (LIORESAL) 10 MG tablet Take 1 tablet (10 mg total) by mouth 2 (two) times daily. 09/03/19   Tanner, Lyndon Code., PA-C  Cholecalciferol 125 MCG (5000 UT) TABS Take 1 tablet by mouth daily.    [provider]  ELIQUIS 5 MG TABS tablet Take 5 mg by mouth 2 (two) times daily. 06/20/19   [provider]  ferrous sulfate (FEROSUL) 325 (65 FE) MG tablet Take 1 tablet (325 mg total) by mouth 2 (two) times daily with a meal. 06/06/18   [name] M, PA-C  fluticasone (FLONASE) 50 MCG/ACT nasal spray Place 1-2 sprays into both nostrils daily as needed for allergies or rhinitis.    [provider]  furosemide (LASIX) 40 MG tablet Take 1 tablet by mouth every Monday, Wednesday, and Friday. 03/10/19   [provider]  irbesartan (AVAPRO) 150 MG tablet Take 150 mg by mouth daily. 06/20/19   [provider]  magnesium oxide (MAG-OX) 400 MG tablet Take 1 tablet (400 mg total) by mouth 2 (two) times daily. 02/17/19   [name], MD  meclizine (ANTIVERT) 25 MG tablet Take 1 tablet (25 mg total) by mouth 3 (three) times daily as needed for dizziness. 07/06/04   Delora Fuel, MD    Melatonin 10 MG TABS Take 1 tablet by mouth at bedtime as needed (Sleep).    [provider]  metoprolol tartrate (LOPRESSOR) 25 MG tablet Take 1 tablet (25 mg total) by mouth 2 (two) times daily. 05/27/19   Camnitz, Ocie Doyne, MD  Multiple Vitamins-Minerals (PRESERVISION AREDS 2) CAPS Take 1 capsule by mouth 2 (two) times daily.    [provider]  oxyCODONE-acetaminophen (PERCOCET/ROXICET) 5-325 MG tablet Take 1 tablet by mouth every 6 (six) hours as needed for severe pain. 09/02/19   Long, Wonda Olds, MD  pantoprazole (PROTONIX) 40 MG tablet Take 40 mg by mouth daily. 07/22/19   [provider]  PARoxetine (PAXIL) 40 MG tablet Take 40 mg by mouth at bedtime. 05/18/19   [provider]  polycarbophil (FIBERCON) 625 MG tablet Take 1,250 mg by mouth at bedtime.  [provider]  Polyethyl Glycol-Propyl Glycol (SYSTANE OP) Place 2 drops into both eyes 2 (two) times daily as needed (dry eyes).     [provider]  Potassium Chloride ER 20 MEQ TBCR Take 1 tablet by mouth every Monday, Wednesday, and Friday. Take with furosemide. 02/17/19   [provider]  prochlorperazine (COMPAZINE) 10 MG tablet Take 1 tablet (10 mg total) by mouth every 6 (six) hours as needed. 08/31/19   Heilingoetter, Cassandra L, PA-C  rosuvastatin (CRESTOR) 5 MG tablet TAKE 1 TABLET BY MOUTH IN  THE MORNING Patient taking differently: Take 5 mg by mouth daily.  09/02/19   Evans Lance, MD  senna-docusate (SENOKOT-S) 8.6-50 MG tablet Take 1 tablet by mouth at bedtime as needed for mild constipation or moderate constipation. 09/02/19   Long, Wonda Olds, MD  tamsulosin (FLOMAX) 0.4 MG CAPS capsule Take 0.4 mg by mouth at bedtime.  12/04/16   [provider]    Allergies    Glucosamine-chondroitin, Tetanus toxoid, Fish oil, Ibuprofen, Naproxen, Glucosamine-chondroitin, Ibuprofen, and Naproxen  Review of Systems   Review of Systems  Constitutional: Negative for  chills and fever.  Eyes: Negative for visual disturbance.  Respiratory: Positive for shortness of breath.   Cardiovascular: Positive for palpitations. Negative for chest pain and leg swelling.  Gastrointestinal: Negative for abdominal pain and vomiting.  Neurological: Negative for dizziness and syncope.  All other systems reviewed and are negative.   Physical Exam Updated Vital Signs BP 128/87   Pulse (!) 103   Resp (!) 29   Ht 5' 9"  (1.753 m)   Wt 97.6 kg   SpO2 96%   BMI 31.76 kg/m   Physical Exam Vitals and nursing note reviewed.  Constitutional:      General: He is not in acute distress.    Appearance: He is not toxic-appearing.     Comments: Chronically ill appearing.   HENT:     Head: Normocephalic and atraumatic.  Eyes:     General:        Right eye: No discharge.        Left eye: No discharge.     Conjunctiva/sclera: Conjunctivae normal.  Cardiovascular:     Rate and Rhythm: Regular rhythm. Tachycardia present.  Pulmonary:     Effort: Tachypnea present.     Breath sounds: No wheezing, rhonchi or rales.     Comments: SpO2 88%-92% on RA, improved to 56% with application of 2L via [place]. Poor air movement. No obvious adventitious breath sounds.  Abdominal:     General: There is no distension.     Palpations: Abdomen is soft.     Tenderness: There is no abdominal tenderness.  Musculoskeletal:     Cervical back: Neck supple.     Right lower leg: No tenderness. No edema.     Left lower leg: No tenderness. No edema.  Skin:    General: Skin is warm and dry.     Findings: No rash.  Neurological:     Mental Status: He is alert.     Comments: Clear speech.   Psychiatric:        Behavior: Behavior normal.     ED Results / Procedures / Treatments   Labs (all labs ordered are listed, but only abnormal results are displayed) Labs Reviewed  COMPREHENSIVE METABOLIC PANEL - Abnormal; Notable for the following components:      Result Value   Glucose, Bld 127 (*)     Calcium 8.6 (*)  Total Protein 5.9 (*)    Albumin 3.0 (*)    AST 58 (*)    ALT 66 (*)    Alkaline Phosphatase 219 (*)    All other components within normal limits  CBC WITH DIFFERENTIAL/PLATELET - Abnormal; Notable for the following components:   WBC 26.1 (*)    RBC 3.59 (*)    Hemoglobin 11.4 (*)    HCT 35.1 (*)    Platelets 110 (*)    Neutro Abs 18.0 (*)    Monocytes Absolute 2.7 (*)    Abs Immature Granulocytes 3.90 (*)    All other components within normal limits  BRAIN NATRIURETIC PEPTIDE - Abnormal; Notable for the following components:   B Natriuretic Peptide 586.1 (*)    All other components within normal limits  SARS CORONAVIRUS 2 BY RT PCR (HOSPITAL ORDER, [facility])  URINALYSIS, ROUTINE W REFLEX MICROSCOPIC  TROPONIN I (HIGH SENSITIVITY)  TROPONIN I (HIGH SENSITIVITY)    EKG None  Radiology CT Angio Chest PE W/Cm &/Or Wo Cm  Result Date: 09/17/2019 CLINICAL DATA:  Hypoxemia EXAM: CT ANGIOGRAPHY CHEST WITH CONTRAST TECHNIQUE: Multidetector CT imaging of the chest was performed using the standard protocol during bolus administration of intravenous contrast. Multiplanar CT image reconstructions and MIPs were obtained to evaluate the vascular anatomy. CONTRAST:  34m OMNIPAQUE IOHEXOL 350 MG/ML SOLN COMPARISON:  07/21/2019 PET-CT FINDINGS: Cardiovascular: Satisfactory opacification of the pulmonary arteries to the segmental level. No evidence of pulmonary embolism. Normal heart size. No pericardial effusion. Multifocal coronary atherosclerosis. Mediastinum/Nodes: Right hilar and subcarinal adenopathy that is improved from prior. A subcarinal node measures 14 mm in short axis compared to 24 mm previously. Lungs/Pleura: Tracheobronchomalacia. Postoperative right lower lobe with sutures along the posterior major fissure. Emphysema. There is no edema, consolidation, effusion, or pneumothorax. Upper Abdomen: Liver lesions described on comparison PET CT  are not seen on this study which is essentially a noncontrast phase of the liver. Musculoskeletal: No acute or aggressive finding. Pseudoarticulation between the upper right first 2 ribs. T2 and T3 superior endplate sclerosis and mild height loss which is new from 07/06/2019. Review of the MIP images confirms the above findings. IMPRESSION: 1. Negative for pulmonary embolism or other acute finding. 2. History of lung cancer with improved right hilar and mediastinal adenopathy. 3. Aortic Atherosclerosis (ICD10-I70.0) and Emphysema (ICD10-J43.9). 4. Interval but nonacute superior endplate fractures at T2 and T3 when compared to July 06, 2019 CT. Electronically Signed   By: JMonte FantasiaM.D.   On: 09/17/2019 06:10   DG Chest Portable 1 View  Result Date: 09/17/2019 CLINICAL DATA:  Dyspnea EXAM: PORTABLE CHEST 1 VIEW COMPARISON:  04/13/2019 FINDINGS: Cardiac shadow is enlarged. Aortic calcifications are again noted. Chronic fibrotic changes are seen bilaterally. No focal confluent infiltrate is seen. Bilateral shoulder replacements are noted. IMPRESSION: Chronic scarring without acute abnormality. Electronically Signed   By: MInez CatalinaM.D.   On: 09/17/2019 02:31    Procedures Procedures (including critical care time)  Medications Ordered in ED Medications  LORazepam (ATIVAN) injection 0.5 mg (has no administration in time range)  furosemide (LASIX) injection 20 mg (has no administration in time range)  ipratropium-albuterol (DUONEB) 0.5-2.5 (3) MG/3ML nebulizer solution 3 mL (3 mLs Nebulization Given 09/17/19 0425)  LORazepam (ATIVAN) injection 0.5 mg (0.5 mg Intravenous Given 09/17/19 0425)  iohexol (OMNIPAQUE) 350 MG/ML injection 75 mL (75 mLs Intravenous Contrast Given 09/17/19 0546)    ED Course  I have reviewed the triage  vital signs and the nursing notes.  Pertinent labs & imaging results that were available during my care of the patient were reviewed by me and considered in my medical  decision making (see chart for details).    George Vega was evaluated in Emergency Department on 09/17/2019 for the symptoms described in the history of present illness. He/she was evaluated in the context of the global COVID-19 pandemic, which necessitated consideration that the patient might be at risk for infection with the SARS-CoV-2 virus that causes COVID-19. Institutional protocols and algorithms that pertain to the evaluation of patients at risk for COVID-19 are in a state of rapid change based on information released by regulatory bodies including the CDC and federal and state organizations. These policies and algorithms were followed during the patient's care in the ED.  MDM Rules/Calculators/A&P                         Patient presents to the ED with complaints of intermittent palpitations tonight.  He is chronically ill appearing, but non-toxic, vitals with significant intermittent tachypnea and new O2 requirement. Lungs without obvious adventitious sounds, but very poor air movement.   DDx: Arrhythmia, critical anemia, electrolyte derangement, CHF exacerbation, hypoxia related to his chronic condition w/ possible worsening of disease, pulmonary embolism, atypical ACS, pneumothorax, pneumonia.  Additional history obtained:  Additional history obtained from patient's wife @ bedside. Previous records obtained and reviewed.   Lab Tests:  I Ordered, reviewed, and interpreted labs, which included:  CBC: Leukocytosis at 26.1 progressing from 2 days prior, fairly stable anemia, mildly improved thrombocytopenia. CMP: Mildly elevated LFTs and alk phos similar to prior ranges.  Mild hypocalcemia.  No significant electrolyte derangement. Troponin: 17 BNP: Elevated COVID testing: Negative  Imaging Studies ordered:  I ordered imaging studies which included CXR, I independently visualized and interpreted imaging which showed chronic scarring without acute abnormality.  ED Course:  We will  trial DuoNeb given his poor air movement and give Ativan as patient is feeling somewhat anxious.  Plan for CT angio of the chest for further assessment. Plan for admission for hypoxic respiratory failure with new O2 requirement-discussed with patient and his wife at bedside who are in agreement.  CTA: 1. Negative for pulmonary embolism or other acute finding. 2. History of lung cancer with improved right hilar and mediastinal adenopathy. 3. Aortic Atherosclerosis (ICD10-I70.0) and Emphysema (ICD10-J43.9). 4. Interval but nonacute superior endplate fractures at T2 and T3 when compared to July 06, 2019 CT  06:30: CONSULT: Discussed case with hospitalist Dr. Hal Hope, accepts admission, requesting patient be given 20 mg of IV Lasix which has been ordered.   Findings and plan of care discussed with supervising physician Dr. Dina Rich who has evaluated the patient & is in agreement.   Portions of this note were generated with [employer]obby[employer]. Dictation errors may occur despite best attempts at proofreading.   Final Clinical Impression(s) / ED Diagnoses Final diagnoses:  Dyspnea, unspecified type  Palpitations  Respiratory failure with hypoxia, unspecified chronicity [facility])    Rx / DC Orders ED Discharge Orders    None       Amaryllis Dyke, PA-C 09/17/19 [id number]    Merryl Hacker, MD 09/17/19 [phone]

## 2019-09-17 NOTE — ED Notes (Signed)
[facility] is here loading pt & his belongings for transport to [facility].

## 2019-09-17 NOTE — Evaluation (Signed)
Physical Therapy Evaluation Patient Details Name: George Vega MRN: [id number] DOB: May 16, 1940 Today's Date: 09/17/2019   History of Present Illness  George Vega is a 79 y.o. male with medical history significant of CVA; stage 1 RLL SCC; HTN; CAD; afib; and CHF presenting with SOB.  He has been having trouble.  He started chemo 2 1/2 weeks ago.  Since then, he has been having problems with his breathing, more pronounced.  It was intermittent.  A few days ago, he started with panting, wheezing.  Difficulty sleeping and staying awake.  It got worse last night and they had to come in.  +audible wheezing.  + cough.  He does not wear home O2 but they think he needs it  Clinical Impression  Pt admitted with above diagnosis. Limited eval due to pt impulsive and confused as well as difficult to come to EDge of stretcher and felt it unsafe to stand.  Pt desaturates to 87% on RA therefore replaced O2 at 3.5L.  Pt sats 94% on 3.5L.  Pt will need 24 hour care and if he doesn't have that, may need SNF.  Will follow acutely. Pt currently with functional limitations due to the deficits listed below (see PT Problem List). Pt will benefit from skilled PT to increase their independence and safety with mobility to allow discharge to the venue listed below.      Follow Up Recommendations Home health PT;Supervision/Assistance - 24 hour (if wife cant provide assist, will need SNF)    Equipment Recommendations  Rolling walker with 5" wheels;3in1 (PT)    Recommendations for Other Services       Precautions / Restrictions Precautions Precautions: Fall      Mobility  Bed Mobility Overal bed mobility: Needs Assistance Bed Mobility: Supine to Sit     Supine to sit: Min assist;HOB elevated     General bed mobility comments: Difficult due to pt on stretcher.  Pt had difficult time sitting up due to this.    Transfers                 General transfer comment: Pt shaky and desaturation with  sitting therefore laid pt back down.   Ambulation/Gait                Stairs            Wheelchair Mobility    Modified Rankin (Stroke Patients Only)       Balance Overall balance assessment: Needs assistance Sitting-balance support: Bilateral upper extremity supported;Feet supported Sitting balance-Leahy Scale: Poor Sitting balance - Comments: needs UE support to sit EOB                                     Pertinent Vitals/Pain Pain Assessment: No/denies pain    Home Living Family/patient expects to be discharged to:: Private residence Living Arrangements: Spouse/significant other Available Help at Discharge: Family;Available 24 hours/day Type of Home: House Home Access: Stairs to enter Entrance Stairs-Rails: Right Entrance Stairs-Number of Steps: 2 Home Layout: Able to live on main level with bedroom/bathroom Home Equipment: Cane - single point;Bedside commode;Shower seat Additional Comments: Pt questionable historian as he is confused.      Prior Function Level of Independence: Independent         Comments: Pt has been receiving chemo and it sounds like he has been declining.      Hand Dominance  Dominant Hand: Right    Extremity/Trunk Assessment   Upper Extremity Assessment Upper Extremity Assessment: Defer to OT evaluation    Lower Extremity Assessment Lower Extremity Assessment: Generalized weakness    Cervical / Trunk Assessment Cervical / Trunk Assessment: Normal  Communication   Communication: No difficulties  Cognition Arousal/Alertness: Awake/alert Behavior During Therapy: Flat affect;Impulsive Overall Cognitive Status: Impaired/Different from baseline Area of Impairment: Orientation;Following commands;Safety/judgement;Awareness;Problem solving                 Orientation Level: Disoriented to;Place;Time;Situation (States "garage" and then "[place]")     Following Commands: Follows one step  commands inconsistently;Follows one step commands with increased time Safety/Judgement: Decreased awareness of safety;Decreased awareness of deficits   Problem Solving: Slow processing;Difficulty sequencing;Requires verbal cues;Requires tactile cues General Comments: Pt very confused.       General Comments General comments (skin integrity, edema, etc.): 95 bpm, 94%3.5L, 130/80.  desat to 87% on RA. replaced O2 at 3.5LO2 as it was on arrival.     Exercises     Assessment/Plan    PT Assessment Patient needs continued PT services  PT Problem List Decreased activity tolerance;Decreased balance;Decreased mobility;Decreased knowledge of use of DME;Decreased safety awareness;Decreased coordination;Cardiopulmonary status limiting activity       PT Treatment Interventions DME instruction;Gait training;Functional mobility training;Therapeutic activities;Therapeutic exercise;Balance training;Patient/family education;Stair training    PT Goals (Current goals can be found in the Care Plan section)  Acute Rehab PT Goals Patient Stated Goal: unable to state PT Goal Formulation: With patient Time For Goal Achievement: 10/01/19 Potential to Achieve Goals: Good    Frequency Min 3X/week   Barriers to discharge        Co-evaluation               AM-PAC PT "6 Clicks" Mobility  Outcome Measure Help needed turning from your back to your side while in a flat bed without using bedrails?: A Little Help needed moving from lying on your back to sitting on the side of a flat bed without using bedrails?: A Little Help needed moving to and from a bed to a chair (including a wheelchair)?: A Little Help needed standing up from a chair using your arms (e.g., wheelchair or bedside chair)?: A Little Help needed to walk in hospital room?: A Lot Help needed climbing 3-5 steps with a railing? : A Lot 6 Click Score: 16    End of Session Equipment Utilized During Treatment: Oxygen Activity Tolerance:  Patient limited by fatigue Patient left: with call bell/phone within reach;in bed Nurse Communication: Mobility status PT Visit Diagnosis: Muscle weakness (generalized) (M62.81);Unsteadiness on feet (R26.81)    Time: 4166-0630 PT Time Calculation (min) (ACUTE ONLY): 19 min   Charges:   PT Evaluation $PT Eval Moderate Complexity: 1 Mod          Detron Carras W,PT Acute Rehabilitation Services Pager:  [phone]  Office:  [place] 09/17/2019, 12:00 PM

## 2019-09-18 ENCOUNTER — Other Ambulatory Visit: Payer: Self-pay

## 2019-09-18 ENCOUNTER — Telehealth: Payer: Self-pay | Admitting: Medical Oncology

## 2019-09-18 DIAGNOSIS — T458X5A Adverse effect of other primarily systemic and hematological agents, initial encounter: Secondary | ICD-10-CM | POA: Diagnosis present

## 2019-09-18 DIAGNOSIS — E785 Hyperlipidemia, unspecified: Secondary | ICD-10-CM | POA: Diagnosis present

## 2019-09-18 DIAGNOSIS — Z96611 Presence of right artificial shoulder joint: Secondary | ICD-10-CM | POA: Diagnosis present

## 2019-09-18 DIAGNOSIS — I340 Nonrheumatic mitral (valve) insufficiency: Secondary | ICD-10-CM | POA: Diagnosis not present

## 2019-09-18 DIAGNOSIS — Z9221 Personal history of antineoplastic chemotherapy: Secondary | ICD-10-CM | POA: Diagnosis not present

## 2019-09-18 DIAGNOSIS — J9621 Acute and chronic respiratory failure with hypoxia: Secondary | ICD-10-CM | POA: Diagnosis present

## 2019-09-18 DIAGNOSIS — Z809 Family history of malignant neoplasm, unspecified: Secondary | ICD-10-CM | POA: Diagnosis not present

## 2019-09-18 DIAGNOSIS — I2510 Atherosclerotic heart disease of native coronary artery without angina pectoris: Secondary | ICD-10-CM | POA: Diagnosis present

## 2019-09-18 DIAGNOSIS — J441 Chronic obstructive pulmonary disease with (acute) exacerbation: Secondary | ICD-10-CM | POA: Diagnosis present

## 2019-09-18 DIAGNOSIS — Z66 Do not resuscitate: Secondary | ICD-10-CM | POA: Diagnosis not present

## 2019-09-18 DIAGNOSIS — C3411 Malignant neoplasm of upper lobe, right bronchus or lung: Secondary | ICD-10-CM | POA: Diagnosis not present

## 2019-09-18 DIAGNOSIS — Z6830 Body mass index (BMI) 30.0-30.9, adult: Secondary | ICD-10-CM | POA: Diagnosis not present

## 2019-09-18 DIAGNOSIS — F419 Anxiety disorder, unspecified: Secondary | ICD-10-CM | POA: Diagnosis present

## 2019-09-18 DIAGNOSIS — Z8249 Family history of ischemic heart disease and other diseases of the circulatory system: Secondary | ICD-10-CM | POA: Diagnosis not present

## 2019-09-18 DIAGNOSIS — E669 Obesity, unspecified: Secondary | ICD-10-CM | POA: Diagnosis present

## 2019-09-18 DIAGNOSIS — I110 Hypertensive heart disease with heart failure: Secondary | ICD-10-CM | POA: Diagnosis present

## 2019-09-18 DIAGNOSIS — I4819 Other persistent atrial fibrillation: Secondary | ICD-10-CM | POA: Diagnosis present

## 2019-09-18 DIAGNOSIS — J9601 Acute respiratory failure with hypoxia: Secondary | ICD-10-CM | POA: Diagnosis not present

## 2019-09-18 DIAGNOSIS — Z87891 Personal history of nicotine dependence: Secondary | ICD-10-CM | POA: Diagnosis not present

## 2019-09-18 DIAGNOSIS — C7800 Secondary malignant neoplasm of unspecified lung: Secondary | ICD-10-CM | POA: Diagnosis present

## 2019-09-18 DIAGNOSIS — I361 Nonrheumatic tricuspid (valve) insufficiency: Secondary | ICD-10-CM | POA: Diagnosis not present

## 2019-09-18 DIAGNOSIS — Z20822 Contact with and (suspected) exposure to covid-19: Secondary | ICD-10-CM | POA: Diagnosis present

## 2019-09-18 DIAGNOSIS — Z8673 Personal history of transient ischemic attack (TIA), and cerebral infarction without residual deficits: Secondary | ICD-10-CM | POA: Diagnosis not present

## 2019-09-18 DIAGNOSIS — D72829 Elevated white blood cell count, unspecified: Secondary | ICD-10-CM | POA: Diagnosis present

## 2019-09-18 DIAGNOSIS — R296 Repeated falls: Secondary | ICD-10-CM | POA: Diagnosis present

## 2019-09-18 DIAGNOSIS — C787 Secondary malignant neoplasm of liver and intrahepatic bile duct: Secondary | ICD-10-CM | POA: Diagnosis present

## 2019-09-18 DIAGNOSIS — Z96651 Presence of right artificial knee joint: Secondary | ICD-10-CM | POA: Diagnosis present

## 2019-09-18 DIAGNOSIS — R0600 Dyspnea, unspecified: Secondary | ICD-10-CM | POA: Diagnosis not present

## 2019-09-18 DIAGNOSIS — J9691 Respiratory failure, unspecified with hypoxia: Secondary | ICD-10-CM | POA: Diagnosis not present

## 2019-09-18 DIAGNOSIS — R002 Palpitations: Secondary | ICD-10-CM | POA: Diagnosis present

## 2019-09-18 DIAGNOSIS — I5033 Acute on chronic diastolic (congestive) heart failure: Secondary | ICD-10-CM | POA: Diagnosis present

## 2019-09-18 LAB — BASIC METABOLIC PANEL
Anion gap: 12 (ref 5–15)
BUN: 19 mg/dL (ref 8–23)
CO2: 24 mmol/L (ref 22–32)
Calcium: 8.8 mg/dL — ABNORMAL LOW (ref 8.9–10.3)
Chloride: 99 mmol/L (ref 98–111)
Creatinine, Ser: 1.15 mg/dL (ref 0.61–1.24)
GFR calc Af Amer: >60 mL/min (ref >60)
GFR calc non Af Amer: >60 mL/min (ref >60)
Glucose, Bld: 194 mg/dL — ABNORMAL HIGH (ref 70–99)
Potassium: 4.4 mmol/L (ref 3.5–5.1)
Sodium: 135 mmol/L (ref 135–145)

## 2019-09-18 LAB — CBC
HCT: 36 % — ABNORMAL LOW (ref 39.0–52.0)
Hemoglobin: 11.5 g/dL — ABNORMAL LOW (ref 13.0–17.0)
MCH: 31.9 pg (ref 26.0–34.0)
MCHC: 31.9 g/dL (ref 30.0–36.0)
MCV: 100 fL (ref 80.0–100.0)
Platelets: 186 10*3/uL (ref 150–400)
RBC: 3.6 MIL/uL — ABNORMAL LOW (ref 4.22–5.81)
RDW: 13.8 % (ref 11.5–15.5)
WBC: 32.3 10*3/uL — ABNORMAL HIGH (ref 4.0–10.5)
nRBC: 0 % (ref 0.0–0.2)

## 2019-09-18 MED ORDER — ENSURE ENLIVE PO LIQD: 237.0000 mL | ORAL | Status: DC

## 2019-09-18 NOTE — Progress Notes (Signed)
PROGRESS NOTE    George Vega  ERD:[id number] DOB: 1940/09/16 DOA: 09/17/2019 PCP: [name], MD   Chef Complaints: Shortness of breath  Brief Narrative: As per Dr. Lorin Mercy" 79 y.o. male with medical history significant of CVA; stage 1 RLL SCC; HTN; CAD; afib; and CHF presenting with SOB.  He has been having trouble. He started chemo 2 1/2 weeks ago. Since then, he has been having problems with his breathing, more pronounced. It was intermittent. A few days ago, he started with panting, wheezing. Difficulty sleeping and staying awake. It got worse last night and they had to come in. +audible wheezing. + cough. He does not wear home O2 but they think he needs it. His pulse ox has been in the 90s at home. +LE edema, L >R, intermittently. No obvious orthopnea. +PND. Panting with minimal exertion. No fevers. Mild rhinorrhea x 2 days." ED CT negative for PE BNP elevated more than 500 patient hypoxic in the 80s given IV Lasix, also had leukocytosis could be from recent Neulasta, patient was admitted for further management.  Subjective: This morning. Chest is clearing now feeling better, no CP/N/Vomiting.SOB improving report using walker for few wks and dizzy on standing On 4l [place] report throat loosening up after nebs this am Lives with wife at home   Assessment & Plan:  Acute on chronic hypoxic respiratory failure: Presumed to be from COPD exacerbation but no clear diagnosis of COPD, noted to have emphysema on the imaging.  CT negative for PE or pneumonia, mildly elevated BNP but no edema on chest x-ray.  Patient did receive a dose of Lasix.  Patient does have lung cancer improved right hilar and mediastinal adenopathy on recent CT. I suspect this is in the setting of malignancy, deconditioning as well.  Will obtain echocardiogram to evaluate for EF (BNP IN 500).  Continue on oral doxycycline, IV steroids, bronchodilator antianxiety medication.  Continue PT OT and continue to wean  down oxygen as tolerated.  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  Patient reports she has PT/OT signs of surviving the malignancy, followed by Dr. Julien Nordmann.  May need palliative care evaluation.  Remains full code for now.  Leukocytosis likely from Neulasta, steroid.  On doxycycline.  Monitor. Recent Labs  Lab 09/15/19 1215 09/17/19 0111 09/18/19 0946  WBC 18.9* 26.1* 32.3*   Hyperlipemia: Continue statin.  HTN: BP controlled on Avapro Lopressor.  Paroxysmal atrial fibrillation: Rate controlled continue Eliquis amiodarone and Lopressor.   Obesity (BMI 30.0-34.9): Will benefit with weight loss and healthy lifestyle.  Patient complains of dizziness, check orthostatic vitals.  DVT prophylaxis: eLIQUIS Code Status: full Family Communication: plan of care discussed with patient at bedside.  Status is: Admitted as observation Patient remains hospitalized for ongoing management of respiratory failure remains on oxygen, continue PT OT.  Dispo: The patient is from: Home              Anticipated d/c is to: Home encourage PT OT.              Anticipated d/c date is: 2 days              Patient currently is not medically stable to d/c.  Nutrition: Diet Order  Diet Heart Room service appropriate? Yes; Fluid consistency: Thin  Diet effective now                 Body mass index is 31.76 kg/m.  Consultants:see note  Procedures:see note Microbiology:see note  Medications: Scheduled Meds: . amiodarone  200 mg Oral Daily  . apixaban  5 mg Oral BID  . baclofen  10 mg Oral BID  . doxycycline  100 mg Oral Q12H  . ferrous sulfate  325 mg Oral BID WC  . ipratropium-albuterol  3 mL Nebulization TID  . irbesartan  150 mg Oral Daily    . magnesium oxide  400 mg Oral BID  . metoprolol tartrate  25 mg Oral BID  . multivitamin  1 tablet Oral Daily  . pantoprazole  40 mg Oral Daily  . PARoxetine  40 mg Oral QHS  . polycarbophil  1,250 mg Oral QHS  . predniSONE  40 mg Oral Q breakfast  . rosuvastatin  5 mg Oral Daily  . tamsulosin  0.4 mg Oral QHS   Continuous Infusions: . lactated ringers 75 mL/hr at 09/18/19 0200    Antimicrobials: Anti-infectives (From admission, onward)   Start     Dose/Rate Route Frequency Ordered Stop   09/17/19 1000  doxycycline (VIBRA-TABS) tablet 100 mg     Discontinue     100 mg Oral Every 12 hours 09/17/19 0814 09/22/19 0959       Objective: Vitals: Today's Vitals   09/18/19 0207 09/18/19 0548 09/18/19 0756 09/18/19 0827  BP: 122/76 113/79    Pulse: 83 75    Resp: (!) 21 20    Temp: 98 F (36.7 C) (!) 97.5 F (36.4 C)    TempSrc:      SpO2: 99% 99% 96%   Weight:      Height:      PainSc: 0-No pain   0-No pain    Intake/Output Summary (Last 24 hours) at 09/18/2019 0857 Last data filed at 09/18/2019 0828 Gross per 24 hour  Intake 1285 ml  Output 625 ml  Net 660 ml   Filed Weights   09/17/19 0102  Weight: 97.6 kg   Weight change:    Intake/Output from previous day: 07/08 0701 - 07/09 0700 In: 1285 [I.V.:1285] Out: 325 [Urine:325] Intake/Output this shift: Total I/O In: -  Out: 300 [Urine:300]  Examination:  General exam: AAOx3, on [place],  ,NAD, weak appearing. HEENT:Oral mucosa moist, Ear/Nose WNL grossly,dentition normal. Respiratory system: bilaterally diminished at base, clear otherwise,no wheezing or crackles,no use of accessory muscle, non tender. Cardiovascular system: S1 & S2 +, regular, No JVD. Gastrointestinal system: Abdomen soft, NT,ND, BS+. Nervous System:Alert, awake, moving extremities and grossly nonfocal Extremities: No edema, distal peripheral pulses palpable.  Skin: No rashes,no icterus. MSK: Normal muscle bulk,tone, power  Data Reviewed: I  have personally reviewed following labs and imaging studies CBC: Recent Labs  Lab 09/15/19 1215 09/17/19 0111  WBC 18.9* 26.1*  NEUTROABS 12.2* 18.0*  HGB 11.4* 11.4*  HCT 35.0* 35.1*  MCV 96.2 97.8  PLT 64* 629*   Basic Metabolic Panel: Recent Labs  Lab 09/15/19 1215 09/17/19 0111  NA 137 136  K 4.1 4.1  CL 102 102  CO2 25 23  GLUCOSE 137* 127*  BUN 10 8  CREATININE 0.95 0.90  CALCIUM 8.9 8.6*   GFR: Estimated Creatinine Clearance: 78 mL/min (by C-G formula based on SCr of 0.9 mg/dL). Liver Function Tests: Recent Labs  Lab 09/15/19 1215 09/17/19 0111  AST 35 58*  ALT 52* 66*  ALKPHOS 210* 219*  BILITOT 0.5 0.8  PROT 6.2* 5.9*  ALBUMIN 3.2* 3.0*   No results for input(s): LIPASE, AMYLASE in the last 168 hours. No results for input(s): AMMONIA in the last 168 hours. Coagulation Profile: No results for input(s): INR, PROTIME in the last 168 hours. Cardiac Enzymes: No results for input(s): CKTOTAL, CKMB, CKMBINDEX, TROPONINI in the last 168 hours. BNP (last 3 results) No results for input(s): PROBNP in the last 8760 hours. HbA1C: No results for input(s): HGBA1C in the last 72 hours. CBG: No results for input(s): GLUCAP in the last 168 hours. Lipid Profile: No results for input(s): CHOL, HDL, LDLCALC, TRIG, CHOLHDL, LDLDIRECT in the last 72 hours. Thyroid Function Tests: No results for input(s): TSH, T4TOTAL, FREET4, T3FREE, THYROIDAB in the last 72 hours. Anemia Panel: No results for input(s): VITAMINB12, FOLATE, FERRITIN, TIBC, IRON, RETICCTPCT in the last 72 hours. Sepsis Labs: No results for input(s): PROCALCITON, LATICACIDVEN in the last 168 hours.  Recent Results (from the past 240 hour(s))  SARS Coronavirus 2 by RT PCR (hospital order, performed in [facility] hospital lab) Nasopharyngeal Nasopharyngeal Swab     Status: None   Collection Time: 09/17/19  2:48 AM   Specimen: Nasopharyngeal Swab  Result Value Ref Range Status   SARS Coronavirus 2  NEGATIVE NEGATIVE Final    Comment: (NOTE) SARS-CoV-2 target nucleic acids are NOT DETECTED.  The SARS-CoV-2 RNA is generally detectable in upper and lower respiratory specimens during the acute phase of infection. The lowest concentration of SARS-CoV-2 viral copies this assay can detect is 250 copies / mL. A negative result does not preclude SARS-CoV-2 infection and should not be used as the sole basis for treatment or other patient management decisions.  A negative result may occur with improper specimen collection / handling, submission of specimen other than nasopharyngeal swab, presence of viral mutation(s) within the areas targeted by this assay, and inadequate number of viral copies (<250 copies / mL). A negative result must be combined with clinical observations, patient history, and epidemiological information.  Fact Sheet for Patients:   StrictlyIdeas.no  Fact Sheet for Healthcare Providers: BankingDealers.co.za  This test is not yet approved or  cleared by the [place] FDA and has been authorized for detection and/or diagnosis of SARS-CoV-2 by FDA under an Emergency Use Authorization (EUA).  This EUA will remain in effect (meaning this test can be used) for the duration of the COVID-19 declaration under Section 564(b)(1) of the Act, 21 U.S.C. section 360bbb-3(b)(1), unless the authorization is terminated or revoked sooner.  Performed at [facility], [place] [address], [place], [place] [address]       Radiology Studies: CT Angio Chest PE W/Cm &/Or Wo Cm  Result Date: 09/17/2019 CLINICAL DATA:  Hypoxemia EXAM: CT ANGIOGRAPHY CHEST WITH CONTRAST TECHNIQUE: Multidetector CT imaging of the chest was performed using the standard protocol during bolus administration of intravenous contrast. Multiplanar CT image reconstructions and MIPs were obtained to evaluate the vascular anatomy. CONTRAST:  43mL OMNIPAQUE IOHEXOL 350  MG/ML SOLN COMPARISON:  07/21/2019 PET-CT FINDINGS: Cardiovascular: Satisfactory opacification of the pulmonary arteries to the segmental level. No evidence of pulmonary embolism. Normal heart size. No pericardial effusion. Multifocal coronary atherosclerosis. Mediastinum/Nodes: Right hilar and subcarinal adenopathy that is improved from prior. A subcarinal node measures 14 mm in short axis compared to 24 mm previously. Lungs/Pleura: Tracheobronchomalacia. Postoperative right lower lobe with sutures along the posterior major fissure. Emphysema. There  is no edema, consolidation, effusion, or pneumothorax. Upper Abdomen: Liver lesions described on comparison PET CT are not seen on this study which is essentially a noncontrast phase of the liver. Musculoskeletal: No acute or aggressive finding. Pseudoarticulation between the upper right first 2 ribs. T2 and T3 superior endplate sclerosis and mild height loss which is new from 07/06/2019. Review of the MIP images confirms the above findings. IMPRESSION: 1. Negative for pulmonary embolism or other acute finding. 2. History of lung cancer with improved right hilar and mediastinal adenopathy. 3. Aortic Atherosclerosis (ICD10-I70.0) and Emphysema (ICD10-J43.9). 4. Interval but nonacute superior endplate fractures at T2 and T3 when compared to July 06, 2019 CT. Electronically Signed   By: Monte Fantasia M.D.   On: 09/17/2019 06:10   DG Chest Portable 1 View  Result Date: 09/17/2019 CLINICAL DATA:  Dyspnea EXAM: PORTABLE CHEST 1 VIEW COMPARISON:  04/13/2019 FINDINGS: Cardiac shadow is enlarged. Aortic calcifications are again noted. Chronic fibrotic changes are seen bilaterally. No focal confluent infiltrate is seen. Bilateral shoulder replacements are noted. IMPRESSION: Chronic scarring without acute abnormality. Electronically Signed   By: Inez Catalina M.D.   On: 09/17/2019 02:31     LOS: 0 days   Antonieta Pert, MD [facility]  09/18/2019, 8:57 AM

## 2019-09-18 NOTE — Progress Notes (Signed)
Initial Nutrition Assessment  DOCUMENTATION CODES:   Obesity unspecified  INTERVENTION:   -Ensure Enlive po daily, each supplement provides 350 kcal and 20 grams of protein  NUTRITION DIAGNOSIS:   Increased nutrient needs related to cancer and cancer related treatments as evidenced by estimated needs.  GOAL:   Patient will meet greater than or equal to 90% of their needs  MONITOR:   PO intake, Supplement acceptance, Labs, Weight trends, I & O's  REASON FOR ASSESSMENT:   Consult COPD Protocol  ASSESSMENT:   79 y.o. male with medical history significant of CVA; stage 1 RLL SCC; HTN; CAD; afib; and CHF presenting with SOB.  He has been having trouble.  He started chemo 2 1/2 weeks ago.  Patient currently undergoing chemotherapy for lung cancer (first diagnosed in June 2021). Pt with increase SOB PTA. No reports of poor appetite. Pt was started on a diet yesterday but no PO documented at this time. Given increased needs from cancer treatments, will order Ensure supplements.  Per weight records, weights have remained stable.  Labs reviewed.   Medications: Ferrous sulfate, MAG-OX, Prednisone, Prosight MVI, Fibercon, Lactated ringers  NUTRITION - FOCUSED PHYSICAL EXAM:  Unable to complete  Diet Order:   Diet Order            Diet Heart Room service appropriate? Yes; Fluid consistency: Thin  Diet effective now                 EDUCATION NEEDS:   No education needs have been identified at this time  Skin:  Skin Assessment: Reviewed RN Assessment  Last BM:  PTA  Height:   Ht Readings from Last 1 Encounters:  09/17/19 5\' 9"  (1.753 m)    Weight:   Wt Readings from Last 1 Encounters:  09/17/19 97.6 kg   BMI:  Body mass index is 31.76 kg/m.  Estimated Nutritional Needs:   Kcal:  2200-2400  Protein:  100-110g  Fluid:  2L/day  Clayton Bibles, MS, RD, LDN Inpatient Clinical Dietitian Contact information available via Amion

## 2019-09-18 NOTE — Telephone Encounter (Signed)
Pt admitted for respiratory difficulty. I told wife to keep George Vega's  appt Monday to discuss plan of care and that he may or may not need labs before the appt.

## 2019-09-18 NOTE — Progress Notes (Signed)
Physical Therapy Treatment Patient Details Name: George Vega MRN: [id number] DOB: 01/22/1941 Today's Date: 09/18/2019    History of Present Illness George Vega is a 79 y.o. male with medical history significant of CVA; stage 1 RLL SCC; HTN; CAD; afib; and CHF presenting with SOB.  He has been having trouble.  He started chemo 2 1/2 weeks ago.  Since then, he has been having problems with his breathing, more pronounced.  It was intermittent.  A few days ago, he started with panting, wheezing.  Difficulty sleeping and staying awake.  It got worse last night and they had to come in.  +audible wheezing.  + cough.  He does not wear home O2 but they think he needs it.  Admitted with resp failure.    PT Comments    Pt making good progress today.  Did need min cues for safety, pursed lip breathing, and rest breaks.  Pt was able to ambulate 150' with RW and min guard for safety.  O2 sats decrease without use of 2 LPM O2 (see below). Pt with mild unsteadienss with RW.  Cont POC.     Follow Up Recommendations  Home health PT;Supervision/Assistance - 24 hour     Equipment Recommendations  3in1 (PT)    Recommendations for Other Services       Precautions / Restrictions Precautions Precautions: Fall    Mobility  Bed Mobility Overal bed mobility: Needs Assistance Bed Mobility: Supine to Sit     Supine to sit: HOB elevated;Supervision        Transfers Overall transfer level: Needs assistance Equipment used: Rolling walker (2 wheeled) Transfers: Sit to/from Stand Sit to Stand: Min guard         General transfer comment: cues for safe hand placement  Ambulation/Gait Ambulation/Gait assistance: Min guard Gait Distance (Feet): 200 Feet Assistive device: Rolling walker (2 wheeled) Gait Pattern/deviations: Decreased stride length;Trunk flexed Gait velocity: decreased   General Gait Details: cued for pursed lip breathing and rest breaks; required 1 standing rest break; DOE  of 3/4 with activity   Marine scientist Rankin (Stroke Patients Only)       Balance Overall balance assessment: Needs assistance Sitting-balance support: No upper extremity supported;Feet supported Sitting balance-Leahy Scale: Good     Standing balance support: Bilateral upper extremity supported;During functional activity Standing balance-Leahy Scale: Poor Standing balance comment: required use of RW                            Cognition Arousal/Alertness: Awake/alert Behavior During Therapy: WFL for tasks assessed/performed Overall Cognitive Status: Within Functional Limits for tasks assessed                                        Exercises      General Comments General comments (skin integrity, edema, etc.): Pt on 2 LPM O2 at rest with sats 94%; decreased to 87% on RA at rest.  Ambulated on 2 LPM O2 with sats 91%, attempted 1 LPM but sats to 87%.  Placed back on 2 LPM O2 at rest.      Pertinent Vitals/Pain Pain Assessment: No/denies pain    Home Living  Prior Function            PT Goals (current goals can now be found in the care plan section) Acute Rehab PT Goals Patient Stated Goal: improve balance PT Goal Formulation: With patient/family Time For Goal Achievement: 10/01/19 Potential to Achieve Goals: Good Progress towards PT goals: Progressing toward goals    Frequency    Min 3X/week      PT Plan Current plan remains appropriate    Co-evaluation              AM-PAC PT "6 Clicks" Mobility   Outcome Measure  Help needed turning from your back to your side while in a flat bed without using bedrails?: None Help needed moving from lying on your back to sitting on the side of a flat bed without using bedrails?: None Help needed moving to and from a bed to a chair (including a wheelchair)?: None Help needed standing up from a chair using your  arms (e.g., wheelchair or bedside chair)?: None Help needed to walk in hospital room?: A Little Help needed climbing 3-5 steps with a railing? : A Little 6 Click Score: 22    End of Session Equipment Utilized During Treatment: Oxygen Activity Tolerance: Patient limited by fatigue Patient left: with call bell/phone within reach;with chair alarm set;in chair;with family/visitor present Nurse Communication: Mobility status PT Visit Diagnosis: Muscle weakness (generalized) (M62.81);Unsteadiness on feet (R26.81)     Time: 1540-1601 PT Time Calculation (min) (ACUTE ONLY): 21 min  Charges:  $Gait Training: 8-22 mins                     Abran Richard, PT Acute Rehab Services Pager [phone] [facility] 09/18/2019, 4:13 PM

## 2019-09-18 NOTE — Evaluation (Signed)
Occupational Therapy Evaluation Patient Details Name: George Vega MRN: [id number] DOB: 19-Mar-1940 Today's Date: 09/18/2019    History of Present Illness George Vega is a 79 y.o. male with medical history significant of CVA; stage 1 RLL SCC; HTN; CAD; afib; and CHF presenting with SOB.  He has been having trouble.  He started chemo 2 1/2 weeks ago.  Since then, he has been having problems with his breathing, more pronounced.  It was intermittent.  A few days ago, he started with panting, wheezing.  Difficulty sleeping and staying awake.  It got worse last night and they had to come in.  +audible wheezing.  + cough.  He does not wear home O2 but they think he needs it   Clinical Impression   [name] is a 79 year old man admitted to hospital with shortness of breath. On evaluation he demonstrates good upper body strength but decreased cardiopulmonary endurance on 2.5 L, decreased activity tolerance, confusion and poor balance resulting in decreased ability to perform independent ADLs and mobility. Patient will benefit from skilled OT services to improve deficits and learn compensatory strategies as needed in order to improve functional abilities.    Follow Up Recommendations  No OT follow up    Equipment Recommendations  None recommended by OT    Recommendations for Other Services       Precautions / Restrictions Precautions Precautions: Fall Restrictions Weight Bearing Restrictions: No      Mobility Bed Mobility Overal bed mobility: Needs Assistance Bed Mobility: Supine to Sit     Supine to sit: HOB elevated;Supervision     General bed mobility comments: increase time and use of bed rails to transfer to side of bed.  Transfers Overall transfer level: Needs assistance Equipment used: Rolling walker (2 wheeled) Transfers: Sit to/from Stand Sit to Stand: Min assist         General transfer comment: min assist for steadying.    Balance Overall balance  assessment: Needs assistance Sitting-balance support: No upper extremity supported;Feet supported Sitting balance-Leahy Scale: Good     Standing balance support: Single extremity supported Standing balance-Leahy Scale: Fair                             ADL either performed or assessed with clinical judgement   ADL Overall ADL's : Needs assistance/impaired Eating/Feeding: Independent   Grooming: Wash/dry hands;Wash/dry face;Standing;Min guard Grooming Details (indicate cue type and reason): Performed grooming standing at the sink with min guard due to impaired balance Upper Body Bathing: Set up;Sitting   Lower Body Bathing: Set up;Sit to/from stand;Minimal assistance   Upper Body Dressing : Set up;Sitting   Lower Body Dressing: Set up;Sit to/from stand;Minimal assistance Lower Body Dressing Details (indicate cue type and reason): Patient able to donn socks in seated position. Min guard for standing lower body dressing. Toilet Transfer: Conservation officer, nature Details (indicate cue type and reason): Distance limited today due to lines and leads and decreased cardiopulmonary endurance Toileting- Clothing Manipulation and Hygiene: Sit to/from stand;Min guard       Functional mobility during ADLs: Rolling walker;Minimal assistance       Vision   Vision Assessment?: No apparent visual deficits     Perception     Praxis      Pertinent Vitals/Pain Pain Assessment: No/denies pain     Hand Dominance Left   Extremity/Trunk Assessment Upper Extremity Assessment Upper Extremity Assessment: Overall WFL for  tasks assessed   Lower Extremity Assessment Lower Extremity Assessment: Defer to PT evaluation   Cervical / Trunk Assessment Cervical / Trunk Assessment: Normal   Communication Communication Communication: No difficulties   Cognition Arousal/Alertness: Awake/alert Behavior During Therapy: WFL for tasks assessed/performed Overall  Cognitive Status: No family/caregiver present to determine baseline cognitive functioning                                 General Comments: Mildly confused with decreased memory.   General Comments       Exercises     Shoulder Instructions      Home Living Family/patient expects to be discharged to:: Private residence Living Arrangements: Spouse/significant other Available Help at Discharge: Family;Available 24 hours/day Type of Home: House Home Access: Stairs to enter [employer] of Steps: 2 Entrance Stairs-Rails: Right Home Layout: Able to live on main level with bedroom/bathroom     Bathroom Shower/Tub: Occupational psychologist: Standard Bathroom Accessibility: Yes How Accessible: Accessible via walker Home Equipment: [place] - single point;Bedside commode;Shower seat;Walker - 4 wheels          Prior Functioning/Environment Level of Independence: Independent with assistive device(s)        Comments: Reports usinga rollator  for 3 weeks due to decreasing strength and balance. Independence with ADLs.        OT Problem List: Decreased activity tolerance;Impaired balance (sitting and/or standing);Decreased knowledge of use of DME or AE;Decreased safety awareness;Decreased cognition      OT Treatment/Interventions: Self-care/ADL training;Therapeutic exercise;Patient/family education;Energy conservation;Cognitive remediation/compensation;Therapeutic activities;Balance training;DME and/or AE instruction    OT Goals(Current goals can be found in the care plan section) Acute Rehab OT Goals Patient Stated Goal: improve balance OT Goal Formulation: With patient Time For Goal Achievement: 10/02/19 Potential to Achieve Goals: Good  OT Frequency: Min 2X/week   Barriers to D/C:            Co-evaluation              AM-PAC OT "6 Clicks" Daily Activity     Outcome Measure Help from another person eating meals?: None Help from  another person taking care of personal grooming?: A Little Help from another person toileting, which includes using toliet, bedpan, or urinal?: A Little Help from another person bathing (including washing, rinsing, drying)?: A Little Help from another person to put on and taking off regular upper body clothing?: A Little Help from another person to put on and taking off regular lower body clothing?: A Little 6 Click Score: 19   End of Session Equipment Utilized During Treatment: Gait belt;Rolling walker Nurse Communication: Mobility status  Activity Tolerance: Patient tolerated treatment well Patient left: in chair;with call bell/phone within reach;with chair alarm set  OT Visit Diagnosis: Unsteadiness on feet (R26.81);Dizziness and giddiness (R42)                Time: 7494-4967 OT Time Calculation (min): 27 min Charges:  OT General Charges $OT Visit: 1 Visit OT Evaluation $OT Eval Moderate Complexity: 1 Mod OT Treatments $Self Care/Home Management : 8-22 mins  Ashira Kirsten, OTR/L [place] [phone] Pager: [phone]   Lenward Chancellor 09/18/2019, 12:06 PM

## 2019-09-19 ENCOUNTER — Inpatient Hospital Stay (HOSPITAL_COMMUNITY): Payer: Medicare Other

## 2019-09-19 ENCOUNTER — Other Ambulatory Visit: Payer: Self-pay | Admitting: Cardiology

## 2019-09-19 DIAGNOSIS — I361 Nonrheumatic tricuspid (valve) insufficiency: Secondary | ICD-10-CM

## 2019-09-19 DIAGNOSIS — I340 Nonrheumatic mitral (valve) insufficiency: Secondary | ICD-10-CM

## 2019-09-19 DIAGNOSIS — J9601 Acute respiratory failure with hypoxia: Secondary | ICD-10-CM

## 2019-09-19 LAB — POCT I-STAT 7, (LYTES, BLD GAS, ICA,H+H)
Acid-Base Excess: 3 mmol/L — ABNORMAL HIGH (ref 0.0–2.0)
Bicarbonate: 29.3 mmol/L — ABNORMAL HIGH (ref 20.0–28.0)
Calcium, Ion: 1.26 mmol/L (ref 1.15–1.40)
HCT: 37 % — ABNORMAL LOW (ref 39.0–52.0)
Hemoglobin: 12.6 g/dL — ABNORMAL LOW (ref 13.0–17.0)
O2 Saturation: 100 %
Patient temperature: 98.6
Potassium: 4.6 mmol/L (ref 3.5–5.1)
Sodium: 139 mmol/L (ref 135–145)
TCO2: 31 mmol/L (ref 22–32)
pCO2 arterial: 48.6 mmHg — ABNORMAL HIGH (ref 32.0–48.0)
pH, Arterial: 7.388 (ref 7.350–7.450)
pO2, Arterial: 259 mmHg — ABNORMAL HIGH (ref 83.0–108.0)

## 2019-09-19 LAB — URINALYSIS, ROUTINE W REFLEX MICROSCOPIC
Bilirubin Urine: NEGATIVE
Glucose, UA: NEGATIVE mg/dL
Ketones, ur: NEGATIVE mg/dL
Leukocytes,Ua: NEGATIVE
Nitrite: NEGATIVE
Protein, ur: 30 mg/dL — AB
RBC / HPF: >50 RBC/hpf — ABNORMAL HIGH (ref 0–5)
Specific Gravity, Urine: 1.01 (ref 1.005–1.030)
pH: 5 (ref 5.0–8.0)

## 2019-09-19 LAB — PROCALCITONIN: Procalcitonin: 0.16 ng/mL

## 2019-09-19 LAB — ECHOCARDIOGRAM COMPLETE
Height: 69 in
Weight: 3441.29 oz

## 2019-09-19 LAB — MRSA PCR SCREENING: MRSA by PCR: NEGATIVE

## 2019-09-19 MED ORDER — METHYLPREDNISOLONE SODIUM SUCC 125 MG IJ SOLR
80.0000 mg | INTRAMUSCULAR | Status: AC
  Administered 2019-09-19: 80 mg via INTRAVENOUS
  Filled 2019-09-19: qty 2

## 2019-09-19 MED ORDER — SODIUM CHLORIDE 0.9 % IV SOLN
1.0000 g | INTRAVENOUS | Status: DC
  Administered 2019-09-19 – 2019-09-24 (×6): 1 g via INTRAVENOUS
  Filled 2019-09-19: qty 10
  Filled 2019-09-19: qty 1
  Filled 2019-09-19: qty 10
  Filled 2019-09-19: qty 1
  Filled 2019-09-19: qty 10
  Filled 2019-09-19 (×2): qty 1

## 2019-09-19 MED ORDER — ACETAMINOPHEN 325 MG PO TABS
650.0000 mg | ORAL_TABLET | Freq: Four times a day (QID) | ORAL | Status: DC | PRN
  Administered 2019-09-21: 650 mg via ORAL
  Filled 2019-09-19: qty 2

## 2019-09-19 MED ORDER — LORAZEPAM 2 MG/ML IJ SOLN: 1.0000 mg | Freq: Once | INTRAMUSCULAR | Status: DC

## 2019-09-19 MED ORDER — ORAL CARE MOUTH RINSE
15.0000 mL | Freq: Two times a day (BID) | OROMUCOSAL | Status: DC
  Administered 2019-09-20 – 2019-09-23 (×8): 15 mL via OROMUCOSAL

## 2019-09-19 MED ORDER — SODIUM CHLORIDE 0.9 % IV BOLUS
250.0000 mL | Freq: Once | INTRAVENOUS | Status: AC
  Administered 2019-09-19: 250 mL via INTRAVENOUS

## 2019-09-19 MED ORDER — LORAZEPAM 2 MG/ML IJ SOLN
1.0000 mg | Freq: Once | INTRAMUSCULAR | Status: AC
  Administered 2019-09-19: 1 mg via INTRAVENOUS

## 2019-09-19 MED ORDER — ALBUMIN HUMAN 25 % IV SOLN
25.0000 g | Freq: Once | INTRAVENOUS | Status: AC
  Administered 2019-09-19: 25 g via INTRAVENOUS
  Filled 2019-09-19: qty 100

## 2019-09-19 MED ORDER — ACETAMINOPHEN 650 MG RE SUPP
650.0000 mg | Freq: Four times a day (QID) | RECTAL | Status: DC | PRN
  Administered 2019-09-19: 650 mg via RECTAL
  Filled 2019-09-19: qty 1

## 2019-09-19 MED ORDER — DEXMEDETOMIDINE HCL IN NACL 200 MCG/50ML IV SOLN
0.4000 ug/kg/h | INTRAVENOUS | Status: DC
  Administered 2019-09-19: 0.8 ug/kg/h via INTRAVENOUS
  Administered 2019-09-19 (×3): 0.4 ug/kg/h via INTRAVENOUS
  Filled 2019-09-19: qty 100

## 2019-09-19 MED ORDER — CHLORHEXIDINE GLUCONATE 0.12 % MT SOLN
15.0000 mL | Freq: Two times a day (BID) | OROMUCOSAL | Status: DC
  Administered 2019-09-19 – 2019-09-24 (×11): 15 mL via OROMUCOSAL
  Filled 2019-09-19 (×11): qty 15

## 2019-09-19 MED ORDER — FUROSEMIDE 10 MG/ML IJ SOLN
60.0000 mg | INTRAMUSCULAR | Status: AC
  Administered 2019-09-19: 40 mg via INTRAVENOUS

## 2019-09-19 MED ORDER — LABETALOL HCL 5 MG/ML IV SOLN
10.0000 mg | INTRAVENOUS | Status: DC | PRN
  Filled 2019-09-19: qty 4

## 2019-09-19 MED ORDER — FUROSEMIDE 10 MG/ML IJ SOLN
INTRAMUSCULAR | Status: AC
  Filled 2019-09-19: qty 4

## 2019-09-19 MED ORDER — CHLORHEXIDINE GLUCONATE CLOTH 2 % EX PADS
6.0000 | MEDICATED_PAD | Freq: Every day | CUTANEOUS | Status: DC
  Administered 2019-09-19 – 2019-09-21 (×3): 6 via TOPICAL

## 2019-09-19 MED ORDER — METHYLPREDNISOLONE SODIUM SUCC 40 MG IJ SOLR
40.0000 mg | Freq: Four times a day (QID) | INTRAMUSCULAR | Status: DC
  Administered 2019-09-19 – 2019-09-21 (×8): 40 mg via INTRAVENOUS
  Filled 2019-09-19 (×8): qty 1

## 2019-09-19 MED ORDER — ACETAMINOPHEN 325 MG PO TABS: 650.0000 mg | ORAL_TABLET | Freq: Four times a day (QID) | ORAL | Status: DC | PRN

## 2019-09-19 MED ORDER — SODIUM CHLORIDE 0.9 % IV SOLN: 250.0000 mL | INTRAVENOUS | Status: DC

## 2019-09-19 MED ORDER — PHENYLEPHRINE HCL-NACL 10-0.9 MG/250ML-% IV SOLN
25.0000 ug/min | INTRAVENOUS | Status: DC
  Administered 2019-09-19: 25 ug/min via INTRAVENOUS
  Filled 2019-09-19: qty 250

## 2019-09-19 NOTE — Progress Notes (Signed)
  Echocardiogram 2D Echocardiogram has been performed.  Jennette Dubin 09/19/2019, 2:43 PM

## 2019-09-19 NOTE — Progress Notes (Signed)
PROGRESS NOTE    George Vega  EXN:[id number] DOB: [birth year] DOA: 09/17/2019 PCP: [name], MD   Chef Complaints: Shortness of breath  Brief Narrative: As per Dr. Lorin Mercy" 79 y.o. male with medical history significant of CVA; stage 1 RLL SCC; HTN; CAD; afib; and CHF presenting with SOB.  He has been having trouble. He started chemo 2 1/2 weeks ago. Since then, he has been having problems with his breathing, more pronounced. It was intermittent. A few days ago, he started with panting, wheezing. Difficulty sleeping and staying awake. It got worse last night and they had to come in. +audible wheezing. + cough. He does not wear home O2 but they think he needs it. His pulse ox has been in the 90s at home. +LE edema, L >R, intermittently. No obvious orthopnea. +PND. Panting with minimal exertion. No fevers. Mild rhinorrhea x 2 days." ED CT negative for PE BNP elevated in 500 patient hypoxic in the 80s given IV Lasix, also had leukocytosis could be from recent Neulasta, patient was admitted for further management.  Subjective: Acutely short of breath this am with wheezing, resp distress. Able to speak words. Denies any chest pain nausea or fever Moving to SDU   Assessment & Plan:  Acute on chronic hypoxic respiratory failure/Acute resp distress:presumed to be from COPD exacerbation but no clear diagnosis of COPD, noted to have emphysema on the imaging.  CT negative for PE or pneumonia, mildly elevated BNP but no edema on chest x-ray.he received lasix in ED.Patient does have lung cancer improved right hilar and mediastinal adenopathy on recent CT.suspecting  Multifactorial with lung ca  Deconditioning, copd, r/o CHF. G1DD in echo in 2020. This am in resp distress- wheezing b/l and basal crackles- iv solumedrol 80 mg x1, lasix 40 mg iv x1, change po to iv steroid, ordered cxr, bnp, procal. Echo pending.  Consulted pulmonary critical care and transferred to ICU.  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 Julien Nordmann.  May need palliative care evaluation.  Remains full code for now.  Leukocytosis likely from Neulasta end of jun 28, steroid, On doxycycline.Monitor. wbc worsening.  But he is afebrile.  No pneumonia on CTA chest on admission.  Repeat chest x-ray low lung volumes. Recent Labs  Lab 09/15/19 1215 09/17/19 0111 09/18/19 0946  WBC 18.9* 26.1* 32.3*   Hyperlipemia: on statin.  HTN: Pressure was high in the 180s during respiratory distress.   Paroxysmal atrial fibrillation: Rate controlled continue Eliquis amiodarone and Lopressor.   Obesity (BMI 30.0-34.9): Will benefit with weight loss and healthy lifestyle.  Patient transferred to stepdown then changed to ICU status attending changed to critical care given patient's worsening respiratory status needing new BiPAP, he had agitation was placed on Precedex as Ativan did not work, blood pressure running low on Precedex and being weaned/meds adjusted.Critical care has accepted the patient under their service. Continue critical care. Patient wife updated at bedside. Overall prognosis remains to be seen given his metastatic lung cancer he likely does not have bad prognosis is at high risk of acute decompensation  DVT prophylaxis:ELIQUIS Code Status: full Family Communication: plan of care discussed with patient at bedside.  Updated patient's wife at bedside in ICU  Status is: inpatient Patient remains hospitalized for ongoing management of respiratory failure remains on oxygen, continue PT OT.  Now with respiratory distress moving to stepdown/ICU  Dispo: The patient is from: Home               Anticipated d/c is to: TBD.              Anticipated d/c date is: >3 days              Patient currently is not medically stable to d/c.  Nutrition: Diet Order            Diet Heart Room service appropriate? Yes; Fluid consistency: Thin  Diet effective now                 Body mass index is 31.76 kg/m.  Consultants: PCCM. Procedures:see note Microbiology:see note  Medications: Scheduled Meds: . amiodarone  200 mg Oral Daily  . apixaban  5 mg Oral BID  . baclofen  10 mg Oral BID  . doxycycline  100 mg Oral Q12H  . feeding supplement (ENSURE ENLIVE)  237 mL Oral Q24H  . ferrous sulfate  325 mg Oral BID WC  . furosemide  60 mg Intravenous STAT  . ipratropium-albuterol  3 mL Nebulization TID  . magnesium oxide  400 mg Oral BID  . methylPREDNISolone (SOLU-MEDROL) injection  80 mg Intravenous STAT  . metoprolol tartrate  25 mg Oral BID  . multivitamin  1 tablet Oral Daily  . pantoprazole  40 mg Oral Daily  . PARoxetine  40 mg Oral QHS  . polycarbophil  1,250 mg Oral QHS  . predniSONE  40 mg Oral Q breakfast  . rosuvastatin  5 mg Oral Daily   Continuous Infusions:   Antimicrobials: Anti-infectives (From admission, onward)   Start     Dose/Rate Route Frequency Ordered Stop   09/17/19 1000  doxycycline (VIBRA-TABS) tablet 100 mg     Discontinue     100 mg Oral Every 12 hours 09/17/19 0814 09/22/19 0959       Objective: Vitals: Today's Vitals   09/18/19 2049 09/18/19 2150 09/19/19 0520 09/19/19 0600  BP:  120/77 118/74   Pulse:  90 71   Resp:  20 20   Temp:  97.6 F (36.4 C) 97.6 F (36.4 C)   TempSrc:  Oral    SpO2: 93% 95% 99% 100%  Weight:      Height:      PainSc:  0-No pain      Intake/Output Summary (Last 24 hours) at 09/19/2019 0840 Last data filed at 09/19/2019 0500 Gross per 24 hour  Intake 236 ml  Output 650 ml  Net -414 ml   Filed Weights   09/17/19 0102  Weight: 97.6 kg   Weight change:    Intake/Output from previous day: 07/09  0701 - 07/10 0700 In: 354 [P.O.:354] Out: 950 [Urine:950] Intake/Output this shift: No intake/output data recorded.  Examination:  General exam: AAOx3,in distress, weak appearing. HEENT:Oral mucosa moist, Ear/Nose WNL grossly, dentition normal. Respiratory system: bilaterally wheezing, basal crackles +, use of accessory muscle Cardiovascular system: S1 & S2 +, No JVD,. Gastrointestinal system: Abdomen soft, NT,ND, BS+ Nervous System:Alert, awake, moving extremities and grossly nonfocal Extremities: No edema, distal peripheral pulses palpable.  Skin: No rashes,no icterus. MSK: Normal muscle bulk,tone, power  Data Reviewed: I have personally reviewed following labs and imaging studies CBC: Recent Labs  Lab 09/15/19 1215 09/17/19 0111 09/18/19 0946  WBC 18.9* 26.1*  32.3*  NEUTROABS 12.2* 18.0*  --   HGB 11.4* 11.4* 11.5*  HCT 35.0* 35.1* 36.0*  MCV 96.2 97.8 100.0  PLT 64* 110* 010   Basic Metabolic Panel: Recent Labs  Lab 09/15/19 1215 09/17/19 0111 09/18/19 0946  NA 137 136 135  K 4.1 4.1 4.4  CL 102 102 99  CO2 25 23 24   GLUCOSE 137* 127* 194*  BUN 10 8 19   CREATININE 0.95 0.90 1.15  CALCIUM 8.9 8.6* 8.8*   GFR: Estimated Creatinine Clearance: 61 mL/min (by C-G formula based on SCr of 1.15 mg/dL). Liver Function Tests: Recent Labs  Lab 09/15/19 1215 09/17/19 0111  AST 35 58*  ALT 52* 66*  ALKPHOS 210* 219*  BILITOT 0.5 0.8  PROT 6.2* 5.9*  ALBUMIN 3.2* 3.0*   No results for input(s): LIPASE, AMYLASE in the last 168 hours. No results for input(s): AMMONIA in the last 168 hours. Coagulation Profile: No results for input(s): INR, PROTIME in the last 168 hours. Cardiac Enzymes: No results for input(s): CKTOTAL, CKMB, CKMBINDEX, TROPONINI in the last 168 hours. BNP (last 3 results) No results for input(s): PROBNP in the last 8760 hours. HbA1C: No results for input(s): HGBA1C in the last 72 hours. CBG: No results for input(s): GLUCAP in the last 168  hours. Lipid Profile: No results for input(s): CHOL, HDL, LDLCALC, TRIG, CHOLHDL, LDLDIRECT in the last 72 hours. Thyroid Function Tests: No results for input(s): TSH, T4TOTAL, FREET4, T3FREE, THYROIDAB in the last 72 hours. Anemia Panel: No results for input(s): VITAMINB12, FOLATE, FERRITIN, TIBC, IRON, RETICCTPCT in the last 72 hours. Sepsis Labs: No results for input(s): PROCALCITON, LATICACIDVEN in the last 168 hours.  Recent Results (from the past 240 hour(s))  SARS Coronavirus 2 by RT PCR (hospital order, performed in [facility] hospital lab) Nasopharyngeal Nasopharyngeal Swab     Status: None   Collection Time: 09/17/19  2:48 AM   Specimen: Nasopharyngeal Swab  Result Value Ref Range Status   SARS Coronavirus 2 NEGATIVE NEGATIVE Final    Comment: (NOTE) SARS-CoV-2 target nucleic acids are NOT DETECTED.  The SARS-CoV-2 RNA is generally detectable in upper and lower respiratory specimens during the acute phase of infection. The lowest concentration of SARS-CoV-2 viral copies this assay can detect is 250 copies / mL. A negative result does not preclude SARS-CoV-2 infection and should not be used as the sole basis for treatment or other patient management decisions.  A negative result may occur with improper specimen collection / handling, submission of specimen other than nasopharyngeal swab, presence of viral mutation(s) within the areas targeted by this assay, and inadequate number of viral copies (<250 copies / mL). A negative result must be combined with clinical observations, patient history, and epidemiological information.  Fact Sheet for Patients:   StrictlyIdeas.no  Fact Sheet for Healthcare Providers: BankingDealers.co.za  This test is not yet approved or  cleared by the [place] FDA and has been authorized for detection and/or diagnosis of SARS-CoV-2 by FDA under an Emergency Use Authorization (EUA).  This EUA  will remain in effect (meaning this test can be used) for the duration of the COVID-19 declaration under Section 564(b)(1) of the Act, 21 U.S.C. section 360bbb-3(b)(1), unless the authorization is terminated or revoked sooner.  Performed at [facility], [place] [address], [place], [place] [address]       Radiology Studies: No results found.   LOS: 1 day   Antonieta Pert, MD [facility]  09/19/2019, 8:40 AM

## 2019-09-19 NOTE — Clinical Social Work Note (Signed)
Patient was seen for TOC work up but CSW entered information through wrong portal.  See note dated 6/23 for current note.

## 2019-09-19 NOTE — Progress Notes (Signed)
Pt transferred to [facility].

## 2019-09-19 NOTE — Consult Note (Signed)
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 Cindee Salt, etoposide, Imfinzi-as 2 cycles  History of CVA, coronary artery disease, hypertension, atrial fibrillation, history of congestive heart failure Initially admitted with shortness of breath He was having shortness of breath, wheezing, panting respirations-reason for admission  Past Medical History   Past Medical History:  Diagnosis Date  . Arthritis   . Atrial fibrillation, persistent ([place])    on Eliquis  . Cancer ([place])    liver  . CHF (congestive heart failure) ([place])   . Chronic sinusitis   . Coronary artery disease   . Depression   . Difficult intubation    was told with shoulder done 2006-alittle narrow  . 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 Hypertension   . Jejunostomy tube fell out    when asked about this in 04/2017, pt denied ever having a J tube, feeding tube, tubes placed post surgery so ??? veracity of a previous J tube.    . 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 Spastic colon   . 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 Stroke (cerebrum) ([place])   . SVT (supraventricular tachycardia) ([place])    Significant Hospital Events   Worsening shortness of breath this morning  leading to transfer to the intensive care unit  Consults:  pccm  Procedures:  None  Significant Diagnostic Tests:  09/17/2019-chest CT IMPRESSION: 1. Negative for pulmonary embolism or other acute finding. 2. History of lung cancer with improved right hilar and mediastinal adenopathy. 3. Aortic Atherosclerosis (ICD10-I70.0) and Emphysema (ICD10-J43.9). 4. Interval but nonacute superior endplate fractures at T2 and T3 when compared to July 06, 2019 CT.  Micro Data:    Antimicrobials:  Doxycycline 7/8>>  Interim history/subjective:  Worsening shortness of breath this morning, increasing wheezing  Objective   Blood pressure (!) 189/93, pulse (!) 109, temperature 97.6 F (36.4 C), resp. rate 20, height 5' 9"  (1.753 m), weight 97.6 kg, SpO2 100 %.        Intake/Output Summary (Last 24 hours) at 09/19/2019 0951 Last data filed at 09/19/2019 0500 Gross per 24 hour  Intake 236 ml  Output 650 ml  Net -414 ml   Filed Weights   09/17/19 0102  Weight: 97.6 kg    Examination: General: Elderly gentleman, increased work of breathing HENT: Moist oral mucosa Lungs: Wheezes Cardiovascular: S1-S2 appreciated Abdomen: Soft, bowel sounds appreciated Extremities: No clubbing, no edema Neuro: Alert, oriented to person, agitated GU: Good output  Recent CT chest reviewed by myself, chest x-ray 7/10 reviewed by myself showing some atelectasis, no clear-cut new infiltrate  Resolved Hospital Problem list     Assessment & Plan:  Exacerbation of obstructive lung disease -Bronchodilators-on DuoNeb 3 times daily -Steroids-Solu-Medrol 40 every 6 -Continue doxycycline  Acute hypoxemic respiratory failure -  Continue with BiPAP support -We will transition to an nasal cannula when more stable  Severe agitation -Did get a couple of doses of Ativan 1 mg -Will start on Precedex  Decompensated congestive heart failure -Did receive diuretics -Cautious diuresis -Monitor  electrolytes  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 -Metastasis -Currently on chemotherapy  Leukocytosis likely related to recent Neulasta and possibly steroids  Hyperlipidemia -On statins  Hypertension -Home medications  Paroxysmal atrial fibrillation -On Eliquis, amiodarone, Lopressor   Best practice:  Diet: Diet as tolerated once able to get off BiPAP Pain/Anxiety/Delirium protocol (if indicated): Did receive Ativan, starting Precedex VAP protocol (if indicated): Not indicated DVT prophylaxis: On anticoagulation GI prophylaxis: Protonix Glucose control:  Mobility: Bedrest Code Status: Full code Family Communication: Will update Disposition: Stepdown  Labs   CBC: Recent Labs  Lab 09/15/19 1215 09/17/19 0111 09/18/19 0946  WBC 18.9* 26.1* 32.3*  NEUTROABS 12.2* 18.0*  --   HGB 11.4* 11.4* 11.5*  HCT 35.0* 35.1* 36.0*  MCV 96.2 97.8 100.0  PLT 64* 110* 416    Basic Metabolic Panel: Recent Labs  Lab 09/15/19 1215 09/17/19 0111 09/18/19 0946  NA 137 136 135  K 4.1 4.1 4.4  CL 102 102 99  CO2 25 23 24   GLUCOSE 137* 127* 194*  BUN 10 8 19   CREATININE 0.95 0.90 1.15  CALCIUM 8.9 8.6* 8.8*   GFR: Estimated Creatinine Clearance: 61 mL/min (by C-G formula based on SCr of 1.15 mg/dL). Recent Labs  Lab 09/15/19 1215 09/17/19 0111 09/18/19 0946  WBC 18.9* 26.1* 32.3*    Liver Function Tests: Recent Labs  Lab 09/15/19 1215 09/17/19 0111  AST 35 58*  ALT 52* 66*  ALKPHOS 210* 219*  BILITOT 0.5 0.8  PROT 6.2* 5.9*  ALBUMIN 3.2* 3.0*   No results for input(s): LIPASE, AMYLASE in the last 168 hours. No results for input(s): AMMONIA in the last 168 hours.  ABG    Component Value Date/Time   PHART 7.361 02/14/2019 0812   PCO2ART 43.7 02/14/2019 0812   PO2ART 86.0 02/14/2019 0812   HCO3 24.8 02/14/2019 0812   TCO2 26 02/14/2019 0812   ACIDBASEDEF 1.0 02/14/2019 0812   O2SAT 96.0 02/14/2019 0812     Coagulation  Profile: No results for input(s): INR, PROTIME in the last 168 hours.  Cardiac Enzymes: No results for input(s): CKTOTAL, CKMB, CKMBINDEX, TROPONINI in the last 168 hours.  HbA1C: No results found for: HGBA1C  CBG: No results for input(s): GLUCAP in the last 168 hours.  Review of Systems:   Awake and alert Shortness of breath  Past Medical History  He,  has a past medical history of Arthritis, Atrial fibrillation, persistent ([place]), Cancer ([place]), CHF (congestive heart failure) ([place]), Chronic sinusitis, Coronary artery disease, Depression, Difficult intubation, Gait disorder (05/28/2014), Hypertension, Jejunostomy tube fell out, Occlusion and stenosis of vertebral artery (05/28/2014), Spastic colon, Squamous cell carcinoma of lung, stage I, right ([place]) (05/07/2018), Stroke (cerebrum) ([place]), and SVT (supraventricular tachycardia) ([place]).   Surgical History    Past Surgical History:  Procedure Laterality Date  . ATRIAL FIBRILLATION ABLATION N/A 02/11/2019   Procedure: ATRIAL FIBRILLATION ABLATION;  Surgeon: Constance Haw, MD;  Location: [facility];  Service: Cardiovascular;  Laterality: N/A;  . CARDIAC CATHETERIZATION    . CATARACT EXTRACTION, BILATERAL    . COLONOSCOPY WITH PROPOFOL N/A 04/17/2017   Procedure: COLONOSCOPY WITH PROPOFOL;  Surgeon: Yetta Flock, MD;  Location: [facility];  Service: Gastroenterology;  Laterality: N/A;  . ESOPHAGEAL DILATION  2017  . ESOPHAGOGASTRODUODENOSCOPY (EGD) WITH PROPOFOL N/A 04/17/2017   Procedure: ESOPHAGOGASTRODUODENOSCOPY (EGD) WITH PROPOFOL;  Surgeon: Yetta Flock, MD;  Location: [facility];  Service: Gastroenterology;  Laterality: N/A;  . EYE SURGERY    . FOOT NEUROMA SURGERY  2002  . LEFT HEART CATH AND CORONARY ANGIOGRAPHY N/A 01/16/2019   Procedure: LEFT HEART CATH AND CORONARY ANGIOGRAPHY;  Surgeon: Jettie Booze, MD;  Location: [facility];  Service: Cardiovascular;  Laterality: N/A;  . LEFT HEART  CATHETERIZATION WITH CORONARY ANGIOGRAM Right 06/14/2011   20% LM, chronic occluded mid LAD, 50% ostial LCX, 20% mid RI, RCA with collaterals to mid LAD, mid 10% stenosis, EF 60% 06/14/11  . NASAL SINUS SURGERY    . SHOULDER ARTHROSCOPY  03/01/2011   Procedure: ARTHROSCOPY SHOULDER;  Surgeon: Ninetta Lights, MD;  Location: [facility];  Service: Orthopedics;  Laterality: Right;  arthroscopy shoulder decompression subacromial partial acromioplasty with coracoacromial release, distal claviculectomy, debridement of labrium  . SHOULDER ARTHROSCOPY  03/01/2011   Procedure: ARTHROSCOPY SHOULDER;  Surgeon: Ninetta Lights, MD;  Location: [facility];  Service: Orthopedics;  Laterality: Right;  arthroscopy shoulder decompression subacromial partial acromioplasty with coracoacromial release, distal claviculectomy, debridement of labrium  . TEE WITHOUT CARDIOVERSION N/A 03/19/2016   Procedure: TRANSESOPHAGEAL ECHOCARDIOGRAM (TEE);  Surgeon: Larey Dresser, MD;  Location: [facility];  Service: Cardiovascular;  Laterality: N/A;  . TOTAL KNEE ARTHROPLASTY Right 09/23/2013   Procedure: TOTAL KNEE ARTHROPLASTY;  Surgeon: Ninetta Lights, MD;  Location: [facility];  Service: Orthopedics;  Laterality: Right;  . TOTAL SHOULDER ARTHROPLASTY  06/28/2011   Procedure: TOTAL SHOULDER ARTHROPLASTY;  Surgeon: Ninetta Lights, MD;  Location: [facility];  Service: Orthopedics;  Laterality: Right;  . UVULOPALATOPHARYNGOPLASTY    . VIDEO ASSISTED THORACOSCOPY (VATS)/WEDGE RESECTION Right 06/02/2018   Procedure: VIDEO ASSISTED THORACOSCOPY (VATS)/WEDGE RESECTION with Lymph node disection and intercostal nerve block.;  Surgeon: [name], MD;  Location: [facility];  Service: Thoracic;  Laterality: Right;     Social History   reports that he quit smoking about 21 years ago. His smoking use included cigarettes. He has a 60.00 pack-year smoking history. He has never used smokeless tobacco. He  reports current alcohol use of about 14.0 standard drinks of alcohol per week. He reports that he does not use drugs.   Family History   His family history includes Cancer in his mother; Heart attack in his father; Migraines in his brother.   Allergies Allergies  Allergen Reactions  . Glucosamine-Chondroitin Anaphylaxis and Other (See Comments)    Stomach cramps, can't eat   . Tetanus Toxoid Anaphylaxis and Hives    hives/throat swells shut   . Fish Oil Other (See Comments)    Stomach cramps, can't eat   . Ibuprofen Nausea Only and Other (See Comments)    Stomach pains   . Naproxen Diarrhea  . Glucosamine-Chondroitin Other (See Comments)    Cramps  . Ibuprofen Other (See Comments)    Stomach cramps  . Morphine And Related     Confusion  . Naproxen Other (See Comments)    Stomach cramps    The patient is critically ill with multiple organ systems failure and requires high complexity decision making for assessment and support, frequent evaluation and titration of therapies, application of advanced monitoring technologies and extensive interpretation of multiple databases. Critical Care Time devoted to patient care services described in  this note independent of APP/resident time (if applicable)  is 32 minutes.   Sherrilyn Rist MD [facility] Personal pager: [phone] If unanswered, please page CCM On-call: [phone]

## 2019-09-19 NOTE — Progress Notes (Signed)
Writer in pt's room to note pt in respiratory distress. Wheezing, HR 118, R44. RT here and giving 8L/O2 with Neb tx. MD called to bedside. RR called.

## 2019-09-19 NOTE — Progress Notes (Signed)
Pt currently on 2 LPM [place] and tolerating well at this time, BIPAP not indicated.  RT to monitor and assess as needed.

## 2019-09-19 NOTE — Progress Notes (Addendum)
This Probation officer spoke to patient's wife and  updated her on patient's transfer to ICU.

## 2019-09-19 NOTE — Progress Notes (Signed)
MD returned call and stated to send pt to stepdown. RR here.

## 2019-09-19 NOTE — TOC Progression Note (Signed)
Transition of Care [facility]) - Progression Note    Patient Details  Name: George Vega MRN: [id number] Date of Birth: [birth year]  Transition of Care [facility] CM/SW [place], [place] Phone Number: 09/19/2019, 2:01 PM  Clinical Narrative:   Patient has been accepted for [facility] PT services by Santiago Glad with [facility].  If patient leaves on low flow O2, he prefers [facility] so he can get a compact concentrator. TOC will continue to follow during the course of hospitalization.     Expected Discharge Plan: [place] Barriers to Discharge: No Barriers Identified  Expected Discharge Plan and Services Expected Discharge Plan: [place]                                               Social Determinants of Health (SDOH) Interventions    Readmission Risk Interventions No flowsheet data found.

## 2019-09-19 NOTE — TOC Initial Note (Signed)
Transition of Care [facility]) - Initial/Assessment Note    Patient Details  Name: George Vega MRN: [id number] Date of Birth: 07/19/1940  Transition [facility] Care [facility] CM/SW Contact:    Trish Mage, LCSW Phone Number: 09/19/2019, 10:22 AM  Clinical Narrative:    Patient seen in follow up to PT recommendation of [facility] yesterday PM.  Mr Brar is currently on 2L of O2, and feels it is helping immensely.  He is good with going home with O2 if it is still needed at d/c, and I explained how we would get that for him.  If he is on low floww, he would prefer getting it through [facility] so he can get the small concentrator.  Beyond that, he has all needed DME in home as he had knee replacement several years ago.  He is open to [facility] PT with no stated preference of provider.  TOC will continue to follow during the course of hospitalization.              Expected Discharge Plan: [place] Barriers to Discharge: No Barriers Identified   Patient Goals and CMS Choice Patient states their goals for this hospitalization and ongoing recovery are:: "If I need O2, I want O2.  It's helping me feel better."   Choice offered to / list presented to : Patient, Spouse  Expected Discharge Plan and Services Expected Discharge Plan: [place]   Discharge Planning Services: CM Consult Post Acute Care Choice: Durable Medical Equipment Living arrangements for the past 2 months: Single Family Home                                      Prior Living Arrangements/Services Living arrangements for the past 2 months: Single Family Home Lives with:: Spouse Patient language and need for interpreter reviewed:: Yes Do you feel safe going back to the place where you live?: Yes      Need for Family Participation in Patient Care: Yes (Comment) Care giver support system in place?: Yes (comment) Current home services: DME Criminal Activity/Legal Involvement Pertinent to Current  Situation/Hospitalization: No - Comment as needed  Activities of Daily Living      Permission Sought/Granted Permission sought to share information with : Family Supports Permission granted to share information with : Yes, Verbal Permission Granted  Share Information with NAME: wife           Emotional Assessment Appearance:: Appears stated age Attitude/Demeanor/Rapport: Engaged Affect (typically observed): Appropriate Orientation: : Oriented to Self, Oriented to Place, Oriented to  Time, Oriented to Situation Alcohol / Substance Use: Not Applicable Psych Involvement: No (comment)  Admission diagnosis:  Fall , Shoulder Pain  Patient Active Problem List   Diagnosis Date Noted  . COPD with acute exacerbation ([place]) 09/18/2019  . Acute respiratory failure with hypoxia ([place]) 09/17/2019  . Obesity (BMI 30.0-34.9) 09/17/2019  . Encounter for antineoplastic chemotherapy 08/28/2019  . Encounter for antineoplastic immunotherapy 08/28/2019  . Goals of care, counseling/discussion 08/28/2019  . 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 Elevated troponin   . Acute respiratory failure ([place]) 02/14/2019  . H/O cardiac radiofrequency ablation 02/14/2019  . Acute on chronic diastolic CHF (congestive heart failure) ([place])   . Paroxysmal atrial fibrillation ([place]) 02/11/2019  . Lung cancer ([place]) 06/02/2018  . Prostate nodule 05/15/2018  . Squamous cell carcinoma of lung, stage I,  right ([place]) 05/07/2018  . AVM (arteriovenous malformation) of colon without hemorrhage   . Gastric AVM   . Chronic anticoagulation   . Iron deficiency anemia due to chronic blood loss   . Symptomatic anemia 04/16/2017  . 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 Prediabetes 01/30/2017  . Vitamin D deficiency 01/30/2017  . Multiple pulmonary nodules determined by computed tomography of lung 10/09/2016  . Pulmonary nodules 10/09/2016   . Persistent atrial fibrillation ([place]) 03/19/2016  . Upper airway cough syndrome 01/21/2016  . Dyspnea on exertion 01/20/2016  . SVT (supraventricular tachycardia) ([place]) 11/01/2015  . Atrial tachycardia ([place]) 09/08/2015  . 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 BPH (benign prostatic hyperplasia) 05/19/2014  . CVA (cerebral vascular accident) ([place]) 02/01/2014  . Benign paroxysmal positional vertigo 01/12/2014  . Headache 01/12/2014  . DTs (delirium tremens) ([place]) 09/26/2013  . 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 Ataxia 04/02/2013  . Leukocytosis 04/02/2013  . CAD (coronary artery disease) 04/02/2013  . Hyperlipemia 04/02/2013  . HTN (hypertension) 04/02/2013  . Abnormal MRA, brain 11/06/2012  . Arthralgia 09/02/2012  . Synovitis of wrist 07/25/2012  . OA (osteoarthritis) 07/25/2012  . Spastic colon 05/03/2011  . Arthritis 05/03/2011  . Coronary atherosclerosis 11/04/2008  . DEPRESSION 11/03/2008  . DYSTHYMIC DISORDER 10/13/2008  . STRESS ELECTROCARDIOGRAM, ABNORMAL 10/13/2008   PCP:  [name], MD Pharmacy:   [facility], [place] [address] [place] [address] Phone: [phone] Fax: [place], [address] [facility] [address], [address] [place], [address] [place] [address] Phone: [phone] Fax: [phone]  [facility] [place] [place] - [address] [place] [address] Phone: [phone] Fax: [phone]     Social Determinants of Health (SDOH) Interventions    Readmission Risk Interventions No flowsheet data found.

## 2019-09-20 ENCOUNTER — Inpatient Hospital Stay (HOSPITAL_COMMUNITY): Payer: Medicare Other

## 2019-09-20 LAB — CBC WITH DIFFERENTIAL/PLATELET
Abs Immature Granulocytes: 2.09 10*3/uL — ABNORMAL HIGH (ref 0.00–0.07)
Basophils Absolute: 0.1 10*3/uL (ref 0.0–0.1)
Basophils Relative: 0 %
Eosinophils Absolute: 0 10*3/uL (ref 0.0–0.5)
Eosinophils Relative: 0 %
HCT: 31.4 % — ABNORMAL LOW (ref 39.0–52.0)
Hemoglobin: 10.1 g/dL — ABNORMAL LOW (ref 13.0–17.0)
Immature Granulocytes: 8 %
Lymphocytes Relative: 4 %
Lymphs Abs: 1 10*3/uL (ref 0.7–4.0)
MCH: 32 pg (ref 26.0–34.0)
MCHC: 32.2 g/dL (ref 30.0–36.0)
MCV: 99.4 fL (ref 80.0–100.0)
Monocytes Absolute: 1.3 10*3/uL — ABNORMAL HIGH (ref 0.1–1.0)
Monocytes Relative: 5 %
Neutro Abs: 23 10*3/uL — ABNORMAL HIGH (ref 1.7–7.7)
Neutrophils Relative %: 83 %
Platelets: 226 10*3/uL (ref 150–400)
RBC: 3.16 MIL/uL — ABNORMAL LOW (ref 4.22–5.81)
RDW: 14.3 % (ref 11.5–15.5)
WBC: 27.5 10*3/uL — ABNORMAL HIGH (ref 4.0–10.5)
nRBC: 0 % (ref 0.0–0.2)

## 2019-09-20 LAB — BASIC METABOLIC PANEL
Anion gap: 9 (ref 5–15)
BUN: 29 mg/dL — ABNORMAL HIGH (ref 8–23)
CO2: 29 mmol/L (ref 22–32)
Calcium: 8.6 mg/dL — ABNORMAL LOW (ref 8.9–10.3)
Chloride: 98 mmol/L (ref 98–111)
Creatinine, Ser: 1.08 mg/dL (ref 0.61–1.24)
GFR calc Af Amer: >60 mL/min (ref >60)
GFR calc non Af Amer: >60 mL/min (ref >60)
Glucose, Bld: 174 mg/dL — ABNORMAL HIGH (ref 70–99)
Potassium: 5 mmol/L (ref 3.5–5.1)
Sodium: 136 mmol/L (ref 135–145)

## 2019-09-20 LAB — BRAIN NATRIURETIC PEPTIDE: B Natriuretic Peptide: 185.5 pg/mL — ABNORMAL HIGH (ref 0.0–100.0)

## 2019-09-20 LAB — PROCALCITONIN: Procalcitonin: 0.78 ng/mL

## 2019-09-20 LAB — URINE CULTURE: Culture: NO GROWTH

## 2019-09-20 LAB — GLUCOSE, CAPILLARY: Glucose-Capillary: 188 mg/dL — ABNORMAL HIGH (ref 70–99)

## 2019-09-20 MED ORDER — IPRATROPIUM-ALBUTEROL 0.5-2.5 (3) MG/3ML IN SOLN
3.0000 mL | Freq: Two times a day (BID) | RESPIRATORY_TRACT | Status: DC
  Administered 2019-09-20 – 2019-09-22 (×4): 3 mL via RESPIRATORY_TRACT
  Filled 2019-09-20 (×4): qty 3

## 2019-09-20 NOTE — Progress Notes (Signed)
Patients wife took all his valuables (cell phone, ipad, clothes) home. Only belonging pt has at bedside are his eye glasses.

## 2019-09-20 NOTE — Plan of Care (Signed)
  Problem: Education: Goal: Knowledge of General Education information will improve Description: Including pain rating scale, medication(s)/side effects and non-pharmacologic comfort measures Outcome: Progressing   Problem: Health Behavior/Discharge Planning: Goal: Ability to manage health-related needs will improve Outcome: Progressing   Problem: Clinical Measurements: Goal: Ability to maintain clinical measurements within normal limits will improve Outcome: Progressing Goal: Respiratory complications will improve Outcome: Progressing   Problem: Nutrition: Goal: Adequate nutrition will be maintained Outcome: Progressing   Problem: Elimination: Goal: Will not experience complications related to bowel motility Outcome: Progressing Goal: Will not experience complications related to urinary retention Outcome: Progressing

## 2019-09-20 NOTE — Progress Notes (Signed)
NAME:  George Vega, MRN:  [id number], DOB:  1941-01-03, LOS: 2 ADMISSION DATE:  09/17/2019, CONSULTATION DATE:  09/19/2019 REFERRING MD: Dr. 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 Cindee Salt, etoposide, Imfinzi-as 2 cycles  History of CVA, coronary artery disease, hypertension, atrial fibrillation, history of congestive heart failure Initially admitted with shortness of breath He was having shortness of breath, wheezing, panting respirations-reason for admission  Past Medical History   Past Medical History:  Diagnosis Date  . Arthritis   . Atrial fibrillation, persistent ([place])    on Eliquis  . Cancer ([place])    liver  . CHF (congestive heart failure) ([place])   . Chronic sinusitis   . Coronary artery disease   . Depression   . Difficult intubation    was told with shoulder done 2006-alittle narrow  . 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 Hypertension   . Jejunostomy tube fell out    when asked about this in 04/2017, pt denied ever having a J tube, feeding tube, tubes placed post surgery so ??? veracity of a previous J tube.    . 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 Spastic colon   . 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 Stroke (cerebrum) ([place])   . SVT (supraventricular tachycardia) ([place])    Significant Hospital Events    Worsening shortness of breath this morning  leading to transfer to the intensive care unit Consults:   pccm Procedures:  none  Significant Diagnostic Tests:   09/17/2019-chest CT IMPRESSION: 1. Negative for pulmonary embolism or other acute finding. 2. History of lung cancer with improved right hilar and mediastinal adenopathy. 3. Aortic Atherosclerosis (ICD10-I70.0) and Emphysema (ICD10-J43.9). 4. Interval but nonacute superior endplate fractures at T2 and T3 when compared to July 06, 2019 CT. Micro Data:   Blood culture 7/10>> Antimicrobials:    Rocephin 7/11>> Doxycycline 7/8>> Interim history/subjective:  Overnight did stabilize On 2 L oxygen Hemodynamically better Objective   Blood pressure 130/61, pulse 71, temperature (!) 97.5 F (36.4 C), temperature source Oral, resp. rate (!) 22, height _0  (1.753 m), weight 97.6 kg, SpO2 93 %.        Intake/Output Summary (Last 24 hours) at 09/20/2019 1526 Last data filed at 09/20/2019 1300 Gross per 24 hour  Intake 541.08 ml  Output 1100 ml  Net -558.92 ml   Filed Weights   09/17/19 0102  Weight: 97.6 kg    Examination: General: Elderly gentleman, does not appear to be in distress HENT: Moist oral mucosa Lungs: Decreased air movement bilaterally Cardiovascular: S1-S2 appreciated Abdomen: Bowel sounds appreciated Extremities: No clubbing, no edema Neuro: Alert and oriented x3 GU: [facility] Problem list     Assessment & Plan:  Acute hypoxemic respiratory failure -Oxygen supplementation  Exacerbation of COPD -Continue bronchodilators -Continue steroids -Continue doxycycline  New fever May be related to a pneumonia -Blood cultures negative so far -  Continue Rocephin and doxycycline  Decompensated congestive heart failure -Cautious diuresis -Trend electrolytes  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 -He has metastatic disease -Currently on chemotherapy  Leukocytosis may be related to recent  Neulasta -May be secondary to an infectious process ongoing -Continue current antibiotics  Hyperlipidemia Hypertension Paroxysmal atrial fibrillation -On Eliquis -Amiodarone  Patient is improving  Best practice:  Diet: Cardiac diet Pain/Anxiety/Delirium protocol (if indicated): Was on Precedex, weaned off VAP protocol (if indicated): Not indicated DVT prophylaxis: On anticoagulation GI prophylaxis: Protonix Glucose control:  Mobility: Bedrest Code Status: Full code Family Communication: Updated spouse Disposition: Transfer to [facility]   CBC: Recent Labs  Lab 09/15/19 1215 09/17/19 0111 09/18/19 0946 09/19/19 0844 09/20/19 0249  WBC 18.9* 26.1* 32.3*  --  27.5*  NEUTROABS 12.2* 18.0*  --   --  23.0*  HGB 11.4* 11.4* 11.5* 12.6* 10.1*  HCT 35.0* 35.1* 36.0* 37.0* 31.4*  MCV 96.2 97.8 100.0  --  99.4  PLT 64* 110* 186  --  333    Basic Metabolic Panel: Recent Labs  Lab 09/15/19 1215 09/17/19 0111 09/18/19 0946 09/19/19 0844 09/20/19 0249  NA 137 136 135 139 136  K 4.1 4.1 4.4 4.6 5.0  CL 102 102 99  --  98  CO2 _0 --  29  GLUCOSE 137* 127* 194*  --  174*  BUN _1 --  29*  CREATININE 0.95 0.90 1.15  --  1.08  CALCIUM 8.9 8.6* 8.8*  --  8.6*   GFR: Estimated Creatinine Clearance: 65 mL/min (by C-G formula based on SCr of 1.08 mg/dL). Recent Labs  Lab 09/15/19 1215 09/17/19 0111 09/18/19 0946 09/19/19 0920 09/20/19 0249  PROCALCITON  --   --   --  0.16 0.78  WBC 18.9* 26.1* 32.3*  --  27.5*    Liver Function Tests: Recent Labs  Lab 09/15/19 1215 09/17/19 0111  AST 35 58*  ALT 52* 66*  ALKPHOS 210* 219*  BILITOT 0.5 0.8  PROT 6.2* 5.9*  ALBUMIN 3.2* 3.0*   No results for input(s): LIPASE, AMYLASE in the last 168 hours. No results for input(s): AMMONIA in the last 168 hours.  ABG    Component Value Date/Time   PHART 7.388 09/19/2019 0844   PCO2ART 48.6 (H) 09/19/2019 0844   PO2ART 259 (H) 09/19/2019 0844   HCO3 29.3  (H) 09/19/2019 0844   TCO2 31 09/19/2019 0844   ACIDBASEDEF 1.0 02/14/2019 0812   O2SAT 100.0 09/19/2019 0844     Coagulation Profile: No results for input(s): INR, PROTIME in the last 168 hours.  Cardiac Enzymes: No results for input(s): CKTOTAL, CKMB, CKMBINDEX, TROPONINI in the last 168 hours.  HbA1C: No results found for: HGBA1C  CBG: No results for input(s): GLUCAP in the last 168 hours.  Review of Systems:   Improving Shortness of breath is better Denies any pain or discomfort Past Medical History  He,  has a past medical history of Arthritis, Atrial fibrillation, persistent ([place]), Cancer ([place]), CHF (congestive heart failure) ([place]), Chronic sinusitis, Coronary artery disease, Depression, Difficult intubation, Gait disorder (05/28/2014), Hypertension, Jejunostomy tube fell out, Occlusion and stenosis of vertebral artery (05/28/2014), Spastic colon, Squamous cell carcinoma of lung, stage I, right ([place]) (05/07/2018), Stroke (cerebrum) ([place]), and SVT (supraventricular tachycardia) ([place]).   Surgical History    Past Surgical History:  Procedure Laterality Date  . ATRIAL FIBRILLATION ABLATION N/A 02/11/2019   Procedure: ATRIAL FIBRILLATION ABLATION;  Surgeon: Constance Haw, MD;  Location: [facility];  Service: Cardiovascular;  Laterality: N/A;  . CARDIAC CATHETERIZATION    . CATARACT EXTRACTION, BILATERAL    . COLONOSCOPY WITH PROPOFOL N/A 04/17/2017   Procedure: COLONOSCOPY WITH PROPOFOL;  Surgeon: Yetta Flock, MD;  Location: [facility];  Service: Gastroenterology;  Laterality: N/A;  . ESOPHAGEAL DILATION  2017  . ESOPHAGOGASTRODUODENOSCOPY (EGD) WITH PROPOFOL N/A 04/17/2017   Procedure: ESOPHAGOGASTRODUODENOSCOPY (EGD) WITH PROPOFOL;  Surgeon: Yetta Flock, MD;  Location: [facility];  Service: Gastroenterology;  Laterality: N/A;  . EYE SURGERY    . FOOT NEUROMA SURGERY  2002  . LEFT HEART CATH AND CORONARY ANGIOGRAPHY N/A 01/16/2019   Procedure:  LEFT HEART CATH AND CORONARY ANGIOGRAPHY;  Surgeon: Jettie Booze, MD;  Location: [facility];  Service: Cardiovascular;  Laterality: N/A;  . LEFT HEART CATHETERIZATION WITH CORONARY ANGIOGRAM Right 06/14/2011   20% LM, chronic occluded mid LAD, 50% ostial LCX, 20% mid RI, RCA with collaterals to mid LAD, mid 10% stenosis, EF 60% 06/14/11  . NASAL SINUS SURGERY    . SHOULDER ARTHROSCOPY  03/01/2011   Procedure: ARTHROSCOPY SHOULDER;  Surgeon: Ninetta Lights, MD;  Location: [facility];  Service: Orthopedics;  Laterality: Right;  arthroscopy shoulder decompression subacromial partial acromioplasty with coracoacromial release, distal claviculectomy, debridement of labrium  . SHOULDER ARTHROSCOPY  03/01/2011   Procedure: ARTHROSCOPY SHOULDER;  Surgeon: Ninetta Lights, MD;  Location: [facility];  Service: Orthopedics;  Laterality: Right;  arthroscopy shoulder decompression subacromial partial acromioplasty with coracoacromial release, distal claviculectomy, debridement of labrium  . TEE WITHOUT CARDIOVERSION N/A 03/19/2016   Procedure: TRANSESOPHAGEAL ECHOCARDIOGRAM (TEE);  Surgeon: Larey Dresser, MD;  Location: [facility];  Service: Cardiovascular;  Laterality: N/A;  . TOTAL KNEE ARTHROPLASTY Right 09/23/2013   Procedure: TOTAL KNEE ARTHROPLASTY;  Surgeon: Ninetta Lights, MD;  Location: [facility];  Service: Orthopedics;  Laterality: Right;  . TOTAL SHOULDER ARTHROPLASTY  06/28/2011   Procedure: TOTAL SHOULDER ARTHROPLASTY;  Surgeon: Ninetta Lights, MD;  Location: [facility];  Service: Orthopedics;  Laterality: Right;  . UVULOPALATOPHARYNGOPLASTY    . VIDEO ASSISTED THORACOSCOPY (VATS)/WEDGE RESECTION Right 06/02/2018   Procedure: VIDEO ASSISTED THORACOSCOPY (VATS)/WEDGE RESECTION with Lymph node disection and intercostal nerve block.;  Surgeon: [name], MD;  Location: [facility];  Service: Thoracic;  Laterality: Right;     Social History    reports that he quit smoking about 21 years ago. His smoking use included cigarettes. He has a 60.00 pack-year smoking history. He has never used smokeless tobacco. He reports current alcohol use of about 14.0 standard drinks of alcohol per week. He reports that he does not use drugs.   Family History   His family history includes Cancer in his mother; Heart attack in his father; Migraines in his brother.   Allergies Allergies  Allergen Reactions  . Glucosamine-Chondroitin Anaphylaxis and Other (See Comments)    Stomach cramps, can't eat   . Tetanus Toxoid Anaphylaxis and Hives    hives/throat swells shut   . Fish Oil Other (See Comments)    Stomach cramps, can't eat   . Ibuprofen Nausea Only and Other (See Comments)    Stomach pains   . Naproxen Diarrhea  . Glucosamine-Chondroitin Other (See Comments)    Cramps  . Ibuprofen Other (See Comments)    Stomach cramps  . Morphine And Related     Confusion  . Naproxen Other (  See Comments)    Stomach cramps  . Sulfa Antibiotics Other (See Comments)    Unknown- "highly allergic" per family.      The patient is critically ill with multiple organ systems failure and requires high complexity decision making for assessment and support, frequent evaluation and titration of therapies, application of advanced monitoring technologies and extensive interpretation of multiple databases. Critical Care Time devoted to patient care services described in this note independent of APP/resident time (if applicable)  is 30 minutes.   Sherrilyn Rist MD [facility] Personal pager: [phone] If unanswered, please page CCM On-call: [phone]

## 2019-09-21 ENCOUNTER — Inpatient Hospital Stay: Payer: Medicare Other

## 2019-09-21 ENCOUNTER — Telehealth: Payer: Self-pay | Admitting: Internal Medicine

## 2019-09-21 ENCOUNTER — Inpatient Hospital Stay: Payer: Medicare Other | Admitting: Internal Medicine

## 2019-09-21 ENCOUNTER — Telehealth: Payer: Self-pay | Admitting: Medical Oncology

## 2019-09-21 ENCOUNTER — Encounter: Payer: Self-pay | Admitting: Internal Medicine

## 2019-09-21 DIAGNOSIS — R0600 Dyspnea, unspecified: Secondary | ICD-10-CM

## 2019-09-21 LAB — GLUCOSE, CAPILLARY
Glucose-Capillary: 157 mg/dL — ABNORMAL HIGH (ref 70–99)
Glucose-Capillary: 237 mg/dL — ABNORMAL HIGH (ref 70–99)
Glucose-Capillary: 208 mg/dL — ABNORMAL HIGH (ref 70–99)

## 2019-09-21 LAB — PROCALCITONIN: Procalcitonin: 0.39 ng/mL

## 2019-09-21 MED ORDER — METHYLPREDNISOLONE SODIUM SUCC 40 MG IJ SOLR
40.0000 mg | Freq: Three times a day (TID) | INTRAMUSCULAR | Status: DC
  Administered 2019-09-21 – 2019-09-22 (×3): 40 mg via INTRAVENOUS
  Filled 2019-09-21 (×3): qty 1

## 2019-09-21 NOTE — Progress Notes (Signed)
HEMATOLOGY-ONCOLOGY PROGRESS NOTE  SUBJECTIVE: The patient has been admitted due to acute hypoxemic respiratory failure.  He was in the ICU over the weekend due to respiratory rate in the 40s and requirement of 8 L of O2.  Breathing is much better this morning.  He is off oxygen.  Denies cough and hemoptysis.  He has no other specific complaints.  He was scheduled to be seen as an outpatient today for his next cycle of chemotherapy.  He indicates this morning that he is thinking about stopping chemotherapy.  Oncology History  Lung cancer (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9 % 1,000 mL chemo infusion, 100 mg/m2 = 220 mg, Intravenous,  Once, 1 of 4 cycles Administration: 220 mg (09/02/2019), 220 mg (09/03/2019), 220 mg (09/04/2019) fosaprepitant (EMEND) 150 mg in sodium chloride 0.9 % 145 mL IVPB, 150 mg, Intravenous,  Once, 1 of 4 cycles Administration: 150 mg (09/02/2019) durvalumab (IMFINZI) 1,500 mg in sodium chloride 0.9 % 100 mL chemo infusion, 1,500 mg,  Intravenous,  Once, 1 of 8 cycles Administration: 1,500 mg (09/02/2019)  for chemotherapy treatment.       REVIEW OF SYSTEMS:   Constitutional: Denies fevers, chills  Eyes: Denies blurriness of vision Ears, nose, mouth, throat, and face: Denies mucositis or sore throat Respiratory: Still with mild shortness of breath, but overall improved Cardiovascular: Denies palpitation, chest discomfort Gastrointestinal:  Denies nausea, heartburn or change in bowel habits Skin: Denies abnormal skin rashes Lymphatics: Denies new lymphadenopathy or easy bruising Neurological:Denies numbness, tingling or new weaknesses Behavioral/Psych: Mood is stable, no new changes  Extremities: No lower extremity edema All other systems were reviewed with the patient and are negative.  I have reviewed the past medical history, past surgical history, social history and family history with the patient and they are unchanged from previous note.   PHYSICAL EXAMINATION: ECOG PERFORMANCE STATUS: 1 - Symptomatic but completely ambulatory  Vitals:   09/21/19 0759 09/21/19 0829  BP:    Pulse: 64   Resp:    Temp:    SpO2: 92% 95%   Filed Weights   09/17/19 0102  Weight: 97.6 kg    Intake/Output from previous day: 07/11 0701 - 07/12 0700 In: 550 [P.O.:550] Out: 925 [Urine:925]  GENERAL:alert, no distress and comfortable SKIN: skin color, texture, turgor are normal, no rashes or significant lesions EYES: normal, Conjunctiva are pink and non-injected, sclera clear OROPHARYNX:no exudate, no erythema and lips, buccal mucosa, and tongue normal  NECK: supple, thyroid normal size, non-tender, without nodularity LYMPH:  no palpable lymphadenopathy in the cervical, axillary or inguinal LUNGS: Diminished bilateral  bases HEART: regular rate & rhythm and no murmurs and no lower extremity edema ABDOMEN:abdomen soft, non-tender and normal bowel sounds Musculoskeletal:no cyanosis of digits and no clubbing  NEURO:  alert & oriented x 3 with fluent speech, no focal motor/sensory deficits  LABORATORY DATA:  I have reviewed the data as listed CMP Latest Ref Rng & Units 09/20/2019 09/19/2019 09/18/2019  Glucose 70 - 99 mg/dL 174(H) - 194(H)  BUN 8 - 23 mg/dL 29(H) - 19  Creatinine 0.61 - 1.24 mg/dL 1.08 - 1.15  Sodium 135 - 145 mmol/L 136 139 135  Potassium 3.5 - 5.1 mmol/L 5.0 4.6 4.4  Chloride 98 - 111 mmol/L 98 - 99  CO2 22 - 32 mmol/L 29 - 24  Calcium 8.9 - 10.3 mg/dL 8.6(L) - 8.8(L)  Total Protein 6.5 - 8.1 g/dL - - -  Total Bilirubin 0.3 - 1.2 mg/dL - - -  Alkaline Phos 38 - 126 U/L - - -  AST 15 - 41 U/L - - -  ALT 0 - 44 U/L - - -    Lab Results  Component Value Date   WBC 27.5 (H) 09/20/2019   HGB 10.1 (L) 09/20/2019   HCT 31.4 (L) 09/20/2019   MCV 99.4 09/20/2019   PLT 226 09/20/2019   NEUTROABS 23.0 (H) 09/20/2019    CT Head Wo Contrast  Result Date: 09/02/2019 CLINICAL DATA:  Status post trauma. EXAM: CT HEAD WITHOUT CONTRAST TECHNIQUE: Contiguous axial images were obtained from the base of the skull through the vertex without intravenous contrast. COMPARISON:  Aug 03, 2019 FINDINGS: Brain: There is mild cerebral atrophy with widening of the extra-axial spaces and ventricular dilatation. There are areas of decreased attenuation within the white matter tracts of the supratentorial brain, consistent with microvascular disease changes. A chronic right basal ganglia lacunar infarct is seen. Vascular: No hyperdense vessel or unexpected calcification. Skull: Normal. Negative for fracture or focal lesion. Sinuses/Orbits: There is mild bilateral ethmoid sinus mucosal thickening. Other: None. IMPRESSION: 1. Generalized cerebral atrophy. 2. No acute intracranial abnormality. Electronically Signed   By: Virgina Norfolk M.D.   On: 09/02/2019 22:24   CT Angio Chest PE W/Cm &/Or Wo Cm  Result Date: 09/17/2019 CLINICAL DATA:  Hypoxemia EXAM: CT ANGIOGRAPHY CHEST WITH CONTRAST TECHNIQUE: Multidetector  CT imaging of the chest was performed using the standard protocol during bolus administration of intravenous contrast. Multiplanar CT image reconstructions and MIPs were obtained to evaluate the vascular anatomy. CONTRAST:  31mL OMNIPAQUE IOHEXOL 350 MG/ML SOLN COMPARISON:  07/21/2019 PET-CT FINDINGS: Cardiovascular: Satisfactory opacification of the pulmonary arteries to the segmental level. No evidence of pulmonary embolism. Normal heart size. No pericardial effusion. Multifocal coronary atherosclerosis. Mediastinum/Nodes: Right hilar and subcarinal adenopathy that is improved from prior. A subcarinal node measures 14 mm in short axis compared to 24 mm previously. Lungs/Pleura: Tracheobronchomalacia. Postoperative right lower lobe with sutures along the posterior major fissure. Emphysema. There is no edema, consolidation, effusion, or pneumothorax. Upper Abdomen: Liver lesions described on comparison PET CT are not seen on this study which is essentially a noncontrast phase of the liver. Musculoskeletal: No acute or aggressive finding. Pseudoarticulation between the upper right first 2 ribs. T2 and T3 superior endplate sclerosis and mild height loss which is new from 07/06/2019. Review of the MIP images confirms the above findings. IMPRESSION: 1. Negative for pulmonary embolism or other acute finding. 2. History of lung cancer with improved right hilar and mediastinal adenopathy. 3. Aortic Atherosclerosis (ICD10-I70.0) and Emphysema (ICD10-J43.9). 4.  Interval but nonacute superior endplate fractures at T2 and T3 when compared to July 06, 2019 CT. Electronically Signed   By: Monte Fantasia M.D.   On: 09/17/2019 06:10   DG Chest Port 1 View  Result Date: 09/20/2019 CLINICAL DATA:  Respiratory failure EXAM: PORTABLE CHEST 1 VIEW COMPARISON:  September 19, 2019 FINDINGS: There is atelectatic change in the right mid lung and right base regions. Lungs elsewhere are clear. Heart is borderline prominent, stable, with  pulmonary vascularity normal. No adenopathy. Status post total shoulder replacements bilaterally. IMPRESSION: Atelectatic changes in the right mid lung and right base regions, essentially stable. Lungs otherwise clear. Stable cardiac prominence. Electronically Signed   By: Lowella Grip III M.D.   On: 09/20/2019 07:53   DG Chest Port 1 View  Result Date: 09/19/2019 CLINICAL DATA:  Oxygen desaturation and high BP EXAM: PORTABLE CHEST 1 VIEW COMPARISON:  Chest x-rays dated 09/17/2019 and 04/13/2019. FINDINGS: Study is hypoinspiratory with crowding of the bilateral perihilar and bibasilar bronchovascular markings. Probable associated atelectasis at the LEFT lung base. Lungs otherwise clear. No pleural effusion or pneumothorax is seen. Heart size and mediastinal contours appear stable. IMPRESSION: Low lung volumes. Probable associated atelectasis at the LEFT lung base. No evidence of pneumonia or pulmonary edema. Electronically Signed   By: Franki Cabot M.D.   On: 09/19/2019 09:56   DG Chest Portable 1 View  Result Date: 09/17/2019 CLINICAL DATA:  Dyspnea EXAM: PORTABLE CHEST 1 VIEW COMPARISON:  04/13/2019 FINDINGS: Cardiac shadow is enlarged. Aortic calcifications are again noted. Chronic fibrotic changes are seen bilaterally. No focal confluent infiltrate is seen. Bilateral shoulder replacements are noted. IMPRESSION: Chronic scarring without acute abnormality. Electronically Signed   By: Inez Catalina M.D.   On: 09/17/2019 02:31   DG Shoulder Left  Result Date: 09/02/2019 CLINICAL DATA:  Fall getting out of wheelchair, left shoulder pain. EXAM: LEFT SHOULDER - 2+ VIEW COMPARISON:  None. FINDINGS: Left shoulder arthroplasty in expected alignment. No periprosthetic lucency or fracture. The remainder of the shoulder is intact without acute fracture. No abnormality of the included ribs. IMPRESSION: Left shoulder arthroplasty in expected alignment without complication. No acute fracture. Electronically  Signed   By: Keith Rake M.D.   On: 09/02/2019 21:42   ECHOCARDIOGRAM COMPLETE  Result Date: 09/19/2019    ECHOCARDIOGRAM REPORT   Patient Name:   George Vega Date of Exam: 09/19/2019 Medical Rec #:  [id number]        Height:       69.0 in Accession #:    [id number]       Weight:       215.1 lb Date of Birth:  [birth year]       BSA:          2.131 m Patient Age:    55 years         BP:           88/50 mmHg Patient Gender: M                HR:           73 bpm. Exam Location:  Inpatient Procedure: 2D Echo Indications:    Dyspnea; CHF  History:        Patient has prior history of Echocardiogram examinations, most                 recent 02/14/2019. CAD, Arrythmias:Atrial Fibrillation; Risk  Factors:Hypertension.  Sonographer:    Mikki Santee RDCS (AE) Referring Phys: [id number] [place] IMPRESSIONS  1. Left ventricular ejection fraction, by estimation, is 60 to 65%. The left ventricle has normal function. The left ventricle has no regional wall motion abnormalities. Left ventricular diastolic parameters were normal.  2. Right ventricular systolic function is moderately reduced. The right ventricular size is moderately enlarged. There is moderately elevated pulmonary artery systolic pressure.  3. Left atrial size was mildly dilated.  4. The mitral valve is normal in structure. Mild mitral valve regurgitation. No evidence of mitral stenosis.  5. The aortic valve is tricuspid. Aortic valve regurgitation is not visualized. Mild aortic valve sclerosis is present, with no evidence of aortic valve stenosis.  6. The inferior vena cava is normal in size with greater than 50% respiratory variability, suggesting right atrial pressure of 3 mmHg. FINDINGS  Left Ventricle: Left ventricular ejection fraction, by estimation, is 60 to 65%. The left ventricle has normal function. The left ventricle has no regional wall motion abnormalities. The left ventricular internal cavity size was normal in size. There  is  no left ventricular hypertrophy. Left ventricular diastolic parameters were normal. Right Ventricle: The right ventricular size is moderately enlarged. No increase in right ventricular wall thickness. Right ventricular systolic function is moderately reduced. There is moderately elevated pulmonary artery systolic pressure. The tricuspid  regurgitant velocity is 3.18 m/s, and with an assumed right atrial pressure of 8 mmHg, the estimated right ventricular systolic pressure is 82.9 mmHg. Left Atrium: Left atrial size was mildly dilated. Right Atrium: Right atrial size was normal in size. Pericardium: Trivial pericardial effusion is present. Mitral Valve: The mitral valve is normal in structure. There is mild thickening of the mitral valve leaflet(s). There is mild calcification of the mitral valve leaflet(s). Normal mobility of the mitral valve leaflets. Mild mitral annular calcification. Mild mitral valve regurgitation. No evidence of mitral valve stenosis. Tricuspid Valve: The tricuspid valve is normal in structure. Tricuspid valve regurgitation is mild . No evidence of tricuspid stenosis. Aortic Valve: The aortic valve is tricuspid. Aortic valve regurgitation is not visualized. Mild aortic valve sclerosis is present, with no evidence of aortic valve stenosis. Pulmonic Valve: The pulmonic valve was normal in structure. Pulmonic valve regurgitation is not visualized. No evidence of pulmonic stenosis. Aorta: The aortic root is normal in size and structure. Venous: The inferior vena cava is normal in size with greater than 50% respiratory variability, suggesting right atrial pressure of 3 mmHg. IAS/Shunts: No atrial level shunt detected by color flow Doppler.  LEFT VENTRICLE PLAX 2D LVIDd:         5.20 cm  Diastology LVIDs:         3.20 cm  LV e' lateral:   9.46 cm/s LV PW:         1.10 cm  LV E/e' lateral: 10.3 LV IVS:        1.10 cm  LV e' medial:    6.42 cm/s LVOT diam:     2.30 cm  LV E/e' medial:  15.2 LV SV:          75 LV SV Index:   35 LVOT Area:     4.15 cm  RIGHT VENTRICLE RV S prime:     12.20 cm/s TAPSE (M-mode): 0.9 cm LEFT ATRIUM             Index       RIGHT ATRIUM  Index LA diam:        4.20 cm 1.97 cm/m  RA Area:     16.80 cm LA Vol (A2C):   47.9 ml 22.48 ml/m RA Volume:   46.30 ml  21.73 ml/m LA Vol (A4C):   56.5 ml 26.52 ml/m LA Biplane Vol: 56.1 ml 26.33 ml/m  AORTIC VALVE LVOT Vmax:   101.00 cm/s LVOT Vmean:  73.500 cm/s LVOT VTI:    0.180 m  AORTA Ao Root diam: 3.00 cm MITRAL VALVE               TRICUSPID VALVE MV Area (PHT): 3.42 cm    TR Peak grad:   40.4 mmHg MV Decel Time: 222 msec    TR Vmax:        318.00 cm/s MV E velocity: 97.90 cm/s MV A velocity: 96.10 cm/s  SHUNTS MV E/A ratio:  1.02        Systemic VTI:  0.18 m                            Systemic Diam: 2.30 cm George Rouge MD Electronically signed by George Rouge MD Signature Date/Time: 09/19/2019/3:02:35 PM    Final     ASSESSMENT AND PLAN: Is a very pleasant 79 year old white male with metastatic non-small cell lung cancer, poorly differentiated carcinoma with neuroendocrine features diagnosed in June 2021.  He was initially diagnosed with a stage Ib non-small cell lung cancer, squamous cell carcinoma status post superior segmentectomy of the right lower lobe with lymph node dissection in March 2020 under the care of Dr. Servando Snare.  Tumor size was 2.7 cm with visceropleural involvement.  On imaging studies, he was found to have evidence for disease metastasis in the liver as well as the lung but no evidence for metastatic disease to the brain.  Ultrasound-guided core biopsy of one of the metastatic liver lesions was consistent with metastatic poorly differentiated carcinoma with neuroendocrine features.  The patient is currently receiving systemic chemotherapy with carboplatin, etoposide, and Imfinzi status post 1 cycle.  First cycle was started on 09/02/2019.  He has had some difficulty with hypotension and fatigue.  Now  admitted with acute hypoxic respiratory failure which is improving.  The patient was scheduled to be seen in our office today to begin his second cycle of chemotherapy.  However, we will need to reschedule this to next week.  I have sent a scheduling message.    The patient is indicated today that he is thinking about stopping chemotherapy.  He is concerned about his quality of life.  Discussed with Dr. Earlie Server who recommends follow-up next week further discussion regarding his treatment options.  I have tentatively scheduled him for chemotherapy next week, but this can be canceled very easily if he opts not to pursue any additional treatment.  We will plan to see him back as outpatient follow-up.  Please call medical oncology if any additional questions arise during this hospitalization.   LOS: 3 days   Mikey Bussing, DNP, AGPCNP-BC, AOCNP 09/21/19

## 2019-09-21 NOTE — Progress Notes (Signed)
Occupational Therapy Treatment Patient Details Name: George Vega MRN: [id number] DOB: 1941/02/17 Today's Date: 09/21/2019    History of present illness 79 y.o. male with medical history significant of CVA; stage 1 RLL SCC; HTN; CAD; afib; and CHF presenting with SOB.  He has been having trouble.  He started chemo 2 1/2 weeks ago.  Since then, he has been having problems with his breathing, more pronounced.  It was intermittent.  A few days ago, he started with panting, wheezing.  Difficulty sleeping and staying awake.  It got worse last night and they had to come in.  +audible wheezing.  + cough.  He does not wear home O2 but they think he needs it.  Admitted with resp failure.   OT comments  Energy conservation handout provided  Follow Up Recommendations  No OT follow up    Equipment Recommendations  None recommended by OT    Recommendations for Other Services      Precautions / Restrictions Precautions Precautions: Fall       Mobility Bed Mobility Overal bed mobility: Modified Independent             General bed mobility comments: increased time and required use of bedrails  Transfers Overall transfer level: Needs assistance Equipment used: Rolling walker (2 wheeled) Transfers: Sit to/from Stand Sit to Stand: Supervision         General transfer comment: cues for safe hand placement    Balance Overall balance assessment: Needs assistance Sitting-balance support: No upper extremity supported;Feet supported Sitting balance-Leahy Scale: Good     Standing balance support: Bilateral upper extremity supported;During functional activity Standing balance-Leahy Scale: Poor Standing balance comment: required use of RW                           ADL either performed or assessed with clinical judgement   ADL Overall ADL's : Needs assistance/impaired                                       General ADL Comments: OT provided handout on  energy conservation.  OT and pt went over in detail.  Pt very appreciative.  Pt shared he feels very anxious when he is short of breath.     Vision Patient Visual Report: No change from baseline            Cognition Arousal/Alertness: Awake/alert Behavior During Therapy: WFL for tasks assessed/performed Overall Cognitive Status: Within Functional Limits for tasks assessed                                                     Pertinent Vitals/ Pain       Pain Assessment: No/denies pain         Frequency  Min 2X/week        Progress Toward Goals  OT Goals(current goals can now be found in the care plan section)  Progress towards OT goals: Progressing toward goals     Plan Discharge plan remains appropriate       AM-PAC OT "6 Clicks" Daily Activity     Outcome Measure   Help from another person eating meals?: None Help from another person taking care of personal  grooming?: A Little Help from another person toileting, which includes using toliet, bedpan, or urinal?: A Little Help from another person bathing (including washing, rinsing, drying)?: A Little Help from another person to put on and taking off regular upper body clothing?: A Little Help from another person to put on and taking off regular lower body clothing?: A Little 6 Click Score: 19    End of Session Equipment Utilized During Treatment: Rolling walker  OT Visit Diagnosis: Unsteadiness on feet (R26.81);Dizziness and giddiness (R42)   Activity Tolerance Patient tolerated treatment well   Patient Left in chair;with call bell/phone within reach;with chair alarm set   Nurse Communication Mobility status        Time: 6213-0865 OT Time Calculation (min): 28 min  Charges: OT General Charges $OT Visit: 1 Visit OT Treatments $Self Care/Home Management : 23-37 mins  Kari Baars, [place] [phone] Office- [phone],  Edwena Felty D 09/21/2019, 6:17 PM

## 2019-09-21 NOTE — Telephone Encounter (Signed)
LVM -cancelled appts this week and pt will be r/s for next week.

## 2019-09-21 NOTE — Care Management Important Message (Signed)
Important Message  Patient Details IM Letter given to Dessa Phi RN Case Manager to present to the Patient Name: George Vega MRN: [id number] Date of Birth: 12-12-1940   Medicare Important Message Given:  Yes     Kerin Salen 09/21/2019, 12:08 PM

## 2019-09-21 NOTE — Telephone Encounter (Signed)
Scheduled appt per 7/12 sch msg - pt wife is aware of appts.

## 2019-09-21 NOTE — Telephone Encounter (Signed)
Prescription refill request for Eliquis received.  Last office visit: 06/25/2019, [place] Scr: 1.08, 09/20/2019 Age: 79 y.o. Weight: 97.6 kg   Prescription refill sent.

## 2019-09-21 NOTE — Progress Notes (Signed)
PROGRESS NOTE    George Vega  PPJ:[id number] DOB: 07-25-1940 DOA: 09/17/2019 PCP: [name], MD   Chef Complaints: Shortness of breath  Brief Narrative: As per Dr. Lorin Mercy" 79 y.o. male with medical history significant of CVA; stage 1 RLL SCC; HTN; CAD; afib; and CHF presenting with SOB.  He has been having trouble. He started chemo 2 1/2 weeks ago. Since then, he has been having problems with his breathing, more pronounced. It was intermittent. A few days ago, he started with panting, wheezing. Difficulty sleeping and staying awake. It got worse last night and they had to come in. +audible wheezing. + cough. He does not wear home O2 but they think he needs it. His pulse ox has been in the 90s at home. +LE edema, L >R, intermittently. No obvious orthopnea. +PND. Panting with minimal exertion. No fevers. Mild rhinorrhea x 2 days." ED CT negative for PE BNP elevated in 500 patient hypoxic in the 80s given IV Lasix, also had leukocytosis could be from recent Neulasta, patient was admitted for further management.  09/18/09 Acutely short of breath this am with wheezing, resp distress- sent ot ICU on bipap, steroid iv and s/pp iv lasix.  Subjective:  Resting well on RA, coughing some feels from rt lung.  Patient indicated that he is thinking of staying with family with whatever time he has and is stopping chemotherapy. Interested in discussing more about palliative care and also he wants to discuss with oncology about this matter  Assessment & Plan:  Acute on chronic hypoxic respiratory failure/Acute resp distress suspecting acute COPD exacerbation with significant wheezing: Patient was sent to stepdown on BiPAP on 7/10, stabilized transfer out. hypoxia resolved.  Currently on room air.  Still having wheezing continue on bronchodilator, IV steroid Solu-Medrol will wean to every 8 hours, cont IV antibiotics with ceftriaxone doxycycline.  By PCCM.CXR 7/11  " Atelectatic changes in  the right mid lung and right base regions, essentially stable."  Acute decompensated diastolic CHF, with BNP in 500 on admission.  Patient needed iv lasix. Patient echocardiogram was reviewed echocardiogram relatively unremarkable.   New fever in ICU, unclear infection. ? Pneumonia- blood cultures negative so far.  Continue ceftriaxone doxycycline.  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 Julien Nordmann. nterested in discussing more about palliative care and also he wants to discuss with oncology about this matter.  In chemo.  Seen by oncology and they have plan for outpatient follow-up to discuss this.  Palliative care consulted to discuss [place].  Leukocytosis likely from Neulasta end of jun 28, steroid, possible infection.  WC count improving.  Continue empiric antibiotics as above.  CT on admission no acute finding.Marland Kitchen Recent Labs  Lab 09/15/19 1215 09/17/19 0111 09/18/19 0946 09/20/19 0249  WBC 18.9* 26.1* 32.3* 27.5*   Hyperlipemia: Continue statin  HTN: BP well controlled on metoprolol  PAF rate controlled on amiodarone metoprolol and Eliquis.  Obesity (BMI 30.0-34.9): Will benefit with weight loss and healthy lifestyle.  DVT prophylaxis:ELIQUIS Code Status: full Family Communication: plan of care discussed with patient at bedside.  Status is: inpatient Patient remains hospitalized for ongoing management of his wheezing shortness of breath.  Awaiting  for further input by palliative care, and oncology.   stepdown/ICU  Dispo: The patient is from: Home              Anticipated d/c is to: Home              Anticipated d/c date is: 1  day              Patient currently is not medically stable to d/c.  Nutrition: Diet Order            Diet Heart Room service appropriate? Yes; Fluid consistency: Thin  Diet effective now                 Body mass index is 31.76 kg/m.  Consultants: PCCM. Procedures:see note Microbiology:see note  Medications: Scheduled Meds: . amiodarone  200 mg Oral Daily  . apixaban  5 mg Oral BID  . baclofen  10 mg Oral BID  . chlorhexidine  15 mL Mouth Rinse BID  . Chlorhexidine Gluconate Cloth  6 each Topical Daily  . doxycycline  100 mg Oral Q12H  . feeding supplement (ENSURE ENLIVE)  237 mL Oral Q24H  . ferrous sulfate  325 mg Oral BID WC  . ipratropium-albuterol  3 mL Nebulization BID  . magnesium oxide  400 mg Oral BID  . mouth rinse  15 mL Mouth Rinse q12n4p  . methylPREDNISolone (SOLU-MEDROL) injection  40 mg Intravenous Q6H  . metoprolol tartrate  25 mg Oral BID  . multivitamin  1 tablet Oral Daily  . pantoprazole  40 mg Oral Daily  . PARoxetine  40 mg Oral QHS  . polycarbophil  1,250 mg Oral QHS  . rosuvastatin  5 mg Oral Daily   Continuous Infusions: . sodium chloride Stopped (09/19/19 1536)  . cefTRIAXone (ROCEPHIN)  IV Stopped (09/20/19 1440)    Antimicrobials: Anti-infectives (From admission, onward)   Start     Dose/Rate Route Frequency Ordered Stop   09/19/19 1400  cefTRIAXone (ROCEPHIN) 1 g in sodium chloride 0.9 % 100 mL IVPB     Discontinue     1 g 200 mL/hr over 30 Minutes Intravenous Every 24 hours 09/19/19 1219     09/17/19 1000  doxycycline (VIBRA-TABS) tablet 100 mg     Discontinue     100 mg Oral Every 12 hours 09/17/19 0814 09/22/19 0959       Objective: Vitals: Today's Vitals   09/21/19 0308 09/21/19 0654 09/21/19 0759 09/21/19 0829  BP: 133/72 133/76    Pulse: 67 64 64   Resp: 20 18    Temp: 97.9 F (36.6 C) 98.1 F (36.7 C)    TempSrc: Oral Oral    SpO2: 93% 91% 92% 95%  Weight:      Height:      PainSc:        Intake/Output  Summary (Last 24 hours) at 09/21/2019 0855 Last data filed at 09/21/2019 1517 Gross per 24 hour  Intake 550 ml  Output 675 ml  Net -125 ml   Filed Weights   09/17/19 0102  Weight: 97.6 kg   Weight change:    Intake/Output from previous day: 07/11 0701 - 07/12 0700 In: 550 [P.O.:550] Out: 925 [Urine:925] Intake/Output this shift: No intake/output data recorded.  Examination:  General exam: AAOx3,NAD,weak appearing. HEENT:Oral mucosa moist, Ear/Nose WNL grossly, dentition normal. Respiratory system: bilaterally wheezes on psot lung on expiration, crackles on Rt lower base,no wheezing or crackles,no use of accessory muscle Cardiovascular system: S1 & S2 +,  No JVD,. Gastrointestinal system: Abdomen soft, NT,ND, BS+ Nervous System:Alert, awake, moving extremities and grossly nonfocal Extremities: No edema, distal peripheral pulses palpable.  Skin: No rashes,no icterus. MSK: Normal muscle bulk,tone, power  Data Reviewed: I have personally reviewed following labs and imaging studies CBC: Recent Labs  Lab 09/15/19 1215 09/17/19 0111 09/18/19 0946 09/19/19 0844 09/20/19 0249  WBC 18.9* 26.1* 32.3*  --  27.5*  NEUTROABS 12.2* 18.0*  --   --  23.0*  HGB 11.4* 11.4* 11.5* 12.6* 10.1*  HCT 35.0* 35.1* 36.0* 37.0* 31.4*  MCV 96.2 97.8 100.0  --  99.4  PLT 64* 110* 186  --  161   Basic Metabolic Panel: Recent Labs  Lab 09/15/19 1215 09/17/19 0111 09/18/19 0946 09/19/19 0844 09/20/19 0249  NA 137 136 135 139 136  K 4.1 4.1 4.4 4.6 5.0  CL 102 102 99  --  98  CO2 25 23 24   --  29  GLUCOSE 137* 127* 194*  --  174*  BUN 10 8 19   --  29*  CREATININE 0.95 0.90 1.15  --  1.08  CALCIUM 8.9 8.6* 8.8*  --  8.6*   GFR: Estimated Creatinine Clearance: 65 mL/min (by C-G formula based on SCr of 1.08 mg/dL). Liver Function Tests: Recent Labs  Lab 09/15/19 1215 09/17/19 0111  AST 35 58*  ALT 52* 66*  ALKPHOS 210* 219*  BILITOT 0.5 0.8  PROT 6.2* 5.9*  ALBUMIN 3.2* 3.0*    No results for input(s): LIPASE, AMYLASE in the last 168 hours. No results for input(s): AMMONIA in the last 168 hours. Coagulation Profile: No results for input(s): INR, PROTIME in the last 168 hours. Cardiac Enzymes: No results for input(s): CKTOTAL, CKMB, CKMBINDEX, TROPONINI in the last 168 hours. BNP (last 3 results) No results for input(s): PROBNP in the last 8760 hours. HbA1C: No results for input(s): HGBA1C in the last 72 hours. CBG: Recent Labs  Lab 09/20/19 2144 09/21/19 0758  GLUCAP 188* 157*   Lipid Profile: No results for input(s): CHOL, HDL, LDLCALC, TRIG, CHOLHDL, LDLDIRECT in the last 72 hours. Thyroid Function Tests: No results for input(s): TSH, T4TOTAL, FREET4, T3FREE, THYROIDAB in the last 72 hours. Anemia Panel: No results for input(s): VITAMINB12, FOLATE, FERRITIN, TIBC, IRON, RETICCTPCT in the last 72 hours. Sepsis Labs: Recent Labs  Lab 09/19/19 0920 09/20/19 0249 09/21/19 0558  PROCALCITON 0.16 0.78 0.39    Recent Results (from the past 240 hour(s))  SARS Coronavirus 2 by RT PCR (hospital order, performed in [facility] hospital lab) Nasopharyngeal Nasopharyngeal Swab     Status: None   Collection Time: 09/17/19  2:48 AM   Specimen: Nasopharyngeal Swab  Result Value Ref Range Status   SARS Coronavirus 2 NEGATIVE NEGATIVE Final    Comment: (NOTE) SARS-CoV-2 target nucleic acids are NOT DETECTED.  The SARS-CoV-2 RNA is generally detectable in upper and lower respiratory specimens during the acute phase of infection. The lowest concentration of SARS-CoV-2 viral copies this assay can detect is 250 copies / mL. A negative result does not preclude SARS-CoV-2 infection and should not be used as the sole basis for treatment or other patient management decisions.  A negative result may occur with improper specimen collection / handling, submission of specimen other than nasopharyngeal swab, presence of viral mutation(s) within the areas targeted by  this assay, and inadequate number of viral copies (<250 copies / mL). A negative result must be combined with clinical observations, patient history, and epidemiological information.  Fact  Sheet for Patients:   StrictlyIdeas.no  Fact Sheet for Healthcare Providers: BankingDealers.co.za  This test is not yet approved or  cleared by the [place] FDA and has been authorized for detection and/or diagnosis of SARS-CoV-2 by FDA under an Emergency Use Authorization (EUA).  This EUA will remain in effect (meaning this test can be used) for the duration of the COVID-19 declaration under Section 564(b)(1) of the Act, 21 U.S.C. section 360bbb-3(b)(1), unless the authorization is terminated or revoked sooner.  Performed at [facility], [place] [address] [place], [place] [address]   MRSA PCR Screening     Status: None   Collection Time: 09/19/19 10:00 AM   Specimen: Nasal Mucosa; Nasopharyngeal  Result Value Ref Range Status   MRSA by PCR NEGATIVE NEGATIVE Final    Comment:        The GeneXpert MRSA Assay (FDA approved for NASAL specimens only), is one component of a comprehensive MRSA colonization surveillance program. It is not intended to diagnose MRSA infection nor to guide or monitor treatment for MRSA infections. Performed at [facility], [place] [address], [place], [place] [address]   Urine Culture     Status: None   Collection Time: 09/19/19 12:20 PM   Specimen: Urine, Catheterized  Result Value Ref Range Status   Specimen Description   Final    URINE, CATHETERIZED Performed at [place] [address], [place], [place] [address]    Special Requests   Final    NONE Performed at [facility], [place] [address], [place], [place] [address]    Culture   Final    NO GROWTH Performed at [facility], [place] [address] [place], [place] [address]    Report  Status 09/20/2019 FINAL  Final  Culture, blood (routine x 2)     Status: None (Preliminary result)   Collection Time: 09/19/19 12:44 PM   Specimen: BLOOD  Result Value Ref Range Status   Specimen Description   Final    BLOOD LEFT ANTECUBITAL Performed at [facility], [place] [address], [place], [place] [address]    Special Requests   Final    BOTTLES DRAWN AEROBIC ONLY Blood Culture results may not be optimal due to an inadequate volume of blood received in culture bottles Performed at [place] [address] [place], [place] [address]    Culture   Final    NO GROWTH < 24 HOURS Performed at [place] [address] [place], [place] [address]    Report Status PENDING  Incomplete  Culture, blood (routine x 2)     Status: None (Preliminary result)   Collection Time: 09/19/19  1:31 PM   Specimen: BLOOD  Result Value Ref Range Status   Specimen Description   Final    BLOOD LEFT ANTECUBITAL Performed at [facility], [place] [address], [place], [place] [address]    Special Requests   Final    BOTTLES DRAWN AEROBIC AND ANAEROBIC Blood Culture adequate volume Performed at [place] [address] [place], [place] [address]    Culture   Final    NO GROWTH < 24 HOURS Performed at [place] [address], [place],  [address]    Report Status PENDING  Incomplete      Radiology Studies: DG Chest Port 1 View  Result Date: 09/20/2019 CLINICAL DATA:  Respiratory failure EXAM: PORTABLE CHEST  1 VIEW COMPARISON:  September 19, 2019 FINDINGS: There is atelectatic change in the right mid lung and right base regions. Lungs elsewhere are clear. Heart is borderline prominent, stable, with pulmonary vascularity normal. No adenopathy. Status post total shoulder replacements bilaterally. IMPRESSION: Atelectatic changes in the right mid lung and right base regions, essentially stable. Lungs otherwise clear. Stable cardiac  prominence. Electronically Signed   By: Lowella Grip III M.D.   On: 09/20/2019 07:53   DG Chest Port 1 View  Result Date: 09/19/2019 CLINICAL DATA:  Oxygen desaturation and high BP EXAM: PORTABLE CHEST 1 VIEW COMPARISON:  Chest x-rays dated 09/17/2019 and 04/13/2019. FINDINGS: Study is hypoinspiratory with crowding of the bilateral perihilar and bibasilar bronchovascular markings. Probable associated atelectasis at the LEFT lung base. Lungs otherwise clear. No pleural effusion or pneumothorax is seen. Heart size and mediastinal contours appear stable. IMPRESSION: Low lung volumes. Probable associated atelectasis at the LEFT lung base. No evidence of pneumonia or pulmonary edema. Electronically Signed   By: Franki Cabot M.D.   On: 09/19/2019 09:56   ECHOCARDIOGRAM COMPLETE  Result Date: 09/19/2019    ECHOCARDIOGRAM REPORT   Patient Name:   ARSEN MANGIONE Eblen Date of Exam: 09/19/2019 Medical Rec #:  [id number]        Height:       69.0 in Accession #:    [id number]       Weight:       215.1 lb Date of Birth:  [birth year]       BSA:          2.131 m Patient Age:    40 years         BP:           88/50 mmHg Patient Gender: M                HR:           73 bpm. Exam Location:  Inpatient Procedure: 2D Echo Indications:    Dyspnea; CHF  History:        Patient has prior history of Echocardiogram examinations, most                 recent 02/14/2019. CAD, Arrythmias:Atrial Fibrillation; Risk                 Factors:Hypertension.  Sonographer:    Mikki Santee RDCS (AE) Referring Phys: [id number] [place] IMPRESSIONS  1. Left ventricular ejection fraction, by estimation, is 60 to 65%. The left ventricle has normal function. The left ventricle has no regional wall motion abnormalities. Left ventricular diastolic parameters were normal.  2. Right ventricular systolic function is moderately reduced. The right ventricular size is moderately enlarged. There is moderately elevated pulmonary artery systolic pressure.  3.  Left atrial size was mildly dilated.  4. The mitral valve is normal in structure. Mild mitral valve regurgitation. No evidence of mitral stenosis.  5. The aortic valve is tricuspid. Aortic valve regurgitation is not visualized. Mild aortic valve sclerosis is present, with no evidence of aortic valve stenosis.  6. The inferior vena cava is normal in size with greater than 50% respiratory variability, suggesting right atrial pressure of 3 mmHg. FINDINGS  Left Ventricle: Left ventricular ejection fraction, by estimation, is 60 to 65%. The left ventricle has normal function. The left ventricle has no regional wall motion abnormalities. The left ventricular internal cavity size was normal in size. There is  no left ventricular hypertrophy. Left ventricular diastolic parameters were  normal. Right Ventricle: The right ventricular size is moderately enlarged. No increase in right ventricular wall thickness. Right ventricular systolic function is moderately reduced. There is moderately elevated pulmonary artery systolic pressure. The tricuspid  regurgitant velocity is 3.18 m/s, and with an assumed right atrial pressure of 8 mmHg, the estimated right ventricular systolic pressure is 24.5 mmHg. Left Atrium: Left atrial size was mildly dilated. Right Atrium: Right atrial size was normal in size. Pericardium: Trivial pericardial effusion is present. Mitral Valve: The mitral valve is normal in structure. There is mild thickening of the mitral valve leaflet(s). There is mild calcification of the mitral valve leaflet(s). Normal mobility of the mitral valve leaflets. Mild mitral annular calcification. Mild mitral valve regurgitation. No evidence of mitral valve stenosis. Tricuspid Valve: The tricuspid valve is normal in structure. Tricuspid valve regurgitation is mild . No evidence of tricuspid stenosis. Aortic Valve: The aortic valve is tricuspid. Aortic valve regurgitation is not visualized. Mild aortic valve sclerosis is present,  with no evidence of aortic valve stenosis. Pulmonic Valve: The pulmonic valve was normal in structure. Pulmonic valve regurgitation is not visualized. No evidence of pulmonic stenosis. Aorta: The aortic root is normal in size and structure. Venous: The inferior vena cava is normal in size with greater than 50% respiratory variability, suggesting right atrial pressure of 3 mmHg. IAS/Shunts: No atrial level shunt detected by color flow Doppler.  LEFT VENTRICLE PLAX 2D LVIDd:         5.20 cm  Diastology LVIDs:         3.20 cm  LV e' lateral:   9.46 cm/s LV PW:         1.10 cm  LV E/e' lateral: 10.3 LV IVS:        1.10 cm  LV e' medial:    6.42 cm/s LVOT diam:     2.30 cm  LV E/e' medial:  15.2 LV SV:         75 LV SV Index:   35 LVOT Area:     4.15 cm  RIGHT VENTRICLE RV S prime:     12.20 cm/s TAPSE (M-mode): 0.9 cm LEFT ATRIUM             Index       RIGHT ATRIUM           Index LA diam:        4.20 cm 1.97 cm/m  RA Area:     16.80 cm LA Vol (A2C):   47.9 ml 22.48 ml/m RA Volume:   46.30 ml  21.73 ml/m LA Vol (A4C):   56.5 ml 26.52 ml/m LA Biplane Vol: 56.1 ml 26.33 ml/m  AORTIC VALVE LVOT Vmax:   101.00 cm/s LVOT Vmean:  73.500 cm/s LVOT VTI:    0.180 m  AORTA Ao Root diam: 3.00 cm MITRAL VALVE               TRICUSPID VALVE MV Area (PHT): 3.42 cm    TR Peak grad:   40.4 mmHg MV Decel Time: 222 msec    TR Vmax:        318.00 cm/s MV E velocity: 97.90 cm/s MV A velocity: 96.10 cm/s  SHUNTS MV E/A ratio:  1.02        Systemic VTI:  0.18 m                            Systemic Diam: 2.30 cm Jenkins Rouge  MD Electronically signed by Jenkins Rouge MD Signature Date/Time: 09/19/2019/3:02:35 PM    Final      LOS: 3 days   Antonieta Pert, MD [facility]  09/21/2019, 8:55 AM

## 2019-09-21 NOTE — TOC Progression Note (Signed)
Transition of Care [facility]) - Progression Note    Patient Details  Name: George Vega MRN: [id number] Date of Birth: [birth year]  [facility] CM/SW Contact  Brystal Kildow, Juliann Pulse, RN Phone Number: 09/21/2019, 3:29 PM  Clinical Narrative: [place] following;[employer] rep Thedore Mins following for Home 02 if sats qualify, & ordered.      Expected Discharge Plan: [place] Barriers to Discharge: No Barriers Identified  Expected Discharge Plan and Services Expected Discharge Plan: [place]                                               Social Determinants of Health (SDOH) Interventions    Readmission Risk Interventions No flowsheet data found.

## 2019-09-21 NOTE — Progress Notes (Signed)
Physical Therapy Treatment Patient Details Name: George Vega MRN: [id number] DOB: 1941/01/07 Today's Date: 09/21/2019    History of Present Illness 79 y.o. male with medical history significant of CVA; stage 1 RLL SCC; HTN; CAD; afib; and CHF presenting with SOB.  He has been having trouble.  He started chemo 2 1/2 weeks ago.  Since then, he has been having problems with his breathing, more pronounced.  It was intermittent.  A few days ago, he started with panting, wheezing.  Difficulty sleeping and staying awake.  It got worse last night and they had to come in.  +audible wheezing.  + cough.  He does not wear home O2 but they think he needs it.  Admitted with resp failure.    PT Comments    Progressing with mobility. Dyspnea 2/4 with 1 standing rest break needed. O2 87% on RA. Pt is concerned he may need O2 at discharge. Encouraged him to discuss this with MD. Recommend nursing check ambulatory O2 sats again prior to d/c.     Follow Up Recommendations  Home health PT;Supervision/Assistance - 24 hour     Equipment Recommendations       Recommendations for Other Services       Precautions / Restrictions Precautions Precautions: Fall Restrictions Weight Bearing Restrictions: No    Mobility  Bed Mobility Overal bed mobility: Modified Independent             General bed mobility comments: increased time and required use of bedrails  Transfers Overall transfer level: Needs assistance Equipment used: Rolling walker (2 wheeled) Transfers: Sit to/from Stand Sit to Stand: Supervision         General transfer comment: cues for safe hand placement  Ambulation/Gait Ambulation/Gait assistance: Min guard Gait Distance (Feet): 225 Feet Assistive device: Rolling walker (2 wheeled) Gait Pattern/deviations: Step-through pattern;Decreased stride length     General Gait Details: required 1 standing rest break. DOE of 2/4 with activity. O2 87% on RA   Stairs              Wheelchair Mobility    Modified Rankin (Stroke Patients Only)       Balance                                            Cognition Arousal/Alertness: Awake/alert Behavior During Therapy: WFL for tasks assessed/performed Overall Cognitive Status: Within Functional Limits for tasks assessed                                        Exercises      General Comments        Pertinent Vitals/Pain Pain Assessment: No/denies pain    Home Living                      Prior Function            PT Goals (current goals can now be found in the care plan section) Progress towards PT goals: Progressing toward goals    Frequency    Min 3X/week      PT Plan Current plan remains appropriate    Co-evaluation              AM-PAC PT "6 Clicks" Mobility   Outcome Measure  Help needed turning from your back to your side while in a flat bed without using bedrails?: None Help needed moving from lying on your back to sitting on the side of a flat bed without using bedrails?: None Help needed moving to and from a bed to a chair (including a wheelchair)?: None Help needed standing up from a chair using your arms (e.g., wheelchair or bedside chair)?: A Little Help needed to walk in hospital room?: A Little Help needed climbing 3-5 steps with a railing? : A Little 6 Click Score: 21    End of Session Equipment Utilized During Treatment: Gait belt Activity Tolerance: Patient tolerated treatment well Patient left: in bed;with call bell/phone within reach;with bed alarm set   PT Visit Diagnosis: Difficulty in walking, not elsewhere classified (R26.2)     Time: 3073-5430 PT Time Calculation (min) (ACUTE ONLY): 16 min  Charges:  $Gait Training: 8-22 mins                         Doreatha Massed, PT Acute Rehabilitation  Office: [phone] Pager: [phone]

## 2019-09-22 ENCOUNTER — Inpatient Hospital Stay: Payer: Medicare Other

## 2019-09-22 ENCOUNTER — Ambulatory Visit: Payer: Medicare Other | Admitting: Cardiology

## 2019-09-22 LAB — BASIC METABOLIC PANEL
Anion gap: 12 (ref 5–15)
BUN: 34 mg/dL — ABNORMAL HIGH (ref 8–23)
CO2: 25 mmol/L (ref 22–32)
Calcium: 8.5 mg/dL — ABNORMAL LOW (ref 8.9–10.3)
Chloride: 103 mmol/L (ref 98–111)
Creatinine, Ser: 1.01 mg/dL (ref 0.61–1.24)
GFR calc Af Amer: >60 mL/min (ref >60)
GFR calc non Af Amer: >60 mL/min (ref >60)
Glucose, Bld: 171 mg/dL — ABNORMAL HIGH (ref 70–99)
Potassium: 4.4 mmol/L (ref 3.5–5.1)
Sodium: 140 mmol/L (ref 135–145)

## 2019-09-22 LAB — CBC
HCT: 31.3 % — ABNORMAL LOW (ref 39.0–52.0)
Hemoglobin: 10.3 g/dL — ABNORMAL LOW (ref 13.0–17.0)
MCH: 32.1 pg (ref 26.0–34.0)
MCHC: 32.9 g/dL (ref 30.0–36.0)
MCV: 97.5 fL (ref 80.0–100.0)
Platelets: 292 10*3/uL (ref 150–400)
RBC: 3.21 MIL/uL — ABNORMAL LOW (ref 4.22–5.81)
RDW: 14.6 % (ref 11.5–15.5)
WBC: 32.8 10*3/uL — ABNORMAL HIGH (ref 4.0–10.5)
nRBC: 0.1 % (ref 0.0–0.2)

## 2019-09-22 MED ORDER — METHYLPREDNISOLONE SODIUM SUCC 40 MG IJ SOLR
40.0000 mg | Freq: Every day | INTRAMUSCULAR | Status: DC
  Administered 2019-09-22: 40 mg via INTRAVENOUS
  Filled 2019-09-22 (×2): qty 1

## 2019-09-22 NOTE — Progress Notes (Signed)
PALLIATIVE NOTE:  Referral received for goals of care discussion/code status. Chart reviewed. Updates received.   Patient resting on presentation. Wife, Mardene Celeste at the bedside. Introduced myself and Palliative Medicine's role in George Vega care while hospitalized. Wife verbalized understanding and appreciation.   Due to George Vega resting wife would like to have discussion at a later time. At wife's request for later visit, meeting set for tomorrow 09/23/19 @ 12pm. Wife reports patient may discharge home tomorrow. She is aware I will plan to meet with both she and patient if he remains hospitalized. Wife verbalized understanding.   Detailed note and recommendation to follow completed [place] meeting. Dr. Lupita Leash updated.   Alda Lea, AGPCNP-BC Palliative Medicine Team  Phone: [phone] Pager: [phone] Amion: Bjorn Pippin   No Charge

## 2019-09-22 NOTE — Plan of Care (Signed)

## 2019-09-22 NOTE — Progress Notes (Signed)
PROGRESS NOTE    George Vega  VZD:[id number] DOB: 09/04/1940 DOA: 09/17/2019 PCP: [name], MD   Chef Complaints: Shortness of breath  Brief Narrative: As per Dr. Lorin Mercy" 79 y.o. male with medical history significant of CVA; stage 1 RLL SCC; HTN; CAD; afib; and CHF presenting with SOB.  He has been having trouble. He started chemo 2 1/2 weeks ago. Since then, he has been having problems with his breathing, more pronounced. It was intermittent. A few days ago, he started with panting, wheezing. Difficulty sleeping and staying awake. It got worse last night and they had to come in. +audible wheezing. + cough. He does not wear home O2 but they think he needs it. His pulse ox has been in the 90s at home. +LE edema, L >R, intermittently. No obvious orthopnea. +PND. Panting with minimal exertion. No fevers. Mild rhinorrhea x 2 days." ED CT negative for PE BNP elevated in 500 patient hypoxic in the 80s given IV Lasix, also had leukocytosis could be from recent Neulasta, patient was admitted for further management.  09/18/09 Acutely short of breath this am with wheezing, resp distress- sent ot ICU on bipap, steroid iv and s/pp iv lasix.  Subjective: Seen this morning some shortness of breath.  Needing oxygen. Hypoxic and short of breath with pt yesterday, at home he would be dyspneic with activity per daughter at bedside No chest pain, no fever.  Assessment & Plan:  Acute on chronic hypoxic respiratory failure/Acute resp distress suspecting acute COPD exacerbation with significant wheezing also contributed by his malignant/metastatic lung cancer deconditioning: Patient was sent to stepdown on BiPAP on 7/10, stabilized and was transferred out.  Hypoxia and dyspnea with activity.  Patient has had dyspnea at home with activity as per the daughter.  Continues to have wheezing we will transition IV Solu-Medrol to once a day regimen, will need to see if he can tolerate p.o. also.  Cont   bronchodilators empiric ceftriaxone doxycycline.  Has not had any recurrence of fever. cont PT/OT check for ambulatory pulse ox. CXR 7/11  " Atelectatic changes in the right mid lung and right base regions, essentially stable."  Acute decompensated diastolic CHF, with BNP in 500 on admission.  Patient needed iv lasix. Patient echocardiogram was reviewed echocardiogram relatively unremarkable. euvolemic and stable  New fever in ICU, unclear infection/source/could be secondary to atelectasis.  No Pneumonia so far on the chest x-ray.  Pneumonia- blood cultures negative so far.  Continue ceftriaxone doxycycline.  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 Julien Nordmann.  Patient would like to look into stopping chemo/treatment and he is going to follow-up with Dr. Inda Merlin and discuss in the clinic.Palliative care consulted to discuss [place].  Leukocytosis likely from Neulasta end of jun 28, steroid, possible infection.  WBC count count bumping up at 32.8.  Monitor.  No recurrence of fever.  Blood culture no growth so far. CT on admission no acute finding.Marland Kitchen Recent Labs  Lab 09/15/19 1215 09/17/19 0111 09/18/19 0946 09/20/19 0249 09/22/19 0605  WBC 18.9* 26.1* 32.3* 27.5* 32.8*   Hyperlipemia: Continue statin  HTN: BP stable on metoprolol.  PAF rate controlled on amiodarone metoprolol and anticoagulation with  Eliquis   Obesity (BMI 30.0-34.9): Will benefit with weight loss and healthy lifestyle.  DVT prophylaxis:ELIQUIS Code Status: full Family Communication: plan of care discussed with patient at bedside.  Discussed with patient's daughter at the  bedside.  Status is: inpatient Patient remains hospitalized for ongoing management of his respiratory failure wheezing.    Dispo: The patient is from: Home              Anticipated d/c is to: Home              Anticipated d/c date is: 1-2 day              Patient currently is not medically stable to d/c.  Plan on discharge home with hh once respiratory status is stable.  Nutrition: Diet Order            Diet Heart Room service appropriate? Yes; Fluid consistency: Thin  Diet effective now                 Body mass index is 31.76 kg/m.  Consultants: PCCM. Procedures:see note Microbiology:see note  Medications: Scheduled Meds: . amiodarone  200 mg Oral Daily  . apixaban  5 mg Oral BID  . baclofen  10 mg Oral BID  . chlorhexidine  15 mL Mouth Rinse BID  . Chlorhexidine Gluconate Cloth  6 each Topical Daily  . feeding supplement (ENSURE ENLIVE)  237 mL Oral Q24H  . ferrous sulfate  325 mg Oral BID WC  . magnesium oxide  400 mg Oral BID  . mouth rinse  15 mL Mouth Rinse q12n4p  . methylPREDNISolone (SOLU-MEDROL) injection  40 mg Intravenous Q8H  . metoprolol tartrate  25 mg Oral BID  . multivitamin  1 tablet Oral Daily  . pantoprazole  40 mg Oral Daily  . PARoxetine  40 mg Oral QHS  . polycarbophil  1,250 mg Oral QHS  . rosuvastatin  5 mg Oral Daily   Continuous Infusions: . sodium chloride Stopped (09/19/19 1536)  . cefTRIAXone (ROCEPHIN)  IV 1 g (09/21/19 1342)    Antimicrobials: Anti-infectives (From admission, onward)   Start     Dose/Rate Route Frequency Ordered Stop   09/19/19 1400  cefTRIAXone (ROCEPHIN) 1 g in sodium chloride 0.9 % 100 mL IVPB     Discontinue     1 g 200 mL/hr over 30 Minutes Intravenous Every 24 hours 09/19/19 1219     09/17/19 1000  doxycycline (VIBRA-TABS) tablet 100 mg        100 mg Oral Every 12 hours 09/17/19 0814 09/22/19 0959       Objective: Vitals: Today's Vitals   09/21/19 2050 09/21/19 2134 09/22/19 0518 09/22/19 0830    BP:  134/77 (!) 144/80   Pulse:  68 60   Resp:  17 18   Temp:  98.3 F (36.8 C) 98 F (36.7 C)   TempSrc:  Oral Oral   SpO2: 95% 93% 97% 94%  Weight:      Height:      PainSc:        Intake/Output Summary (Last 24 hours) at 09/22/2019 1000 Last data filed at 09/22/2019 0500 Gross per 24 hour  Intake --  Output 225 ml  Net -225 ml   Filed Weights   09/17/19 0102  Weight: 97.6 kg   Weight change:    Intake/Output from previous day: 07/12 0701 - 07/13 0700 In: -  Out: 225 [Urine:225] Intake/Output this shift: No intake/output data recorded.  Examination:  General exam: AAOx3, weak,NAD, weak appearing. HEENT:Oral mucosa moist, Ear/Nose WNL grossly, dentition normal. Respiratory system: bilaterally wheezes on expiration,no use of accessory muscle Cardiovascular system: S1 & S2 +, No JVD,. Gastrointestinal system: Abdomen soft, NT,ND, BS+ Nervous System:Alert, awake, moving extremities and grossly nonfocal Extremities: No edema, distal peripheral pulses palpable.  Skin: No rashes,no icterus. MSK: Normal muscle bulk,tone, power  Data Reviewed: I have personally reviewed following labs and imaging studies CBC: Recent Labs  Lab 09/15/19 1215 09/15/19 1215 09/17/19 0111 09/18/19 0946 09/19/19 0844 09/20/19 0249 09/22/19 0605  WBC 18.9*  --  26.1* 32.3*  --  27.5* 32.8*  NEUTROABS 12.2*  --  18.0*  --   --  23.0*  --   HGB 11.4*  --  11.4* 11.5* 12.6* 10.1* 10.3*  HCT 35.0*   < > 35.1* 36.0* 37.0* 31.4* 31.3*  MCV 96.2  --  97.8 100.0  --  99.4 97.5  PLT 64*  --  110* 186  --  226 292   < > = values in this interval not displayed.   Basic Metabolic Panel: Recent Labs  Lab 09/15/19 1215 09/15/19 1215 09/17/19 0111 09/18/19 0946 09/19/19 0844 09/20/19 0249 09/22/19 0605  NA 137   < > 136 135 139 136 140  K 4.1   < > 4.1 4.4 4.6 5.0 4.4  CL 102  --  102 99  --  98 103  CO2 25  --  23 24  --  29 25  GLUCOSE 137*  --  127* 194*  --  174* 171*  BUN 10  --   8 19  --  29* 34*  CREATININE 0.95  --  0.90 1.15  --  1.08 1.01  CALCIUM 8.9  --  8.6* 8.8*  --  8.6* 8.5*   < > = values in this interval not displayed.   GFR: Estimated Creatinine Clearance: 69.5 mL/min (by C-G formula based on SCr of 1.01 mg/dL). Liver Function Tests: Recent Labs  Lab 09/15/19 1215 09/17/19 0111  AST 35 58*  ALT 52* 66*  ALKPHOS 210* 219*  BILITOT 0.5 0.8  PROT 6.2* 5.9*  ALBUMIN 3.2* 3.0*   No results for input(s): LIPASE, AMYLASE in the last 168 hours. No results for input(s): AMMONIA in the last 168 hours. Coagulation Profile: No results for input(s): INR, PROTIME in the last 168 hours. Cardiac Enzymes: No results for input(s): CKTOTAL, CKMB, CKMBINDEX, TROPONINI in the last 168 hours. BNP (last 3 results) No results for input(s): PROBNP in the last 8760 hours. HbA1C: No results for input(s): HGBA1C in the last 72 hours. CBG: Recent Labs  Lab 09/20/19 2144 09/21/19 0758 09/21/19 1222 09/21/19 1643  GLUCAP 188* 157* 237* 208*   Lipid Profile: No results for input(s): CHOL, HDL, LDLCALC, TRIG, CHOLHDL, LDLDIRECT in the last 72 hours. Thyroid Function Tests: No results for input(s): TSH, T4TOTAL, FREET4, T3FREE, THYROIDAB in the last 72 hours. Anemia Panel: No results for input(s): VITAMINB12, FOLATE, FERRITIN, TIBC, IRON, RETICCTPCT in the last 72 hours. Sepsis Labs: Recent Labs  Lab 09/19/19 0920 09/20/19 0249 09/21/19 0558  PROCALCITON 0.16 0.78 0.39    Recent Results (from the past 240 hour(s))  SARS Coronavirus 2 by RT PCR (hospital order, performed in [facility] hospital lab) Nasopharyngeal Nasopharyngeal Swab     Status: None   Collection Time: 09/17/19  2:48 AM   Specimen: Nasopharyngeal Swab  Result Value Ref Range Status   SARS Coronavirus 2 NEGATIVE NEGATIVE  Final    Comment: (NOTE) SARS-CoV-2 target nucleic acids are NOT DETECTED.  The SARS-CoV-2 RNA is generally detectable in upper and lower respiratory specimens  during the acute phase of infection. The lowest concentration of SARS-CoV-2 viral copies this assay can detect is 250 copies / mL. A negative result does not preclude SARS-CoV-2 infection and should not be used as the sole basis for treatment or other patient management decisions.  A negative result may occur with improper specimen collection / handling, submission of specimen other than nasopharyngeal swab, presence of viral mutation(s) within the areas targeted by this assay, and inadequate number of viral copies (<250 copies / mL). A negative result must be combined with clinical observations, patient history, and epidemiological information.  Fact Sheet for Patients:   StrictlyIdeas.no  Fact Sheet for Healthcare Providers: BankingDealers.co.za  This test is not yet approved or  cleared by the [place] FDA and has been authorized for detection and/or diagnosis of SARS-CoV-2 by FDA under an Emergency Use Authorization (EUA).  This EUA will remain in effect (meaning this test can be used) for the duration of the COVID-19 declaration under Section 564(b)(1) of the Act, 21 U.S.C. section 360bbb-3(b)(1), unless the authorization is terminated or revoked sooner.  Performed at [facility], [place] [address] [place], [place] [address]   MRSA PCR Screening     Status: None   Collection Time: 09/19/19 10:00 AM   Specimen: Nasal Mucosa; Nasopharyngeal  Result Value Ref Range Status   MRSA by PCR NEGATIVE NEGATIVE Final    Comment:        The GeneXpert MRSA Assay (FDA approved for NASAL specimens only), is one component of a comprehensive MRSA colonization surveillance program. It is not intended to diagnose MRSA infection nor to guide or monitor treatment for MRSA infections. Performed at [facility], [place] [address] [place], [place] [address]   Urine Culture     Status: None   Collection Time: 09/19/19  12:20 PM   Specimen: Urine, Catheterized  Result Value Ref Range Status   Specimen Description   Final    URINE, CATHETERIZED Performed at [facility] [address] [place], [place] [address]    Special Requests   Final    NONE Performed at [facility], [place] [address] [place], [place] [address]    Culture   Final    NO GROWTH Performed at [facility], [place] [address], [place], [place] [address]    Report Status 09/20/2019 FINAL  Final  Culture, blood (routine x 2)     Status: None (Preliminary result)   Collection Time: 09/19/19 12:44 PM   Specimen: BLOOD  Result Value Ref Range Status   Specimen Description   Final    BLOOD LEFT ANTECUBITAL Performed at [facility], [place] [address], [place], [place] [address]    Special Requests   Final    BOTTLES DRAWN AEROBIC ONLY Blood Culture results may not be optimal due to an inadequate volume of blood received in culture bottles Performed at [facility] [address], [place], [place] [address]    Culture   Final    NO GROWTH 2 DAYS Performed at [facility] [address], [place], [place] [address]    Report Status PENDING  Incomplete  Culture, blood (routine x 2)     Status: None (Preliminary result)   Collection Time: 09/19/19  1:31 PM  Specimen: BLOOD  Result Value Ref Range Status   Specimen Description   Final    BLOOD LEFT ANTECUBITAL Performed at [facility], [place] [address] [place], [place] [address]    Special Requests   Final    BOTTLES DRAWN AEROBIC AND ANAEROBIC Blood Culture adequate volume Performed at [facility] [address], [place],  [address]    Culture   Final    NO GROWTH 2 DAYS Performed at [facility] [address] [place],  [address]    Report Status PENDING  Incomplete      Radiology Studies: No results found.   LOS: 4 days   Antonieta Pert, MD [facility]  09/22/2019, 10:00 AM

## 2019-09-22 NOTE — Progress Notes (Signed)
SATURATION QUALIFICATIONS: (This note is used to comply with regulatory documentation for home oxygen)  Patient Saturations on Room Air at Rest = 92%  Patient Saturations on Room Air while Ambulating = 85%  Patient Saturations on 3 Liters of oxygen while Ambulating = 95%  Please briefly explain why patient needs home oxygen: Respiratory failure with hypoxia/COPD

## 2019-09-23 ENCOUNTER — Inpatient Hospital Stay: Payer: Medicare Other

## 2019-09-23 ENCOUNTER — Inpatient Hospital Stay (HOSPITAL_COMMUNITY): Payer: Medicare Other

## 2019-09-23 DIAGNOSIS — Z7189 Other specified counseling: Secondary | ICD-10-CM

## 2019-09-23 DIAGNOSIS — Z515 Encounter for palliative care: Secondary | ICD-10-CM

## 2019-09-23 DIAGNOSIS — J441 Chronic obstructive pulmonary disease with (acute) exacerbation: Secondary | ICD-10-CM

## 2019-09-23 DIAGNOSIS — Z66 Do not resuscitate: Secondary | ICD-10-CM

## 2019-09-23 DIAGNOSIS — I480 Paroxysmal atrial fibrillation: Secondary | ICD-10-CM

## 2019-09-23 DIAGNOSIS — C3411 Malignant neoplasm of upper lobe, right bronchus or lung: Secondary | ICD-10-CM

## 2019-09-23 DIAGNOSIS — J9691 Respiratory failure, unspecified with hypoxia: Secondary | ICD-10-CM

## 2019-09-23 LAB — CBC
HCT: 32.3 % — ABNORMAL LOW (ref 39.0–52.0)
Hemoglobin: 10.3 g/dL — ABNORMAL LOW (ref 13.0–17.0)
MCH: 31.9 pg (ref 26.0–34.0)
MCHC: 31.9 g/dL (ref 30.0–36.0)
MCV: 100 fL (ref 80.0–100.0)
Platelets: 307 10*3/uL (ref 150–400)
RBC: 3.23 MIL/uL — ABNORMAL LOW (ref 4.22–5.81)
RDW: 14.7 % (ref 11.5–15.5)
WBC: 27.1 10*3/uL — ABNORMAL HIGH (ref 4.0–10.5)
nRBC: 0.1 % (ref 0.0–0.2)

## 2019-09-23 LAB — BASIC METABOLIC PANEL
Anion gap: 7 (ref 5–15)
BUN: 33 mg/dL — ABNORMAL HIGH (ref 8–23)
CO2: 28 mmol/L (ref 22–32)
Calcium: 8.5 mg/dL — ABNORMAL LOW (ref 8.9–10.3)
Chloride: 104 mmol/L (ref 98–111)
Creatinine, Ser: 0.98 mg/dL (ref 0.61–1.24)
GFR calc Af Amer: >60 mL/min (ref >60)
GFR calc non Af Amer: >60 mL/min (ref >60)
Glucose, Bld: 162 mg/dL — ABNORMAL HIGH (ref 70–99)
Potassium: 4.3 mmol/L (ref 3.5–5.1)
Sodium: 139 mmol/L (ref 135–145)

## 2019-09-23 MED ORDER — PREDNISONE 20 MG PO TABS
40.0000 mg | ORAL_TABLET | Freq: Every day | ORAL | Status: DC
  Administered 2019-09-23: 40 mg via ORAL
  Filled 2019-09-23: qty 2

## 2019-09-23 MED ORDER — FUROSEMIDE 10 MG/ML IJ SOLN
20.0000 mg | Freq: Every day | INTRAMUSCULAR | Status: DC
  Administered 2019-09-23 – 2019-09-24 (×2): 20 mg via INTRAVENOUS
  Filled 2019-09-23 (×3): qty 2

## 2019-09-23 MED ORDER — METHYLPREDNISOLONE SODIUM SUCC 40 MG IJ SOLR
40.0000 mg | Freq: Every day | INTRAMUSCULAR | Status: DC
  Administered 2019-09-23: 40 mg via INTRAVENOUS
  Filled 2019-09-23: qty 1

## 2019-09-23 NOTE — Progress Notes (Signed)
PT Cancellation Note  Patient Details Name: LEVERETT CAMPLIN MRN: [id number] DOB: [birth year]   Cancelled Treatment:    Reason Eval/Treat Not Completed: Fatigue/lethargy limiting ability to participate (pt stated he's had a busy day and needs to rest and let his breathing settle. He would like to walk another time. Will follow.)  Philomena Doheny PT 09/23/2019  Acute Rehabilitation Services Pager [phone] Office [phone]

## 2019-09-23 NOTE — Progress Notes (Signed)
Nutrition Follow-up  DOCUMENTATION CODES:   Obesity unspecified  INTERVENTION:  - will d/c Ensure Enlive per patient request.   NUTRITION DIAGNOSIS:   Increased nutrient needs related to cancer and cancer related treatments as evidenced by estimated needs. -ongoing  GOAL:   Patient will meet greater than or equal to 90% of their needs -minimally met on average   MONITOR:   PO intake, Supplement acceptance, Labs, Weight trends, I & O's  ASSESSMENT:   79 y.o. male with medical history significant of CVA; stage 1 RLL SCC; HTN; CAD; afib; and CHF presenting with SOB.  He has been having trouble.  He started chemo 2 1/2 weeks ago.  RD is working on another campus today; unable to see patient in person. He has been eating 50-100% of meals over the past 5 days. He has refused all bottles of Ensure since order placed on 7/9 and does not wish to continue to be offered this supplement.  He has not been weighed since admission on 7/8.  Notes indicate that patient was originally diagnosed with stage 1 NSCLC but it is now being categorized as metastatic with poorly differentiated NSCLC with neuroendocrine features with mets to liver with 50% survival rate.   He is DNR. Plan for time of d/c is for home. Notes have indicated that during this hospitalization patient has expressed a desire to stop chemo treatments. Plan is for follow-up next week for ongoing discussion concerning this.    Labs reviewed; BUN: 33 mg/dl, Ca: 8.5 mg/dl. Medications reviewed; 325 mg ferrous sulfate BID, 400 mg mag-ox BID, 1 tablet prosight/day, 1250 mg fibercon/day, 40 mg deltasone/day.    NUTRITION - FOCUSED PHYSICAL EXAM:  unable to complete at this time.   Diet Order:   Diet Order            Diet Heart Room service appropriate? Yes; Fluid consistency: Thin  Diet effective now                 EDUCATION NEEDS:   No education needs have been identified at this time  Skin:  Skin Assessment: Reviewed  RN Assessment  Last BM:  7/12  Height:   Ht Readings from Last 1 Encounters:  09/17/19 5' 9"  (1.753 m)    Weight:   Wt Readings from Last 1 Encounters:  09/17/19 97.6 kg    Estimated Nutritional Needs:  Kcal:  2200-2400 Protein:  100-110g Fluid:  2L/day     Jarome Matin, MS, RD, LDN, CNSC Inpatient Clinical Dietitian RD pager # available in [facility]  After hours/weekend pager # available in [facility]

## 2019-09-23 NOTE — Progress Notes (Signed)
Occupational Therapy Treatment Patient Details Name: George Vega MRN: [id number] DOB: [birth year] Today's Date: 09/23/2019    History of present illness 79 y.o. male with medical history significant of CVA; stage 1 RLL SCC; HTN; CAD; afib; and CHF presenting with SOB.  He has been having trouble.  He started chemo 2 1/2 weeks ago.  Since then, he has been having problems with his breathing, more pronounced.  It was intermittent.  A few days ago, he started with panting, wheezing.  Difficulty sleeping and staying awake.  It got worse last night and they had to come in.  +audible wheezing.  + cough.  He does not wear home O2 but they think he needs it.  Admitted with resp failure.   OT comments  George Vega has demonstrated improving activity tolerance and functional mobility. Patient able to ambulate to the bathroom with RW, ambulate around bed and down the hall with RW - rest breaks in between. Patient's o2 sats monitored throughout - maintaining predominantly between 98-99% but never below 95% on RA today. However, patient reports persistent dyspnea despite o2 sat readings. With ambulation patient begins to speed up - when he begins to feel short of breath and required verbal cues for safety and instruction on breathing technique.    Follow Up Recommendations  No OT follow up    Equipment Recommendations  None recommended by OT    Recommendations for Other Services      Precautions / Restrictions Precautions Precautions: Fall Precaution Comments: monitor o2 sats       Mobility Bed Mobility Overal bed mobility: Modified Independent Bed Mobility: Supine to Sit     Supine to sit: HOB elevated;Supervision        Transfers   Equipment used: Rolling walker (2 wheeled) Transfers: Sit to/from Stand Sit to Stand:  (SBA)         General transfer comment: Patient ambulated in room to bathroom, around bed, and in hallway with Rw and SBA from therapist. O2 sats monitored  throughout. Patient's oxygen predominantly maintaining between 98-99% on RA after activity but patient with persistent feeling of dyspnea. Therapist instructed patient on breathing technique.    Balance Overall balance assessment: No apparent balance deficits (not formally assessed)                                         ADL either performed or assessed with clinical judgement   ADL                       Lower Body Dressing: Min guard;Set up Lower Body Dressing Details (indicate cue type and reason): Patient able to donn socks without physical assistance. min guard for safety.                     Vision       Perception     Praxis      Cognition Arousal/Alertness: Awake/alert Behavior During Therapy: WFL for tasks assessed/performed Overall Cognitive Status: Within Functional Limits for tasks assessed                                          Exercises     Shoulder Instructions       General Comments  Pertinent Vitals/ Pain       Pain Assessment: No/denies pain  Home Living                                          Prior Functioning/Environment              Frequency  Min 2X/week        Progress Toward Goals  OT Goals(current goals can now be found in the care plan section)  Progress towards OT goals: Progressing toward goals  Acute Rehab OT Goals Patient Stated Goal: to go home OT Goal Formulation: With patient Time For Goal Achievement: 10/02/19  Plan Discharge plan remains appropriate    Co-evaluation                 AM-PAC OT "6 Clicks" Daily Activity     Outcome Measure   Help from another person eating meals?: None Help from another person taking care of personal grooming?: None Help from another person toileting, which includes using toliet, bedpan, or urinal?: None Help from another person bathing (including washing, rinsing, drying)?: None Help from  another person to put on and taking off regular upper body clothing?: None Help from another person to put on and taking off regular lower body clothing?: None 6 Click Score: 24    End of Session Equipment Utilized During Treatment: Rolling walker;Gait belt  OT Visit Diagnosis: Unsteadiness on feet (R26.81);Dizziness and giddiness (R42)   Activity Tolerance Patient tolerated treatment well   Patient Left in chair;with call bell/phone within reach;with family/visitor present   Nurse Communication  (o2 sats)        Time: 6438-3818 OT Time Calculation (min): 23 min  Charges: OT General Charges $OT Visit: 1 Visit OT Treatments $Therapeutic Activity: 8-22 mins  Jodene Polyak, OTR/L [place]  Office [phone] Pager: [place] 09/23/2019, 4:04 PM

## 2019-09-23 NOTE — Consult Note (Signed)
Consultation Note Date: 09/23/2019   Patient Name: George Vega  DOB: 04-11-1940  MRN: [id number]  Age / Sex: 79 y.o., male   PCP: George Lass, MD Referring Physician: Antonieta Pert, MD   REASON FOR CONSULTATION:Establishing goals of care  Palliative Care consult requested for goals of care discussion in this 80 y.o. 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  Patient presented to ED with concerns of increased shortness of breath.  Patient reported worsening shortness of breath since initial chemotherapy treatment.  CT angio of the chest was negative for PE.  Patient hypoxic (80s).  Was started on IV Lasix and steroids.  BNP 500.  Patient initially required BiPAP support and was able to transition to nasal cannula.  Chest x-ray showed right lung atelectasis.  Clinical Assessment and Goals of Care: I have reviewed medical records including lab results, imaging, Epic notes, and MAR, received report from the bedside RN, and assessed the patient. I met at the bedside with patient and his George, George Vega to discuss diagnosis prognosis, George Vega, EOL wishes, disposition and options.  George Vega is awake, alert and oriented x3.  Denies pain.  Complains of shortness of breath with increased exertion.  I introduced Palliative Medicine as specialized medical care for people living with serious illness. It focuses on providing relief from the symptoms and stress of a serious illness. The goal is to improve quality of life for both the patient and the family.  Patient and George verbalized understanding and appreciation of our support.  We discussed a brief life review of the patient, along with his functional and nutritional status.  Patient and  George have been married for almost 82 years.  He refers to his George as his "everything".  They have 3 daughters and 8 grandchildren.  He has a dog named Sparky.  Patient is a retired Chief Financial Officer for [employer].  He served briefly in the [place] [employer].  He enjoys spending time with his family and friends.  He is of [place] faith.  Patient reports prior to initiation of chemotherapy he was able to perform all ADLs independently.  However since therapy he has been unable to do anything.  He reports decrease in appetite, increasing fatigue, increasing shortness of breath, and sleeping more than he is awake.  He denies use of oxygen at home.  Reports now using a Rollator 24/7 for gait stability due to frequent falls.  We discussed His current illness and what it means in the larger context of His on-going co-morbidities. With specific discussions regarding his metastatic cancer.  Natural disease trajectory and expectations at EOL were discussed.  George Vega and George verbalizes understanding of current illnesses and comorbidities.  He reports he has made a decision that he does not wish to pursue further chemotherapy unless his Oncologist is able to provide and additional treatment that gives him a significant change for the better and his quality of life.  George Vega spends time expressing what is most important to him with understanding that his quality of life, family, and how he lives the days/time that he has left matters the most.  He does not wish to continue with aggressive treatments such as chemotherapy if he will continue to further decline, require hospitalizations, and cannot simply enjoy his family.  Therapeutic listening and support provided.  George Vega her support in agreement with patient's decision.  Both patient and George reports they have discussed with her children who also agrees and supports patient to the fullest.  They are prepared for patient to return home and spend what time he has left  with family and friends if it is determined there are no further options available that we will provide him a better QOL.  I attempted to elicit values and goals of care important to the patient.    George Vega states he would like to meet with his Oncologist and speak with him personally explaining his decision and also allowing an opportunity see if there are any further treatment options outside of chemotherapy.  Support given.  Advanced directives, concepts specific to code status, artifical feeding and hydration, and rehospitalization were considered and discussed. Patient does have a documented advanced directive.  I discussed at length with patient regarding his current full CODE STATUS with consideration of his current illness, comorbidities, and expressed wishes regarding quality of life and spending what time he has left with family with awareness of poor prognosis.  Patient and George verbalized understanding.  Patient has expressed wishes for DNR/DNI.  Education provided on what DNR/DNI would look like for him as well as indication of completed out of facility form to be placed on chart and RN to place DNR bracelet on him identified his wishes to the medical team.  Patient and George verbalized understanding and appreciation.  Hospice and Palliative Care services outpatient were explained and offered. Patient and family verbalized their understanding and awareness of both palliative and hospice's goals and philosophy of care.  Patient requesting to discharge with outpatient palliative support initially until he is able to speak with George Vega.  Once he has had a chance to speak with him and declare his final decisions for normal further chemotherapy with understanding of no further available treatment options that would not further diminish his quality of life he would then prefer to transition his care to a more comfort/EOL focus including hospice care in the home.  Questions and concerns were  addressed.  Hard Choices booklet left for review. The family was encouraged to call with questions or concerns.  PMT will continue to support holistically.   SOCIAL HISTORY:     reports that he quit smoking about 21 years ago. His smoking use included cigarettes. He has a 60.00 pack-year smoking history. He has never used smokeless tobacco. He reports current alcohol use of about 14.0 standard drinks of alcohol per week. He reports that he does not use drugs.  CODE STATUS: DNR  ADVANCE DIRECTIVES: Irwin Brakeman (George/HC POA)  SYMPTOM MANAGEMENT: Per attending  Palliative Prophylaxis:   Bowel Regimen and Frequent Pain Assessment  PSYCHO-SOCIAL/SPIRITUAL:  Support System: Family  Desire for further Chaplaincy support: No  Additional Recommendations (Limitations, Scope, Preferences):  No Chemotherapy and Continue to treat the treatable while hospitalized with no escalation of care.  Educations on hospice/palliative    PAST MEDICAL HISTORY: Past Medical History:  Diagnosis Date  . Arthritis   . Atrial fibrillation, persistent ([place])    on Eliquis  . Cancer ([place])    liver  . CHF (congestive heart failure) ([place])   . Chronic sinusitis   . Coronary artery disease   . Depression   . Difficult intubation    was told with shoulder done 2006-alittle narrow  . 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 Hypertension   . Jejunostomy tube fell out  when asked about this in 04/2017, pt denied ever having a J tube, feeding tube, tubes placed post surgery so ??? veracity of a previous J tube.    . 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 Spastic colon   . 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 Stroke (cerebrum) ([place])   . SVT (supraventricular tachycardia) ([facility])     ALLERGIES:  is allergic to glucosamine-chondroitin, tetanus toxoid, fish oil, ibuprofen, naproxen, glucosamine-chondroitin, ibuprofen,  morphine and related, naproxen, and sulfa antibiotics.   MEDICATIONS:  Current Facility-Administered Medications  Medication Dose Route Frequency Provider Last Rate Last Admin  . 0.9 %  sodium chloride infusion  250 mL Intravenous Continuous Olalere, Adewale A, MD   Held at 09/19/19 1536  . acetaminophen (TYLENOL) suppository 650 mg  650 mg Rectal Q6H PRN Olalere, Adewale A, MD   650 mg at 09/19/19 1254   Or  . acetaminophen (TYLENOL) tablet 650 mg  650 mg Oral Q6H PRN Olalere, Adewale A, MD   650 mg at 09/21/19 1714  . albuterol (PROVENTIL) (2.5 MG/3ML) 0.083% nebulizer solution 2.5 mg  2.5 mg Nebulization Q2H PRN Karmen Bongo, MD      . amiodarone (PACERONE) tablet 200 mg  200 mg Oral Daily Karmen Bongo, MD   200 mg at 09/23/19 1010  . apixaban (ELIQUIS) tablet 5 mg  5 mg Oral BID Karmen Bongo, MD   5 mg at 09/23/19 1008  . baclofen (LIORESAL) tablet 10 mg  10 mg Oral BID Karmen Bongo, MD   10 mg at 09/23/19 1011  . cefTRIAXone (ROCEPHIN) 1 g in sodium chloride 0.9 % 100 mL IVPB  1 g Intravenous Q24H Olalere, Adewale A, MD 200 mL/hr at 09/23/19 1605 1 g at 09/23/19 1605  . chlorhexidine (PERIDEX) 0.12 % solution 15 mL  15 mL Mouth Rinse BID Olalere, Adewale A, MD   15 mL at 09/23/19 1008  . ferrous sulfate tablet 325 mg  325 mg Oral BID WC Karmen Bongo, MD   325 mg at 09/23/19 1009  . furosemide (LASIX) injection 20 mg  20 mg Intravenous Daily Kc, Ramesh, MD      . labetalol (NORMODYNE) injection 10 mg  10 mg Intravenous Q2H PRN Kc, Ramesh, MD      . LORazepam (ATIVAN) injection 0.5 mg  0.5 mg Intravenous Q4H PRN Karmen Bongo, MD   0.5 mg at 09/17/19 0852  . magnesium oxide (MAG-OX) tablet 400 mg  400 mg Oral BID Karmen Bongo, MD   400 mg at 09/23/19 1009  . meclizine (ANTIVERT) tablet 25 mg  25 mg Oral TID PRN Karmen Bongo, MD   25 mg at 09/21/19 0912  . MEDLINE mouth rinse  15 mL Mouth Rinse q12n4p Olalere, Adewale A, MD   15 mL at 09/23/19 1230  . melatonin tablet  10 mg  10 mg Oral QHS PRN Karmen Bongo, MD      . methylPREDNISolone sodium succinate (SOLU-MEDROL) 40 mg/mL injection 40 mg  40 mg Intravenous Daily Kc, Ramesh, MD   40 mg at 09/23/19 1602  . metoprolol tartrate (LOPRESSOR) tablet 25 mg  25 mg Oral BID Karmen Bongo, MD   25 mg at 09/23/19 1009  . multivitamin (PROSIGHT) tablet 1 tablet  1 tablet Oral Daily Karmen Bongo, MD   1 tablet at 09/23/19 1009  . oxyCODONE-acetaminophen (PERCOCET/ROXICET) 5-325 MG per tablet 1 tablet  1 tablet Oral Q6H PRN Karmen Bongo, MD      . pantoprazole (PROTONIX) EC tablet 40 mg  40 mg Oral Daily Karmen Bongo, MD   40 mg at 09/23/19 1011  . PARoxetine (PAXIL) tablet 40 mg  40 mg Oral Ivery Quale, MD   40 mg at 09/22/19 2118  . polycarbophil (FIBERCON) tablet 1,250 mg  1,250 mg Oral Ivery Quale, MD   1,250 mg at 09/22/19 2118  . polyvinyl alcohol (LIQUIFILM TEARS) 1.4 % ophthalmic solution 1 drop  1 drop Both Eyes BID PRN Karmen Bongo, MD      . rosuvastatin (CRESTOR) tablet 5 mg  5 mg Oral Daily Karmen Bongo, MD   5 mg at 09/23/19 1008  . senna-docusate (Senokot-S) tablet 1 tablet  1 tablet Oral QHS PRN Karmen Bongo, MD        VITAL SIGNS: BP (!) 146/85 (BP Location: Left Arm)   Pulse 60   Temp 98 F (36.7 C) (Oral)   Resp 20   Ht 5' 9"  (1.753 m)   Wt 97.6 kg   SpO2 98%   BMI 31.76 kg/m  Filed Weights   09/17/19 0102  Weight: 97.6 kg    Estimated body mass index is 31.76 kg/m as calculated from the following:   Height as of this encounter: 5' 9"  (1.753 m).   Weight as of this encounter: 97.6 kg.  LABS: CBC:    Component Value Date/Time   WBC 27.1 (H) 09/23/2019 0614   HGB 10.3 (L) 09/23/2019 0614   HGB 11.4 (L) 09/15/2019 1215   HGB 14.2 01/30/2019 1609   HCT 32.3 (L) 09/23/2019 0614   HCT 41.6 01/30/2019 1609   PLT 307 09/23/2019 0614   PLT 64 (L) 09/15/2019 1215   PLT 292 01/30/2019 1609   Comprehensive Metabolic Panel:    Component Value  Date/Time   NA 139 09/23/2019 0614   NA 139 02/24/2019 1246   K 4.3 09/23/2019 0614   BUN 33 (H) 09/23/2019 0614   BUN 18 02/24/2019 1246   CREATININE 0.98 09/23/2019 0614   CREATININE 0.95 09/15/2019 1215   ALBUMIN 3.0 (L) 09/17/2019 0111     Review of Systems  Constitutional: Positive for activity change, appetite change and fatigue.  Respiratory: Positive for shortness of breath.   Neurological: Positive for weakness.  Unless otherwise noted, a complete review of systems is negative.  Physical Exam General: NAD, frail chronically-ill appearing Cardiovascular: regular rate and rhythm Pulmonary: 2 L/[place], shortness of breath on exertion, congested cough, wheezing, crackles Abdomen: soft, nontender, + bowel sounds Extremities: no edema, no joint deformities Skin: no rashes, warm and dry Neurological: Alert and oriented x3, mood appropriate  Prognosis: POOR  Discharge Planning:  Home with Palliative Services with an express goal to transition care to hospice after further discussions with oncology  Recommendations: . DNR/DNI-as confirmed/requested by patient and George.  Out of facility form completed and placed on chart . Continue with current plan of care per medical team.  No escalation of care. . Patient expressed goals of no further chemotherapy with a focus on quality of life, family, happiness with what time he has left.  Would like to speak with George Vega (states he has a scheduled appointment on Monday) for further discussion regarding no other potential treatments outside of chemotherapy. . Patient requesting outpatient palliative support with a goal of transitioning to a more comfort/EOL focus with hospice in the future. . Patient will require home oxygen. Marland Kitchen  PMT will continue to support and follow. Please call team line with urgent needs.   Palliative Performance Scale: PPS 20-30%              Patient and George expressed understanding and was in agreement with this  plan.   Thank you for allowing the Palliative Medicine Team to assist in the care of this patient.  Time In: 1230 Time Out: 1345 Time Total: 75 min.   Visit consisted of counseling and education dealing with the complex and emotionally intense issues of symptom management and palliative care in the setting of serious and potentially life-threatening illness.Greater than 50%  of this time was spent counseling and coordinating care related to the above assessment and plan.  Signed by:  Alda Lea, AGPCNP-BC Palliative Medicine Team  Phone: [phone] Pager: [phone] Amion: Bjorn Pippin

## 2019-09-23 NOTE — Progress Notes (Signed)
PROGRESS NOTE    George Vega  UDJ:[id number] DOB: [birth year] DOA: 09/17/2019 PCP: [name], MD   Chef Complaints: Shortness of breath  Brief Narrative: As per Dr. Lorin Mercy" 79 y.o. male with medical history significant of CVA; stage 1 RLL SCC; HTN; CAD; afib; and CHF presenting with SOB.  He has been having trouble. He started chemo 2 1/2 weeks ago. Since then, he has been having problems with his breathing, more pronounced. It was intermittent. A few days ago, he started with panting, wheezing. Difficulty sleeping and staying awake. It got worse last night and they had to come in. +audible wheezing. + cough. He does not wear home O2 but they think he needs it. His pulse ox has been in the 90s at home. +LE edema, L >R, intermittently. No obvious orthopnea. +PND. Panting with minimal exertion. No fevers. Mild rhinorrhea x 2 days." ED CT negative for PE BNP elevated in 500 patient hypoxic in the 80s given IV Lasix, also had leukocytosis could be from recent Neulasta, patient was admitted for further management.  09/18/09 Acutely short of breath this am with wheezing, resp distress- sent ot ICU on bipap, steroid iv and s/p iv lasix. Patient transferred out of ICU on the steroids bronchodilators with slowly improving respiratory status. CXR 7/11  " Atelectatic changes in the right mid lung and right base regions, essentially stable." 7/14-changed to oral steroid and is being monitored.  Subjective:  Seen this morning feels well oxygen at 2 L nasal cannula.  Nursing and patient reports he ambulated in the hallway and was having ongoing oxygen in the 80s on room air needing supplemental oxygen. Producing mucoid cough.  No fever.  Assessment & Plan:  Acute on chronic hypoxic respiratory failure/Acute resp distress suspecting acute COPD exacerbation with significant wheezing also contributed by his malignant/metastatic lung cancer deconditioning: Patient was sent to stepdown on  BiPAP on 7/10, stabilized and was transferred out.  Patient has been hypoxic and dyspneic mostly with activity.  He has bilateral lung wheezing but improving.  We will switch to oral steroid today and monitor next 24 hours to make sure he is able to tolerate oral steroid and does not get acutely short of breath last time.  Continue bronchodilators, ceftriaxone/doxycycline.   Acute decompensated diastolic CHF, with BNP in 500 on admission.  Patient needed iv lasix x 2. Patient echocardiogram was reviewed echocardiogram relatively unremarkable. euvolemic and stable  New fever in ICU, unclear infection/source/could be secondary to atelectasis.  No Pneumonia so far on the chest x-ray. blood cultures negative so far.  Remains afebrile continue empiric antibiotics.  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 Julien Nordmann.  Patient would like to look into stopping chemo/treatment and he is going to follow-up with Dr. Inda Merlin and discuss in the clinic.palliative care has been consulted. He is full code.  Leukocytosis likely from Neulasta end of jun 28, steroid, possible infection.  WBC count slightly downtrending, but he is afebrile.  Blood and urine culture from 7/10 no growth so far.    Recent Labs  Lab 09/17/19 0111 09/18/19 0946 09/20/19 0249 09/22/19 0605 09/23/19 0614  WBC 26.1* 32.3* 27.5* 32.8* 27.1*   Hyperlipemia: On statin.  HTN: BP  stable on metoprolol  PAF rate controlled on amiodarone metoprolol and anticoagulation with Eliquis   Obesity (BMI 30.0-34.9): Will benefit with weight loss and healthy lifestyle.  DVT prophylaxis:ELIQUIS Code Status: full Family  Communication: plan of care discussed with patient at bedside.  Discussed with patient's daughter at the bedside 7/13.  Status is: inpatient Patient remains hospitalized for ongoing management of his respiratory failure wheezing.    Dispo: The patient is from: Home              Anticipated d/c is to: Home              Anticipated d/c date is: 1 day              Patient currently is not medically stable to d/c.  Anticipating discharge tomorrow if remains stable.   Nutrition: Diet Order            Diet Heart Room service appropriate? Yes; Fluid consistency: Thin  Diet effective now                 Body mass index is 31.76 kg/m.  Consultants: PCCM. Procedures:see note Microbiology:see note  Medications: Scheduled Meds: . amiodarone  200 mg Oral Daily  . apixaban  5 mg Oral BID  . baclofen  10 mg Oral BID  . chlorhexidine  15 mL Mouth Rinse BID  . feeding supplement (ENSURE ENLIVE)  237 mL Oral Q24H  . ferrous sulfate  325 mg Oral BID WC  . magnesium oxide  400 mg Oral BID  . mouth rinse  15 mL Mouth Rinse q12n4p  . metoprolol tartrate  25 mg Oral BID  . multivitamin  1 tablet Oral Daily  . pantoprazole  40 mg Oral Daily  . PARoxetine  40 mg Oral QHS  . polycarbophil  1,250 mg Oral QHS  . predniSONE  40 mg Oral Q breakfast  . rosuvastatin  5 mg Oral Daily   Continuous Infusions: . sodium chloride Stopped (09/19/19 1536)  . cefTRIAXone (ROCEPHIN)  IV 1 g (09/22/19 1644)    Antimicrobials: Anti-infectives (From admission, onward)   Start     Dose/Rate Route Frequency Ordered Stop   09/19/19 1400  cefTRIAXone (ROCEPHIN) 1 g in sodium chloride 0.9 % 100 mL IVPB     Discontinue     1 g 200 mL/hr over 30 Minutes Intravenous Every 24 hours 09/19/19 1219     09/17/19 1000  doxycycline (VIBRA-TABS) tablet 100 mg        100 mg Oral Every 12 hours 09/17/19 0814 09/22/19 0959       Objective: Vitals: Today's Vitals   09/22/19 2000 09/22/19 2102 09/23/19 0555 09/23/19  0900  BP:  140/80 135/77   Pulse:  63 (!) 58   Resp:  (!) 24 18   Temp:  98.1 F (36.7 C) (!) 97.5 F (36.4 C)   TempSrc:  Oral Oral   SpO2:  97% 99%   Weight:      Height:      PainSc: 0-No pain   0-No pain    Intake/Output Summary (Last 24 hours) at 09/23/2019 1234 Last data filed at 09/23/2019 0819 Gross per 24 hour  Intake 0 ml  Output 625 ml  Net -625 ml   Filed Weights   09/17/19 0102  Weight: 97.6 kg   Weight change:    Intake/Output from previous day: 07/13 0701 - 07/14 0700 In: 120 [P.O.:120] Out: 450 [Urine:450] Intake/Output this shift: Total I/O  In: -  Out: 175 [Urine:175]  Examination: General exam: AAOx3 , NAD, weak appearing. HEENT:Oral mucosa moist, Ear/Nose WNL grossly, dentition normal. Respiratory system: bilaterally mild wheezing on expiration, basal crackles,no use of accessory muscle. Cardiovascular system: S1 & S2 +, No JVD. Gastrointestinal system: Abdomen soft, NT,ND, BS+. Nervous System:Alert, awake, moving extremities and grossly nonfocal. Extremities: No edema, distal peripheral pulses palpable.  Skin: No rashes,no icterus. MSK: Normal muscle bulk,tone, power.  Data Reviewed: I have personally reviewed following labs and imaging studies CBC: Recent Labs  Lab 09/17/19 0111 09/17/19 0111 09/18/19 0946 09/19/19 0844 09/20/19 0249 09/22/19 0605 09/23/19 0614  WBC 26.1*  --  32.3*  --  27.5* 32.8* 27.1*  NEUTROABS 18.0*  --   --   --  23.0*  --   --   HGB 11.4*   < > 11.5* 12.6* 10.1* 10.3* 10.3*  HCT 35.1*   < > 36.0* 37.0* 31.4* 31.3* 32.3*  MCV 97.8  --  100.0  --  99.4 97.5 100.0  PLT 110*  --  186  --  226 292 307   < > = values in this interval not displayed.   Basic Metabolic Panel: Recent Labs  Lab 09/17/19 0111 09/17/19 0111 09/18/19 0946 09/19/19 0844 09/20/19 0249 09/22/19 0605 09/23/19 0614  NA 136   < > 135 139 136 140 139  K 4.1   < > 4.4 4.6 5.0 4.4 4.3  CL 102  --  99  --  98 103 104  CO2 23  --  24   --  29 25 28   GLUCOSE 127*  --  194*  --  174* 171* 162*  BUN 8  --  19  --  29* 34* 33*  CREATININE 0.90  --  1.15  --  1.08 1.01 0.98  CALCIUM 8.6*  --  8.8*  --  8.6* 8.5* 8.5*   < > = values in this interval not displayed.   GFR: Estimated Creatinine Clearance: 71.6 mL/min (by C-G formula based on SCr of 0.98 mg/dL). Liver Function Tests: Recent Labs  Lab 09/17/19 0111  AST 58*  ALT 66*  ALKPHOS 219*  BILITOT 0.8  PROT 5.9*  ALBUMIN 3.0*   No results for input(s): LIPASE, AMYLASE in the last 168 hours. No results for input(s): AMMONIA in the last 168 hours. Coagulation Profile: No results for input(s): INR, PROTIME in the last 168 hours. Cardiac Enzymes: No results for input(s): CKTOTAL, CKMB, CKMBINDEX, TROPONINI in the last 168 hours. BNP (last 3 results) No results for input(s): PROBNP in the last 8760 hours. HbA1C: No results for input(s): HGBA1C in the last 72 hours. CBG: Recent Labs  Lab 09/20/19 2144 09/21/19 0758 09/21/19 1222 09/21/19 1643  GLUCAP 188* 157* 237* 208*   Lipid Profile: No results for input(s): CHOL, HDL, LDLCALC, TRIG, CHOLHDL, LDLDIRECT in the last 72 hours. Thyroid Function Tests: No results for input(s): TSH, T4TOTAL, FREET4, T3FREE, THYROIDAB in the last 72 hours. Anemia Panel: No results for input(s): VITAMINB12, FOLATE, FERRITIN, TIBC, IRON, RETICCTPCT in the last 72 hours. Sepsis Labs: Recent Labs  Lab 09/19/19 0920 09/20/19 0249 09/21/19 0558  PROCALCITON 0.16 0.78 0.39    Recent Results (from the past 240 hour(s))  SARS Coronavirus 2 by RT PCR (hospital order, performed in [facility] hospital lab) Nasopharyngeal Nasopharyngeal Swab     Status: None   Collection Time: 09/17/19  2:48 AM   Specimen: Nasopharyngeal Swab  Result Value Ref Range Status  SARS Coronavirus 2 NEGATIVE NEGATIVE Final    Comment: (NOTE) SARS-CoV-2 target nucleic acids are NOT DETECTED.  The SARS-CoV-2 RNA is generally detectable in upper and  lower respiratory specimens during the acute phase of infection. The lowest concentration of SARS-CoV-2 viral copies this assay can detect is 250 copies / mL. A negative result does not preclude SARS-CoV-2 infection and should not be used as the sole basis for treatment or other patient management decisions.  A negative result may occur with improper specimen collection / handling, submission of specimen other than nasopharyngeal swab, presence of viral mutation(s) within the areas targeted by this assay, and inadequate number of viral copies (<250 copies / mL). A negative result must be combined with clinical observations, patient history, and epidemiological information.  Fact Sheet for Patients:   StrictlyIdeas.no  Fact Sheet for Healthcare Providers: BankingDealers.co.za  This test is not yet approved or  cleared by the [place] FDA and has been authorized for detection and/or diagnosis of SARS-CoV-2 by FDA under an Emergency Use Authorization (EUA).  This EUA will remain in effect (meaning this test can be used) for the duration of the COVID-19 declaration under Section 564(b)(1) of the Act, 21 U.S.C. section 360bbb-3(b)(1), unless the authorization is terminated or revoked sooner.  Performed at [facility], [place] [address] [place], [place] [address]   MRSA PCR Screening     Status: None   Collection Time: 09/19/19 10:00 AM   Specimen: Nasal Mucosa; Nasopharyngeal  Result Value Ref Range Status   MRSA by PCR NEGATIVE NEGATIVE Final    Comment:        The GeneXpert MRSA Assay (FDA approved for NASAL specimens only), is one component of a comprehensive MRSA colonization surveillance program. It is not intended to diagnose MRSA infection nor to guide or monitor treatment for MRSA infections. Performed at [facility], [place] [address], [place], [place] [address]   Urine Culture     Status: None     Collection Time: 09/19/19 12:20 PM   Specimen: Urine, Catheterized  Result Value Ref Range Status   Specimen Description   Final    URINE, CATHETERIZED Performed at [place] [address], [place], [place] [address]    Special Requests   Final    NONE Performed at [facility], [place] [address] [place], [place] [address]    Culture   Final    NO GROWTH Performed at [facility], [place] [address], [place], [place] [address]    Report Status 09/20/2019 FINAL  Final  Culture, blood (routine x 2)     Status: None (Preliminary result)   Collection Time: 09/19/19 12:44 PM   Specimen: BLOOD  Result Value Ref Range Status   Specimen Description   Final    BLOOD LEFT ANTECUBITAL Performed at [facility], [place] [address] [place], [place] [address]    Special Requests   Final    BOTTLES DRAWN AEROBIC ONLY Blood Culture results may not be optimal due to an inadequate volume of blood received in culture bottles Performed at [place] [address] [place], [place] [address]    Culture   Final    NO GROWTH 4 DAYS Performed at [facility], [place] [address], [place], [place] [address]    Report Status PENDING  Incomplete  Culture, blood (routine x 2)     Status: None (Preliminary result)   Collection  Time: 09/19/19  1:31 PM   Specimen: BLOOD  Result Value Ref Range Status   Specimen Description   Final    BLOOD LEFT ANTECUBITAL Performed at [facility], [place] [address], [place], [place] [address]    Special Requests   Final    BOTTLES DRAWN AEROBIC AND ANAEROBIC Blood Culture adequate volume Performed at [place] [address] [place], [place] [address]    Culture   Final    NO GROWTH 4 DAYS Performed at [facility], [place] [address] [place], [place] [address]    Report Status PENDING  Incomplete      Radiology Studies: No results found.   LOS: 5 days    Antonieta Pert, MD [facility]  09/23/2019, 12:34 PM

## 2019-09-23 NOTE — Progress Notes (Signed)
An update since pt taking po prednisone, he has been feeling more short of breath although O2 sats have been 98% on room air at rest and on O2 at 2-3 liters. Resp rate is 28. No wheezing but still fine crackles are noted in bases. Md made aware. Eulas Post, RN

## 2019-09-24 ENCOUNTER — Telehealth: Payer: Self-pay | Admitting: Internal Medicine

## 2019-09-24 ENCOUNTER — Telehealth: Payer: Self-pay | Admitting: *Deleted

## 2019-09-24 ENCOUNTER — Ambulatory Visit: Payer: Medicare Other | Admitting: Cardiothoracic Surgery

## 2019-09-24 LAB — CBC
HCT: 36.3 % — ABNORMAL LOW (ref 39.0–52.0)
Hemoglobin: 11.3 g/dL — ABNORMAL LOW (ref 13.0–17.0)
MCH: 31.4 pg (ref 26.0–34.0)
MCHC: 31.1 g/dL (ref 30.0–36.0)
MCV: 100.8 fL — ABNORMAL HIGH (ref 80.0–100.0)
Platelets: 320 10*3/uL (ref 150–400)
RBC: 3.6 MIL/uL — ABNORMAL LOW (ref 4.22–5.81)
RDW: 14.6 % (ref 11.5–15.5)
WBC: 27.2 10*3/uL — ABNORMAL HIGH (ref 4.0–10.5)
nRBC: 0.1 % (ref 0.0–0.2)

## 2019-09-24 LAB — CULTURE, BLOOD (ROUTINE X 2)
Culture: NO GROWTH
Culture: NO GROWTH
Special Requests: ADEQUATE

## 2019-09-24 LAB — BASIC METABOLIC PANEL
Anion gap: 12 (ref 5–15)
BUN: 32 mg/dL — ABNORMAL HIGH (ref 8–23)
CO2: 30 mmol/L (ref 22–32)
Calcium: 8.6 mg/dL — ABNORMAL LOW (ref 8.9–10.3)
Chloride: 100 mmol/L (ref 98–111)
Creatinine, Ser: 0.91 mg/dL (ref 0.61–1.24)
GFR calc Af Amer: >60 mL/min (ref >60)
GFR calc non Af Amer: >60 mL/min (ref >60)
Glucose, Bld: 152 mg/dL — ABNORMAL HIGH (ref 70–99)
Potassium: 4.2 mmol/L (ref 3.5–5.1)
Sodium: 142 mmol/L (ref 135–145)

## 2019-09-24 MED ORDER — AMIODARONE HCL 200 MG PO TABS
200.0000 mg | ORAL_TABLET | Freq: Every day | ORAL | Status: DC
  Administered 2019-09-25: 200 mg via ORAL
  Filled 2019-09-24: qty 1

## 2019-09-24 MED ORDER — PREDNISONE 20 MG PO TABS
40.0000 mg | ORAL_TABLET | Freq: Every day | ORAL | Status: DC
  Administered 2019-09-24 – 2019-09-25 (×2): 40 mg via ORAL
  Filled 2019-09-24 (×2): qty 2

## 2019-09-24 NOTE — Progress Notes (Signed)
PROGRESS NOTE    George Vega  GUR:[id number] DOB: December 24, 1940 DOA: 09/17/2019 PCP: [name], MD   Chef Complaints: Shortness of breath  Brief Narrative: As per Dr. Lorin Mercy" 79 y.o. male with medical history significant of CVA; stage 1 RLL SCC; HTN; CAD; afib; and CHF presenting with SOB.  He has been having trouble. He started chemo 2 1/2 weeks ago. Since then, he has been having problems with his breathing, more pronounced. It was intermittent. A few days ago, he started with panting, wheezing. Difficulty sleeping and staying awake. It got worse last night and they had to come in. +audible wheezing. + cough. He does not wear home O2 but they think he needs it. His pulse ox has been in the 90s at home. +LE edema, L >R, intermittently. No obvious orthopnea. +PND. Panting with minimal exertion. No fevers. Mild rhinorrhea x 2 days." ED CT negative for PE BNP elevated in 500 patient hypoxic in the 80s given IV Lasix, also had leukocytosis could be from recent Neulasta, patient was admitted for further management.  09/18/09 Acutely short of breath this am with wheezing, resp distress- sent to ICU on bipap, steroid iv and s/p iv lasix.Patient transferred out of ICU on the steroids bronchodilators with slowly improving respiratory status. CXR 7/11  " Atelectatic changes in the right mid lung and right base regions, essentially stable." 7/14-changed to oral steroid and is being monitored.  The evening patient had worsening shortness of breath was placed back on IV steroid and also IV Lasix.  Subjective:  Patient feels well this morning no shortness of breath. Denies chest pain nausea vomiting.  Has been peeing all night.  Assessment & Plan:  Acute on chronic hypoxic respiratory failure/Acute resp distress suspecting acute COPD exacerbation with significant wheezing also contributed by his malignant/metastatic lung cancer deconditioning and CHF: Did not tolerate transition to p.o.  steroids yesterday, got IV steroid 40 mg x 1.  Also placed on IV Lasix, will continue IV diuresis and switch him to oral steroid and monitor next 24 hours.  Will need supplemental oxygen upon discharge.  Continue empiric antibiotics ceftriaxone/doxycycline and bronchodilators.  Acute decompensated diastolic CHF, with BNP in 500 on admission.  Patient needed iv lasix x 2.  Chest x-ray 7/14 with low lung volumes and areas of interstitial prominence throughout the mid to lower lungs bilaterally, patient takes Lasix 3 times a week at home.  Suspecting some acute decompensation, placed on IV Lasix with improving symptoms.  IV Lasix for next 24 hours.  Echocardiogram this admission ,normal lvef, no acute finding.    New fever in ICU, unclear infection/source/could be secondary to atelectasis.  No Pneumonia so far on the chest x-ray. blood cultures negative so far.  Remains afebrile continue empiric antibiotics.  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 Julien Nordmann.  Patient would like to look into stopping chemo/treatment and he is going to follow-up with Dr. Inda Merlin and discuss in the clinic.palliative care has been consulted and patient has been changed to DNR.  Leukocytosis likely from Neulasta end of jun 28, steroid, possible infection.  WBC remains up downtrending but afebrile.  Blood culture no growth from 7/10.   Recent Labs  Lab 09/18/19  4650 09/20/19 0249 09/22/19 0605 09/23/19 0614 09/24/19 0512  WBC 32.3* 27.5* 32.8* 27.1* 27.2*   Hyperlipemia: Continue home statin.Marland Kitchen  HTN: With mild bradycardia hold metoprolol today.    PAF: Sinus rhythm with a heart rate in low in 50s  holding Amio,metoprolol today.  Continue Eliquis.   Obesity (BMI 30.0-34.9): Will benefit with weight loss and healthy lifestyle.  DVT prophylaxis:ELIQUIS Code Status: full Family Communication: plan of care discussed with patient at bedside.  Status is: inpatient Patient remains hospitalized for ongoing management of his respiratory failure wheezing.    Dispo: The patient is from: Home              Anticipated d/c is to: Home              Anticipated d/c date is: 1 day              Patient currently is not medically stable to d/c.  Being transition to oral steroid and will monitor next 24 hours if able to tolerate anticipate discharge tomorrow.  Encourage ambulation/ He would like to return home soon given that he has metastatic cancer and would like to spend more time with his family but family would like to make sure he is not short of breath like yesterday evening.  Nutrition: Diet Order            Diet Heart Room service appropriate? Yes; Fluid consistency: Thin  Diet effective now                 Body mass index is 31.76 kg/m.  Consultants: PCCM. Procedures:see note Microbiology:see note  Medications: Scheduled Meds: . [START ON 09/25/2019] amiodarone  200 mg Oral Daily  . apixaban  5 mg Oral BID  . baclofen  10 mg Oral BID  . chlorhexidine  15 mL Mouth Rinse BID  . ferrous sulfate  325 mg Oral BID WC  . furosemide  20 mg Intravenous Daily  . magnesium oxide  400 mg Oral BID  . mouth rinse  15 mL Mouth Rinse q12n4p  . multivitamin  1 tablet Oral Daily  . pantoprazole  40 mg Oral Daily  . PARoxetine  40 mg Oral QHS  . polycarbophil  1,250 mg Oral QHS  . predniSONE  40 mg Oral Q breakfast  . rosuvastatin  5 mg Oral Daily   Continuous Infusions: . sodium chloride Stopped (09/19/19 1536)  . cefTRIAXone (ROCEPHIN)  IV 1 g (09/23/19 1605)    Antimicrobials: Anti-infectives (From admission, onward)   Start     Dose/Rate Route Frequency Ordered Stop   09/19/19  1400  cefTRIAXone (ROCEPHIN) 1 g in sodium chloride 0.9 % 100 mL IVPB     Discontinue     1 g 200 mL/hr over 30 Minutes Intravenous Every 24 hours 09/19/19 1219     09/17/19 1000  doxycycline (VIBRA-TABS) tablet 100 mg        100 mg Oral Every 12 hours 09/17/19 0814 09/22/19 0959       Objective: Vitals: Today's Vitals   09/23/19 2055 09/23/19 2056 09/24/19 0601 09/24/19 0833  BP:  (!) 153/64 (!) 146/64 (!) 151/75  Pulse:  61 (!) 54 (!) 57  Resp:  19 17 20   Temp:  99.7 F (37.6 C) 97.7 F (36.5 C)   TempSrc:  Oral    SpO2:  95% 96% 97%  Weight:      Height:      PainSc: 0-No pain   0-No  pain    Intake/Output Summary (Last 24 hours) at 09/24/2019 1102 Last data filed at 09/24/2019 0855 Gross per 24 hour  Intake 120 ml  Output 1720 ml  Net -1600 ml   Filed Weights   09/17/19 0102  Weight: 97.6 kg   Weight change:    Intake/Output from previous day: 07/14 0701 - 07/15 0700 In: 120 [P.O.:120] Out: 1775 [Urine:1775] Intake/Output this shift: Total I/O In: -  Out: 120 [Urine:120]  Examination: General exam:AAOx3,NAD,weak appearing. HEENT:Oral mucosa moist,Ear/Nose WNL grossly, dentition normal. Respiratory system: Mild expiratory wheezing, diminished breath sounds at the base, no use of accessory muscle. Cardiovascular system:S1 & S2 +,No JVD. Gastrointestinal system:Abdomen soft, NT,ND,BS+. Nervous System:Alert,awake, moving extremities and grossly non-focal. Extremities:No edema,distal peripheral pulses palpable.  Skin:No rashes,no icterus. EHM:CNOBSJ muscle bulk,tone, power.  Data Reviewed: I have personally reviewed following labs and imaging studies CBC: Recent Labs  Lab 09/18/19 0946 09/18/19 0946 09/19/19 0844 09/20/19 0249 09/22/19 0605 09/23/19 0614 09/24/19 0512  WBC 32.3*  --   --  27.5* 32.8* 27.1* 27.2*  NEUTROABS  --   --   --  23.0*  --   --   --   HGB 11.5*   < > 12.6* 10.1* 10.3* 10.3* 11.3*  HCT 36.0*   < > 37.0* 31.4* 31.3* 32.3*  36.3*  MCV 100.0  --   --  99.4 97.5 100.0 100.8*  PLT 186  --   --  226 292 307 320   < > = values in this interval not displayed.   Basic Metabolic Panel: Recent Labs  Lab 09/18/19 0946 09/18/19 0946 09/19/19 0844 09/20/19 0249 09/22/19 0605 09/23/19 0614 09/24/19 0512  NA 135   < > 139 136 140 139 142  K 4.4   < > 4.6 5.0 4.4 4.3 4.2  CL 99  --   --  98 103 104 100  CO2 24  --   --  29 25 28 30   GLUCOSE 194*  --   --  174* 171* 162* 152*  BUN 19  --   --  29* 34* 33* 32*  CREATININE 1.15  --   --  1.08 1.01 0.98 0.91  CALCIUM 8.8*  --   --  8.6* 8.5* 8.5* 8.6*   < > = values in this interval not displayed.   GFR: Estimated Creatinine Clearance: 77.1 mL/min (by C-G formula based on SCr of 0.91 mg/dL). Liver Function Tests: No results for input(s): AST, ALT, ALKPHOS, BILITOT, PROT, ALBUMIN in the last 168 hours. No results for input(s): LIPASE, AMYLASE in the last 168 hours. No results for input(s): AMMONIA in the last 168 hours. Coagulation Profile: No results for input(s): INR, PROTIME in the last 168 hours. Cardiac Enzymes: No results for input(s): CKTOTAL, CKMB, CKMBINDEX, TROPONINI in the last 168 hours. BNP (last 3 results) No results for input(s): PROBNP in the last 8760 hours. HbA1C: No results for input(s): HGBA1C in the last 72 hours. CBG: Recent Labs  Lab 09/20/19 2144 09/21/19 0758 09/21/19 1222 09/21/19 1643  GLUCAP 188* 157* 237* 208*   Lipid Profile: No results for input(s): CHOL, HDL, LDLCALC, TRIG, CHOLHDL, LDLDIRECT in the last 72 hours. Thyroid Function Tests: No results for input(s): TSH, T4TOTAL, FREET4, T3FREE, THYROIDAB in the last 72 hours. Anemia Panel: No results for input(s): VITAMINB12, FOLATE, FERRITIN, TIBC, IRON, RETICCTPCT in the last 72 hours. Sepsis Labs: Recent Labs  Lab 09/19/19 0920 09/20/19 0249 09/21/19 0558  PROCALCITON 0.16 0.78 0.39  Recent Results (from the past 240 hour(s))  SARS Coronavirus 2 by RT PCR  (hospital order, performed in [facility] hospital lab) Nasopharyngeal Nasopharyngeal Swab     Status: None   Collection Time: 09/17/19  2:48 AM   Specimen: Nasopharyngeal Swab  Result Value Ref Range Status   SARS Coronavirus 2 NEGATIVE NEGATIVE Final    Comment: (NOTE) SARS-CoV-2 target nucleic acids are NOT DETECTED.  The SARS-CoV-2 RNA is generally detectable in upper and lower respiratory specimens during the acute phase of infection. The lowest concentration of SARS-CoV-2 viral copies this assay can detect is 250 copies / mL. A negative result does not preclude SARS-CoV-2 infection and should not be used as the sole basis for treatment or other patient management decisions.  A negative result may occur with improper specimen collection / handling, submission of specimen other than nasopharyngeal swab, presence of viral mutation(s) within the areas targeted by this assay, and inadequate number of viral copies (<250 copies / mL). A negative result must be combined with clinical observations, patient history, and epidemiological information.  Fact Sheet for Patients:   StrictlyIdeas.no  Fact Sheet for Healthcare Providers: BankingDealers.co.za  This test is not yet approved or  cleared by the [place] FDA and has been authorized for detection and/or diagnosis of SARS-CoV-2 by FDA under an Emergency Use Authorization (EUA).  This EUA will remain in effect (meaning this test can be used) for the duration of the COVID-19 declaration under Section 564(b)(1) of the Act, 21 U.S.C. section 360bbb-3(b)(1), unless the authorization is terminated or revoked sooner.  Performed at [facility], [place] [address], [place], [place] [address]   MRSA PCR Screening     Status: None   Collection Time: 09/19/19 10:00 AM   Specimen: Nasal Mucosa; Nasopharyngeal  Result Value Ref Range Status   MRSA by PCR NEGATIVE NEGATIVE Final     Comment:        The GeneXpert MRSA Assay (FDA approved for NASAL specimens only), is one component of a comprehensive MRSA colonization surveillance program. It is not intended to diagnose MRSA infection nor to guide or monitor treatment for MRSA infections. Performed at [facility], [place] [address] [place], [place] [address]   Urine Culture     Status: None   Collection Time: 09/19/19 12:20 PM   Specimen: Urine, Catheterized  Result Value Ref Range Status   Specimen Description   Final    URINE, CATHETERIZED Performed at [place] [address] [place], [place] [address]    Special Requests   Final    NONE Performed at [facility], [place] [address], [place], [place] [address]    Culture   Final    NO GROWTH Performed at [facility], [place] [address], [place], [place] [address]    Report Status 09/20/2019 FINAL  Final  Culture, blood (routine x 2)     Status: None (Preliminary result)   Collection Time: 09/19/19 12:44 PM   Specimen: BLOOD  Result Value Ref Range Status   Specimen Description   Final    BLOOD LEFT ANTECUBITAL Performed at [facility], [place] [address], [place], [place] [address]    Special Requests   Final    BOTTLES DRAWN AEROBIC ONLY Blood Culture results may not be optimal due to an inadequate volume of blood received in culture bottles Performed at [place] [address] [place], [place] [address]  Culture   Final    NO GROWTH 4 DAYS Performed at [facility], [place] [address] [place], [place] [address]    Report Status PENDING  Incomplete  Culture, blood (routine x 2)     Status: None (Preliminary result)   Collection Time: 09/19/19  1:31 PM   Specimen: BLOOD  Result Value Ref Range Status   Specimen Description   Final    BLOOD LEFT ANTECUBITAL Performed at [facility], [place] [address] [place], [place] [address]    Special  Requests   Final    BOTTLES DRAWN AEROBIC AND ANAEROBIC Blood Culture adequate volume Performed at [place] [address] [place], [place] [address]    Culture   Final    NO GROWTH 4 DAYS Performed at [facility], [place] [address], [place], [place] [address]    Report Status PENDING  Incomplete      Radiology Studies: DG Chest 2 View  Result Date: 09/23/2019 CLINICAL DATA:  79 year old male with history of shortness of breath. EXAM: CHEST - 2 VIEW COMPARISON:  Chest x-ray 09/20/2019. FINDINGS: Low lung volumes. Patchy areas of interstitial prominence throughout the mid to lower lungs bilaterally (right greater than left), similar to the prior study. No consolidative airspace disease. Trace right pleural effusion. No left pleural effusion. No pneumothorax. No pulmonary nodule or mass noted. Pulmonary vasculature and the cardiomediastinal silhouette are within normal limits. Atherosclerosis in the thoracic aorta. Status post bilateral shoulder arthroplasty. IMPRESSION: 1. Low lung volumes with areas of interstitial prominence throughout the mid to lower lungs bilaterally, similar to prior examinations, suggestive of interstitial lung disease. 2. Trace right pleural effusion. 3. Aortic atherosclerosis. Electronically Signed   By: Vinnie Langton M.D.   On: 09/23/2019 12:38     LOS: 6 days   Antonieta Pert, MD [facility]  09/24/2019, 11:02 AM

## 2019-09-24 NOTE — Progress Notes (Signed)
Daily Progress Note   Patient Name: George Vega       Date: 09/24/2019 DOB: [birth year]  Age: 79 y.o. MRN#: [id number] Attending Physician: Antonieta Pert, MD Primary Care Physician: [name], MD Admit Date: 09/17/2019  Reason for Consultation/Follow-up: Establishing goals of care  Subjective: Patient awake and alert in bed. Denies pain, complains of shortness of breath. Reports he did not have a good night due to increased shortness of breath and distress. He was restarted on IV lasix and continued steroids. Wife is at the bed side. She reports appointment with Dr. Julien Nordmann has been rescheduled to a later date next week for convenience. Patient would not be able to make it to an early morning appointment given his severe weakness and need for assistance. Support given.   Patient and wife express disappointment with not being able to discharge home. Patient express concerns stating "I want to go and I don't want to die here I want to be with my family at home!" Support given. Education provided to patient if his wishes are to be home with family and comfortable he can be set-up with hospice support and discharge with awareness his goal is for comfort. George Vega and wife verbalizes understanding, however does not wish to make this decision until speaking with Dr. Julien Nordmann and gaining his approval with their decisions. He does express that he knows he is not planning to undergo any further chemotherapy treatment because his quality of life has drastically decreased since he started. Support given.   Patient and wife hopeful he may show some improvement and can get home over the weekend but again is willing to remain hospitalized until a final plan can be made in collaboration with Oncology.   All questions answered and support given.   HPI: Palliative Care consult requested for goals of care discussion in this 79 y.o. male with multiple medical problems including hypertension, CVA, depression,  CHF, metastatic non-small cell lung cancer with liver lesions, poorly differentiated carcinoma with neuroendocrine (June 2021), s/p right lower lobe segmentectomy/lymph node dissection (March 2020) s/p chemotherapy (09/02/2019).  Patient presented to ED with concerns of increased shortness of breath.  Patient reported worsening shortness of breath since initial chemotherapy treatment.  CT angio of the chest was negative for PE.  Patient hypoxic (80s).  Was started on IV Lasix and steroids.  BNP 500.  Patient initially required BiPAP support and was able to transition to nasal cannula.  Chest x-ray showed right lung atelectasis.  Length of Stay: 6 days  Vital Signs: BP 136/76 (BP Location: Right Arm)    Pulse (!) 59    Temp 97.9 F (36.6 C) (Oral)    Resp 20    Ht 5\' 9"  (1.753 m)    Wt 97.6 kg    SpO2 98%    BMI 31.76 kg/m  SpO2: SpO2: 98 % O2 Device: O2 Device: Nasal Cannula O2 Flow Rate: O2 Flow Rate (L/min): 2 L/min  Physical Exam: -NAD, A&O x3, frail, ill-appearing -RRR -shortness of breath, wheezing -mood appropriate, follows commands            Palliative Care Assessment & Plan    Code Status:  DNR  Goals of Care/Recommendations:  Continue current plan of care per medical team  Patient is hopeful he can return home with family once stabilized. He does not wish to spend what time he has in the hospital but in his home with family. Has made a decision to discontinue chemotherapy however  would like to discuss further with Dr. Julien Nordmann prior to make final decisions regarding hospice.   Outpatient Palliative at discharge with plans to later transition to hospice  PMT will continue to support and follow.    Prognosis: Poor  Discharge Planning: To Be Determined Home with outpatient Palliative   Care plan was discussed with Dr. Lupita Leash.   Thank you for allowing the Palliative Medicine Team to assist in the care of this patient.  Time Total:45 min.   Visit consisted of counseling  and education dealing with the complex and emotionally intense issues of symptom management and palliative care in the setting of serious and potentially life-threatening illness.Greater than 50%  of this time was spent counseling and coordinating care related to the above assessment and plan.  Alda Lea, AGPCNP-BC  Palliative Medicine Team [phone]

## 2019-09-24 NOTE — Progress Notes (Signed)
Hydrologist [facility])  Hospital Liaison: RN note         Notified by [facility] manager of patient/family request for [facility] Palliative services at home after discharge.         This RN spoke with Mrs. Schaberg to confirm interest and explain services, discussed had of possibility of pt electing hospice if Dr. Earlie Server doesn't recommend any other tx.  Mrs. Harewood reports pt is done with chemo, didn't tolerate well.              [facility]iative team will follow up with patient after discharge.   Mrs. Delynn Flavin given [facility] contact numbers, reports plan to make [facility] aware if hospice becomes preferred plan.      Please call with any hospice or palliative related questions.         Thank you for this referral.         Domenic Moras, BSN, RN [place] (listed on [place] under Hospice[facility])    [phone]

## 2019-09-24 NOTE — Progress Notes (Signed)
CHART NOTE I saw the patient today for a social visit.  His wife and daughter were at the bedside.  The patient is feeling much better today.  He has some concern about his quality of life over the last 4-5 weeks when he was found to have disease recurrence and he underwent several studies including CT scan of the chest followed by a PET scan followed by biopsy then chemotherapy which was complicated by admission secondary to weakness and fatigue and lack of appetite.  The patient is worried that he did not spend much time with his family over the last few weeks because of the several studies and treatment. He is still thinking about what to do next about his condition.  He is getting better and will not make a decision regarding hospice at this point.  He would like to keep his appointment with me next week for more detailed discussion of his condition before making a final decision whether to stop chemotherapy and refer to hospice or take any further treatment. The wife and daughter were very supportive of his decision. I would strongly recommend for the patient to go home after stabilization of his condition and will discuss further option with him after discharge. Please let me know if you have any other questions.

## 2019-09-24 NOTE — TOC Progression Note (Signed)
Transition of Care [facility]) - Progression Note    Patient Details  Name: TYCE DELCID MRN: [id number] Date of Birth: 1940-12-15  Transition of Care [facility]) CM/SW Contact  Xolani Degracia, Juliann Pulse, RN Phone Number: 09/24/2019, 12:50 PM  Clinical Narrative: [facility] rep Santiago Glad for HHPT;home 02 ordered. Will await 02 sats. [employer] rep Zach following.otpt palliative care rep Chrislyn following.      Expected Discharge Plan: [place] Barriers to Discharge: Continued Medical Work up  Expected Discharge Plan and Services Expected Discharge Plan: [place]                         DME Arranged: Oxygen DME Agency: [employer] Date DME Agency Contacted: 09/24/19 Time DME Agency Contacted: 1250 Representative spoke with at DME Agency: [place]: PT [place]: [place] ([place]) Date [place]: 09/24/19 Time [place]: 1250 Representative spoke with at [place]: [place] ([place]) Interventions    Readmission Risk Interventions No flowsheet data found.

## 2019-09-24 NOTE — Telephone Encounter (Signed)
Received call from pt's wife about changing appt dates and times. Pt is still in the hospital and she is not sure when he will be discharged.  She is requesting an appt later in the week and a little later in the morning. She is also asking for chemo appts to be cancelled for next week. Spoke with Dr. Julien Nordmann and he is in agreement. Scheduling message sent. Wife is aware that scheduler will be contacting her.

## 2019-09-24 NOTE — Progress Notes (Signed)
SATURATION QUALIFICATIONS: (This note is used to comply with regulatory documentation for home oxygen)  Patient Saturations on Room Air at Rest = 92%  Patient Saturations on Room Air while Ambulating = 85%  Patient Saturations on 2 Liters of oxygen while Ambulating = 91%  Please briefly explain why patient needs home oxygen: Patients oxygen saturations dropped to 85% on room air while ambulating.

## 2019-09-24 NOTE — Care Management Important Message (Signed)
Important Message  Patient Details IM Letter given to Dessa Phi RN Case Manager to present to the Patient Name: George Vega MRN: [id number] Date of Birth: 28-Jun-1940   Medicare Important Message Given:  Yes     Kerin Salen 09/24/2019, 12:24 PM

## 2019-09-24 NOTE — Telephone Encounter (Signed)
Called pt per 7/15 sch msg - pt wife is aware of appt changes.

## 2019-09-25 ENCOUNTER — Inpatient Hospital Stay: Payer: Medicare Other

## 2019-09-25 MED ORDER — PREDNISONE 20 MG PO TABS: ORAL_TABLET | ORAL | 0 refills | Status: AC

## 2019-09-25 MED ORDER — FUROSEMIDE 40 MG PO TABS: 40.0000 mg | ORAL_TABLET | Freq: Every day | ORAL | 0 refills | Status: AC

## 2019-09-25 MED ORDER — CEPHALEXIN 500 MG PO CAPS: 500.0000 mg | ORAL_CAPSULE | Freq: Four times a day (QID) | ORAL | 0 refills | Status: AC

## 2019-09-25 NOTE — Discharge Summary (Signed)
Physician Discharge Summary  George Vega TKZ:[id number] DOB: 1941-01-21 DOA: 09/17/2019  PCP: [name], MD  Admit date: 09/17/2019 Discharge date: 09/25/2019  Admitted From: home Disposition: Home  Recommendations for Outpatient Follow-up:  1. Follow up with PCP in 1-2 weeks 2. Please obtain BMP/CBC in one week 3. Please follow up on the following pending results:  Home Health:yes  Equipment/Devices: oxygen  Discharge Condition: Stable Code Status: FULL Diet recommendation:  Diet Order            Diet - low sodium heart healthy           Diet Heart Room service appropriate? Yes; Fluid consistency: Thin  Diet effective now                  Brief/Interim Summary: As per Dr. Lorin Mercy" 79 y.o.malewith medical history significant ofCVA; stage 1 RLL SCC; HTN; CAD; afib; and CHF presenting with SOB.He has been having trouble. He started chemo 2 1/2 weeks ago. Since then, he has been having problems with his breathing, more pronounced. It was intermittent. A few days ago, he started with panting, wheezing. Difficulty sleeping and staying awake. It got worse last night and they had to come in. +audible wheezing. + cough. He does not wear home O2 but they think he needs it. His pulse ox has been in the 90s at home. +LE edema, L >R, intermittently. No obvious orthopnea. +PND. Panting with minimal exertion. No fevers. Mild rhinorrhea x 2 days." ED CT negative for PE BNP elevated in 500 patient hypoxic in the 80s given IV Lasix, also had leukocytosis could be from recent Neulasta, patient was admitted for further management.  09/18/09 Acutely short of breath this am with wheezing, resp distress- sent to ICU on bipap, steroid iv and s/p iv lasix.Patient transferred out of ICU on the steroids bronchodilators with slowly improving respiratory status. CXR 7/11  " Atelectatic changes in the right mid lung and right base regions, essentially stable." 7/14-changed to oral  steroid and is being monitored.  The evening patient had worsening shortness of breath was placed back on IV steroid and also IV Lasix. Patient was subsequently switched to oral steroid, he tolerated well at this time no more crackles or wheezing.  Shortness of breath is improved.  He will be set up to go home with oxygen. He has been very interested to go home has been discussing with his oncologist regarding stopping chemotherapy possible hospice but at this time plan is to have outpatient full evaluation about the chemo. This morning he feels well no shortness of breath.  He will be discharged home  Discharge Diagnoses:  Assessment & Plan:  Acute on chronic hypoxic respiratory failure/Acute resp distress suspecting acute COPD exacerbation with significant wheezing also contributed by his malignant/metastatic lung cancer deconditioning and CHF: Did not tolerate transition to p.o. steroids yesterday, got IV steroid 40 mg x 1.  Also placed on IV Lasix.  Patient tolerated p.o. steroid, will send him on a steroid taper.  We will discharge him on Lasix daily and have him follow-up with his PCP to reevaluate as he was on Lasix 3 times a week.  Continue empiric antibiotics.  This time overall stable  Acute decompensated diastolic CHF, with BNP in 500 on admission.  Patient needed iv lasix x 2.  Chest x-ray 7/14 with low lung volumes and areas of interstitial prominence throughout the mid to lower lungs bilaterally, patient takes Lasix 3 times a week at home.  Suspecting some acute decompensation, placed on IV Lasix with improving symptoms.  Will be discharged on oral Lasix and will follow with PCP. Echocardiogram this admission ,normal lvef, no acute finding.    New fever in ICU, unclear infection/source/could be secondary to atelectasis.  No Pneumonia so far on the chest x-ray. blood cultures negative so far.  No more fever episode.  Immunocompromised in the setting of lung cancer on chemo.  Continue  Keflex for few more days.  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 Julien Nordmann.  Patient would like to look into stopping chemo/treatment and he is going to follow-up with Dr. Inda Merlin and discuss in the clinic.palliative care has been consulted and patient has been changed to DNR.  Seen by Dr. Julien Nordmann 7/15 and is planning for outpatient evaluation tofurther discuss whether to continue or discontinue chemotherapy.  Leukocytosis likely from Neulasta end of jun 28, steroid, possible infection.  WBC remains up downtrending but afebrile.  Blood culture no growth from 7/10  Hyperlipemia: Continue home statin.Marland Kitchen  HTN:  Continue metoprolol with holding parameters advised to monitor blood pressure heart rate at home.    PAF: Sinus rhythm.  Is on amiodarone and Eliquis- advised to follow-up with cardiology and has an appointment next week-follow-up as scheduled.   Obesity (BMI 30.0-34.9): Will benefit with weight loss and healthy lifestyle   Consults:  Oncology, palliative care  Subjective: Resting well.  He feels much improved no shortness of breath chest pain nausea vomiting.  No more wheezing today. He is excited to go home  Discharge Exam: Vitals:   09/24/19 1413 09/25/19 0617  BP: 136/76 131/85  Pulse: (!) 59 (!) 57  Resp:  14  Temp: 97.9 F (36.6 C) 97.8 F (36.6 C)  SpO2: 98% 98%   General: Pt is alert, awake, not in acute distress Cardiovascular: RRR, S1/S2 +, no rubs, no gallops Respiratory: CTA bilaterally, no wheezing, no rhonchi Abdominal: Soft, NT, ND, bowel sounds + Extremities: no edema, no cyanosis  Discharge Instructions  Discharge  Instructions    Diet - low sodium heart healthy   Complete by: As directed    Discharge instructions   Complete by: As directed    Please call call MD or return to ER for similar or worsening recurring problem that brought you to hospital or if any fever,nausea/vomiting,abdominal pain, uncontrolled pain, chest pain,  shortness of breath or any other alarming symptoms.  Please check your HR and BP daily if HR Less than 81/XBJ or your systolic BP > 478 MM hg hold your metoprolol and follow up with your cardiology next week as scheduled  Please follow-up your doctor as instructed in a week time and reevalaute your lasix dosing  Please avoid alcohol, smoking, or any other illicit substance and maintain healthy habits including taking your regular medications as prescribed.  You were cared for by a hospitalist during your hospital stay. If you have any questions about your discharge medications or the care you received while you were in the hospital after you are discharged, you can call the unit and ask to speak with the hospitalist on call if the hospitalist that took care of you is not available.  Once you are discharged,  your primary care physician will handle any further medical issues. Please note that NO REFILLS for any discharge medications will be authorized once you are discharged, as it is imperative that you return to your primary care physician (or establish a relationship with a primary care physician if you do not have one) for your aftercare needs so that they can reassess your need for medications and monitor your lab values   Increase activity slowly   Complete by: As directed      Allergies as of 09/25/2019      Reactions   Glucosamine-chondroitin Anaphylaxis, Other (See Comments)   Stomach cramps, can't eat   Tetanus Toxoid Anaphylaxis, Hives   hives/throat swells shut   Fish Oil Other (See Comments)   Stomach cramps, can't eat   Ibuprofen Nausea Only, Other (See Comments)    Stomach pains   Naproxen Diarrhea   Glucosamine-chondroitin Other (See Comments)   Cramps   Ibuprofen Other (See Comments)   Stomach cramps   Morphine And Related    Confusion   Naproxen Other (See Comments)   Stomach cramps   Sulfa Antibiotics Other (See Comments)   Unknown- "highly allergic" per family.       Medication List    TAKE these medications   acetaminophen 325 MG tablet Commonly known as: TYLENOL Take 650 mg by mouth 2 (two) times daily as needed for mild pain or headache.   amiodarone 200 MG tablet Commonly known as: PACERONE Take 200 mg by mouth daily.   baclofen 10 MG tablet Commonly known as: LIORESAL Take 1 tablet (10 mg total) by mouth 2 (two) times daily.   cephALEXin 500 MG capsule Commonly known as: KEFLEX Take 1 capsule (500 mg total) by mouth 4 (four) times daily for 3 days.   Cholecalciferol 125 MCG (5000 UT) Tabs Take 1 tablet by mouth daily.   Eliquis 5 MG Tabs tablet Generic drug: apixaban TAKE 1 TABLET(5 MG) BY MOUTH TWICE DAILY What changed: See the new instructions.   ferrous sulfate 325 (65 FE) MG tablet Commonly known as: FeroSul Take 1 tablet (325 mg total) by mouth 2 (two) times daily with a meal.   fluticasone 50 MCG/ACT nasal spray Commonly known as: FLONASE Place 1-2 sprays into both nostrils daily as needed for allergies or rhinitis.   furosemide 40 MG tablet Commonly known as: LASIX Take 1 tablet (40 mg total) by mouth daily for 14 days. What changed: when to take this   irbesartan 150 MG tablet Commonly known as: AVAPRO Take 150 mg by mouth daily.   magnesium oxide 400 MG tablet Commonly known as: MAG-OX Take 1 tablet (400 mg total) by mouth 2 (two) times daily.   meclizine 25 MG tablet Commonly known as: ANTIVERT Take 1 tablet (25 mg total) by mouth 3 (three) times daily as needed for dizziness.   Melatonin 10 MG Tabs Take 1 tablet by mouth at bedtime as needed (Sleep).   metoprolol tartrate 25 MG  tablet Commonly known as: LOPRESSOR Take 1 tablet (25 mg total) by mouth 2 (two) times daily.   oxyCODONE-acetaminophen 5-325 MG tablet Commonly known as: PERCOCET/ROXICET Take 1 tablet by mouth every 6 (six) hours as needed for severe pain.   pantoprazole 40 MG tablet Commonly known as: PROTONIX Take 40 mg by mouth daily.   PARoxetine 40 MG tablet Commonly known as: PAXIL Take 40 mg by mouth at bedtime.   polycarbophil 625 MG tablet Commonly known as: FIBERCON Take 1,250 mg by  mouth at bedtime.   Potassium Chloride ER 20 MEQ Tbcr Take 1 tablet by mouth every Monday, Wednesday, and Friday. Take with furosemide.   predniSONE 20 MG tablet Commonly known as: DELTASONE Take PO 4 tabs daily x 1 day,3 tabs daily x 3 days,2 tabs daily x 3 days,1 tab daily x 2 days then stop.   PreserVision AREDS 2 Caps Take 1 capsule by mouth 2 (two) times daily.   prochlorperazine 10 MG tablet Commonly known as: COMPAZINE Take 1 tablet (10 mg total) by mouth every 6 (six) hours as needed.   rosuvastatin 5 MG tablet Commonly known as: CRESTOR TAKE 1 TABLET BY MOUTH IN  THE MORNING What changed: when to take this   senna-docusate 8.6-50 MG tablet Commonly known as: Senokot-S Take 1 tablet by mouth at bedtime as needed for mild constipation or moderate constipation.   SYSTANE OP Place 2 drops into both eyes 2 (two) times daily as needed (dry eyes).   tamsulosin 0.4 MG Caps capsule Commonly known as: FLOMAX Take 0.4 mg by mouth at bedtime.            Durable Medical Equipment  (From admission, onward)         Start     Ordered   09/23/19 1433  For home use only DME oxygen  Once       Question Answer Comment  Length of Need Lifetime   Mode or (Route) Nasal cannula   Liters per Minute 2   Frequency Continuous (stationary and portable oxygen unit needed)   Oxygen delivery system Gas      09/23/19 1432          Follow-up Information    [facility]  Follow up.   Specialty: [place] [facility]: [facility]       [name], MD Follow up in 1 week(s).   Specialty: Family Medicine Contact information: [place] [address] [phone]        Evans Lance, MD .   Specialty: Cardiology Contact information: [phone] [place] [place] [address] [phone]        Constance Haw, MD .   Specialty: Cardiology Contact information: [address] [place] [address] [phone]              Allergies  Allergen Reactions  . Glucosamine-Chondroitin Anaphylaxis and Other (See Comments)    Stomach cramps, can't eat   . Tetanus Toxoid Anaphylaxis and Hives    hives/throat swells shut   . Fish Oil Other (See Comments)    Stomach cramps, can't eat   . Ibuprofen Nausea Only and Other (See Comments)    Stomach pains   . Naproxen Diarrhea  . Glucosamine-Chondroitin Other (See Comments)    Cramps  . Ibuprofen Other (See Comments)    Stomach cramps  . Morphine And Related     Confusion  . Naproxen Other (See Comments)    Stomach cramps  . Sulfa Antibiotics Other (See Comments)    Unknown- "highly allergic" per family.     The results of significant diagnostics from this hospitalization (including imaging, microbiology, ancillary and laboratory) are listed below for reference.    Microbiology: Recent Results (from the past 240 hour(s))  SARS Coronavirus 2 by RT PCR (hospital order, performed in [facility] hospital lab) Nasopharyngeal Nasopharyngeal Swab     Status: None   Collection Time: 09/17/19  2:48 AM  Specimen: Nasopharyngeal Swab  Result Value Ref Range Status   SARS Coronavirus 2 NEGATIVE NEGATIVE Final    Comment: (NOTE) SARS-CoV-2 target nucleic acids are NOT DETECTED.  The SARS-CoV-2 RNA is generally detectable in upper and lower respiratory specimens during the acute phase of infection. The lowest concentration of  SARS-CoV-2 viral copies this assay can detect is 250 copies / mL. A negative result does not preclude SARS-CoV-2 infection and should not be used as the sole basis for treatment or other patient management decisions.  A negative result may occur with improper specimen collection / handling, submission of specimen other than nasopharyngeal swab, presence of viral mutation(s) within the areas targeted by this assay, and inadequate number of viral copies (<250 copies / mL). A negative result must be combined with clinical observations, patient history, and epidemiological information.  Fact Sheet for Patients:   StrictlyIdeas.no  Fact Sheet for Healthcare Providers: BankingDealers.co.za  This test is not yet approved or  cleared by the [place] FDA and has been authorized for detection and/or diagnosis of SARS-CoV-2 by FDA under an Emergency Use Authorization (EUA).  This EUA will remain in effect (meaning this test can be used) for the duration of the COVID-19 declaration under Section 564(b)(1) of the Act, 21 U.S.C. section 360bbb-3(b)(1), unless the authorization is terminated or revoked sooner.  Performed at [facility], [place] [address], [place], [place] [address]   MRSA PCR Screening     Status: None   Collection Time: 09/19/19 10:00 AM   Specimen: Nasal Mucosa; Nasopharyngeal  Result Value Ref Range Status   MRSA by PCR NEGATIVE NEGATIVE Final    Comment:        The GeneXpert MRSA Assay (FDA approved for NASAL specimens only), is one component of a comprehensive MRSA colonization surveillance program. It is not intended to diagnose MRSA infection nor to guide or monitor treatment for MRSA infections. Performed at [facility], [place] [address] [place], [place] [address]   Urine Culture     Status: None   Collection Time: 09/19/19 12:20 PM   Specimen: Urine, Catheterized  Result Value Ref Range  Status   Specimen Description   Final    URINE, CATHETERIZED Performed at [facility] [address], [place], [place] [address]    Special Requests   Final    NONE Performed at [facility], [place] [address], [place], [place] [address]    Culture   Final    NO GROWTH Performed at [facility], [place] [address], [place], [place] [address]    Report Status 09/20/2019 FINAL  Final  Culture, blood (routine x 2)     Status: None   Collection Time: 09/19/19 12:44 PM   Specimen: BLOOD  Result Value Ref Range Status   Specimen Description   Final    BLOOD LEFT ANTECUBITAL Performed at [facility], [place] [address] [place], [place] [address]    Special Requests   Final    BOTTLES DRAWN AEROBIC ONLY Blood Culture results may not be optimal due to an inadequate volume of blood received in culture bottles Performed at [place] [address] [place], [place] [address]    Culture   Final    NO GROWTH 5 DAYS Performed at [facility], [place] [address], [place], [place] [address]    Report Status 09/24/2019 FINAL  Final  Culture, blood (routine x 2)  Status: None   Collection Time: 09/19/19  1:31 PM   Specimen: BLOOD  Result Value Ref Range Status   Specimen Description   Final    BLOOD LEFT ANTECUBITAL Performed at [facility], [place] [address], [place], [place] [address]    Special Requests   Final    BOTTLES DRAWN AEROBIC AND ANAEROBIC Blood Culture adequate volume Performed at [facility] [place] [address], [place], [place] [address]    Culture   Final    NO GROWTH 5 DAYS Performed at [facility], [place] [address], [place], [place] [address]    Report Status 09/24/2019 FINAL  Final    Procedures/Studies: DG Chest 2 View  Result Date: 09/23/2019 CLINICAL DATA:  79 year old male with history of shortness of breath. EXAM: CHEST - 2 VIEW COMPARISON:  Chest x-ray  09/20/2019. FINDINGS: Low lung volumes. Patchy areas of interstitial prominence throughout the mid to lower lungs bilaterally (right greater than left), similar to the prior study. No consolidative airspace disease. Trace right pleural effusion. No left pleural effusion. No pneumothorax. No pulmonary nodule or mass noted. Pulmonary vasculature and the cardiomediastinal silhouette are within normal limits. Atherosclerosis in the thoracic aorta. Status post bilateral shoulder arthroplasty. IMPRESSION: 1. Low lung volumes with areas of interstitial prominence throughout the mid to lower lungs bilaterally, similar to prior examinations, suggestive of interstitial lung disease. 2. Trace right pleural effusion. 3. Aortic atherosclerosis. Electronically Signed   By: Vinnie Langton M.D.   On: 09/23/2019 12:38   CT Head Wo Contrast  Result Date: 09/02/2019 CLINICAL DATA:  Status post trauma. EXAM: CT HEAD WITHOUT CONTRAST TECHNIQUE: Contiguous axial images were obtained from the base of the skull through the vertex without intravenous contrast. COMPARISON:  Aug 03, 2019 FINDINGS: Brain: There is mild cerebral atrophy with widening of the extra-axial spaces and ventricular dilatation. There are areas of decreased attenuation within the white matter tracts of the supratentorial brain, consistent with microvascular disease changes. A chronic right basal ganglia lacunar infarct is seen. Vascular: No hyperdense vessel or unexpected calcification. Skull: Normal. Negative for fracture or focal lesion. Sinuses/Orbits: There is mild bilateral ethmoid sinus mucosal thickening. Other: None. IMPRESSION: 1. Generalized cerebral atrophy. 2. No acute intracranial abnormality. Electronically Signed   By: Virgina Norfolk M.D.   On: 09/02/2019 22:24   CT Angio Chest PE W/Cm &/Or Wo Cm  Result Date: 09/17/2019 CLINICAL DATA:  Hypoxemia EXAM: CT ANGIOGRAPHY CHEST WITH CONTRAST TECHNIQUE: Multidetector CT imaging of the chest was  performed using the standard protocol during bolus administration of intravenous contrast. Multiplanar CT image reconstructions and MIPs were obtained to evaluate the vascular anatomy. CONTRAST:  43mL OMNIPAQUE IOHEXOL 350 MG/ML SOLN COMPARISON:  07/21/2019 PET-CT FINDINGS: Cardiovascular: Satisfactory opacification of the pulmonary arteries to the segmental level. No evidence of pulmonary embolism. Normal heart size. No pericardial effusion. Multifocal coronary atherosclerosis. Mediastinum/Nodes: Right hilar and subcarinal adenopathy that is improved from prior. A subcarinal node measures 14 mm in short axis compared to 24 mm previously. Lungs/Pleura: Tracheobronchomalacia. Postoperative right lower lobe with sutures along the posterior major fissure. Emphysema. There is no edema, consolidation, effusion, or pneumothorax. Upper Abdomen: Liver lesions described on comparison PET CT are not seen on this study which is essentially a noncontrast phase of the liver. Musculoskeletal: No acute or aggressive finding. Pseudoarticulation between the upper right first 2 ribs. T2 and T3 superior endplate sclerosis and mild height loss which is new from 07/06/2019. Review of the MIP  images confirms the above findings. IMPRESSION: 1. Negative for pulmonary embolism or other acute finding. 2. History of lung cancer with improved right hilar and mediastinal adenopathy. 3. Aortic Atherosclerosis (ICD10-I70.0) and Emphysema (ICD10-J43.9). 4. Interval but nonacute superior endplate fractures at T2 and T3 when compared to July 06, 2019 CT. Electronically Signed   By: Monte Fantasia M.D.   On: 09/17/2019 06:10   DG Chest Port 1 View  Result Date: 09/20/2019 CLINICAL DATA:  Respiratory failure EXAM: PORTABLE CHEST 1 VIEW COMPARISON:  September 19, 2019 FINDINGS: There is atelectatic change in the right mid lung and right base regions. Lungs elsewhere are clear. Heart is borderline prominent, stable, with pulmonary vascularity normal.  No adenopathy. Status post total shoulder replacements bilaterally. IMPRESSION: Atelectatic changes in the right mid lung and right base regions, essentially stable. Lungs otherwise clear. Stable cardiac prominence. Electronically Signed   By: Lowella Grip III M.D.   On: 09/20/2019 07:53   DG Chest Port 1 View  Result Date: 09/19/2019 CLINICAL DATA:  Oxygen desaturation and high BP EXAM: PORTABLE CHEST 1 VIEW COMPARISON:  Chest x-rays dated 09/17/2019 and 04/13/2019. FINDINGS: Study is hypoinspiratory with crowding of the bilateral perihilar and bibasilar bronchovascular markings. Probable associated atelectasis at the LEFT lung base. Lungs otherwise clear. No pleural effusion or pneumothorax is seen. Heart size and mediastinal contours appear stable. IMPRESSION: Low lung volumes. Probable associated atelectasis at the LEFT lung base. No evidence of pneumonia or pulmonary edema. Electronically Signed   By: Franki Cabot M.D.   On: 09/19/2019 09:56   DG Chest Portable 1 View  Result Date: 09/17/2019 CLINICAL DATA:  Dyspnea EXAM: PORTABLE CHEST 1 VIEW COMPARISON:  04/13/2019 FINDINGS: Cardiac shadow is enlarged. Aortic calcifications are again noted. Chronic fibrotic changes are seen bilaterally. No focal confluent infiltrate is seen. Bilateral shoulder replacements are noted. IMPRESSION: Chronic scarring without acute abnormality. Electronically Signed   By: Inez Catalina M.D.   On: 09/17/2019 02:31   DG Shoulder Left  Result Date: 09/02/2019 CLINICAL DATA:  Fall getting out of wheelchair, left shoulder pain. EXAM: LEFT SHOULDER - 2+ VIEW COMPARISON:  None. FINDINGS: Left shoulder arthroplasty in expected alignment. No periprosthetic lucency or fracture. The remainder of the shoulder is intact without acute fracture. No abnormality of the included ribs. IMPRESSION: Left shoulder arthroplasty in expected alignment without complication. No acute fracture. Electronically Signed   By: Keith Rake M.D.    On: 09/02/2019 21:42   ECHOCARDIOGRAM COMPLETE  Result Date: 09/19/2019    ECHOCARDIOGRAM REPORT   Patient Name:   George Vega Date of Exam: 09/19/2019 Medical Rec #:  [id number]        Height:       69.0 in Accession #:    [id number]       Weight:       215.1 lb Date of Birth:  Jul 26, 1940       BSA:          2.131 m Patient Age:    89 years         BP:           88/50 mmHg Patient Gender: M                HR:           73 bpm. Exam Location:  Inpatient Procedure: 2D Echo Indications:    Dyspnea; CHF  History:        Patient has prior history of Echocardiogram examinations,  most                 recent 02/14/2019. CAD, Arrythmias:Atrial Fibrillation; Risk                 Factors:Hypertension.  Sonographer:    Mikki Santee RDCS (AE) Referring Phys: [id number] [place] IMPRESSIONS  1. Left ventricular ejection fraction, by estimation, is 60 to 65%. The left ventricle has normal function. The left ventricle has no regional wall motion abnormalities. Left ventricular diastolic parameters were normal.  2. Right ventricular systolic function is moderately reduced. The right ventricular size is moderately enlarged. There is moderately elevated pulmonary artery systolic pressure.  3. Left atrial size was mildly dilated.  4. The mitral valve is normal in structure. Mild mitral valve regurgitation. No evidence of mitral stenosis.  5. The aortic valve is tricuspid. Aortic valve regurgitation is not visualized. Mild aortic valve sclerosis is present, with no evidence of aortic valve stenosis.  6. The inferior vena cava is normal in size with greater than 50% respiratory variability, suggesting right atrial pressure of 3 mmHg. FINDINGS  Left Ventricle: Left ventricular ejection fraction, by estimation, is 60 to 65%. The left ventricle has normal function. The left ventricle has no regional wall motion abnormalities. The left ventricular internal cavity size was normal in size. There is  no left ventricular  hypertrophy. Left ventricular diastolic parameters were normal. Right Ventricle: The right ventricular size is moderately enlarged. No increase in right ventricular wall thickness. Right ventricular systolic function is moderately reduced. There is moderately elevated pulmonary artery systolic pressure. The tricuspid  regurgitant velocity is 3.18 m/s, and with an assumed right atrial pressure of 8 mmHg, the estimated right ventricular systolic pressure is 38.7 mmHg. Left Atrium: Left atrial size was mildly dilated. Right Atrium: Right atrial size was normal in size. Pericardium: Trivial pericardial effusion is present. Mitral Valve: The mitral valve is normal in structure. There is mild thickening of the mitral valve leaflet(s). There is mild calcification of the mitral valve leaflet(s). Normal mobility of the mitral valve leaflets. Mild mitral annular calcification. Mild mitral valve regurgitation. No evidence of mitral valve stenosis. Tricuspid Valve: The tricuspid valve is normal in structure. Tricuspid valve regurgitation is mild . No evidence of tricuspid stenosis. Aortic Valve: The aortic valve is tricuspid. Aortic valve regurgitation is not visualized. Mild aortic valve sclerosis is present, with no evidence of aortic valve stenosis. Pulmonic Valve: The pulmonic valve was normal in structure. Pulmonic valve regurgitation is not visualized. No evidence of pulmonic stenosis. Aorta: The aortic root is normal in size and structure. Venous: The inferior vena cava is normal in size with greater than 50% respiratory variability, suggesting right atrial pressure of 3 mmHg. IAS/Shunts: No atrial level shunt detected by color flow Doppler.  LEFT VENTRICLE PLAX 2D LVIDd:         5.20 cm  Diastology LVIDs:         3.20 cm  LV e' lateral:   9.46 cm/s LV PW:         1.10 cm  LV E/e' lateral: 10.3 LV IVS:        1.10 cm  LV e' medial:    6.42 cm/s LVOT diam:     2.30 cm  LV E/e' medial:  15.2 LV SV:         75 LV SV Index:    35 LVOT Area:     4.15 cm  RIGHT VENTRICLE RV S prime:  12.20 cm/s TAPSE (M-mode): 0.9 cm LEFT ATRIUM             Index       RIGHT ATRIUM           Index LA diam:        4.20 cm 1.97 cm/m  RA Area:     16.80 cm LA Vol (A2C):   47.9 ml 22.48 ml/m RA Volume:   46.30 ml  21.73 ml/m LA Vol (A4C):   56.5 ml 26.52 ml/m LA Biplane Vol: 56.1 ml 26.33 ml/m  AORTIC VALVE LVOT Vmax:   101.00 cm/s LVOT Vmean:  73.500 cm/s LVOT VTI:    0.180 m  AORTA Ao Root diam: 3.00 cm MITRAL VALVE               TRICUSPID VALVE MV Area (PHT): 3.42 cm    TR Peak grad:   40.4 mmHg MV Decel Time: 222 msec    TR Vmax:        318.00 cm/s MV E velocity: 97.90 cm/s MV A velocity: 96.10 cm/s  SHUNTS MV E/A ratio:  1.02        Systemic VTI:  0.18 m                            Systemic Diam: 2.30 cm Jenkins Rouge MD Electronically signed by Jenkins Rouge MD Signature Date/Time: 09/19/2019/3:02:35 PM    Final     Labs: BNP (last 3 results) Recent Labs    02/14/19 0245 09/17/19 0111 09/20/19 0249  BNP 208.6* 586.1* 258.5*   Basic Metabolic Panel: Recent Labs  Lab 09/19/19 0844 09/20/19 0249 09/22/19 0605 09/23/19 0614 09/24/19 0512  NA 139 136 140 139 142  K 4.6 5.0 4.4 4.3 4.2  CL  --  98 103 104 100  CO2  --  29 25 28 30   GLUCOSE  --  174* 171* 162* 152*  BUN  --  29* 34* 33* 32*  CREATININE  --  1.08 1.01 0.98 0.91  CALCIUM  --  8.6* 8.5* 8.5* 8.6*   Liver Function Tests: No results for input(s): AST, ALT, ALKPHOS, BILITOT, PROT, ALBUMIN in the last 168 hours. No results for input(s): LIPASE, AMYLASE in the last 168 hours. No results for input(s): AMMONIA in the last 168 hours. CBC: Recent Labs  Lab 09/19/19 0844 09/20/19 0249 09/22/19 0605 09/23/19 0614 09/24/19 0512  WBC  --  27.5* 32.8* 27.1* 27.2*  NEUTROABS  --  23.0*  --   --   --   HGB 12.6* 10.1* 10.3* 10.3* 11.3*  HCT 37.0* 31.4* 31.3* 32.3* 36.3*  MCV  --  99.4 97.5 100.0 100.8*  PLT  --  226 292 307 320   Cardiac Enzymes: No results  for input(s): CKTOTAL, CKMB, CKMBINDEX, TROPONINI in the last 168 hours. BNP: Invalid input(s): POCBNP CBG: Recent Labs  Lab 09/20/19 2144 09/21/19 0758 09/21/19 1222 09/21/19 1643  GLUCAP 188* 157* 237* 208*   D-Dimer No results for input(s): DDIMER in the last 72 hours. Hgb A1c No results for input(s): HGBA1C in the last 72 hours. Lipid Profile No results for input(s): CHOL, HDL, LDLCALC, TRIG, CHOLHDL, LDLDIRECT in the last 72 hours. Thyroid function studies No results for input(s): TSH, T4TOTAL, T3FREE, THYROIDAB in the last 72 hours.  Invalid input(s): FREET3 Anemia work up No results for input(s): VITAMINB12, FOLATE, FERRITIN, TIBC, IRON, RETICCTPCT in the last 72 hours. Urinalysis  Component Value Date/Time   COLORURINE YELLOW 09/19/2019 1219   APPEARANCEUR HAZY (A) 09/19/2019 1219   LABSPEC 1.010 09/19/2019 1219   PHURINE 5.0 09/19/2019 1219   GLUCOSEU NEGATIVE 09/19/2019 1219   HGBUR LARGE (A) 09/19/2019 1219   BILIRUBINUR NEGATIVE 09/19/2019 1219   KETONESUR NEGATIVE 09/19/2019 1219   PROTEINUR 30 (A) 09/19/2019 1219   UROBILINOGEN 0.2 02/05/2014 1628   NITRITE NEGATIVE 09/19/2019 1219   LEUKOCYTESUR NEGATIVE 09/19/2019 1219   Sepsis Labs Invalid input(s): PROCALCITONIN,  WBC,  LACTICIDVEN Microbiology Recent Results (from the past 240 hour(s))  SARS Coronavirus 2 by RT PCR (hospital order, performed in [place] hospital lab) Nasopharyngeal Nasopharyngeal Swab     Status: None   Collection Time: 09/17/19  2:48 AM   Specimen: Nasopharyngeal Swab  Result Value Ref Range Status   SARS Coronavirus 2 NEGATIVE NEGATIVE Final    Comment: (NOTE) SARS-CoV-2 target nucleic acids are NOT DETECTED.  The SARS-CoV-2 RNA is generally detectable in upper and lower respiratory specimens during the acute phase of infection. The lowest concentration of SARS-CoV-2 viral copies this assay can detect is 250 copies / mL. A negative result does not preclude SARS-CoV-2  infection and should not be used as the sole basis for treatment or other patient management decisions.  A negative result may occur with improper specimen collection / handling, submission of specimen other than nasopharyngeal swab, presence of viral mutation(s) within the areas targeted by this assay, and inadequate number of viral copies (<250 copies / mL). A negative result must be combined with clinical observations, patient history, and epidemiological information.  Fact Sheet for Patients:   StrictlyIdeas.no  Fact Sheet for Healthcare Providers: BankingDealers.co.za  This test is not yet approved or  cleared by the [place] FDA and has been authorized for detection and/or diagnosis of SARS-CoV-2 by FDA under an Emergency Use Authorization (EUA).  This EUA will remain in effect (meaning this test can be used) for the duration of the COVID-19 declaration under Section 564(b)(1) of the Act, 21 U.S.C. section 360bbb-3(b)(1), unless the authorization is terminated or revoked sooner.  Performed at [facility], [place] [address] [place], [place] [address]   MRSA PCR Screening     Status: None   Collection Time: 09/19/19 10:00 AM   Specimen: Nasal Mucosa; Nasopharyngeal  Result Value Ref Range Status   MRSA by PCR NEGATIVE NEGATIVE Final    Comment:        The GeneXpert MRSA Assay (FDA approved for NASAL specimens only), is one component of a comprehensive MRSA colonization surveillance program. It is not intended to diagnose MRSA infection nor to guide or monitor treatment for MRSA infections. Performed at [facility], [place] [address] [place], [place] [address]   Urine Culture     Status: None   Collection Time: 09/19/19 12:20 PM   Specimen: Urine, Catheterized  Result Value Ref Range Status   Specimen Description   Final    URINE, CATHETERIZED Performed at [place] [address], [place], [place] [address]    Special Requests   Final    NONE Performed at [facility], [place] [address], [place], [place] [address]    Culture   Final    NO GROWTH Performed at [facility], [place] [address] [place], [place] [address]    Report Status 09/20/2019 FINAL  Final  Culture, blood (routine x 2)     Status: None  Collection Time: 09/19/19 12:44 PM   Specimen: BLOOD  Result Value Ref Range Status   Specimen Description   Final    BLOOD LEFT ANTECUBITAL Performed at [facility], [place] [address], [place], [place] [address]    Special Requests   Final    BOTTLES DRAWN AEROBIC ONLY Blood Culture results may not be optimal due to an inadequate volume of blood received in culture bottles Performed at [place] [address] [place], [place] [address]    Culture   Final    NO GROWTH 5 DAYS Performed at [facility], [place] [address], [place], [place] [address]    Report Status 09/24/2019 FINAL  Final  Culture, blood (routine x 2)     Status: None   Collection Time: 09/19/19  1:31 PM   Specimen: BLOOD  Result Value Ref Range Status   Specimen Description   Final    BLOOD LEFT ANTECUBITAL Performed at [facility], [place] [address] [place], [place] [address]    Special Requests   Final    BOTTLES DRAWN AEROBIC AND ANAEROBIC Blood Culture adequate volume Performed at [place] [address], [place], [place] [address]    Culture   Final    NO GROWTH 5 DAYS Performed at [facility], [place] [address] [place], [place] [address]    Report Status 09/24/2019 FINAL  Final     Time coordinating discharge: 35 minutes  SIGNED: Antonieta Pert, MD  [facility] 09/25/2019, 3:55 PM  If 7PM-7AM, please contact night-coverage [email or web address]

## 2019-09-28 ENCOUNTER — Ambulatory Visit: Payer: BLUE CROSS/BLUE SHIELD | Admitting: Physician Assistant

## 2019-09-28 ENCOUNTER — Other Ambulatory Visit: Payer: BLUE CROSS/BLUE SHIELD

## 2019-09-28 ENCOUNTER — Inpatient Hospital Stay: Payer: Medicare Other

## 2019-09-28 ENCOUNTER — Ambulatory Visit: Payer: BLUE CROSS/BLUE SHIELD

## 2019-09-29 ENCOUNTER — Inpatient Hospital Stay: Payer: Medicare Other

## 2019-09-30 ENCOUNTER — Ambulatory Visit: Payer: Medicare Other | Admitting: Physician Assistant

## 2019-09-30 ENCOUNTER — Inpatient Hospital Stay: Payer: Medicare Other

## 2019-10-01 ENCOUNTER — Inpatient Hospital Stay: Payer: Medicare Other

## 2019-10-01 ENCOUNTER — Telehealth: Payer: Self-pay | Admitting: Medical Oncology

## 2019-10-01 ENCOUNTER — Other Ambulatory Visit: Payer: Self-pay

## 2019-10-01 ENCOUNTER — Inpatient Hospital Stay (HOSPITAL_BASED_OUTPATIENT_CLINIC_OR_DEPARTMENT_OTHER): Payer: Medicare Other | Admitting: Internal Medicine

## 2019-10-01 ENCOUNTER — Encounter: Payer: Self-pay | Admitting: Internal Medicine

## 2019-10-01 VITALS — BP 82/58 | HR 69 | Temp 97.0°F | Resp 17 | Ht 69.0 in | Wt 199.3 lb

## 2019-10-01 DIAGNOSIS — C7A1 Malignant poorly differentiated neuroendocrine tumors: Secondary | ICD-10-CM | POA: Diagnosis not present

## 2019-10-01 DIAGNOSIS — I509 Heart failure, unspecified: Secondary | ICD-10-CM | POA: Diagnosis not present

## 2019-10-01 DIAGNOSIS — C3491 Malignant neoplasm of unspecified part of right bronchus or lung: Secondary | ICD-10-CM | POA: Diagnosis not present

## 2019-10-01 DIAGNOSIS — I2510 Atherosclerotic heart disease of native coronary artery without angina pectoris: Secondary | ICD-10-CM | POA: Diagnosis not present

## 2019-10-01 DIAGNOSIS — Z79899 Other long term (current) drug therapy: Secondary | ICD-10-CM | POA: Diagnosis not present

## 2019-10-01 DIAGNOSIS — Z7952 Long term (current) use of systemic steroids: Secondary | ICD-10-CM | POA: Diagnosis not present

## 2019-10-01 DIAGNOSIS — Z9221 Personal history of antineoplastic chemotherapy: Secondary | ICD-10-CM | POA: Diagnosis not present

## 2019-10-01 DIAGNOSIS — C787 Secondary malignant neoplasm of liver and intrahepatic bile duct: Secondary | ICD-10-CM | POA: Diagnosis not present

## 2019-10-01 DIAGNOSIS — R5382 Chronic fatigue, unspecified: Secondary | ICD-10-CM

## 2019-10-01 DIAGNOSIS — Z8673 Personal history of transient ischemic attack (TIA), and cerebral infarction without residual deficits: Secondary | ICD-10-CM | POA: Diagnosis not present

## 2019-10-01 DIAGNOSIS — I110 Hypertensive heart disease with heart failure: Secondary | ICD-10-CM | POA: Diagnosis not present

## 2019-10-01 DIAGNOSIS — I4819 Other persistent atrial fibrillation: Secondary | ICD-10-CM | POA: Diagnosis not present

## 2019-10-01 DIAGNOSIS — Z5111 Encounter for antineoplastic chemotherapy: Secondary | ICD-10-CM | POA: Diagnosis not present

## 2019-10-01 DIAGNOSIS — C3431 Malignant neoplasm of lower lobe, right bronchus or lung: Secondary | ICD-10-CM | POA: Diagnosis not present

## 2019-10-01 DIAGNOSIS — F329 Major depressive disorder, single episode, unspecified: Secondary | ICD-10-CM | POA: Diagnosis not present

## 2019-10-01 DIAGNOSIS — K589 Irritable bowel syndrome without diarrhea: Secondary | ICD-10-CM | POA: Diagnosis not present

## 2019-10-01 DIAGNOSIS — Z7901 Long term (current) use of anticoagulants: Secondary | ICD-10-CM | POA: Diagnosis not present

## 2019-10-01 LAB — CBC WITH DIFFERENTIAL (CANCER CENTER ONLY)
Abs Immature Granulocytes: 0.36 10*3/uL — ABNORMAL HIGH (ref 0.00–0.07)
Basophils Absolute: 0 10*3/uL (ref 0.0–0.1)
Basophils Relative: 0 %
Eosinophils Absolute: 0 10*3/uL (ref 0.0–0.5)
Eosinophils Relative: 0 %
HCT: 44.8 % (ref 39.0–52.0)
Hemoglobin: 14.5 g/dL (ref 13.0–17.0)
Immature Granulocytes: 1 %
Lymphocytes Relative: 7 %
Lymphs Abs: 2 10*3/uL (ref 0.7–4.0)
MCH: 32.1 pg (ref 26.0–34.0)
MCHC: 32.4 g/dL (ref 30.0–36.0)
MCV: 99.1 fL (ref 80.0–100.0)
Monocytes Absolute: 2.3 10*3/uL — ABNORMAL HIGH (ref 0.1–1.0)
Monocytes Relative: 9 %
Neutro Abs: 22.1 10*3/uL — ABNORMAL HIGH (ref 1.7–7.7)
Neutrophils Relative %: 83 %
Platelet Count: 523 10*3/uL — ABNORMAL HIGH (ref 150–400)
RBC: 4.52 MIL/uL (ref 4.22–5.81)
RDW: 15.5 % (ref 11.5–15.5)
WBC Count: 26.8 10*3/uL — ABNORMAL HIGH (ref 4.0–10.5)
nRBC: 0 % (ref 0.0–0.2)

## 2019-10-01 LAB — CMP (CANCER CENTER ONLY)
ALT: 127 U/L — ABNORMAL HIGH (ref 0–44)
AST: 47 U/L — ABNORMAL HIGH (ref 15–41)
Albumin: 3.2 g/dL — ABNORMAL LOW (ref 3.5–5.0)
Alkaline Phosphatase: 165 U/L — ABNORMAL HIGH (ref 38–126)
Anion gap: 10 (ref 5–15)
BUN: 38 mg/dL — ABNORMAL HIGH (ref 8–23)
CO2: 29 mmol/L (ref 22–32)
Calcium: 9.4 mg/dL (ref 8.9–10.3)
Chloride: 101 mmol/L (ref 98–111)
Creatinine: 1.12 mg/dL (ref 0.61–1.24)
GFR, Est AFR Am: >60 mL/min (ref >60)
GFR, Estimated: >60 mL/min (ref >60)
Glucose, Bld: 165 mg/dL — ABNORMAL HIGH (ref 70–99)
Potassium: 4.2 mmol/L (ref 3.5–5.1)
Sodium: 140 mmol/L (ref 135–145)
Total Bilirubin: 0.9 mg/dL (ref 0.3–1.2)
Total Protein: 6.2 g/dL — ABNORMAL LOW (ref 6.5–8.1)

## 2019-10-01 NOTE — Progress Notes (Signed)
Travel reimbursement-Paper work for travel reimbursement given to wife with medical information required and signed by Dr Julien Nordmann.

## 2019-10-01 NOTE — Telephone Encounter (Signed)
Pt referred to [facility].

## 2019-10-01 NOTE — Progress Notes (Signed)
[place] [phone]   [phone]  OFFICE PROGRESS NOTE  [name], MD [place] [address]  DIAGNOSIS: Metastatic non-small cell lung cancer, poorly differentiated carcinoma with neuroendocrine features diagnosed in June 2021 initially diagnosed as stage IB (T2a, N0, M0) non-small cell lung cancer, squamous cell carcinoma presented with right lower lobe lung nodule diagnosed in February 2020.  PRIOR THERAPY: Right VATS with superior segmentectomy on the right lower lobe with lymph node dissection under the care of Dr. Servando Snare on June 02, 2018.  CURRENT THERAPY: Systemic chemotherapy with carboplatin for AUC of 5 on day 1, etoposide 100 mg/M2 on days 1, 2 and 3 as well as Imfinzi 1500 mg IV every 3 weeks with Neulasta support.  Status post 1 cycle.  This treatment was discontinued secondary to intolerance and the patient requested referral to hospice care.  INTERVAL HISTORY: George Vega 78 y.o. male returns to the clinic today for follow-up visit accompanied by his wife.  The patient continues to complain of increasing fatigue and weakness.  He started systemic chemotherapy with carboplatin, etoposide and Imfinz more than i 3 weeks ago.  Unfortunately he has a rough time with the first cycle of the treatment and he was admitted to the hospital with shortness of breath as well as significant wheezing and cough.  He was also found to have lower extremity edema.  The patient had elevated BNP and started on treatment with Lasix.  He was also treated with steroids and bronchodilators secondary to worsening respiratory distress.  During his hospitalization he was seen by the palliative care team and discussion of palliative care and hospice referral started at that time.  He came today for reevaluation and more detailed discussion of his condition.  He denied having any current nausea, vomiting, diarrhea or constipation.  He has no  headache or visual changes.  He indicated today that he would prefer to proceed with hospice at this point.   MEDICAL HISTORY: Past Medical History:  Diagnosis Date  . Arthritis   . Atrial fibrillation, persistent ([facility])    on Eliquis  . Cancer ([facility])    liver  . CHF (congestive heart failure) ([facility])   . Chronic sinusitis   . Coronary artery disease   . Depression   . Difficult intubation    was told with shoulder done 2006-alittle narrow  . 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 Hypertension   . Jejunostomy tube fell out    when asked about this in 04/2017, pt denied ever having a J tube, feeding tube, tubes placed post surgery so ??? veracity of a previous J tube.    . 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 Spastic colon   . Squamous cell carcinoma of lung, stage I, right ([facility]) 05/07/2018   bx 04/29/18; isolated PET uptake in RLL mass  . Stroke (cerebrum) ([facility])   . SVT (supraventricular tachycardia) ([facility])     ALLERGIES:  is allergic to glucosamine-chondroitin, tetanus toxoid, fish oil, ibuprofen, naproxen, glucosamine-chondroitin, ibuprofen, morphine and related, naproxen, and sulfa antibiotics.  MEDICATIONS:  Current Outpatient Medications  Medication Sig Dispense Refill  . acetaminophen (TYLENOL) 325 MG tablet Take 650 mg by mouth 2 (two) times daily as needed for mild pain or headache.    Marland Kitchen amiodarone (PACERONE) 200 MG tablet Take 200 mg by mouth daily.    . baclofen (LIORESAL) 10 MG tablet Take 1 tablet (10 mg  total) by mouth 2 (two) times daily. 30 each 0  . Cholecalciferol 125 MCG (5000 UT) TABS Take 1 tablet by mouth daily.    Marland Kitchen ELIQUIS 5 MG TABS tablet TAKE 1 TABLET(5 MG) BY MOUTH TWICE DAILY 180 tablet 1  . ferrous sulfate (FEROSUL) 325 (65 FE) MG tablet Take 1 tablet (325 mg total) by mouth 2 (two) times daily with a meal.    . fluticasone (FLONASE) 50 MCG/ACT nasal spray Place 1-2 sprays into both nostrils daily as needed for allergies or rhinitis.    .  furosemide (LASIX) 40 MG tablet Take 1 tablet (40 mg total) by mouth daily for 14 days. 14 tablet 0  . irbesartan (AVAPRO) 150 MG tablet Take 150 mg by mouth daily.    . magnesium oxide (MAG-OX) 400 MG tablet Take 1 tablet (400 mg total) by mouth 2 (two) times daily. 60 tablet 0  . meclizine (ANTIVERT) 25 MG tablet Take 1 tablet (25 mg total) by mouth 3 (three) times daily as needed for dizziness. 30 tablet 0  . Melatonin 10 MG TABS Take 1 tablet by mouth at bedtime as needed (Sleep).    . metoprolol tartrate (LOPRESSOR) 25 MG tablet Take 1 tablet (25 mg total) by mouth 2 (two) times daily. 180 tablet 3  . Multiple Vitamins-Minerals (PRESERVISION AREDS 2) CAPS Take 1 capsule by mouth 2 (two) times daily.    Marland Kitchen oxyCODONE-acetaminophen (PERCOCET/ROXICET) 5-325 MG tablet Take 1 tablet by mouth every 6 (six) hours as needed for severe pain. 10 tablet 0  . pantoprazole (PROTONIX) 40 MG tablet Take 40 mg by mouth daily.    Marland Kitchen PARoxetine (PAXIL) 40 MG tablet Take 40 mg by mouth at bedtime.    . polycarbophil (FIBERCON) 625 MG tablet Take 1,250 mg by mouth at bedtime.     [name] Glycol-Propyl Glycol (SYSTANE OP) Place 2 drops into both eyes 2 (two) times daily as needed (dry eyes).     . Potassium Chloride ER 20 MEQ TBCR Take 1 tablet by mouth every Monday, Wednesday, and Friday. Take with furosemide.    . predniSONE (DELTASONE) 20 MG tablet Take PO 4 tabs daily x 1 day,3 tabs daily x 3 days,2 tabs daily x 3 days,1 tab daily x 2 days then stop. 20 tablet 0  . prochlorperazine (COMPAZINE) 10 MG tablet Take 1 tablet (10 mg total) by mouth every 6 (six) hours as needed. 30 tablet 2  . rosuvastatin (CRESTOR) 5 MG tablet TAKE 1 TABLET BY MOUTH IN  THE MORNING (Patient taking differently: Take 5 mg by mouth daily. ) 90 tablet 3  . senna-docusate (SENOKOT-S) 8.6-50 MG tablet Take 1 tablet by mouth at bedtime as needed for mild constipation or moderate constipation. 30 tablet 0  . tamsulosin (FLOMAX) 0.4 MG CAPS  capsule Take 0.4 mg by mouth at bedtime.   11   No current facility-administered medications for this visit.    SURGICAL HISTORY:  Past Surgical History:  Procedure Laterality Date  . ATRIAL FIBRILLATION ABLATION N/A 02/11/2019   Procedure: ATRIAL FIBRILLATION ABLATION;  Surgeon: Constance Haw, MD;  Location: [facility];  Service: Cardiovascular;  Laterality: N/A;  . CARDIAC CATHETERIZATION    . CATARACT EXTRACTION, BILATERAL    . COLONOSCOPY WITH PROPOFOL N/A 04/17/2017   Procedure: COLONOSCOPY WITH PROPOFOL;  Surgeon: Yetta Flock, MD;  Location: [facility];  Service: Gastroenterology;  Laterality: N/A;  . ESOPHAGEAL DILATION  2017  . ESOPHAGOGASTRODUODENOSCOPY (EGD) WITH PROPOFOL N/A  04/17/2017   Procedure: ESOPHAGOGASTRODUODENOSCOPY (EGD) WITH PROPOFOL;  Surgeon: Yetta Flock, MD;  Location: [facility];  Service: Gastroenterology;  Laterality: N/A;  . EYE SURGERY    . FOOT NEUROMA SURGERY  2002  . LEFT HEART CATH AND CORONARY ANGIOGRAPHY N/A 01/16/2019   Procedure: LEFT HEART CATH AND CORONARY ANGIOGRAPHY;  Surgeon: Jettie Booze, MD;  Location: [facility];  Service: Cardiovascular;  Laterality: N/A;  . LEFT HEART CATHETERIZATION WITH CORONARY ANGIOGRAM Right 06/14/2011   20% LM, chronic occluded mid LAD, 50% ostial LCX, 20% mid RI, RCA with collaterals to mid LAD, mid 10% stenosis, EF 60% 06/14/11  . NASAL SINUS SURGERY    . SHOULDER ARTHROSCOPY  03/01/2011   Procedure: ARTHROSCOPY SHOULDER;  Surgeon: Ninetta Lights, MD;  Location: [facility];  Service: Orthopedics;  Laterality: Right;  arthroscopy shoulder decompression subacromial partial acromioplasty with coracoacromial release, distal claviculectomy, debridement of labrium  . SHOULDER ARTHROSCOPY  03/01/2011   Procedure: ARTHROSCOPY SHOULDER;  Surgeon: Ninetta Lights, MD;  Location: [facility];  Service: Orthopedics;  Laterality: Right;  arthroscopy shoulder  decompression subacromial partial acromioplasty with coracoacromial release, distal claviculectomy, debridement of labrium  . TEE WITHOUT CARDIOVERSION N/A 03/19/2016   Procedure: TRANSESOPHAGEAL ECHOCARDIOGRAM (TEE);  Surgeon: Larey Dresser, MD;  Location: [facility];  Service: Cardiovascular;  Laterality: N/A;  . TOTAL KNEE ARTHROPLASTY Right 09/23/2013   Procedure: TOTAL KNEE ARTHROPLASTY;  Surgeon: Ninetta Lights, MD;  Location: [facility];  Service: Orthopedics;  Laterality: Right;  . TOTAL SHOULDER ARTHROPLASTY  06/28/2011   Procedure: TOTAL SHOULDER ARTHROPLASTY;  Surgeon: Ninetta Lights, MD;  Location: [facility];  Service: Orthopedics;  Laterality: Right;  . UVULOPALATOPHARYNGOPLASTY    . VIDEO ASSISTED THORACOSCOPY (VATS)/WEDGE RESECTION Right 06/02/2018   Procedure: VIDEO ASSISTED THORACOSCOPY (VATS)/WEDGE RESECTION with Lymph node disection and intercostal nerve block.;  Surgeon: [name], MD;  Location: [facility];  Service: Thoracic;  Laterality: Right;    REVIEW OF SYSTEMS:  Constitutional: positive for anorexia and fatigue Eyes: negative Ears, nose, mouth, throat, and face: negative Respiratory: positive for dyspnea on exertion Cardiovascular: negative Gastrointestinal: negative Genitourinary:negative Integument/breast: negative Hematologic/lymphatic: negative Musculoskeletal:positive for arthralgias and muscle weakness Neurological: negative Behavioral/Psych: negative Endocrine: negative Allergic/Immunologic: negative   PHYSICAL EXAMINATION: General appearance: alert, cooperative, fatigued and no distress Head: Normocephalic, without obvious abnormality, atraumatic Neck: no adenopathy, no JVD, supple, symmetrical, trachea midline and thyroid not enlarged, symmetric, no tenderness/mass/nodules Lymph nodes: Cervical, supraclavicular, and axillary nodes normal. Resp: clear to auscultation bilaterally Back: symmetric, no curvature. ROM normal. No CVA  tenderness. Cardio: regular rate and rhythm, S1, S2 normal, no murmur, click, rub or gallop GI: soft, non-tender; bowel sounds normal; no masses,  no organomegaly Extremities: extremities normal, atraumatic, no cyanosis or edema Neurologic: Alert and oriented X 3, normal strength and tone. Normal symmetric reflexes. Normal coordination and gait  ECOG PERFORMANCE STATUS: 2 - Symptomatic, <50% confined to bed  Blood pressure (!) 82/58, pulse 69, temperature (!) 97 F (36.1 C), resp. rate 17, height 5\' 9"  (1.753 m), weight 199 lb 4.8 oz (90.4 kg), SpO2 98 %.  LABORATORY DATA: Lab Results  Component Value Date   WBC 27.2 (H) 09/24/2019   HGB 11.3 (L) 09/24/2019   HCT 36.3 (L) 09/24/2019   MCV 100.8 (H) 09/24/2019   PLT 320 09/24/2019      Chemistry      Component Value Date/Time   NA 142 09/24/2019 0512  NA 139 02/24/2019 1246   K 4.2 09/24/2019 0512   CL 100 09/24/2019 0512   CO2 30 09/24/2019 0512   BUN 32 (H) 09/24/2019 0512   BUN 18 02/24/2019 1246   CREATININE 0.91 09/24/2019 0512   CREATININE 0.95 09/15/2019 1215      Component Value Date/Time   CALCIUM 8.6 (L) 09/24/2019 0512   ALKPHOS 219 (H) 09/17/2019 0111   AST 58 (H) 09/17/2019 0111   AST 35 09/15/2019 1215   ALT 66 (H) 09/17/2019 0111   ALT 52 (H) 09/15/2019 1215   BILITOT 0.8 09/17/2019 0111   BILITOT 0.5 09/15/2019 1215       RADIOGRAPHIC STUDIES: DG Chest 2 View  Result Date: 09/23/2019 CLINICAL DATA:  79 year old male with history of shortness of breath. EXAM: CHEST - 2 VIEW COMPARISON:  Chest x-ray 09/20/2019. FINDINGS: Low lung volumes. Patchy areas of interstitial prominence throughout the mid to lower lungs bilaterally (right greater than left), similar to the prior study. No consolidative airspace disease. Trace right pleural effusion. No left pleural effusion. No pneumothorax. No pulmonary nodule or mass noted. Pulmonary vasculature and the cardiomediastinal silhouette are within normal limits.  Atherosclerosis in the thoracic aorta. Status post bilateral shoulder arthroplasty. IMPRESSION: 1. Low lung volumes with areas of interstitial prominence throughout the mid to lower lungs bilaterally, similar to prior examinations, suggestive of interstitial lung disease. 2. Trace right pleural effusion. 3. Aortic atherosclerosis. Electronically Signed   By: Vinnie Langton M.D.   On: 09/23/2019 12:38   CT Head Wo Contrast  Result Date: 09/02/2019 CLINICAL DATA:  Status post trauma. EXAM: CT HEAD WITHOUT CONTRAST TECHNIQUE: Contiguous axial images were obtained from the base of the skull through the vertex without intravenous contrast. COMPARISON:  Aug 03, 2019 FINDINGS: Brain: There is mild cerebral atrophy with widening of the extra-axial spaces and ventricular dilatation. There are areas of decreased attenuation within the white matter tracts of the supratentorial brain, consistent with microvascular disease changes. A chronic right basal ganglia lacunar infarct is seen. Vascular: No hyperdense vessel or unexpected calcification. Skull: Normal. Negative for fracture or focal lesion. Sinuses/Orbits: There is mild bilateral ethmoid sinus mucosal thickening. Other: None. IMPRESSION: 1. Generalized cerebral atrophy. 2. No acute intracranial abnormality. Electronically Signed   By: Virgina Norfolk M.D.   On: 09/02/2019 22:24   CT Angio Chest PE W/Cm &/Or Wo Cm  Result Date: 09/17/2019 CLINICAL DATA:  Hypoxemia EXAM: CT ANGIOGRAPHY CHEST WITH CONTRAST TECHNIQUE: Multidetector CT imaging of the chest was performed using the standard protocol during bolus administration of intravenous contrast. Multiplanar CT image reconstructions and MIPs were obtained to evaluate the vascular anatomy. CONTRAST:  2mL OMNIPAQUE IOHEXOL 350 MG/ML SOLN COMPARISON:  07/21/2019 PET-CT FINDINGS: Cardiovascular: Satisfactory opacification of the pulmonary arteries to the segmental level. No evidence of pulmonary embolism. Normal  heart size. No pericardial effusion. Multifocal coronary atherosclerosis. Mediastinum/Nodes: Right hilar and subcarinal adenopathy that is improved from prior. A subcarinal node measures 14 mm in short axis compared to 24 mm previously. Lungs/Pleura: Tracheobronchomalacia. Postoperative right lower lobe with sutures along the posterior major fissure. Emphysema. There is no edema, consolidation, effusion, or pneumothorax. Upper Abdomen: Liver lesions described on comparison PET CT are not seen on this study which is essentially a noncontrast phase of the liver. Musculoskeletal: No acute or aggressive finding. Pseudoarticulation between the upper right first 2 ribs. T2 and T3 superior endplate sclerosis and mild height loss which is new from 07/06/2019. Review of the MIP images confirms the  above findings. IMPRESSION: 1. Negative for pulmonary embolism or other acute finding. 2. History of lung cancer with improved right hilar and mediastinal adenopathy. 3. Aortic Atherosclerosis (ICD10-I70.0) and Emphysema (ICD10-J43.9). 4. Interval but nonacute superior endplate fractures at T2 and T3 when compared to July 06, 2019 CT. Electronically Signed   By: Monte Fantasia M.D.   On: 09/17/2019 06:10   DG Chest Port 1 View  Result Date: 09/20/2019 CLINICAL DATA:  Respiratory failure EXAM: PORTABLE CHEST 1 VIEW COMPARISON:  September 19, 2019 FINDINGS: There is atelectatic change in the right mid lung and right base regions. Lungs elsewhere are clear. Heart is borderline prominent, stable, with pulmonary vascularity normal. No adenopathy. Status post total shoulder replacements bilaterally. IMPRESSION: Atelectatic changes in the right mid lung and right base regions, essentially stable. Lungs otherwise clear. Stable cardiac prominence. Electronically Signed   By: Lowella Grip III M.D.   On: 09/20/2019 07:53   DG Chest Port 1 View  Result Date: 09/19/2019 CLINICAL DATA:  Oxygen desaturation and high BP EXAM: PORTABLE  CHEST 1 VIEW COMPARISON:  Chest x-rays dated 09/17/2019 and 04/13/2019. FINDINGS: Study is hypoinspiratory with crowding of the bilateral perihilar and bibasilar bronchovascular markings. Probable associated atelectasis at the LEFT lung base. Lungs otherwise clear. No pleural effusion or pneumothorax is seen. Heart size and mediastinal contours appear stable. IMPRESSION: Low lung volumes. Probable associated atelectasis at the LEFT lung base. No evidence of pneumonia or pulmonary edema. Electronically Signed   By: Franki Cabot M.D.   On: 09/19/2019 09:56   DG Chest Portable 1 View  Result Date: 09/17/2019 CLINICAL DATA:  Dyspnea EXAM: PORTABLE CHEST 1 VIEW COMPARISON:  04/13/2019 FINDINGS: Cardiac shadow is enlarged. Aortic calcifications are again noted. Chronic fibrotic changes are seen bilaterally. No focal confluent infiltrate is seen. Bilateral shoulder replacements are noted. IMPRESSION: Chronic scarring without acute abnormality. Electronically Signed   By: Inez Catalina M.D.   On: 09/17/2019 02:31   DG Shoulder Left  Result Date: 09/02/2019 CLINICAL DATA:  Fall getting out of wheelchair, left shoulder pain. EXAM: LEFT SHOULDER - 2+ VIEW COMPARISON:  None. FINDINGS: Left shoulder arthroplasty in expected alignment. No periprosthetic lucency or fracture. The remainder of the shoulder is intact without acute fracture. No abnormality of the included ribs. IMPRESSION: Left shoulder arthroplasty in expected alignment without complication. No acute fracture. Electronically Signed   By: Keith Rake M.D.   On: 09/02/2019 21:42   ECHOCARDIOGRAM COMPLETE  Result Date: 09/19/2019    ECHOCARDIOGRAM REPORT   Patient Name:   George Vega [place] Date of Exam: 09/19/2019 Medical Rec #:  [id number]        Height:       69.0 in Accession #:    [id number]       Weight:       215.1 lb Date of Birth:  04-07-1940       BSA:          2.131 m Patient Age:    35 years         BP:           88/50 mmHg Patient Gender: M                 HR:           73 bpm. Exam Location:  Inpatient Procedure: 2D Echo Indications:    Dyspnea; CHF  History:        Patient has prior history of Echocardiogram examinations, most  recent 02/14/2019. CAD, Arrythmias:Atrial Fibrillation; Risk                 Factors:Hypertension.  Sonographer:    Mikki Santee RDCS (AE) Referring Phys: [id number] [place] IMPRESSIONS  1. Left ventricular ejection fraction, by estimation, is 60 to 65%. The left ventricle has normal function. The left ventricle has no regional wall motion abnormalities. Left ventricular diastolic parameters were normal.  2. Right ventricular systolic function is moderately reduced. The right ventricular size is moderately enlarged. There is moderately elevated pulmonary artery systolic pressure.  3. Left atrial size was mildly dilated.  4. The mitral valve is normal in structure. Mild mitral valve regurgitation. No evidence of mitral stenosis.  5. The aortic valve is tricuspid. Aortic valve regurgitation is not visualized. Mild aortic valve sclerosis is present, with no evidence of aortic valve stenosis.  6. The inferior vena cava is normal in size with greater than 50% respiratory variability, suggesting right atrial pressure of 3 mmHg. FINDINGS  Left Ventricle: Left ventricular ejection fraction, by estimation, is 60 to 65%. The left ventricle has normal function. The left ventricle has no regional wall motion abnormalities. The left ventricular internal cavity size was normal in size. There is  no left ventricular hypertrophy. Left ventricular diastolic parameters were normal. Right Ventricle: The right ventricular size is moderately enlarged. No increase in right ventricular wall thickness. Right ventricular systolic function is moderately reduced. There is moderately elevated pulmonary artery systolic pressure. The tricuspid  regurgitant velocity is 3.18 m/s, and with an assumed right atrial pressure of 8 mmHg, the estimated  right ventricular systolic pressure is 50.0 mmHg. Left Atrium: Left atrial size was mildly dilated. Right Atrium: Right atrial size was normal in size. Pericardium: Trivial pericardial effusion is present. Mitral Valve: The mitral valve is normal in structure. There is mild thickening of the mitral valve leaflet(s). There is mild calcification of the mitral valve leaflet(s). Normal mobility of the mitral valve leaflets. Mild mitral annular calcification. Mild mitral valve regurgitation. No evidence of mitral valve stenosis. Tricuspid Valve: The tricuspid valve is normal in structure. Tricuspid valve regurgitation is mild . No evidence of tricuspid stenosis. Aortic Valve: The aortic valve is tricuspid. Aortic valve regurgitation is not visualized. Mild aortic valve sclerosis is present, with no evidence of aortic valve stenosis. Pulmonic Valve: The pulmonic valve was normal in structure. Pulmonic valve regurgitation is not visualized. No evidence of pulmonic stenosis. Aorta: The aortic root is normal in size and structure. Venous: The inferior vena cava is normal in size with greater than 50% respiratory variability, suggesting right atrial pressure of 3 mmHg. IAS/Shunts: No atrial level shunt detected by color flow Doppler.  LEFT VENTRICLE PLAX 2D LVIDd:         5.20 cm  Diastology LVIDs:         3.20 cm  LV e' lateral:   9.46 cm/s LV PW:         1.10 cm  LV E/e' lateral: 10.3 LV IVS:        1.10 cm  LV e' medial:    6.42 cm/s LVOT diam:     2.30 cm  LV E/e' medial:  15.2 LV SV:         75 LV SV Index:   35 LVOT Area:     4.15 cm  RIGHT VENTRICLE RV S prime:     12.20 cm/s TAPSE (M-mode): 0.9 cm LEFT ATRIUM  Index       RIGHT ATRIUM           Index LA diam:        4.20 cm 1.97 cm/m  RA Area:     16.80 cm LA Vol (A2C):   47.9 ml 22.48 ml/m RA Volume:   46.30 ml  21.73 ml/m LA Vol (A4C):   56.5 ml 26.52 ml/m LA Biplane Vol: 56.1 ml 26.33 ml/m  AORTIC VALVE LVOT Vmax:   101.00 cm/s LVOT Vmean:  73.500  cm/s LVOT VTI:    0.180 m  AORTA Ao Root diam: 3.00 cm MITRAL VALVE               TRICUSPID VALVE MV Area (PHT): 3.42 cm    TR Peak grad:   40.4 mmHg MV Decel Time: 222 msec    TR Vmax:        318.00 cm/s MV E velocity: 97.90 cm/s MV A velocity: 96.10 cm/s  SHUNTS MV E/A ratio:  1.02        Systemic VTI:  0.18 m                            Systemic Diam: 2.30 cm Jenkins Rouge MD Electronically signed by Jenkins Rouge MD Signature Date/Time: 09/19/2019/3:02:35 PM    Final     ASSESSMENT AND PLAN: This is a very pleasant 79 years old white male with metastatic non-small cell lung cancer, poorly differentiated carcinoma with neuroendocrine features diagnosed in June 2021 initially diagnosed as stage IB non-small cell lung cancer, squamous cell carcinoma status post superior segmentectomy of the right lower lobe with lymph node dissection in March 2020 under the care of Dr. Servando Snare. The tumor size was 2.7 cm with visceropleural involvement. He was found on recent imaging studies to have evidence for disease metastasis in the liver as well as the lung but no evidence for metastatic disease to the brain. Ultrasound-guided core biopsy of one of the metastatic liver lesion was consistent with metastatic poorly differentiated carcinoma with neuroendocrine features. The patient started on systemic chemotherapy with carboplatin, etoposide and Imfinzi status post 1 cycle but unfortunately he has a rough time after the first cycle of his treatment with significant shortness of breath as well as weakness fatigue and pneumonia. I had a lengthy discussion with the patient and his wife today about his current condition and treatment options. The patient and his wife are more interested on the palliative care option at this point and he would like to be referred to hospice. I ordered the patient his request and we will refer him to the hospice service of [facility]. I will see him on as-needed basis at this point. He was  advised to call immediately if he has any concerning symptoms.  I will also continue to be the attending for his hospice care. The patient voices understanding of current disease status and treatment options and is in agreement with the current care plan.  All questions were answered. The patient knows to call the clinic with any problems, questions or concerns. We can certainly see the patient much sooner if necessary.  Disclaimer: This note was dictated with voice recognition software. Similar sounding words can inadvertently be transcribed and may not be corrected upon review.

## 2019-10-02 ENCOUNTER — Ambulatory Visit: Payer: BLUE CROSS/BLUE SHIELD

## 2019-10-02 LAB — TSH: TSH: 0.371 u[IU]/mL (ref 0.320–4.118)

## 2019-10-05 ENCOUNTER — Other Ambulatory Visit: Payer: BLUE CROSS/BLUE SHIELD

## 2019-10-08 ENCOUNTER — Telehealth: Payer: Self-pay | Admitting: Cardiology

## 2019-10-08 MED ORDER — IRBESARTAN 75 MG PO TABS: 75.0000 mg | ORAL_TABLET | Freq: Every day | ORAL | 2 refills | Status: AC

## 2019-10-08 MED ORDER — METOPROLOL TARTRATE 25 MG PO TABS
12.5000 mg | ORAL_TABLET | Freq: Two times a day (BID) | ORAL | 2 refills | Status: AC
Start: 2019-10-08 — End: 2020-01-06

## 2019-10-08 NOTE — Telephone Encounter (Signed)
Wife returned my call (DPR on file). Ok to speak to hospice RN. Questioning whether they can cut back on the Metoprolol and try halving it per hospice. Reports pt returns to normal dosing of Lasix next week - M/W/F (he has been taking daily for several weeks)  Reviewed w/ [place]. Dr. Curt Bears recommends pt to 1/2 Lopressor to 12.5 mg BID to see if improvement. Also advised to decrease Irbesartan to 75 mg daily.  If BP remains low after decrease, ok to stop Irbesartan.  Advised to call back if further discussion needed at later date on this matter. Aware to make [place] aware if HRs begin to elevate above 100 bpm. Aware not to stop pt's Amiodarone.  Spoke to wife and hospice RN and informed of recommendations. They are agreeable to plan.

## 2019-10-08 NOTE — Telephone Encounter (Addendum)
lmtcb  Also spoke w/ dtr who will relay message to mom (pt is sleeping currently). Advised to contact PCP/oncology now, to further discuss low BP, until such time as Dr. Curt Bears can review chart.  (per dtr pt has less than 10 months)  Being that pt has oncology/hospice involved it may be better for them to advise, but understand they may welcome cardiology recommendation due to AFib. Will forward to [facility] for his review while await return call from pt wife

## 2019-10-08 NOTE — Telephone Encounter (Signed)
New Message   Pt c/o BP issue:  1. What are your last 5 BP readings? 78/52, 77/55 2. Are you having any other symptoms (ex. Dizziness, headache, blurred vision, passed out)? Lightheadness, dizziness 3. What is your medication issue?   [place] nurse with [facility] is calling because of the patient BP. She also states that the patient has been complaining of lightheadness and dizziness. He has only be with them for about 2 days. Please call to discuss.

## 2019-10-13 ENCOUNTER — Ambulatory Visit: Payer: BLUE CROSS/BLUE SHIELD

## 2019-10-13 ENCOUNTER — Other Ambulatory Visit: Payer: BLUE CROSS/BLUE SHIELD

## 2019-10-13 ENCOUNTER — Ambulatory Visit: Payer: BLUE CROSS/BLUE SHIELD | Admitting: Internal Medicine

## 2019-10-14 ENCOUNTER — Ambulatory Visit: Payer: BLUE CROSS/BLUE SHIELD

## 2019-10-15 ENCOUNTER — Ambulatory Visit: Payer: BLUE CROSS/BLUE SHIELD

## 2019-10-16 ENCOUNTER — Ambulatory Visit: Payer: Medicare Other | Admitting: Student

## 2019-10-17 ENCOUNTER — Ambulatory Visit: Payer: BLUE CROSS/BLUE SHIELD

## 2019-10-22 ENCOUNTER — Telehealth: Payer: Self-pay | Admitting: Medical Oncology

## 2019-10-22 NOTE — Telephone Encounter (Signed)
Okay to increase to 30 mg p.o. nightly.  Thank you.

## 2019-10-22 NOTE — Telephone Encounter (Signed)
Insomnia-Request to increase temazepam -pt not sleeping on 15 mg dose.

## 2019-10-23 ENCOUNTER — Other Ambulatory Visit: Payer: Self-pay | Admitting: *Deleted

## 2019-10-23 MED ORDER — PANTOPRAZOLE SODIUM 40 MG PO TBEC: 40.0000 mg | DELAYED_RELEASE_TABLET | Freq: Every day | ORAL | 0 refills | Status: DC

## 2019-10-24 ENCOUNTER — Other Ambulatory Visit: Payer: Self-pay | Admitting: Cardiology

## 2019-10-26 NOTE — Telephone Encounter (Signed)
[name] notified and he will call back if he needs a refill for 30 mg tablets.

## 2019-10-29 ENCOUNTER — Telehealth: Payer: Self-pay | Admitting: Medical Oncology

## 2019-10-29 NOTE — Telephone Encounter (Signed)
Wife notified.

## 2019-10-29 NOTE — Telephone Encounter (Signed)
The foundation 1 results are under molecular in the pathology section.  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  It has nothing to do with his children.  Thank you.

## 2019-10-29 NOTE — Telephone Encounter (Signed)
What were his Foundation one results?  If he has any mutations  would be important for the children to know if there were genetic mutations?

## 2019-11-03 ENCOUNTER — Other Ambulatory Visit: Payer: Self-pay | Admitting: Medical Oncology

## 2019-11-03 ENCOUNTER — Telehealth: Payer: Self-pay | Admitting: Medical Oncology

## 2019-11-03 DIAGNOSIS — C3491 Malignant neoplasm of unspecified part of right bronchus or lung: Secondary | ICD-10-CM

## 2019-11-03 DIAGNOSIS — R42 Dizziness and giddiness: Secondary | ICD-10-CM

## 2019-11-03 MED ORDER — DEXAMETHASONE 4 MG PO TABS: 4.0000 mg | ORAL_TABLET | Freq: Every day | ORAL | 0 refills | Status: DC

## 2019-11-03 NOTE — Telephone Encounter (Signed)
Vertigo not controlled with Meclizine 25 mg tid so Dr  Karie Georges increased dose to 50 mg tid.  Is George Vega okay with adding decadron  4 mg daily ?  I told her yes.

## 2019-11-18 ENCOUNTER — Other Ambulatory Visit: Payer: Self-pay | Admitting: Internal Medicine

## 2019-11-18 DIAGNOSIS — C3491 Malignant neoplasm of unspecified part of right bronchus or lung: Secondary | ICD-10-CM

## 2019-11-25 ENCOUNTER — Other Ambulatory Visit: Payer: Self-pay | Admitting: Cardiology

## 2019-11-27 MED ORDER — AMIODARONE HCL 200 MG PO TABS: 200.0000 mg | ORAL_TABLET | Freq: Every day | ORAL | 2 refills | Status: AC

## 2019-11-27 NOTE — Telephone Encounter (Signed)
Spoke with [employer] [facility] and they stated that they requested two days ago and it was denied because it was to soon. Last refill sent in was 10/26/19. [facility] did not have this on file. Will resent now.

## 2019-12-09 ENCOUNTER — Telehealth: Payer: Self-pay

## 2019-12-09 NOTE — Telephone Encounter (Signed)
Pt expired 01/04/20

## 2019-12-11 DEATH — deceased

## 2019-12-14 ENCOUNTER — Other Ambulatory Visit: Payer: Self-pay | Admitting: Internal Medicine

## 2019-12-15 ENCOUNTER — Ambulatory Visit: Payer: Medicare Other | Admitting: Cardiology

## 2019-12-28 ENCOUNTER — Ambulatory Visit: Admitting: Cardiology
